# Patient Record
Sex: Male | Born: 1935
Health system: Southern US, Community
[De-identification: ages and names within clinical notes are randomized; demographics above are authoritative.]

## PROBLEM LIST (undated history)

## (undated) DIAGNOSIS — E860 Dehydration: Secondary | ICD-10-CM

## (undated) DIAGNOSIS — D591 Autoimmune hemolytic anemia, unspecified: Secondary | ICD-10-CM

## (undated) DIAGNOSIS — E7800 Pure hypercholesterolemia, unspecified: Secondary | ICD-10-CM

## (undated) DIAGNOSIS — D72829 Elevated white blood cell count, unspecified: Secondary | ICD-10-CM

## (undated) DIAGNOSIS — B029 Zoster without complications: Secondary | ICD-10-CM

## (undated) DIAGNOSIS — H3320 Serous retinal detachment, unspecified eye: Secondary | ICD-10-CM

## (undated) DIAGNOSIS — G4700 Insomnia, unspecified: Secondary | ICD-10-CM

## (undated) DIAGNOSIS — I10 Essential (primary) hypertension: Secondary | ICD-10-CM

## (undated) DIAGNOSIS — C61 Malignant neoplasm of prostate: Secondary | ICD-10-CM

## (undated) DIAGNOSIS — D693 Immune thrombocytopenic purpura: Secondary | ICD-10-CM

## (undated) HISTORY — DX: Elevated white blood cell count, unspecified: D72.829

## (undated) HISTORY — DX: Autoimmune hemolytic anemia, unspecified: D59.10

## (undated) HISTORY — DX: Serous retinal detachment, unspecified eye: H33.20

## (undated) HISTORY — PX: OTHER SURGICAL HISTORY: SHX169

## (undated) HISTORY — PX: PROSTATE SURGERY: SHX751

## (undated) HISTORY — DX: Insomnia, unspecified: G47.00

## (undated) HISTORY — PX: EYE SURGERY: SHX253

## (undated) HISTORY — PX: JOINT REPLACEMENT: SHX530

## (undated) HISTORY — PX: APPENDECTOMY: SHX54

## (undated) HISTORY — PX: HERNIA REPAIR: SHX51

## (undated) HISTORY — DX: Other autoimmune hemolytic anemias: D59.1

---

## 2005-05-18 ENCOUNTER — Ambulatory Visit: Payer: Self-pay | Admitting: Unknown Physician Specialty

## 2005-09-06 ENCOUNTER — Ambulatory Visit: Payer: Self-pay | Admitting: Unknown Physician Specialty

## 2005-09-18 ENCOUNTER — Ambulatory Visit: Payer: Self-pay | Admitting: Unknown Physician Specialty

## 2005-10-03 ENCOUNTER — Emergency Department: Payer: Self-pay | Admitting: Emergency Medicine

## 2005-10-28 ENCOUNTER — Emergency Department: Payer: Self-pay | Admitting: Otolaryngology

## 2005-11-24 ENCOUNTER — Ambulatory Visit: Payer: Self-pay | Admitting: Unknown Physician Specialty

## 2007-02-25 ENCOUNTER — Ambulatory Visit: Payer: Self-pay | Admitting: Urology

## 2007-02-28 ENCOUNTER — Ambulatory Visit: Payer: Self-pay | Admitting: Urology

## 2008-01-06 ENCOUNTER — Inpatient Hospital Stay: Payer: Self-pay | Admitting: Internal Medicine

## 2008-03-26 ENCOUNTER — Emergency Department: Payer: Self-pay | Admitting: Emergency Medicine

## 2009-10-06 ENCOUNTER — Inpatient Hospital Stay: Payer: Self-pay | Admitting: Unknown Physician Specialty

## 2009-12-13 ENCOUNTER — Ambulatory Visit: Payer: Self-pay | Admitting: Unknown Physician Specialty

## 2012-06-11 DIAGNOSIS — H3321 Serous retinal detachment, right eye: Secondary | ICD-10-CM | POA: Insufficient documentation

## 2012-06-11 DIAGNOSIS — Z970 Presence of artificial eye: Secondary | ICD-10-CM | POA: Insufficient documentation

## 2012-06-18 ENCOUNTER — Ambulatory Visit: Payer: Self-pay | Admitting: Internal Medicine

## 2012-07-30 DIAGNOSIS — C61 Malignant neoplasm of prostate: Secondary | ICD-10-CM | POA: Insufficient documentation

## 2014-04-09 DIAGNOSIS — S46219A Strain of muscle, fascia and tendon of other parts of biceps, unspecified arm, initial encounter: Secondary | ICD-10-CM

## 2014-04-09 HISTORY — DX: Strain of muscle, fascia and tendon of other parts of biceps, unspecified arm, initial encounter: S46.219A

## 2014-08-27 DIAGNOSIS — E7800 Pure hypercholesterolemia, unspecified: Secondary | ICD-10-CM | POA: Insufficient documentation

## 2014-12-08 ENCOUNTER — Observation Stay: Payer: Self-pay | Admitting: Internal Medicine

## 2014-12-08 LAB — COMPREHENSIVE METABOLIC PANEL
ALK PHOS: 74 U/L
ALT: 23 U/L
Albumin: 3.3 g/dL — ABNORMAL LOW (ref 3.4–5.0)
Anion Gap: 7 (ref 7–16)
BILIRUBIN TOTAL: 0.9 mg/dL (ref 0.2–1.0)
BUN: 17 mg/dL (ref 7–18)
CREATININE: 1.08 mg/dL (ref 0.60–1.30)
Calcium, Total: 8.6 mg/dL (ref 8.5–10.1)
Chloride: 102 mmol/L (ref 98–107)
Co2: 27 mmol/L (ref 21–32)
EGFR (African American): >60
EGFR (Non-African Amer.): >60
Glucose: 122 mg/dL — ABNORMAL HIGH (ref 65–99)
OSMOLALITY: 275 (ref 275–301)
Potassium: 3.2 mmol/L — ABNORMAL LOW (ref 3.5–5.1)
SGOT(AST): 23 U/L (ref 15–37)
SODIUM: 136 mmol/L (ref 136–145)
Total Protein: 6.8 g/dL (ref 6.4–8.2)

## 2014-12-08 LAB — URINALYSIS, COMPLETE
BLOOD: NEGATIVE
Bacteria: NONE SEEN
Bilirubin,UR: NEGATIVE
Glucose,UR: NEGATIVE mg/dL (ref 0–75)
KETONE: NEGATIVE
Leukocyte Esterase: NEGATIVE
NITRITE: NEGATIVE
PH: 7 (ref 4.5–8.0)
PROTEIN: NEGATIVE
RBC,UR: NONE SEEN /HPF (ref 0–5)
SPECIFIC GRAVITY: 1.043 (ref 1.003–1.030)
Squamous Epithelial: NONE SEEN
WBC UR: NONE SEEN /HPF (ref 0–5)

## 2014-12-08 LAB — TROPONIN I
Troponin-I: <0.02 ng/mL
Troponin-I: <0.02 ng/mL

## 2014-12-08 LAB — LIPID PANEL
CHOLESTEROL: 131 mg/dL (ref 0–200)
HDL: 47 mg/dL (ref 40–60)
LDL CHOLESTEROL, CALC: 65 mg/dL (ref 0–100)
Triglycerides: 93 mg/dL (ref 0–200)
VLDL CHOLESTEROL, CALC: 19 mg/dL (ref 5–40)

## 2014-12-08 LAB — CBC
HCT: 35.4 % — ABNORMAL LOW (ref 40.0–52.0)
HGB: 12.2 g/dL — ABNORMAL LOW (ref 13.0–18.0)
MCH: 35.8 pg — AB (ref 26.0–34.0)
MCHC: 34.5 g/dL (ref 32.0–36.0)
MCV: 104 fL — ABNORMAL HIGH (ref 80–100)
Platelet: 61 10*3/uL — ABNORMAL LOW (ref 150–440)
RBC: 3.42 10*6/uL — ABNORMAL LOW (ref 4.40–5.90)
RDW: 12.3 % (ref 11.5–14.5)
WBC: 9.9 10*3/uL (ref 3.8–10.6)

## 2014-12-08 LAB — MAGNESIUM: MAGNESIUM: 2.1 mg/dL

## 2014-12-08 LAB — TSH: Thyroid Stimulating Horm: 0.35 u[IU]/mL — ABNORMAL LOW

## 2014-12-09 LAB — CBC WITH DIFFERENTIAL/PLATELET
Basophil #: 0 10*3/uL (ref 0.0–0.1)
Basophil %: 0.5 %
EOS ABS: 0.1 10*3/uL (ref 0.0–0.7)
Eosinophil %: 1.9 %
HCT: 32.8 % — ABNORMAL LOW (ref 40.0–52.0)
HGB: 11.3 g/dL — AB (ref 13.0–18.0)
Lymphocyte #: 0.9 10*3/uL — ABNORMAL LOW (ref 1.0–3.6)
Lymphocyte %: 12.5 %
MCH: 36.5 pg — AB (ref 26.0–34.0)
MCHC: 34.5 g/dL (ref 32.0–36.0)
MCV: 106 fL — AB (ref 80–100)
MONOS PCT: 8 %
Monocyte #: 0.6 x10 3/mm (ref 0.2–1.0)
NEUTROS PCT: 77.1 %
Neutrophil #: 5.8 10*3/uL (ref 1.4–6.5)
PLATELETS: 55 10*3/uL — AB (ref 150–440)
RBC: 3.09 10*6/uL — ABNORMAL LOW (ref 4.40–5.90)
RDW: 12.4 % (ref 11.5–14.5)
WBC: 7.5 10*3/uL (ref 3.8–10.6)

## 2014-12-09 LAB — BASIC METABOLIC PANEL
Anion Gap: 5 — ABNORMAL LOW (ref 7–16)
BUN: 15 mg/dL (ref 7–18)
CO2: 27 mmol/L (ref 21–32)
CREATININE: 0.88 mg/dL (ref 0.60–1.30)
Calcium, Total: 8.4 mg/dL — ABNORMAL LOW (ref 8.5–10.1)
Chloride: 105 mmol/L (ref 98–107)
GLUCOSE: 95 mg/dL (ref 65–99)
Osmolality: 274 (ref 275–301)
Potassium: 3.5 mmol/L (ref 3.5–5.1)
SODIUM: 137 mmol/L (ref 136–145)

## 2014-12-09 LAB — MAGNESIUM: Magnesium: 1.9 mg/dL

## 2014-12-09 LAB — T4, FREE: FREE THYROXINE: 1.09 ng/dL (ref 0.76–1.46)

## 2014-12-21 ENCOUNTER — Ambulatory Visit: Payer: Self-pay | Admitting: Internal Medicine

## 2014-12-21 DIAGNOSIS — R55 Syncope and collapse: Secondary | ICD-10-CM

## 2014-12-21 HISTORY — DX: Syncope and collapse: R55

## 2015-01-10 ENCOUNTER — Inpatient Hospital Stay: Payer: Self-pay | Admitting: Internal Medicine

## 2015-01-18 ENCOUNTER — Ambulatory Visit: Payer: Self-pay | Admitting: Internal Medicine

## 2015-01-19 ENCOUNTER — Ambulatory Visit
Admit: 2015-01-19 | Disposition: A | Discharge status: Home / Self Care | Payer: Self-pay | Attending: Internal Medicine | Admitting: Internal Medicine

## 2015-02-12 LAB — HEPATIC FUNCTION PANEL A (ARMC)
ALT: 29 U/L
Albumin: 3.3 g/dL — ABNORMAL LOW
Alkaline Phosphatase: 48 U/L
BILIRUBIN TOTAL: 0.5 mg/dL
Bilirubin, Direct: <0.1 mg/dL
SGOT(AST): 24 U/L
Total Protein: 6.3 g/dL — ABNORMAL LOW

## 2015-02-12 LAB — CBC CANCER CENTER
BASOS ABS: 0 x10 3/mm (ref 0.0–0.1)
Basophil %: 0.1 %
Eosinophil #: 0 x10 3/mm (ref 0.0–0.7)
Eosinophil %: 0 %
HCT: 35.6 % — ABNORMAL LOW (ref 40.0–52.0)
HGB: 12.1 g/dL — ABNORMAL LOW (ref 13.0–18.0)
Lymphocyte #: 0.4 x10 3/mm — ABNORMAL LOW (ref 1.0–3.6)
Lymphocyte %: 4.2 %
MCH: 35.1 pg — AB (ref 26.0–34.0)
MCHC: 34.1 g/dL (ref 32.0–36.0)
MCV: 103 fL — ABNORMAL HIGH (ref 80–100)
MONO ABS: 0.1 x10 3/mm — AB (ref 0.2–1.0)
Monocyte %: 0.8 %
NEUTROS PCT: 94.9 %
Neutrophil #: 8.1 x10 3/mm — ABNORMAL HIGH (ref 1.4–6.5)
PLATELETS: 234 x10 3/mm (ref 150–440)
RBC: 3.45 10*6/uL — ABNORMAL LOW (ref 4.40–5.90)
RDW: 13.3 % (ref 11.5–14.5)
WBC: 8.5 x10 3/mm (ref 3.8–10.6)

## 2015-02-12 LAB — BASIC METABOLIC PANEL
Anion Gap: 6 — ABNORMAL LOW (ref 7–16)
BUN: 17 mg/dL
CALCIUM: 8.2 mg/dL — AB
CHLORIDE: 97 mmol/L — AB
CO2: 26 mmol/L
CREATININE: 0.88 mg/dL
EGFR (African American): >60
EGFR (Non-African Amer.): >60
Glucose: 168 mg/dL — ABNORMAL HIGH
POTASSIUM: 4.3 mmol/L
SODIUM: 129 mmol/L — AB

## 2015-02-17 LAB — BASIC METABOLIC PANEL
Anion Gap: 4 — ABNORMAL LOW (ref 7–16)
BUN: 17 mg/dL
CREATININE: 0.8 mg/dL
Calcium, Total: 8.6 mg/dL — ABNORMAL LOW
Chloride: 98 mmol/L — ABNORMAL LOW
Co2: 28 mmol/L
EGFR (African American): >60
EGFR (Non-African Amer.): >60
Glucose: 92 mg/dL
POTASSIUM: 4.2 mmol/L
SODIUM: 130 mmol/L — AB

## 2015-02-17 LAB — CBC CANCER CENTER
BASOS ABS: 0 x10 3/mm (ref 0.0–0.1)
Basophil %: 0.3 %
EOS ABS: 0.1 x10 3/mm (ref 0.0–0.7)
Eosinophil %: 0.8 %
HCT: 35.9 % — ABNORMAL LOW (ref 40.0–52.0)
HGB: 12.6 g/dL — ABNORMAL LOW (ref 13.0–18.0)
Lymphocyte #: 1.9 x10 3/mm (ref 1.0–3.6)
Lymphocyte %: 21.2 %
MCH: 35.6 pg — AB (ref 26.0–34.0)
MCHC: 35.1 g/dL (ref 32.0–36.0)
MCV: 101 fL — AB (ref 80–100)
MONO ABS: 0.7 x10 3/mm (ref 0.2–1.0)
Monocyte %: 8.2 %
NEUTROS ABS: 6.2 x10 3/mm (ref 1.4–6.5)
Neutrophil %: 69.5 %
Platelet: 201 x10 3/mm (ref 150–440)
RBC: 3.54 10*6/uL — ABNORMAL LOW (ref 4.40–5.90)
RDW: 13.1 % (ref 11.5–14.5)
WBC: 9 x10 3/mm (ref 3.8–10.6)

## 2015-02-19 ENCOUNTER — Ambulatory Visit
Admit: 2015-02-19 | Disposition: A | Discharge status: Home / Self Care | Payer: Self-pay | Attending: Internal Medicine | Admitting: Internal Medicine

## 2015-02-24 LAB — BASIC METABOLIC PANEL
Anion Gap: 4 — ABNORMAL LOW (ref 7–16)
BUN: 15 mg/dL
CHLORIDE: 99 mmol/L — AB
CO2: 25 mmol/L
Calcium, Total: 8.3 mg/dL — ABNORMAL LOW
Creatinine: 0.85 mg/dL
EGFR (African American): >60
EGFR (Non-African Amer.): >60
Glucose: 130 mg/dL — ABNORMAL HIGH
POTASSIUM: 3.8 mmol/L
Sodium: 128 mmol/L — ABNORMAL LOW

## 2015-02-24 LAB — CBC CANCER CENTER
BASOS PCT: 0.3 %
Basophil #: 0 x10 3/mm (ref 0.0–0.1)
EOS PCT: 0.6 %
Eosinophil #: 0 x10 3/mm (ref 0.0–0.7)
HCT: 37.2 % — AB (ref 40.0–52.0)
HGB: 12.8 g/dL — AB (ref 13.0–18.0)
Lymphocyte #: 1.9 x10 3/mm (ref 1.0–3.6)
Lymphocyte %: 25.6 %
MCH: 34.9 pg — ABNORMAL HIGH (ref 26.0–34.0)
MCHC: 34.4 g/dL (ref 32.0–36.0)
MCV: 102 fL — AB (ref 80–100)
MONO ABS: 0.6 x10 3/mm (ref 0.2–1.0)
Monocyte %: 8.6 %
NEUTROS PCT: 64.9 %
Neutrophil #: 4.9 x10 3/mm (ref 1.4–6.5)
Platelet: 199 x10 3/mm (ref 150–440)
RBC: 3.67 10*6/uL — AB (ref 4.40–5.90)
RDW: 12.9 % (ref 11.5–14.5)
WBC: 7.5 x10 3/mm (ref 3.8–10.6)

## 2015-03-03 LAB — BASIC METABOLIC PANEL
Anion Gap: 5 — ABNORMAL LOW (ref 7–16)
BUN: 17 mg/dL
CHLORIDE: 100 mmol/L — AB
CREATININE: 0.83 mg/dL
Calcium, Total: 8.5 mg/dL — ABNORMAL LOW
Co2: 27 mmol/L
GLUCOSE: 98 mg/dL
POTASSIUM: 3.8 mmol/L
SODIUM: 132 mmol/L — AB

## 2015-03-03 LAB — CBC CANCER CENTER
BASOS PCT: 0.3 %
Basophil #: 0 x10 3/mm (ref 0.0–0.1)
Eosinophil #: 0.1 x10 3/mm (ref 0.0–0.7)
Eosinophil %: 0.8 %
HCT: 37.8 % — ABNORMAL LOW (ref 40.0–52.0)
HGB: 13 g/dL (ref 13.0–18.0)
LYMPHS ABS: 1.7 x10 3/mm (ref 1.0–3.6)
LYMPHS PCT: 19.5 %
MCH: 34.5 pg — ABNORMAL HIGH (ref 26.0–34.0)
MCHC: 34.4 g/dL (ref 32.0–36.0)
MCV: 100 fL (ref 80–100)
MONO ABS: 0.6 x10 3/mm (ref 0.2–1.0)
MONOS PCT: 7.1 %
Neutrophil #: 6.1 x10 3/mm (ref 1.4–6.5)
Neutrophil %: 72.3 %
PLATELETS: 221 x10 3/mm (ref 150–440)
RBC: 3.77 10*6/uL — ABNORMAL LOW (ref 4.40–5.90)
RDW: 12.9 % (ref 11.5–14.5)
WBC: 8.5 x10 3/mm (ref 3.8–10.6)

## 2015-03-10 LAB — COMPREHENSIVE METABOLIC PANEL
AST: 24 U/L
Albumin: 3.6 g/dL
Alkaline Phosphatase: 43 U/L
Anion Gap: 5 — ABNORMAL LOW (ref 7–16)
BILIRUBIN TOTAL: 0.4 mg/dL
BUN: 16 mg/dL
CREATININE: 0.85 mg/dL
Calcium, Total: 9.1 mg/dL
Chloride: 102 mmol/L
Co2: 30 mmol/L
Glucose: 96 mg/dL
POTASSIUM: 4.8 mmol/L
SGPT (ALT): 20 U/L
Sodium: 137 mmol/L
TOTAL PROTEIN: 6.3 g/dL — AB

## 2015-03-10 LAB — CBC CANCER CENTER
BASOS PCT: 0.2 %
Basophil #: 0 x10 3/mm (ref 0.0–0.1)
Eosinophil #: 0.1 x10 3/mm (ref 0.0–0.7)
Eosinophil %: 0.6 %
HCT: 37.4 % — AB (ref 40.0–52.0)
HGB: 12.8 g/dL — ABNORMAL LOW (ref 13.0–18.0)
LYMPHS PCT: 11.7 %
Lymphocyte #: 1.2 x10 3/mm (ref 1.0–3.6)
MCH: 34.3 pg — ABNORMAL HIGH (ref 26.0–34.0)
MCHC: 34.3 g/dL (ref 32.0–36.0)
MCV: 100 fL (ref 80–100)
MONOS PCT: 4.1 %
Monocyte #: 0.4 x10 3/mm (ref 0.2–1.0)
Neutrophil #: 8.8 x10 3/mm — ABNORMAL HIGH (ref 1.4–6.5)
Neutrophil %: 83.4 %
Platelet: 202 x10 3/mm (ref 150–440)
RBC: 3.75 10*6/uL — AB (ref 4.40–5.90)
RDW: 12.9 % (ref 11.5–14.5)
WBC: 10.6 x10 3/mm (ref 3.8–10.6)

## 2015-03-17 LAB — HEPATIC FUNCTION PANEL A (ARMC)
ALBUMIN: 3.7 g/dL
ALT: 23 U/L
AST: 25 U/L
Alkaline Phosphatase: 44 U/L
Bilirubin, Direct: <0.1 mg/dL
Bilirubin,Total: 0.6 mg/dL
Total Protein: 6.7 g/dL

## 2015-03-17 LAB — BASIC METABOLIC PANEL
ANION GAP: 4 — AB (ref 7–16)
BUN: 17 mg/dL
CO2: 26 mmol/L
Calcium, Total: 8.5 mg/dL — ABNORMAL LOW
Chloride: 101 mmol/L
Creatinine: 0.9 mg/dL
EGFR (African American): >60
EGFR (Non-African Amer.): >60
Glucose: 161 mg/dL — ABNORMAL HIGH
POTASSIUM: 3.9 mmol/L
Sodium: 131 mmol/L — ABNORMAL LOW

## 2015-03-17 LAB — CBC CANCER CENTER
BASOS ABS: 0 x10 3/mm (ref 0.0–0.1)
Basophil %: 0.3 %
EOS ABS: 0 x10 3/mm (ref 0.0–0.7)
Eosinophil %: 0 %
HCT: 38.8 % — ABNORMAL LOW (ref 40.0–52.0)
HGB: 13.4 g/dL (ref 13.0–18.0)
LYMPHS ABS: 0.6 x10 3/mm — AB (ref 1.0–3.6)
Lymphocyte %: 7.3 %
MCH: 34 pg (ref 26.0–34.0)
MCHC: 34.5 g/dL (ref 32.0–36.0)
MCV: 99 fL (ref 80–100)
MONO ABS: 0.1 x10 3/mm — AB (ref 0.2–1.0)
Monocyte %: 1.5 %
Neutrophil #: 7.8 x10 3/mm — ABNORMAL HIGH (ref 1.4–6.5)
Neutrophil %: 90.9 %
PLATELETS: 208 x10 3/mm (ref 150–440)
RBC: 3.94 10*6/uL — ABNORMAL LOW (ref 4.40–5.90)
RDW: 13 % (ref 11.5–14.5)
WBC: 8.6 x10 3/mm (ref 3.8–10.6)

## 2015-03-21 NOTE — Consult Note (Signed)
PATIENT NAME:  [name], [name] MR#:  [id number] DATE OF BIRTH:  [birth year]  DATE OF CONSULTATION:  01/10/2015  REFERRING PHYSICIAN:     Leonie Douglas. Doy Hutching, MD  CONSULTING PHYSICIAN:  Camari Quintanilla R. Ma Hillock, MD  REASON FOR CONSULTATION: Thrombocytopenia.   HISTORY OF PRESENT ILLNESS: The patient is an 81 year old gentleman with a past medical history significant for hypertension, prostate cancer, BPH, hyperlipidemia, who presented to Emergency Room earlier today with complaints of persistent epistaxis. He was found to have severe thrombocytopenia with platelet count down to 2000. Repeat was about 5000. The patient also states that he had noticed some dark stools lately, but no bright red blood in stools. No blood in urine, hemoptysis, hematemesis, hematuria. He has had easy skin bruising, especially on pressure or trauma, also has been taking aspirin. The patient also does take at least 1 drink of vodka every night. He denies any known history of chronic liver disease or splenomegaly in the past. He did have a CT scan of the chest last month for a different reason,  which did not report any obvious abnormality in the liver or spleen. At that time on January 26 platelet count around 55,000 to 65,000, no prior workup has been done before. He denies any new bone pains. No fevers or night sweats. Appetite is good, no unintentional weight loss.   PAST MEDICAL HISTORY AND PAST SURGICAL HISTORY: As in HPI above.   FAMILY HISTORY: Remarkable for hypertension, heart disease. Denies hematological disorders.   SOCIAL HISTORY: Denies smoking, alcohol, or recreational drug usage. Physically active.   HOME MEDICATIONS: Lisinopril 20 mg daily, aspirin 81 mg daily, Flomax 0.4 mg daily, Avodart 0.5 mg daily, pravastatin 20 mg at bedtime.   ALLERGIES: No known drug allergies.   REVIEW OF SYSTEMS: CONSTITUTIONAL: Has some chronic fatigue on exertion. No fevers or chills. No night sweats.  HEENT: No headaches or  dizziness. No ear or jaw pain. No sinus symptoms.  CARDIAC: No angina, palpitations, orthopnea, or PND.  LUNGS: No dyspnea, cough, chest pain, or hemoptysis.  GASTROINTESTINAL: No nausea, vomiting, or diarrhea. Otherwise, as in HPI.  GENITOURINARY: No dysuria or hematuria.  MUSCULOSKELETAL: No new bone pains.   SKIN: As in HPI.  HEMATOLOGIC: As in HPI.  NEUROLOGIC: No new focal weakness, seizures, or loss of consciousness.  ENDOCRINE: No polyuria or polydipsia.   PHYSICAL EXAMINATION:  GENERAL: The patient is a moderately built and nourished individual, resting in bed, has a clamp over the nose for epistaxis. Otherwise, no acute distress and converses appropriately. No icterus. No pallor.  VITAL SIGNS: Temperature 97.9, heart rate 90, respiratory rate 20, blood pressure 154/75, O2 saturation 93% on room air.  HEENT: Normocephalic, atraumatic. Extraocular movements intact. Sclerae anicteric. Having active epistaxis. No oral thrush.  NECK: Negative for lymphadenopathy.  CARDIOVASCULAR: S1 and S2, regular. LUNGS: Show bilateral good breath sounds. No crepitations or rhonchi.  ABDOMEN: Soft. No hepatosplenomegaly clinically.  EXTREMITIES: Minor bruising. No major edema.  NEUROLOGIC: Limited exam. Cranial nerves seem intact. Moves all extremities spontaneously.  MUSCULOSKELETAL: No obvious joint redness or swelling.   LABORATORY DATA: INR 1.1, PTT 32.8. Creatinine 0.7, calcium 8.9. WBC 6200, hemoglobin 11.0, platelets 2000 and repeat was about 5000, MCV elevated at 108. WBC, differential mostly unremarkable except for mild lymphopenia.   IMPRESSION AND RECOMMENDATIONS: An 81 year old gentleman with past medical history as described above, had mild thrombocytopenia in January 2016 with platelet count in the range of 55,000 to 65,000, but no obvious bleeding symptoms.  Now admitted with severe epistaxis. Blood counts today show mild anemia, macrocytosis, severe thrombocytopenia with platelet count  down to 2000. Differential diagnosis is broad, does not have any abnormal PT, PTT to suggest coagulopathy. Possible etiologies include immune thrombocytopenia purpura (ITP) versus occult chronic liver disease versus other etiology like bone marrow disorder, or other etiology.   PLAN: To get further workup, including repeat CBC and differential, reticulocyte count, peripheral smear, B12, folate, iron study, LDH, haptoglobin, serum protein electrophoresis, HBsAg, HCV antibody, HIV antibody, direct Coombs test to look for hemolytic anemia. We will also send Direct platelet antibody test tomorrow since it cannot be sent on a weekend. We will get ultrasound of the abdomen to look for hepatosplenomegaly or cirrhosis of the liver. Given ongoing bleeding issues, we agree with platelet transfusion with steroid cover today and repeat platelet count later today after transfusion is done to assess response to it. The patient may need bone marrow biopsy evaluation later in the week. The patient has been advised to completely avoid taking alcohol, and also to hold aspirin until platelet count consistently improves to greater than 75,000. We will continue to follow.   Thank you for the referral. Please feel free to contact me if any additional questions.    ____________________________ Rhett Bannister Ma Hillock, MD srp:ts D: 01/10/2015 22:12:43 ET T: 01/10/2015 23:07:53 ET JOB#: [id number]  cc: Trevor Iha R. Ma Hillock, MD, <Dictator> Alveta Heimlich MD ELECTRONICALLY SIGNED 01/11/2015 14:53

## 2015-03-21 NOTE — Consult Note (Signed)
PATIENT NAME:  [name], [name] MR#:  [id number] DATE OF BIRTH:  1932-01-03  DATE OF CONSULTATION:  01/10/2015  REQUESTING PHYSICIAN:  Leonie Douglas. Doy Hutching, MD  CONSULTING PHYSICIAN:  Jerene Bears, MD  REASON FOR CONSULTATION: Epistaxis.   HISTORY OF PRESENT ILLNESS: The patient is an 81 year old gentleman who was admitted earlier this morning for thrombocytopenia. He had developed epistaxis last night and this morning and was seen in the Emergency Room and noted to have a platelet count of 2000. He does have a history of chronic anemia and prostate cancer and recently was hospitalized for syncope. He was cauterized twice in the Emergency Room, but now has had a recurrence of his bleeding here on the floor. He is currently being transfused with platelets.   PAST MEDICAL HISTORY: Significant for prostate cancer, hypertension, hyperlipidemia, prostatic hypertrophy and anemia.   ALLERGIES: No known drug allergies.   CURRENT MEDICATIONS:  Tylenol, bacitracin, Avodart, Zestril, Zofran, Protonix, pravastatin, and Flomax.   SOCIAL HISTORY:  Negative for any tobacco or alcohol use.   FAMILY HISTORY: Hypertension, coronary artery disease.  REVIEW OF SYSTEMS:  Positive for nosebleeds, pain in the neck, shoulders, and knees.   PHYSICAL EXAMINATION:  VITAL SIGNS:  Temperature 98.2, pulse 76, respirations 18, blood pressure 154/86, pulse oximetry is 89% on room air.  GENERAL: He is a well-nourished male in no acute distress.  EARS: EACs are clear.  NOSE: There is an epistaxis clamp in place. There is some dried blood from his right nasal cavity.  OROPHARYNX: Reveals some bleeding down the posterior oropharyngeal wall.   NECK: Supple. No lymphadenopathy or thyromegaly.   PROCEDURE:  Epistaxis control.   PREPROCEDURE DIAGNOSIS:  Epistaxis.   POSTPROCEDURE DIAGNOSIS:  Epistaxis.   DESCRIPTION OF PROCEDURE: Verbal consent was obtained. Afrin was sprayed in the patient's nasal cavities  bilaterally, and a 10 cm Merocel sponge was placed in the patient's right nasal cavity and inflated with oxymetazoline. The patient tolerated the procedure well and bleeding ceased and the patient did not have any further bleeding down the posterior oropharynx.   IMPRESSION: Epistaxis, status post packing with a Merocel sponge.   PLAN: The patient will need antibiotics for gram-positive positive coverage while the packing is in place.  I will remove the packing on Thursday of this next week. If the patient is still hospitalized at that time , if you could please re-page me.  If not, please make a followup appointment for Thursday morning for packing removal at [facility]. The patient will require nasal saline sprays 3 times a day as long as the packing is in place as well.   ____________________________ Jerene Bears, MD ccv:LT D: 01/10/2015 16:48:30 ET T: 01/10/2015 17:33:38 ET JOB#: [id number]  cc: Jerene Bears, MD, <Dictator> Jerene Bears MD ELECTRONICALLY SIGNED 02/10/2015 9:54

## 2015-03-21 NOTE — Consult Note (Signed)
PATIENT NAME:  [name], QUAINTANCE MR#:  [id number] DATE OF BIRTH:  Nov 09, 1932  DATE OF CONSULTATION:  12/08/2014  REFERRING PHYSICIAN:   CONSULTING PHYSICIAN:  Leotis Pain, MD  REASON FOR CONSULTATION: Syncope.  HISTORY OF PRESENT ILLNESS: An 81 year old gentleman with past medical history of prostate cancer, hypertension, hyperlipidemia, being admitted status post syncope/presyncopal episode. The patient states he was sitting down, he stood up and felt as he was losing vision, started seeing flashing lights and then near loss of vision and questionable fall. Questionable loss of consciousness possibly for a few seconds and then right away back to his baseline. No seizure-like activity. No tongue biting. No urinary incontinence. The patient has history of previous symptoms in the past described as possibility of vasovagal.  PAST MEDICAL HISTORY: Hypertension, history of prostate cancer, hyperlipidemia, history of recurrent syncope.   ALLERGIES: No known drug allergies.   PAST SURGICAL HISTORY: Right knee replacement.   FAMILY HISTORY: Father died of colorectal cancer at the age of 42.   MEDICATIONS: Include pravastatin, lisinopril, hydrochlorothiazide, Flomax, Avodart.   REVIEW OF SYSTEMS: All negative except history of syncopal episodes and past medical history of hypertension.   LABORATORY DATA: Workup reviewed. Imaging reviewed.   VITAL SIGNS: Reviewed.   NEUROLOGIC EVALUATION: The patient is awake, alert, oriented x 3, able to tell me name, date and time. Speech appears to be fluent. Extraocular movements are intact. Facial sensation intact. Facial motor is intact. Tongue is midline. Uvula elevates symmetrically. Shoulder shrug intact. Coordination: Finger-to-nose intact.   IMPRESSION: An 81 year old gentleman presenting with syncopal-like events. I do not suspect this is seizure activity. The patient has no history of seizures in the past. The patient has history of vasovagal  symptoms.   PLAN: Would obtain blood pressure monitoring lying, standing and sitting. Hydration, adjusting his antihypertensive medication, questionable additional management. No further imaging from a neurological standpoint. This case was discussed with the patient's primary team.    ____________________________ Leotis Pain, MD yz:TT D: 12/08/2014 20:43:08 ET T: 12/08/2014 21:13:42 ET JOB#: [id number]  cc: Leotis Pain, MD, <Dictator> Leotis Pain MD ELECTRONICALLY SIGNED 12/29/2014 12:03

## 2015-03-21 NOTE — Discharge Summary (Signed)
PATIENT NAME:  [name], [name] MR#:  [id number] DATE OF BIRTH:  08/15/1932  DATE OF ADMISSION:  12/08/2014 DATE OF DISCHARGE:  12/10/2014  ADMITTING DIAGNOSES:  1.  Syncope.  2.  Hypertension.  3.  Hyperlipidemia.  4.  Hypokalemia.  5.  History of prostate cancer.   DISCHARGE DIAGNOSES:  1.  Syncope secondary to orthostatic hypotension.  2.  Hypertension.  3.  Hyperlipidemia.  4.  Hypokalemia.  5.  History of prostate cancer.   CONSULTATIONS: Neurology, Leotis Pain, MD.   PROCEDURES: None.  BRIEF HISTORY AND PHYSICAL AND HOSPITAL COURSE: The patient is an 81 year old pleasant male who came into the ED for recurrent syncopal episodes. The patient was feeling dizzy and having blackout spells about 1 or 2 times in a month for several years. He was admitted back in February 2009 and at that time the syncope was thought to be from vasovagal attacks. Please review history and physical for details. He denies any headache or blurry vision at the time of admission. CT scan of the chest in the ED did not reveal any acute pulmonary embolism. The patient was evaluated by neurology, Dr. Irish Elders, regarding recurrent syncopal episodes. He has recommended IV hydration and adjusting his antihypertensive medications and no further workup. Did not recommend any further imaging from neurological standpoint. His EKG did not reveal any acute ST-T wave changes. The patient's orthostatic blood pressures were quite significant. On January 20, his lying blood pressure was 114/73. On standing it was at 121/69. At the time of admission on January 19, lying down 154/68 and which has dropped down to 128/73 on standing posture. With IV fluid, his orthostatic hypotension is resolved. Dizziness is resolved. While holding antihypertensive medications, his blood pressure was stable but elevated on January 21. The patient's hydrochlorothiazide seemed to be causing his orthostatic hypotension and it was discontinued.  Otherwise, hospital course was uneventful.   PHYSICAL EXAMINATION:  VITAL SIGNS: On January 21, temperature 97.5, pulse 78, respirations 18, blood pressure 138/78. Orthostatic blood pressures lying 134/74 with pulse 81; when standing, 145/89 with pulse 94. Pulse oximetry 95% at rest and 98% with exertion.  GENERAL APPEARANCE: Not in acute distress. Moderately built and nourished.  HEENT: Normocephalic, atraumatic. Pupils are equally reacting to light and accommodation. No scleral icterus. No conjunctival injection. No sinus tenderness. No postnasal drip. Moist mucous membranes.  NECK: Supple. No JVD. No thyromegaly. Range of motion is intact.  LUNGS: Clear to auscultation bilaterally. No accessory muscle use and no anterior chest wall tenderness on palpation.  CARDIAC: S1, S2 normal. Regular rate and rhythm. No murmurs.  GASTROINTESTINAL: Soft. Bowel sounds are positive in all 4 quadrants. Nontender, nondistended. No masses. NEUROLOGIC: Awake, alert, oriented x 3. Cranial nerves II-XII are grossly intact. Motor and sensory are intact. Reflexes are 2+.  EXTREMITIES: No edema, cyanosis, no clubbing.  SKIN: Warm to touch. Normal turgor. No rashes. No lesions.  MUSCULOSKELETAL: No joint effusion, tenderness, erythema.  PSYCHIATRIC: Normal mood and affect.  LABORATORY AND IMAGING STUDIES: January 20: BMP is normal except anion gap at 5, magnesium 1.9, total cholesterol 131, triglycerides 93, HDL is 47. LFTs are normal except albumin at 3.3. Troponin at less than 0.02 x 3. TSH 0.350. Free T4 is normal, 1.09. CBC: WBC 7.5, hemoglobin 11.3, hematocrit 32.8, platelet count is 61,000. Urinalysis: Negative nitrites and leukocyte esterase. Glucose negative. CT angiography of the chest: No evidence of acute pulmonary embolism. Opacities at the lung bases likely reflect pulmonary edema. An inflammatory process  is not excluded. Possible right thyroid nodule. Ultrasound is recommended. Carotid Doppler ultrasound:  Calcified plaque at the level of both carotid bulbs and proximal internal carotid arteries, right greater than left. No significant carotid stenosis is identified with estimated bilateral ICA stenosis of less than 50%. Ultrasound of the soft tissues of the head and neck: Normal evaluation by ultrasound of the inferior right thyroid nodule seen by CT due to substernal location of the inferior right thyroid. The visualized part of the thyroid gland is unremarkable by ultrasound.  MEDICATIONS AT THE TIME OF DISCHARGE: Avodart 0.5 mg 1 capsule p.o. once daily, Flomax 0.4 mg 1 capsule p.o. once daily in a.m., lisinopril 20 mg p.o. once daily, pravastatin 20 mg p.o. at bedtime. Hydrochlorothiazide is discontinued.   DIET: Low sodium, regular consistency.   ACTIVITY: As tolerated.  FOLLOWUP: With primary care physician in 1 to 2 weeks and neurology in 1 to 2 weeks.  Plan of care discussed in detail with the patient. He verbalized understanding of the plan.  TOTAL TIME SPENT ON THE DISCHARGE: 45 minutes.   ____________________________ Nicholes Mango, MD ag:ST D: 12/11/2014 19:43:45 ET T: 12/12/2014 02:04:22 ET JOB#: [id number]  cc: Nicholes Mango, MD, <Dictator> Leotis Pain, MD Primary care provider Nicholes Mango MD ELECTRONICALLY SIGNED 12/12/2014 16:54

## 2015-03-21 NOTE — H&P (Signed)
PATIENT NAME:  [name], [name] MR#:  [id number] DATE OF BIRTH:  May 30, 1932  DATE OF ADMISSION:  01/10/2015  REFERRING PHYSICIANS: Dr. Ok Edwards and Dr. Baldemar Lenis.   REASON FOR ADMISSION: Nosebleeds with thrombocytopenia.   HISTORY OF PRESENT ILLNESS: The patient is an 81 year old male with a history of prostate cancer and hypertension who presents to the Emergency Room with nosebleeds. While in the Emergency Room, the patient was noted at the platelet count of 2000. No history of thrombocytopenia. He has a history of chronic anemia and prostate cancer. The bleeding was stopped in the Emergency Room and he is now admitted for further evaluation.   PAST MEDICAL HISTORY: 1.  History of prostate cancer.  2.  Benign hypertension.  3.  Hyperlipidemia.  4.  Benign prostatic hypertrophy.   MEDICATIONS:  1.  Pravastatin 20 mg p.o. at bedtime.  2.  Lisinopril 20 mg p.o. daily.  3.  Aspirin 81 mg p.o. daily.  4.  Flomax 0.4 mg p.o. daily. 5.  Avodart 0.5 mg p.o. daily.   ALLERGIES: No known drug allergies.   SOCIAL HISTORY: Negative for alcohol or tobacco abuse.   FAMILY HISTORY: Positive for hypertension, coronary artery disease, but otherwise unremarkable.   REVIEW OF SYSTEMS: CONSTITUTIONAL: No fever or change in weight.  EYES: No blurred or double vision. No glaucoma.  EARS, NOSE AND THROAT: No tinnitus or hearing loss. Nosebleeds as per history of present illness. No difficulty swallowing.  RESPIRATORY: No cough or wheezing. Denies hemoptysis. No painful respiration.  CARDIOVASCULAR: No chest pain or orthopnea. No palpitations or syncope.  GASTROINTESTINAL: No nausea, vomiting, or diarrhea. No abdominal pain. No change in bowel habits.  GENITOURINARY: No dysuria or hematuria. No incontinence.  ENDOCRINE: No polyuria or polydipsia. No heat or cold intolerance.  HEMATOLOGIC: The patient denies anemia, easy bruising although he has had bleeding as per history of present illness.  LYMPHATIC: No  swollen glands.  MUSCULOSKELETAL: The patient has pain in his neck, back, shoulders, knees, and hips. No gout.  NEUROLOGIC: No numbness or migraines. Denies stroke or seizures.  PSYCHOLOGICAL: The patient denies anxiety, insomnia, or depression.   PHYSICAL EXAMINATION:  GENERAL: The patient is in no acute distress.  VITAL SIGNS: Currently remarkable for a blood pressure of 144/82, heart rate 74, respiratory rate of 18, temperature of 98.1, saturating 96% on room air.  HEENT: Normocephalic, atraumatic. Pupils equally round and reactive to light and accommodation. Extraocular movements are intact. Sclerae are anicteric. Conjunctivae are clear.  OROPHARYNX: Clear.  NECK: Supple without JVD. No adenopathy or thyromegaly is noted.  LUNGS: Clear to auscultation and percussion without wheezes, rales, or rhonchi. No dullness. Respiratory effort is normal.  CARDIAC: Regular rate and rhythm. Normal S1, S2. No significant rubs, murmurs, or gallops. PMI is nondisplaced. Chest wall is nontender.  ABDOMEN: Soft, nontender, with normoactive bowel sounds. No organomegaly or masses were appreciated. No hernias or bruits were noted.  EXTREMITIES: Without clubbing, cyanosis, or edema. Pulses were 2+ bilaterally.  SKIN: Warm and dry without rash or lesions.  NEUROLOGIC: Cranial nerves II-XII grossly intact. Deep tendon reflexes were symmetric. Motor and sensory exam is nonfocal.  PSYCHIATRIC: Revealed a patient who is alert and oriented to person, place, and time. He was cooperative and used good judgment.   LABORATORY DATA: Glucose was 112 with a BUN of 14, creatinine 0.97 with a sodium of 136, and potassium 3.9 with a GFR of greater than 60. His white count was 6.2 with a hemoglobin of 11.1  and a platelet count of 2.   ASSESSMENT:  1.  Thrombocytopenia of unclear etiology.  2.  Nosebleeds. 3.  Prostate cancer.  4.  Benign hypertension.  5.  Hyperlipidemia.   PLAN: The patient will be admitted the floor as  a full code. He will be given IV Solu-Medrol once followed by transfusion of 2 units of platelets as soon as possible. We will consult hematology urgently. He will be kept on a clear liquid diet for now. We will follow closely for signs of other bleeding as his nosebleed has currently stopped. We will continue his outpatient regimen for now. We will obtain an abdominal ultrasound to look at his spleen. Further treatment and evaluation will depend upon the patient's progress.   TOTAL TIME SPENT ON THIS PATIENT: 50 minutes.   ____________________________ Leonie Douglas Doy Hutching, MD jds:mc D: 01/10/2015 12:10:38 ET T: 01/10/2015 12:17:12 ET JOB#: [id number]  cc: Leonie Douglas. Doy Hutching, MD, <Dictator> Derinda Late, MD  Cordie Beazley Lennice Sites MD ELECTRONICALLY SIGNED 01/10/2015 17:41

## 2015-03-21 NOTE — Consult Note (Signed)
HEMATOLOGY followup - no epistaxis today. Denies other new bleeding issues.no fevers. Eating well.sitting in bed, alert and oriented, no acute distress. Pallor +.           vitals - 97.5, 62, 20, 166/83, 92% on room air          HEENT - no oral thrush or petechiae          lungs - bilateral good breath sounds, no creps          abd - soft, NT Hb 8.5, WBC 10000, ANC 8000, platelets 91K, Cr 0.84. Recent Haptoglobin <10, serum LDH elevated at 397, retic 4.6%, ab-retic 0.1331, direct Coombs test positive Serum ferritin 441, serum iron low at 57 but TIBC is low at 246 and iron saturation normal at 23%. Otherwise serum B12, folate, SIEP, HBsAg, HCV Ab, HIV Ab all unremarkable.  Radiology: 01/11/15 - US abdomen. IMPRESSION:  No morphologic findings of cirrhosis. No evidence of hepatosplenomegaly. Trace right pleural effusion. Marrow Biopsy report is pending.  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 Bone marrow biopsy report is pending but preliminarily no acute leukemia, will await final report. Platelet count today is significantly improved at 91K, anemia is steady at 8.5 g hemoglobin. The patient has received IVIG (intravenous immune globulin 400 mg/kg/day x 2 doses on 2/23 and 2/24), also is on IV Solu-Medrol. Given excellent improvement in platelet count and clinically doing study, agree with discharging him home today on prednisone 80 mg by mouth daily. Will follow-up at [facility] on Monday, February 29 with repeat CBC and make further plan of management. In between visits, patient has been advised to call or come to the ER in case of fevers, chills, bleeding, acute sickness or new symptoms. Patient and wife present were explained above details, they are agreeable to this plan.   Electronic Signatures: Jonn Shingles (MD)  (Signed on 25-Feb-16  16:53)  Authored  Last Updated: 25-Feb-16 16:53 by Jonn Shingles (MD)

## 2015-03-21 NOTE — H&P (Signed)
PATIENT NAME:  [name], EDMONSTON MR#:  [id number] DATE OF BIRTH:  22-Sep-1932  DATE OF ADMISSION:  12/08/2014  PRIMARY CARE PHYSICIAN: Derinda Late, MD  EMERGENCY ROOM PHYSICIAN: Algis Liming. Jimmye Norman, MD   CHIEF COMPLAINT: Passing out.   HISTORY OF PRESENT ILLNESS: The patient is an 81 year old male with a known history of prostate cancer, hypertension, and hyperlipidemia, who is being admitted for recurrent syncope/presyncope. The patient reports feeling dizzy and having blackout spell about once or twice a month for several years. He had been admitted back in February 2009 for the same reason, it was thought to be more vasovagal syncope. The patient has been able to function normally, but lately, he has been feeling more dizzy. This morning, he was putting out the garbage can, felt dizzy, went back to bring his recycling garbage to the street, when he almost presyncopized. He lay down on the floor, and the next thing he remembers is his wife standing right next to him. He did hit the garbage can on his shin on his left leg, and he woke up after a few seconds. He denies hitting his head, as he was able to hold on and sit down; although, he does feel that he might have lost a couple of seconds, but he does not recall exactly when. He denies any headache, blurry vision, or any other symptoms associated with this.   PAST MEDICAL HISTORY: 1. Hypertension.  2. History of prostate cancer.  3. Hyperlipidemia.  4. History of recurrent syncope.   ALLERGIES: No known drug allergies.   PAST SURGICAL HISTORY: Right knee replacement.   FAMILY HISTORY: Father died of colorectal cancer at the age of 56. Father had multiple sclerosis also.   SOCIAL HISTORY: No smoking. No alcohol. He lives with his wife. He is not working.   MEDICATIONS AT HOME: 1. Pravastatin 20 mg p.o. daily.  2. Lisinopril 20 mg p.o. daily.  3. Hydrochlorothiazide 25 mg p.o. daily.  4. Flomax 0.4 mg p.o. daily.  5. Avodart 0.5 mg  p.o. daily.   REVIEW OF SYSTEMS:  CONSTITUTIONAL: No fever, fatigue, weakness.  EYES: No blurred or double vision.  EARS, NOSE, AND THROAT: No tinnitus or ear pain.  RESPIRATORY: No cough, wheezing, hemoptysis.  CARDIOVASCULAR: No chest pain, orthopnea, edema.  GASTROINTESTINAL: No nausea, vomiting, diarrhea.  GENITOURINARY: No dysuria or hematuria.  ENDOCRINE: No polyuria or nocturia.  HEMATOLOGY: No anemia or easy bruising.  SKIN: He has minimal laceration on his left shin.  MUSCULOSKELETAL: No joint effusion or tenderness.  GENITOURINARY: History of prostate cancer.  NEUROLOGIC: No tingling, numbness, weakness.  PSYCHIATRIC: No history of anxiety or depression.   PHYSICAL EXAMINATION: VITAL SIGNS: Temperature 97.4, heart rate 57 per minute, respirations 18 per minute, blood pressure 131/72, saturating 95% on room air.  GENERAL: The patient is an 81 year old male lying in the bed comfortably, without any acute distress.  EYES: Pupils equal, round, reactive to light and accommodation. No scleral icterus. Extraocular muscles intact.  HEENT: Head atraumatic, normocephalic. Oropharynx and nasopharynx clear.  NECK: Supple. No jugular venous distention. No thyroid enlargement, no tenderness.  LUNGS: Clear to auscultation bilaterally. No wheezing, rales, rhonchi, crepitation.  CARDIOVASCULAR: S1, S2 normal. No murmurs, rubs, or gallops.  ABDOMEN: Soft, nontender, nondistended. Bowel sounds present, no organomegaly or mass.  EXTREMITIES: No pedal edema, cyanosis, or clubbing.  NEUROLOGIC: Cranial nerves II through XII Intact. Muscle strength 5/5 in all extremities. Sensation intact.  PSYCHIATRIC: The patient is alert and oriented x 3.  SKIN: He has a minimal laceration on his left shin. No bleeding: No signs of infection around MUSCULOSKELETAL: No joint effusion or tenderness.   DIAGNOSTIC STUDIES: EKG showed sinus bradycardia, no acute ST or T changes, heart rate of 57 per minute. Chest  x-ray showed trace bilateral pleural effusion, possible bibasilar atelectasis versus multilobar pneumonia. CT scan chest in the ED showed no acute pulmonary embolism. Possible pulmonary edema, inflammatory process not excluded. Possible right thyroid nodule.   IMPRESSION AND PLAN: 1. Syncope/presyncope, recurrent in nature. We will get orthostatic vitals, obtain TSH, obtain 2D echocardiogram, carotid Dopplers. Consult neurology. Monitor him on telemetry. Check TSH. This is likely vasovagal in nature.  2. Hypertension. Considering his advanced age, we will probably go ahead and hold his blood pressure medication, including lisinopril and hydrochlorothiazide, as this certainly could be contributing to him to feeling dizzy.  3. Hyperlipidemia. We will continue statin. Check fasting lipid profile.  4. Hypokalemia. We will replete and recheck. Check magnesium. Is likely due to him being on diuretic.  5. History of prostate cancer. We will hold avodart and Flomax at this time, as those can be contributing to orthostatic hypotension and him feeling dizzy also.   CODE STATUS: Full code.   TOTAL TIME TAKING CARE OF THIS PATIENT: 45 minutes.    ____________________________ Lucina Mellow. Manuella Ghazi, MD vss:mw D: 12/08/2014 14:49:55 ET T: 12/08/2014 15:25:31 ET JOB#: [id number]  cc: Sivan Quast S. Manuella Ghazi, MD, <Dictator> Derinda Late, MD   Lucina Mellow [facility] MD ELECTRONICALLY SIGNED 12/10/2014 10:14

## 2015-03-21 NOTE — Consult Note (Signed)
HEMATOLOGY followup note - had small epistaxis today. Denies other new bleeding issues.no fevers. Denies new pain.sitting in bed, alert and oriented, no acute distress. Mild pallor.           vitals - 98.2, 64, 20, 122/62, 92% on room air          HEENT - no oral thrush or petechiae          lungs - bilateral good breath sounds, no rhonchi          abd - soft, NT Hb 8.2, WBC 6900, ANC 5600, platelets 2K, Cr 0.88, calcium 8.1. 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 Otherwise serum B12, folate, SIEP, HBsAg, HCV Ab, HIV Ab all unremarkable.  Radiology: 01/11/15 - US abdomen. IMPRESSION:  No morphologic findings of cirrhosis. No evidence of hepatosplenomegaly. Trace right pleural effusion. 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 Bone marrow biopsy done yesterday, report is pending but preliminarily no acute leukemia, we will await final report. Platelet count today has again dropped to 2K and he still has minor epistaxis. Will increase dose of IV Solu-Medrol, start on IVIG (intravenous immune globulin 400 mg/kg dose today, premedicated with Tylenol and Benadryl). Agree with platelet transfusion following IVIG infusion and monitor platelet count. May need to continue on IVIG for few days. Patient otherwise is clinically doing fairly steady. Patient and son present were explained above details, they are agreeable to this plan. Will continue to follow.   Electronic Signatures: Jonn Shingles (MD)  (Signed on 23-Feb-16 21:47)  Authored  Last Updated: 23-Feb-16 21:47 by Jonn Shingles (MD)

## 2015-03-21 NOTE — Discharge Summary (Signed)
PATIENT NAME:  [name], [name] MR#:  [id number] DATE OF BIRTH:  [birth year]  DATE OF ADMISSION:  01/10/2015 DATE OF DISCHARGE:  01/14/2015  DISCHARGE DIAGNOSES: 1. 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 Nasal packing done. 2. Hypertension.  3. Hyperlipidemia.  4. History of prostate cancer.  5. Black stool; need to follow stool guaiac in office in the next 3 to 4 days. If positive, then needs gastroenterology followup in the office.  DISCHARGE MEDICATIONS: 1.  Avodart capsule 0.5 mg oral once a day.  2.  Flomax 0.4 mg once a day.  3.  Lisinopril 20 mg once a day.  4.  Pravastatin 20 mg once a day.  5.  Acetaminophen 325 mg oral 2 tablets every 4 hours as needed for mild pain.  6.  Sodium chloride 2 sprays 4 times a day.  7.  Amoxicillin 500 mg oral tablet for 2 days.  8.  Prednisone 20 mg oral tablet once a day for 14 days.  9.  Zolpidem 5 mg oral tablet once a day as needed for insomnia.   DIET ON DISCHARGE: Low-sodium, low-fat, low-cholesterol, regular consistency diet.   DISCHARGE INSTRUCTIONS: Advised to follow in 1 to 2 days in [place] with Dr. Ma Hillock, and his primary care physician, Dr. Baldemar Lenis.  HISTORY OF PRESENTING ILLNESS: An 81 year old male with history of prostate cancer and hypertension, presented to the Emergency Room with nose bleed. While in the ER, he was noted to have platelet count of 2000. He had nasal packing done in the ER and bleeding stopped by that and was given for admission for further management.     HOSPITAL COURSE AND STAY:  1. Thrombocytopenia - that was guided by Dr. Ma Hillock. He was given 2 transfusions on admission. The next day his platelet count went up to 14,000 and he did not have any active bleeding. We continued monitoring and platelet count went down to 2000 again the next day and so he was started IV IG by hematologist and bone marrow biopsy was done. He was given one more unit of platelets and  his platelet count the next day was 48,000. He was given a second dose of IV IG and then on the fifth day of admission his platelet count was 91,000.  With that platelet count level he was discharged home and advised to follow with the [facility] regularly. 2. Nosebleed. ENT did packing in the nose and after a few days on discharge the packing was removed.  3. Benign hypertension. It was stable in the hospital. 4. Hyperlipidemia. Continued his medications.  5. Black stool. He had gastric ulcers in the past, 5 years ago.    DIAGNOSTIC DATA: On admission platelet count was 2000, WBC 6.2, hemoglobin 11.1. Glucose 112, BUN 14, creatinine 0.97, sodium 136 and potassium 3.9.   Platelet count, as mentioned above, was stable and then gradually started going up . On the 24th of February it was 54 and on the 25th of February it was 48.  TIME SPENT:  Total time spent on discharge was 40 min.  ____________________________ Ceasar Lund. Anselm Jungling, MD vgv:[id number] D: 01/17/2015 21:10:00 ET T: 01/18/2015 07:15:08 ET JOB#: [id number]  cc: Sandeep R. Ma Hillock, MD Derinda Late, MD Vaughan Basta MD ELECTRONICALLY SIGNED 02/01/2015 13:00

## 2015-03-22 DIAGNOSIS — Z862 Personal history of diseases of the blood and blood-forming organs and certain disorders involving the immune mechanism: Secondary | ICD-10-CM

## 2015-03-22 HISTORY — DX: Personal history of diseases of the blood and blood-forming organs and certain disorders involving the immune mechanism: Z86.2

## 2015-03-23 ENCOUNTER — Other Ambulatory Visit: Payer: Self-pay

## 2015-03-23 ENCOUNTER — Encounter: Payer: Self-pay | Admitting: *Deleted

## 2015-03-23 ENCOUNTER — Emergency Department
Admission: EM | Admit: 2015-03-23 | Discharge: 2015-03-24 | Disposition: A | Discharge status: Home / Self Care | Payer: PPO | Attending: Emergency Medicine | Admitting: Emergency Medicine

## 2015-03-23 DIAGNOSIS — R112 Nausea with vomiting, unspecified: Secondary | ICD-10-CM | POA: Insufficient documentation

## 2015-03-23 DIAGNOSIS — R1110 Vomiting, unspecified: Secondary | ICD-10-CM | POA: Diagnosis present

## 2015-03-23 DIAGNOSIS — F419 Anxiety disorder, unspecified: Secondary | ICD-10-CM | POA: Diagnosis not present

## 2015-03-23 DIAGNOSIS — R21 Rash and other nonspecific skin eruption: Secondary | ICD-10-CM | POA: Insufficient documentation

## 2015-03-23 DIAGNOSIS — I10 Essential (primary) hypertension: Secondary | ICD-10-CM | POA: Insufficient documentation

## 2015-03-23 DIAGNOSIS — M79605 Pain in left leg: Secondary | ICD-10-CM | POA: Insufficient documentation

## 2015-03-23 HISTORY — DX: Pure hypercholesterolemia, unspecified: E78.00

## 2015-03-23 HISTORY — DX: Dehydration: E86.0

## 2015-03-23 HISTORY — DX: Zoster without complications: B02.9

## 2015-03-23 HISTORY — DX: Immune thrombocytopenic purpura: D69.3

## 2015-03-23 HISTORY — DX: Essential (primary) hypertension: I10

## 2015-03-23 HISTORY — DX: Malignant neoplasm of prostate: C61

## 2015-03-23 LAB — CBC WITH DIFFERENTIAL/PLATELET
BASOS ABS: 0 10*3/uL (ref 0–0.1)
Basophils Relative: 0 %
EOS ABS: 0 10*3/uL (ref 0–0.7)
Eosinophils Relative: 0 %
HCT: 41.7 % (ref 40.0–52.0)
Hemoglobin: 14.2 g/dL (ref 13.0–18.0)
Lymphocytes Relative: 8 %
Lymphs Abs: 0.8 10*3/uL — ABNORMAL LOW (ref 1.0–3.6)
MCH: 33.8 pg (ref 26.0–34.0)
MCHC: 34.1 g/dL (ref 32.0–36.0)
MCV: 99 fL (ref 80.0–100.0)
Monocytes Absolute: 0.6 10*3/uL (ref 0.2–1.0)
Monocytes Relative: 6 %
Neutro Abs: 8.4 10*3/uL — ABNORMAL HIGH (ref 1.4–6.5)
PLATELETS: 161 10*3/uL (ref 150–440)
RBC: 4.21 MIL/uL — AB (ref 4.40–5.90)
RDW: 12.7 % (ref 11.5–14.5)
WBC: 9.7 10*3/uL (ref 3.8–10.6)

## 2015-03-23 MED ORDER — SODIUM CHLORIDE 0.9 % IV SOLN
1000.0000 mL | Freq: Once | INTRAVENOUS | Status: AC
  Administered 2015-03-23: 1000 mL via INTRAVENOUS

## 2015-03-23 NOTE — ED Notes (Signed)
Pt developed shingles on Sunday night saw pcp on Monday and started meds on Monday. Pt then developed nausea and vomiting today pt talked with md and told to come to the ed for eval. Pt does have history of ITP. Vomit this afternoon was dark and there was concern for blood being present

## 2015-03-23 NOTE — ED Provider Notes (Signed)
[facility] Emergency Department Provider Note    ____________________________________________  Time seen: 2310  I have reviewed the triage vital signs and the nursing notes.   HISTORY  Chief Complaint Nausea; Emesis; and Herpes Zoster      HPI [name]. is a 81 y.o. male who comes in today with dark vomiting. The patient reports that months ago he was diagnosed with ITP after having some low platelet counts. The patient reports that he developed shingles and saw his primary care physician and was given medication for the shingles yesterday. The patient reports that today he has been feeling very bad. He has had multiple episodes of vomiting. He denies any episodes of diarrhea or abdominal pain. The patient's wife reports that he has been dizzy. He was given some Zofran but at approximately 1815 the patient had a large amount of emesis that was dark brown in color. Given the patient's history of ITP the patient's wife called Dr. Ma Hillock and was told to come into the emergency department for further evaluation. The patient reports that he has not drank or eaten much today. For his shingles he was given valacyclovir and he has been on prednisone since February. The patient reports he took a pain medication yesterday and is unsure if that had anything to do with the vomiting. The patient denies any other sick contacts. The patient reports that nothing has made his nausea and vomiting better or worse. The patient reports he is in a 0 out of 10 pain. The patient reports that this morning he ate cereal and vomited all back up. He has not had any fever or chest pain. He denies any weakness or body aches. The patient is here for further evaluation.     Past Medical History  Diagnosis Date  . ITP (idiopathic thrombocytopenic purpura)   . Shingles   . Dehydration   . Hypertension   . Prostate cancer   . Hypercholesteremia     There are no active problems to  display for this patient.   Past Surgical History  Procedure Laterality Date  . Hernia repair    . Eye surgery    . Artificial left eye    . Joint replacement      No current outpatient prescriptions on file.  Allergies Review of patient's allergies indicates no known allergies.  History reviewed. No pertinent family history.  Social History History  Substance Use Topics  . Smoking status: Never Smoker   . Smokeless tobacco: Not on file  . Alcohol Use: No    Review of Systems  Constitutional: Negative for fever. Eyes: Negative for visual changes. ENT: Negative for sore throat. Cardiovascular: Negative for chest pain. Respiratory: Negative for shortness of breath. Gastrointestinal: Positive for vomiting but negative for abdominal pain and diarrhea. Genitourinary: Negative for dysuria. Musculoskeletal: Negative for back pain but positive for left leg pain yesterday. Skin: Positive for rash in dermatomal distribution on the left chest consistent with shingles. Neurological: Positive for dizziness but negative for headaches, focal weakness or numbness. Psychiatric: Positive for anxiety   10-point ROS otherwise negative.  ____________________________________________   PHYSICAL EXAM:  VITAL SIGNS: ED Triage Vitals  Enc Vitals Group     BP 03/23/15 2043 131/81 mmHg     Pulse Rate 03/23/15 2043 80     Resp 03/23/15 2257 18     Temp 03/23/15 2043 97.9 F (36.6 C)     Temp Source 03/23/15 2043 Oral  SpO2 03/23/15 2043 97 %     Weight 03/23/15 2043 165 lb (74.844 kg)     Height 03/23/15 2043 5\' 9"  (1.753 m)     Head Cir --      Peak Flow --      Pain Score --      Pain Loc --      Pain Edu? --      Excl. in [place]? --      Constitutional: Alert and oriented. Well appearing and in no distress. Eyes: Conjunctivae are normal. PERRL. Normal extraocular movements. ENT   Head: Normocephalic and atraumatic.   Nose: No congestion/rhinnorhea.    Mouth/Throat: Mucous membranes are moist.   Neck: No stridor. Hematological/Lymphatic/Immunilogical: No cervical lymphadenopathy. Cardiovascular: Normal rate, regular rhythm. Normal and symmetric distal pulses are present in all extremities. No murmurs, rubs, or gallops. Respiratory: Normal respiratory effort without tachypnea nor retractions. Breath sounds are clear and equal bilaterally. No wheezes/rales/rhonchi. Gastrointestinal: Soft and nontender. No distention. No abdominal bruits. There is no CVA tenderness. Genitourinary: Deferred Rectal: Nontender light brown stool no gross blood Hemoccult negative control negative Musculoskeletal: Nontender with normal range of motion in all extremities. No joint effusions.  No lower extremity tenderness nor edema. Neurologic:  Normal speech and language. No gross focal neurologic deficits are appreciated. Speech is normal. No gait instability. Skin:  Skin is warm, dry and intact. No rash noted. Psychiatric: Mood and affect are normal. Speech and behavior are normal. Patient exhibits appropriate insight and judgment.  ____________________________________________    LABS (pertinent positives/negatives)  Urine with 2+ hemoglobin and trace ketones CBC with platelets of 161 white blood cell count 9.7 hemoglobin 14.2 and hematocrit 41.7 CMP sodium 1:30 chloride 95 glucose 107  ____________________________________________   EKG  ED ECG REPORT   Date: 03/24/2015  EKG Time: 2311  Rate: Normal sinus rhythm  Rhythm: normal EKG, normal sinus rhythm, unchanged from previous tracings  Axis: Normal  Intervals:none  ST&T Change: No ST segment elevation or flipped T waves  ____________________________________________    RADIOLOGY  None  ____________________________________________   PROCEDURES  Procedure(s) performed: None  Critical Care performed: No  ____________________________________________   INITIAL IMPRESSION / ASSESSMENT  AND PLAN / ED COURSE  Pertinent labs & imaging results that were available during my care of the patient were reviewed by me and considered in my medical decision making (see chart for details).  The patient is an 81 year old male who comes in today with 3 episodes of vomiting with no abdominal pain or diarrhea. The patient's physician is concerned as the patient does have a diagnosis of ITP and did have some brown appearing emesis. We will check the patient's blood work and determine if the patient's platelets are low. We will then give the patient some fluids as well as have a by mouth trial and then determine the patient's disposition at this time. The patient is comfortable and in no acute distress.  ----------------------------------------- 1:45 AM on 03/24/2015 -----------------------------------------  The patient received 1 L of normal saline and has blood work that is unremarkable. At this time the patient feels well. His previous blood work shows sodium levels that are similar to the current level. The patient was able to drink some ginger ale and eat crackers without any emesis. I will discharge the patient home to follow up with Dr. Ma Hillock or his primary care physician. The patient and his wife agree with the plan as reviewed for the stated and will continue his treatment for  shingles. ____________________________________________   FINAL CLINICAL IMPRESSION(S) / ED DIAGNOSES  Final diagnoses:  Non-intractable vomiting with nausea, vomiting of unspecified type     Loney Hering, MD 03/24/15 0151

## 2015-03-24 LAB — URINALYSIS COMPLETE WITH MICROSCOPIC (ARMC ONLY)
Bacteria, UA: NONE SEEN
Bilirubin Urine: NEGATIVE
Glucose, UA: NEGATIVE mg/dL
LEUKOCYTES UA: NEGATIVE
Nitrite: NEGATIVE
PH: 7 (ref 5.0–8.0)
Protein, ur: NEGATIVE mg/dL
Specific Gravity, Urine: 1.01 (ref 1.005–1.030)
Squamous Epithelial / LPF: NONE SEEN

## 2015-03-24 LAB — COMPREHENSIVE METABOLIC PANEL
ALBUMIN: 3.7 g/dL (ref 3.5–5.0)
ALK PHOS: 55 U/L (ref 38–126)
ALT: 19 U/L (ref 17–63)
ANION GAP: 9 (ref 5–15)
AST: 22 U/L (ref 15–41)
BILIRUBIN TOTAL: 0.8 mg/dL (ref 0.3–1.2)
BUN: 15 mg/dL (ref 6–20)
CHLORIDE: 95 mmol/L — AB (ref 101–111)
CO2: 26 mmol/L (ref 22–32)
Calcium: 8.8 mg/dL — ABNORMAL LOW (ref 8.9–10.3)
Creatinine, Ser: 0.84 mg/dL (ref 0.61–1.24)
GFR calc Af Amer: >60 mL/min (ref >60)
GFR calc non Af Amer: >60 mL/min (ref >60)
Glucose, Bld: 107 mg/dL — ABNORMAL HIGH (ref 65–99)
POTASSIUM: 3.9 mmol/L (ref 3.5–5.1)
SODIUM: 130 mmol/L — AB (ref 135–145)
Total Protein: 6.8 g/dL (ref 6.5–8.1)

## 2015-03-24 LAB — APTT: aPTT: 32 seconds (ref 24–36)

## 2015-03-24 LAB — PROTIME-INR
INR: 0.95
Prothrombin Time: 12.9 seconds (ref 11.4–15.0)

## 2015-03-24 NOTE — Discharge Instructions (Signed)

## 2015-03-24 NOTE — ED Notes (Signed)
Pt alert and in NAD at time of d/c to wife.

## 2015-03-24 NOTE — ED Notes (Signed)
Pt given crackers and water. 

## 2015-04-02 ENCOUNTER — Emergency Department: Payer: PPO

## 2015-04-02 ENCOUNTER — Emergency Department
Admission: EM | Admit: 2015-04-02 | Discharge: 2015-04-02 | Disposition: A | Discharge status: Home / Self Care | Payer: PPO | Attending: Emergency Medicine | Admitting: Emergency Medicine

## 2015-04-02 ENCOUNTER — Encounter: Payer: Self-pay | Admitting: Emergency Medicine

## 2015-04-02 DIAGNOSIS — I10 Essential (primary) hypertension: Secondary | ICD-10-CM | POA: Diagnosis not present

## 2015-04-02 DIAGNOSIS — Z79899 Other long term (current) drug therapy: Secondary | ICD-10-CM | POA: Insufficient documentation

## 2015-04-02 DIAGNOSIS — Z7952 Long term (current) use of systemic steroids: Secondary | ICD-10-CM | POA: Diagnosis not present

## 2015-04-02 DIAGNOSIS — B029 Zoster without complications: Secondary | ICD-10-CM | POA: Insufficient documentation

## 2015-04-02 DIAGNOSIS — K5900 Constipation, unspecified: Secondary | ICD-10-CM | POA: Diagnosis present

## 2015-04-02 DIAGNOSIS — R55 Syncope and collapse: Secondary | ICD-10-CM | POA: Insufficient documentation

## 2015-04-02 DIAGNOSIS — K567 Ileus, unspecified: Secondary | ICD-10-CM | POA: Insufficient documentation

## 2015-04-02 LAB — URINALYSIS COMPLETE WITH MICROSCOPIC (ARMC ONLY)
BILIRUBIN URINE: NEGATIVE
Bacteria, UA: NONE SEEN
GLUCOSE, UA: NEGATIVE mg/dL
HGB URINE DIPSTICK: NEGATIVE
Ketones, ur: NEGATIVE mg/dL
Leukocytes, UA: NEGATIVE
Nitrite: NEGATIVE
Protein, ur: NEGATIVE mg/dL
RBC / HPF: NONE SEEN RBC/hpf (ref 0–5)
Specific Gravity, Urine: 1.009 (ref 1.005–1.030)
pH: 9 — ABNORMAL HIGH (ref 5.0–8.0)

## 2015-04-02 LAB — CBC
HCT: 40.4 % (ref 40.0–52.0)
HEMOGLOBIN: 14 g/dL (ref 13.0–18.0)
MCH: 33.7 pg (ref 26.0–34.0)
MCHC: 34.5 g/dL (ref 32.0–36.0)
MCV: 97.5 fL (ref 80.0–100.0)
Platelets: 271 10*3/uL (ref 150–440)
RBC: 4.15 MIL/uL — ABNORMAL LOW (ref 4.40–5.90)
RDW: 13.3 % (ref 11.5–14.5)
WBC: 11.7 10*3/uL — AB (ref 3.8–10.6)

## 2015-04-02 LAB — LIPASE, BLOOD: Lipase: 39 U/L (ref 22–51)

## 2015-04-02 LAB — HEPATIC FUNCTION PANEL
ALT: 28 U/L (ref 17–63)
AST: 28 U/L (ref 15–41)
Albumin: 4 g/dL (ref 3.5–5.0)
Alkaline Phosphatase: 59 U/L (ref 38–126)
BILIRUBIN DIRECT: 0.1 mg/dL (ref 0.1–0.5)
BILIRUBIN INDIRECT: 0.6 mg/dL (ref 0.3–0.9)
BILIRUBIN TOTAL: 0.7 mg/dL (ref 0.3–1.2)
TOTAL PROTEIN: 7 g/dL (ref 6.5–8.1)

## 2015-04-02 LAB — BASIC METABOLIC PANEL
ANION GAP: 9 (ref 5–15)
BUN: 15 mg/dL (ref 6–20)
CHLORIDE: 96 mmol/L — AB (ref 101–111)
CO2: 25 mmol/L (ref 22–32)
Calcium: 8.6 mg/dL — ABNORMAL LOW (ref 8.9–10.3)
Creatinine, Ser: 0.89 mg/dL (ref 0.61–1.24)
GFR calc Af Amer: >60 mL/min (ref >60)
GFR calc non Af Amer: >60 mL/min (ref >60)
Glucose, Bld: 105 mg/dL — ABNORMAL HIGH (ref 65–99)
POTASSIUM: 3.3 mmol/L — AB (ref 3.5–5.1)
Sodium: 130 mmol/L — ABNORMAL LOW (ref 135–145)

## 2015-04-02 LAB — TROPONIN I: Troponin I: <0.03 ng/mL (ref <0.031)

## 2015-04-02 MED ORDER — MORPHINE SULFATE 4 MG/ML IJ SOLN
INTRAMUSCULAR | Status: AC
  Filled 2015-04-02: qty 1

## 2015-04-02 MED ORDER — SODIUM CHLORIDE 0.9 % IV BOLUS (SEPSIS)
1000.0000 mL | Freq: Once | INTRAVENOUS | Status: AC
  Administered 2015-04-02: 1000 mL via INTRAVENOUS

## 2015-04-02 MED ORDER — ONDANSETRON HCL 4 MG/2ML IJ SOLN
INTRAMUSCULAR | Status: AC
  Filled 2015-04-02: qty 2

## 2015-04-02 MED ORDER — MORPHINE SULFATE 4 MG/ML IJ SOLN
4.0000 mg | Freq: Once | INTRAMUSCULAR | Status: AC
  Administered 2015-04-02: 4 mg via INTRAVENOUS

## 2015-04-02 MED ORDER — MINERAL OIL RE ENEM: 1.0000 | ENEMA | Freq: Once | RECTAL | Status: DC

## 2015-04-02 MED ORDER — METOCLOPRAMIDE HCL 10 MG PO TABS: 10.0000 mg | ORAL_TABLET | Freq: Three times a day (TID) | ORAL | Status: DC

## 2015-04-02 MED ORDER — IOHEXOL 300 MG/ML  SOLN
100.0000 mL | Freq: Once | INTRAMUSCULAR | Status: AC | PRN
  Administered 2015-04-02: 100 mL via INTRAVENOUS

## 2015-04-02 MED ORDER — ONDANSETRON HCL 4 MG/2ML IJ SOLN
4.0000 mg | Freq: Once | INTRAMUSCULAR | Status: AC
  Administered 2015-04-02: 4 mg via INTRAVENOUS

## 2015-04-02 MED ORDER — DOCUSATE SODIUM 100 MG PO CAPS: 100.0000 mg | ORAL_CAPSULE | Freq: Two times a day (BID) | ORAL | Status: AC

## 2015-04-02 MED ORDER — IOHEXOL 240 MG/ML SOLN: 25.0000 mL | Freq: Once | INTRAMUSCULAR | Status: DC | PRN

## 2015-04-02 NOTE — ED Notes (Signed)
Patient to ED via EMS from home with c/o near syncopal episode while walking back to bed from bathroom. Patient caught himself on wall and has skin tear to right outer elbow. Patient reports history of shingles x2 weeks and 6 month history of low hemoglobin and platelet count. Patient does report dizziness with sitting up or standing.

## 2015-04-02 NOTE — ED Notes (Signed)
Pt transported to CT ?

## 2015-04-02 NOTE — Discharge Instructions (Signed)
Constipation °Constipation is when a person has fewer than three bowel movements a week, has difficulty having a bowel movement, or has stools that are dry, hard, or larger than normal. As people grow older, constipation is more common. If you try to fix constipation with medicines that make you have a bowel movement (laxatives), the problem may get worse. Long-term laxative use may cause the muscles of the colon to become weak. A low-fiber diet, not taking in enough fluids, and taking certain medicines may make constipation worse.  °CAUSES  °· Certain medicines, such as antidepressants, pain medicine, iron supplements, antacids, and water pills.   °· Certain diseases, such as diabetes, irritable bowel syndrome (IBS), thyroid disease, or depression.   °· Not drinking enough water.   °· Not eating enough fiber-rich foods.   °· Stress or travel.   °· Lack of physical activity or exercise.   °· Ignoring the urge to have a bowel movement.   °· Using laxatives too much.   °SIGNS AND SYMPTOMS  °· Having fewer than three bowel movements a week.   °· Straining to have a bowel movement.   °· Having stools that are hard, dry, or larger than normal.   °· Feeling full or bloated.   °· Pain in the lower abdomen.   °· Not feeling relief after having a bowel movement.   °DIAGNOSIS  °Your health care provider will take a medical history and perform a physical exam. Further testing may be done for severe constipation. Some tests may include: °· A barium enema X-ray to examine your rectum, colon, and, sometimes, your small intestine.   °· A sigmoidoscopy to examine your lower colon.   °· A colonoscopy to examine your entire colon. °TREATMENT  °Treatment will depend on the severity of your constipation and what is causing it. Some dietary treatments include drinking more fluids and eating more fiber-rich foods. Lifestyle treatments may include regular exercise. If these diet and lifestyle recommendations do not help, your health care  provider may recommend taking over-the-counter laxative medicines to help you have bowel movements. Prescription medicines may be prescribed if over-the-counter medicines do not work.  °HOME CARE INSTRUCTIONS  °· Eat foods that have a lot of fiber, such as fruits, vegetables, whole grains, and beans. °· Limit foods high in fat and processed sugars, such as french fries, hamburgers, cookies, candies, and soda.   °· A fiber supplement may be added to your diet if you cannot get enough fiber from foods.   °· Drink enough fluids to keep your urine clear or pale yellow.   °· Exercise regularly or as directed by your health care provider.   °· Go to the restroom when you have the urge to go. Do not hold it.   °· Only take over-the-counter or prescription medicines as directed by your health care provider. Do not take other medicines for constipation without talking to your health care provider first.   °SEEK IMMEDIATE MEDICAL CARE IF:  °· You have bright red blood in your stool.   °· Your constipation lasts for more than 4 days or gets worse.   °· You have abdominal or rectal pain.   °· You have thin, pencil-like stools.   °· You have unexplained weight loss. °MAKE SURE YOU:  °· Understand these instructions. °· Will watch your condition. °· Will get help right away if you are not doing well or get worse. °Document Released: 08/04/2004 Document Revised: 11/11/2013 Document Reviewed: 08/18/2013 °[employer]® Patient Information ©2015 [employer]. This information is not intended to replace advice given to you by your health care provider. Make sure you discuss any questions   you have with your health care provider.  Ileus The intestine (bowel, or gut) is a long, muscular tube connecting your stomach to your rectum. If the intestine stops working, food cannot pass through. This is called an ileus. This can happen for a variety of reasons. Ileus is a major medical problem that usually requires hospitalization. If your  intestine stops working because of a blockage, this is called a bowel obstruction and is a different condition. CAUSES   Surgery in your abdomen. This can last from a few hours to a few days.  An infection or inflammation in the belly (abdomen). This includes inflammation of the lining of the abdomen (peritonitis).  Infection or inflammation in other parts of the body, such as pneumonia or pancreatitis.  Passage of gallstones or kidney stones.  Damage to the nerves or blood vessels which go to the bowel.  Imbalance in the salts in the blood (electrolytes).  Injury to the brain and/or spinal cord.  Medications. Many medications can cause ileus or make it worse. The most common of these are strong pain medications. SYMPTOMS  Symptoms of bowel obstruction come from the bowel inactivity. They may include:  Bloating. Your belly gets bigger (distension).  Pain or discomfort in the abdomen.  Poor appetite, feeling sick to your stomach (nausea), and vomiting.  You may also not be able to hear your normal bowel sounds, such as "growling" in your stomach. DIAGNOSIS   Your history and a physical exam will usually suggest to your caregiver that you have an ileus.  X-rays or a CT scan of your abdomen will confirm the diagnosis. X-rays, CT scans, and lab tests may also suggest the cause. TREATMENT   Rest the intestine until it starts working again. This is most often accomplished by:  Stopping intake of oral food and drink. Dehydration is prevented by using IV (intravenous) fluids.  Sometimes, a nasogastric tube (NG tube) is needed. This is a narrow plastic tube inserted through your nose and into your stomach. It is connected to suction to keep the stomach emptied out. This also helps treat the nausea and vomiting.  If there is an imbalance in the electrolytes, they are corrected with supplements in your intravenous fluids.  Medications that might make an ileus worse might be  stopped.  There are no medications that reliably treat ileus, though your caregiver may suggest a trial of certain medications.  If your condition is slow to resolve, you will be reevaluated to be sure another condition, such as a blockage, is not present. Ileus is common and usually has a good outcome. Depending on the cause of your ileus, it usually can be treated by your caregivers with good results. Sometimes, specialists (surgeons or gastroenterologists) are asked to assist in your care.  HOME CARE INSTRUCTIONS   Follow your caregiver's instructions regarding diet and fluid intake. This will usually include drinking plenty of clear fluids, avoiding alcohol and caffeine, and eating a gentle diet.  Follow your caregiver's instructions regarding activity. A period of rest is sometimes advised before returning to work or school.  Take only medications prescribed by your caregiver. Be especially careful with narcotic pain medication, which can slow your bowel activity and contribute to ileus.  Keep any follow-up appointments with your caregiver or specialists. SEEK MEDICAL CARE IF:   You have a recurrence of nausea, vomiting, or abdominal discomfort.  You develop fever of more than 102 F (38.9 C). SEEK IMMEDIATE MEDICAL CARE IF:   You have  severe abdominal pain.  You are unable to keep fluids down. Document Released: 11/09/2003 Document Revised: 03/23/2014 Document Reviewed: 03/11/2009 [facility] Patient Information 2015 [place], [place]. This information is not intended to replace advice given to you by your health care provider. Make sure you discuss any questions you have with your health care provider.  Near-Syncope Near-syncope (commonly known as near fainting) is sudden weakness, dizziness, or feeling like you might pass out. During an episode of near-syncope, you may also develop pale skin, have tunnel vision, or feel sick to your stomach (nauseous). Near-syncope may occur when  getting up after sitting or while standing for a long time. It is caused by a sudden decrease in blood flow to the brain. This decrease can result from various causes or triggers, most of which are not serious. However, because near-syncope can sometimes be a sign of something serious, a medical evaluation is required. The specific cause is often not determined. HOME CARE INSTRUCTIONS  Monitor your condition for any changes. The following actions may help to alleviate any discomfort you are experiencing:  Have someone stay with you until you feel stable.  Lie down right away and prop your feet up if you start feeling like you might faint. Breathe deeply and steadily. Wait until all the symptoms have passed. Most of these episodes last only a few minutes. You may feel tired for several hours.   Drink enough fluids to keep your urine clear or pale yellow.   If you are taking blood pressure or heart medicine, get up slowly when seated or lying down. Take several minutes to sit and then stand. This can reduce dizziness.  Follow up with your health care provider as directed. SEEK IMMEDIATE MEDICAL CARE IF:   You have a severe headache.   You have unusual pain in the chest, abdomen, or back.   You are bleeding from the mouth or rectum, or you have black or tarry stool.   You have an irregular or very fast heartbeat.   You have repeated fainting or have seizure-like jerking during an episode.   You faint when sitting or lying down.   You have confusion.   You have difficulty walking.   You have severe weakness.   You have vision problems.  MAKE SURE YOU:   Understand these instructions.  Will watch your condition.  Will get help right away if you are not doing well or get worse. Document Released: 11/06/2005 Document Revised: 11/11/2013 Document Reviewed: 04/11/2013 [facility] Patient Information 2015 [place], [place]. This information is not intended to replace advice  given to you by your health care provider. Make sure you discuss any questions you have with your health care provider.

## 2015-04-02 NOTE — ED Provider Notes (Signed)
[facility] Emergency Department Provider Note  Time seen: 10:39 AM  I have reviewed the triage vital signs and the nursing notes.   HISTORY  Chief Complaint Near Syncope; Dizziness; and Constipation    HPI Keith Balz. is a 81 y.o. male with a past medical history of ITP, active shingles, hypertension, prostate cancer, hyperlipidemia who presents to the emergency department with abdominal pain and a near syncopal episode. According to the patient for the past 3 days he has had worsening abdominal pain mostly left-sided, and has been unable to have a bowel movement. Patient has tried laxatives without relief. Patient tried to have a bowel movement today, upon standing had a near syncopal episode and fell into the wall but was able to catch himself. He suffered a small skin tear to his right elbow. Denies hitting his head or loss of consciousness. Patient's main concern is his constipation and abdominal pain. Denies nausea or vomiting. Denies fever or dysuria.Describes abdominal pain as dull, constant mostly in the left lower quadrant. No radiation.    Past Medical History  Diagnosis Date  . ITP (idiopathic thrombocytopenic purpura)   . Shingles   . Dehydration   . Hypertension   . Prostate cancer   . Hypercholesteremia     There are no active problems to display for this patient.   Past Surgical History  Procedure Laterality Date  . Hernia repair    . Eye surgery    . Artificial left eye    . Joint replacement      Current Outpatient Rx  Name  Route  Sig  Dispense  Refill  . dutasteride (AVODART) 0.5 MG capsule   Oral   Take 0.5 mg by mouth daily.         Marland Kitchen gabapentin (NEURONTIN) 100 MG capsule   Oral   Take 100-300 mg by mouth See admin instructions. Take 1 tablet by mouth every morning, take 1 tablet at lunch, take 3 tablets at bedtime.         Marland Kitchen lisinopril (PRINIVIL,ZESTRIL) 20 MG tablet   Oral   Take 20 mg by mouth daily.          . magnesium citrate SOLN   Oral   Take 1 Bottle by mouth once.         Marland Kitchen omeprazole (PRILOSEC) 20 MG capsule   Oral   Take 20 mg by mouth daily.         . pravastatin (PRAVACHOL) 20 MG tablet   Oral   Take 20 mg by mouth daily.         . predniSONE (DELTASONE) 20 MG tablet   Oral   Take 10 mg by mouth daily with breakfast.         . tamsulosin (FLOMAX) 0.4 MG CAPS capsule   Oral   Take 0.4 mg by mouth daily.         . valACYclovir (VALTREX) 500 MG tablet   Oral   Take 500 mg by mouth 2 (two) times daily.         Marland Kitchen zolpidem (AMBIEN) 5 MG tablet   Oral   Take 5 mg by mouth at bedtime as needed for sleep.           Allergies Review of patient's allergies indicates no known allergies.  History reviewed. No pertinent family history.  Social History History  Substance Use Topics  . Smoking status: Never Smoker   . Smokeless tobacco: Not  on file  . Alcohol Use: No    Review of Systems Constitutional: Negative for fever. Cardiovascular: Negative for chest pain. Respiratory: Negative for shortness of breath. Gastrointestinal: Positive for abdominal pain and distention. Negative for nausea/vomiting/diarrhea. Positive for constipation. Genitourinary: Negative for dysuria. Musculoskeletal: Negative for back pain. Skin: Positive for left flank rash consistent with shingles. Neurological: Negative for headaches, or focal weakness 10-point ROS otherwise negative.  ____________________________________________   PHYSICAL EXAM:  VITAL SIGNS: ED Triage Vitals  Enc Vitals Group     BP 04/02/15 0953 145/85 mmHg     Pulse Rate 04/02/15 0953 84     Resp 04/02/15 0953 21     Temp 04/02/15 0953 97.5 F (36.4 C)     Temp Source 04/02/15 0953 Oral     SpO2 04/02/15 0953 99 %     Weight 04/02/15 0953 162 lb (73.483 kg)     Height 04/02/15 0953 5\' 8"  (1.727 m)     Head Cir --      Peak Flow --      Pain Score --      Pain Loc --      Pain Edu? --       Excl. in [place]? --     Constitutional: Alert and oriented. Well appearing and in no distress. Eyes: Normal exam ENT   Mouth/Throat: Mucous membranes are moist. Cardiovascular: Normal rate, regular rhythm. No murmurs, rubs, or gallops. Respiratory: Normal respiratory effort without tachypnea nor retractions. Breath sounds are clear and equal bilaterally. No wheezes/rales/rhonchi. Gastrointestinal: Soft, moderate diffuse tenderness worse in the left lower quadrant. No rebound or guarding. Mild distention with tympanic percussion. Musculoskeletal: Nontender with normal range of motion  Neurologic:  Normal speech and language. No gross focal neurologic deficits  Skin:  Skin is warm, dry and intact.  Psychiatric: Mood and affect are normal.   ____________________________________________    EKG  EKG shows normal sinus rhythm at 80 bpm. Narrow QRS, normal intervals, normal axis. No concerning ST changes noted.  ____________________________________________    [id number]  CT most consistent with ileus with stool in the colon.   INITIAL IMPRESSION / ASSESSMENT AND PLAN / ED COURSE  Pertinent labs & imaging results that were available during my care of the patient were reviewed by me and considered in my medical decision making (see chart for details).  81 year old male with a past medical history of ITP who presents for a near syncopal episode and 3 days of abdominal pain/constipation. We'll check labs, CT abdomen and pelvis to rule out small bowel obstruction. EKG appears well. Denies any chest pain.  ----------------------------------------- 2:19 PM on 04/02/2015 -----------------------------------------  CT most consistent with ileus with stool in the colon. Rectal exam shows moderate amount of hard stool in the colon which has been disimpacted manually. I will order an enema, and monitor patient in the ED. States his abdominal pain is somewhat improved since arrival. Labs are  largely within normal limits besides a mild white blood cell count elevation.  ----------------------------------------- 3:44 PM on 04/02/2015 -----------------------------------------  Patient has had 3 large bowel movements. Patient states he feels much better. We'll discharge home on Colace and Reglan. Discussed this with his family was agreeable to this plan. ____________________________________________   FINAL CLINICAL IMPRESSION(S) / ED DIAGNOSES  Near syncope Abdominal pain Constipation Ileus   Harvest Dark, MD 04/02/15 1544

## 2015-04-04 ENCOUNTER — Other Ambulatory Visit: Payer: Self-pay | Admitting: *Deleted

## 2015-04-04 DIAGNOSIS — D693 Immune thrombocytopenic purpura: Secondary | ICD-10-CM

## 2015-04-05 ENCOUNTER — Encounter: Payer: Self-pay | Admitting: Emergency Medicine

## 2015-04-05 ENCOUNTER — Emergency Department
Admission: EM | Admit: 2015-04-05 | Discharge: 2015-04-05 | Disposition: A | Discharge status: Home / Self Care | Payer: PPO | Source: Home / Self Care | Attending: Emergency Medicine | Admitting: Emergency Medicine

## 2015-04-05 DIAGNOSIS — Z792 Long term (current) use of antibiotics: Secondary | ICD-10-CM

## 2015-04-05 DIAGNOSIS — N138 Other obstructive and reflux uropathy: Secondary | ICD-10-CM | POA: Diagnosis present

## 2015-04-05 DIAGNOSIS — B0229 Other postherpetic nervous system involvement: Secondary | ICD-10-CM | POA: Diagnosis present

## 2015-04-05 DIAGNOSIS — D693 Immune thrombocytopenic purpura: Secondary | ICD-10-CM | POA: Diagnosis present

## 2015-04-05 DIAGNOSIS — Z79899 Other long term (current) drug therapy: Secondary | ICD-10-CM

## 2015-04-05 DIAGNOSIS — Z7952 Long term (current) use of systemic steroids: Secondary | ICD-10-CM

## 2015-04-05 DIAGNOSIS — K219 Gastro-esophageal reflux disease without esophagitis: Secondary | ICD-10-CM | POA: Diagnosis present

## 2015-04-05 DIAGNOSIS — N390 Urinary tract infection, site not specified: Secondary | ICD-10-CM | POA: Diagnosis present

## 2015-04-05 DIAGNOSIS — I10 Essential (primary) hypertension: Secondary | ICD-10-CM | POA: Insufficient documentation

## 2015-04-05 DIAGNOSIS — Z96649 Presence of unspecified artificial hip joint: Secondary | ICD-10-CM | POA: Diagnosis present

## 2015-04-05 DIAGNOSIS — E860 Dehydration: Secondary | ICD-10-CM | POA: Diagnosis present

## 2015-04-05 DIAGNOSIS — E785 Hyperlipidemia, unspecified: Secondary | ICD-10-CM | POA: Diagnosis present

## 2015-04-05 DIAGNOSIS — R339 Retention of urine, unspecified: Secondary | ICD-10-CM | POA: Diagnosis not present

## 2015-04-05 DIAGNOSIS — B029 Zoster without complications: Secondary | ICD-10-CM | POA: Diagnosis present

## 2015-04-05 DIAGNOSIS — N401 Enlarged prostate with lower urinary tract symptoms: Secondary | ICD-10-CM | POA: Diagnosis present

## 2015-04-05 DIAGNOSIS — N179 Acute kidney failure, unspecified: Secondary | ICD-10-CM | POA: Diagnosis not present

## 2015-04-05 DIAGNOSIS — Z8546 Personal history of malignant neoplasm of prostate: Secondary | ICD-10-CM

## 2015-04-05 DIAGNOSIS — N400 Enlarged prostate without lower urinary tract symptoms: Secondary | ICD-10-CM | POA: Diagnosis present

## 2015-04-05 DIAGNOSIS — E780 Pure hypercholesterolemia: Secondary | ICD-10-CM | POA: Diagnosis present

## 2015-04-05 DIAGNOSIS — R319 Hematuria, unspecified: Secondary | ICD-10-CM | POA: Diagnosis present

## 2015-04-05 LAB — URINALYSIS COMPLETE WITH MICROSCOPIC (ARMC ONLY)
BACTERIA UA: NONE SEEN
Bilirubin Urine: NEGATIVE
GLUCOSE, UA: NEGATIVE mg/dL
Ketones, ur: NEGATIVE mg/dL
NITRITE: NEGATIVE
Protein, ur: NEGATIVE mg/dL
Specific Gravity, Urine: 1.008 (ref 1.005–1.030)
pH: 6 (ref 5.0–8.0)

## 2015-04-05 MED ORDER — PHENAZOPYRIDINE HCL 200 MG PO TABS
200.0000 mg | ORAL_TABLET | Freq: Three times a day (TID) | ORAL | Status: DC | PRN
Start: 2015-04-05 — End: 2015-04-08

## 2015-04-05 MED ORDER — SULFAMETHOXAZOLE-TRIMETHOPRIM 800-160 MG PO TABS: 1.0000 | ORAL_TABLET | Freq: Two times a day (BID) | ORAL | Status: DC

## 2015-04-05 NOTE — ED Provider Notes (Signed)
[facility] Emergency Department Provider Note  ____________________________________________  Time seen: Approximately 2:10 PM  I have reviewed the triage vital signs and the nursing notes.   HISTORY  Chief Complaint Urinary Retention    HPI [name]. is a 81 y.o. male who presents to the emergency room for evaluation of urinary frequency, hesitancy, dysuria and pain associated with urination. Seen here 4 days ago for abdominal pain and ileus. Diagnosis constipation. Much improved from that visit. Chief complaint today is all urinary related.  Past Medical History  Diagnosis Date  . ITP (idiopathic thrombocytopenic purpura)   . Shingles   . Dehydration   . Hypertension   . Prostate cancer   . Hypercholesteremia     There are no active problems to display for this patient.   Past Surgical History  Procedure Laterality Date  . Hernia repair    . Eye surgery    . Artificial left eye    . Joint replacement      Current Outpatient Rx  Name  Route  Sig  Dispense  Refill  . docusate sodium (COLACE) 100 MG capsule   Oral   Take 1 capsule (100 mg total) by mouth 2 (two) times daily.   60 capsule   2   . dutasteride (AVODART) 0.5 MG capsule   Oral   Take 0.5 mg by mouth daily.         Marland Kitchen gabapentin (NEURONTIN) 100 MG capsule   Oral   Take 100-300 mg by mouth See admin instructions. Take 1 tablet by mouth every morning, take 1 tablet at lunch, take 3 tablets at bedtime.         Marland Kitchen lisinopril (PRINIVIL,ZESTRIL) 20 MG tablet   Oral   Take 20 mg by mouth daily.         . magnesium citrate SOLN   Oral   Take 1 Bottle by mouth once.         . metoCLOPramide (REGLAN) 10 MG tablet   Oral   Take 1 tablet (10 mg total) by mouth 3 (three) times daily with meals.   30 tablet   1   . omeprazole (PRILOSEC) 20 MG capsule   Oral   Take 20 mg by mouth daily.         . phenazopyridine (PYRIDIUM) 200 MG tablet   Oral   Take 1  tablet (200 mg total) by mouth 3 (three) times daily as needed for pain.   6 tablet   0   . pravastatin (PRAVACHOL) 20 MG tablet   Oral   Take 20 mg by mouth daily.         . predniSONE (DELTASONE) 20 MG tablet   Oral   Take 10 mg by mouth daily with breakfast.         . sulfamethoxazole-trimethoprim (BACTRIM DS,SEPTRA DS) 800-160 MG per tablet   Oral   Take 1 tablet by mouth 2 (two) times daily.   20 tablet   0   . tamsulosin (FLOMAX) 0.4 MG CAPS capsule   Oral   Take 0.4 mg by mouth daily.         . valACYclovir (VALTREX) 500 MG tablet   Oral   Take 500 mg by mouth 2 (two) times daily.         Marland Kitchen zolpidem (AMBIEN) 5 MG tablet   Oral   Take 5 mg by mouth at bedtime as needed for sleep.  Allergies Review of patient's allergies indicates no known allergies.  No family history on file.  Social History History  Substance Use Topics  . Smoking status: Never Smoker   . Smokeless tobacco: Not on file  . Alcohol Use: No    Review of Systems Constitutional: No fever/chills Cardiovascular: Denies chest pain. Respiratory: Denies shortness of breath. Gastrointestinal: Minimal suprapubic abdominal pain.  No nausea, no vomiting.  No diarrhea.  No constipation. Genitourinary: Negative for dysuria. Musculoskeletal: Negative for back pain.   10-point ROS otherwise negative.  ____________________________________________   PHYSICAL EXAM:  VITAL SIGNS: ED Triage Vitals  Enc Vitals Group     BP 04/05/15 1322 119/75 mmHg     Pulse Rate 04/05/15 1322 92     Resp 04/05/15 1322 18     Temp 04/05/15 1322 97.6 F (36.4 C)     Temp Source 04/05/15 1322 Oral     SpO2 04/05/15 1322 99 %     Weight 04/05/15 1322 162 lb (73.483 kg)     Height 04/05/15 1322 5\' 8"  (1.727 m)     Head Cir --      Peak Flow --      Pain Score 04/05/15 1323 0     Pain Loc --      Pain Edu? --      Excl. in [place]? --     Constitutional: Alert and oriented. Well appearing and  in no acute distress. Gastrointestinal: Soft and nontender. No distention. No abdominal bruits. No CVA tenderness. Genitourinary: Positive suprapubic tenderness. Musculoskeletal: No lower extremity tenderness nor edema.  No joint effusions. Neurologic:  Normal speech and language. No gross focal neurologic deficits are appreciated. Speech is normal. No gait instability. Skin:  Skin is warm, dry and intact. Positive left sided rash noted. Psychiatric: Mood and affect are normal. Speech and behavior are normal.  ____________________________________________   LABS (all labs ordered are listed, but only abnormal results are displayed)  Labs Reviewed  URINALYSIS COMPLETEWITH MICROSCOPIC ([place])  - Abnormal; Notable for the following:    Color, Urine YELLOW (*)    APPearance CLEAR (*)    Hgb urine dipstick 2+ (*)    Leukocytes, UA 2+ (*)    Squamous Epithelial / LPF 0-5 (*)    All other components within normal limits   ____________________________________________  EKG  Not applicable ____________________________________________  RADIOLOGY  None ____________________________________________   PROCEDURES  Procedure(s) performed: None  Critical Care performed: No  ____________________________________________   INITIAL IMPRESSION / ASSESSMENT AND PLAN / ED COURSE  Pertinent labs & imaging results that were available during my care of the patient were reviewed by me and considered in my medical decision making (see chart for details).  Reviewed all labs and discussed findings with patient. We'll treat with antibiotics as directed.  Patient and wife both understand to return to the ER if symptoms worsen or follow up with PCP. ____________________________________________   FINAL CLINICAL IMPRESSION(S) / ED DIAGNOSES  Final diagnoses:  UTI (lower urinary tract infection)      Arlyss Repress, PA-C 04/05/15 [place], MD 04/06/15 [phone]

## 2015-04-05 NOTE — ED Notes (Signed)
Pt to ed with c/o urinary retention.  Pt reports he was seen here for intestinal blockage on Friday here in ER.  States when he went home has had very little urine output.  Reports he can't void and when he does only a small flow of urine is produced.

## 2015-04-05 NOTE — Discharge Instructions (Signed)
Urinary Tract Infection A urinary tract infection (UTI) can occur any place along the urinary tract. The tract includes the kidneys, ureters, bladder, and urethra. A type of germ called bacteria often causes a UTI. UTIs are often helped with antibiotic medicine.  HOME CARE   If given, take antibiotics as told by your doctor. Finish them even if you start to feel better.  Drink enough fluids to keep your pee (urine) clear or pale yellow.  Avoid tea, drinks with caffeine, and bubbly (carbonated) drinks.  Pee often. Avoid holding your pee in for a long time.  Pee before and after having sex (intercourse).  Wipe from front to back after you poop (bowel movement) if you are a woman. Use each tissue only once. GET HELP RIGHT AWAY IF:   You have back pain.  You have lower belly (abdominal) pain.  You have chills.  You feel sick to your stomach (nauseous).  You throw up (vomit).  Your burning or discomfort with peeing does not go away.  You have a fever.  Your symptoms are not better in 3 days. MAKE SURE YOU:   Understand these instructions.  Will watch your condition.  Will get help right away if you are not doing well or get worse. Document Released: 04/24/2008 Document Revised: 07/31/2012 Document Reviewed: 06/06/2012 [facility] Patient Information 2015 [place], [place]. This information is not intended to replace advice given to you by your health care provider. Make sure you discuss any questions you have with your health care provider.   Return if needed, otherwise follow-up with your PCP.

## 2015-04-06 ENCOUNTER — Encounter: Payer: Self-pay | Admitting: Emergency Medicine

## 2015-04-06 ENCOUNTER — Inpatient Hospital Stay
Admission: EM | Admit: 2015-04-06 | Discharge: 2015-04-08 | DRG: 683 | Disposition: A | Discharge status: Home / Self Care | Payer: PPO | Attending: Internal Medicine | Admitting: Internal Medicine

## 2015-04-06 DIAGNOSIS — E785 Hyperlipidemia, unspecified: Secondary | ICD-10-CM | POA: Diagnosis present

## 2015-04-06 DIAGNOSIS — N390 Urinary tract infection, site not specified: Secondary | ICD-10-CM | POA: Diagnosis present

## 2015-04-06 DIAGNOSIS — N3001 Acute cystitis with hematuria: Secondary | ICD-10-CM

## 2015-04-06 DIAGNOSIS — E860 Dehydration: Secondary | ICD-10-CM | POA: Diagnosis present

## 2015-04-06 DIAGNOSIS — B029 Zoster without complications: Secondary | ICD-10-CM | POA: Diagnosis present

## 2015-04-06 DIAGNOSIS — N289 Disorder of kidney and ureter, unspecified: Secondary | ICD-10-CM

## 2015-04-06 DIAGNOSIS — N138 Other obstructive and reflux uropathy: Secondary | ICD-10-CM | POA: Diagnosis present

## 2015-04-06 DIAGNOSIS — K219 Gastro-esophageal reflux disease without esophagitis: Secondary | ICD-10-CM | POA: Diagnosis present

## 2015-04-06 DIAGNOSIS — Z8546 Personal history of malignant neoplasm of prostate: Secondary | ICD-10-CM | POA: Diagnosis not present

## 2015-04-06 DIAGNOSIS — D693 Immune thrombocytopenic purpura: Secondary | ICD-10-CM | POA: Diagnosis present

## 2015-04-06 DIAGNOSIS — R339 Retention of urine, unspecified: Secondary | ICD-10-CM

## 2015-04-06 DIAGNOSIS — E780 Pure hypercholesterolemia: Secondary | ICD-10-CM | POA: Diagnosis present

## 2015-04-06 DIAGNOSIS — Z96649 Presence of unspecified artificial hip joint: Secondary | ICD-10-CM | POA: Diagnosis present

## 2015-04-06 DIAGNOSIS — Z79899 Other long term (current) drug therapy: Secondary | ICD-10-CM | POA: Diagnosis not present

## 2015-04-06 DIAGNOSIS — N401 Enlarged prostate with lower urinary tract symptoms: Secondary | ICD-10-CM | POA: Diagnosis present

## 2015-04-06 DIAGNOSIS — I10 Essential (primary) hypertension: Secondary | ICD-10-CM | POA: Diagnosis present

## 2015-04-06 DIAGNOSIS — N179 Acute kidney failure, unspecified: Secondary | ICD-10-CM | POA: Diagnosis present

## 2015-04-06 DIAGNOSIS — B0229 Other postherpetic nervous system involvement: Secondary | ICD-10-CM | POA: Diagnosis present

## 2015-04-06 DIAGNOSIS — N400 Enlarged prostate without lower urinary tract symptoms: Secondary | ICD-10-CM | POA: Diagnosis present

## 2015-04-06 DIAGNOSIS — R319 Hematuria, unspecified: Secondary | ICD-10-CM | POA: Diagnosis present

## 2015-04-06 DIAGNOSIS — Z7952 Long term (current) use of systemic steroids: Secondary | ICD-10-CM | POA: Diagnosis not present

## 2015-04-06 LAB — CBC
HEMATOCRIT: 38.2 % — AB (ref 40.0–52.0)
HEMOGLOBIN: 13 g/dL (ref 13.0–18.0)
MCH: 33.6 pg (ref 26.0–34.0)
MCHC: 34 g/dL (ref 32.0–36.0)
MCV: 98.7 fL (ref 80.0–100.0)
Platelets: 256 10*3/uL (ref 150–440)
RBC: 3.87 MIL/uL — AB (ref 4.40–5.90)
RDW: 13.7 % (ref 11.5–14.5)
WBC: 14.4 10*3/uL — ABNORMAL HIGH (ref 3.8–10.6)

## 2015-04-06 LAB — BASIC METABOLIC PANEL
ANION GAP: 9 (ref 5–15)
BUN: 32 mg/dL — ABNORMAL HIGH (ref 6–20)
CHLORIDE: 94 mmol/L — AB (ref 101–111)
CO2: 26 mmol/L (ref 22–32)
Calcium: 8.8 mg/dL — ABNORMAL LOW (ref 8.9–10.3)
Creatinine, Ser: 2.25 mg/dL — ABNORMAL HIGH (ref 0.61–1.24)
GFR calc Af Amer: 30 mL/min — ABNORMAL LOW (ref >60)
GFR, EST NON AFRICAN AMERICAN: 26 mL/min — AB (ref >60)
Glucose, Bld: 105 mg/dL — ABNORMAL HIGH (ref 65–99)
POTASSIUM: 3.6 mmol/L (ref 3.5–5.1)
Sodium: 129 mmol/L — ABNORMAL LOW (ref 135–145)

## 2015-04-06 LAB — URINALYSIS COMPLETE WITH MICROSCOPIC (ARMC ONLY)
BILIRUBIN URINE: NEGATIVE
Glucose, UA: NEGATIVE mg/dL
Ketones, ur: NEGATIVE mg/dL
LEUKOCYTES UA: NEGATIVE
Nitrite: POSITIVE — AB
PH: 5 (ref 5.0–8.0)
Protein, ur: NEGATIVE mg/dL
Specific Gravity, Urine: 1.006 (ref 1.005–1.030)
Squamous Epithelial / LPF: NONE SEEN

## 2015-04-06 MED ORDER — CIPROFLOXACIN IN D5W 400 MG/200ML IV SOLN: 400.0000 mg | Freq: Once | INTRAVENOUS | Status: AC

## 2015-04-06 MED ORDER — ZOLPIDEM TARTRATE 5 MG PO TABS: 5.0000 mg | ORAL_TABLET | Freq: Every evening | ORAL | Status: DC | PRN

## 2015-04-06 MED ORDER — DOCUSATE SODIUM 100 MG PO CAPS
100.0000 mg | ORAL_CAPSULE | Freq: Two times a day (BID) | ORAL | Status: DC
  Administered 2015-04-06 – 2015-04-08 (×5): 100 mg via ORAL
  Filled 2015-04-06 (×5): qty 1

## 2015-04-06 MED ORDER — PANTOPRAZOLE SODIUM 40 MG PO TBEC
40.0000 mg | DELAYED_RELEASE_TABLET | Freq: Every day | ORAL | Status: DC
  Administered 2015-04-06 – 2015-04-08 (×3): 40 mg via ORAL
  Filled 2015-04-06 (×3): qty 1

## 2015-04-06 MED ORDER — PRAVASTATIN SODIUM 20 MG PO TABS
20.0000 mg | ORAL_TABLET | Freq: Every day | ORAL | Status: DC
  Administered 2015-04-06 – 2015-04-08 (×3): 20 mg via ORAL
  Filled 2015-04-06 (×4): qty 1

## 2015-04-06 MED ORDER — GABAPENTIN 100 MG PO CAPS
100.0000 mg | ORAL_CAPSULE | Freq: Every day | ORAL | Status: DC
  Filled 2015-04-06: qty 1

## 2015-04-06 MED ORDER — GABAPENTIN 100 MG PO CAPS: 100.0000 mg | ORAL_CAPSULE | ORAL | Status: DC

## 2015-04-06 MED ORDER — CIPROFLOXACIN HCL 250 MG PO TABS
250.0000 mg | ORAL_TABLET | Freq: Two times a day (BID) | ORAL | Status: DC
  Administered 2015-04-07 – 2015-04-08 (×3): 250 mg via ORAL
  Filled 2015-04-06 (×3): qty 1

## 2015-04-06 MED ORDER — ACETAMINOPHEN 650 MG RE SUPP: 650.0000 mg | Freq: Four times a day (QID) | RECTAL | Status: DC | PRN

## 2015-04-06 MED ORDER — TAMSULOSIN HCL 0.4 MG PO CAPS
0.4000 mg | ORAL_CAPSULE | Freq: Every day | ORAL | Status: DC
  Administered 2015-04-06 – 2015-04-07 (×2): 0.4 mg via ORAL
  Filled 2015-04-06 (×2): qty 1

## 2015-04-06 MED ORDER — DUTASTERIDE 0.5 MG PO CAPS
0.5000 mg | ORAL_CAPSULE | Freq: Every day | ORAL | Status: DC
  Administered 2015-04-06 – 2015-04-08 (×3): 0.5 mg via ORAL
  Filled 2015-04-06 (×3): qty 1

## 2015-04-06 MED ORDER — GABAPENTIN 100 MG PO CAPS
100.0000 mg | ORAL_CAPSULE | Freq: Every day | ORAL | Status: DC
  Administered 2015-04-08: 08:00:00 100 mg via ORAL
  Filled 2015-04-06: qty 1

## 2015-04-06 MED ORDER — CIPROFLOXACIN IN D5W 400 MG/200ML IV SOLN
INTRAVENOUS | Status: AC
  Administered 2015-04-06: 400 mg
  Filled 2015-04-06: qty 200

## 2015-04-06 MED ORDER — GABAPENTIN 300 MG PO CAPS
300.0000 mg | ORAL_CAPSULE | Freq: Every day | ORAL | Status: DC
  Administered 2015-04-06 – 2015-04-07 (×2): 300 mg via ORAL
  Filled 2015-04-06 (×2): qty 1

## 2015-04-06 MED ORDER — ACETAMINOPHEN 325 MG PO TABS: 650.0000 mg | ORAL_TABLET | Freq: Four times a day (QID) | ORAL | Status: DC | PRN

## 2015-04-06 MED ORDER — VALACYCLOVIR HCL 500 MG PO TABS
500.0000 mg | ORAL_TABLET | Freq: Two times a day (BID) | ORAL | Status: DC
  Administered 2015-04-06 – 2015-04-08 (×5): 500 mg via ORAL
  Filled 2015-04-06 (×5): qty 1

## 2015-04-06 MED ORDER — POLYETHYLENE GLYCOL 3350 17 G PO PACK
17.0000 g | PACK | Freq: Every day | ORAL | Status: DC | PRN
  Filled 2015-04-06: qty 1

## 2015-04-06 MED ORDER — SODIUM CHLORIDE 0.9 % IV SOLN
INTRAVENOUS | Status: DC
  Administered 2015-04-06 – 2015-04-08 (×5): via INTRAVENOUS

## 2015-04-06 MED ORDER — PREDNISONE 10 MG PO TABS
10.0000 mg | ORAL_TABLET | Freq: Every day | ORAL | Status: DC
  Administered 2015-04-07 – 2015-04-08 (×2): 10 mg via ORAL
  Filled 2015-04-06 (×2): qty 1

## 2015-04-06 NOTE — Plan of Care (Signed)
Problem: Discharge Progression Outcomes Goal: Discharge plan in place and appropriate Individualization of Care Pt prefers to be called [name] of ITP sees Dr. Berton Lan, Prostate Cancer sees Dr. Ma Hillock, shingles--dry at this time, hypertension on medications at home, high cholesterol on home medications High fall precautions. Goal: Other Discharge Outcomes/Goals Plan of Care Progress to Goal:  Pt admitted with acute renal failure, foley inserted in ED for inability to void and urinary retention.  Noted orange in color from pyridium use prior to admission and a few small clots, pt creat. 2.25 on admission,  Pt instructed to limit po water intake, IVF per orders.

## 2015-04-06 NOTE — Evaluation (Signed)
Physical Therapy Evaluation Patient Details Name: [name]. MRN: [id number] DOB: [birth year] Today's Date: 04/06/2015   History of Present Illness  Pt is an 81 y.o. male admitted with ARF from urinary retention and foley catheter placed.  Of note, pt went to ED 03/23/15 with shingles and vomiting; ED 04/02/15 with near syncopal episode standing after attempting BM, abdominal pain, and constipation; ED 04/05/15 with urinary retention; and back to ED early AM 04/06/15 and admitted.  Clinical Impression  Currently pt demonstrates impairments with general weakness, balance, and limitations with functional mobility.  Prior to admission, pt was independent ambulating without AD.  Pt lives with his wife in 1 level home with 2-3 steps to enter with B railing.  Currently pt is CGA with ambulation (pt needing to hold onto IV pole for balance).  Pt would benefit from skilled PT to address above noted impairments and functional limitations but anticipate pt will progress well with continued mobility during hospital stay.  Recommend pt discharge to home with support of his wife/family when medically appropriate.     Follow Up Recommendations No PT follow up (assist/support from family as needed)    Equipment Recommendations       Recommendations for Other Services       Precautions / Restrictions Precautions Precautions: Fall Restrictions Weight Bearing Restrictions: No      Mobility  Bed Mobility Overal bed mobility: Modified Independent             General bed mobility comments: increased time supine to sit with use of bed rail  Transfers Overall transfer level: Needs assistance Equipment used: None Transfers: Sit to/from Stand Sit to Stand: Min guard            Ambulation/Gait Ambulation/Gait assistance: Min guard Ambulation Distance (Feet):  (200 feet x2 (pt requesting 2nd lap for exercise)) Assistive device:  (IV pole (attempted without AD but pt preferring to hold  onto IV pole))       General Gait Details: steady with UE support on IV pole; mild decreased cadence  Stairs            Wheelchair Mobility    Modified Rankin (Stroke Patients Only)       Balance Overall balance assessment:  (Mild unsteadiness initially but balance improved with mobility.)                                           Pertinent Vitals/Pain Pain Assessment: No/denies pain  HR 83-89 bpm during session. O2 on room air >93% during session.    Home Living Family/patient expects to be discharged to:: Private residence Living Arrangements: Spouse/significant other Available Help at Discharge: Family Type of Home: House Home Access: Stairs to enter Entrance Stairs-Rails: Right Entrance Stairs-Number of Steps: 2-3 Home Layout: One level (office on 2nd level) Home Equipment: None      Prior Function Level of Independence: Independent               Hand Dominance        Extremity/Trunk Assessment   Upper Extremity Assessment: Overall WFL for tasks assessed           Lower Extremity Assessment: Overall WFL for tasks assessed         Communication   Communication: No difficulties  Cognition Arousal/Alertness: Awake/alert Behavior During Therapy: WFL for tasks assessed/performed Overall Cognitive Status: Within  Functional Limits for tasks assessed                      General Comments  Nursing cleared pt for participation in physical therapy.  Pt reports that he had been active and golfing 2 weeks ago prior to multiple recent medical issues.  Nursing reports pt's shingles are scabbed over and pt not on precautions.  Pt with L glass eye.  Activity orders: up as tolerated.    Exercises  Gait (see above).      Assessment/Plan    PT Assessment Patient needs continued PT services  PT Diagnosis Generalized weakness;Difficulty walking   PT Problem List Decreased strength;Decreased balance;Decreased mobility   PT Treatment Interventions Gait training;Stair training;Functional mobility training;Therapeutic activities;Therapeutic exercise;Balance training;Patient/family education   PT Goals (Current goals can be found in the Care Plan section) Acute Rehab PT Goals Patient Stated Goal: To get better PT Goal Formulation: With patient Time For Goal Achievement: 04/20/15 Potential to Achieve Goals: Good    Frequency Min 2X/week   Barriers to discharge        Co-evaluation               End of Session Equipment Utilized During Treatment: Gait belt Activity Tolerance: Patient tolerated treatment well Patient left: in chair;with call bell/phone within reach;with chair alarm set;with family/visitor present Nurse Communication: Mobility status (Pt sitting up in recliner chair)         Time: 1450-1530 PT Time Calculation (min) (ACUTE ONLY): 40 min   Charges:   PT Evaluation $Initial PT Evaluation Tier I: 1 Procedure PT Treatments $Therapeutic Exercise: 8-22 mins   PT G CodesLeitha Bleak 04-25-2015, 4:13 PM Leitha Bleak, [place]

## 2015-04-06 NOTE — ED Provider Notes (Signed)
[facility] Emergency Department Provider Note  ____________________________________________  Time seen: Approximately 616 AM  I have reviewed the triage vital signs and the nursing notes.   HISTORY  Chief Complaint Urinary Retention    HPI [name]. is a 81 y.o. male who comes in tonight with urinary retention. The patient was seen twice in the emergency department in the last few days. The first time he was admitted for an ileus due to constipation. The patient's wife reports that he receive some enemas and was able to have good bowel movements. She reports though that he had been having some difficulty with voiding and some abdominal pain. They came in to the emergency department again and was diagnosed with a urinary tract infection and discharged home. The patient's wife reports that he had been having a lot of dribbling but hadn't had a good full 48 in a few days. She reports that tonight he was having some worsened pain, swelling in his abdomen, and swelling in his testicles. She reports that they called his primary care's office and they were told to come into the emergency department for evaluation. The patient's pain was a 10 out of 10 in intensity. He was very swollen and uncomfortable. The patient has a history of ITP and shingles. He was placed on Bactrim and Pyridium for his urinary tract infection.   Past Medical History  Diagnosis Date  . ITP (idiopathic thrombocytopenic purpura)   . Shingles   . Dehydration   . Hypertension   . Prostate cancer   . Hypercholesteremia     There are no active problems to display for this patient.   Past Surgical History  Procedure Laterality Date  . Hernia repair    . Eye surgery    . Artificial left eye    . Joint replacement      Current Outpatient Rx  Name  Route  Sig  Dispense  Refill  . docusate sodium (COLACE) 100 MG capsule   Oral   Take 1 capsule (100 mg total) by mouth 2 (two) times  daily.   60 capsule   2   . dutasteride (AVODART) 0.5 MG capsule   Oral   Take 0.5 mg by mouth daily.         Marland Kitchen gabapentin (NEURONTIN) 100 MG capsule   Oral   Take 100-300 mg by mouth See admin instructions. Take 1 tablet by mouth every morning, take 1 tablet at lunch, take 3 tablets at bedtime.         Marland Kitchen lisinopril (PRINIVIL,ZESTRIL) 20 MG tablet   Oral   Take 20 mg by mouth daily.         . magnesium citrate SOLN   Oral   Take 1 Bottle by mouth once.         . metoCLOPramide (REGLAN) 10 MG tablet   Oral   Take 1 tablet (10 mg total) by mouth 3 (three) times daily with meals.   30 tablet   1   . omeprazole (PRILOSEC) 20 MG capsule   Oral   Take 20 mg by mouth daily.         . phenazopyridine (PYRIDIUM) 200 MG tablet   Oral   Take 1 tablet (200 mg total) by mouth 3 (three) times daily as needed for pain.   6 tablet   0   . pravastatin (PRAVACHOL) 20 MG tablet   Oral   Take 20 mg by mouth daily.         Marland Kitchen  predniSONE (DELTASONE) 20 MG tablet   Oral   Take 10 mg by mouth daily with breakfast.         . sulfamethoxazole-trimethoprim (BACTRIM DS,SEPTRA DS) 800-160 MG per tablet   Oral   Take 1 tablet by mouth 2 (two) times daily.   20 tablet   0   . tamsulosin (FLOMAX) 0.4 MG CAPS capsule   Oral   Take 0.4 mg by mouth daily.         . valACYclovir (VALTREX) 500 MG tablet   Oral   Take 500 mg by mouth 2 (two) times daily.         Marland Kitchen zolpidem (AMBIEN) 5 MG tablet   Oral   Take 5 mg by mouth at bedtime as needed for sleep.           Allergies Review of patient's allergies indicates no known allergies.  History reviewed. No pertinent family history.  Social History History  Substance Use Topics  . Smoking status: Never Smoker   . Smokeless tobacco: Not on file  . Alcohol Use: No    Review of Systems Constitutional: No fever/chills Eyes: No visual changes. ENT: No sore throat. Cardiovascular: Denies chest pain. Respiratory:  Denies shortness of breath. Gastrointestinal: abdominal pain,  constipation. Genitourinary: dysuria, urinary retention, penile and testicular swelling Musculoskeletal: Negative for back pain. Skin: Shingles. Neurological: Negative for headaches, 10-point ROS otherwise negative.  ____________________________________________   PHYSICAL EXAM:  VITAL SIGNS: ED Triage Vitals  Enc Vitals Group     BP 04/06/15 0444 169/91 mmHg     Pulse Rate 04/06/15 0444 109     Resp --      Temp 04/06/15 0444 97.5 F (36.4 C)     Temp Source 04/06/15 0444 Oral     SpO2 04/06/15 0444 96 %     Weight 04/06/15 0444 162 lb (73.483 kg)     Height 04/06/15 0444 5\' 8"  (1.727 m)     Head Cir --      Peak Flow --      Pain Score 04/06/15 0445 6     Pain Loc --      Pain Edu? --      Excl. in [place]? --     Constitutional: Sleeping easily aroused Eyes: Conjunctivae are normal. PERRL. EOMI. Head: Atraumatic. Nose: No congestion/rhinnorhea. Mouth/Throat: Mucous membranes are moist.  Oropharynx non-erythematous. Cardiovascular: Normal rate, regular rhythm. Grossly normal heart sounds.  Good peripheral circulation. Respiratory: Normal respiratory effort.  No retractions. Lungs CTAB. Gastrointestinal: Soft and nontender. No distention. No abdominal bruits. No CVA tenderness. Genitourinary: Swelling of penis and scrotum Foley catheter in place Musculoskeletal: No lower extremity tenderness nor edema.  No joint effusions. Neurologic:  Normal speech and language. No gross focal neurologic deficits are appreciated. Skin:  Skin is warm, dry and intact. No rash noted. Psychiatric: Mood and affect are normal. Behavior normal.  ____________________________________________   LABS (all labs ordered are listed, but only abnormal results are displayed)  Labs Reviewed  URINALYSIS COMPLETEWITH MICROSCOPIC ([place])  - Abnormal; Notable for the following:    Color, Urine AMBER (*)    APPearance CLEAR (*)    Hgb  urine dipstick 3+ (*)    Nitrite POSITIVE (*)    Bacteria, UA FEW (*)    All other components within normal limits  CBC - Abnormal; Notable for the following:    WBC 14.4 (*)    RBC 3.87 (*)    HCT 38.2 (*)  All other components within normal limits  BASIC METABOLIC PANEL - Abnormal; Notable for the following:    Sodium 129 (*)    Chloride 94 (*)    Glucose, Bld 105 (*)    BUN 32 (*)    Creatinine, Ser 2.25 (*)    Calcium 8.8 (*)    GFR calc non Af Amer 26 (*)    GFR calc Af Amer 30 (*)    All other components within normal limits  CULTURE, BLOOD (ROUTINE X 2)  CULTURE, BLOOD (ROUTINE X 2)   ____________________________________________  EKG  none ____________________________________________  RADIOLOGY  none ____________________________________________   PROCEDURES  Procedure(s) performed: None  Critical Care performed: No  ____________________________________________   INITIAL IMPRESSION / ASSESSMENT AND PLAN / ED COURSE  Pertinent labs & imaging results that were available during my care of the patient were reviewed by me and considered in my medical decision making (see chart for details).  The patient comes in with acute urinary retention tonight. The patient had a foley catheter placed on arrival to the ED with large amount of urine emptied from his bladder. The patient did have immediate relief once his urine was emptied from his bladder. Given the fact that the patient has not had a good voided for days I will evaluate a BMP to determine the patient's creatinine. I will also check a CBC. The patient is currently on Bactrim for his UTI.  The patient does have an elevation of his creatinine as well as elevated white blood cell count. I will give the patient dose of ciprofloxacin and admit him to the hospital given the elevation of his creatinine. ____________________________________________   FINAL CLINICAL IMPRESSION(S) / ED DIAGNOSES  Final diagnoses:   Acute renal insufficiency  Urinary Tract infection       Loney Hering, MD 04/06/15 [phone]

## 2015-04-06 NOTE — ED Notes (Signed)
Pt unable to void states last time was 4 hrs ago.

## 2015-04-06 NOTE — H&P (Signed)
[facility] [place] NAME: [name]    MR#:  [id number]  DATE OF BIRTH:  [birth year]  DATE OF ADMISSION:  04/06/2015  PRIMARY CARE PHYSICIAN: BABAOFF, Caryl Bis, MD   REQUESTING/REFERRING PHYSICIAN: Dr. Dahlia Client  CHIEF COMPLAINT:   Chief Complaint  Patient presents with  . Urinary Retention    HISTORY OF PRESENT ILLNESS:  [name]  is a 81 y.o. male with a known history of ITP, recent shingles, prostate cancer and hypertension. He presents back to the ER today. Recently came to the ER for abdominal pain and constipation and was discharged home. He also had a syncopal episode while trying to have a bowel movement.  He came into the hospital last night with lower abdominal pain. He was found to have urinary retention and a Foley catheter was placed. His abdominal pain was relieved once the catheter was placed. Pain was 8 out of 10 intensity no relieving factors. The patient is also noticed swelling of his penis and scrotum. He had not voided and a while I prior to having the catheter placed. At home he was also having constipation.  In the ER he was found to have acute renal failure with a creatinine up at 2.25 with a baseline of 0.89 and previously and an elevated white blood cell count also. ER physician ordered Cipro for possibility of urinary tract infection.  PAST MEDICAL HISTORY:   Past Medical History  Diagnosis Date  . ITP (idiopathic thrombocytopenic purpura)   . Shingles   . Dehydration   . Hypertension   . Prostate cancer   . Hypercholesteremia     PAST SURGICAL HISTORY:   Past Surgical History  Procedure Laterality Date  . Hernia repair    . Eye surgery    . Artificial left eye    . Joint replacement      SOCIAL HISTORY:   History  Substance Use Topics  . Smoking status: Never Smoker   . Smokeless tobacco: Not on file  . Alcohol Use: No    FAMILY HISTORY:  Father had multiple sclerosis mother  died of an unknown type of cancer.  DRUG ALLERGIES:  No Known Allergies  REVIEW OF SYSTEMS:  CONSTITUTIONAL: No fever, fatigue or weakness. Positive for weight loss 23 pounds EYES: No blurred or double vision. 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 No sore throat RESPIRATORY: No cough, shortness of breath, wheezing or hemoptysis.  CARDIOVASCULAR: No chest pain, orthopnea, edema.  GASTROINTESTINAL: Positive for nausea, vomiting 1 week positive for abdominal pain and constipation. No blood in bowel movements GENITOURINARY: No dysuria, hematuria. Difficulty getting urine out, scrotal swelling  ENDOCRINE: No polyuria, nocturia,  HEMATOLOGY: No anemia, easy bruising or bleeding SKIN: Recent shingles MUSCULOSKELETAL: No joint pain or arthritis.   NEUROLOGIC: No tingling, numbness, weakness. Syncope last week PSYCHIATRY: No anxiety or depression.   MEDICATIONS AT HOME:   Prior to Admission medications   Medication Sig Start Date End Date Taking? Authorizing Provider  docusate sodium (COLACE) 100 MG capsule Take 1 capsule (100 mg total) by mouth 2 (two) times daily. 04/02/15 04/01/16  Harvest Dark, MD  dutasteride (AVODART) 0.5 MG capsule Take 0.5 mg by mouth daily.    Historical Provider, MD  gabapentin (NEURONTIN) 100 MG capsule Take 100-300 mg by mouth See admin instructions. Take 1 tablet by mouth every morning, take 1 tablet at lunch, take 3 tablets at bedtime.  04/01/15   Historical Provider, MD  lisinopril (PRINIVIL,ZESTRIL) 20 MG tablet Take 20 mg by mouth daily.    Historical Provider, MD  magnesium citrate SOLN Take 1 Bottle by mouth once.    Historical Provider, MD  metoCLOPramide (REGLAN) 10 MG tablet Take 1 tablet (10 mg total) by mouth 3 (three) times daily with meals. 04/02/15 04/01/16  Harvest Dark, MD  omeprazole (PRILOSEC) 20 MG capsule Take 20 mg by mouth daily.    Historical Provider, MD  phenazopyridine (PYRIDIUM) 200 MG tablet  Take 1 tablet (200 mg total) by mouth 3 (three) times daily as needed for pain. 04/05/15   Pierce Crane Beers, PA-C  pravastatin (PRAVACHOL) 20 MG tablet Take 20 mg by mouth daily.    Historical Provider, MD  predniSONE (DELTASONE) 20 MG tablet Take 10 mg by mouth daily with breakfast.    Historical Provider, MD  sulfamethoxazole-trimethoprim (BACTRIM DS,SEPTRA DS) 800-160 MG per tablet Take 1 tablet by mouth 2 (two) times daily. 04/05/15   Arlyss Repress, PA-C  tamsulosin (FLOMAX) 0.4 MG CAPS capsule Take 0.4 mg by mouth daily.    Historical Provider, MD  valACYclovir (VALTREX) 500 MG tablet Take 500 mg by mouth 2 (two) times daily.    Historical Provider, MD  zolpidem (AMBIEN) 5 MG tablet Take 5 mg by mouth at bedtime as needed for sleep.    Historical Provider, MD      VITAL SIGNS:  Blood pressure 129/92, pulse 88, temperature 97.5 F (36.4 C), temperature source Oral, resp. rate 20, height 5\' 8"  (1.727 m), weight 73.483 kg (162 lb), SpO2 99 %.  PHYSICAL EXAMINATION:  GENERAL:  81 y.o.-year-old patient lying in the bed with no acute distress.  EYES: Right pupil reactive to light. 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 No scleral icterus. Extraocular muscles intact on right eye.  HEENT: Head atraumatic, normocephalic. Oropharynx and nasopharynx clear.  NECK:  Supple, no jugular venous distention. No thyroid enlargement, no tenderness.  LUNGS: Normal breath sounds bilaterally, no wheezing, rales,rhonchi or crepitation. No use of accessory muscles of respiration.  CARDIOVASCULAR: S1, S2 normal. No murmurs, rubs, or gallops.  ABDOMEN: Soft, nontender, nondistended. Bowel sounds present. No organomegaly or mass. Penis and scrotal swelling. EXTREMITIES: No pedal edema, cyanosis, or clubbing.  NEUROLOGIC: Cranial nerves II through XII are intact. Muscle strength 5/5 in all extremities. Sensation intact. Gait not checked.  PSYCHIATRIC: The patient is alert and oriented x 3.  SKIN: No rash, lesion, or ulcer.    LABORATORY PANEL:   CBC  Recent Labs Lab 04/06/15 0701  WBC 14.4*  HGB 13.0  HCT 38.2*  PLT 256   ------------------------------------------------------------------------------------------------------------------  Chemistries   Recent Labs Lab 04/02/15 1013 04/06/15 0701  NA  --  129*  K  --  3.6  CL  --  94*  CO2  --  26  GLUCOSE  --  105*  BUN  --  32*  CREATININE  --  2.25*  CALCIUM  --  8.8*  AST 28  --   ALT 28  --   ALKPHOS 59  --   BILITOT 0.7  --     Cardiac Enzymes  Recent Labs Lab 04/02/15 1013  TROPONINI <0.03    IMPRESSION AND PLAN:   1. Acute renal failure from urinary retention. Foley catheter placed. Check creatinine in the a.m. Gentle IV fluid hydration. Likely will have to follow up with urology as outpatient for Foley removal in one week. Patient is already on Flomax and Avodart.  2. Possibility of UTI we'll send off urine culture and monitor white blood cell count. Empiric Cipro. 3. Constipation we'll put on a bowel regimen to have him move his bowels. 4. Shingles rash continue Valtrex and gabapentin 5. Hypertension essential stable old ACE inhibitor at this point. 6. History of ITP on prednisone. Platelet count good right now. 7. DVT prophylaxis with SCD and teds. No heparin products with history of ITP. 8. PT consultation  All the records are reviewed and case discussed with ED provider. Management plans discussed with the patient, family and they are in agreement.  CODE STATUS: Full code  TOTAL TIME TAKING CARE OF THIS PATIENT: 60 minutes.    [name] M.D on 04/06/2015 at 8:39 AM  Between 7am to 6pm - Pager - [phone]  After 6pm call admission pager [facility]  Office  [phone]  CC: Primary care physician; BABAOFF, Caryl Bis, MD

## 2015-04-06 NOTE — ED Notes (Signed)
Admitting md at bedside

## 2015-04-06 NOTE — ED Notes (Signed)
Received pt from night shift. 

## 2015-04-07 ENCOUNTER — Inpatient Hospital Stay: Payer: PPO

## 2015-04-07 LAB — BASIC METABOLIC PANEL
ANION GAP: 6 (ref 5–15)
BUN: 17 mg/dL (ref 6–20)
CO2: 27 mmol/L (ref 22–32)
Calcium: 8.4 mg/dL — ABNORMAL LOW (ref 8.9–10.3)
Chloride: 102 mmol/L (ref 101–111)
Creatinine, Ser: 1.03 mg/dL (ref 0.61–1.24)
GFR calc non Af Amer: >60 mL/min (ref >60)
Glucose, Bld: 90 mg/dL (ref 65–99)
Potassium: 3.2 mmol/L — ABNORMAL LOW (ref 3.5–5.1)
SODIUM: 135 mmol/L (ref 135–145)

## 2015-04-07 LAB — CBC
HEMATOCRIT: 35.7 % — AB (ref 40.0–52.0)
HEMOGLOBIN: 12.3 g/dL — AB (ref 13.0–18.0)
MCH: 33.9 pg (ref 26.0–34.0)
MCHC: 34.4 g/dL (ref 32.0–36.0)
MCV: 98.7 fL (ref 80.0–100.0)
Platelets: 238 10*3/uL (ref 150–440)
RBC: 3.62 MIL/uL — AB (ref 4.40–5.90)
RDW: 14.2 % (ref 11.5–14.5)
WBC: 8.7 10*3/uL (ref 3.8–10.6)

## 2015-04-07 MED ORDER — POTASSIUM CHLORIDE CRYS ER 20 MEQ PO TBCR
40.0000 meq | EXTENDED_RELEASE_TABLET | Freq: Once | ORAL | Status: AC
  Administered 2015-04-07: 40 meq via ORAL
  Filled 2015-04-07: qty 2

## 2015-04-07 MED ORDER — ENSURE ENLIVE PO LIQD
237.0000 mL | Freq: Two times a day (BID) | ORAL | Status: DC
  Administered 2015-04-07 – 2015-04-08 (×2): 237 mL via ORAL

## 2015-04-07 MED ORDER — TAMSULOSIN HCL 0.4 MG PO CAPS
0.8000 mg | ORAL_CAPSULE | Freq: Every day | ORAL | Status: DC
Start: 2015-04-08 — End: 2015-04-08
  Administered 2015-04-08: 08:00:00 0.8 mg via ORAL
  Filled 2015-04-07: qty 2

## 2015-04-07 NOTE — Progress Notes (Signed)
Patient ID: Bryant Saye., male   DOB: 28-May-1932, 81 y.o.   MRN: [id number] [facility] PROGRESS NOTE  PCP: Marcello Fennel, MD  HPI/Subjective: Patient feeling better today. No abdominal pain. He moved his bowels. Wife and patient very concerned about blood in the urine. Strength feels better today.  Objective: Filed Vitals:   04/07/15 0519  BP: 165/74  Pulse: 74  Temp: 97.9 F (36.6 C)  Resp: 22    Intake/Output Summary (Last 24 hours) at 04/07/15 1009 Last data filed at 04/07/15 0800  Gross per 24 hour  Intake   1270 ml  Output   3425 ml  Net  -2155 ml   Filed Weights   04/06/15 0444 04/06/15 0956  Weight: 73.483 kg (162 lb) 73.982 kg (163 lb 1.6 oz)    ROS: Review of Systems  Constitutional: Negative for fever and chills.  Eyes: Negative for blurred vision.  Respiratory: Negative for cough and shortness of breath.   Cardiovascular: Negative for chest pain.  Gastrointestinal: Negative for nausea, vomiting, diarrhea and constipation.  Genitourinary: Positive for hematuria.  Musculoskeletal: Negative for joint pain.  Neurological: Negative for dizziness and headaches.   Exam: Physical Exam  HENT:  Nose: No mucosal edema.  Mouth/Throat: No oropharyngeal exudate or posterior oropharyngeal edema.  Eyes: Conjunctivae, EOM and lids are normal. Pupils are equal, round, and reactive to light.  Neck: No JVD present. Carotid bruit is not present. No edema present. No thyroid mass and no thyromegaly present.  Cardiovascular: S1 normal and S2 normal.  Exam reveals no gallop.   No murmur heard. Pulses:      Dorsalis pedis pulses are 2+ on the right side, and 2+ on the left side.  Respiratory: No respiratory distress. He has no wheezes. He has no rhonchi. He has no rales.  GI: Soft. Bowel sounds are normal. There is no tenderness.  Genitourinary:  Penis and scrotum still swollen, but less than yesterday.  Musculoskeletal:       Right ankle: He  exhibits no swelling.       Left ankle: He exhibits no swelling.  Lymphadenopathy:    He has no cervical adenopathy.  Neurological: He is alert. No cranial nerve deficit.  Skin: Skin is warm. No rash noted. Nails show no clubbing.  Psychiatric: He has a normal mood and affect.    Data Reviewed: Basic Metabolic Panel:  Recent Labs Lab 04/02/15 1002 04/06/15 0701 04/07/15 0452  NA 130* 129* 135  K 3.3* 3.6 3.2*  CL 96* 94* 102  CO2 25 26 27   GLUCOSE 105* 105* 90  BUN 15 32* 17  CREATININE 0.89 2.25* 1.03  CALCIUM 8.6* 8.8* 8.4*   Liver Function Tests:  Recent Labs Lab 04/02/15 1013  AST 28  ALT 28  ALKPHOS 59  BILITOT 0.7  PROT 7.0  ALBUMIN 4.0    Recent Labs Lab 04/02/15 1013  LIPASE 39   CBC:  Recent Labs Lab 04/02/15 1002 04/06/15 0701 04/07/15 0452  WBC 11.7* 14.4* 8.7  HGB 14.0 13.0 12.3*  HCT 40.4 38.2* 35.7*  MCV 97.5 98.7 98.7  PLT 271 256 238   Cardiac Enzymes:  Recent Labs Lab 04/02/15 1013  TROPONINI <0.03     Recent Results (from the past 240 hour(s))  Blood culture (routine x 2)     Status: None (Preliminary result)   Collection Time: 04/06/15  8:50 AM  Result Value Ref Range Status   Specimen Description BLOOD  Final  Special Requests NONE  Final   Culture NO GROWTH 1 DAY  Final   Report Status PENDING  Incomplete  Blood culture (routine x 2)     Status: None (Preliminary result)   Collection Time: 04/06/15 10:08 AM  Result Value Ref Range Status   Specimen Description BLOOD  Final   Special Requests NONE  Final   Culture NO GROWTH < 24 HOURS  Final   Report Status PENDING  Incomplete     Studies: No results found.  Scheduled Meds: . ciprofloxacin  250 mg Oral BID  . docusate sodium  100 mg Oral BID  . dutasteride  0.5 mg Oral Daily  . gabapentin  100 mg Oral Q breakfast  . gabapentin  100 mg Oral Q lunch  . gabapentin  300 mg Oral QHS  . pantoprazole  40 mg Oral Daily  . pravastatin  20 mg Oral Daily  .  predniSONE  10 mg Oral Q breakfast  . [START ON 04/08/2015] tamsulosin  0.8 mg Oral Daily  . valACYclovir  500 mg Oral BID   Continuous Infusions: . sodium chloride 75 mL/hr at 04/07/15 0957    Assessment/Plan: 1. Acute renal failure secondary to enlarged prostate. Creatinine improved with IV fluid hydration. Need to follow up with urology as outpatient, wife has set up an appointment for around one week. Will increase Flomax to 0.8 mg by mouth daily. Patient already on Avodart. 2. Hematuria- likely secondary to trauma with insertion. I will continue IV fluid hydration today. Potential discharge tomorrow. 3. Possible urinary tract infection- on Cipro. Follow-up urine culture. 4. Constipation-resolved. 5. Shingles drying lesions on valacyclovir and gabapentin. 6. Hyperlipidemia unspecified on pravastatin. 7. Gastroesophageal reflux disease without esophagitis on Protonix.    Code Status:     Code Status Orders        Start     Ordered   04/06/15 0833  Full code   Continuous     04/06/15 0834    Advance Directive Documentation        Most Recent Value   Type of Advance Directive  Healthcare Power of Attorney, Living will   Pre-existing out of facility DNR order (yellow form or pink MOST form)     "MOST" Form in Place?       Family Communication: Wife at bedside Disposition Plan: home with home health, case discussed with care management team  Time spent:25 minutes  [name]  [facility] [place]

## 2015-04-07 NOTE — Plan of Care (Signed)
Problem: Discharge Progression Outcomes Goal: Discharge plan in place and appropriate Individualization of Care Pt prefers to be called [name] of ITP sees Dr. Berton Lan, Prostate Cancer sees Dr. Ma Hillock, shingles--dry at this time, high cholesterol & HTN controlled by home medications   Pt alert & oriented understanding how to utilize the call system for assistance out of bed due to recent falls at home. Goal: Other Discharge Outcomes/Goals Goal: Other Discharge Outcomes/Goals Plan of Care Progress to Goal:  Urine pink tinged with clots this am, urine clearing to amber with some clots, pt ambulating in hall tolerating well, ivf decreased to 50 ml/hour.  Pt for d/c home tomorrow with foley with home health and to see urologist at [facility] on Monday

## 2015-04-07 NOTE — Care Management Note (Signed)
Case Management Note  Patient Details  Name: Antonio Creswell. MRN: [id number] Date of Birth: 1932/04/18  Subjective/Objective:                  Patient sitting in bed eating breakfast without difficulty. Patient plans to return home with his wife at discharge; agrees to home health RN if needed for Foley cath care. He states he is independent with ambulation. PT is not recommending PT at home or any equipment. He has a rolling walker at home if needed. He has never used Home Health before. Follow up anticipated per patient with [facility] for prostate surgery. His PCP is Dr. Loney Hering. He states he can afford his Rx. RN states patient with pink tinged urine coming out of foley.   Action/Plan: RNCM to follow for home health needs.   Expected Discharge Date:                  Expected Discharge Plan:     In-House Referral:     Discharge planning Services  CM Consult  Post Acute Care Choice:    Choice offered to:  Patient, Spouse  DME Arranged:    DME Agency:     HH Arranged:    [place] Agency:     Status of Service:     Medicare Important Message Given:  Yes Date Medicare IM Given:  04/07/15 Medicare IM give by:  Marshell Garfinkel Date Additional Medicare IM Given:    Additional Medicare Important Message give by:     If discussed at [place] of Stay Meetings, dates discussed:    Additional Comments:  Marshell Garfinkel, RN 04/07/2015, 10:35 AM

## 2015-04-07 NOTE — Progress Notes (Signed)
Pt ambulated around nurses station x 2 with standby assist, tolerated well. Donnita Falls, RN 04/07/2015. 1:15 PM

## 2015-04-07 NOTE — Plan of Care (Signed)
Problem: Discharge Progression Outcomes Goal: Discharge plan in place and appropriate Individualization of Care Pt prefers to be called [name] of ITP sees Dr. Berton Lan, Prostate Cancer sees Dr. Ma Hillock, shingles--dry at this time, high cholesterol & HTN controlled by home medications  Pt alert & oriented understanding how to utilize the call system for assistance out of bed due to recent falls at home. Goal: Other Discharge Outcomes/Goals Plan of care progress to goals: Pain: c/o low back pain relieved by repositioning in bed  Hemodynamically: Vital signs stable, NS@75mL /hr for hydration, catheter output has slowed down over night, instructed to limit PO water intake due to Na 129 Activity: pt walked around nurses station once with minimal assistance with RN No fall or injury this shift

## 2015-04-07 NOTE — Progress Notes (Addendum)
Initial Nutrition Assessment  DOCUMENTATION CODES:     INTERVENTION: Meals and Snacks: Cater to patient preferences Medical Food Supplement Therapy: will recommend Ensure Enlive (each supplement provides 350kcal and 20 grams of protein) BID  NUTRITION DIAGNOSIS:  Inadequate oral intake related to  (decreased appetite) as evidenced by per patient/family report  GOAL:  Patient will meet greater than or equal to 90% of their needs; improving today per pt  MONITOR:   (Energy Intake, Electrolyte and Renal Profile, Anthropometrics, UOP)  REASON FOR ASSESSMENT:  Malnutrition Screening Tool    ASSESSMENT:  Pt admitted with acute renal failure secondary to urinary retention, with possible UTI. Pt s/p foley cath placement. PMHx:  Past Medical History  Diagnosis Date  . ITP (idiopathic thrombocytopenic purpura)   . Shingles   . Dehydration   . Hypertension   . Prostate cancer   . Hypercholesteremia    PO intake: pt reports etaing well since admission and 100% of lunch today. Recorded po intake 90-100% of meals yesterday. Pt reports since January of this year less appetite than usual. Pt reports multiple medical issues that have caused flucuation in appetite. Pt reports usually still eating meals but may be less at times.   Height:  Ht Readings from Last 1 Encounters:  04/06/15 5\' 8"  (1.727 m)   Pt reports since January approximately 15lbs weight loss. Pt reports UBW of 176-178lbs. (7% weight loss in 5 months)  Weight:  Wt Readings from Last 1 Encounters:  04/06/15 163 lb 1.6 oz (73.982 kg)    Ideal Body Weight:     Wt Readings from Last 10 Encounters:  04/06/15 163 lb 1.6 oz (73.982 kg)  04/05/15 162 lb (73.483 kg)  04/02/15 162 lb (73.483 kg)  03/23/15 165 lb (74.844 kg)    BMI:  Body mass index is 24.81 kg/(m^2).   Nutrition-Focused physical exam completed. Findings are WDL for fat depletion, muscle depletion, and edema.    Estimated Nutritional  Needs:  Kcal:  1873-2213kcals, BEE: 1418kcals, TEE: (IF 1.1-1.3)(AF 1.2)  Protein:  74-89g protein (1.0-1.2g/kg)  Fluid:  1850-2262mL of fluid (25-62mL/kg)  Skin:  Reviewed, no issues  Diet Order:  Diet regular Room service appropriate?: Yes; Fluid consistency:: Thin  EDUCATION NEEDS:  No education needs identified at this time   Intake/Output Summary (Last 24 hours) at 04/07/15 1408 Last data filed at 04/07/15 1200  Gross per 24 hour  Intake   1270 ml  Output   4125 ml  Net  -2855 ml    Last BM:  5/18  MODERATE Care Level  Dwyane Luo, RD, LDN Pager [phone]

## 2015-04-07 NOTE — Progress Notes (Signed)
Physical Therapy Treatment Patient Details Name: [name]. MRN: [id number] DOB: September 07, 1932 Today's Date: 04/07/2015    History of Present Illness Pt is an 81 y.o. male admitted with ARF from urinary retention and foley catheter placed.  Of note, pt went to ED 03/23/15 with shingles and vomiting; ED 04/02/15 with near syncopal episode standing after attempting BM, abdominal pain, and constipation; ED 04/05/15 with urinary retention; and back to ED early AM 04/06/15 and admitted.    PT Comments    Pt appearing steady with ambulation today (no AD) with SBA for IV pole management (pt managing foley catheter today).  Reviewed foley catheter management (in terms of functional mobility) and pt and pt's wife verbalizing good understanding.  Pt appears safe to discharge home without AD when medically appropriate.  No further PT needs identified after discharge from hospital.  Follow Up Recommendations  No PT follow up     Equipment Recommendations       Recommendations for Other Services       Precautions / Restrictions Precautions Precautions: Fall Restrictions Weight Bearing Restrictions: No    Mobility  Bed Mobility Overal bed mobility: Modified Independent                Transfers Overall transfer level: Modified independent Equipment used: None Transfers: Sit to/from [facility] Sit to Stand: Modified independent (Device/Increase time) Stand pivot transfers: Modified independent (Device/Increase time)       General transfer comment: pt holding foley catheter for transfers  Ambulation/Gait Ambulation/Gait assistance: Supervision (SBA for IV pole; pt holding foley catheter) Ambulation Distance (Feet):  (270 feet; 180 feet) Assistive device: None Gait Pattern/deviations: WFL(Within Functional Limits)         Stairs Stairs: Yes Stairs assistance: Supervision (SBA for IV line/pole; pt holding foley catheter) Stair Management: One rail  Right Number of Stairs: 4 General stair comments: step to; steady  Wheelchair Mobility    Modified Rankin (Stroke Patients Only)       Balance Overall balance assessment: Independent  Tinetti Balance Assessment:  28/28 score indicating pt is at low risk for falls                                  Cognition Arousal/Alertness: Awake/alert Behavior During Therapy: WFL for tasks assessed/performed Overall Cognitive Status: Within Functional Limits for tasks assessed                      Exercises      General Comments  Nursing cleared pt for participation in physical therapy.  Pt requesting to go for a walk.      Pertinent Vitals/Pain Pain Assessment: No/denies pain  Vitals: at rest HR 73 bpm; O2 95% on room air; after activity HR 92 bpm; O2 98% on room air.     Home Living                      Prior Function            PT Goals (current goals can now be found in the care plan section) Acute Rehab PT Goals Patient Stated Goal: To get better PT Goal Formulation: With patient Time For Goal Achievement: 04/20/15 Potential to Achieve Goals: Good Progress towards PT goals: Progressing toward goals    Frequency  Min 2X/week    PT Plan Current plan remains appropriate    Co-evaluation  End of Session Equipment Utilized During Treatment: Gait belt Activity Tolerance: Patient tolerated treatment well Patient left: in chair;with call bell/phone within reach;with chair alarm set;with family/visitor present     Time: 2548-6282 PT Time Calculation (min) (ACUTE ONLY): 25 min  Charges:  $Gait Training: 8-22 mins $Therapeutic Exercise: 8-22 mins                    G CodesLeitha Bleak 05-01-15, 4:57 PM Leitha Bleak, [place]

## 2015-04-08 LAB — URINE CULTURE: Culture: NO GROWTH

## 2015-04-08 LAB — BASIC METABOLIC PANEL
Anion gap: 5 (ref 5–15)
BUN: 13 mg/dL (ref 6–20)
CALCIUM: 8.3 mg/dL — AB (ref 8.9–10.3)
CO2: 26 mmol/L (ref 22–32)
Chloride: 103 mmol/L (ref 101–111)
Creatinine, Ser: 0.88 mg/dL (ref 0.61–1.24)
GFR calc Af Amer: >60 mL/min (ref >60)
GFR calc non Af Amer: >60 mL/min (ref >60)
Glucose, Bld: 89 mg/dL (ref 65–99)
Potassium: 3.3 mmol/L — ABNORMAL LOW (ref 3.5–5.1)
SODIUM: 134 mmol/L — AB (ref 135–145)

## 2015-04-08 LAB — PLATELET COUNT: Platelets: 274 10*3/uL (ref 150–440)

## 2015-04-08 LAB — HEMOGLOBIN: Hemoglobin: 12 g/dL — ABNORMAL LOW (ref 13.0–18.0)

## 2015-04-08 MED ORDER — POTASSIUM CHLORIDE CRYS ER 20 MEQ PO TBCR
40.0000 meq | EXTENDED_RELEASE_TABLET | Freq: Once | ORAL | Status: AC
  Administered 2015-04-08: 08:00:00 40 meq via ORAL
  Filled 2015-04-08: qty 2

## 2015-04-08 MED ORDER — TAMSULOSIN HCL 0.4 MG PO CAPS: 0.8000 mg | ORAL_CAPSULE | Freq: Every day | ORAL | Status: DC

## 2015-04-08 MED ORDER — CIPROFLOXACIN HCL 500 MG PO TABS: 500.0000 mg | ORAL_TABLET | Freq: Two times a day (BID) | ORAL | Status: DC

## 2015-04-08 MED ORDER — ENSURE ENLIVE PO LIQD: 237.0000 mL | Freq: Two times a day (BID) | ORAL | Status: DC

## 2015-04-08 MED ORDER — POLYETHYLENE GLYCOL 3350 17 G PO PACK: 17.0000 g | PACK | Freq: Every day | ORAL | Status: DC | PRN

## 2015-04-08 NOTE — Discharge Summary (Signed)
[name] NAME: [name]    MR#:  [id number]  DATE OF BIRTH:  03-05-1932  DATE OF ADMISSION:  04/06/2015 ADMITTING PHYSICIAN: [name], MD  DATE OF DISCHARGE: 04/08/2015  PRIMARY CARE PHYSICIAN: BABAOFF, MARC E, MD    ADMISSION DIAGNOSIS:  Urinary retention [R33.9] Acute renal insufficiency [N28.9] Acute cystitis with hematuria [N30.01]  DISCHARGE DIAGNOSIS:  Active Problems:   Acute renal failure   SECONDARY DIAGNOSIS:   Past Medical History  Diagnosis Date  . ITP (idiopathic thrombocytopenic purpura)   . Shingles   . Dehydration   . Hypertension   . Prostate cancer   . Hypercholesteremia     HOSPITAL COURSE:   1. Acute renal failure secondary to obstruction with BPH. Improved with Foley placement and IV fluid hydration. 2. Acute urinary retention requiring Foley catheter placement. Bladder drained out 3 L once Foley was placed. IV fluid hydration was given. Patient's Flomax was increased to 0.8 mg daily. Patient will be sent home with home health for Foley care. Patient will follow-up with his urologist in 1 week for further management and possible voiding trial. Patient will be continued on Proscar. Hematuria likely from Foley trauma which cleared upon discharge. Empiric Cipro will be given. Nurse to teach patient and patient's wife about Foley care and changing to leg bag. 3. Constipation-resolved with medications. 4. Shingles with postherpetic neuralgia: On Valtrex and gabapentin. 5. History of ITP: On low-dose prednisone. Platelet count acceptable. 6 essential hypertension can restart lisinopril. 7. Hyperlipidemia on pravastatin. 8. Gastroesophageal reflux disease without esophagitis continue omeprazole.  DISCHARGE CONDITIONS:   Satisfactory  CONSULTS OBTAINED:    none  DRUG ALLERGIES:  No Known Allergies  DISCHARGE MEDICATIONS:   Current Discharge Medication List    START  taking these medications   Details  ciprofloxacin (CIPRO) 500 MG tablet Take 1 tablet (500 mg total) by mouth 2 (two) times daily. Qty: 6 tablet, Refills: 0    feeding supplement, ENSURE ENLIVE, (ENSURE ENLIVE) LIQD Take 237 mLs by mouth 2 (two) times daily between meals. Qty: 237 mL, Refills: 12    polyethylene glycol (MIRALAX / GLYCOLAX) packet Take 17 g by mouth daily as needed for mild constipation. Qty: 14 each, Refills: 0      CONTINUE these medications which have CHANGED   Details  tamsulosin (FLOMAX) 0.4 MG CAPS capsule Take 2 capsules (0.8 mg total) by mouth daily. Qty: 60 capsule, Refills: 0      CONTINUE these medications which have NOT CHANGED   Details  acetaminophen (TYLENOL) 500 MG tablet Take 500 mg by mouth every 6 (six) hours as needed for mild pain.    docusate sodium (COLACE) 100 MG capsule Take 1 capsule (100 mg total) by mouth 2 (two) times daily. Qty: 60 capsule, Refills: 2    dutasteride (AVODART) 0.5 MG capsule Take 0.5 mg by mouth daily.    gabapentin (NEURONTIN) 100 MG capsule Take 100-300 mg by mouth See admin instructions. Take 1 tablet by mouth every morning, take 1 tablet at lunch, take 3 tablets at bedtime.    lisinopril (PRINIVIL,ZESTRIL) 20 MG tablet Take 20 mg by mouth daily.    metoCLOPramide (REGLAN) 10 MG tablet Take 1 tablet (10 mg total) by mouth 3 (three) times daily with meals. Qty: 30 tablet, Refills: 1    omeprazole (PRILOSEC) 20 MG capsule Take 20 mg by mouth daily.    pravastatin (PRAVACHOL) 20 MG tablet Take 20 mg  by mouth daily.    predniSONE (DELTASONE) 20 MG tablet Take 10 mg by mouth daily with breakfast.    valACYclovir (VALTREX) 500 MG tablet Take 500 mg by mouth 2 (two) times daily.    zolpidem (AMBIEN) 5 MG tablet Take 5 mg by mouth at bedtime as needed for sleep.      STOP taking these medications     magnesium citrate SOLN      phenazopyridine (PYRIDIUM) 200 MG tablet      sulfamethoxazole-trimethoprim  (BACTRIM DS,SEPTRA DS) 800-160 MG per tablet      traMADol (ULTRAM) 50 MG tablet          DISCHARGE INSTRUCTIONS:   Follow-up with your urologist in 1 week  follow up with your PMD in 2 weeks  If you experience worsening of your admission symptoms, develop shortness of breath, life threatening emergency, suicidal or homicidal thoughts you must seek medical attention immediately by calling 911 or calling your MD immediately  if symptoms less severe.  You Must read complete instructions/literature along with all the possible adverse reactions/side effects for all the Medicines you take and that have been prescribed to you. Take any new Medicines after you have completely understood and accept all the possible adverse reactions/side effects.   Please note  You were cared for by a hospitalist during your hospital stay. If you have any questions about your discharge medications or the care you received while you were in the hospital after you are discharged, you can call the unit and asked to speak with the hospitalist on call if the hospitalist that took care of you is not available. Once you are discharged, your primary care physician will handle any further medical issues. Please note that NO REFILLS for any discharge medications will be authorized once you are discharged, as it is imperative that you return to your primary care physician (or establish a relationship with a primary care physician if you do not have one) for your aftercare needs so that they can reassess your need for medications and monitor your lab values.    Today   CHIEF COMPLAINT:   Chief Complaint  Patient presents with  . Urinary Retention    HISTORY OF PRESENT ILLNESS:  [name]  is a 81 y.o. male with a known history of BPH presented with acute urinary retention and found to be in acute renal failure.   VITAL SIGNS:  Blood pressure 149/79, pulse 69, temperature 97.7 F (36.5 C), temperature source  Oral, resp. rate 20, height 5\' 8"  (1.727 m), weight 73.982 kg (163 lb 1.6 oz), SpO2 97 %.  I/O:   Intake/Output Summary (Last 24 hours) at 04/08/15 0845 Last data filed at 04/08/15 0500  Gross per 24 hour  Intake    480 ml  Output   3800 ml  Net  -3320 ml    PHYSICAL EXAMINATION:  GENERAL:  81 y.o.-year-old patient lying in the bed with no acute distress.  EYES: Pupils equal, round, reactive to light and accommodation. No scleral icterus. Extraocular muscles intact.  HEENT: Head atraumatic, normocephalic. Oropharynx and nasopharynx clear.  NECK:  Supple, no jugular venous distention. No thyroid enlargement, no tenderness.  LUNGS: Normal breath sounds bilaterally, no wheezing, rales,rhonchi or crepitation. No use of accessory muscles of respiration.  CARDIOVASCULAR: S1, S2 normal. No murmurs, rubs, or gallops.  ABDOMEN: Soft, non-tender, non-distended. Bowel sounds present. No organomegaly or mass.  EXTREMITIES: No pedal edema, cyanosis, or clubbing.  NEUROLOGIC: Cranial nerves II  through XII are intact. Muscle strength 5/5 in all extremities. Sensation intact. Gait not checked.  PSYCHIATRIC: The patient is alert and oriented x 3.  SKIN: No obvious rash, lesion, or ulcer.   DATA REVIEW:   CBC  Recent Labs Lab 04/07/15 0452 04/08/15 0514  WBC 8.7  --   HGB 12.3* 12.0*  HCT 35.7*  --   PLT 238 274    Chemistries   Recent Labs Lab 04/02/15 1013  04/08/15 0514  NA  --   < > 134*  K  --   < > 3.3*  CL  --   < > 103  CO2  --   < > 26  GLUCOSE  --   < > 89  BUN  --   < > 13  CREATININE  --   < > 0.88  CALCIUM  --   < > 8.3*  AST 28  --   --   ALT 28  --   --   ALKPHOS 59  --   --   BILITOT 0.7  --   --   < > = values in this interval not displayed.  Cardiac Enzymes  Recent Labs Lab 04/02/15 1013  TROPONINI <0.03    Microbiology Results  Results for orders placed or performed during the hospital encounter of 04/06/15  Urine culture     Status: None  (Preliminary result)   Collection Time: 04/06/15  5:20 AM  Result Value Ref Range Status   Specimen Description URINE, CLEAN CATCH  Final   Special Requests NONE  Final   Culture NO GROWTH < 24 HOURS  Final   Report Status PENDING  Incomplete  Blood culture (routine x 2)     Status: None (Preliminary result)   Collection Time: 04/06/15  8:50 AM  Result Value Ref Range Status   Specimen Description BLOOD  Final   Special Requests NONE  Final   Culture NO GROWTH 1 DAY  Final   Report Status PENDING  Incomplete  Blood culture (routine x 2)     Status: None (Preliminary result)   Collection Time: 04/06/15 10:08 AM  Result Value Ref Range Status   Specimen Description BLOOD  Final   Special Requests NONE  Final   Culture NO GROWTH < 24 HOURS  Final   Report Status PENDING  Incomplete    Management plans discussed with the patient, family and they are in agreement.  CODE STATUS:     Code Status Orders        Start     Ordered   04/06/15 0833  Full code   Continuous     04/06/15 0834    Advance Directive Documentation        Most Recent Value   Type of Advance Directive  Healthcare Power of Attorney, Living will   Pre-existing out of facility DNR order (yellow form or pink MOST form)     "MOST" Form in Place?        TOTAL TIME TAKING CARE OF THIS PATIENT: 40 minutes.    [name] M.D on 04/08/2015 at 8:45 AM  Between 7am to 6pm - Pager - [phone]  After 6pm go to [email or web address] - password [email or web address] [place] [facility]  Office  [phone]  CC: Primary care physician; BABAOFF, Caryl Bis, MD

## 2015-04-08 NOTE — Care Management (Signed)
Patient discharging home today with urinary Foley Cath and his wife. He has selected [name] for [facility]. I have notified Holley Dexter with [facility] of patient discharge need. No further RNCM needs.

## 2015-04-08 NOTE — Discharge Instructions (Signed)
Acute Urinary Retention °Acute urinary retention is the temporary inability to urinate. °This is a common problem in older men. As men age their prostates become larger and block the flow of urine from the bladder. This is usually a problem that has come on gradually.  °HOME CARE INSTRUCTIONS °If you are sent home with a Foley catheter and a drainage system, you will need to discuss the best course of action with your health care provider. While the catheter is in, maintain a good intake of fluids. Keep the drainage bag emptied and lower than your catheter. This is so that contaminated urine will not flow back into your bladder, which could lead to a urinary tract infection. °There are two main types of drainage bags. One is a large bag that usually is used at night. It has a good capacity that will allow you to sleep through the night without having to empty it. The second type is called a leg bag. It has a smaller capacity, so it needs to be emptied more frequently. However, the main advantage is that it can be attached by a leg strap and can go underneath your clothing, allowing you the freedom to move about or leave your home. °Only take over-the-counter or prescription medicines for pain, discomfort, or fever as directed by your health care provider.  °SEEK MEDICAL CARE IF: °· You develop a low-grade fever. °· You experience spasms or leakage of urine with the spasms. °SEEK IMMEDIATE MEDICAL CARE IF:  °· You develop chills or fever. °· Your catheter stops draining urine. °· Your catheter falls out. °· You start to develop increased bleeding that does not respond to rest and increased fluid intake. °MAKE SURE YOU: °· Understand these instructions. °· Will watch your condition. °· Will get help right away if you are not doing well or get worse. °Document Released: 02/12/2001 Document Revised: 11/11/2013 Document Reviewed: 04/17/2013 °ExitCare® Patient Information ©2015 [employer]. This information is not  intended to replace advice given to you by your health care provider. Make sure you discuss any questions you have with your health care provider. ° °

## 2015-04-08 NOTE — Plan of Care (Signed)
Problem: Discharge Progression Outcomes Goal: Discharge plan in place and appropriate Individualization of Care Individualization of Care Pt prefers to be called [name] of ITP sees Dr. Berton Lan, Prostate Cancer sees Dr. Ma Hillock, shingles--dry at this time, high cholesterol & HTN controlled by home medications    Pt alert & oriented understanding how to utilize the call system for assistance out of bed due to recent falls at home Goal: Other Discharge Outcomes/Goals Plan of care progress to goal for: Acute renal failure - Continues on IV fluids. - Foley in place. - No complaints of pain Will continue to monitor.

## 2015-04-11 LAB — CULTURE, BLOOD (ROUTINE X 2)
Culture: NO GROWTH
Culture: NO GROWTH

## 2015-04-28 ENCOUNTER — Inpatient Hospital Stay: Payer: PPO | Attending: Internal Medicine

## 2015-04-28 DIAGNOSIS — D696 Thrombocytopenia, unspecified: Secondary | ICD-10-CM | POA: Insufficient documentation

## 2015-04-28 DIAGNOSIS — D649 Anemia, unspecified: Secondary | ICD-10-CM | POA: Diagnosis present

## 2015-04-28 DIAGNOSIS — D693 Immune thrombocytopenic purpura: Secondary | ICD-10-CM

## 2015-04-28 LAB — CBC WITH DIFFERENTIAL/PLATELET
BASOS ABS: 0 10*3/uL (ref 0–0.1)
Basophils Relative: 1 %
EOS ABS: 0.1 10*3/uL (ref 0–0.7)
EOS PCT: 2 %
HCT: 37.5 % — ABNORMAL LOW (ref 40.0–52.0)
Hemoglobin: 12.8 g/dL — ABNORMAL LOW (ref 13.0–18.0)
LYMPHS PCT: 30 %
Lymphs Abs: 2 10*3/uL (ref 1.0–3.6)
MCH: 34 pg (ref 26.0–34.0)
MCHC: 34.2 g/dL (ref 32.0–36.0)
MCV: 99.6 fL (ref 80.0–100.0)
MONO ABS: 0.7 10*3/uL (ref 0.2–1.0)
MONOS PCT: 10 %
Neutro Abs: 3.8 10*3/uL (ref 1.4–6.5)
Neutrophils Relative %: 57 %
PLATELETS: 175 10*3/uL (ref 150–440)
RBC: 3.76 MIL/uL — ABNORMAL LOW (ref 4.40–5.90)
RDW: 15.5 % — AB (ref 11.5–14.5)
WBC: 6.6 10*3/uL (ref 3.8–10.6)

## 2015-05-19 ENCOUNTER — Other Ambulatory Visit: Payer: Self-pay | Admitting: Internal Medicine

## 2015-05-19 ENCOUNTER — Inpatient Hospital Stay: Payer: PPO

## 2015-05-19 DIAGNOSIS — D649 Anemia, unspecified: Secondary | ICD-10-CM | POA: Diagnosis not present

## 2015-05-19 DIAGNOSIS — D693 Immune thrombocytopenic purpura: Secondary | ICD-10-CM

## 2015-05-19 LAB — CBC WITH DIFFERENTIAL/PLATELET
BASOS ABS: 0 10*3/uL (ref 0–0.1)
BASOS PCT: 0 %
EOS ABS: 0.1 10*3/uL (ref 0–0.7)
EOS PCT: 2 %
HCT: 38.7 % — ABNORMAL LOW (ref 40.0–52.0)
Hemoglobin: 13.1 g/dL (ref 13.0–18.0)
Lymphocytes Relative: 24 %
Lymphs Abs: 2.2 10*3/uL (ref 1.0–3.6)
MCH: 33.8 pg (ref 26.0–34.0)
MCHC: 33.8 g/dL (ref 32.0–36.0)
MCV: 100 fL (ref 80.0–100.0)
Monocytes Absolute: 0.9 10*3/uL (ref 0.2–1.0)
Monocytes Relative: 10 %
Neutro Abs: 5.6 10*3/uL (ref 1.4–6.5)
Neutrophils Relative %: 64 %
PLATELETS: 239 10*3/uL (ref 150–440)
RBC: 3.87 MIL/uL — ABNORMAL LOW (ref 4.40–5.90)
RDW: 15.5 % — AB (ref 11.5–14.5)
WBC: 8.9 10*3/uL (ref 3.8–10.6)

## 2015-05-25 ENCOUNTER — Other Ambulatory Visit: Payer: Self-pay

## 2015-05-25 ENCOUNTER — Inpatient Hospital Stay: Payer: PPO | Attending: Internal Medicine

## 2015-05-25 ENCOUNTER — Other Ambulatory Visit: Payer: Self-pay | Admitting: Internal Medicine

## 2015-05-25 ENCOUNTER — Telehealth: Payer: Self-pay

## 2015-05-25 DIAGNOSIS — D696 Thrombocytopenia, unspecified: Secondary | ICD-10-CM | POA: Diagnosis not present

## 2015-05-25 DIAGNOSIS — D649 Anemia, unspecified: Secondary | ICD-10-CM | POA: Insufficient documentation

## 2015-05-25 DIAGNOSIS — D693 Immune thrombocytopenic purpura: Secondary | ICD-10-CM

## 2015-05-25 LAB — CBC WITH DIFFERENTIAL/PLATELET
Basophils Absolute: 0 10*3/uL (ref 0–0.1)
Basophils Relative: 0 %
EOS ABS: 0.1 10*3/uL (ref 0–0.7)
EOS PCT: 1 %
HCT: 39.3 % — ABNORMAL LOW (ref 40.0–52.0)
HEMOGLOBIN: 13.1 g/dL (ref 13.0–18.0)
Lymphocytes Relative: 14 %
Lymphs Abs: 1.7 10*3/uL (ref 1.0–3.6)
MCH: 33.4 pg (ref 26.0–34.0)
MCHC: 33.3 g/dL (ref 32.0–36.0)
MCV: 100.3 fL — AB (ref 80.0–100.0)
MONO ABS: 0.7 10*3/uL (ref 0.2–1.0)
MONOS PCT: 6 %
Neutro Abs: 9.6 10*3/uL — ABNORMAL HIGH (ref 1.4–6.5)
Neutrophils Relative %: 79 %
Platelets: 193 10*3/uL (ref 150–440)
RBC: 3.91 MIL/uL — ABNORMAL LOW (ref 4.40–5.90)
RDW: 15.6 % — ABNORMAL HIGH (ref 11.5–14.5)
WBC: 12.1 10*3/uL — ABNORMAL HIGH (ref 3.8–10.6)

## 2015-05-25 NOTE — Telephone Encounter (Signed)
Platelet count remains in normal range today. Decrease Prednisone dose to 40 mg daily. Thanks.

## 2015-05-25 NOTE — Telephone Encounter (Signed)
Called and left message with patient and his wife that I have faxed his lab results from today to Dr. Josetta Huddle. I also advised them that Dr. Ma Hillock  would like to decrease the Prednisone dose from 60mg  to 40mg . Also informed them of plt. count from today.

## 2015-05-26 NOTE — Telephone Encounter (Signed)
Called in Rx of Prednisone 20mg  tabs take as directed to [facility].

## 2015-06-02 ENCOUNTER — Telehealth: Payer: Self-pay | Admitting: *Deleted

## 2015-06-02 ENCOUNTER — Inpatient Hospital Stay: Payer: PPO

## 2015-06-02 ENCOUNTER — Other Ambulatory Visit: Payer: Self-pay | Admitting: *Deleted

## 2015-06-02 DIAGNOSIS — D693 Immune thrombocytopenic purpura: Secondary | ICD-10-CM

## 2015-06-02 DIAGNOSIS — D649 Anemia, unspecified: Secondary | ICD-10-CM | POA: Diagnosis not present

## 2015-06-02 LAB — ABO/RH: ABO/RH(D): A POS

## 2015-06-02 LAB — CBC WITH DIFFERENTIAL/PLATELET
BASOS PCT: 0 %
Basophils Absolute: 0 10*3/uL (ref 0–0.1)
EOS PCT: 2 %
Eosinophils Absolute: 0.2 10*3/uL (ref 0–0.7)
HCT: 25 % — ABNORMAL LOW (ref 40.0–52.0)
HEMOGLOBIN: 8.4 g/dL — AB (ref 13.0–18.0)
LYMPHS PCT: 11 %
Lymphs Abs: 1.3 10*3/uL (ref 1.0–3.6)
MCH: 34.6 pg — ABNORMAL HIGH (ref 26.0–34.0)
MCHC: 33.5 g/dL (ref 32.0–36.0)
MCV: 103.3 fL — ABNORMAL HIGH (ref 80.0–100.0)
Monocytes Absolute: 0.7 10*3/uL (ref 0.2–1.0)
Monocytes Relative: 6 %
NEUTROS PCT: 81 %
Neutro Abs: 10 10*3/uL — ABNORMAL HIGH (ref 1.4–6.5)
Platelets: 272 10*3/uL (ref 150–440)
RBC: 2.42 MIL/uL — ABNORMAL LOW (ref 4.40–5.90)
RDW: 15.2 % — ABNORMAL HIGH (ref 11.5–14.5)
WBC: 12.2 10*3/uL — ABNORMAL HIGH (ref 3.8–10.6)

## 2015-06-02 LAB — SAMPLE TO BLOOD BANK

## 2015-06-02 NOTE — Telephone Encounter (Signed)
Wife called today and asked about having lab done today.  Pt had TURP on Thursday of last week and had to go back in on Friday.  Pt had rough weekend and is very weak and tired.  She called MD at [facility] and they wondered if he cna come over and get anemia and plt check to make sure he is getting better with levels.  Spoke to [place] and he is agreeable to a hold tube and cbc. Explained this to wife and she will have son bring him at 10:30.  Also explained to wife that pt should be on 10 mg prednisone now and she said because he has so much going on she had kept him on 20 mg and will start him on 10 mg tom. Unless something comes on lab work that it needs to change and I will call her later today with results. She is agreeable to plan.

## 2015-06-09 ENCOUNTER — Inpatient Hospital Stay: Payer: PPO

## 2015-06-09 DIAGNOSIS — D649 Anemia, unspecified: Secondary | ICD-10-CM | POA: Diagnosis not present

## 2015-06-09 DIAGNOSIS — D693 Immune thrombocytopenic purpura: Secondary | ICD-10-CM

## 2015-06-09 LAB — CBC WITH DIFFERENTIAL/PLATELET
BASOS ABS: 0.1 10*3/uL (ref 0–0.1)
Basophils Relative: 1 %
EOS ABS: 0.3 10*3/uL (ref 0–0.7)
Eosinophils Relative: 3 %
HEMATOCRIT: 27.3 % — AB (ref 40.0–52.0)
HEMOGLOBIN: 9.1 g/dL — AB (ref 13.0–18.0)
LYMPHS PCT: 17 %
Lymphs Abs: 1.8 10*3/uL (ref 1.0–3.6)
MCH: 33.9 pg (ref 26.0–34.0)
MCHC: 33.4 g/dL (ref 32.0–36.0)
MCV: 101.5 fL — AB (ref 80.0–100.0)
MONO ABS: 0.9 10*3/uL (ref 0.2–1.0)
Monocytes Relative: 9 %
Neutro Abs: 7.6 10*3/uL — ABNORMAL HIGH (ref 1.4–6.5)
Neutrophils Relative %: 70 %
Platelets: 417 10*3/uL (ref 150–440)
RBC: 2.69 MIL/uL — AB (ref 4.40–5.90)
RDW: 14.7 % — ABNORMAL HIGH (ref 11.5–14.5)
WBC: 10.7 10*3/uL — ABNORMAL HIGH (ref 3.8–10.6)

## 2015-06-09 NOTE — Telephone Encounter (Signed)
She called to see what plt count was.  I told her it was 417, hgb was 9.1.  Wife thought the plt was too high. I explained to her that a lot of times it is from having surgery that is rises as a reaction of procedure and hgb is cont. To go up.  Dr. Ma Hillock rec: to cut down prednisone to 5 mg.  She has 20 mg tablets and she will try cutting it into fourths and will let us know if she can't do it.

## 2015-06-25 ENCOUNTER — Telehealth: Payer: Self-pay | Admitting: *Deleted

## 2015-06-25 NOTE — Telephone Encounter (Signed)
Wife called to say that they are leaving to go on vacation and will not be here for 8/10 lab visit and it will need to be r/s to next week and he can come as soon as Monday 8/15. Called wife back and r/s lab to 8/15. Wife said they went to [facility] last week and hgb 9.3 and plt 292 and he is still on 5 mg prednisone.

## 2015-06-30 ENCOUNTER — Inpatient Hospital Stay: Payer: PPO | Attending: Internal Medicine

## 2015-06-30 DIAGNOSIS — D696 Thrombocytopenia, unspecified: Secondary | ICD-10-CM | POA: Insufficient documentation

## 2015-06-30 DIAGNOSIS — D649 Anemia, unspecified: Secondary | ICD-10-CM | POA: Insufficient documentation

## 2015-06-30 NOTE — Patient Outreach (Signed)
[place] [facility]) Care Management  06/30/2015  [name] Oct 11, 1932 [id number]   Referral from HTA tier 4 list, assigned to Sherrin Daisy, [facility] for patient outreach.  [name] L. Hera Celaya, [place] Care Management Assistant

## 2015-07-05 ENCOUNTER — Inpatient Hospital Stay: Payer: PPO

## 2015-07-05 ENCOUNTER — Telehealth: Payer: Self-pay | Admitting: *Deleted

## 2015-07-05 DIAGNOSIS — D696 Thrombocytopenia, unspecified: Secondary | ICD-10-CM | POA: Diagnosis not present

## 2015-07-05 DIAGNOSIS — D649 Anemia, unspecified: Secondary | ICD-10-CM | POA: Diagnosis present

## 2015-07-05 DIAGNOSIS — D693 Immune thrombocytopenic purpura: Secondary | ICD-10-CM

## 2015-07-05 LAB — CBC WITH DIFFERENTIAL/PLATELET
BASOS PCT: 1 %
Basophils Absolute: 0 10*3/uL (ref 0–0.1)
EOS PCT: 1 %
Eosinophils Absolute: 0 10*3/uL (ref 0–0.7)
HCT: 31.6 % — ABNORMAL LOW (ref 40.0–52.0)
Hemoglobin: 10.4 g/dL — ABNORMAL LOW (ref 13.0–18.0)
LYMPHS ABS: 1.1 10*3/uL (ref 1.0–3.6)
Lymphocytes Relative: 13 %
MCH: 31.5 pg (ref 26.0–34.0)
MCHC: 33 g/dL (ref 32.0–36.0)
MCV: 95.2 fL (ref 80.0–100.0)
MONO ABS: 0.6 10*3/uL (ref 0.2–1.0)
MONOS PCT: 7 %
Neutro Abs: 6.4 10*3/uL (ref 1.4–6.5)
Neutrophils Relative %: 78 %
Platelets: 258 10*3/uL (ref 150–440)
RBC: 3.31 MIL/uL — ABNORMAL LOW (ref 4.40–5.90)
RDW: 14.3 % (ref 11.5–14.5)
WBC: 8.1 10*3/uL (ref 3.8–10.6)

## 2015-07-05 NOTE — Telephone Encounter (Signed)
Wife called to get results of hgb and plt. hgb is 10.4, plt 276. Per pandit the pt should cut down to 2.5 mg of prednisone daily.  Spoke to wife and she can't cut the 20mg  down that far. She would like prednisone 5 mg and she will cut in half, called in 5 mg tablets and take 1/2 pill a day and #15.  Wife will go pick it up. rx called [facility].

## 2015-07-13 ENCOUNTER — Other Ambulatory Visit: Payer: Self-pay | Admitting: Internal Medicine

## 2015-07-13 DIAGNOSIS — D693 Immune thrombocytopenic purpura: Secondary | ICD-10-CM

## 2015-07-13 MED ORDER — ZOLPIDEM TARTRATE 5 MG PO TABS: ORAL_TABLET | ORAL | Status: DC

## 2015-07-21 ENCOUNTER — Other Ambulatory Visit: Payer: Self-pay | Admitting: *Deleted

## 2015-07-21 ENCOUNTER — Inpatient Hospital Stay: Payer: PPO

## 2015-07-21 DIAGNOSIS — D693 Immune thrombocytopenic purpura: Secondary | ICD-10-CM

## 2015-07-21 DIAGNOSIS — D649 Anemia, unspecified: Secondary | ICD-10-CM | POA: Diagnosis not present

## 2015-07-21 LAB — CBC WITH DIFFERENTIAL/PLATELET
Basophils Absolute: 0 10*3/uL (ref 0–0.1)
Basophils Relative: 1 %
EOS ABS: 0.1 10*3/uL (ref 0–0.7)
Eosinophils Relative: 1 %
HCT: 32.7 % — ABNORMAL LOW (ref 40.0–52.0)
HEMOGLOBIN: 10.7 g/dL — AB (ref 13.0–18.0)
LYMPHS ABS: 1.2 10*3/uL (ref 1.0–3.6)
Lymphocytes Relative: 20 %
MCH: 30.2 pg (ref 26.0–34.0)
MCHC: 32.8 g/dL (ref 32.0–36.0)
MCV: 91.9 fL (ref 80.0–100.0)
MONO ABS: 0.6 10*3/uL (ref 0.2–1.0)
MONOS PCT: 10 %
Neutro Abs: 4.3 10*3/uL (ref 1.4–6.5)
Neutrophils Relative %: 68 %
Platelets: 177 10*3/uL (ref 150–440)
RBC: 3.56 MIL/uL — ABNORMAL LOW (ref 4.40–5.90)
RDW: 15.4 % — AB (ref 11.5–14.5)
WBC: 6.2 10*3/uL (ref 3.8–10.6)

## 2015-07-21 NOTE — Patient Outreach (Signed)
[name] [facility]) Care Management  07/21/2015  [name]. June 23, 1932 [id number]  High Risk referral:  Telephone call to patient; not at home, left message with spouse to have patient return call.  Plan: will follow up. Sherrin Daisy, RN BSN [place] Management Coordinator [facility] Care Management  [phone]

## 2015-07-27 ENCOUNTER — Encounter: Payer: Self-pay | Admitting: *Deleted

## 2015-07-27 ENCOUNTER — Other Ambulatory Visit: Payer: Self-pay | Admitting: Internal Medicine

## 2015-07-27 ENCOUNTER — Other Ambulatory Visit: Payer: Self-pay | Admitting: *Deleted

## 2015-07-27 NOTE — Patient Outreach (Signed)
[place] [facility]) Care Management  07/27/2015  Caymen Dubray 18-Oct-1932 [id number]   Notification received from Sherrin Daisy, [facility] to close case due to patient refusing services.  Marcea Rojek L. Crosby Oriordan, [place] Care Management Assistant

## 2015-07-27 NOTE — Patient Outreach (Signed)
[place] [facility]) Care Management  07/27/2015  [name] [birth year] [id number]  [employer] tier 4 referral:   Telephone call to patient who was advised of reason for call and of [facility] care management services.  HIPPA verification received.  Patient voices he has no health care concerns that require case management at this time. States he has no difficulty seeing primary care providers or other required specialists for his healthcare. States getting medications from local pharmacy without problems. No transportation issues. Has support from spouse as needed. Telephone call  to patient who gave HIPPA verification. Patient was advised of reason for call and of [facility] care management services.    Patient has declined [facility] care management services. Contact information given to patient if needed.   Plan: Close case and send closure letter to MD.  Sherrin Daisy, [place] Management Coordinator [facility] Care Management  [phone]

## 2015-08-06 ENCOUNTER — Other Ambulatory Visit: Payer: Self-pay | Admitting: *Deleted

## 2015-08-06 MED ORDER — PREDNISONE 5 MG PO TABS: ORAL_TABLET | ORAL | Status: DC

## 2015-08-06 NOTE — Telephone Encounter (Signed)
Dose requested is for 5 mg tab 1/2 tablet (2.5 mg) daily #15. This is different than his med list of 20 mg tab 1/2 (10 mg) daily

## 2015-08-06 NOTE — Telephone Encounter (Signed)
Pt has been on 5 mg  Take 1/2 pill daily and cancelled the prednisone.  Wife had been using pill spliter to break them into to equal 2.5 mg. She ran out of the 20 and 5 mg was called in.

## 2015-08-10 DIAGNOSIS — G453 Amaurosis fugax: Secondary | ICD-10-CM | POA: Insufficient documentation

## 2015-08-10 HISTORY — DX: Amaurosis fugax: G45.3

## 2015-08-11 ENCOUNTER — Other Ambulatory Visit: Payer: Self-pay | Admitting: *Deleted

## 2015-08-11 ENCOUNTER — Inpatient Hospital Stay: Payer: PPO | Attending: Internal Medicine

## 2015-08-11 DIAGNOSIS — D696 Thrombocytopenia, unspecified: Secondary | ICD-10-CM

## 2015-08-11 DIAGNOSIS — D649 Anemia, unspecified: Secondary | ICD-10-CM | POA: Diagnosis present

## 2015-08-11 LAB — CBC WITH DIFFERENTIAL/PLATELET
BASOS ABS: 0 10*3/uL (ref 0–0.1)
BASOS PCT: 1 %
EOS ABS: 0.1 10*3/uL (ref 0–0.7)
Eosinophils Relative: 1 %
HCT: 32 % — ABNORMAL LOW (ref 40.0–52.0)
HEMOGLOBIN: 10.4 g/dL — AB (ref 13.0–18.0)
Lymphocytes Relative: 13 %
Lymphs Abs: 1 10*3/uL (ref 1.0–3.6)
MCH: 28.9 pg (ref 26.0–34.0)
MCHC: 32.5 g/dL (ref 32.0–36.0)
MCV: 89.1 fL (ref 80.0–100.0)
Monocytes Absolute: 0.5 10*3/uL (ref 0.2–1.0)
Monocytes Relative: 8 %
NEUTROS PCT: 77 %
Neutro Abs: 5.7 10*3/uL (ref 1.4–6.5)
Platelets: 198 10*3/uL (ref 150–440)
RBC: 3.59 MIL/uL — AB (ref 4.40–5.90)
RDW: 15.4 % — ABNORMAL HIGH (ref 11.5–14.5)
WBC: 7.3 10*3/uL (ref 3.8–10.6)

## 2015-08-12 ENCOUNTER — Telehealth: Payer: Self-pay | Admitting: *Deleted

## 2015-08-12 NOTE — Telephone Encounter (Signed)
Pt's wife had called to see what labs were today. She states that he went to eye doctor yest. And his b/p was up and they questioned if he was on steroids.  She was concerned if he should take them depending on counts.  i spoke to pandit and his counts are good and he can stop 1/2 5 mg daily and see what labs are like next blood draw.  I called wife and got voicemail and left message with plt 198 better than last time and still in normal limit. hgb 10.4 and last time 10.7 and encourage to cont. Eating iron rich foods.  Dr. Ma Hillock suggested to come off the prednisone and see what labs are like next time. Asked her to call if any questions

## 2015-08-24 ENCOUNTER — Emergency Department
Admission: EM | Admit: 2015-08-24 | Discharge: 2015-08-24 | Disposition: A | Discharge status: Home / Self Care | Payer: PPO | Attending: Emergency Medicine | Admitting: Emergency Medicine

## 2015-08-24 DIAGNOSIS — R42 Dizziness and giddiness: Secondary | ICD-10-CM | POA: Diagnosis not present

## 2015-08-24 DIAGNOSIS — Z79899 Other long term (current) drug therapy: Secondary | ICD-10-CM | POA: Diagnosis not present

## 2015-08-24 DIAGNOSIS — R040 Epistaxis: Secondary | ICD-10-CM | POA: Insufficient documentation

## 2015-08-24 DIAGNOSIS — I10 Essential (primary) hypertension: Secondary | ICD-10-CM | POA: Diagnosis not present

## 2015-08-24 LAB — BASIC METABOLIC PANEL
Anion gap: 6 (ref 5–15)
BUN: 18 mg/dL (ref 6–20)
CHLORIDE: 105 mmol/L (ref 101–111)
CO2: 23 mmol/L (ref 22–32)
Calcium: 9 mg/dL (ref 8.9–10.3)
Creatinine, Ser: 0.83 mg/dL (ref 0.61–1.24)
Glucose, Bld: 108 mg/dL — ABNORMAL HIGH (ref 65–99)
POTASSIUM: 3.6 mmol/L (ref 3.5–5.1)
SODIUM: 134 mmol/L — AB (ref 135–145)

## 2015-08-24 LAB — URINALYSIS COMPLETE WITH MICROSCOPIC (ARMC ONLY)
BACTERIA UA: NONE SEEN
Bilirubin Urine: NEGATIVE
Glucose, UA: NEGATIVE mg/dL
HGB URINE DIPSTICK: NEGATIVE
KETONES UR: NEGATIVE mg/dL
LEUKOCYTES UA: NEGATIVE
NITRITE: NEGATIVE
PH: 8 (ref 5.0–8.0)
PROTEIN: NEGATIVE mg/dL
SQUAMOUS EPITHELIAL / LPF: NONE SEEN
Specific Gravity, Urine: 1.004 — ABNORMAL LOW (ref 1.005–1.030)
WBC, UA: NONE SEEN WBC/hpf (ref 0–5)

## 2015-08-24 LAB — CBC
HEMATOCRIT: 33.6 % — AB (ref 40.0–52.0)
Hemoglobin: 11 g/dL — ABNORMAL LOW (ref 13.0–18.0)
MCH: 27.9 pg (ref 26.0–34.0)
MCHC: 32.7 g/dL (ref 32.0–36.0)
MCV: 85.5 fL (ref 80.0–100.0)
PLATELETS: 207 10*3/uL (ref 150–440)
RBC: 3.93 MIL/uL — AB (ref 4.40–5.90)
RDW: 16 % — ABNORMAL HIGH (ref 11.5–14.5)
WBC: 6.1 10*3/uL (ref 3.8–10.6)

## 2015-08-24 NOTE — ED Provider Notes (Signed)
[facility] Emergency Department Provider Note  ____________________________________________  Time seen: Approximately 10:25 AM  I have reviewed the triage vital signs and the nursing notes.   HISTORY  Chief Complaint Epistaxis and Dizziness    HPI [name]. is a 81 y.o. male with a history of ITP who is presenting today with a nosebleed at the cardiologist's office. The patient had right-sided bleeding after blowing his nose. He says that he has had some clear rhinorrhea over the past several days. He says the last time this happened as well as 42. He also recently stopped a prolonged steroid course about 1 week ago. He has had multiple platelet transfusions in the past. He said that he had coughed up several clots at the cardiologist office but the bleeding has since stopped spontaneously. He also had a brief period of lightheadedness which is now resolved. Denies any chest pain or shortness of breath.   Past Medical History  Diagnosis Date  . ITP (idiopathic thrombocytopenic purpura)   . Shingles   . Dehydration   . Hypertension   . Prostate cancer ([place])   . Hypercholesteremia     Patient Active Problem List   Diagnosis Date Noted  . Acute renal failure ([place]) 04/06/2015    Past Surgical History  Procedure Laterality Date  . Hernia repair    . Eye surgery    . Artificial left eye    . Joint replacement      Current Outpatient Rx  Name  Route  Sig  Dispense  Refill  . acetaminophen (TYLENOL) 500 MG tablet   Oral   Take 500 mg by mouth every 6 (six) hours as needed for mild pain.         . citalopram (CELEXA) 10 MG tablet   Oral   Take 10 mg by mouth daily.         Marland Kitchen dutasteride (AVODART) 0.5 MG capsule   Oral   Take 0.5 mg by mouth daily.         Marland Kitchen gabapentin (NEURONTIN) 100 MG capsule   Oral   Take 300 mg by mouth at bedtime.          Marland Kitchen lisinopril (PRINIVIL,ZESTRIL) 20 MG tablet   Oral   Take 20 mg by mouth  daily.         Marland Kitchen omeprazole (PRILOSEC) 40 MG capsule      TAKE ONE CAPSULE DAILY USUALLY THIRTY MINUTES BEFORE BREAKFAST   30 capsule   2   . polyethylene glycol (MIRALAX / GLYCOLAX) packet   Oral   Take 17 g by mouth daily as needed for mild constipation. Patient taking differently: Take 17 g by mouth daily.    14 each   0   . pravastatin (PRAVACHOL) 20 MG tablet   Oral   Take 20 mg by mouth at bedtime.          Marland Kitchen zolpidem (AMBIEN) 5 MG tablet      TAKE ONE TABLET AT BEDTIME AS NEEDED FOR SLEEPLESSNESS   30 tablet   1     FAXED REQUEST TO FOLLOW CONTROLLED SUBSTANCE   . ciprofloxacin (CIPRO) 500 MG tablet   Oral   Take 1 tablet (500 mg total) by mouth 2 (two) times daily. Patient not taking: Reported on 08/24/2015   6 tablet   0   . docusate sodium (COLACE) 100 MG capsule   Oral   Take 1 capsule (100 mg total) by mouth  2 (two) times daily. Patient not taking: Reported on 08/24/2015   60 capsule   2   . feeding supplement, ENSURE ENLIVE, (ENSURE ENLIVE) LIQD   Oral   Take 237 mLs by mouth 2 (two) times daily between meals. Patient not taking: Reported on 08/24/2015   237 mL   12   . metoCLOPramide (REGLAN) 10 MG tablet   Oral   Take 1 tablet (10 mg total) by mouth 3 (three) times daily with meals. Patient not taking: Reported on 08/24/2015   30 tablet   1   . predniSONE (DELTASONE) 5 MG tablet      Take 1/2 tablet daily Patient not taking: Reported on 08/24/2015   15 tablet   1   . tamsulosin (FLOMAX) 0.4 MG CAPS capsule   Oral   Take 2 capsules (0.8 mg total) by mouth daily. Patient not taking: Reported on 08/24/2015   60 capsule   0     Allergies Review of patient's allergies indicates no known allergies.  Family History  Problem Relation Age of Onset  . Cancer Mother   . Multiple sclerosis Father     Social History Social History  Substance Use Topics  . Smoking status: Never Smoker   . Smokeless tobacco: None  . Alcohol Use: No     Review of Systems Constitutional: No fever/chills Eyes: No visual changes. ENT: No sore throat. Cardiovascular: Denies chest pain. Respiratory: Denies shortness of breath. Gastrointestinal: No abdominal pain.  No nausea, no vomiting.  No diarrhea.  No constipation. Genitourinary: Negative for dysuria. Musculoskeletal: Negative for back pain. Skin: Negative for rash. Neurological: Negative for headaches, focal weakness or numbness.  10-point ROS otherwise negative.  ____________________________________________   PHYSICAL EXAM:  VITAL SIGNS: ED Triage Vitals  Enc Vitals Group     BP 08/24/15 0913 157/83 mmHg     Pulse Rate 08/24/15 0913 81     Resp 08/24/15 0913 18     Temp 08/24/15 0913 97.7 F (36.5 C)     Temp Source 08/24/15 0913 Oral     SpO2 08/24/15 0913 99 %     Weight 08/24/15 0913 164 lb (74.39 kg)     Height 08/24/15 0913 5\' 9"  (1.753 m)     Head Cir --      Peak Flow --      Pain Score --      Pain Loc --      Pain Edu? --      Excl. in [place]? --     Constitutional: Alert and oriented. Well appearing and in no acute distress. Eyes: Conjunctivae are normal. PERRL. EOMI. Head: Atraumatic. Nose: Bleeding site visualized to the right anterior septum. No active bleeding at this time. Small area of scabbed blood. Mouth/Throat: Mucous membranes are moist.  Oropharynx non-erythematous. No blood in the pharynx. Neck: No stridor.   Cardiovascular: Normal rate, regular rhythm. Grossly normal heart sounds.  Good peripheral circulation. Respiratory: Normal respiratory effort.  No retractions. Lungs CTAB. Gastrointestinal: Soft and nontender. No distention. No abdominal bruits. No CVA tenderness. Musculoskeletal: No lower extremity tenderness nor edema.  No joint effusions. Neurologic:  Normal speech and language. No gross focal neurologic deficits are appreciated. No gait instability. Skin:  Skin is warm, dry and intact. No rash noted. Psychiatric: Mood and affect  are normal. Speech and behavior are normal.  ____________________________________________   LABS (all labs ordered are listed, but only abnormal results are displayed)  Labs Reviewed  BASIC METABOLIC PANEL -  Abnormal; Notable for the following:    Sodium 134 (*)    Glucose, Bld 108 (*)    All other components within normal limits  CBC - Abnormal; Notable for the following:    RBC 3.93 (*)    Hemoglobin 11.0 (*)    HCT 33.6 (*)    RDW 16.0 (*)    All other components within normal limits  URINALYSIS COMPLETEWITH MICROSCOPIC ([facility] ONLY) - Abnormal; Notable for the following:    Color, Urine STRAW (*)    APPearance CLEAR (*)    Specific Gravity, Urine 1.004 (*)    All other components within normal limits   ____________________________________________  EKG  ED ECG REPORT I, Doran Stabler, the attending physician, personally viewed and interpreted this ECG.   Date: 08/24/2015  EKG Time: 922  Rate: 75  Rhythm: normal EKG, normal sinus rhythm  Axis: Normal axis  Intervals:none  ST&T Change: No ST segment elevation or depression. No abnormal T-wave inversion.  ____________________________________________  RADIOLOGY   ____________________________________________   PROCEDURES  ____________________________________________   INITIAL IMPRESSION / ASSESSMENT AND PLAN / ED COURSE  Pertinent labs & imaging results that were available during my care of the patient were reviewed by me and considered in my medical decision making (see chart for details).  ----------------------------------------- 11:54 AM on 08/24/2015 -----------------------------------------  Patient continues to rest comfortably without any further bleeding from his naris. I discussed the lab results with the patient as well as his wife. He has normal platelets today. Likely nosebleed from irritation from recent rhinorrhea. Counseled to use Vaseline to the bilateral septum of the nose noted to  ensure that it stays moist and does not rebleed. Unclear cause of the lightheadedness however the cardiac workup is reassuring and this may be related to the nosebleed itself. Not see any need for [place] at this time. We'll discharge to home. Discussed this with the patient as well as his wife who understand the plan and are willing to comply. ____________________________________________   FINAL CLINICAL IMPRESSION(S) / ED DIAGNOSES  Acute anterior epistaxis. Acute lightheadedness. Initial visit.    Orbie Pyo, MD 08/24/15 [phone]

## 2015-08-24 NOTE — ED Notes (Addendum)
Pt states he had bleeding from his right nare this morning that stopped and then while with his wife at her MD apt it began bleeding again and was concerned. No bleeding at present.the patient is not on any blood thinners. Pt states he felt "faint" and is continuing to have lightheadedness at present.Marland Kitchen

## 2015-08-24 NOTE — Discharge Instructions (Signed)
Nosebleed °A nosebleed can be caused by many things, including: °· Getting hit hard in the nose. °· Infections. °· Dry nose. °· Colds. °· Medicines. °Your doctor may do lab testing if you get nosebleeds a lot and the cause is not known. °HOME CARE  °· If your nose was packed with material, keep it there until your doctor takes it out. Put the pack back in your nose if the pack falls out. °· Do not blow your nose for 12 hours after the nosebleed. °· Sit up and bend forward if your nose starts bleeding again. Pinch the front half of your nose nonstop for 20 minutes. °· Put petroleum jelly inside your nose every morning if you have a dry nose. °· Use a humidifier to make the air less dry. °· Do not take aspirin. °· Try not to strain, lift, or bend at the waist for many days after the nosebleed. °GET HELP RIGHT AWAY IF:  °· Nosebleeds keep happening and are hard to stop or control. °· You have bleeding or bruises that are not normal on other parts of the body. °· You have a fever. °· The nosebleeds get worse. °· You get lightheaded, feel faint, sweaty, or throw up (vomit) blood. °MAKE SURE YOU:  °· Understand these instructions. °· Will watch your condition. °· Will get help right away if you are not doing well or get worse. °Document Released: 08/15/2008 Document Revised: 01/29/2012 Document Reviewed: 08/15/2008 °ExitCare® Patient Information ©2015 [employer]. This information is not intended to replace advice given to you by your health care provider. Make sure you discuss any questions you have with your health care provider. ° °

## 2015-09-01 ENCOUNTER — Inpatient Hospital Stay (HOSPITAL_BASED_OUTPATIENT_CLINIC_OR_DEPARTMENT_OTHER): Payer: PPO | Admitting: Family Medicine

## 2015-09-01 ENCOUNTER — Inpatient Hospital Stay: Payer: PPO | Attending: Family Medicine

## 2015-09-01 ENCOUNTER — Other Ambulatory Visit: Payer: Self-pay | Admitting: *Deleted

## 2015-09-01 ENCOUNTER — Ambulatory Visit: Payer: Self-pay | Admitting: Internal Medicine

## 2015-09-01 ENCOUNTER — Encounter: Payer: Self-pay | Admitting: Family Medicine

## 2015-09-01 VITALS — BP 127/69 | HR 70 | Temp 97.9°F | Wt 164.0 lb

## 2015-09-01 DIAGNOSIS — C801 Malignant (primary) neoplasm, unspecified: Secondary | ICD-10-CM

## 2015-09-01 DIAGNOSIS — Z79899 Other long term (current) drug therapy: Secondary | ICD-10-CM | POA: Insufficient documentation

## 2015-09-01 DIAGNOSIS — D72829 Elevated white blood cell count, unspecified: Secondary | ICD-10-CM | POA: Insufficient documentation

## 2015-09-01 DIAGNOSIS — D591 Other autoimmune hemolytic anemias: Secondary | ICD-10-CM | POA: Insufficient documentation

## 2015-09-01 DIAGNOSIS — Z23 Encounter for immunization: Secondary | ICD-10-CM | POA: Diagnosis not present

## 2015-09-01 DIAGNOSIS — D693 Immune thrombocytopenic purpura: Secondary | ICD-10-CM | POA: Diagnosis present

## 2015-09-01 DIAGNOSIS — G4700 Insomnia, unspecified: Secondary | ICD-10-CM

## 2015-09-01 DIAGNOSIS — I10 Essential (primary) hypertension: Secondary | ICD-10-CM | POA: Insufficient documentation

## 2015-09-01 DIAGNOSIS — E7800 Pure hypercholesterolemia, unspecified: Secondary | ICD-10-CM | POA: Diagnosis not present

## 2015-09-01 DIAGNOSIS — Z8546 Personal history of malignant neoplasm of prostate: Secondary | ICD-10-CM | POA: Diagnosis not present

## 2015-09-01 HISTORY — DX: Immune thrombocytopenic purpura: D69.3

## 2015-09-01 LAB — COMPREHENSIVE METABOLIC PANEL
ALK PHOS: 65 U/L (ref 38–126)
ALT: 21 U/L (ref 17–63)
ANION GAP: 4 — AB (ref 5–15)
AST: 28 U/L (ref 15–41)
Albumin: 3.9 g/dL (ref 3.5–5.0)
BILIRUBIN TOTAL: 0.5 mg/dL (ref 0.3–1.2)
BUN: 18 mg/dL (ref 6–20)
CO2: 25 mmol/L (ref 22–32)
Calcium: 8.2 mg/dL — ABNORMAL LOW (ref 8.9–10.3)
Chloride: 102 mmol/L (ref 101–111)
Creatinine, Ser: 0.88 mg/dL (ref 0.61–1.24)
Glucose, Bld: 114 mg/dL — ABNORMAL HIGH (ref 65–99)
Potassium: 4.1 mmol/L (ref 3.5–5.1)
SODIUM: 131 mmol/L — AB (ref 135–145)
TOTAL PROTEIN: 6.9 g/dL (ref 6.5–8.1)

## 2015-09-01 LAB — CBC WITH DIFFERENTIAL/PLATELET
Basophils Absolute: 0 10*3/uL (ref 0–0.1)
Basophils Relative: 0 %
EOS ABS: 0.1 10*3/uL (ref 0–0.7)
Eosinophils Relative: 1 %
HEMATOCRIT: 33.7 % — AB (ref 40.0–52.0)
HEMOGLOBIN: 11.1 g/dL — AB (ref 13.0–18.0)
LYMPHS ABS: 1.3 10*3/uL (ref 1.0–3.6)
Lymphocytes Relative: 20 %
MCH: 28.1 pg (ref 26.0–34.0)
MCHC: 33 g/dL (ref 32.0–36.0)
MCV: 85.3 fL (ref 80.0–100.0)
MONOS PCT: 9 %
Monocytes Absolute: 0.6 10*3/uL (ref 0.2–1.0)
NEUTROS PCT: 70 %
Neutro Abs: 4.5 10*3/uL (ref 1.4–6.5)
Platelets: 243 10*3/uL (ref 150–440)
RBC: 3.95 MIL/uL — ABNORMAL LOW (ref 4.40–5.90)
RDW: 16.8 % — ABNORMAL HIGH (ref 11.5–14.5)
WBC: 6.4 10*3/uL (ref 3.8–10.6)

## 2015-09-01 MED ORDER — INFLUENZA VAC SPLIT QUAD 0.5 ML IM SUSY
0.5000 mL | PREFILLED_SYRINGE | Freq: Once | INTRAMUSCULAR | Status: AC
  Administered 2015-09-01: 0.5 mL via INTRAMUSCULAR
  Filled 2015-09-01: qty 0.5

## 2015-09-01 NOTE — Progress Notes (Signed)
Patient states troubel sleeping x 2 weeks since stopped taking prednisone. Takes ambien 5 mg q night but states it is not helping

## 2015-09-06 NOTE — Progress Notes (Signed)
[name]  [phone]  Fax:([phone]) [place] DOB: [birth year]  MR#: [id number]  VZD#:[id number]  Patient Care Team: Derinda Late, MD as PCP - General (Family Medicine)  CHIEF COMPLAINT:  Chief Complaint  Patient presents with  . Follow-up  ITP (immune thrombocytopenic purpura) along with [place] direct positive hemolytic anemia in February 2016 - Labs on 01/10/15 showed platelet count of 2000, also hemoglobin had dropped to about 8.5. Direct Coombs test positive, LDH elevated, reticulocyte count high, direct platelet antibody test positive.  CT scan of the chest negative for lymphadenopathy or lung nodules, showed thyroid nodule but ultrasound of the thyroid did not visualize this nodule. Ultrasound of the abdomen revealed no hepatosplenomegaly or did not report any obvious lymphadenopathy.  Patient received IVIG, platelet transfusion, and steroid therapy during hospitalization February 2016 with good initial response.  INTERVAL HISTORY:  Patient is here for further follow-up regarding ITP. He is currently completely tapered off of prednisone. Her most recent notes by Lady Saucier, RN patient completely finished taper around into September. He denies any acute complaints today other than some intermittent insomnia. He has Ambien 5 mg to take but reports he wakes in the middle of the night and is unable to go back to sleep. He denies any pain, easy bruising, or any other active bleeding.  REVIEW OF SYSTEMS:   Review of Systems  Constitutional: Negative for fever, chills, weight loss, malaise/fatigue and diaphoresis.  HENT: Negative for congestion, ear discharge, ear pain, hearing loss, nosebleeds, sore throat and tinnitus.   Eyes: Negative for blurred vision, double vision, photophobia, pain, discharge and redness.  Respiratory: Negative for cough, hemoptysis, sputum production, shortness of breath, wheezing and stridor.   Cardiovascular:  Negative for chest pain, palpitations, orthopnea, claudication, leg swelling and PND.  Gastrointestinal: Negative for heartburn, nausea, vomiting, abdominal pain, diarrhea, constipation, blood in stool and melena.  Genitourinary: Negative.   Musculoskeletal: Negative.   Skin: Negative.   Neurological: Negative for dizziness, tingling, focal weakness, seizures, weakness and headaches.  Endo/Heme/Allergies: Does not bruise/bleed easily.  Psychiatric/Behavioral: Negative for depression. The patient has insomnia. The patient is not nervous/anxious.     As per HPI. Otherwise, a complete review of systems is negatve.  PAST MEDICAL HISTORY: Past Medical History  Diagnosis Date  . ITP (idiopathic thrombocytopenic purpura)   . Shingles   . Dehydration   . Hypertension   . Prostate cancer ([place])   . Hypercholesteremia   . ITP (idiopathic thrombocytopenic purpura) 09/01/2015    PAST SURGICAL HISTORY: Past Surgical History  Procedure Laterality Date  . Hernia repair    . Eye surgery    . Artificial left eye    . Joint replacement      FAMILY HISTORY Family History  Problem Relation Age of Onset  . Cancer Mother   . Multiple sclerosis Father     GYNECOLOGIC HISTORY:  No LMP for male patient.     ADVANCED DIRECTIVES:    HEALTH MAINTENANCE: Social History  Substance Use Topics  . Smoking status: Never Smoker   . Smokeless tobacco: Not on file  . Alcohol Use: No     Colonoscopy:  PAP:  Bone density:  Lipid panel:  No Known Allergies  Current Outpatient Prescriptions  Medication Sig Dispense Refill  . acetaminophen (TYLENOL) 500 MG tablet Take 500 mg by mouth every 6 (six) hours as needed for mild pain.    . citalopram (CELEXA) 10 MG tablet Take  10 mg by mouth daily.    Marland Kitchen docusate sodium (COLACE) 100 MG capsule Take 1 capsule (100 mg total) by mouth 2 (two) times daily. 60 capsule 2  . feeding supplement, ENSURE ENLIVE, (ENSURE ENLIVE) LIQD Take 237 mLs by mouth 2  (two) times daily between meals. 237 mL 12  . gabapentin (NEURONTIN) 100 MG capsule Take 300 mg by mouth at bedtime.     Marland Kitchen latanoprost (XALATAN) 0.005 % ophthalmic solution Apply 1 drop to eye. At night    . lisinopril (PRINIVIL,ZESTRIL) 20 MG tablet Take 20 mg by mouth daily.    Marland Kitchen omeprazole (PRILOSEC) 40 MG capsule TAKE ONE CAPSULE DAILY USUALLY THIRTY MINUTES BEFORE BREAKFAST 30 capsule 2  . polyethylene glycol (MIRALAX / GLYCOLAX) packet Take 17 g by mouth daily as needed for mild constipation. 14 each 0  . pravastatin (PRAVACHOL) 20 MG tablet Take 20 mg by mouth at bedtime.     Marland Kitchen zolpidem (AMBIEN) 5 MG tablet TAKE ONE TABLET AT BEDTIME AS NEEDED FOR SLEEPLESSNESS 30 tablet 1  . ciprofloxacin (CIPRO) 500 MG tablet Take 1 tablet (500 mg total) by mouth 2 (two) times daily. (Patient not taking: Reported on 08/24/2015) 6 tablet 0  . Cyanocobalamin (RA VITAMIN B-12 TR) 1000 MCG TBCR Take 1,000 mcg by mouth daily.    Marland Kitchen dutasteride (AVODART) 0.5 MG capsule Take 0.5 mg by mouth daily.    . metoCLOPramide (REGLAN) 10 MG tablet Take 1 tablet (10 mg total) by mouth 3 (three) times daily with meals. (Patient not taking: Reported on 08/24/2015) 30 tablet 1  . predniSONE (DELTASONE) 5 MG tablet Take 1/2 tablet daily (Patient not taking: Reported on 08/24/2015) 15 tablet 1  . tamsulosin (FLOMAX) 0.4 MG CAPS capsule Take 2 capsules (0.8 mg total) by mouth daily. (Patient not taking: Reported on 08/24/2015) 60 capsule 0   No current facility-administered medications for this visit.    OBJECTIVE: BP 127/69 mmHg  Pulse 70  Temp(Src) 97.9 F (36.6 C) (Oral)  Wt 164 lb 0.4 oz (74.4 kg)   Body mass index is 24.21 kg/(m^2).    ECOG FS:0 - Asymptomatic  General: Well-developed, well-nourished, no acute distress. Eyes: Pink conjunctiva, anicteric sclera. HEENT: Normocephalic, moist mucous membranes, clear oropharnyx. Lungs: Clear to auscultation bilaterally. Heart: Regular rate and rhythm. No rubs, murmurs, or  gallops. Abdomen: Soft, nontender, nondistended. No organomegaly noted, normoactive bowel sounds. Musculoskeletal: No edema, cyanosis, or clubbing. Neuro: Alert, answering all questions appropriately. Cranial nerves grossly intact. Skin: No rashes or petechiae noted. Psych: Normal affect.    LAB RESULTS:  Appointment on 09/01/2015  Component Date Value Ref Range Status  . WBC 09/01/2015 6.4  3.8 - 10.6 K/uL Final  . RBC 09/01/2015 3.95* 4.40 - 5.90 MIL/uL Final  . Hemoglobin 09/01/2015 11.1* 13.0 - 18.0 g/dL Final  . HCT 09/01/2015 33.7* 40.0 - 52.0 % Final  . MCV 09/01/2015 85.3  80.0 - 100.0 fL Final  . MCH 09/01/2015 28.1  26.0 - 34.0 pg Final  . MCHC 09/01/2015 33.0  32.0 - 36.0 g/dL Final  . RDW 09/01/2015 16.8* 11.5 - 14.5 % Final  . Platelets 09/01/2015 243  150 - 440 K/uL Final  . Neutrophils Relative % 09/01/2015 70   Final  . Neutro Abs 09/01/2015 4.5  1.4 - 6.5 K/uL Final  . Lymphocytes Relative 09/01/2015 20   Final  . Lymphs Abs 09/01/2015 1.3  1.0 - 3.6 K/uL Final  . Monocytes Relative 09/01/2015 9   Final  .  Monocytes Absolute 09/01/2015 0.6  0.2 - 1.0 K/uL Final  . Eosinophils Relative 09/01/2015 1   Final  . Eosinophils Absolute 09/01/2015 0.1  0 - 0.7 K/uL Final  . Basophils Relative 09/01/2015 0   Final  . Basophils Absolute 09/01/2015 0.0  0 - 0.1 K/uL Final  . Sodium 09/01/2015 131* 135 - 145 mmol/L Final  . Potassium 09/01/2015 4.1  3.5 - 5.1 mmol/L Final  . Chloride 09/01/2015 102  101 - 111 mmol/L Final  . CO2 09/01/2015 25  22 - 32 mmol/L Final  . Glucose, Bld 09/01/2015 114* 65 - 99 mg/dL Final  . BUN 09/01/2015 18  6 - 20 mg/dL Final  . Creatinine, Ser 09/01/2015 0.88  0.61 - 1.24 mg/dL Final  . Calcium 09/01/2015 8.2* 8.9 - 10.3 mg/dL Final  . Total Protein 09/01/2015 6.9  6.5 - 8.1 g/dL Final  . Albumin 09/01/2015 3.9  3.5 - 5.0 g/dL Final  . AST 09/01/2015 28  15 - 41 U/L Final  . ALT 09/01/2015 21  17 - 63 U/L Final  . Alkaline Phosphatase  09/01/2015 65  38 - 126 U/L Final  . Total Bilirubin 09/01/2015 0.5  0.3 - 1.2 mg/dL Final  . GFR calc non Af Amer 09/01/2015 >60  >60 mL/min Final  . GFR calc Af Amer 09/01/2015 >60  >60 mL/min Final   Comment: (NOTE) The eGFR has been calculated using the CKD EPI equation. This calculation has not been validated in all clinical situations. eGFR's persistently <60 mL/min signify possible Chronic Kidney Disease.   . Anion gap 09/01/2015 4* 5 - 15 Final    STUDIES: No results found.  ASSESSMENT:  ITP. Autoimmune hemolytic anemia. Leukocytosis. Insomnia.  PLAN:  1. ITP (immune thrombocytopenic purpura), direct platelet antibody test February 2016 is positive. Bone marrow biopsy was mostly unremarkable for any obvious myelodysplasia or other marrow disorder, there was minimal dyserythropoiesis noted with unremarkable flow cytometry and cytogenetics. Patient was last seen by Dr. Ma Hillock in April 2016. Patient has since tapered off of prednisone, last dose is approximately the end of September. His labs remained stable off of prednisone with a platelet count of 243. 2. Autoimmune hemolytic anemia. Recent bone marrow biopsy is negative for any lymphoma or other obvious marrow disorder. Hemoglobin today is 11.1 (was down to 8.2 on February 23).  3. Leukocytosis. WBC count 6.4 today. Likely secondary to prednisone therapy. Continue to monitor. 4. Insomnia. Advised patient to discuss with primary care provider his current dose of Ambien as well as possible use of extended release Ambien. He is agreeable to this.  As ITP appears stable as well as hemolytic anemia, we'll recheck labs every 4 weeks and we'll schedule next follow-up appointment with Dr. Rogue Bussing in 3 months.  Patient expressed understanding and was in agreement with this plan. He also understands that He can call clinic at any time with any questions, concerns, or complaints.   Dr. Oliva Bustard was available for consultation and review  of plan of care for this patient.  Evlyn Kanner, NP   09/01/2015 11:26 AM

## 2015-09-29 ENCOUNTER — Inpatient Hospital Stay: Payer: PPO | Attending: Internal Medicine

## 2015-09-29 ENCOUNTER — Telehealth: Payer: Self-pay | Admitting: *Deleted

## 2015-09-29 DIAGNOSIS — D693 Immune thrombocytopenic purpura: Secondary | ICD-10-CM | POA: Diagnosis present

## 2015-09-29 DIAGNOSIS — D72829 Elevated white blood cell count, unspecified: Secondary | ICD-10-CM | POA: Insufficient documentation

## 2015-09-29 DIAGNOSIS — D591 Other autoimmune hemolytic anemias: Secondary | ICD-10-CM | POA: Insufficient documentation

## 2015-09-29 LAB — CBC WITH DIFFERENTIAL/PLATELET
BASOS ABS: 0 10*3/uL (ref 0–0.1)
BASOS PCT: 1 %
Eosinophils Absolute: 0.1 10*3/uL (ref 0–0.7)
Eosinophils Relative: 2 %
HEMATOCRIT: 33 % — AB (ref 40.0–52.0)
HEMOGLOBIN: 11 g/dL — AB (ref 13.0–18.0)
LYMPHS PCT: 20 %
Lymphs Abs: 1.4 10*3/uL (ref 1.0–3.6)
MCH: 28.3 pg (ref 26.0–34.0)
MCHC: 33.4 g/dL (ref 32.0–36.0)
MCV: 84.8 fL (ref 80.0–100.0)
MONO ABS: 0.6 10*3/uL (ref 0.2–1.0)
MONOS PCT: 9 %
NEUTROS ABS: 4.9 10*3/uL (ref 1.4–6.5)
NEUTROS PCT: 68 %
Platelets: 44 10*3/uL — ABNORMAL LOW (ref 150–440)
RBC: 3.89 MIL/uL — ABNORMAL LOW (ref 4.40–5.90)
RDW: 18.6 % — AB (ref 11.5–14.5)
WBC: 7.1 10*3/uL (ref 3.8–10.6)

## 2015-09-29 MED ORDER — PREDNISONE 20 MG PO TABS: 20.0000 mg | ORAL_TABLET | Freq: Every day | ORAL | Status: DC

## 2015-09-29 NOTE — Telephone Encounter (Signed)
Pt given results of plt.  He has not changed any meds. He has nose bleed x 2 and it did not last long while his wife was in [place].. Last sat it lasted a hour. Informed pt to start prednisone and come back mon 10 am to check level.  Call if pt has nose bleeds.or any blood in urine or stool

## 2015-10-03 ENCOUNTER — Other Ambulatory Visit: Payer: Self-pay | Admitting: *Deleted

## 2015-10-03 DIAGNOSIS — D693 Immune thrombocytopenic purpura: Secondary | ICD-10-CM

## 2015-10-04 ENCOUNTER — Inpatient Hospital Stay: Payer: PPO

## 2015-10-04 ENCOUNTER — Telehealth: Payer: Self-pay | Admitting: *Deleted

## 2015-10-04 DIAGNOSIS — D693 Immune thrombocytopenic purpura: Secondary | ICD-10-CM

## 2015-10-04 LAB — CBC WITH DIFFERENTIAL/PLATELET
Basophils Absolute: 0 10*3/uL (ref 0–0.1)
Basophils Relative: 1 %
Eosinophils Absolute: 0.1 10*3/uL (ref 0–0.7)
Eosinophils Relative: 1 %
HEMATOCRIT: 33.3 % — AB (ref 40.0–52.0)
Hemoglobin: 11.2 g/dL — ABNORMAL LOW (ref 13.0–18.0)
LYMPHS ABS: 1.3 10*3/uL (ref 1.0–3.6)
LYMPHS PCT: 16 %
MCH: 28.6 pg (ref 26.0–34.0)
MCHC: 33.5 g/dL (ref 32.0–36.0)
MCV: 85.2 fL (ref 80.0–100.0)
MONO ABS: 0.6 10*3/uL (ref 0.2–1.0)
MONOS PCT: 8 %
NEUTROS ABS: 6.3 10*3/uL (ref 1.4–6.5)
Neutrophils Relative %: 74 %
Platelets: 103 10*3/uL — ABNORMAL LOW (ref 150–440)
RBC: 3.91 MIL/uL — ABNORMAL LOW (ref 4.40–5.90)
RDW: 18.8 % — AB (ref 11.5–14.5)
WBC: 8.4 10*3/uL (ref 3.8–10.6)

## 2015-10-04 NOTE — Telephone Encounter (Signed)
Called to patients house and wife answered and told her the counts 103. He can decrease to 10 mg of prednisone starting tom.  Make lab appt for 11/28 at 10 am.  Then lab and md on 12/9 2:00 and see md 2:30. Pt already knows.  I have sent in basket to sch, for the appt.

## 2015-10-16 ENCOUNTER — Encounter: Payer: Self-pay | Admitting: Emergency Medicine

## 2015-10-16 DIAGNOSIS — Z79899 Other long term (current) drug therapy: Secondary | ICD-10-CM | POA: Diagnosis not present

## 2015-10-16 DIAGNOSIS — I10 Essential (primary) hypertension: Secondary | ICD-10-CM | POA: Diagnosis not present

## 2015-10-16 DIAGNOSIS — D696 Thrombocytopenia, unspecified: Secondary | ICD-10-CM | POA: Diagnosis not present

## 2015-10-16 DIAGNOSIS — Z7952 Long term (current) use of systemic steroids: Secondary | ICD-10-CM | POA: Diagnosis not present

## 2015-10-16 DIAGNOSIS — R040 Epistaxis: Secondary | ICD-10-CM | POA: Diagnosis not present

## 2015-10-16 NOTE — ED Notes (Signed)
Pt presents today with epistaxis, pts wife details hx of low platelet counts last year.  They were told by MD to come to ER if nose bleed lasts longer than 15 minutes.  Pt denies dizziness or decreased LOC.

## 2015-10-17 ENCOUNTER — Emergency Department
Admission: EM | Admit: 2015-10-17 | Discharge: 2015-10-17 | Disposition: A | Discharge status: Home / Self Care | Payer: PPO | Attending: Emergency Medicine | Admitting: Emergency Medicine

## 2015-10-17 DIAGNOSIS — D696 Thrombocytopenia, unspecified: Secondary | ICD-10-CM

## 2015-10-17 DIAGNOSIS — R040 Epistaxis: Secondary | ICD-10-CM

## 2015-10-17 LAB — CBC
HCT: 32.3 % — ABNORMAL LOW (ref 40.0–52.0)
Hemoglobin: 10.9 g/dL — ABNORMAL LOW (ref 13.0–18.0)
MCH: 29.2 pg (ref 26.0–34.0)
MCHC: 33.7 g/dL (ref 32.0–36.0)
MCV: 86.7 fL (ref 80.0–100.0)
PLATELETS: 94 10*3/uL — AB (ref 150–440)
RBC: 3.72 MIL/uL — ABNORMAL LOW (ref 4.40–5.90)
RDW: 18.3 % — AB (ref 11.5–14.5)
WBC: 8.5 10*3/uL (ref 3.8–10.6)

## 2015-10-17 LAB — PROTIME-INR
INR: 1.02
Prothrombin Time: 13.6 seconds (ref 11.4–15.0)

## 2015-10-17 MED ORDER — OXYMETAZOLINE HCL 0.05 % NA SOLN
1.0000 | Freq: Once | NASAL | Status: AC
  Administered 2015-10-17: 1 via NASAL
  Filled 2015-10-17: qty 15

## 2015-10-17 NOTE — Discharge Instructions (Signed)
1. If your nose bleeds again, apply 1 spray of Afrin to affected nares, insert cotton ball and apply nasal clamp. Lower your head and apply ice to the bridge of your nose. If you are still bleeding after 20 minutes, please come to the emergency department for evaluation. 2. Return to the ER for worsening symptoms, persistent vomiting, difficulty breathing or other concerns.  Nosebleed Nosebleeds are common. They are due to a crack in the inside lining of your nose (mucous membrane) or from a small blood vessel that starts to bleed. Nosebleeds can be caused by many conditions, such as injury, infections, dry mucous membranes or dry climate, medicines, nose picking, and home heating and cooling systems. Most nosebleeds come from blood vessels in the front of your nose. HOME CARE INSTRUCTIONS   Try controlling your nosebleed by pinching your nostrils gently and continuously for at least 10 minutes.  Avoid blowing or sniffing your nose for a number of hours after having a nosebleed.  Do not put gauze inside your nose yourself. If your nose was packed by your health care provider, try to maintain the pack inside of your nose until your health care provider removes it.  If a gauze pack was used and it starts to fall out, gently replace it or cut off the end of it.  If a balloon catheter was used to pack your nose, do not cut or remove it unless your health care provider has instructed you to do that.  Avoid lying down while you are having a nosebleed. Sit up and lean forward.  Use a nasal spray decongestant to help with a nosebleed as directed by your health care provider.  Do not use petroleum jelly or mineral oil in your nose. These can drip into your lungs.  Maintain humidity in your home by using less air conditioning or by using a humidifier.  Aspirinand blood thinners make bleeding more likely. If you are prescribed these medicines and you suffer from nosebleeds, ask your health care  provider if you should stop taking the medicines or adjust the dose. Do not stop medicines unless directed by your health care provider  Resume your normal activities as you are able, but avoid straining, lifting, or bending at the waist for several days.  If your nosebleed was caused by dry mucous membranes, use over-the-counter saline nasal spray or gel. This will keep the mucous membranes moist and allow them to heal. If you must use a lubricant, choose the water-soluble variety. Use it only sparingly, and do not use it within several hours of lying down.  Keep all follow-up visits as directed by your health care provider. This is important. SEEK MEDICAL CARE IF:  You have a fever.  You get frequent nosebleeds.  You are getting nosebleeds more often. SEEK IMMEDIATE MEDICAL CARE IF:  Your nosebleed lasts longer than 20 minutes.  Your nosebleed occurs after an injury to your face, and your nose looks crooked or broken.  You have unusual bleeding from other parts of your body.  You have unusual bruising on other parts of your body.  You feel light-headed or you faint.  You become sweaty.  You vomit blood.  Your nosebleed occurs after a head injury.   This information is not intended to replace advice given to you by your health care provider. Make sure you discuss any questions you have with your health care provider.   Document Released: 08/16/2005 Document Revised: 11/27/2014 Document Reviewed: 06/22/2014 Elsevier Interactive Patient  Education 2016 [employer].  Thrombocytopenia Thrombocytopenia is a condition in which there is an abnormally small number of platelets in your blood. Platelets are also called thrombocytes. Platelets are needed for blood clotting. CAUSES Thrombocytopenia is caused by:   Decreased production of platelets. This can be caused by:  Aplastic anemia in which your bone marrow quits making blood cells.  Cancer in the bone marrow.  Use of  certain medicines, including chemotherapy.  Infection in the bone marrow.  Heavy alcohol consumption.  Increased destruction of platelets. This can be caused by:  Certain immune diseases.  Use of certain drugs.  Certain blood clotting disorders.  Certain inherited disorders.  Certain bleeding disorders.  Pregnancy.  Having an enlarged spleen (hypersplenism). In hypersplenism, the spleen gathers up platelets from circulation. This means the platelets are not available to help with blood clotting. The spleen can enlarge due to cirrhosis or other conditions. SYMPTOMS  The symptoms of thrombocytopenia are side effects of poor blood clotting. Some of these are:  Abnormal bleeding.  Nosebleeds.  Heavy menstrual periods.  Blood in the urine or stools.  Purpura. This is a purplish discoloration in the skin produced by small bleeding vessels near the surface of the skin.  Bruising.  A rash that may be petechial. This looks like pinpoint, purplish-red spots on the skin and mucous membranes. It is caused by bleeding from small blood vessels (capillaries). DIAGNOSIS  Your caregiver will make this diagnosis based on your exam and blood tests. Sometimes, a bone marrow study is done to look for the original cells (megakaryocytes) that make platelets. TREATMENT  Treatment depends on the cause of the condition.  Medicines may be given to help protect your platelets from being destroyed.  In some cases, a replacement (transfusion) of platelets may be required to stop or prevent bleeding.  Sometimes, the spleen must be surgically removed. HOME CARE INSTRUCTIONS   Check the skin and linings inside your mouth for bruising or bleeding as directed by your caregiver.  Check your sputum, urine, and stool for blood as directed by your caregiver.  Do not return to any activities that could cause bumps or bruises until your caregiver says it is okay.  Take extra care not to cut yourself  when shaving or when using scissors, needles, knives, and other tools.  Take extra care not to burn yourself when ironing or cooking.  Ask your caregiver if it is okay for you to drink alcohol.  Only take over-the-counter or prescription medicines as directed by your caregiver.  Notify all your caregivers, including dentists and eye doctors, about your condition. SEEK IMMEDIATE MEDICAL CARE IF:   You develop active bleeding from anywhere in your body.  You develop unexplained bruising or bleeding.  You have blood in your sputum, urine, or stool. MAKE SURE YOU:  Understand these instructions.  Will watch your condition.  Will get help right away if you are not doing well or get worse.   This information is not intended to replace advice given to you by your health care provider. Make sure you discuss any questions you have with your health care provider.   Document Released: 11/06/2005 Document Revised: 01/29/2012 Document Reviewed: 05/10/2015 Elsevier Interactive Patient Education [employer].

## 2015-10-17 NOTE — ED Notes (Signed)
Patient with no complaints at this time. Respirations even and unlabored. Skin warm/dry. Discharge instructions reviewed with patient at this time. Patient given opportunity to voice concerns/ask questions. Patient discharged at this time and left Emergency Department with steady gait.   

## 2015-10-17 NOTE — ED Provider Notes (Signed)
[facility] Emergency Department Provider Note  ____________________________________________  Time seen: Approximately 1:35 AM  I have reviewed the triage vital signs and the nursing notes.   HISTORY  Chief Complaint Epistaxis    HPI [name]. is a 81 y.o. male who presents to the ED from home with the chief complaint of epistaxis. 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 States bleeding from right nares greater than 20 minutes at approximately 8 PM. Wife wanted patient to be evaluated in the ED. They state the bleeding stopped upon arrival to the ED and has not re-bled since. Patient attributes bleeding to the dry weather. Denies associated symptoms of lightheadedness, dizziness, nausea, chest pain, shortness of breath, weakness.Nothing made his bleeding worse. Applying a nasal clamp made his bleeding better. They could not find the bottle of Afrin that they keep at home which usually controls his nosebleeds. Patient has had to have his nose packed previously several years ago.   Past Medical History  Diagnosis Date  . ITP (idiopathic thrombocytopenic purpura)   . Shingles   . Dehydration   . Hypertension   . Prostate cancer ([place])   . Hypercholesteremia   . ITP (idiopathic thrombocytopenic purpura) 09/01/2015    Patient Active Problem List   Diagnosis Date Noted  . 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 Acute renal failure ([place]) 04/06/2015    Past Surgical History  Procedure Laterality Date  . Hernia repair    . Eye surgery    . Artificial left eye    . Joint replacement      Current Outpatient Rx  Name  Route  Sig  Dispense  Refill  . acetaminophen (TYLENOL) 500 MG tablet   Oral   Take 500 mg by mouth every 6 (six) hours as needed for mild pain.         . ciprofloxacin (CIPRO) 500 MG tablet   Oral   Take 1 tablet (500 mg total) by mouth 2 (two) times daily. Patient  not taking: Reported on 08/24/2015   6 tablet   0   . citalopram (CELEXA) 10 MG tablet   Oral   Take 10 mg by mouth daily.         . Cyanocobalamin (RA VITAMIN B-12 TR) 1000 MCG TBCR   Oral   Take 1,000 mcg by mouth daily.         Marland Kitchen docusate sodium (COLACE) 100 MG capsule   Oral   Take 1 capsule (100 mg total) by mouth 2 (two) times daily.   60 capsule   2   . dutasteride (AVODART) 0.5 MG capsule   Oral   Take 0.5 mg by mouth daily.         . feeding supplement, ENSURE ENLIVE, (ENSURE ENLIVE) LIQD   Oral   Take 237 mLs by mouth 2 (two) times daily between meals.   237 mL   12   . gabapentin (NEURONTIN) 100 MG capsule   Oral   Take 300 mg by mouth at bedtime.          Marland Kitchen latanoprost (XALATAN) 0.005 % ophthalmic solution   Ophthalmic   Apply 1 drop to eye. At night         . lisinopril (PRINIVIL,ZESTRIL) 20 MG tablet   Oral   Take 20 mg by mouth daily.         . metoCLOPramide (REGLAN) 10 MG tablet  Oral   Take 1 tablet (10 mg total) by mouth 3 (three) times daily with meals. Patient not taking: Reported on 08/24/2015   30 tablet   1   . omeprazole (PRILOSEC) 40 MG capsule      TAKE ONE CAPSULE DAILY USUALLY THIRTY MINUTES BEFORE BREAKFAST   30 capsule   2   . polyethylene glycol (MIRALAX / GLYCOLAX) packet   Oral   Take 17 g by mouth daily as needed for mild constipation.   14 each   0   . pravastatin (PRAVACHOL) 20 MG tablet   Oral   Take 20 mg by mouth at bedtime.          . predniSONE (DELTASONE) 20 MG tablet   Oral   Take 1 tablet (20 mg total) by mouth daily with breakfast.   30 tablet   0   . tamsulosin (FLOMAX) 0.4 MG CAPS capsule   Oral   Take 2 capsules (0.8 mg total) by mouth daily. Patient not taking: Reported on 08/24/2015   60 capsule   0   . zolpidem (AMBIEN) 5 MG tablet      TAKE ONE TABLET AT BEDTIME AS NEEDED FOR SLEEPLESSNESS   30 tablet   1     FAXED REQUEST TO FOLLOW CONTROLLED SUBSTANCE      Allergies Review of patient's allergies indicates no known allergies.  Family History  Problem Relation Age of Onset  . Cancer Mother   . Multiple sclerosis Father     Social History Social History  Substance Use Topics  . Smoking status: Never Smoker   . Smokeless tobacco: None  . Alcohol Use: No    Review of Systems Constitutional: No fever/chills Eyes: No visual changes. ENT: Positive for right nosebleed. No sore throat. Cardiovascular: Denies chest pain. Respiratory: Denies shortness of breath. Gastrointestinal: No abdominal pain.  No nausea, no vomiting.  No diarrhea.  No constipation. Genitourinary: Negative for dysuria. Musculoskeletal: Negative for back pain. Skin: Negative for rash. Neurological: Negative for headaches, focal weakness or numbness.  10-point ROS otherwise negative.  ____________________________________________   PHYSICAL EXAM:  VITAL SIGNS: ED Triage Vitals  Enc Vitals Group     BP 10/16/15 2157 141/97 mmHg     Pulse Rate 10/16/15 2157 61     Resp 10/16/15 2157 18     Temp 10/16/15 2157 97.8 F (36.6 C)     Temp Source 10/16/15 2157 Oral     SpO2 10/16/15 2157 98 %     Weight --      Height --      Head Cir --      Peak Flow --      Pain Score --      Pain Loc --      Pain Edu? --      Excl. in [place]? --     Constitutional: Alert and oriented. Well appearing and in no acute distress. Eyes: Conjunctivae are normal. PERRL. EOMI. Head: Atraumatic. Nose: No congestion/rhinnorhea. No bleeding from either nares. Right septum with scab which is likely the source of patient's bleeding.. Mouth/Throat: Mucous membranes are moist.  Oropharynx non-erythematous. Neck: No stridor.   Cardiovascular: Normal rate, regular rhythm. Grossly normal heart sounds.  Good peripheral circulation. Respiratory: Normal respiratory effort.  No retractions. Lungs CTAB. Gastrointestinal: Soft and nontender. No distention. No abdominal bruits. No CVA  tenderness. Musculoskeletal: No lower extremity tenderness nor edema.  No joint effusions. Neurologic:  Normal speech and language. No gross  focal neurologic deficits are appreciated. No gait instability. Skin:  Skin is warm, dry and intact. No rash noted. Psychiatric: Mood and affect are normal. Speech and behavior are normal.  ____________________________________________   LABS (all labs ordered are listed, but only abnormal results are displayed)  Labs Reviewed  CBC - Abnormal; Notable for the following:    RBC 3.72 (*)    Hemoglobin 10.9 (*)    HCT 32.3 (*)    RDW 18.3 (*)    Platelets 94 (*)    All other components within normal limits  PROTIME-INR   ____________________________________________  EKG  None ____________________________________________  RADIOLOGY  None ____________________________________________   PROCEDURES  Procedure(s) performed: None  Critical Care performed: No  ____________________________________________   INITIAL IMPRESSION / ASSESSMENT AND PLAN / ED COURSE  Pertinent labs & imaging results that were available during my care of the patient were reviewed by me and considered in my medical decision making (see chart for details).  81 year old male with a history of frequent nosebleeds secondary to ITP. Platelets tonight 94. There is currently no active bleeding. Will prepare nosebleed kit (Afrin, nasal clamp, cotton balls) for patient to use at home in case of recurrent bleeding. Strict return precautions given. Patient and spouse verbalized understanding and agree with plan of care. ____________________________________________   FINAL CLINICAL IMPRESSION(S) / ED DIAGNOSES  Final diagnoses:  Epistaxis  Thrombocytopenia ([place])      Paulette Blanch, MD 10/17/15 [id number]

## 2015-10-18 ENCOUNTER — Telehealth: Payer: Self-pay | Admitting: *Deleted

## 2015-10-18 NOTE — Telephone Encounter (Signed)
Pt had nose bleed over the weekend and went to ER.  They stayed there several hours and he finally stopped bleeding and ER md saw the scabbed area that it started from.  His plt was higher in the 90's and she also was told by ER md that pt should get appt with ENT so they can cauterize some areas in nose.  Pt has had it done before several years ago.  Wife wanted to know if pt still needs to come in today and after checking with Dr. B he said no.  I called pt and spoke to wife to let her know that.  Also he should stay on prednisone 10 mg daily.  She read off the next appt and it was not in system correctly and then md was booked for 12/9 so appt changed and the only day that worked with wife and other appts was 12/12. tia made appt and mailed to pt.

## 2015-10-27 ENCOUNTER — Other Ambulatory Visit: Payer: PPO

## 2015-10-27 ENCOUNTER — Telehealth: Payer: Self-pay | Admitting: *Deleted

## 2015-10-27 MED ORDER — OMEPRAZOLE 40 MG PO CPDR: DELAYED_RELEASE_CAPSULE | ORAL | Status: DC

## 2015-10-27 NOTE — Telephone Encounter (Signed)
Escribed

## 2015-10-28 ENCOUNTER — Other Ambulatory Visit: Payer: PPO

## 2015-10-28 ENCOUNTER — Ambulatory Visit: Payer: PPO | Admitting: Internal Medicine

## 2015-10-29 ENCOUNTER — Encounter: Payer: Self-pay | Admitting: *Deleted

## 2015-11-01 ENCOUNTER — Other Ambulatory Visit: Payer: PPO

## 2015-11-01 ENCOUNTER — Ambulatory Visit: Payer: PPO | Admitting: Internal Medicine

## 2015-11-01 ENCOUNTER — Inpatient Hospital Stay: Payer: PPO | Attending: Internal Medicine | Admitting: Internal Medicine

## 2015-11-01 ENCOUNTER — Inpatient Hospital Stay: Payer: PPO

## 2015-11-01 VITALS — BP 118/71 | HR 60 | Temp 97.8°F | Ht 69.0 in | Wt 168.4 lb

## 2015-11-01 DIAGNOSIS — D591 Other autoimmune hemolytic anemias: Secondary | ICD-10-CM | POA: Insufficient documentation

## 2015-11-01 DIAGNOSIS — D693 Immune thrombocytopenic purpura: Secondary | ICD-10-CM | POA: Diagnosis not present

## 2015-11-01 DIAGNOSIS — E7800 Pure hypercholesterolemia, unspecified: Secondary | ICD-10-CM | POA: Insufficient documentation

## 2015-11-01 DIAGNOSIS — Z79899 Other long term (current) drug therapy: Secondary | ICD-10-CM | POA: Insufficient documentation

## 2015-11-01 DIAGNOSIS — Z8546 Personal history of malignant neoplasm of prostate: Secondary | ICD-10-CM | POA: Diagnosis not present

## 2015-11-01 DIAGNOSIS — I10 Essential (primary) hypertension: Secondary | ICD-10-CM | POA: Diagnosis not present

## 2015-11-01 LAB — CBC WITH DIFFERENTIAL/PLATELET
BASOS ABS: 0 10*3/uL (ref 0–0.1)
BASOS PCT: 0 %
EOS ABS: 0 10*3/uL (ref 0–0.7)
EOS PCT: 0 %
HEMATOCRIT: 32.8 % — AB (ref 40.0–52.0)
Hemoglobin: 11 g/dL — ABNORMAL LOW (ref 13.0–18.0)
Lymphocytes Relative: 13 %
Lymphs Abs: 0.9 10*3/uL — ABNORMAL LOW (ref 1.0–3.6)
MCH: 29.7 pg (ref 26.0–34.0)
MCHC: 33.5 g/dL (ref 32.0–36.0)
MCV: 88.8 fL (ref 80.0–100.0)
MONO ABS: 0.4 10*3/uL (ref 0.2–1.0)
MONOS PCT: 5 %
NEUTROS ABS: 6.2 10*3/uL (ref 1.4–6.5)
Neutrophils Relative %: 82 %
PLATELETS: 119 10*3/uL — AB (ref 150–440)
RBC: 3.69 MIL/uL — ABNORMAL LOW (ref 4.40–5.90)
RDW: 17.2 % — AB (ref 11.5–14.5)
WBC: 7.5 10*3/uL (ref 3.8–10.6)

## 2015-11-01 NOTE — Progress Notes (Signed)
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 I reviewed the patient's prior charts/pertinent labs/imaging in detail; findings are summarized above.   He was in the emergency room approximate 4 weeks ago with nosebleeds; his platelets were around 90,000 at the time. He has also seen ENT physician since then.  I have reviewed the emergency note.   A very pleasant 81 year old male patient with above history of autoimmune hemolytic anemia along with ITP- currently relapsed in October 2016 on prednisone 10 mg is here for follow-up.  Patient denies any difficulty swallowing or pain with swallowing. Denies any swelling in the legs or shortness of breath or chest pain.   REVIEW OF SYSTEMS:  A complete 10 point review of system is done which is negative except mentioned above/history of present illness.   PAST MEDICAL HISTORY :  Past Medical History  Diagnosis Date  . Shingles   . Dehydration   . Hypertension   . Prostate cancer ([place])   . Hypercholesteremia   . 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 Insomnia   . Leukocytosis   . 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 Hernia repair    . Eye surgery    . Artificial left eye    . Joint replacement      FAMILY HISTORY :   Family History  Problem Relation Age of Onset  . Cancer Mother   . Multiple sclerosis Father     SOCIAL HISTORY:   Social History  Substance Use Topics  . Smoking status: Never Smoker   . Smokeless tobacco: Not on file  . Alcohol Use: No    ALLERGIES:  is allergic to cephalexin and  hydrocodone-acetaminophen.  MEDICATIONS:  Current Outpatient Prescriptions  Medication Sig Dispense Refill  . acetaminophen (TYLENOL) 500 MG tablet Take 500 mg by mouth every 6 (six) hours as needed for mild pain.    . citalopram (CELEXA) 10 MG tablet Take 10 mg by mouth daily.    . Cyanocobalamin (RA VITAMIN B-12 TR) 1000 MCG TBCR Take 1,000 mcg by mouth daily.    Marland Kitchen docusate sodium (COLACE) 100 MG capsule Take 1 capsule (100 mg total) by mouth 2 (two) times daily. 60 capsule 2  . feeding supplement, ENSURE ENLIVE, (ENSURE ENLIVE) LIQD Take 237 mLs by mouth 2 (two) times daily between meals. 237 mL 12  . gabapentin (NEURONTIN) 100 MG capsule Take 300 mg by mouth at bedtime.     Marland Kitchen latanoprost (XALATAN) 0.005 % ophthalmic solution Apply 1 drop to eye. At night    . lisinopril (PRINIVIL,ZESTRIL) 20 MG tablet Take 20 mg by mouth daily.    Marland Kitchen omeprazole (PRILOSEC) 40 MG capsule TAKE ONE CAPSULE DAILY USUALLY THIRTY MINUTES BEFORE BREAKFAST 30 capsule 2  . polyethylene glycol (MIRALAX / GLYCOLAX) packet Take 17 g by mouth daily as needed for mild constipation. 14 each 0  . pravastatin (PRAVACHOL) 20 MG tablet Take 20 mg by mouth at bedtime.     . predniSONE (DELTASONE) 20 MG tablet Take 1 tablet (20 mg total) by mouth daily with breakfast. (Patient  taking differently: Take 10 mg by mouth daily with breakfast. ) 30 tablet 0  . zolpidem (AMBIEN) 5 MG tablet TAKE ONE TABLET AT BEDTIME AS NEEDED FOR SLEEPLESSNESS 30 tablet 1   No current facility-administered medications for this visit.    PHYSICAL EXAMINATION:   BP 118/71 mmHg  Pulse 60  Temp(Src) 97.8 F (36.6 C) (Oral)  Ht 5\' 9"  (1.753 m)  Wt 168 lb 6.9 oz (76.4 kg)  BMI 24.86 kg/m2  Filed Weights   11/01/15 1507  Weight: 168 lb 6.9 oz (76.4 kg)    GENERAL: Well-nourished well-developed; Alert, no distress and comfortable.  Accompanied by his wife. EYES: no pallor or icterus OROPHARYNX: no thrush or ulceration; good dentition no  bleeding noted. NECK: supple, no masses felt LYMPH:  no palpable lymphadenopathy in the cervical, axillary or inguinal regions LUNGS: clear to auscultation and  No wheeze or crackles HEART/CVS: regular rate & rhythm and no murmurs; No lower extremity edema ABDOMEN:abdomen soft, non-tender and normal bowel sounds Musculoskeletal:no cyanosis of digits and no clubbing  PSYCH: alert & oriented x 3 with fluent speech NEURO: no focal motor/sensory deficits SKIN:  no rashes or significant lesions  LABORATORY DATA:  I have reviewed the data as listed    Component Value Date/Time   NA 131* 09/01/2015 0957   NA 131* 03/17/2015 1425   K 4.1 09/01/2015 0957   K 3.9 03/17/2015 1425   CL 102 09/01/2015 0957   CL 101 03/17/2015 1425   CO2 25 09/01/2015 0957   CO2 26 03/17/2015 1425   GLUCOSE 114* 09/01/2015 0957   GLUCOSE 161* 03/17/2015 1425   BUN 18 09/01/2015 0957   BUN 17 03/17/2015 1425   CREATININE 0.88 09/01/2015 0957   CREATININE 0.90 03/17/2015 1425   CALCIUM 8.2* 09/01/2015 0957   CALCIUM 8.5* 03/17/2015 1425   PROT 6.9 09/01/2015 0957   PROT 6.7 03/17/2015 1425   ALBUMIN 3.9 09/01/2015 0957   ALBUMIN 3.7 03/17/2015 1425   AST 28 09/01/2015 0957   AST 25 03/17/2015 1425   ALT 21 09/01/2015 0957   ALT 23 03/17/2015 1425   ALKPHOS 65 09/01/2015 0957   ALKPHOS 44 03/17/2015 1425   BILITOT 0.5 09/01/2015 0957   BILITOT 0.6 03/17/2015 1425   GFRNONAA >60 09/01/2015 0957   GFRNONAA >60 03/17/2015 1425   GFRNONAA >60 12/09/2014 0431   GFRAA >60 09/01/2015 0957   GFRAA >60 03/17/2015 1425   GFRAA >60 12/09/2014 0431    No results found for: SPEP, UPEP  Lab Results  Component Value Date   WBC 7.5 11/01/2015   NEUTROABS 6.2 11/01/2015   HGB 11.0* 11/01/2015   HCT 32.8* 11/01/2015   MCV 88.8 11/01/2015   PLT 119* 11/01/2015      Chemistry      Component Value Date/Time   NA 131* 09/01/2015 0957   NA 131* 03/17/2015 1425   K 4.1 09/01/2015 0957   K 3.9 03/17/2015  1425   CL 102 09/01/2015 0957   CL 101 03/17/2015 1425   CO2 25 09/01/2015 0957   CO2 26 03/17/2015 1425   BUN 18 09/01/2015 0957   BUN 17 03/17/2015 1425   CREATININE 0.88 09/01/2015 0957   CREATININE 0.90 03/17/2015 1425      Component Value Date/Time   CALCIUM 8.2* 09/01/2015 0957   CALCIUM 8.5* 03/17/2015 1425   ALKPHOS 65 09/01/2015 0957   ALKPHOS 44 03/17/2015 1425   AST 28 09/01/2015 0957   AST 25 03/17/2015 1425  ALT 21 09/01/2015 0957   ALT 23 03/17/2015 1425   BILITOT 0.5 09/01/2015 0957   BILITOT 0.6 03/17/2015 1425       RADIOGRAPHIC STUDIES: I have personally reviewed the radiological images as listed and agreed with the findings in the report. No results found.   ASSESSMENT & PLAN:   # ITP- steroid responsive; relapsed currently on low-dose of prednisone 10 mg a day. He has been on steroids since October 2016. His platelet count today is 119. I recommend further tapering of the steroids to 10 mg every other day. Recommend checking CBC every 2 weeks.  # Autoimmune hemolytic anemia- hemoglobin stable at 11. Monitor for now.  # I had a long discussion the patient is likely regarding the other treatment options for his ITP/autoimmune hemolytic anemia including # rituximab# IVIG # Promacta #continued prednisone. However my preference is to take him off prednisone fairly quickly.     All questions were answered. The patient knows to call the clinic with any problems, questions or concerns. No barriers to learning was detected.  # Plan follow-up with me in 6 weeks.  # 25 minutes face-to-face with the patient discussing the above plan of care; more than 50% of time spent on prognosis/ natural history; counseling and coordination.     [name], MD 11/01/2015 3:20 PM

## 2015-11-16 ENCOUNTER — Encounter: Payer: Self-pay | Admitting: *Deleted

## 2015-11-16 ENCOUNTER — Telehealth: Payer: Self-pay | Admitting: *Deleted

## 2015-11-16 ENCOUNTER — Inpatient Hospital Stay: Payer: PPO

## 2015-11-16 ENCOUNTER — Other Ambulatory Visit: Payer: Self-pay | Admitting: Oncology

## 2015-11-16 DIAGNOSIS — D693 Immune thrombocytopenic purpura: Secondary | ICD-10-CM

## 2015-11-16 LAB — CBC WITH DIFFERENTIAL/PLATELET
Basophils Absolute: 0 10*3/uL (ref 0–0.1)
EOS ABS: 0.2 10*3/uL (ref 0–0.7)
HCT: 34.4 % — ABNORMAL LOW (ref 40.0–52.0)
Hemoglobin: 11.4 g/dL — ABNORMAL LOW (ref 13.0–18.0)
LYMPHS ABS: 1.7 10*3/uL (ref 1.0–3.6)
Lymphocytes Relative: 22 %
MCH: 29.6 pg (ref 26.0–34.0)
MCHC: 33 g/dL (ref 32.0–36.0)
MCV: 89.8 fL (ref 80.0–100.0)
Monocytes Absolute: 0.7 10*3/uL (ref 0.2–1.0)
Neutro Abs: 5 10*3/uL (ref 1.4–6.5)
PLATELETS: 13 10*3/uL — AB (ref 150–440)
RBC: 3.83 MIL/uL — ABNORMAL LOW (ref 4.40–5.90)
RDW: 16.1 % — ABNORMAL HIGH (ref 11.5–14.5)
WBC: 7.6 10*3/uL (ref 3.8–10.6)

## 2015-11-16 NOTE — Progress Notes (Signed)
Patient came in today for lab only visit, labs revealed platelet count of 13. Per Dr. Grayland Ormond patient to increase Prednisone to 20 mg per day until Friday, patient set up for lab only appointment on Friday to recheck platelet count. Patient repeated plan back to me and verbalized agreement.

## 2015-11-16 NOTE — Telephone Encounter (Signed)
plt count critical called to [facility] by [facility], RN.  Pt in lobby waiting on lab results. plt count is 13. This has significantly dropped from 2 weeks ago, which was 119.  Rn called Argentina Donovan, RN for Dr. Grayland Ormond (on call md) for advice. Patient is currently taking prednisone 5 mg tablet every other day. Waiting on md response.

## 2015-11-16 NOTE — Telephone Encounter (Signed)
Per Tillie Rung, Dr. Grayland Ormond recommended to increase patient's prednisone to 20 mg every day and recheck labs again on Friday.

## 2015-11-19 ENCOUNTER — Other Ambulatory Visit: Payer: PPO

## 2015-11-19 ENCOUNTER — Telehealth: Payer: Self-pay | Admitting: *Deleted

## 2015-11-19 ENCOUNTER — Inpatient Hospital Stay: Payer: PPO

## 2015-11-19 DIAGNOSIS — D693 Immune thrombocytopenic purpura: Secondary | ICD-10-CM

## 2015-11-19 LAB — CBC WITH DIFFERENTIAL/PLATELET
Basophils Absolute: 0 10*3/uL (ref 0–0.1)
EOS ABS: 0.1 10*3/uL (ref 0–0.7)
HCT: 34 % — ABNORMAL LOW (ref 40.0–52.0)
Hemoglobin: 11.5 g/dL — ABNORMAL LOW (ref 13.0–18.0)
LYMPHS ABS: 2.3 10*3/uL (ref 1.0–3.6)
Lymphocytes Relative: 21 %
MCH: 30 pg (ref 26.0–34.0)
MCHC: 33.7 g/dL (ref 32.0–36.0)
MCV: 88.9 fL (ref 80.0–100.0)
Monocytes Absolute: 0.8 10*3/uL (ref 0.2–1.0)
Neutro Abs: 7.9 10*3/uL — ABNORMAL HIGH (ref 1.4–6.5)
Neutrophils Relative %: 71 %
PLATELETS: 21 10*3/uL — AB (ref 150–400)
RBC: 3.82 MIL/uL — ABNORMAL LOW (ref 4.40–5.90)
RDW: 16 % — ABNORMAL HIGH (ref 11.5–14.5)
WBC: 11.2 10*3/uL — AB (ref 3.8–10.6)

## 2015-11-19 NOTE — Telephone Encounter (Signed)
946am-RN spoke with Lanelle Bal in lab. plt count today is 21. Read back process performed. Rn called and spoke to [place], [place] Dr. Gary Fleet nurse. Pending MD advice on plt counts and when to recheck labs.

## 2015-11-23 ENCOUNTER — Inpatient Hospital Stay: Payer: PPO | Attending: Internal Medicine

## 2015-11-23 ENCOUNTER — Inpatient Hospital Stay (HOSPITAL_BASED_OUTPATIENT_CLINIC_OR_DEPARTMENT_OTHER): Payer: PPO | Admitting: Oncology

## 2015-11-23 VITALS — BP 146/74 | HR 58 | Temp 96.9°F | Resp 18 | Wt 168.2 lb

## 2015-11-23 DIAGNOSIS — E7800 Pure hypercholesterolemia, unspecified: Secondary | ICD-10-CM | POA: Insufficient documentation

## 2015-11-23 DIAGNOSIS — D693 Immune thrombocytopenic purpura: Secondary | ICD-10-CM | POA: Insufficient documentation

## 2015-11-23 DIAGNOSIS — Z8546 Personal history of malignant neoplasm of prostate: Secondary | ICD-10-CM | POA: Insufficient documentation

## 2015-11-23 DIAGNOSIS — D591 Other autoimmune hemolytic anemias: Secondary | ICD-10-CM | POA: Diagnosis not present

## 2015-11-23 DIAGNOSIS — Z79899 Other long term (current) drug therapy: Secondary | ICD-10-CM | POA: Insufficient documentation

## 2015-11-23 DIAGNOSIS — I10 Essential (primary) hypertension: Secondary | ICD-10-CM | POA: Insufficient documentation

## 2015-11-23 LAB — CBC WITH DIFFERENTIAL/PLATELET
BASOS PCT: 0 %
Basophils Absolute: 0 10*3/uL (ref 0–0.1)
EOS ABS: 0 10*3/uL (ref 0–0.7)
EOS PCT: 0 %
HCT: 33.8 % — ABNORMAL LOW (ref 40.0–52.0)
Hemoglobin: 11.3 g/dL — ABNORMAL LOW (ref 13.0–18.0)
LYMPHS ABS: 0.9 10*3/uL — AB (ref 1.0–3.6)
Lymphocytes Relative: 12 %
MCH: 30.1 pg (ref 26.0–34.0)
MCHC: 33.3 g/dL (ref 32.0–36.0)
MCV: 90.2 fL (ref 80.0–100.0)
MONOS PCT: 4 %
Monocytes Absolute: 0.3 10*3/uL (ref 0.2–1.0)
NEUTROS PCT: 84 %
Neutro Abs: 6.4 10*3/uL (ref 1.4–6.5)
PLATELETS: 56 10*3/uL — AB (ref 150–440)
RBC: 3.75 MIL/uL — ABNORMAL LOW (ref 4.40–5.90)
RDW: 15.6 % — ABNORMAL HIGH (ref 11.5–14.5)
WBC: 7.6 10*3/uL (ref 3.8–10.6)

## 2015-11-23 NOTE — Telephone Encounter (Signed)
Spoke with patient on 12/30. He was given an apt to come on 11/22/15 to see Dr. Grayland Ormond and for lab recheck. Per wife, the patient and his wife would like to discuss rituxan with md.

## 2015-11-23 NOTE — Progress Notes (Signed)
Patient has symptoms of fatigue.

## 2015-11-23 NOTE — Progress Notes (Signed)
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 [name] was noted to have a declining platelet count last week and [name] prednisone dose was increased to 20 mg daily. Currently, he has fatigue but otherwise feels well. He has no neurologic complaints. He denies any easy bleeding or bruising. He denies any recent fevers. He has no new medications. He denies any chest pain or shortness of breath. He has a good appetite and denies weight loss. He denies any nausea, vomiting, constipation, or diarrhea. He has no urinary complaints [name] otherwise feels well and offers no further specific complaints.   REVIEW OF SYSTEMS:  A complete 10 point review of system is done which is negative except mentioned above/history of present illness.   PAST MEDICAL HISTORY :  Past Medical History  Diagnosis Date  . Shingles   . Dehydration   . Hypertension   . Prostate cancer ([place])   . Hypercholesteremia   . 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 Insomnia   . Leukocytosis   . 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 Hernia repair    . Eye surgery    . Artificial left eye    . Joint replacement      FAMILY HISTORY :   Family History  Problem Relation Age of Onset  . Cancer Mother   . Multiple sclerosis Father     SOCIAL HISTORY:   Social History  Substance Use Topics  . Smoking status: Never Smoker   . Smokeless tobacco: Not on file  . Alcohol Use: No    ALLERGIES:  is allergic to cephalexin and hydrocodone-acetaminophen.  MEDICATIONS:  Current Outpatient Prescriptions  Medication Sig Dispense  Refill  . acetaminophen (TYLENOL) 500 MG tablet Take 500 mg by mouth every 6 (six) hours as needed for mild pain.    . citalopram (CELEXA) 10 MG tablet Take 10 mg by mouth daily.    . Cyanocobalamin (RA VITAMIN B-12 TR) 1000 MCG TBCR Take 1,000 mcg by mouth daily.    Marland Kitchen docusate sodium (COLACE) 100 MG capsule Take 1 capsule (100 mg total) by mouth 2 (two) times daily. 60 capsule 2  . feeding supplement, ENSURE ENLIVE, (ENSURE ENLIVE) LIQD Take 237 mLs by mouth 2 (two) times daily between meals. 237 mL 12  . gabapentin (NEURONTIN) 100 MG capsule Take 300 mg by mouth at bedtime.     Marland Kitchen latanoprost (XALATAN) 0.005 % ophthalmic solution Apply 1 drop to eye. At night    . lisinopril (PRINIVIL,ZESTRIL) 20 MG tablet Take 20 mg by mouth daily.    Marland Kitchen omeprazole (PRILOSEC) 40 MG capsule TAKE ONE CAPSULE DAILY USUALLY THIRTY MINUTES BEFORE BREAKFAST 30 capsule 2  . polyethylene glycol (MIRALAX / GLYCOLAX) packet Take 17 g by mouth daily as needed for mild constipation. 14 each 0  . pravastatin (PRAVACHOL) 20 MG tablet Take 20 mg by mouth at bedtime.     . predniSONE (DELTASONE) 20 MG tablet Take 1 tablet (20 mg total) by mouth daily with breakfast. ([name] taking differently: Take 10 mg by mouth daily with breakfast. ) 30 tablet 0  .  zolpidem (AMBIEN) 5 MG tablet TAKE ONE TABLET AT BEDTIME AS NEEDED FOR SLEEPLESSNESS 30 tablet 1   No current facility-administered medications for this visit.    PHYSICAL EXAMINATION:   BP 146/74 mmHg  Pulse 58  Temp(Src) 96.9 F (36.1 C) (Tympanic)  Resp 18  Wt 168 lb 3.4 oz (76.3 kg)  Filed Weights   11/23/15 1349  Weight: 168 lb 3.4 oz (76.3 kg)    GENERAL: Well-nourished well-developed; Alert, no distress and comfortable.  Accompanied by [name] wife. EYES: no pallor or icterus OROPHARYNX: no thrush or ulceration; good dentition no bleeding noted. NECK: supple, no masses felt LYMPH:  no palpable lymphadenopathy in the cervical, axillary or inguinal  regions LUNGS: clear to auscultation and  No wheeze or crackles HEART/CVS: regular rate & rhythm and no murmurs; No lower extremity edema ABDOMEN:abdomen soft, non-tender and normal bowel sounds Musculoskeletal:no cyanosis of digits and no clubbing  PSYCH: alert & oriented x 3 with fluent speech NEURO: no focal motor/sensory deficits SKIN:  no rashes or significant lesions  LABORATORY DATA:  I have reviewed the data as listed    Component Value Date/Time   NA 131* 09/01/2015 0957   NA 131* 03/17/2015 1425   K 4.1 09/01/2015 0957   K 3.9 03/17/2015 1425   CL 102 09/01/2015 0957   CL 101 03/17/2015 1425   CO2 25 09/01/2015 0957   CO2 26 03/17/2015 1425   GLUCOSE 114* 09/01/2015 0957   GLUCOSE 161* 03/17/2015 1425   BUN 18 09/01/2015 0957   BUN 17 03/17/2015 1425   CREATININE 0.88 09/01/2015 0957   CREATININE 0.90 03/17/2015 1425   CALCIUM 8.2* 09/01/2015 0957   CALCIUM 8.5* 03/17/2015 1425   PROT 6.9 09/01/2015 0957   PROT 6.7 03/17/2015 1425   ALBUMIN 3.9 09/01/2015 0957   ALBUMIN 3.7 03/17/2015 1425   AST 28 09/01/2015 0957   AST 25 03/17/2015 1425   ALT 21 09/01/2015 0957   ALT 23 03/17/2015 1425   ALKPHOS 65 09/01/2015 0957   ALKPHOS 44 03/17/2015 1425   BILITOT 0.5 09/01/2015 0957   BILITOT 0.6 03/17/2015 1425   GFRNONAA >60 09/01/2015 0957   GFRNONAA >60 03/17/2015 1425   GFRNONAA >60 12/09/2014 0431   GFRAA >60 09/01/2015 0957   GFRAA >60 03/17/2015 1425   GFRAA >60 12/09/2014 0431    No results found for: SPEP, UPEP  Lab Results  Component Value Date   WBC 7.6 11/23/2015   NEUTROABS 6.4 11/23/2015   HGB 11.3* 11/23/2015   HCT 33.8* 11/23/2015   MCV 90.2 11/23/2015   PLT 56* 11/23/2015      Chemistry      Component Value Date/Time   NA 131* 09/01/2015 0957   NA 131* 03/17/2015 1425   K 4.1 09/01/2015 0957   K 3.9 03/17/2015 1425   CL 102 09/01/2015 0957   CL 101 03/17/2015 1425   CO2 25 09/01/2015 0957   CO2 26 03/17/2015 1425   BUN 18  09/01/2015 0957   BUN 17 03/17/2015 1425   CREATININE 0.88 09/01/2015 0957   CREATININE 0.90 03/17/2015 1425      Component Value Date/Time   CALCIUM 8.2* 09/01/2015 0957   CALCIUM 8.5* 03/17/2015 1425   ALKPHOS 65 09/01/2015 0957   ALKPHOS 44 03/17/2015 1425   AST 28 09/01/2015 0957   AST 25 03/17/2015 1425   ALT 21 09/01/2015 0957   ALT 23 03/17/2015 1425   BILITOT 0.5 09/01/2015 0957   BILITOT 0.6 03/17/2015  1425       RADIOGRAPHIC STUDIES: I have personally reviewed the radiological images as listed and agreed with the findings in the report. No results found.   ASSESSMENT & PLAN:   # ITP- steroid responsive; [name]'s prednisone dose was increased to 20 mg daily with improvement of [name] platelet count to 56. After lengthy discussion with the [name], he would prefer to discontinue prednisone and try treatment that would be more durable. He has agreed to proceeding with IV Rituxan weekly 4. Return to clinic in approximately one week to see Dr. Rogue Bussing and consideration of Rituxan. [name] was instructed to maintain [name] 20 mg prednisone daily and will have [name] dose tapered at [name] next clinic visit.   # Autoimmune hemolytic anemia- hemoglobin stable at 11.3 Monitor for now.  Approximately 30 minutes was spent in discussion of which greater than 50% was consultation.    Lloyd Huger, MD 11/23/2015 4:32 PM

## 2015-11-30 ENCOUNTER — Inpatient Hospital Stay: Payer: PPO

## 2015-12-01 ENCOUNTER — Other Ambulatory Visit: Payer: Self-pay | Admitting: Internal Medicine

## 2015-12-01 ENCOUNTER — Telehealth: Payer: Self-pay | Admitting: *Deleted

## 2015-12-01 ENCOUNTER — Inpatient Hospital Stay: Payer: PPO | Admitting: *Deleted

## 2015-12-01 DIAGNOSIS — D693 Immune thrombocytopenic purpura: Secondary | ICD-10-CM | POA: Diagnosis not present

## 2015-12-01 LAB — CBC WITH DIFFERENTIAL/PLATELET
BASOS ABS: 0 10*3/uL (ref 0–0.1)
EOS ABS: 0 10*3/uL (ref 0–0.7)
Eosinophils Relative: 0 %
HCT: 34.6 % — ABNORMAL LOW (ref 40.0–52.0)
HEMOGLOBIN: 11.5 g/dL — AB (ref 13.0–18.0)
Lymphocytes Relative: 11 %
Lymphs Abs: 0.8 10*3/uL — ABNORMAL LOW (ref 1.0–3.6)
MCH: 29.9 pg (ref 26.0–34.0)
MCHC: 33.3 g/dL (ref 32.0–36.0)
MCV: 89.6 fL (ref 80.0–100.0)
Monocytes Absolute: 0.3 10*3/uL (ref 0.2–1.0)
Neutro Abs: 5.8 10*3/uL (ref 1.4–6.5)
Platelets: 27 10*3/uL — CL (ref 150–440)
RBC: 3.86 MIL/uL — ABNORMAL LOW (ref 4.40–5.90)
RDW: 15.1 % — AB (ref 11.5–14.5)
WBC: 6.9 10*3/uL (ref 3.8–10.6)

## 2015-12-01 NOTE — Telephone Encounter (Signed)
Got a call from wife states that pt at dentist and had nose bleed . Thinks it stopped now and going to call [facility] to see if he can cauterize area.  Called back to home and no answer.  10 min. Later got another call on voicemail that pt still has nose bleed and mcqueen could not see him today.  They are heading to ER. Please call. Called wife on her cell phone she left and spoke to her. The bleeding has stopped but wants to know if he can come over and have cbc checked.  Per Dr. B it is ok.  Entered cbc and pt checked.  plt count  Preliminary from machine was 24 and 27. Pt was told that he can go home but if nose bleed starts again with plt that low he will have to go to ER and he and his wife agree to do that.  I came back up and spoke to Dr. B and he states that tell him to take 20mg  extra of prednisone today and then tom 40 mg and Friday 40 mg.  He is due to see Dr. Jacinto Reap on Friday.  If they have any other problems they can contact our office but if nose bleed starts back he needs to go to ER.  They both are in agreement.

## 2015-12-02 ENCOUNTER — Ambulatory Visit: Payer: PPO | Admitting: Internal Medicine

## 2015-12-02 ENCOUNTER — Other Ambulatory Visit: Payer: PPO

## 2015-12-03 ENCOUNTER — Encounter: Payer: Self-pay | Admitting: *Deleted

## 2015-12-03 ENCOUNTER — Inpatient Hospital Stay: Payer: PPO

## 2015-12-03 ENCOUNTER — Encounter: Payer: Self-pay | Admitting: Internal Medicine

## 2015-12-03 ENCOUNTER — Inpatient Hospital Stay (HOSPITAL_BASED_OUTPATIENT_CLINIC_OR_DEPARTMENT_OTHER): Payer: PPO | Admitting: Internal Medicine

## 2015-12-03 VITALS — BP 135/80 | HR 61 | Temp 97.6°F | Resp 18 | Wt 170.6 lb

## 2015-12-03 DIAGNOSIS — D693 Immune thrombocytopenic purpura: Secondary | ICD-10-CM

## 2015-12-03 DIAGNOSIS — Z79899 Other long term (current) drug therapy: Secondary | ICD-10-CM | POA: Diagnosis not present

## 2015-12-03 DIAGNOSIS — D591 Other autoimmune hemolytic anemias: Secondary | ICD-10-CM

## 2015-12-03 LAB — BASIC METABOLIC PANEL
Anion gap: 3 — ABNORMAL LOW (ref 5–15)
BUN: 20 mg/dL (ref 6–20)
CALCIUM: 8.6 mg/dL — AB (ref 8.9–10.3)
CHLORIDE: 105 mmol/L (ref 101–111)
CO2: 26 mmol/L (ref 22–32)
CREATININE: 0.84 mg/dL (ref 0.61–1.24)
GFR calc non Af Amer: >60 mL/min (ref >60)
Glucose, Bld: 126 mg/dL — ABNORMAL HIGH (ref 65–99)
Potassium: 3.4 mmol/L — ABNORMAL LOW (ref 3.5–5.1)
SODIUM: 134 mmol/L — AB (ref 135–145)

## 2015-12-03 LAB — CBC WITH DIFFERENTIAL/PLATELET
BASOS ABS: 0 10*3/uL (ref 0–0.1)
Basophils Relative: 0 %
Eosinophils Absolute: 0.1 10*3/uL (ref 0–0.7)
Eosinophils Relative: 1 %
HCT: 30.8 % — ABNORMAL LOW (ref 40.0–52.0)
Hemoglobin: 10.4 g/dL — ABNORMAL LOW (ref 13.0–18.0)
Lymphs Abs: 1.7 10*3/uL (ref 1.0–3.6)
MCH: 30.3 pg (ref 26.0–34.0)
MCHC: 33.7 g/dL (ref 32.0–36.0)
MCV: 89.8 fL (ref 80.0–100.0)
MONO ABS: 0.6 10*3/uL (ref 0.2–1.0)
Monocytes Relative: 6 %
NEUTROS ABS: 6.8 10*3/uL — AB (ref 1.4–6.5)
PLATELETS: 8 10*3/uL — AB (ref 150–440)
RBC: 3.44 MIL/uL — ABNORMAL LOW (ref 4.40–5.90)
RDW: 14.8 % — AB (ref 11.5–14.5)
WBC: 9.2 10*3/uL (ref 3.8–10.6)

## 2015-12-03 MED ORDER — SODIUM CHLORIDE 0.9 % IV SOLN
Freq: Once | INTRAVENOUS | Status: AC
  Administered 2015-12-03: 09:00:00 via INTRAVENOUS
  Filled 2015-12-03: qty 1000

## 2015-12-03 MED ORDER — DIPHENHYDRAMINE HCL 25 MG PO CAPS
50.0000 mg | ORAL_CAPSULE | Freq: Once | ORAL | Status: AC
  Administered 2015-12-03: 50 mg via ORAL
  Filled 2015-12-03: qty 2

## 2015-12-03 MED ORDER — RITUXIMAB CHEMO INJECTION 500 MG/50ML
375.0000 mg/m2 | Freq: Once | INTRAVENOUS | Status: AC
  Administered 2015-12-03: 700 mg via INTRAVENOUS
  Filled 2015-12-03: qty 60

## 2015-12-03 MED ORDER — ACETAMINOPHEN 325 MG PO TABS
650.0000 mg | ORAL_TABLET | Freq: Once | ORAL | Status: AC
  Administered 2015-12-03: 650 mg via ORAL
  Filled 2015-12-03: qty 2

## 2015-12-03 NOTE — Progress Notes (Signed)
Pt in the clinic today and after Dr. B got the plt count was 8. He told me to let pt know to increase prednisone to 80 mg a day.  When I went to infusion to speak to pt he had taken 40 mg today and I told him to take additional 40 mg this evening and and to take 40 twice a day Sat, Sun, Mon and tues.  After he comes for his lab only on tues he will need phone call with further directions on what to do with prednisone.  Patient agreeable to  Plan and before pt left after treatment wife [name] wanted to tell her also and I did inform her of same info above.

## 2015-12-03 NOTE — Progress Notes (Signed)
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  More recently his platelet count had dropped to 13 ; and had experience nosebleeds. He was restarted back on prednisone 20 mg earlier in the month- however the response has been suboptimal with platelets around 27 most recently. Patient again had episode of nosebleed 2 days ago.    Patient denies any difficulty swallowing or pain with swallowing. Denies any swelling in the legs or shortness of breath or chest pain.  REVIEW OF SYSTEMS:  A complete 10 point review of system is done which is negative except mentioned above/history of present illness.   PAST MEDICAL HISTORY :  Past Medical History  Diagnosis Date  . Shingles   . Dehydration   . Hypertension   . Prostate cancer ([place])   . Hypercholesteremia   . 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 Insomnia   . Leukocytosis   . 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 Hernia repair    . Eye surgery    . Artificial left eye    . Joint replacement    . Prostate surgery      FAMILY HISTORY :   Family History  Problem Relation Age of Onset  . Cancer Mother   . Multiple sclerosis Father     SOCIAL HISTORY:   Social History  Substance Use Topics  . Smoking status: Never Smoker   . Smokeless tobacco: None  . Alcohol Use: No    ALLERGIES:  is allergic to cephalexin and hydrocodone-acetaminophen.  MEDICATIONS:  Current Outpatient  Prescriptions  Medication Sig Dispense Refill  . acetaminophen (TYLENOL) 500 MG tablet Take 500 mg by mouth every 6 (six) hours as needed for mild pain.    . citalopram (CELEXA) 10 MG tablet Take 10 mg by mouth daily.    . Cyanocobalamin (RA VITAMIN B-12 TR) 1000 MCG TBCR Take 1,000 mcg by mouth daily.    Marland Kitchen docusate sodium (COLACE) 100 MG capsule Take 1 capsule (100 mg total) by mouth 2 (two) times daily. 60 capsule 2  . feeding supplement, ENSURE ENLIVE, (ENSURE ENLIVE) LIQD Take 237 mLs by mouth 2 (two) times daily between meals. 237 mL 12  . latanoprost (XALATAN) 0.005 % ophthalmic solution Apply 1 drop to eye. At night    . lisinopril (PRINIVIL,ZESTRIL) 20 MG tablet Take 20 mg by mouth daily.    Marland Kitchen omeprazole (PRILOSEC) 40 MG capsule TAKE ONE CAPSULE DAILY USUALLY THIRTY MINUTES BEFORE BREAKFAST 30 capsule 2  . polyethylene glycol (MIRALAX / GLYCOLAX) packet Take 17 g by mouth daily as needed for mild constipation. 14 each 0  . pravastatin (PRAVACHOL) 20 MG tablet Take 40 mg by mouth at bedtime.     . predniSONE (DELTASONE) 20 MG tablet Take 1 tablet (20 mg total) by mouth daily with breakfast. (Patient taking differently: Take 10 mg by mouth daily with breakfast. ) 30 tablet 0  . zolpidem (AMBIEN) 5 MG tablet TAKE  ONE TABLET AT BEDTIME AS NEEDED FOR SLEEPLESSNESS 30 tablet 1  . gabapentin (NEURONTIN) 100 MG capsule Take 300 mg by mouth at bedtime. Reported on 12/03/2015     No current facility-administered medications for this visit.    PHYSICAL EXAMINATION:   BP 135/80 mmHg  Pulse 61  Temp(Src) 97.6 F (36.4 C) (Oral)  Resp 18  Wt 170 lb 10.2 oz (77.4 kg)  SpO2 96%  Filed Weights   12/03/15 0846  Weight: 170 lb 10.2 oz (77.4 kg)    GENERAL: Well-nourished well-developed; Alert, no distress and comfortable.  Accompanied by his wife. EYES: no pallor or icterus OROPHARYNX: no thrush or ulceration; good dentition no bleeding noted. NECK: supple, no masses felt LYMPH:  no  palpable lymphadenopathy in the cervical, axillary or inguinal regions LUNGS: clear to auscultation and  No wheeze or crackles HEART/CVS: regular rate & rhythm and no murmurs; No lower extremity edema ABDOMEN:abdomen soft, non-tender and normal bowel sounds Musculoskeletal:no cyanosis of digits and no clubbing  PSYCH: alert & oriented x 3 with fluent speech NEURO: no focal motor/sensory deficits SKIN:  no rashes or significant lesions  LABORATORY DATA:  I have reviewed the data as listed    Component Value Date/Time   NA 134* 12/03/2015 0820   NA 131* 03/17/2015 1425   K 3.4* 12/03/2015 0820   K 3.9 03/17/2015 1425   CL 105 12/03/2015 0820   CL 101 03/17/2015 1425   CO2 26 12/03/2015 0820   CO2 26 03/17/2015 1425   GLUCOSE 126* 12/03/2015 0820   GLUCOSE 161* 03/17/2015 1425   BUN 20 12/03/2015 0820   BUN 17 03/17/2015 1425   CREATININE 0.84 12/03/2015 0820   CREATININE 0.90 03/17/2015 1425   CALCIUM 8.6* 12/03/2015 0820   CALCIUM 8.5* 03/17/2015 1425   PROT 6.9 09/01/2015 0957   PROT 6.7 03/17/2015 1425   ALBUMIN 3.9 09/01/2015 0957   ALBUMIN 3.7 03/17/2015 1425   AST 28 09/01/2015 0957   AST 25 03/17/2015 1425   ALT 21 09/01/2015 0957   ALT 23 03/17/2015 1425   ALKPHOS 65 09/01/2015 0957   ALKPHOS 44 03/17/2015 1425   BILITOT 0.5 09/01/2015 0957   BILITOT 0.6 03/17/2015 1425   GFRNONAA >60 12/03/2015 0820   GFRNONAA >60 03/17/2015 1425   GFRNONAA >60 12/09/2014 0431   GFRAA >60 12/03/2015 0820   GFRAA >60 03/17/2015 1425   GFRAA >60 12/09/2014 0431    No results found for: SPEP, UPEP  Lab Results  Component Value Date   WBC 9.2 12/03/2015   NEUTROABS 6.8* 12/03/2015   HGB 10.4* 12/03/2015   HCT 30.8* 12/03/2015   MCV 89.8 12/03/2015   PLT PENDING 12/03/2015      Chemistry      Component Value Date/Time   NA 134* 12/03/2015 0820   NA 131* 03/17/2015 1425   K 3.4* 12/03/2015 0820   K 3.9 03/17/2015 1425   CL 105 12/03/2015 0820   CL 101 03/17/2015  1425   CO2 26 12/03/2015 0820   CO2 26 03/17/2015 1425   BUN 20 12/03/2015 0820   BUN 17 03/17/2015 1425   CREATININE 0.84 12/03/2015 0820   CREATININE 0.90 03/17/2015 1425      Component Value Date/Time   CALCIUM 8.6* 12/03/2015 0820   CALCIUM 8.5* 03/17/2015 1425   ALKPHOS 65 09/01/2015 0957   ALKPHOS 44 03/17/2015 1425   AST 28 09/01/2015 0957   AST 25 03/17/2015 1425   ALT 21 09/01/2015 0957  ALT 23 03/17/2015 1425   BILITOT 0.5 09/01/2015 0957   BILITOT 0.6 03/17/2015 1425       RADIOGRAPHIC STUDIES: I have personally reviewed the radiological images as listed and agreed with the findings in the report. No results found.   ASSESSMENT & PLAN:   # ITP- steroid responsive; relapsed currently on  Prednisone 40 mg for the last 3 days.  Platelets today-7;  Most recent platelets approximately 2 days ago was 27.  Recommend going up on the prednisone to 80 mg once a day  Until next blood check on  January 17th.   #  After lengthy discussion patient agreed to proceed with Rituxan infusion. He'll have the first infusion today. Discussed the potential side effects including but not limited to infusion reaction  Reactivation of shingles;  Infection etc.  # Autoimmune hemolytic anemia- hemoglobin stable at 11. Monitor for now.  #  Nosebleeds a combination of ITP/ question   Patient will follow-up with me  With his third infusion of Rituxan on Tuesday the 31st.  For now we'll monitor his  CBC on a Tuesdays and Fridays.   # 25 minutes face-to-face with the patient discussing the above plan of care; more than 50% of time spent on prognosis/ natural history; counseling and coordination.     [name], MD 12/03/2015 9:01 AM

## 2015-12-07 ENCOUNTER — Telehealth: Payer: Self-pay | Admitting: *Deleted

## 2015-12-07 ENCOUNTER — Inpatient Hospital Stay: Payer: PPO

## 2015-12-07 DIAGNOSIS — D693 Immune thrombocytopenic purpura: Secondary | ICD-10-CM

## 2015-12-07 LAB — CBC WITH DIFFERENTIAL/PLATELET
BASOS ABS: 0 10*3/uL (ref 0–0.1)
Basophils Relative: 0 %
Eosinophils Absolute: 0 10*3/uL (ref 0–0.7)
HCT: 32.9 % — ABNORMAL LOW (ref 40.0–52.0)
Hemoglobin: 11 g/dL — ABNORMAL LOW (ref 13.0–18.0)
Lymphs Abs: 1 10*3/uL (ref 1.0–3.6)
MCH: 30.1 pg (ref 26.0–34.0)
MCHC: 33.4 g/dL (ref 32.0–36.0)
MCV: 90.1 fL (ref 80.0–100.0)
Monocytes Absolute: 0.7 10*3/uL (ref 0.2–1.0)
Monocytes Relative: 7 %
Neutro Abs: 8.3 10*3/uL — ABNORMAL HIGH (ref 1.4–6.5)
Neutrophils Relative %: 83 %
PLATELETS: 16 10*3/uL — AB (ref 150–400)
RBC: 3.66 MIL/uL — AB (ref 4.40–5.90)
RDW: 15.3 % — ABNORMAL HIGH (ref 11.5–14.5)
WBC: 10.1 10*3/uL (ref 3.8–10.6)

## 2015-12-07 MED ORDER — PREDNISONE 20 MG PO TABS: 20.0000 mg | ORAL_TABLET | Freq: Four times a day (QID) | ORAL | Status: DC

## 2015-12-07 NOTE — Telephone Encounter (Signed)
1105 am--Critical value called to [facility] by [facility] in lab. PLT count is 16.  Read back process performed with [facility]. Dr. Rogue Bussing informed at 1110. MD recommended that patient continue on prednisone 80 mg daily. Labs will be checked again on Friday 12/10/15.  The patient was given these instructions via Doni, RN in [place]. Teach back process performed with patient. Per Doni, pt needs a RF on prednisone. Verbal order obtained to refill prednisone.

## 2015-12-10 ENCOUNTER — Telehealth: Payer: Self-pay | Admitting: *Deleted

## 2015-12-10 ENCOUNTER — Telehealth: Payer: Self-pay | Admitting: Internal Medicine

## 2015-12-10 ENCOUNTER — Inpatient Hospital Stay: Payer: PPO

## 2015-12-10 DIAGNOSIS — D693 Immune thrombocytopenic purpura: Secondary | ICD-10-CM

## 2015-12-10 LAB — CBC WITH DIFFERENTIAL/PLATELET
Basophils Absolute: 0 10*3/uL (ref 0–0.1)
Basophils Relative: 0 %
Eosinophils Absolute: 0 10*3/uL (ref 0–0.7)
Eosinophils Relative: 0 %
HCT: 34.4 % — ABNORMAL LOW (ref 40.0–52.0)
Hemoglobin: 11.5 g/dL — ABNORMAL LOW (ref 13.0–18.0)
Lymphocytes Relative: 13 %
Lymphs Abs: 1.2 10*3/uL (ref 1.0–3.6)
MCH: 30.2 pg (ref 26.0–34.0)
MCHC: 33.4 g/dL (ref 32.0–36.0)
MCV: 90.2 fL (ref 80.0–100.0)
Monocytes Absolute: 0.7 10*3/uL (ref 0.2–1.0)
Monocytes Relative: 8 %
Neutro Abs: 7.4 10*3/uL — ABNORMAL HIGH (ref 1.4–6.5)
Neutrophils Relative %: 79 %
Platelets: 59 10*3/uL — ABNORMAL LOW (ref 150–440)
RBC: 3.82 MIL/uL — ABNORMAL LOW (ref 4.40–5.90)
RDW: 15 % — ABNORMAL HIGH (ref 11.5–14.5)
WBC: 9.4 10*3/uL (ref 3.8–10.6)

## 2015-12-10 NOTE — Telephone Encounter (Signed)
Called patient and spoke with spouse to let her know patients platelets are 57.  MD recommending patient decrease his prednisone to 40 mg once a day for the next 3 days and then decrease to 20 mg once a day.  Spouse stated that patient has appointment on Tuesday 12-14-15 for lab and second Rituxan infusion. She also read instructions back to be verbalizing understanding.

## 2015-12-10 NOTE — Telephone Encounter (Signed)
Called Mrs. Barcomb back to inform her that MD wants patient to stay on 40 mg prednisone until his appointment on Tuesday, at which time he will reassess. Mrs. Wibbenmeyer verbalized understanding.

## 2015-12-10 NOTE — Telephone Encounter (Signed)
Please inform patient- that his platelets are 57; recommend cutting down the prednisone to 40 mg once a day for the next 3 days; and then cutting down to 20 mg once a day- until next Friday; also order CBC for next Friday/ patient i think it is also due for Rituxan on the same day.

## 2015-12-11 LAB — HEPATITIS B CORE ANTIBODY, IGM: HEP B C IGM: NEGATIVE

## 2015-12-13 ENCOUNTER — Ambulatory Visit: Payer: PPO | Admitting: Internal Medicine

## 2015-12-13 ENCOUNTER — Other Ambulatory Visit: Payer: PPO

## 2015-12-14 ENCOUNTER — Inpatient Hospital Stay: Payer: PPO

## 2015-12-14 ENCOUNTER — Other Ambulatory Visit: Payer: Self-pay | Admitting: Internal Medicine

## 2015-12-14 VITALS — BP 150/75 | HR 64 | Temp 96.9°F | Resp 18

## 2015-12-14 DIAGNOSIS — D693 Immune thrombocytopenic purpura: Secondary | ICD-10-CM

## 2015-12-14 LAB — CBC WITH DIFFERENTIAL/PLATELET
BASOS PCT: 0 %
Basophils Absolute: 0 10*3/uL (ref 0–0.1)
EOS ABS: 0 10*3/uL (ref 0–0.7)
Eosinophils Relative: 0 %
HCT: 33.9 % — ABNORMAL LOW (ref 40.0–52.0)
HEMOGLOBIN: 11.2 g/dL — AB (ref 13.0–18.0)
Lymphocytes Relative: 10 %
Lymphs Abs: 1.2 10*3/uL (ref 1.0–3.6)
MCH: 29.9 pg (ref 26.0–34.0)
MCHC: 33 g/dL (ref 32.0–36.0)
MCV: 90.5 fL (ref 80.0–100.0)
Monocytes Absolute: 1 10*3/uL (ref 0.2–1.0)
Monocytes Relative: 8 %
NEUTROS PCT: 82 %
Neutro Abs: 9.9 10*3/uL — ABNORMAL HIGH (ref 1.4–6.5)
Platelets: 54 10*3/uL — ABNORMAL LOW (ref 150–440)
RBC: 3.75 MIL/uL — AB (ref 4.40–5.90)
RDW: 15 % — ABNORMAL HIGH (ref 11.5–14.5)
WBC: 12.2 10*3/uL — AB (ref 3.8–10.6)

## 2015-12-14 MED ORDER — DIPHENHYDRAMINE HCL 25 MG PO CAPS
50.0000 mg | ORAL_CAPSULE | Freq: Once | ORAL | Status: AC
  Administered 2015-12-14: 50 mg via ORAL
  Filled 2015-12-14: qty 2

## 2015-12-14 MED ORDER — SODIUM CHLORIDE 0.9 % IV SOLN: 375.0000 mg/m2 | Freq: Once | INTRAVENOUS | Status: DC

## 2015-12-14 MED ORDER — SODIUM CHLORIDE 0.9 % IV SOLN
375.0000 mg/m2 | Freq: Once | INTRAVENOUS | Status: AC
  Administered 2015-12-14: 700 mg via INTRAVENOUS
  Filled 2015-12-14: qty 60

## 2015-12-14 MED ORDER — SODIUM CHLORIDE 0.9 % IV SOLN
Freq: Once | INTRAVENOUS | Status: AC
  Administered 2015-12-14: 10:00:00 via INTRAVENOUS
  Filled 2015-12-14: qty 1000

## 2015-12-14 MED ORDER — ACETAMINOPHEN 325 MG PO TABS
650.0000 mg | ORAL_TABLET | Freq: Once | ORAL | Status: AC
  Administered 2015-12-14: 650 mg via ORAL
  Filled 2015-12-14: qty 2

## 2015-12-17 ENCOUNTER — Telehealth: Payer: Self-pay | Admitting: *Deleted

## 2015-12-17 ENCOUNTER — Other Ambulatory Visit: Payer: Self-pay | Admitting: Internal Medicine

## 2015-12-17 ENCOUNTER — Inpatient Hospital Stay: Payer: PPO

## 2015-12-17 DIAGNOSIS — D693 Immune thrombocytopenic purpura: Secondary | ICD-10-CM

## 2015-12-17 LAB — CBC WITH DIFFERENTIAL/PLATELET
Basophils Absolute: 0 10*3/uL (ref 0–0.1)
Basophils Relative: 0 %
Eosinophils Absolute: 0.1 10*3/uL (ref 0–0.7)
Eosinophils Relative: 1 %
HEMATOCRIT: 34.1 % — AB (ref 40.0–52.0)
Hemoglobin: 11.5 g/dL — ABNORMAL LOW (ref 13.0–18.0)
LYMPHS ABS: 1.6 10*3/uL (ref 1.0–3.6)
MCH: 30.8 pg (ref 26.0–34.0)
MCHC: 33.6 g/dL (ref 32.0–36.0)
MCV: 91.7 fL (ref 80.0–100.0)
MONO ABS: 0.7 10*3/uL (ref 0.2–1.0)
NEUTROS ABS: 6.8 10*3/uL — AB (ref 1.4–6.5)
Neutrophils Relative %: 74 %
PLATELETS: 17 10*3/uL — AB (ref 150–440)
RBC: 3.72 MIL/uL — ABNORMAL LOW (ref 4.40–5.90)
RDW: 14.6 % — AB (ref 11.5–14.5)
WBC: 9.2 10*3/uL (ref 3.8–10.6)

## 2015-12-17 NOTE — Telephone Encounter (Signed)
Wife called back to clarify prednisone dose. Teach back process performed. Prednisone 60 mg daily.

## 2015-12-17 NOTE — Telephone Encounter (Signed)
10 am- [facility] called spoke with Angie Fava, RN. Critical value of plt count called to RN. plt count  17. Read back process performed with tech. Dr. Rogue Bussing notified at 1005 am of critical value. Read back process performed with md.  Per Dr. Rogue Bussing, md recommended that patient start taking 60 mg of prednisone daily. We will recheck his labs at the next visit on Tuesday 12/21/15.  Informed md that patient is scheduled for lab/md/Rituxan treatment on Tuesday 12/21/15. MD will not be in [place] office on this day. Per MD, ok to change the schedule for lab/rituxan only. MD would like to change the date of the next lab/md/treatment for 12/28/15 to 12/29/15.  An additional apt would be needed for lab only on 12/24/15. This order was sent to scheduling to arrange as well as an order for. Pt's wife is aware of the new appointments. She gave verbal understanding.  Teach back process performed with patient and wife with new apts and new prednisone dosing.  Approximately 15 minutes spent in discussion with the patient and his wife.  The patient also desires a 2nd opinion for his ITP at [facility]. I spoke with Dr. Tish Men, who is aware of this request. I will coordinate this 2nd opinion appointment for the patient.

## 2015-12-21 ENCOUNTER — Other Ambulatory Visit: Payer: Self-pay | Admitting: Internal Medicine

## 2015-12-21 ENCOUNTER — Telehealth: Payer: Self-pay | Admitting: *Deleted

## 2015-12-21 ENCOUNTER — Inpatient Hospital Stay: Payer: PPO | Admitting: Internal Medicine

## 2015-12-21 ENCOUNTER — Inpatient Hospital Stay: Payer: PPO

## 2015-12-21 VITALS — BP 132/67 | HR 61 | Temp 98.2°F | Resp 18

## 2015-12-21 DIAGNOSIS — D693 Immune thrombocytopenic purpura: Secondary | ICD-10-CM | POA: Diagnosis not present

## 2015-12-21 LAB — CBC WITH DIFFERENTIAL/PLATELET
Basophils Absolute: 0 10*3/uL (ref 0–0.1)
Basophils Relative: 0 %
Eosinophils Absolute: 0 10*3/uL (ref 0–0.7)
HCT: 34.6 % — ABNORMAL LOW (ref 40.0–52.0)
HEMOGLOBIN: 11.4 g/dL — AB (ref 13.0–18.0)
LYMPHS ABS: 0.6 10*3/uL — AB (ref 1.0–3.6)
MCH: 30.3 pg (ref 26.0–34.0)
MCHC: 33 g/dL (ref 32.0–36.0)
MCV: 91.7 fL (ref 80.0–100.0)
Monocytes Absolute: 0.7 10*3/uL (ref 0.2–1.0)
Neutro Abs: 13.7 10*3/uL — ABNORMAL HIGH (ref 1.4–6.5)
Neutrophils Relative %: 91 %
PLATELETS: 13 10*3/uL — AB (ref 150–440)
RBC: 3.77 MIL/uL — AB (ref 4.40–5.90)
RDW: 15.2 % — ABNORMAL HIGH (ref 11.5–14.5)
WBC: 14.9 10*3/uL — AB (ref 3.8–10.6)

## 2015-12-21 MED ORDER — ACETAMINOPHEN 325 MG PO TABS
650.0000 mg | ORAL_TABLET | Freq: Once | ORAL | Status: AC
  Administered 2015-12-21: 650 mg via ORAL
  Filled 2015-12-21: qty 2

## 2015-12-21 MED ORDER — SODIUM CHLORIDE 0.9 % IV SOLN
700.0000 mg | Freq: Once | INTRAVENOUS | Status: AC
  Administered 2015-12-21: 700 mg via INTRAVENOUS
  Filled 2015-12-21: qty 60

## 2015-12-21 MED ORDER — SODIUM CHLORIDE 0.9 % IV SOLN
Freq: Once | INTRAVENOUS | Status: AC
  Administered 2015-12-21: 10:00:00 via INTRAVENOUS
  Filled 2015-12-21: qty 1000

## 2015-12-21 MED ORDER — DIPHENHYDRAMINE HCL 25 MG PO CAPS
50.0000 mg | ORAL_CAPSULE | Freq: Once | ORAL | Status: AC
  Administered 2015-12-21: 50 mg via ORAL
  Filled 2015-12-21: qty 2

## 2015-12-21 MED ORDER — SODIUM CHLORIDE 0.9 % IV SOLN: 375.0000 mg/m2 | Freq: Once | INTRAVENOUS | Status: DC

## 2015-12-21 NOTE — Telephone Encounter (Signed)
Collene Mares, lab tech contacted [facility] and spoke with Hoy Register, RN at 941. Critical value. Plt count 13. Read back process performed by RN with lab tech. Dr. Rogue Bussing made aware at 945. Read back process performed with md.  Nira Conn RN contacted Haywood Pao, RN charge nurse in the infusion suite of cancer center. Kearney Hard, RN to provide pt instructions for new dosing of prednisone. Per Dr. Rogue Bussing, pt needs to take 80 mg of Prednisone per day. MD would like to see patient on Friday 12/24/15. Maudie Mercury will let the patient know.

## 2015-12-23 ENCOUNTER — Encounter: Payer: Self-pay | Admitting: *Deleted

## 2015-12-23 ENCOUNTER — Other Ambulatory Visit: Payer: Self-pay | Admitting: *Deleted

## 2015-12-23 DIAGNOSIS — D693 Immune thrombocytopenic purpura: Secondary | ICD-10-CM

## 2015-12-23 NOTE — Progress Notes (Signed)
[name] is scheduled to see Dr Nelia Shi on 01/04/2016 @ 12pm.

## 2015-12-23 NOTE — Progress Notes (Signed)
Order entered and msg sent to cancer center scheduling to iniate a referral to [facility] hematology for 2nd opinion for ITP. Asked Schedulers to contact [facility] at  [phone] to arrange appointments. [facility] records will send records.

## 2015-12-24 ENCOUNTER — Inpatient Hospital Stay: Payer: PPO | Admitting: Internal Medicine

## 2015-12-24 ENCOUNTER — Inpatient Hospital Stay: Payer: PPO

## 2015-12-24 ENCOUNTER — Inpatient Hospital Stay (HOSPITAL_BASED_OUTPATIENT_CLINIC_OR_DEPARTMENT_OTHER): Payer: PPO | Admitting: Internal Medicine

## 2015-12-24 ENCOUNTER — Encounter: Payer: Self-pay | Admitting: Internal Medicine

## 2015-12-24 ENCOUNTER — Inpatient Hospital Stay: Payer: PPO | Attending: Internal Medicine

## 2015-12-24 VITALS — BP 105/60 | HR 68 | Temp 98.1°F | Resp 18 | Ht 69.0 in | Wt 167.1 lb

## 2015-12-24 DIAGNOSIS — Z5111 Encounter for antineoplastic chemotherapy: Secondary | ICD-10-CM | POA: Insufficient documentation

## 2015-12-24 DIAGNOSIS — D693 Immune thrombocytopenic purpura: Secondary | ICD-10-CM | POA: Diagnosis not present

## 2015-12-24 DIAGNOSIS — E7800 Pure hypercholesterolemia, unspecified: Secondary | ICD-10-CM | POA: Diagnosis not present

## 2015-12-24 DIAGNOSIS — Z79899 Other long term (current) drug therapy: Secondary | ICD-10-CM | POA: Diagnosis not present

## 2015-12-24 DIAGNOSIS — I10 Essential (primary) hypertension: Secondary | ICD-10-CM | POA: Diagnosis not present

## 2015-12-24 DIAGNOSIS — D591 Other autoimmune hemolytic anemias: Secondary | ICD-10-CM | POA: Insufficient documentation

## 2015-12-24 DIAGNOSIS — Z8546 Personal history of malignant neoplasm of prostate: Secondary | ICD-10-CM | POA: Diagnosis not present

## 2015-12-24 LAB — CBC WITH DIFFERENTIAL/PLATELET
BASOS ABS: 0 10*3/uL (ref 0–0.1)
Basophils Relative: 0 %
EOS ABS: 0 10*3/uL (ref 0–0.7)
Eosinophils Relative: 0 %
HCT: 35.1 % — ABNORMAL LOW (ref 40.0–52.0)
HEMOGLOBIN: 11.7 g/dL — AB (ref 13.0–18.0)
LYMPHS ABS: 1.4 10*3/uL (ref 1.0–3.6)
Lymphocytes Relative: 12 %
MCH: 30.2 pg (ref 26.0–34.0)
MCHC: 33.2 g/dL (ref 32.0–36.0)
MCV: 91 fL (ref 80.0–100.0)
Monocytes Absolute: 0.8 10*3/uL (ref 0.2–1.0)
Monocytes Relative: 7 %
NEUTROS PCT: 81 %
Neutro Abs: 9.6 10*3/uL — ABNORMAL HIGH (ref 1.4–6.5)
Platelets: 34 10*3/uL — ABNORMAL LOW (ref 150–440)
RBC: 3.85 MIL/uL — AB (ref 4.40–5.90)
RDW: 15 % — ABNORMAL HIGH (ref 11.5–14.5)
WBC: 11.9 10*3/uL — AB (ref 3.8–10.6)

## 2015-12-24 NOTE — Progress Notes (Signed)
Pt here for check up with plt dropping and on rituxan.  They are getting sec opinion at [facility]. No bleeding noted

## 2015-12-24 NOTE — Progress Notes (Signed)
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 Patient is also on rituximab IV times weekly 3.   The patient is currently on prednisone80 mg for the last 3 days; when his platelets dropped to 17 when he was tapered to 40 mg a day.  He denies any weakness in his muscles; denies any swelling in the legs. Denies any difficulty swallowing.   He has not had nosebleeds in the interim. Patient is active and continues to exercise on his bike.   REVIEW OF SYSTEMS:  A complete 10 point review of system is done which is negative except mentioned above/history of present illness.   PAST MEDICAL HISTORY :  Past Medical History  Diagnosis Date  . Shingles   . Dehydration   . Hypertension   . Prostate cancer ([place])   . Hypercholesteremia   . 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 Insomnia   . Leukocytosis   . Autoimmune hemolytic anemia (HCC)   . Detached retina     PAST SURGICAL HISTORY :   Past Surgical History  Procedure Laterality Date  . Hernia repair    . Eye surgery    . Artificial left eye    . Joint replacement    . Prostate surgery      FAMILY HISTORY :   Family History  Problem Relation Age of Onset  . Cancer Mother   . Multiple sclerosis Father     SOCIAL HISTORY:   Social History  Substance Use Topics  . Smoking status: Never Smoker   . Smokeless tobacco: Not on file  . Alcohol Use: No    ALLERGIES:  is allergic to cephalexin and hydrocodone-acetaminophen.  MEDICATIONS:  Current  Outpatient Prescriptions  Medication Sig Dispense Refill  . acetaminophen (TYLENOL) 500 MG tablet Take 500 mg by mouth every 6 (six) hours as needed for mild pain.    . citalopram (CELEXA) 10 MG tablet Take 10 mg by mouth daily.    . Cyanocobalamin (RA VITAMIN B-12 TR) 1000 MCG TBCR Take 1,000 mcg by mouth daily.    Marland Kitchen docusate sodium (COLACE) 100 MG capsule Take 1 capsule (100 mg total) by mouth 2 (two) times daily. 60 capsule 2  . feeding supplement, ENSURE ENLIVE, (ENSURE ENLIVE) LIQD Take 237 mLs by mouth 2 (two) times daily between meals. 237 mL 12  . gabapentin (NEURONTIN) 100 MG capsule Take 300 mg by mouth at bedtime. Reported on 12/03/2015    . latanoprost (XALATAN) 0.005 % ophthalmic solution Apply 1 drop to eye. At night    . lisinopril (PRINIVIL,ZESTRIL) 20 MG tablet Take 20 mg by mouth daily.    Marland Kitchen omeprazole (PRILOSEC) 40 MG capsule TAKE ONE CAPSULE DAILY USUALLY THIRTY MINUTES BEFORE BREAKFAST 30 capsule 2  . polyethylene glycol (MIRALAX / GLYCOLAX) packet Take 17 g by mouth daily as needed for mild constipation. 14 each 0  . pravastatin (PRAVACHOL) 20 MG tablet Take 40 mg by mouth at bedtime.     . predniSONE (DELTASONE) 20 MG tablet Take 1 tablet (20  mg total) by mouth 4 (four) times daily. (Take 4 tablets (20 mg each) by mouth daily-total of 80 mg) 120 tablet 6  . zolpidem (AMBIEN) 5 MG tablet TAKE ONE TABLET AT BEDTIME AS NEEDED FOR SLEEPLESSNESS 30 tablet 1   No current facility-administered medications for this visit.    PHYSICAL EXAMINATION:   BP 105/60 mmHg  Pulse 68  Temp(Src) 98.1 F (36.7 C) (Tympanic)  Resp 18  Ht 5\' 9"  (1.753 m)  Wt 167 lb 1.7 oz (75.8 kg)  BMI 24.67 kg/m2  Filed Weights   12/24/15 1119  Weight: 167 lb 1.7 oz (75.8 kg)    GENERAL: Well-nourished well-developed; Alert, no distress and comfortable.  Accompanied by his wife. EYES: no pallor or icterus OROPHARYNX: no thrush or ulceration; good dentition no bleeding noted. NECK: supple, no  masses felt LYMPH:  no palpable lymphadenopathy in the cervical, axillary or inguinal regions LUNGS: clear to auscultation and  No wheeze or crackles HEART/CVS: regular rate & rhythm and no murmurs; No lower extremity edema ABDOMEN:abdomen soft, non-tender and normal bowel sounds Musculoskeletal:no cyanosis of digits and no clubbing  PSYCH: alert & oriented x 3 with fluent speech NEURO: no focal motor/sensory deficits SKIN:  no rashes or significant lesions  LABORATORY DATA:  I have reviewed the data as listed    Component Value Date/Time   NA 134* 12/03/2015 0820   NA 131* 03/17/2015 1425   K 3.4* 12/03/2015 0820   K 3.9 03/17/2015 1425   CL 105 12/03/2015 0820   CL 101 03/17/2015 1425   CO2 26 12/03/2015 0820   CO2 26 03/17/2015 1425   GLUCOSE 126* 12/03/2015 0820   GLUCOSE 161* 03/17/2015 1425   BUN 20 12/03/2015 0820   BUN 17 03/17/2015 1425   CREATININE 0.84 12/03/2015 0820   CREATININE 0.90 03/17/2015 1425   CALCIUM 8.6* 12/03/2015 0820   CALCIUM 8.5* 03/17/2015 1425   PROT 6.9 09/01/2015 0957   PROT 6.7 03/17/2015 1425   ALBUMIN 3.9 09/01/2015 0957   ALBUMIN 3.7 03/17/2015 1425   AST 28 09/01/2015 0957   AST 25 03/17/2015 1425   ALT 21 09/01/2015 0957   ALT 23 03/17/2015 1425   ALKPHOS 65 09/01/2015 0957   ALKPHOS 44 03/17/2015 1425   BILITOT 0.5 09/01/2015 0957   BILITOT 0.6 03/17/2015 1425   GFRNONAA >60 12/03/2015 0820   GFRNONAA >60 03/17/2015 1425   GFRNONAA >60 12/09/2014 0431   GFRAA >60 12/03/2015 0820   GFRAA >60 03/17/2015 1425   GFRAA >60 12/09/2014 0431    No results found for: SPEP, UPEP  Lab Results  Component Value Date   WBC 11.9* 12/24/2015   NEUTROABS 9.6* 12/24/2015   HGB 11.7* 12/24/2015   HCT 35.1* 12/24/2015   MCV 91.0 12/24/2015   PLT 34* 12/24/2015      Chemistry      Component Value Date/Time   NA 134* 12/03/2015 0820   NA 131* 03/17/2015 1425   K 3.4* 12/03/2015 0820   K 3.9 03/17/2015 1425   CL 105 12/03/2015 0820    CL 101 03/17/2015 1425   CO2 26 12/03/2015 0820   CO2 26 03/17/2015 1425   BUN 20 12/03/2015 0820   BUN 17 03/17/2015 1425   CREATININE 0.84 12/03/2015 0820   CREATININE 0.90 03/17/2015 1425      Component Value Date/Time   CALCIUM 8.6* 12/03/2015 0820   CALCIUM 8.5* 03/17/2015 1425   ALKPHOS 65 09/01/2015 0957   ALKPHOS 44 03/17/2015 1425  AST 28 09/01/2015 0957   AST 25 03/17/2015 1425   ALT 21 09/01/2015 0957   ALT 23 03/17/2015 1425   BILITOT 0.5 09/01/2015 0957   BILITOT 0.6 03/17/2015 1425       RADIOGRAPHIC STUDIES: I have personally reviewed the radiological images as listed and agreed with the findings in the report. No results found.   ASSESSMENT & PLAN:   # ITP- steroid responsive; relapsed currently on  Prednisone 80 mg for the last 3 days.The platelets are up from 17 to currently 34 Patient is also status post Rituxan infusion weekly 3. I recommend continuing the current dose of prednisone- until he sees me February 8. He will also be due for his cycle #4 of Rituxan on the same day. I discussed with the patient that Rituxan might take few Weeks to many weeks to kick in.   # I again reviewed the treatment options- including immunoglobulins; Promacta/N-plate; and also splenectomy [least preferred option]  # Autoimmune hemolytic anemia- hemoglobin stable at 11. Monitor for now.  # patient has an appointment in [place] for second opinion on the February 14th; I recommend that he keeps it appointment. I have given my card; and asked them to have the physician and to call me if they recommend any changes to his treatment.   # I will see him back on February 8/CBC/last infusional rituxan.   # 15 minutes face-to-face with the patient discussing the above plan of care; more than 50% of time spent on prognosis/ natural history; counseling and coordination.     [name], MD 12/24/2015 11:47 AM

## 2015-12-28 ENCOUNTER — Ambulatory Visit: Payer: PPO

## 2015-12-28 ENCOUNTER — Inpatient Hospital Stay: Payer: PPO

## 2015-12-29 ENCOUNTER — Inpatient Hospital Stay (HOSPITAL_BASED_OUTPATIENT_CLINIC_OR_DEPARTMENT_OTHER): Payer: PPO | Admitting: Internal Medicine

## 2015-12-29 ENCOUNTER — Other Ambulatory Visit: Payer: Self-pay | Admitting: *Deleted

## 2015-12-29 ENCOUNTER — Inpatient Hospital Stay: Payer: PPO

## 2015-12-29 VITALS — BP 126/63 | HR 56 | Resp 18

## 2015-12-29 VITALS — BP 140/72 | HR 64 | Temp 97.5°F | Resp 18 | Ht 69.0 in | Wt 169.5 lb

## 2015-12-29 DIAGNOSIS — Z8546 Personal history of malignant neoplasm of prostate: Secondary | ICD-10-CM

## 2015-12-29 DIAGNOSIS — Z79899 Other long term (current) drug therapy: Secondary | ICD-10-CM

## 2015-12-29 DIAGNOSIS — D693 Immune thrombocytopenic purpura: Secondary | ICD-10-CM

## 2015-12-29 DIAGNOSIS — D591 Other autoimmune hemolytic anemias: Secondary | ICD-10-CM

## 2015-12-29 DIAGNOSIS — Z5111 Encounter for antineoplastic chemotherapy: Secondary | ICD-10-CM | POA: Diagnosis not present

## 2015-12-29 LAB — CBC WITH DIFFERENTIAL/PLATELET
BASOS PCT: 0 %
Basophils Absolute: 0 10*3/uL (ref 0–0.1)
Eosinophils Absolute: 0.1 10*3/uL (ref 0–0.7)
Eosinophils Relative: 1 %
HEMATOCRIT: 33.5 % — AB (ref 40.0–52.0)
Hemoglobin: 11.4 g/dL — ABNORMAL LOW (ref 13.0–18.0)
LYMPHS ABS: 1.4 10*3/uL (ref 1.0–3.6)
Lymphocytes Relative: 18 %
MCH: 30.9 pg (ref 26.0–34.0)
MCHC: 33.9 g/dL (ref 32.0–36.0)
MCV: 91 fL (ref 80.0–100.0)
MONO ABS: 0.6 10*3/uL (ref 0.2–1.0)
MONOS PCT: 7 %
NEUTROS ABS: 5.5 10*3/uL (ref 1.4–6.5)
Neutrophils Relative %: 74 %
Platelets: 80 10*3/uL — ABNORMAL LOW (ref 150–440)
RBC: 3.68 MIL/uL — ABNORMAL LOW (ref 4.40–5.90)
RDW: 15.5 % — AB (ref 11.5–14.5)
WBC: 7.5 10*3/uL (ref 3.8–10.6)

## 2015-12-29 MED ORDER — ELTROMBOPAG OLAMINE 75 MG PO TABS: 75.0000 mg | ORAL_TABLET | Freq: Every day | ORAL | Status: DC

## 2015-12-29 MED ORDER — DIPHENHYDRAMINE HCL 25 MG PO CAPS
50.0000 mg | ORAL_CAPSULE | Freq: Once | ORAL | Status: AC
  Administered 2015-12-29: 50 mg via ORAL
  Filled 2015-12-29: qty 2

## 2015-12-29 MED ORDER — POLYETHYLENE GLYCOL 3350 17 G PO PACK: 17.0000 g | PACK | Freq: Every day | ORAL | Status: DC | PRN

## 2015-12-29 MED ORDER — ACETAMINOPHEN 325 MG PO TABS
650.0000 mg | ORAL_TABLET | Freq: Once | ORAL | Status: AC
  Administered 2015-12-29: 650 mg via ORAL
  Filled 2015-12-29: qty 2

## 2015-12-29 MED ORDER — SODIUM CHLORIDE 0.9 % IV SOLN
375.0000 mg/m2 | Freq: Once | INTRAVENOUS | Status: AC
  Administered 2015-12-29: 700 mg via INTRAVENOUS
  Filled 2015-12-29: qty 60

## 2015-12-29 MED ORDER — SODIUM CHLORIDE 0.9 % IV SOLN
Freq: Once | INTRAVENOUS | Status: AC
Start: 2015-12-29 — End: 2015-12-29
  Administered 2015-12-29: 10:00:00 via INTRAVENOUS
  Filled 2015-12-29: qty 1000

## 2015-12-29 MED ORDER — RITUXIMAB CHEMO INJECTION 500 MG/50ML
375.0000 mg/m2 | Freq: Once | INTRAVENOUS | Status: DC
Start: 2015-12-29 — End: 2015-12-29

## 2015-12-29 NOTE — Progress Notes (Signed)
[place] OFFICE PROGRESS NOTE  Patient Care Team: Derinda Late, MD as PCP - General (Family Medicine)   SUMMARY OF ONCOLOGIC HISTORY:  #FEB 2016-  ITP & AUTOIMMUNE HEMOLYTIC ANEMIA s/p Prednisone; OCT 2016- Relapse of ITP- Prednisone; JAN 13th- START RITUXAN q W x4  # Hx of nose bleeds  INTERVAL HISTORY:  [name] with above history of autoimmune hemolytic anemia along with ITP- currently relapsed in October 2016 on prednisone is here for follow-up. Patient is also on rituximab IV times weekly 3.   The patient is currently on prednisone 80 mg for the last 7 days; when his platelets dropped to 13 when he was tapered to 40 mg a day.  He denies any nosebleeds. Physically active; he was [place] the golf [place] yesterday. No muscle weakness.  He denies any weakness in his muscles; denies any swelling in the legs. Denies any difficulty swallowing. No pain with swallowing.   REVIEW OF SYSTEMS:  A complete 10 point review of system is done which is negative except mentioned above/history of present illness.   PAST MEDICAL HISTORY :  Past Medical History  Diagnosis Date  . Shingles   . Dehydration   . Hypertension   . Prostate cancer ([place])   . Hypercholesteremia   . 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 Insomnia   . Leukocytosis   . Autoimmune hemolytic anemia (HCC)   . Detached retina     PAST SURGICAL HISTORY :   Past Surgical History  Procedure Laterality Date  . Hernia repair    . Eye surgery    . Artificial left eye    . Joint replacement    . Prostate surgery      FAMILY HISTORY :   Family History  Problem Relation Age of Onset  . Cancer Mother   . Multiple sclerosis Father     SOCIAL HISTORY:   Social History  Substance Use Topics  . Smoking status: Never Smoker   . Smokeless tobacco: Not on file  . Alcohol Use: No    ALLERGIES:  is allergic to cephalexin and  hydrocodone-acetaminophen.  MEDICATIONS:  Current Outpatient Prescriptions  Medication Sig Dispense Refill  . acetaminophen (TYLENOL) 500 MG tablet Take 500 mg by mouth every 6 (six) hours as needed for mild pain.    . citalopram (CELEXA) 10 MG tablet Take 10 mg by mouth daily.    . Cyanocobalamin (RA VITAMIN B-12 TR) 1000 MCG TBCR Take 1,000 mcg by mouth daily.    Marland Kitchen docusate sodium (COLACE) 100 MG capsule Take 1 capsule (100 mg total) by mouth 2 (two) times daily. 60 capsule 2  . feeding supplement, ENSURE ENLIVE, (ENSURE ENLIVE) LIQD Take 237 mLs by mouth 2 (two) times daily between meals. 237 mL 12  . gabapentin (NEURONTIN) 100 MG capsule Take 300 mg by mouth at bedtime. Reported on 12/03/2015    . latanoprost (XALATAN) 0.005 % ophthalmic solution Apply 1 drop to eye. At night    . lisinopril (PRINIVIL,ZESTRIL) 20 MG tablet Take 20 mg by mouth daily.    Marland Kitchen omeprazole (PRILOSEC) 40 MG capsule TAKE ONE CAPSULE DAILY USUALLY THIRTY MINUTES BEFORE BREAKFAST 30 capsule 2  . polyethylene glycol (MIRALAX / GLYCOLAX) packet Take 17 g by mouth daily as needed for mild constipation. 14 each 0  . pravastatin (PRAVACHOL) 20 MG tablet Take 40 mg by mouth at bedtime.     . predniSONE (DELTASONE) 20 MG tablet Take 1  tablet (20 mg total) by mouth 4 (four) times daily. (Take 4 tablets (20 mg each) by mouth daily-total of 80 mg) 120 tablet 6  . zolpidem (AMBIEN) 5 MG tablet TAKE ONE TABLET AT BEDTIME AS NEEDED FOR SLEEPLESSNESS 30 tablet 1   No current facility-administered medications for this visit.    PHYSICAL EXAMINATION:   BP 140/72 mmHg  Pulse 64  Temp(Src) 97.5 F (36.4 C) (Tympanic)  Resp 18  Ht 5\' 9"  (1.753 m)  Wt 169 lb 8.5 oz (76.9 kg)  BMI 25.02 kg/m2  Filed Weights   12/29/15 0843  Weight: 169 lb 8.5 oz (76.9 kg)    GENERAL: Well-nourished well-developed; Alert, no distress and comfortable.  Accompanied by his wife. EYES: no pallor or icterus OROPHARYNX: no thrush or ulceration;  good dentition no bleeding noted. NECK: supple, no masses felt LYMPH:  no palpable lymphadenopathy in the cervical, axillary or inguinal regions LUNGS: clear to auscultation and  No wheeze or crackles HEART/CVS: regular rate & rhythm and no murmurs; No lower extremity edema ABDOMEN:abdomen soft, non-tender and normal bowel sounds Musculoskeletal:no cyanosis of digits and no clubbing  PSYCH: alert & oriented x 3 with fluent speech NEURO: no focal motor/sensory deficits SKIN:  no rashes or significant lesions  LABORATORY DATA:  I have reviewed the data as listed    Component Value Date/Time   NA 134* 12/03/2015 0820   NA 131* 03/17/2015 1425   K 3.4* 12/03/2015 0820   K 3.9 03/17/2015 1425   CL 105 12/03/2015 0820   CL 101 03/17/2015 1425   CO2 26 12/03/2015 0820   CO2 26 03/17/2015 1425   GLUCOSE 126* 12/03/2015 0820   GLUCOSE 161* 03/17/2015 1425   BUN 20 12/03/2015 0820   BUN 17 03/17/2015 1425   CREATININE 0.84 12/03/2015 0820   CREATININE 0.90 03/17/2015 1425   CALCIUM 8.6* 12/03/2015 0820   CALCIUM 8.5* 03/17/2015 1425   PROT 6.9 09/01/2015 0957   PROT 6.7 03/17/2015 1425   ALBUMIN 3.9 09/01/2015 0957   ALBUMIN 3.7 03/17/2015 1425   AST 28 09/01/2015 0957   AST 25 03/17/2015 1425   ALT 21 09/01/2015 0957   ALT 23 03/17/2015 1425   ALKPHOS 65 09/01/2015 0957   ALKPHOS 44 03/17/2015 1425   BILITOT 0.5 09/01/2015 0957   BILITOT 0.6 03/17/2015 1425   GFRNONAA >60 12/03/2015 0820   GFRNONAA >60 03/17/2015 1425   GFRNONAA >60 12/09/2014 0431   GFRAA >60 12/03/2015 0820   GFRAA >60 03/17/2015 1425   GFRAA >60 12/09/2014 0431    No results found for: SPEP, UPEP  Lab Results  Component Value Date   WBC 7.5 12/29/2015   NEUTROABS 5.5 12/29/2015   HGB 11.4* 12/29/2015   HCT 33.5* 12/29/2015   MCV 91.0 12/29/2015   PLT 80* 12/29/2015      Chemistry      Component Value Date/Time   NA 134* 12/03/2015 0820   NA 131* 03/17/2015 1425   K 3.4* 12/03/2015 0820    K 3.9 03/17/2015 1425   CL 105 12/03/2015 0820   CL 101 03/17/2015 1425   CO2 26 12/03/2015 0820   CO2 26 03/17/2015 1425   BUN 20 12/03/2015 0820   BUN 17 03/17/2015 1425   CREATININE 0.84 12/03/2015 0820   CREATININE 0.90 03/17/2015 1425      Component Value Date/Time   CALCIUM 8.6* 12/03/2015 0820   CALCIUM 8.5* 03/17/2015 1425   ALKPHOS 65 09/01/2015 0957   ALKPHOS 44  03/17/2015 1425   AST 28 09/01/2015 0957   AST 25 03/17/2015 1425   ALT 21 09/01/2015 0957   ALT 23 03/17/2015 1425   BILITOT 0.5 09/01/2015 0957   BILITOT 0.6 03/17/2015 1425       RADIOGRAPHIC STUDIES: I have personally reviewed the radiological images as listed and agreed with the findings in the report. No results found.   ASSESSMENT & PLAN:   # ITP- steroid responsive; relapsed currently on  Prednisone 80 mg for the last 7 days. Proceed with cycle #4 of Rituxan today.  # Today the platelets are 80. The Prednisone to 40 Mg Starting Today. Further Taper the Steroids Would Be Based upon his platelet counts.  # he understands that prednisone is a short bridge until Rituxan kicks in. I also discussed the role of Promacta as a bridge If the platelets do not improve in the next few weeks/or if they drop On tapering the steroids.  #I discussed the potential problems with LFTs/bone marrow fibrosis with Promacta.   # Autoimmune hemolytic anemia- hemoglobin stable at 11. Monitor for now.  # patient has an appointment in [place] for second opinion on the February 14th.  CBC- Monday Friday for the next 4 weeks. I'll see the patient back in 4 weeks or sooner as needed.  # 15 minutes face-to-face with the patient discussing the above plan of care; more than 50% of time spent on prognosis/ natural history; counseling and coordination.     [name], MD 12/29/2015 9:04 AM

## 2015-12-30 ENCOUNTER — Telehealth: Payer: Self-pay | Admitting: *Deleted

## 2015-12-30 NOTE — Telephone Encounter (Signed)
Prescription forwarded from [facility] to [employer]. Pak[place]ae from [employer] contacted cancer to review coverage determination for the promacta. This written request form was sent to the cancer center approximately 10 minutes ago but [employer] that the wrong form was faxed. Therefore, she desired perform the coverage determination via telephone. Hippa verification performed to verify pt information and dob.  Information provided: dx code and previous therapies tried and liver enzymes. This information will go to the review process to determine patient's eligibility for the drug. Fax will be sent to the cancer center for confirmation approval of authorization.

## 2015-12-31 ENCOUNTER — Telehealth: Payer: Self-pay | Admitting: *Deleted

## 2015-12-31 ENCOUNTER — Inpatient Hospital Stay: Payer: PPO

## 2015-12-31 DIAGNOSIS — Z5111 Encounter for antineoplastic chemotherapy: Secondary | ICD-10-CM | POA: Diagnosis not present

## 2015-12-31 DIAGNOSIS — D693 Immune thrombocytopenic purpura: Secondary | ICD-10-CM

## 2015-12-31 LAB — CBC WITH DIFFERENTIAL/PLATELET
BASOS ABS: 0 10*3/uL (ref 0–0.1)
BASOS PCT: 0 %
Eosinophils Absolute: 0.1 10*3/uL (ref 0–0.7)
Eosinophils Relative: 2 %
HEMATOCRIT: 35.1 % — AB (ref 40.0–52.0)
HEMOGLOBIN: 12 g/dL — AB (ref 13.0–18.0)
Lymphocytes Relative: 18 %
Lymphs Abs: 1.4 10*3/uL (ref 1.0–3.6)
MCH: 30.8 pg (ref 26.0–34.0)
MCHC: 34.1 g/dL (ref 32.0–36.0)
MCV: 90.3 fL (ref 80.0–100.0)
Monocytes Absolute: 0.6 10*3/uL (ref 0.2–1.0)
Monocytes Relative: 8 %
NEUTROS ABS: 5.6 10*3/uL (ref 1.4–6.5)
NEUTROS PCT: 72 %
PLATELETS: 82 10*3/uL — AB (ref 150–440)
RBC: 3.88 MIL/uL — AB (ref 4.40–5.90)
RDW: 15.8 % — AB (ref 11.5–14.5)
WBC: 7.7 10*3/uL (ref 3.8–10.6)

## 2015-12-31 NOTE — Telephone Encounter (Signed)
Results reviewed with patient's wife from today's labs.  plt at 82. I explained to her that Dr. Rogue Bussing was attempting to perform a prior auth on the Promacta. She states that she understands that the Promacta was denied. She was notified by [employer] today via telephone. I told her that Dr. B placed the phone call at 830 am to the insurance to expedite the appeal within 72 hours. As of 1625 pm today, the insurance has not called Dr. Rogue Bussing back. The wife gave verbal understanding. She also stated that the patient will wait on the lab results on Monday 01/03/16 so that she will know the results piror to the [facility] apt on Tuesday 01/04/16

## 2016-01-03 ENCOUNTER — Inpatient Hospital Stay: Payer: PPO

## 2016-01-03 ENCOUNTER — Telehealth: Payer: Self-pay | Admitting: *Deleted

## 2016-01-03 DIAGNOSIS — Z5111 Encounter for antineoplastic chemotherapy: Secondary | ICD-10-CM | POA: Diagnosis not present

## 2016-01-03 DIAGNOSIS — D693 Immune thrombocytopenic purpura: Secondary | ICD-10-CM

## 2016-01-03 LAB — CBC WITH DIFFERENTIAL/PLATELET
Basophils Absolute: 0 10*3/uL (ref 0–0.1)
Basophils Relative: 0 %
EOS ABS: 0.1 10*3/uL (ref 0–0.7)
Eosinophils Relative: 1 %
HEMATOCRIT: 33.8 % — AB (ref 40.0–52.0)
HEMOGLOBIN: 11.4 g/dL — AB (ref 13.0–18.0)
Lymphocytes Relative: 6 %
Lymphs Abs: 0.7 10*3/uL — ABNORMAL LOW (ref 1.0–3.6)
MCH: 30.9 pg (ref 26.0–34.0)
MCHC: 33.8 g/dL (ref 32.0–36.0)
MCV: 91.2 fL (ref 80.0–100.0)
Monocytes Absolute: 0.8 10*3/uL (ref 0.2–1.0)
Monocytes Relative: 7 %
NEUTROS ABS: 10.1 10*3/uL — AB (ref 1.4–6.5)
Neutrophils Relative %: 86 %
Platelets: 45 10*3/uL — ABNORMAL LOW (ref 150–440)
RBC: 3.71 MIL/uL — AB (ref 4.40–5.90)
RDW: 15.8 % — ABNORMAL HIGH (ref 11.5–14.5)
WBC: 11.8 10*3/uL — AB (ref 3.8–10.6)

## 2016-01-03 NOTE — Telephone Encounter (Signed)
Member id Number [id number]  Contacted [facility] to determine when MD will receive a call back from the prior auth team. According to [employer] per Carmell Austria this is still in the "appeal validation department."  She will marked this an a urgent appeal. MD would like to do an expedited peer to peer. I gave the [facility] Dr. Sharmaine Base phone number. She states that she will give the appeal team the phone number.

## 2016-01-03 NOTE — Telephone Encounter (Signed)
-----   Message from [name], MD sent at 01/03/2016 11:27 AM EST ----- Please inform pt- to continue prednisone at 40 mg a day; repeat CBC this Friday.

## 2016-01-03 NOTE — Telephone Encounter (Signed)
RN Spoke with patient's wife and patient in clinic today. Results were provided to patient. At that time, I explained to the patient that he needs to keep the same dose of the prednisone 40 mg every day. He gave verbal understanding of the medication dosing and he understands that his labs will be redrawn again on Friday. He did report a post nasal drip and a clear sputum productive cough that has occurred since Friday. He wanted to make sure that md was ok with the patient starting on over the counter Claritin or Allegra. MD states the patient can use this medication and this would not interfere with the ITP/rituxan. The patient states that he has Allegra at home. He will try this. I encouraged him to increase his po fluid intake. We discussed the benefits of using hot teas and water/fluids with mucus production. He will see is pmd if symptoms persist or worsen. Teach back process performed with patient and his wife.  I also explained that the Promacta was not yet approved. The prior auth team with [employer] has not yet called Dr. Rogue Bussing back. The patient and his wife gave verbal understanding. They have an appointment at [facility] tomorrow for a 2nd opinion for the ITP.

## 2016-01-04 DIAGNOSIS — Z9225 Personal history of immunosupression therapy: Secondary | ICD-10-CM | POA: Diagnosis not present

## 2016-01-04 DIAGNOSIS — D591 Other autoimmune hemolytic anemias: Secondary | ICD-10-CM | POA: Diagnosis not present

## 2016-01-04 DIAGNOSIS — Z862 Personal history of diseases of the blood and blood-forming organs and certain disorders involving the immune mechanism: Secondary | ICD-10-CM | POA: Diagnosis not present

## 2016-01-04 DIAGNOSIS — D693 Immune thrombocytopenic purpura: Secondary | ICD-10-CM | POA: Diagnosis not present

## 2016-01-04 DIAGNOSIS — Z7952 Long term (current) use of systemic steroids: Secondary | ICD-10-CM | POA: Diagnosis not present

## 2016-01-05 ENCOUNTER — Telehealth: Payer: Self-pay | Admitting: *Deleted

## 2016-01-05 NOTE — Telephone Encounter (Signed)
I called the patient to let him know that the promacta was approved. The Pt states that he went to [facility] yesterday for his apt. His plt count was est. 20,000. Patient states that the [facility] provider increased Prednisone 80 mg daily. Patient was told to keep his appointment with our cancer center on Friday for the lab check as scheduled. Patient started Cellcept 500 mg daily (two pill daily). He started this new drug this morning. He is scheduled to take another dose this evening. He is able to get this drug at the [facility] with a reasonable copay Patient will see the [facility] provider on February 01, 2016. He states that his [facility] hematologist will be leaving to go back to [place] on vacation tomorrow should Dr. B want to speak to her via phone. The patient states that at this time he would like to go with the [facility] treatment regimen.

## 2016-01-07 ENCOUNTER — Telehealth: Payer: Self-pay | Admitting: *Deleted

## 2016-01-07 ENCOUNTER — Inpatient Hospital Stay: Payer: PPO

## 2016-01-07 DIAGNOSIS — D693 Immune thrombocytopenic purpura: Secondary | ICD-10-CM

## 2016-01-07 DIAGNOSIS — Z5111 Encounter for antineoplastic chemotherapy: Secondary | ICD-10-CM | POA: Diagnosis not present

## 2016-01-07 LAB — CBC WITH DIFFERENTIAL/PLATELET
BASOS ABS: 0 10*3/uL (ref 0–0.1)
Eosinophils Absolute: 0 10*3/uL (ref 0–0.7)
Eosinophils Relative: 0 %
HEMATOCRIT: 31.1 % — AB (ref 40.0–52.0)
HEMOGLOBIN: 10.7 g/dL — AB (ref 13.0–18.0)
Lymphocytes Relative: 11 %
Lymphs Abs: 1 10*3/uL (ref 1.0–3.6)
MCH: 31.4 pg (ref 26.0–34.0)
MCHC: 34.3 g/dL (ref 32.0–36.0)
MCV: 91.7 fL (ref 80.0–100.0)
Monocytes Absolute: 0.5 10*3/uL (ref 0.2–1.0)
NEUTROS ABS: 7.5 10*3/uL — AB (ref 1.4–6.5)
Platelets: <5 10*3/uL — CL (ref 150–440)
RBC: 3.39 MIL/uL — AB (ref 4.40–5.90)
RDW: 15.9 % — ABNORMAL HIGH (ref 11.5–14.5)
WBC: 9 10*3/uL (ref 3.8–10.6)

## 2016-01-07 NOTE — Telephone Encounter (Signed)
[facility] called to verify to cnl the Promacta. I explained that the patient is currently on Cellcept. At this time, [facility] can cnl the promacta RX and we will evaluate the ITP with the use of cellcept.

## 2016-01-07 NOTE — Telephone Encounter (Signed)
Increase the dose of prednisone to 80 mg once a day; recheck  CBC on 20th. If any bleeding issues need to go to the emergency room.

## 2016-01-07 NOTE — Telephone Encounter (Signed)
Spoke with patient. Pt's plt count is less than 5 today. Per md, pt needs to take Prednisone 80 mg daily. The patient states that he was currently taking 60 mg not 80 mg as previously stated to RN this week. Pt advised that if he falls, or has continuous bleeding, he will go to the ED. Teach back process performed with patient and patient's wife.

## 2016-01-08 ENCOUNTER — Inpatient Hospital Stay: Payer: PPO

## 2016-01-08 ENCOUNTER — Inpatient Hospital Stay
Admission: EM | Admit: 2016-01-08 | Discharge: 2016-01-10 | DRG: 813 | Disposition: A | Discharge status: Home / Self Care | Payer: PPO | Attending: Internal Medicine | Admitting: Internal Medicine

## 2016-01-08 ENCOUNTER — Encounter: Payer: Self-pay | Admitting: Emergency Medicine

## 2016-01-08 DIAGNOSIS — F329 Major depressive disorder, single episode, unspecified: Secondary | ICD-10-CM | POA: Diagnosis present

## 2016-01-08 DIAGNOSIS — D693 Immune thrombocytopenic purpura: Principal | ICD-10-CM | POA: Diagnosis present

## 2016-01-08 DIAGNOSIS — R040 Epistaxis: Secondary | ICD-10-CM

## 2016-01-08 DIAGNOSIS — R233 Spontaneous ecchymoses: Secondary | ICD-10-CM

## 2016-01-08 DIAGNOSIS — D696 Thrombocytopenia, unspecified: Secondary | ICD-10-CM

## 2016-01-08 DIAGNOSIS — I10 Essential (primary) hypertension: Secondary | ICD-10-CM | POA: Diagnosis present

## 2016-01-08 DIAGNOSIS — H40051 Ocular hypertension, right eye: Secondary | ICD-10-CM

## 2016-01-08 DIAGNOSIS — B0229 Other postherpetic nervous system involvement: Secondary | ICD-10-CM | POA: Diagnosis present

## 2016-01-08 DIAGNOSIS — K219 Gastro-esophageal reflux disease without esophagitis: Secondary | ICD-10-CM | POA: Diagnosis present

## 2016-01-08 DIAGNOSIS — D591 Other autoimmune hemolytic anemias: Secondary | ICD-10-CM | POA: Diagnosis not present

## 2016-01-08 DIAGNOSIS — E785 Hyperlipidemia, unspecified: Secondary | ICD-10-CM | POA: Diagnosis not present

## 2016-01-08 DIAGNOSIS — B370 Candidal stomatitis: Secondary | ICD-10-CM | POA: Diagnosis present

## 2016-01-08 DIAGNOSIS — E782 Mixed hyperlipidemia: Secondary | ICD-10-CM | POA: Diagnosis not present

## 2016-01-08 DIAGNOSIS — Z8546 Personal history of malignant neoplasm of prostate: Secondary | ICD-10-CM | POA: Diagnosis not present

## 2016-01-08 DIAGNOSIS — R0981 Nasal congestion: Secondary | ICD-10-CM | POA: Diagnosis present

## 2016-01-08 DIAGNOSIS — H409 Unspecified glaucoma: Secondary | ICD-10-CM | POA: Diagnosis present

## 2016-01-08 HISTORY — DX: Epistaxis: R04.0

## 2016-01-08 LAB — CBC WITH DIFFERENTIAL/PLATELET
BASOS ABS: 0 10*3/uL (ref 0–0.1)
BASOS ABS: 0 10*3/uL (ref 0–0.1)
Eosinophils Absolute: 0 10*3/uL (ref 0–0.7)
Eosinophils Absolute: 0 10*3/uL (ref 0–0.7)
Eosinophils Relative: 0 %
HCT: 28.2 % — ABNORMAL LOW (ref 40.0–52.0)
HEMATOCRIT: 31.6 % — AB (ref 40.0–52.0)
HEMOGLOBIN: 10.7 g/dL — AB (ref 13.0–18.0)
Hemoglobin: 9.5 g/dL — ABNORMAL LOW (ref 13.0–18.0)
Lymphocytes Relative: 10 %
Lymphocytes Relative: 5 %
Lymphs Abs: 0.8 10*3/uL — ABNORMAL LOW (ref 1.0–3.6)
Lymphs Abs: 0.4 10*3/uL — ABNORMAL LOW (ref 1.0–3.6)
MCH: 31 pg (ref 26.0–34.0)
MCH: 30.8 pg (ref 26.0–34.0)
MCHC: 33.8 g/dL (ref 32.0–36.0)
MCHC: 33.8 g/dL (ref 32.0–36.0)
MCV: 91.7 fL (ref 80.0–100.0)
MCV: 91.2 fL (ref 80.0–100.0)
MONO ABS: 0.3 10*3/uL (ref 0.2–1.0)
Monocytes Absolute: 0.7 10*3/uL (ref 0.2–1.0)
Monocytes Relative: 8 %
Monocytes Relative: 3 %
NEUTROS ABS: 7.9 10*3/uL — AB (ref 1.4–6.5)
Neutro Abs: 6.7 10*3/uL — ABNORMAL HIGH (ref 1.4–6.5)
Neutrophils Relative %: 82 %
Neutrophils Relative %: 92 %
PLATELETS: 65 10*3/uL — AB (ref 150–440)
Platelets: <5 10*3/uL — CL (ref 150–440)
RBC: 3.45 MIL/uL — ABNORMAL LOW (ref 4.40–5.90)
RBC: 3.1 MIL/uL — ABNORMAL LOW (ref 4.40–5.90)
RDW: 16.2 % — AB (ref 11.5–14.5)
RDW: 16 % — AB (ref 11.5–14.5)
WBC: 8.1 10*3/uL (ref 3.8–10.6)
WBC: 8.6 10*3/uL (ref 3.8–10.6)

## 2016-01-08 LAB — BASIC METABOLIC PANEL
ANION GAP: 9 (ref 5–15)
BUN: 24 mg/dL — ABNORMAL HIGH (ref 6–20)
CALCIUM: 8.8 mg/dL — AB (ref 8.9–10.3)
CHLORIDE: 99 mmol/L — AB (ref 101–111)
CO2: 26 mmol/L (ref 22–32)
Creatinine, Ser: 0.82 mg/dL (ref 0.61–1.24)
GFR calc non Af Amer: >60 mL/min (ref >60)
Glucose, Bld: 101 mg/dL — ABNORMAL HIGH (ref 65–99)
Potassium: 4.1 mmol/L (ref 3.5–5.1)
SODIUM: 134 mmol/L — AB (ref 135–145)

## 2016-01-08 LAB — PROTIME-INR
INR: 0.95
Prothrombin Time: 12.9 seconds (ref 11.4–15.0)

## 2016-01-08 MED ORDER — PRAVASTATIN SODIUM 40 MG PO TABS
40.0000 mg | ORAL_TABLET | Freq: Every day | ORAL | Status: DC
  Administered 2016-01-08 – 2016-01-09 (×2): 40 mg via ORAL
  Filled 2016-01-08 (×2): qty 1

## 2016-01-08 MED ORDER — ONDANSETRON HCL 4 MG PO TABS: 4.0000 mg | ORAL_TABLET | Freq: Four times a day (QID) | ORAL | Status: DC | PRN

## 2016-01-08 MED ORDER — VITAMIN B-12 1000 MCG PO TABS
1000.0000 ug | ORAL_TABLET | Freq: Every day | ORAL | Status: DC
  Administered 2016-01-08 – 2016-01-10 (×3): 1000 ug via ORAL
  Filled 2016-01-08 (×3): qty 1

## 2016-01-08 MED ORDER — SODIUM CHLORIDE 0.9% FLUSH: 3.0000 mL | INTRAVENOUS | Status: DC | PRN

## 2016-01-08 MED ORDER — KETOTIFEN FUMARATE 0.025 % OP SOLN
1.0000 [drp] | Freq: Two times a day (BID) | OPHTHALMIC | Status: DC
  Filled 2016-01-08: qty 5

## 2016-01-08 MED ORDER — SODIUM CHLORIDE 0.9 % IV SOLN
250.0000 mL | INTRAVENOUS | Status: DC | PRN
  Administered 2016-01-08: 250 mL via INTRAVENOUS

## 2016-01-08 MED ORDER — ZOLPIDEM TARTRATE 5 MG PO TABS
5.0000 mg | ORAL_TABLET | Freq: Every evening | ORAL | Status: DC | PRN
  Administered 2016-01-08 – 2016-01-09 (×2): 5 mg via ORAL
  Filled 2016-01-08 (×2): qty 1

## 2016-01-08 MED ORDER — HYDRALAZINE HCL 20 MG/ML IJ SOLN
10.0000 mg | Freq: Once | INTRAMUSCULAR | Status: AC
  Administered 2016-01-08: 10 mg via INTRAVENOUS
  Filled 2016-01-08: qty 1

## 2016-01-08 MED ORDER — POLYVINYL ALCOHOL 1.4 % OP SOLN
2.0000 [drp] | Freq: Three times a day (TID) | OPHTHALMIC | Status: DC | PRN
  Administered 2016-01-08: 2 [drp] via OPHTHALMIC
  Filled 2016-01-08: qty 15

## 2016-01-08 MED ORDER — CEPHALEXIN 500 MG PO CAPS
500.0000 mg | ORAL_CAPSULE | Freq: Three times a day (TID) | ORAL | Status: DC
  Administered 2016-01-08 – 2016-01-10 (×7): 500 mg via ORAL
  Filled 2016-01-08 (×7): qty 1

## 2016-01-08 MED ORDER — CEPHALEXIN 500 MG PO CAPS
500.0000 mg | ORAL_CAPSULE | Freq: Once | ORAL | Status: AC
  Administered 2016-01-08: 500 mg via ORAL
  Filled 2016-01-08: qty 1

## 2016-01-08 MED ORDER — GABAPENTIN 300 MG PO CAPS
300.0000 mg | ORAL_CAPSULE | Freq: Every day | ORAL | Status: DC
Start: 2016-01-08 — End: 2016-01-10
  Administered 2016-01-08 – 2016-01-09 (×2): 300 mg via ORAL
  Filled 2016-01-08 (×2): qty 1

## 2016-01-08 MED ORDER — IMMUNE GLOBULIN (HUMAN) 5 GM/50ML IV SOLN
1.0000 g/kg | INTRAVENOUS | Status: AC
  Administered 2016-01-08 (×2): 75 g via INTRAVENOUS
  Filled 2016-01-08 (×2): qty 50

## 2016-01-08 MED ORDER — SODIUM CHLORIDE 0.9% FLUSH
3.0000 mL | Freq: Two times a day (BID) | INTRAVENOUS | Status: DC
  Administered 2016-01-08 – 2016-01-09 (×4): 3 mL via INTRAVENOUS

## 2016-01-08 MED ORDER — POLYETHYLENE GLYCOL 3350 17 G PO PACK
17.0000 g | PACK | Freq: Every day | ORAL | Status: DC | PRN
  Administered 2016-01-10: 17 g via ORAL
  Filled 2016-01-08: qty 1

## 2016-01-08 MED ORDER — ENSURE ENLIVE PO LIQD
237.0000 mL | Freq: Two times a day (BID) | ORAL | Status: DC
  Administered 2016-01-08 – 2016-01-10 (×5): 237 mL via ORAL

## 2016-01-08 MED ORDER — OXYMETAZOLINE HCL 0.05 % NA SOLN
NASAL | Status: AC
  Filled 2016-01-08: qty 15

## 2016-01-08 MED ORDER — DOCUSATE SODIUM 100 MG PO CAPS
100.0000 mg | ORAL_CAPSULE | Freq: Two times a day (BID) | ORAL | Status: DC
  Administered 2016-01-08 – 2016-01-10 (×5): 100 mg via ORAL
  Filled 2016-01-08 (×5): qty 1

## 2016-01-08 MED ORDER — ACETAMINOPHEN 500 MG PO TABS
500.0000 mg | ORAL_TABLET | Freq: Four times a day (QID) | ORAL | Status: DC | PRN
  Administered 2016-01-08 – 2016-01-09 (×2): 500 mg via ORAL
  Filled 2016-01-08 (×2): qty 1

## 2016-01-08 MED ORDER — PREDNISONE 20 MG PO TABS: 20.0000 mg | ORAL_TABLET | Freq: Four times a day (QID) | ORAL | Status: DC

## 2016-01-08 MED ORDER — METHYLPREDNISOLONE SODIUM SUCC 125 MG IJ SOLR
80.0000 mg | Freq: Two times a day (BID) | INTRAMUSCULAR | Status: DC
  Administered 2016-01-08 – 2016-01-09 (×3): 80 mg via INTRAVENOUS
  Filled 2016-01-08 (×3): qty 2

## 2016-01-08 MED ORDER — SODIUM CHLORIDE 0.9 % IV SOLN
10.0000 mL/h | Freq: Once | INTRAVENOUS | Status: AC
  Administered 2016-01-08: 10 mL/h via INTRAVENOUS

## 2016-01-08 MED ORDER — HYDRALAZINE HCL 20 MG/ML IJ SOLN
10.0000 mg | INTRAMUSCULAR | Status: DC | PRN
  Administered 2016-01-09: 10 mg via INTRAVENOUS
  Filled 2016-01-08: qty 1

## 2016-01-08 MED ORDER — LATANOPROST 0.005 % OP SOLN
1.0000 [drp] | Freq: Every day | OPHTHALMIC | Status: DC
Start: 2016-01-08 — End: 2016-01-10
  Administered 2016-01-08 – 2016-01-09 (×2): 1 [drp] via OPHTHALMIC
  Filled 2016-01-08: qty 2.5

## 2016-01-08 MED ORDER — LISINOPRIL 20 MG PO TABS
20.0000 mg | ORAL_TABLET | Freq: Every day | ORAL | Status: DC
Start: 2016-01-08 — End: 2016-01-10
  Administered 2016-01-08 – 2016-01-10 (×3): 20 mg via ORAL
  Filled 2016-01-08 (×3): qty 1

## 2016-01-08 MED ORDER — PANTOPRAZOLE SODIUM 40 MG PO TBEC
40.0000 mg | DELAYED_RELEASE_TABLET | Freq: Every day | ORAL | Status: DC
  Administered 2016-01-08 – 2016-01-10 (×3): 40 mg via ORAL
  Filled 2016-01-08 (×3): qty 1

## 2016-01-08 MED ORDER — ONDANSETRON HCL 4 MG/2ML IJ SOLN
4.0000 mg | Freq: Four times a day (QID) | INTRAMUSCULAR | Status: DC | PRN
  Administered 2016-01-09: 4 mg via INTRAVENOUS
  Filled 2016-01-08: qty 2

## 2016-01-08 MED ORDER — ELTROMBOPAG OLAMINE 75 MG PO TABS: 75.0000 mg | ORAL_TABLET | Freq: Every day | ORAL | Status: DC

## 2016-01-08 MED ORDER — TRANEXAMIC ACID 1000 MG/10ML IV SOLN
500.0000 mg | Freq: Once | INTRAVENOUS | Status: AC
  Administered 2016-01-08: 500 mg via TOPICAL
  Filled 2016-01-08: qty 10

## 2016-01-08 MED ORDER — CITALOPRAM HYDROBROMIDE 20 MG PO TABS
10.0000 mg | ORAL_TABLET | Freq: Every day | ORAL | Status: DC
  Administered 2016-01-08 – 2016-01-10 (×3): 10 mg via ORAL
  Filled 2016-01-08: qty 1
  Filled 2016-01-08: qty 2
  Filled 2016-01-08: qty 1

## 2016-01-08 NOTE — H&P (Signed)
[facility] [place] NAME: [name]    MR#:  [id number]  DATE OF BIRTH:  1932-02-03  DATE OF ADMISSION:  01/08/2016  PRIMARY CARE PHYSICIAN: BABAOFF, Caryl Bis, MD   REQUESTING/REFERRING PHYSICIAN:   CHIEF COMPLAINT:   Chief Complaint  Patient presents with  . Epistaxis    Pt. states he got labs back today showing his platelets were below 5000.    HISTORY OF PRESENT ILLNESS: [name]  is a 82 y.o. male with a known history of prostate cancer, hyperlipidemia, idiopathic thrombocytopenic purpura, autoimmune hemolytic anemia presented to the emergency room because he was having some nosebleed from the right nostril since yesterday. Patient got his labs back yesterday and platelet count was low below 5000. Patient at home placed a clamp over the nostrils stop the bleeding. Patient has a slow oozing of blood from the right nostril. And he presented to the emergency room blood pressure was high and he was given IV hydralazine to control it. Case was discussed by ER doctor with the hematologist on call who recommended IV immunoglobulin transfusion and 2 units of platelet transfusion. No history of chest pain. No complaints of shortness of breath. No fever or chills. No headache dizziness or blurry vision.  PAST MEDICAL HISTORY:   Past Medical History  Diagnosis Date  . Shingles   . Dehydration   . Hypertension   . Prostate cancer ([place])   . Hypercholesteremia   . 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 Insomnia   . Leukocytosis   . Autoimmune hemolytic anemia (HCC)   . Detached retina     PAST SURGICAL HISTORY: Past Surgical History  Procedure Laterality Date  . Hernia repair    . Eye surgery    . Artificial left eye    . Joint replacement    . Prostate surgery      SOCIAL HISTORY:  Social History  Substance Use Topics  . Smoking status: Never Smoker   . Smokeless tobacco: Not on file  . Alcohol Use:  No    FAMILY HISTORY:  Family History  Problem Relation Age of Onset  . Cancer Mother   . Multiple sclerosis Father     DRUG ALLERGIES:  Allergies  Allergen Reactions  . Cephalexin Diarrhea    C.diff  . Hydrocodone-Acetaminophen Nausea Only    Vomiting/dizziness    REVIEW OF SYSTEMS:   CONSTITUTIONAL: No fever, fatigue or weakness.  EYES: No blurred or double vision.  EARS, NOSE, AND THROAT: No tinnitus or ear pain. Has nasal bleed from right nostril RESPIRATORY: No cough, shortness of breath, wheezing or hemoptysis.  CARDIOVASCULAR: No chest pain, orthopnea, edema.  GASTROINTESTINAL: No nausea, vomiting, diarrhea or abdominal pain.  GENITOURINARY: No dysuria, hematuria.  ENDOCRINE: No polyuria, nocturia,  HEMATOLOGY: No anemia, has nasal bleeding SKIN: No rash or lesion. MUSCULOSKELETAL: No joint pain or arthritis.   NEUROLOGIC: No tingling, numbness, weakness.  PSYCHIATRY: No anxiety or depression.   MEDICATIONS AT HOME:  Prior to Admission medications   Medication Sig Start Date End Date Taking? Authorizing Provider  acetaminophen (TYLENOL) 500 MG tablet Take 500 mg by mouth every 6 (six) hours as needed for mild pain.    Historical Provider, MD  citalopram (CELEXA) 10 MG tablet Take 10 mg by mouth daily.    Historical Provider, MD  Cyanocobalamin (RA VITAMIN B-12 TR) 1000 MCG TBCR Take 1,000 mcg by mouth daily.    Historical Provider, MD  docusate sodium (COLACE) 100 MG capsule Take 1 capsule (100 mg total) by mouth 2 (two) times daily. 04/02/15 04/01/16  Harvest Dark, MD  eltrombopag (PROMACTA) 75 MG tablet Take 1 tablet (75 mg total) by mouth daily. Take on an empty stomach 1 hour before meals or 2 hours after. 12/29/15   [name], MD  feeding supplement, ENSURE ENLIVE, (ENSURE ENLIVE) LIQD Take 237 mLs by mouth 2 (two) times daily between meals. 04/08/15   [name], MD  gabapentin (NEURONTIN) 100 MG capsule Take 300 mg by mouth at bedtime.  Reported on 12/03/2015    Historical Provider, MD  latanoprost (XALATAN) 0.005 % ophthalmic solution Apply 1 drop to eye. At night 08/31/15 08/30/16  Historical Provider, MD  lisinopril (PRINIVIL,ZESTRIL) 20 MG tablet Take 20 mg by mouth daily.    Historical Provider, MD  omeprazole (PRILOSEC) 40 MG capsule TAKE ONE CAPSULE DAILY USUALLY THIRTY MINUTES BEFORE BREAKFAST 10/27/15   [name], MD  polyethylene glycol (MIRALAX / GLYCOLAX) packet Take 17 g by mouth daily as needed for mild constipation. 12/29/15   [name], MD  pravastatin (PRAVACHOL) 20 MG tablet Take 40 mg by mouth at bedtime.     Historical Provider, MD  predniSONE (DELTASONE) 20 MG tablet Take 1 tablet (20 mg total) by mouth 4 (four) times daily. (Take 4 tablets (20 mg each) by mouth daily-total of 80 mg) 12/07/15   [name], MD  zolpidem (AMBIEN) 5 MG tablet TAKE ONE TABLET AT BEDTIME AS NEEDED FOR SLEEPLESSNESS 07/13/15   Leia Alf, MD      PHYSICAL EXAMINATION:   VITAL SIGNS: Blood pressure 202/82, pulse 64, temperature 97.6 F (36.4 C), temperature source Oral, resp. rate 18, height 5\' 8"  (1.727 m), weight 74.39 kg (164 lb), SpO2 98 %.  GENERAL:  82 y.o.-year-old patient lying in the bed with no acute distress.  EYES: Pupils equal, round, reactive to light and accommodation. No scleral icterus. Extraocular muscles intact.  HEENT: Head atraumatic, normocephalic. Oropharynx and nasopharynx clear.Nasal clamp noted.  NECK:  Supple, no jugular venous distention. No thyroid enlargement, no tenderness.  LUNGS: Normal breath sounds bilaterally, no wheezing, rales,rhonchi or crepitation. No use of accessory muscles of respiration.  CARDIOVASCULAR: S1, S2 normal. No murmurs, rubs, or gallops.  ABDOMEN: Soft, nontender, nondistended. Bowel sounds present. No organomegaly or mass.  EXTREMITIES: No pedal edema, cyanosis, or clubbing.  NEUROLOGIC: Cranial nerves II through XII are intact. Muscle  strength 5/5 in all extremities. Sensation intact. Gait normal.  PSYCHIATRIC: The patient is alert and oriented x 3.  SKIN: No obvious rash, lesion, or ulcer.   LABORATORY PANEL:   CBC  Recent Labs Lab 01/03/16 1009 01/07/16 1015 01/08/16 0443  WBC 11.8* 9.0 8.1  HGB 11.4* 10.7* 10.7*  HCT 33.8* 31.1* 31.6*  PLT 45* <5* <5*  MCV 91.2 91.7 91.7  MCH 30.9 31.4 31.0  MCHC 33.8 34.3 33.8  RDW 15.8* 15.9* 16.2*  LYMPHSABS 0.7* 1.0 0.8*  MONOABS 0.8 0.5 0.7  EOSABS 0.1 0.0 0.0  BASOSABS 0.0 0.0 0.0   ------------------------------------------------------------------------------------------------------------------  Chemistries   Recent Labs Lab 01/08/16 0443  NA 134*  K 4.1  CL 99*  CO2 26  GLUCOSE 101*  BUN 24*  CREATININE 0.82  CALCIUM 8.8*   ------------------------------------------------------------------------------------------------------------------ estimated creatinine clearance is 68.4 mL/min (by C-G formula based on Cr of 0.82). ------------------------------------------------------------------------------------------------------------------ No results for input(s): TSH, T4TOTAL, T3FREE, THYROIDAB in the last 72 hours.  Invalid input(s): FREET3  Coagulation profile  Recent Labs Lab 01/08/16 0443  INR 0.95   ------------------------------------------------------------------------------------------------------------------- No results for input(s): DDIMER in the last 72 hours. -------------------------------------------------------------------------------------------------------------------  Cardiac Enzymes No results for input(s): CKMB, TROPONINI, MYOGLOBIN in the last 168 hours.  Invalid input(s): CK ------------------------------------------------------------------------------------------------------------------ Invalid input(s):  POCBNP  ---------------------------------------------------------------------------------------------------------------  Urinalysis    Component Value Date/Time   COLORURINE STRAW* 08/24/2015 1045   COLORURINE Yellow 12/08/2014 1450   APPEARANCEUR CLEAR* 08/24/2015 1045   APPEARANCEUR Clear 12/08/2014 1450   LABSPEC 1.004* 08/24/2015 1045   LABSPEC 1.043 12/08/2014 1450   PHURINE 8.0 08/24/2015 1045   PHURINE 7.0 12/08/2014 1450   GLUCOSEU NEGATIVE 08/24/2015 1045   GLUCOSEU Negative 12/08/2014 1450   HGBUR NEGATIVE 08/24/2015 1045   HGBUR Negative 12/08/2014 1450   BILIRUBINUR NEGATIVE 08/24/2015 1045   BILIRUBINUR Negative 12/08/2014 1450   KETONESUR NEGATIVE 08/24/2015 1045   KETONESUR Negative 12/08/2014 1450   PROTEINUR NEGATIVE 08/24/2015 1045   PROTEINUR Negative 12/08/2014 1450   NITRITE NEGATIVE 08/24/2015 1045   NITRITE Negative 12/08/2014 1450   LEUKOCYTESUR NEGATIVE 08/24/2015 1045   LEUKOCYTESUR Negative 12/08/2014 1450     RADIOLOGY: No results found.  EKG: Orders placed or performed during the hospital encounter of 08/24/15  . ED EKG  . ED EKG  . EKG 12-Lead  . EKG 12-Lead  . EKG    IMPRESSION AND PLAN: 82 year old male patient with history of prostate cancer, idiopathic thrombocytopenic purpura, hypertension, hyperlipidemia presented to the emergency room with epistaxis from right nose. Patient has elevated blood pressure. Admitting diagnosis 1. Epistaxis 2. Uncontrolled hypertension 3. Idiopathic thrombocytopenic purpura 4. Hyperlipidemia Treatment plan Admit patient to medical floor Transfuse 2 units of platelets intravenously IV immunoglobulin transfusion Follow-up platelet count Hematology consult Control blood pressure with oral antihypertensive medication and when necessary hydralazine.  All the records are reviewed and case discussed with ED provider. Management plans discussed with the patient, family and they are in  agreement.  CODE STATUS: FULL Code Status History    Date Active Date Inactive Code Status Order ID Comments User Context   04/06/2015  8:34 AM 04/08/2015  5:17 PM Full Code [id number]  [name], MD ED    Advance Directive Documentation        Most Recent Value   Type of Advance Directive  Healthcare Power of Attorney   Pre-existing out of facility DNR order (yellow form or pink MOST form)     "MOST" Form in Place?         TOTAL TIME TAKING CARE OF THIS PATIENT: 50 minutes.    Saundra Shelling M.D on 01/08/2016 at 6:39 AM  Between 7am to 6pm - Pager - [phone]  After 6pm go to [email or web address] - password [email or web address]  Office  [phone]  CC: Primary care physician; BABAOFF, Caryl Bis, MD

## 2016-01-08 NOTE — Progress Notes (Signed)
Dr Rogue Bussing made aware that [facility] ENT consult has to be called by Dr Rogue Bussing himself to on call ENT Dr Kathyrn Sheriff, Dr Kathyrn Sheriff paged with no return call, Dr Rogue Bussing aware and wants Dr Kathyrn Sheriff paged to his cell

## 2016-01-08 NOTE — Consult Note (Signed)
[facility] CONSULT NOTE  Patient Care Team: Derinda Late, MD as PCP - General (Family Medicine)  CHIEF COMPLAINTS/PURPOSE OF CONSULTATION: ITP acute  HISTORY OF PRESENTING ILLNESS:  [name]. 82 y.o.  male with a history of chronic ITP with a recent relapse [my patient in clinic] recently finished rituximab 4 approximately 2 weeks ago. Patient's platelets have been around 40-60,000 on 80 mg of prednisone. However, most recently the prednisone dose was decreased to 60 mg [while at [facility] for a second opinion on February 14- when his platelets around 30-40,000]. Patient's- platelet count yesterday was less than 5 when he was recommended to go up on his steroids to 80 mg a day.  However overnight patient noted to have worsening nosebleeds. He denies any headaches. Denies any mental status changes. No nausea no vomiting no blood in stools or black stools.  In the emergency room platelets are again noted less than 5; patient was started on IVIG; and also ordered platelet transfusion. Patient had his nose packed/clamped.  Patient complains of pressure in his right eye; no vision changes. Patient is a prosthetic left eye.   ROS: A complete 10 point review of system is done which is negative except mentioned above in history of present illness  MEDICAL HISTORY:  Past Medical History  Diagnosis Date  . Shingles   . Dehydration   . Hypertension   . Prostate cancer ([place])   . Hypercholesteremia   . 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 Insomnia   . Leukocytosis   . Autoimmune hemolytic anemia (HCC)   . Detached retina     SURGICAL HISTORY: Past Surgical History  Procedure Laterality Date  . Hernia repair    . Eye surgery    . Artificial left eye    . Joint replacement    . Prostate surgery      SOCIAL HISTORY: Social History   Social History  . Marital Status: Married    Spouse Name: N/A  . Number of Children: N/A  . Years of  Education: N/A   Occupational History  . retired    Social History Main Topics  . Smoking status: Never Smoker   . Smokeless tobacco: Not on file  . Alcohol Use: No  . Drug Use: No  . Sexual Activity: Not on file   Other Topics Concern  . Not on file   Social History Narrative    FAMILY HISTORY: Family History  Problem Relation Age of Onset  . Cancer Mother   . Multiple sclerosis Father     ALLERGIES:  is allergic to cephalexin and hydrocodone-acetaminophen.  MEDICATIONS:  Current Facility-Administered Medications  Medication Dose Route Frequency Provider Last Rate Last Dose  . 0.9 %  sodium chloride infusion  250 mL Intravenous PRN Saundra Shelling, MD      . acetaminophen (TYLENOL) tablet 500 mg  500 mg Oral Q6H PRN Pavan Pyreddy, MD      . cephALEXin (KEFLEX) capsule 500 mg  500 mg Oral 3 times per day Paulette Blanch, MD   500 mg at 01/08/16 0825  . citalopram (CELEXA) tablet 10 mg  10 mg Oral Daily Pavan Pyreddy, MD      . docusate sodium (COLACE) capsule 100 mg  100 mg Oral BID Pavan Pyreddy, MD      . eltrombopag (PROMACTA) tablet 75 mg  75 mg Oral Daily Pavan Pyreddy, MD      . feeding supplement (ENSURE ENLIVE) (ENSURE ENLIVE) liquid  237 mL  237 mL Oral BID BM Pavan Pyreddy, MD      . gabapentin (NEURONTIN) capsule 300 mg  300 mg Oral QHS Pavan Pyreddy, MD      . hydrALAZINE (APRESOLINE) injection 10 mg  10 mg Intravenous Q4H PRN Pavan Pyreddy, MD      . Immune Globulin 10% (OCTAGAM) IV infusion 75 g  1 g/kg Intravenous Q24 Hr x 2 Paulette Blanch, MD 180 mL/hr at 01/08/16 0824 75 g at 01/08/16 0824  . latanoprost (XALATAN) 0.005 % ophthalmic solution 1 drop  1 drop Both Eyes QHS Pavan Pyreddy, MD      . lisinopril (PRINIVIL,ZESTRIL) tablet 20 mg  20 mg Oral Daily Pavan Pyreddy, MD      . methylPREDNISolone sodium succinate (SOLU-MEDROL) 125 mg/2 mL injection 80 mg  80 mg Intravenous Q12H [name], MD   80 mg at 01/08/16 0807  . ondansetron (ZOFRAN) tablet 4 mg  4 mg  Oral Q6H PRN Saundra Shelling, MD       Or  . ondansetron (ZOFRAN) injection 4 mg  4 mg Intravenous Q6H PRN Pavan Pyreddy, MD      . oxymetazoline (AFRIN) 0.05 % nasal spray           . pantoprazole (PROTONIX) EC tablet 40 mg  40 mg Oral Daily Pavan Pyreddy, MD      . polyethylene glycol (MIRALAX / GLYCOLAX) packet 17 g  17 g Oral Daily PRN Pavan Pyreddy, MD      . pravastatin (PRAVACHOL) tablet 40 mg  40 mg Oral QHS Pavan Pyreddy, MD      . sodium chloride flush (NS) 0.9 % injection 3 mL  3 mL Intravenous Q12H Pavan Pyreddy, MD      . sodium chloride flush (NS) 0.9 % injection 3 mL  3 mL Intravenous PRN Pavan Pyreddy, MD      . vitamin B-12 (CYANOCOBALAMIN) tablet 1,000 mcg  1,000 mcg Oral Daily Pavan Pyreddy, MD      . zolpidem (AMBIEN) tablet 5 mg  5 mg Oral QHS PRN Saundra Shelling, MD          .  PHYSICAL EXAMINATION:   Filed Vitals:   01/08/16 0831 01/08/16 0851  BP: 149/73 168/76  Pulse: 68 68  Temp: 97.4 F (36.3 C) 97.7 F (36.5 C)  Resp: 16 18   Filed Weights   01/08/16 0257 01/08/16 0851  Weight: 164 lb (74.39 kg) 167 lb 1.6 oz (75.796 kg)    GENERAL: Well-nourished well-developed; Alert, no distress and comfortable. Accompanied by his wife. EYES: no pallor or icterus. Mild swelling /ecchymosis noted on the right eye. No conjunctival hemorrhage noted  OROPHARYNX: no thrush or ulceration; good dentition; petechial bleeding noted in the soft palate.   NECK: supple, no masses felt LYMPH:  no palpable lymphadenopathy in the cervical, axillary or inguinal regions LUNGS: clear to auscultation and  No wheeze or crackles HEART/CVS: regular rate & rhythm and no murmurs; No lower extremity edema ABDOMEN: abdomen soft, non-tender and normal bowel sounds Musculoskeletal:no cyanosis of digits and no clubbing  PSYCH: alert & oriented x 3 with fluent speech NEURO: no focal motor/sensory deficits SKIN:   Few petechiae noted in the lower extremities.   LABORATORY DATA:  I have  reviewed the data as listed Lab Results  Component Value Date   WBC 8.1 01/08/2016   HGB 10.7* 01/08/2016   HCT 31.6* 01/08/2016   MCV 91.7 01/08/2016   PLT <5* 01/08/2016  Recent Labs  02/10/15 1324  03/17/15 1425 03/23/15 2317  04/02/15 1013  09/01/15 0957 12/03/15 0820 01/08/16 0443  NA 129*  < > 131* 130*  < >  --   < > 131* 134* 134*  K 4.3  < > 3.9 3.9  < >  --   < > 4.1 3.4* 4.1  CL 97*  < > 101 95*  < >  --   < > 102 105 99*  CO2 26  < > 26 26  < >  --   < > 25 26 26   GLUCOSE 168*  < > 161* 107*  < >  --   < > 114* 126* 101*  BUN 17  < > 17 15  < >  --   < > 18 20 24*  CREATININE 0.88  < > 0.90 0.84  < >  --   < > 0.88 0.84 0.82  CALCIUM 8.2*  < > 8.5* 8.8*  < >  --   < > 8.2* 8.6* 8.8*  GFRNONAA >60  < > >60 >60  < >  --   < > >60 >60 >60  GFRAA >60  < > >60 >60  < >  --   < > >60 >60 >60  PROT 6.3*  < > 6.7 6.8  --  7.0  --  6.9  --   --   ALBUMIN 3.3*  < > 3.7 3.7  --  4.0  --  3.9  --   --   AST 24  < > 25 22  --  28  --  28  --   --   ALT 29  < > 23 19  --  28  --  21  --   --   ALKPHOS 48  < > 44 55  --  59  --  65  --   --   BILITOT 0.5  < > 0.6 0.8  --  0.7  --  0.5  --   --   BILIDIR  --   --   --   --   --  0.1  --   --   --   --   IBILI SEE COMMENT  --  SEE COMMENT  --   --  0.6  --   --   --   --   < > = values in this interval not displayed.    ASSESSMENT & PLAN:   # ACUTE ITP-  Platelets less than 5; patient's symptomatic with nose bleeding. Patient is receiving 2 units of platelet transfusion. Also started on IVIG 1 g/kg 2 days.   # Patient has been started on CellCept 500 mg twice a day at [facility] on February 14. This is currently on hold. Patient understands that CellCept might take weeks for his ITP to respond. And, most likely patient is failing steroids [as the patient's platelets dropped to less than 5 while he was on  prednisone 60 mg]. Unfortunately patient cannot afford Luiz Blare though approved by insurance co-pay is around $3000]. I  would recommend N-plate as the next option/we'll check into insurance coverage etc.   # Right eye pressure/ecchymosis- recommend evaluation with a CT of the orbits to evaluate for hemorrhage- this patient the context of his low platelets/ and left eye prosthesis.  The above plan of care was discussed with the patient and his wife in detail. They agree. I also spoke to Dr. Bobbye Charleston.  Thank you Dr.Whiteing for allowing me to participate in the care of your pleasant patient. Please do not hesitate to contact me with questions or concerns in the interim.      [name], MD 01/08/2016 9:06 AM

## 2016-01-08 NOTE — ED Provider Notes (Signed)
[facility] Emergency Department Provider Note  ____________________________________________  Time seen: Approximately 5:06 AM  I have reviewed the triage vital signs and the nursing notes.   HISTORY  Chief Complaint Epistaxis    HPI [name]. is a 82 y.o. male who presents to the ED from home with a chief complaint of nosebleed. Patient has a history of ITP currently on prednisone therapy. His hematologist Dr. Rogue Bussing has been trying to decrease his prednisone dosage since he has been on it for over one year. Recently his prednisone was decreased from 80 mg to 40 mg. Patient has been receiving twice weekly blood work with decreasing platelet counts. 3 days ago he was seen at [facility] for second opinion. Hematologist there placed patient on CellCept as well as increasing his prednisone from 40 mg to 60 mg. Patient was called by his hematologist yesterday with platelet count of 5000. He was instructed to come to the emergency department should he experience any bleeding over the weekend.Patient has had nasal congestion recently. Reports slow trickle of bleeding from right naris for the past 12-18 hours. Use nasal clamp and ice for several hours last evening. Ultimately patient packed his right near with gauze prior to arrival which has controlled the bleeding. Patient denies associated symptoms of headache, vision changes, dizziness, lightheadedness, chest pain, shortness of breath, abdominal pain, nausea, vomiting, diarrhea, bloody stools. He does report increased bruising over his extremities   Past Medical History  Diagnosis Date  . Shingles   . Dehydration   . Hypertension   . Prostate cancer ([name])   . Hypercholesteremia   . 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 Insomnia   . Leukocytosis   . Autoimmune hemolytic anemia (HCC)   . Detached retina     Patient Active Problem List   Diagnosis Date Noted  . ITP (idiopathic  thrombocytopenic purpura) 09/01/2015  . Acute renal failure ([place]) 04/06/2015    Past Surgical History  Procedure Laterality Date  . Hernia repair    . Eye surgery    . Artificial left eye    . Joint replacement    . Prostate surgery      Current Outpatient Rx  Name  Route  Sig  Dispense  Refill  . acetaminophen (TYLENOL) 500 MG tablet   Oral   Take 500 mg by mouth every 6 (six) hours as needed for mild pain.         . citalopram (CELEXA) 10 MG tablet   Oral   Take 10 mg by mouth daily.         . Cyanocobalamin (RA VITAMIN B-12 TR) 1000 MCG TBCR   Oral   Take 1,000 mcg by mouth daily.         Marland Kitchen docusate sodium (COLACE) 100 MG capsule   Oral   Take 1 capsule (100 mg total) by mouth 2 (two) times daily.   60 capsule   2   . eltrombopag (PROMACTA) 75 MG tablet   Oral   Take 1 tablet (75 mg total) by mouth daily. Take on an empty stomach 1 hour before meals or 2 hours after.   30 tablet   3   . feeding supplement, ENSURE ENLIVE, (ENSURE ENLIVE) LIQD   Oral   Take 237 mLs by mouth 2 (two) times daily between meals.   237 mL   12   . gabapentin (NEURONTIN) 100 MG capsule   Oral   Take 300 mg by  mouth at bedtime. Reported on 12/03/2015         . latanoprost (XALATAN) 0.005 % ophthalmic solution   Ophthalmic   Apply 1 drop to eye. At night         . lisinopril (PRINIVIL,ZESTRIL) 20 MG tablet   Oral   Take 20 mg by mouth daily.         Marland Kitchen omeprazole (PRILOSEC) 40 MG capsule      TAKE ONE CAPSULE DAILY USUALLY THIRTY MINUTES BEFORE BREAKFAST   30 capsule   2   . polyethylene glycol (MIRALAX / GLYCOLAX) packet   Oral   Take 17 g by mouth daily as needed for mild constipation.   14 each   0   . pravastatin (PRAVACHOL) 20 MG tablet   Oral   Take 40 mg by mouth at bedtime.          . predniSONE (DELTASONE) 20 MG tablet   Oral   Take 1 tablet (20 mg total) by mouth 4 (four) times daily. (Take 4 tablets (20 mg each) by mouth daily-total of 80  mg)   120 tablet   6   . zolpidem (AMBIEN) 5 MG tablet      TAKE ONE TABLET AT BEDTIME AS NEEDED FOR SLEEPLESSNESS   30 tablet   1     FAXED REQUEST TO FOLLOW CONTROLLED SUBSTANCE     Allergies Cephalexin and Hydrocodone-acetaminophen  Family History  Problem Relation Age of Onset  . Cancer Mother   . Multiple sclerosis Father     Social History Social History  Substance Use Topics  . Smoking status: Never Smoker   . Smokeless tobacco: None  . Alcohol Use: No    Review of Systems Constitutional: No fever/chills. Eyes: No visual changes. ENT: Positive for right nosebleed. No sore throat. Cardiovascular: Denies chest pain. Respiratory: Denies shortness of breath. Gastrointestinal: No abdominal pain.  No nausea, no vomiting.  No diarrhea.  No constipation. Genitourinary: Negative for dysuria. Musculoskeletal: Negative for back pain. Skin: Positive for bruising in extremities. Negative for rash. Neurological: Negative for headaches, focal weakness or numbness.  10-point ROS otherwise negative.  ____________________________________________   PHYSICAL EXAM:  VITAL SIGNS: ED Triage Vitals  Enc Vitals Group     BP 01/08/16 0257 148/72 mmHg     Pulse Rate 01/08/16 0257 63     Resp 01/08/16 0257 18     Temp 01/08/16 0257 97.5 F (36.4 C)     Temp Source 01/08/16 0257 Oral     SpO2 01/08/16 0257 97 %     Weight 01/08/16 0257 164 lb (74.39 kg)     Height 01/08/16 0257 5\' 8"  (1.727 m)     Head Cir --      Peak Flow --      Pain Score 01/08/16 0258 0     Pain Loc --      Pain Edu? --      Excl. in [place]? --     Constitutional: Alert and oriented. Well appearing and in no acute distress. Eyes: Conjunctivae are normal. PERRL. EOMI. Head: Atraumatic. Nose: Gauze packing in right nares. No active bleeding. Mouth/Throat: No bleeding in posterior oropharynx. Mucous membranes are moist.  Oropharynx non-erythematous. Neck: No stridor.   Cardiovascular: Normal rate,  regular rhythm. Grossly normal heart sounds.  Good peripheral circulation. Respiratory: Normal respiratory effort.  No retractions. Lungs CTAB. Gastrointestinal: Soft and nontender. No distention. No abdominal bruits. No CVA tenderness. Musculoskeletal: No lower extremity tenderness  nor edema.  No joint effusions. Neurologic:  Normal speech and language. No gross focal neurologic deficits are appreciated. No gait instability. Skin:  Skin is warm, dry and intact. No rash noted. Scattered bruises bilateral forearms. Psychiatric: Mood and affect are normal. Speech and behavior are normal.  ____________________________________________   LABS (all labs ordered are listed, but only abnormal results are displayed)  Labs Reviewed  CBC WITH DIFFERENTIAL/PLATELET - Abnormal; Notable for the following:    RBC 3.45 (*)    Hemoglobin 10.7 (*)    HCT 31.6 (*)    RDW 16.2 (*)    Platelets <5 (*)    Neutro Abs 6.7 (*)    Lymphs Abs 0.8 (*)    All other components within normal limits  BASIC METABOLIC PANEL - Abnormal; Notable for the following:    Sodium 134 (*)    Chloride 99 (*)    Glucose, Bld 101 (*)    BUN 24 (*)    Calcium 8.8 (*)    All other components within normal limits  PROTIME-INR  TYPE AND SCREEN  PREPARE PLATELET PHERESIS   ____________________________________________  EKG  None ____________________________________________  RADIOLOGY  None ____________________________________________   PROCEDURES  Procedure(s) performed: None  Critical Care performed: No  ____________________________________________   INITIAL IMPRESSION / ASSESSMENT AND PLAN / ED COURSE  Pertinent labs & imaging results that were available during my care of the patient were reviewed by me and considered in my medical decision making (see chart for details).  82 year old male with a history of ITP with reported platelets of 5000 yesterday who presents with nosebleed. Patient applied packing  prior to arrival; no active bleeding currently. Will obtain screening lab work including type and screen for possible platelet transfusion. Will discuss with hematology for medical management recommendations.  ----------------------------------------- 5:56 AM on 01/08/2016 -----------------------------------------  Discussed with Dr. Rogue Bussing (patient's hematologist) who recommends IVIG 1g/kg and transfuse 2 units of platelets. Discussed with hospitalist for admission. Discussed with patient and spouse; will keep packing in place for now. Will empirically start antibiotic to prevent infection. I have discussed with Dr. Estanislado Pandy (hospitalist) and recommended removing the packing after patient receives his platelet transfusions.  ----------------------------------------- 6:40 AM on 01/08/2016 -----------------------------------------  Patient hypertensive and bleeding began from right nares through his packing. Cottonball soaked in TXA and placed in right nares. Nasal clamp and ice pack applied to bridge of nose.  ----------------------------------------- 7:31 AM on 01/08/2016 -----------------------------------------  Remove cottonball and reexamined. Patient continues to bleed. Merocel and Afrin applied to right nares. Slight amount of bleeding now from left nares. Clamp reapplied. Care transferred to Dr. Cinda Quest for reassessment. ____________________________________________   FINAL CLINICAL IMPRESSION(S) / ED DIAGNOSES  Final diagnoses:  ITP (idiopathic thrombocytopenic purpura)  Epistaxis  Thrombocytopenia (HCC)      Paulette Blanch, MD 01/08/16 [phone]

## 2016-01-08 NOTE — Progress Notes (Signed)
Patient ID: Keith Hayen., male   DOB: [birth year], 82 y.o.   MRN: [id number] [facility] PROGRESS NOTE  [name]. [id number] DOB: 1932-03-17 DOA: 01/08/2016 PCP: Marcello Fennel, MD  HPI/Subjective: Patient presented with a nosebleed and found to have a platelet count of 5000. He has a history of ITP. Currently no bleeding through the nasal packing. He feels okay physically.  Objective: Filed Vitals:   01/08/16 0831 01/08/16 0851  BP: 149/73 168/76  Pulse: 68 68  Temp: 97.4 F (36.3 C) 97.7 F (36.5 C)  Resp: 16 18    Filed Weights   01/08/16 0257 01/08/16 0851  Weight: 74.39 kg (164 lb) 75.796 kg (167 lb 1.6 oz)    ROS: Review of Systems  Constitutional: Negative for fever and chills.  HENT: Positive for nosebleeds.   Eyes: Negative for blurred vision.  Respiratory: Negative for cough and shortness of breath.   Cardiovascular: Negative for chest pain.  Gastrointestinal: Negative for nausea, vomiting, abdominal pain, diarrhea and constipation.  Genitourinary: Negative for dysuria.  Musculoskeletal: Negative for joint pain.  Neurological: Negative for dizziness and headaches.   Exam: Physical Exam  Constitutional: He is oriented to person, place, and time.  HENT:  Nose: No mucosal edema.  Mouth/Throat: No oropharyngeal exudate or posterior oropharyngeal edema.  Eyes: Conjunctivae, EOM and lids are normal. Pupils are equal, round, and reactive to light.  Neck: No JVD present. Carotid bruit is not present. No edema present. No thyroid mass and no thyromegaly present.  Cardiovascular: S1 normal and S2 normal.  Exam reveals no gallop.   No murmur heard. Pulses:      Dorsalis pedis pulses are 2+ on the right side, and 2+ on the left side.  Respiratory: No respiratory distress. He has no wheezes. He has no rhonchi. He has no rales.  GI: Soft. Bowel sounds are normal. There is no tenderness.  Musculoskeletal:       Right ankle: He exhibits  no swelling.       Left ankle: He exhibits no swelling.  Lymphadenopathy:    He has no cervical adenopathy.  Neurological: He is alert and oriented to person, place, and time. No cranial nerve deficit.  Skin: Skin is warm. Nails show no clubbing.  Some petechiae seen on the arms  Psychiatric: He has a normal mood and affect.      Data Reviewed: Basic Metabolic Panel:  Recent Labs Lab 01/08/16 0443  NA 134*  K 4.1  CL 99*  CO2 26  GLUCOSE 101*  BUN 24*  CREATININE 0.82  CALCIUM 8.8*   CBC:  Recent Labs Lab 01/03/16 1009 01/07/16 1015 01/08/16 0443  WBC 11.8* 9.0 8.1  NEUTROABS 10.1* 7.5* 6.7*  HGB 11.4* 10.7* 10.7*  HCT 33.8* 31.1* 31.6*  MCV 91.2 91.7 91.7  PLT 45* <5* <5*    Scheduled Meds: . cephALEXin  500 mg Oral 3 times per day  . citalopram  10 mg Oral Daily  . docusate sodium  100 mg Oral BID  . feeding supplement (ENSURE ENLIVE)  237 mL Oral BID BM  . gabapentin  300 mg Oral QHS  . latanoprost  1 drop Both Eyes QHS  . lisinopril  20 mg Oral Daily  . methylPREDNISolone (SOLU-MEDROL) injection  80 mg Intravenous Q12H  . oxymetazoline      . pantoprazole  40 mg Oral Daily  . pravastatin  40 mg Oral QHS  . sodium chloride flush  3 mL  Intravenous Q12H  . vitamin B-12  1,000 mcg Oral Daily    Assessment/Plan:  1. Severe thrombocytopenia with epistaxis. The patient has a history of ITP. Intravenous immune globulin was ordered for 2 days. Solu-Medrol 80 mg IV every 12 hours ordered. Case discussed with hematology. Every time his prednisone dose lowered down he gets a relapse. They're looking into other options as outpatient to try to keep his platelet count up. Nosebleed packed in the ER packing will have to stay in for 1 week empiric antibiotics given. 2. Postherpetic neuralgia on gabapentin 3. Hyperlipidemia unspecified on pravastatin 4. Gastroesophageal reflux disease on Protonix 5. Essential hypertension on lisinopril 6. Glaucoma on  latanoprost 7. Depression on Celexa  Code Status:     Code Status Orders        Start     Ordered   01/08/16 0851  Full code   Continuous     01/08/16 0851    Code Status History    Date Active Date Inactive Code Status Order ID Comments User Context   04/06/2015  8:34 AM 04/08/2015  5:17 PM Full Code [id number]  [name], MD ED    Advance Directive Documentation        Most Recent Value   Type of Advance Directive  Healthcare Power of [place], Living will   Pre-existing out of facility DNR order (yellow form or pink MOST form)     "MOST" Form in Place?       Family Communication: Wife at the bedside Disposition Plan: Home once platelet count recovers  Consultants:  Hematology  Antibiotics:  Keflex  Time spent: 35 minutes  [name]  [facility]

## 2016-01-08 NOTE — ED Notes (Signed)
MD Beather Arbour ordered nasal clamp and ice to bridge of nose. Both placed on pt

## 2016-01-08 NOTE — ED Notes (Signed)
Ice removed from bridge of nose

## 2016-01-08 NOTE — Consult Note (Signed)
[name], [name] Aug 12, 1932 [name], MD  Reason for Consult: Uncontrolled epistaxis  HPI: The patient is an 82 year old white male who has had problems with epistaxis on and off over several years because of ITP and low platelet counts. He was admitted to the hospital last night with a nose bleed from his right side required packing. His platelet count was down to 5000. He is received some platelets already. He had stopped bleeding until sometime around noon today when he sneezed and started having some bleeding from the right side again. Consultation was placed to evaluate this. The patient was seen 6 weeks ago by Dr. Tami Ribas for some bleeding issues. He's been using a humidifier at home. He is not having a lot of blood pressure issues but his blood pressure was quite high when he was in the emergency room.  Allergies:  Allergies  Allergen Reactions  . Cephalexin Diarrhea    C.diff  . Hydrocodone-Acetaminophen Nausea Only    Vomiting/dizziness    ROS: Review of systems normal other than 12 systems except per HPI.  PMH:  Past Medical History  Diagnosis Date  . Shingles   . Dehydration   . Hypertension   . Prostate cancer ([place])   . Hypercholesteremia   . 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 Insomnia   . Leukocytosis   . Autoimmune hemolytic anemia (HCC)   . Detached retina     FH:  Family History  Problem Relation Age of Onset  . Cancer Mother   . Multiple sclerosis Father     SH:  Social History   Social History  . Marital Status: Married    Spouse Name: N/A  . Number of Children: N/A  . Years of Education: N/A   Occupational History  . retired    Social History Main Topics  . Smoking status: Never Smoker   . Smokeless tobacco: Not on file  . Alcohol Use: No  . Drug Use: No  . Sexual Activity: Not on file   Other Topics Concern  . Not on file   Social History Narrative    PSH:  Past Surgical History  Procedure  Laterality Date  . Hernia repair    . Eye surgery    . Artificial left eye    . Joint replacement    . Prostate surgery      Physical  Exam: Well-developed and well-nourished white male in no acute distress. CN 2-12 grossly intact and symmetric.  Oral cavity, lips, gums, ororpharynx normal with no masses or lesions. Skin warm and dry. Nasal cavity with sponge packing in his right nostril. It is hanging out some and this was trimmed off. There is no active bleeding currently. External nose and ears without masses or lesions. EOMI, PERRLA. Neck supple with no masses or lesions. No lymphadenopathy palpated.    A/P: He has had epistaxis because of low platelet counts and hypertension. The blood pressure is down to normal now and he's received a couple of units of platelets. His bleeding has currently stopped and its unlikely that it will restart again. Sponge is in a good position and needs to remain for a total of 5 days. Explained this to the patient and his wife and went over different options as well. They understand and are willing to leave the sponge pack in. They will make a follow-up at the [facility] office in 5 days to see either Dr. Tami Ribas or Dr. Pryor Ochoa whom he has seen in the past.  That would be the appropriate time to get the packing removed. There is no reason to remove or change the packing currently, if his bleeding has subsided. He knows the importance of resting as well and using the humidification. He is avoiding aspirin products.   [name] 01/08/2016 7:51 PM

## 2016-01-08 NOTE — ED Notes (Signed)
Pt nose has begun to bleed. Pt coughing up blood that he reports is dripping down his throat

## 2016-01-08 NOTE — ED Notes (Signed)
Patient stable, family at bedside, preparing for transfer to rm 122.

## 2016-01-08 NOTE — ED Notes (Signed)
Admitting MD notified of patient's BP. MD ordered 10 hydralazine

## 2016-01-08 NOTE — Progress Notes (Signed)
Spoke with Dr Rogue Bussing and made him aware that pt sneezed and his nose started to bleed again, needs to be repacked, MD ordered STAT CBC, and CT of head w/o contrast, also wanted me to page on call MD about repacking nose, spoke with Dr Doy Hutching in the ED, new order for STAT ENT to repack nose

## 2016-01-08 NOTE — ED Notes (Signed)
MD Beather Arbour notified of pt's BP

## 2016-01-08 NOTE — ED Notes (Signed)
kristarn in room, debbie nt in room. Patient stable in bedroom 122

## 2016-01-08 NOTE — ED Notes (Signed)
Pt. States he is ITP.  Pt. States PCP called to say his platelets were below 5000.  Pt. States slow nose bleed for the past 24 hours.  Pt. States ongoing issue starting last February.

## 2016-01-08 NOTE — ED Notes (Signed)
Right nare packing in place by MD Beather Arbour.

## 2016-01-08 NOTE — ED Notes (Signed)
Pt reports nose bleed for approx 12 hours. Pt reports being told he has decreased platelet count. Pt reports he used nose pincher and ice for several hours last night with no decrease in blood flow. Pt's nose is not currently bleeding. Pt denies pain.

## 2016-01-08 NOTE — ED Notes (Signed)
MD Sung at bedside. 

## 2016-01-09 ENCOUNTER — Other Ambulatory Visit: Payer: Self-pay | Admitting: Internal Medicine

## 2016-01-09 DIAGNOSIS — D696 Thrombocytopenia, unspecified: Secondary | ICD-10-CM | POA: Diagnosis not present

## 2016-01-09 DIAGNOSIS — R110 Nausea: Secondary | ICD-10-CM | POA: Diagnosis not present

## 2016-01-09 DIAGNOSIS — D693 Immune thrombocytopenic purpura: Secondary | ICD-10-CM | POA: Diagnosis not present

## 2016-01-09 DIAGNOSIS — R040 Epistaxis: Secondary | ICD-10-CM | POA: Diagnosis not present

## 2016-01-09 LAB — CBC
HEMATOCRIT: 30 % — AB (ref 40.0–52.0)
Hemoglobin: 10.1 g/dL — ABNORMAL LOW (ref 13.0–18.0)
MCH: 30.8 pg (ref 26.0–34.0)
MCHC: 33.8 g/dL (ref 32.0–36.0)
MCV: 90.9 fL (ref 80.0–100.0)
Platelets: 48 10*3/uL — ABNORMAL LOW (ref 150–440)
RBC: 3.3 MIL/uL — ABNORMAL LOW (ref 4.40–5.90)
RDW: 16.4 % — ABNORMAL HIGH (ref 11.5–14.5)
WBC: 11.1 10*3/uL — AB (ref 3.8–10.6)

## 2016-01-09 LAB — PREPARE PLATELET PHERESIS
UNIT DIVISION: 0
Unit division: 0

## 2016-01-09 LAB — BASIC METABOLIC PANEL
ANION GAP: 5 (ref 5–15)
BUN: 20 mg/dL (ref 6–20)
CALCIUM: 8.5 mg/dL — AB (ref 8.9–10.3)
CO2: 28 mmol/L (ref 22–32)
Chloride: 103 mmol/L (ref 101–111)
Creatinine, Ser: 0.65 mg/dL (ref 0.61–1.24)
GFR calc Af Amer: >60 mL/min (ref >60)
Glucose, Bld: 128 mg/dL — ABNORMAL HIGH (ref 65–99)
POTASSIUM: 3.7 mmol/L (ref 3.5–5.1)
SODIUM: 136 mmol/L (ref 135–145)

## 2016-01-09 MED ORDER — IMMUNE GLOBULIN (HUMAN) 5 GM/50ML IV SOLN
1.0000 g/kg | Freq: Once | INTRAVENOUS | Status: AC
  Administered 2016-01-09: 75 g via INTRAVENOUS
  Filled 2016-01-09: qty 50

## 2016-01-09 MED ORDER — PROMETHAZINE HCL 25 MG/ML IJ SOLN
12.5000 mg | Freq: Once | INTRAMUSCULAR | Status: AC
  Administered 2016-01-09: 12.5 mg via INTRAVENOUS

## 2016-01-09 MED ORDER — PROMETHAZINE HCL 25 MG/ML IJ SOLN
INTRAMUSCULAR | Status: AC
  Administered 2016-01-09: 12.5 mg via INTRAVENOUS
  Filled 2016-01-09: qty 1

## 2016-01-09 NOTE — Progress Notes (Signed)
[name].   DOB:Oct 31, 1932   [id number]    Subjective: Patient had bleeding yesterday from his right nose. ENT evaluated the patient overnight. Currently bleeding is resolved.  This morning he complains of mild nausea; no vomiting. Denies any chest pain or shortness of breath or cough.  ROS: No diarrhea; mild headache but not very intense. No bleeding gums.  Objective:  Filed Vitals:   01/09/16 0637 01/09/16 0835  BP: 167/65 151/65  Pulse: 72 67  Temp:    Resp:       Intake/Output Summary (Last 24 hours) at 01/09/16 0925 Last data filed at 01/09/16 0537  Gross per 24 hour  Intake 744.72 ml  Output   1950 ml  Net -1205.28 ml   GENERAL: Well-nourished well-developed; Alert, no distress and comfortable. Accompanied by his wife. EYES: no pallor or icterus. Mild swelling /ecchymosis noted on the right eye. Nose- gauze in the right nare/ no active bleeding.  OROPHARYNX: no thrush or ulceration; good dentition; petechial bleeding noted in the soft palate.  NECK: supple, no masses felt LYMPH: no palpable lymphadenopathy in the cervical, axillary or inguinal regions LUNGS: clear to auscultation and No wheeze or crackles HEART/CVS: regular rate & rhythm and no murmurs; No lower extremity edema ABDOMEN: abdomen soft, non-tender and normal bowel sounds Musculoskeletal:no cyanosis of digits and no clubbing  PSYCH: alert & oriented x 3 with fluent speech NEURO: no focal motor/sensory deficits SKIN: Few petechiae noted in the lower extremities.    Labs:  Lab Results  Component Value Date   WBC 11.1* 01/09/2016   HGB 10.1* 01/09/2016   HCT 30.0* 01/09/2016   MCV 90.9 01/09/2016   PLT 48* 01/09/2016   NEUTROABS 7.9* 01/08/2016    Lab Results  Component Value Date   NA 136 01/09/2016   K 3.7 01/09/2016   CL 103 01/09/2016   CO2 28 01/09/2016    Studies:  Ct Head Wo Contrast  01/08/2016  CLINICAL DATA:  Recurrent epistaxis after sneezing, low platelets  EXAM: CT HEAD WITHOUT CONTRAST TECHNIQUE: Contiguous axial images were obtained from the base of the skull through the vertex without intravenous contrast. COMPARISON:  None. FINDINGS: Age-related atrophy with low attenuation in the deep white matter. No evidence of vascular territory infarct or mass. No hydrocephalus. No hemorrhage or extra-axial fluid. Calvarium intact. Visualized portions of paranasal sinuses clear. IMPRESSION: Age-related involutional change with no acute findings Electronically Signed   By: Skipper Cliche M.D.   On: 01/08/2016 18:33    Assessment & Plan:   # ACUTE ITP- Platelets less than 5-at presentation; patient's symptomatic with nose bleeding. Status post IVIG 1 g/kg 1 day/2 units of platelets- platelets today are 65. Recommend completion of day #2 of IVIG today. Continue to hold CellCept for now. We'll discontinue steroids today. Plan N-plate starting this week/on outpatient basis.  # Nosebleed- appreciate ENT evaluation and recommendations.  # Right eye pressure/ecchymosis-CT brain negative.   # Mild nausea -question etiology. Discontinue IV steroids.  If patient clinically stable/platelets continued to improve - then potential discharge tomorrow after IVIG today. The above plan of care was discussed with the patient and his wife in detail. They agree. I also spoke to Dr. Bobbye Charleston.    [name], MD 01/09/2016  9:25 AM

## 2016-01-09 NOTE — Progress Notes (Signed)
Patient ID: Keith Kinman., male   DOB: [birth year], 82 y.o.   MRN: [id number] [facility] PROGRESS NOTE  [name]. [id number] DOB: 1932-11-10 DOA: 01/08/2016 PCP: Marcello Fennel, MD  HPI/Subjective: Patient had a little bleeding from the nose yesterday afternoon but no further bleeding today. Patient had nausea today and is sleeping most of the morning.  Objective: Filed Vitals:   01/09/16 0835 01/09/16 1301  BP: 151/65 131/65  Pulse: 67 72  Temp:  98 F (36.7 C)  Resp:  18    Filed Weights   01/08/16 0257 01/08/16 0851  Weight: 74.39 kg (164 lb) 75.796 kg (167 lb 1.6 oz)    ROS: Review of Systems  Constitutional: Negative for fever and chills.  HENT: Positive for nosebleeds.   Eyes: Negative for blurred vision.  Respiratory: Negative for cough and shortness of breath.   Cardiovascular: Negative for chest pain.  Gastrointestinal: Negative for nausea, vomiting, abdominal pain, diarrhea and constipation.  Genitourinary: Negative for dysuria.  Musculoskeletal: Negative for joint pain.  Neurological: Negative for dizziness and headaches.   Exam: Physical Exam  Constitutional: He is oriented to person, place, and time.  HENT:  Mouth/Throat: No oropharyngeal exudate or posterior oropharyngeal edema.  Packing right nostril  Eyes: Conjunctivae and lids are normal.  Right pupil equal round reactive to light. 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  Neck: No JVD present. Carotid bruit is not present. No edema present. No thyroid mass and no thyromegaly present.  Cardiovascular: S1 normal and S2 normal.  Exam reveals no gallop.   No murmur heard. Pulses:      Dorsalis pedis pulses are 2+ on the right side, and 2+ on the left side.  Respiratory: No respiratory distress. He has no wheezes. He has no rhonchi. He has no rales.  GI: Soft. Bowel sounds are normal. There is no tenderness.  Musculoskeletal:       Right ankle: He exhibits no  swelling.       Left ankle: He exhibits no swelling.  Lymphadenopathy:    He has no cervical adenopathy.  Neurological: He is alert and oriented to person, place, and time. No cranial nerve deficit.  Skin: Skin is warm. Nails show no clubbing.  Some petechiae seen on the arms  Psychiatric: He has a normal mood and affect.      Data Reviewed: Basic Metabolic Panel:  Recent Labs Lab 01/08/16 0443 01/09/16 0534  NA 134* 136  K 4.1 3.7  CL 99* 103  CO2 26 28  GLUCOSE 101* 128*  BUN 24* 20  CREATININE 0.82 0.65  CALCIUM 8.8* 8.5*   CBC:  Recent Labs Lab 01/03/16 1009 01/07/16 1015 01/08/16 0443 01/08/16 1804 01/09/16 0534  WBC 11.8* 9.0 8.1 8.6 11.1*  NEUTROABS 10.1* 7.5* 6.7* 7.9*  --   HGB 11.4* 10.7* 10.7* 9.5* 10.1*  HCT 33.8* 31.1* 31.6* 28.2* 30.0*  MCV 91.2 91.7 91.7 91.2 90.9  PLT 45* <5* <5* 65* 48*    Scheduled Meds: . cephALEXin  500 mg Oral 3 times per day  . citalopram  10 mg Oral Daily  . docusate sodium  100 mg Oral BID  . feeding supplement (ENSURE ENLIVE)  237 mL Oral BID BM  . gabapentin  300 mg Oral QHS  . latanoprost  1 drop Both Eyes QHS  . lisinopril  20 mg Oral Daily  . pantoprazole  40 mg Oral Daily  . pravastatin  40 mg Oral  QHS  . sodium chloride flush  3 mL Intravenous Q12H  . vitamin B-12  1,000 mcg Oral Daily    Assessment/Plan:  1. Severe thrombocytopenia with epistaxis. The patient has a history of ITP. Intravenous immune globulin was ordered again for today. Patient received Solu-Medrol 80 mg IV this a.m. Check CBC tomorrow. Platelet count up to 48,000 today. Likely will need high dose prednisone again upon discharge home. Hematology considering N-plate as outpatient. Patient will need to schedule an outpatient ENT appointment on Thursday to remove packing in the right nostril. 2. Postherpetic neuralgia on gabapentin 3. Hyperlipidemia unspecified on pravastatin 4. Gastroesophageal reflux disease on Protonix 5. Essential  hypertension on lisinopril 6. Glaucoma on latanoprost 7. Depression on Celexa 8. Nausea when necessary Zofran  Code Status:     Code Status Orders        Start     Ordered   01/08/16 0851  Full code   Continuous     01/08/16 0851    Code Status History    Date Active Date Inactive Code Status Order ID Comments User Context   04/06/2015  8:34 AM 04/08/2015  5:17 PM Full Code [id number]  [name], MD ED    Advance Directive Documentation        Most Recent Value   Type of Advance Directive  Healthcare Power of [place], Living will   Pre-existing out of facility DNR order (yellow form or pink MOST form)     "MOST" Form in Place?       Family Communication: Wife at the bedside Disposition Plan: Home potentially tomorrow  Consultants:  Hematology  ENT  Antibiotics:  Keflex  Time spent: 25 minutes  [name]  [facility]

## 2016-01-10 ENCOUNTER — Inpatient Hospital Stay: Payer: PPO

## 2016-01-10 DIAGNOSIS — D696 Thrombocytopenia, unspecified: Secondary | ICD-10-CM | POA: Diagnosis not present

## 2016-01-10 DIAGNOSIS — D693 Immune thrombocytopenic purpura: Secondary | ICD-10-CM | POA: Diagnosis not present

## 2016-01-10 DIAGNOSIS — R040 Epistaxis: Secondary | ICD-10-CM | POA: Diagnosis not present

## 2016-01-10 DIAGNOSIS — R110 Nausea: Secondary | ICD-10-CM | POA: Diagnosis not present

## 2016-01-10 LAB — CBC
HCT: 28.7 % — ABNORMAL LOW (ref 40.0–52.0)
Hemoglobin: 9.8 g/dL — ABNORMAL LOW (ref 13.0–18.0)
MCH: 31.5 pg (ref 26.0–34.0)
MCHC: 34.1 g/dL (ref 32.0–36.0)
MCV: 92.4 fL (ref 80.0–100.0)
PLATELETS: 72 10*3/uL — AB (ref 150–440)
RBC: 3.11 MIL/uL — AB (ref 4.40–5.90)
RDW: 16.6 % — ABNORMAL HIGH (ref 11.5–14.5)
WBC: 9.3 10*3/uL (ref 3.8–10.6)

## 2016-01-10 MED ORDER — PREDNISONE 20 MG PO TABS: ORAL_TABLET | ORAL | Status: DC

## 2016-01-10 MED ORDER — PREDNISONE 20 MG PO TABS
80.0000 mg | ORAL_TABLET | Freq: Every day | ORAL | Status: DC
  Administered 2016-01-10: 80 mg via ORAL
  Filled 2016-01-10: qty 4

## 2016-01-10 MED ORDER — SALINE SPRAY 0.65 % NA SOLN
2.0000 | Freq: Four times a day (QID) | NASAL | Status: DC
  Administered 2016-01-10: 2 via NASAL
  Filled 2016-01-10: qty 44

## 2016-01-10 MED ORDER — POLYVINYL ALCOHOL 1.4 % OP SOLN: 2.0000 [drp] | Freq: Three times a day (TID) | OPHTHALMIC | Status: DC | PRN

## 2016-01-10 MED ORDER — NYSTATIN 100000 UNIT/ML MT SUSP
5.0000 mL | Freq: Four times a day (QID) | OROMUCOSAL | Status: DC
  Administered 2016-01-10: 500000 [IU] via ORAL
  Filled 2016-01-10: qty 5

## 2016-01-10 MED ORDER — NYSTATIN 100000 UNIT/ML MT SUSP: 5.0000 mL | Freq: Four times a day (QID) | OROMUCOSAL | Status: DC

## 2016-01-10 MED ORDER — CEPHALEXIN 500 MG PO CAPS: 500.0000 mg | ORAL_CAPSULE | Freq: Three times a day (TID) | ORAL | Status: DC

## 2016-01-10 NOTE — Progress Notes (Signed)
Discussed discharge instruction with pt and his wife.  IV removed per policy.  No questions at this time.  Pt transported home via car by wife.  Clarise Cruz, RN

## 2016-01-10 NOTE — Discharge Summary (Signed)
[facility] [facility] [place] NAME: [name]    MR#:  [id number]  DATE OF BIRTH:  1932/02/11  DATE OF ADMISSION:  01/08/2016 ADMITTING PHYSICIAN: Saundra Shelling, MD  DATE OF DISCHARGE: 01/10/2016 11:20 AM  PRIMARY CARE PHYSICIAN: BABAOFF, MARC E, MD    ADMISSION DIAGNOSIS:  Epistaxis [R04.0] Thrombocytopenia (HCC) [D69.6] ITP (idiopathic thrombocytopenic purpura) [D69.3]  DISCHARGE DIAGNOSIS:  Principal Problem:   Nasal bleeding Active Problems:   Uncontrolled hypertension   Thrombocytopenia ([place])   SECONDARY DIAGNOSIS:   Past Medical History  Diagnosis Date  . Shingles   . Dehydration   . Hypertension   . Prostate cancer ([place])   . Hypercholesteremia   . 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 Insomnia   . Leukocytosis   . Autoimmune hemolytic anemia (HCC)   . Detached retina     HOSPITAL COURSE:   1.  Severe thrombocytopenia and epistaxis. The patient required packing of his right nostril by the ER physician. The patient was seen in consultation by ENT and recommended packing stay in for 5 days and follow-up as outpatient on Thursday to remove the packing.  Cephalexin given for a few days with the nasal packing.For severe thrombocytopenia had a platelet count on presentation of 5000. He was given 2 platelet transfusions , 2 infusions of IVIG , high-dose Solu-Medrol. He was seen in consultation by oncology. He has a history of chronic ITP going on for the last year. He's had problems coming off his prednisone. They will consider N-plate as outpatient. He will go home on 80 mg of prednisone until seen by oncology. Also consider Fosamax to prevent bone loss with calcium and vitamin D.  2. Postherpetic neuralgia on gabapentin  3. Hyperlipidemia on pravastatin  4. Gastroesophageal reflux disease on Protonix  5. Essential hypertension on lisinopril  6. Glaucoma on latanoprost  7. Depression on Celexa  8. Left  nasal congestion I did give saline nasal spray  9. Oral thrush-  Nystatin swish and swallow  DISCHARGE CONDITIONS:    satisfactory  CONSULTS OBTAINED:  Treatment Team:  [name], MD  DRUG ALLERGIES:   Allergies  Allergen Reactions  . Cephalexin Diarrhea    C.diff  . Hydrocodone-Acetaminophen Nausea Only    Vomiting/dizziness    DISCHARGE MEDICATIONS:   Discharge Medication List as of 01/10/2016  9:21 AM    START taking these medications   Details  cephALEXin (KEFLEX) 500 MG capsule Take 1 capsule (500 mg total) by mouth every 8 (eight) hours., Starting 01/10/2016, Until Discontinued, Print    nystatin (MYCOSTATIN) 100000 UNIT/ML suspension Take 5 mLs (500,000 Units total) by mouth 4 (four) times daily., Starting 01/10/2016, Until Discontinued, Print    polyvinyl alcohol (LIQUIFILM TEARS) 1.4 % ophthalmic solution Place 2 drops into the left eye 3 (three) times daily as needed for dry eyes., Starting 01/10/2016, Until Discontinued, Print      CONTINUE these medications which have CHANGED   Details  predniSONE (DELTASONE) 20 MG tablet 4 tablets orally daily, No Print      CONTINUE these medications which have NOT CHANGED   Details  acetaminophen (TYLENOL) 500 MG tablet Take 500 mg by mouth every 6 (six) hours as needed for mild pain., Until Discontinued, Historical Med    citalopram (CELEXA) 10 MG tablet Take 10 mg by mouth daily., Until Discontinued, Historical Med    Cyanocobalamin (RA VITAMIN B-12 TR) 1000 MCG TBCR Take 1,000 mcg by mouth daily., Until  Discontinued, Historical Med    docusate sodium (COLACE) 100 MG capsule Take 1 capsule (100 mg total) by mouth 2 (two) times daily., Starting 04/02/2015, Until Sat 04/01/16, Print    feeding supplement, ENSURE ENLIVE, (ENSURE ENLIVE) LIQD Take 237 mLs by mouth 2 (two) times daily between meals., Starting 04/08/2015, Until Discontinued, Normal    gabapentin (NEURONTIN) 100 MG capsule Take 300 mg by mouth at  bedtime. Reported on 12/03/2015, Until Discontinued, Historical Med    latanoprost (XALATAN) 0.005 % ophthalmic solution Place 1 drop into the right eye at bedtime. At night, Starting 08/31/2015, Until Wed 08/30/16, Historical Med    lisinopril (PRINIVIL,ZESTRIL) 20 MG tablet Take 20 mg by mouth every morning. , Until Discontinued, Historical Med    omeprazole (PRILOSEC) 40 MG capsule TAKE ONE CAPSULE DAILY USUALLY THIRTY MINUTES BEFORE BREAKFAST, Normal    polyethylene glycol (MIRALAX / GLYCOLAX) packet Take 17 g by mouth daily as needed for mild constipation., Starting 12/29/2015, Until Discontinued, Print    pravastatin (PRAVACHOL) 20 MG tablet Take 20 mg by mouth at bedtime. , Until Discontinued, Historical Med    zolpidem (AMBIEN) 5 MG tablet TAKE ONE TABLET AT BEDTIME AS NEEDED FOR SLEEPLESSNESS, Print      STOP taking these medications     mycophenolate (CELLCEPT) 500 MG tablet          DISCHARGE INSTRUCTIONS:    follow-up on Thursday with ENT for removal of nasal packing  Follow-up on Friday with oncology for follow-up of platelet count.  If you experience worsening of your admission symptoms, develop shortness of breath, life threatening emergency, suicidal or homicidal thoughts you must seek medical attention immediately by calling 911 or calling your MD immediately  if symptoms less severe.  You Must read complete instructions/literature along with all the possible adverse reactions/side effects for all the Medicines you take and that have been prescribed to you. Take any new Medicines after you have completely understood and accept all the possible adverse reactions/side effects.   Please note  You were cared for by a hospitalist during your hospital stay. If you have any questions about your discharge medications or the care you received while you were in the hospital after you are discharged, you can call the unit and asked to speak with the hospitalist on call if the  hospitalist that took care of you is not available. Once you are discharged, your primary care physician will handle any further medical issues. Please note that NO REFILLS for any discharge medications will be authorized once you are discharged, as it is imperative that you return to your primary care physician (or establish a relationship with a primary care physician if you do not have one) for your aftercare needs so that they can reassess your need for medications and monitor your lab values.    Today   CHIEF COMPLAINT:   Chief Complaint  Patient presents with  . Epistaxis    Pt. states he got labs back today showing his platelets were below 5000.    HISTORY OF PRESENT ILLNESS:  Debra Warmoth  is a 82 y.o. male with a known history of  Chronic ITP presented with epistaxis and found to have a low platelet count.   VITAL SIGNS:  Blood pressure 187/82, pulse 61, temperature 98.1 F (36.7 C), temperature source Oral, resp. rate 18, height 5\' 8"  (1.727 m), weight 75.796 kg (167 lb 1.6 oz), SpO2 96 %.    PHYSICAL EXAMINATION:  GENERAL:  82 y.o.-year-old  patient lying in the bed with no acute distress.  EYES: Pupils equal, round, reactive to light and accommodation. No scleral icterus. Extraocular muscles intact.  HEENT: Head atraumatic, normocephalic.  Oral thrush. NECK:  Supple, no jugular venous distention. No thyroid enlargement, no tenderness.  LUNGS: Normal breath sounds bilaterally, no wheezing, rales,rhonchi or crepitation. No use of accessory muscles of respiration.  CARDIOVASCULAR: S1, S2 normal. No murmurs, rubs, or gallops.  ABDOMEN: Soft, non-tender, non-distended. Bowel sounds present. No organomegaly or mass.  EXTREMITIES: No pedal edema, cyanosis, or clubbing.  NEUROLOGIC: Cranial nerves II through XII are intact. Muscle strength 5/5 in all extremities. Sensation intact. Gait not checked.  PSYCHIATRIC: The patient is alert and oriented x 3.  SKIN: No obvious rash,  lesion, or ulcer.   DATA REVIEW:   CBC  Recent Labs Lab 01/10/16 0409  WBC 9.3  HGB 9.8*  HCT 28.7*  PLT 72*    Chemistries   Recent Labs Lab 01/09/16 0534  NA 136  K 3.7  CL 103  CO2 28  GLUCOSE 128*  BUN 20  CREATININE 0.65  CALCIUM 8.5*    Cardiac Enzymes No results for input(s): TROPONINI in the last 168 hours.  Microbiology Results  Results for orders placed or performed during the hospital encounter of 04/06/15  Urine culture     Status: None   Collection Time: 04/06/15  5:20 AM  Result Value Ref Range Status   Specimen Description URINE, CLEAN CATCH  Final   Special Requests NONE  Final   Culture NO GROWTH 2 DAYS  Final   Report Status 04/08/2015 FINAL  Final  Blood culture (routine x 2)     Status: None   Collection Time: 04/06/15  8:50 AM  Result Value Ref Range Status   Specimen Description BLOOD  Final   Special Requests NONE  Final   Culture NO GROWTH 5 DAYS  Final   Report Status 04/11/2015 FINAL  Final  Blood culture (routine x 2)     Status: None   Collection Time: 04/06/15 10:08 AM  Result Value Ref Range Status   Specimen Description BLOOD  Final   Special Requests NONE  Final   Culture NO GROWTH 5 DAYS  Final   Report Status 04/11/2015 FINAL  Final    RADIOLOGY:  Ct Head Wo Contrast  01/08/2016  CLINICAL DATA:  Recurrent epistaxis after sneezing, low platelets EXAM: CT HEAD WITHOUT CONTRAST TECHNIQUE: Contiguous axial images were obtained from the base of the skull through the vertex without intravenous contrast. COMPARISON:  None. FINDINGS: Age-related atrophy with low attenuation in the deep white matter. No evidence of vascular territory infarct or mass. No hydrocephalus. No hemorrhage or extra-axial fluid. Calvarium intact. Visualized portions of paranasal sinuses clear. IMPRESSION: Age-related involutional change with no acute findings Electronically Signed   By: Skipper Cliche M.D.   On: 01/08/2016 18:33    Management plans  discussed with the patient, family and they are in agreement.  CODE STATUS:  Code Status History    Date Active Date Inactive Code Status Order ID Comments User Context   01/08/2016  8:51 AM 01/10/2016  2:21 PM Full Code [id number]  Saundra Shelling, MD Inpatient   04/06/2015  8:34 AM 04/08/2015  5:17 PM Full Code [id number]  [name], MD ED    Advance Directive Documentation        Most Recent Value   Type of Advance Directive  Healthcare Power of Attorney, Living will   Pre-existing  out of facility DNR order (yellow form or pink MOST form)     "MOST" Form in Place?        TOTAL TIME TAKING CARE OF THIS PATIENT:  35 minutes.    [name] M.D on 01/10/2016 at 3:28 PM  Between 7am to 6pm - Pager - [phone]  After 6pm go to [email or web address] - password [email or web address] [place] [facility]  Office  [phone]  CC: Primary care physician; BABAOFF, Caryl Bis, MD

## 2016-01-11 ENCOUNTER — Telehealth: Payer: Self-pay | Admitting: *Deleted

## 2016-01-11 NOTE — Telephone Encounter (Signed)
Per v/o Dr. Rogue Bussing. I contacted the patient and asked him to come tomorrow for a lab check (cbc). The patient states that he can come at 1030 am; however, he can not wait on his labs tomorrow. I told him that this would be ok. I can call him with the results.  I am working with the cancer center prior auth team to schedule NPLATE injections if his plt count would fall below 50. I left a msg for LuAnn in cancer center to see if his insurance has approved this drug. Per Dr. Jacinto Reap, the patient's Cellcept was discontinued this weekend.  I will update the patient tomorrow on the status of the NPlate as soon as I know more from prior auth team.

## 2016-01-12 ENCOUNTER — Inpatient Hospital Stay: Payer: PPO

## 2016-01-12 ENCOUNTER — Telehealth: Payer: Self-pay | Admitting: *Deleted

## 2016-01-12 DIAGNOSIS — Z79899 Other long term (current) drug therapy: Secondary | ICD-10-CM | POA: Diagnosis not present

## 2016-01-12 DIAGNOSIS — D591 Other autoimmune hemolytic anemias: Secondary | ICD-10-CM | POA: Diagnosis not present

## 2016-01-12 DIAGNOSIS — D693 Immune thrombocytopenic purpura: Secondary | ICD-10-CM

## 2016-01-12 DIAGNOSIS — Z8546 Personal history of malignant neoplasm of prostate: Secondary | ICD-10-CM | POA: Diagnosis not present

## 2016-01-12 DIAGNOSIS — Z5111 Encounter for antineoplastic chemotherapy: Secondary | ICD-10-CM | POA: Diagnosis not present

## 2016-01-12 DIAGNOSIS — E7800 Pure hypercholesterolemia, unspecified: Secondary | ICD-10-CM | POA: Diagnosis not present

## 2016-01-12 DIAGNOSIS — I10 Essential (primary) hypertension: Secondary | ICD-10-CM | POA: Diagnosis not present

## 2016-01-12 LAB — CBC WITH DIFFERENTIAL/PLATELET
BASOS ABS: 0 10*3/uL (ref 0–0.1)
BASOS PCT: 1 %
EOS ABS: 0 10*3/uL (ref 0–0.7)
Eosinophils Relative: 0 %
HCT: 31.8 % — ABNORMAL LOW (ref 40.0–52.0)
HEMOGLOBIN: 11.1 g/dL — AB (ref 13.0–18.0)
Lymphocytes Relative: 12 %
Lymphs Abs: 1.3 10*3/uL (ref 1.0–3.6)
MCH: 31.2 pg (ref 26.0–34.0)
MCHC: 34.8 g/dL (ref 32.0–36.0)
MCV: 89.8 fL (ref 80.0–100.0)
Monocytes Absolute: 0.5 10*3/uL (ref 0.2–1.0)
Monocytes Relative: 5 %
NEUTROS PCT: 82 %
Neutro Abs: 8.6 10*3/uL — ABNORMAL HIGH (ref 1.4–6.5)
Platelets: 170 10*3/uL (ref 150–440)
RBC: 3.54 MIL/uL — AB (ref 4.40–5.90)
RDW: 16.3 % — ABNORMAL HIGH (ref 11.5–14.5)
WBC: 10.4 10*3/uL (ref 3.8–10.6)

## 2016-01-12 NOTE — Telephone Encounter (Signed)
Called patient back at 1314. Teach back process performed with patient. Instructed pt to take 60 mg of Prednisone daily.

## 2016-01-12 NOTE — Telephone Encounter (Signed)
I contacted the patient regarding his plt count. His plt count is 170 today. I instructed the patient to keep his appointment on Friday as his labs could fluctuate. He expressed gratitude for the call.

## 2016-01-12 NOTE — Telephone Encounter (Signed)
Ask patient to start tapering his prednisone to 60mg /day x 1 week- further recommendations to follow; and continue to check labs this Friday as planned. Thanks

## 2016-01-13 ENCOUNTER — Telehealth: Payer: Self-pay | Admitting: *Deleted

## 2016-01-13 DIAGNOSIS — R040 Epistaxis: Secondary | ICD-10-CM | POA: Diagnosis not present

## 2016-01-13 DIAGNOSIS — D65 Disseminated intravascular coagulation [defibrination syndrome]: Secondary | ICD-10-CM | POA: Diagnosis not present

## 2016-01-13 NOTE — Telephone Encounter (Signed)
Received notification from cancer center prior auth team that [facility] has been approved.

## 2016-01-14 ENCOUNTER — Other Ambulatory Visit: Payer: Self-pay | Admitting: *Deleted

## 2016-01-14 ENCOUNTER — Encounter: Payer: Self-pay | Admitting: *Deleted

## 2016-01-14 ENCOUNTER — Inpatient Hospital Stay: Payer: PPO

## 2016-01-14 ENCOUNTER — Telehealth: Payer: Self-pay | Admitting: *Deleted

## 2016-01-14 ENCOUNTER — Other Ambulatory Visit: Payer: Self-pay | Admitting: Family Medicine

## 2016-01-14 DIAGNOSIS — Z5111 Encounter for antineoplastic chemotherapy: Secondary | ICD-10-CM | POA: Diagnosis not present

## 2016-01-14 DIAGNOSIS — D693 Immune thrombocytopenic purpura: Secondary | ICD-10-CM

## 2016-01-14 LAB — CBC WITH DIFFERENTIAL/PLATELET
BASOS ABS: 0 10*3/uL (ref 0–0.1)
BASOS PCT: 0 %
EOS ABS: 0 10*3/uL (ref 0–0.7)
Eosinophils Relative: 0 %
HCT: 30.3 % — ABNORMAL LOW (ref 40.0–52.0)
HEMOGLOBIN: 10.4 g/dL — AB (ref 13.0–18.0)
Lymphocytes Relative: 11 %
Lymphs Abs: 1.5 10*3/uL (ref 1.0–3.6)
MCH: 31.3 pg (ref 26.0–34.0)
MCHC: 34.3 g/dL (ref 32.0–36.0)
MCV: 91.2 fL (ref 80.0–100.0)
MONO ABS: 0.8 10*3/uL (ref 0.2–1.0)
MONOS PCT: 5 %
NEUTROS ABS: 11.9 10*3/uL — AB (ref 1.4–6.5)
NEUTROS PCT: 84 %
Platelets: 241 10*3/uL (ref 150–440)
RBC: 3.32 MIL/uL — ABNORMAL LOW (ref 4.40–5.90)
RDW: 16.7 % — AB (ref 11.5–14.5)
WBC: 14.2 10*3/uL — ABNORMAL HIGH (ref 3.8–10.6)

## 2016-01-14 NOTE — Patient Outreach (Signed)
Phone call - f/u on referral, recent discharge 2/20.  Spoke with pt, HIPPA verified, discussed [facility] services (transition of care program) to which pt gave verbal consent.   Pt reports been doing good since hospital discharge (nose bleed) f/u with ENT MD yesterday, has nose packing removed and f/u at lab today.   Pt gave phone to spouse to discuss his discharge medications to which spouse reports received a call from MD office to decrease his Prednisone to 40 mg.  BP good.   RN CM discussed with spouse pt f/u with Primary Care MD post discharge to which she said was not told to but will call.  RN CM discussed with pt as part of transition of care program to provide weekly phone calls 31 days post discharge) to which pt agreed.  As discussed with pt, coworker RN CM covering for this RN CM will be f/u again  telephonically next week.   Plan to inform Dr. Baldemar Lenis of [facility] involvement - fax letter in [place].   Zara Chess.   [place] [facility]  [phone]

## 2016-01-14 NOTE — Telephone Encounter (Signed)
-----   Message from [name], MD sent at 01/14/2016 12:37 PM EST ----- Ask pt to start tapering prednisone to 40 mg/once a day x1 week; keep checking cbc twice a week.

## 2016-01-17 ENCOUNTER — Inpatient Hospital Stay: Payer: PPO

## 2016-01-17 ENCOUNTER — Telehealth: Payer: Self-pay | Admitting: *Deleted

## 2016-01-17 DIAGNOSIS — Z5111 Encounter for antineoplastic chemotherapy: Secondary | ICD-10-CM | POA: Diagnosis not present

## 2016-01-17 DIAGNOSIS — D693 Immune thrombocytopenic purpura: Secondary | ICD-10-CM

## 2016-01-17 LAB — CBC WITH DIFFERENTIAL/PLATELET
BASOS ABS: 0.1 10*3/uL (ref 0–0.1)
BASOS PCT: 1 %
EOS ABS: 0.1 10*3/uL (ref 0–0.7)
EOS PCT: 1 %
HCT: 32.8 % — ABNORMAL LOW (ref 40.0–52.0)
Hemoglobin: 11.2 g/dL — ABNORMAL LOW (ref 13.0–18.0)
LYMPHS PCT: 12 %
Lymphs Abs: 1.5 10*3/uL (ref 1.0–3.6)
MCH: 31.3 pg (ref 26.0–34.0)
MCHC: 34.1 g/dL (ref 32.0–36.0)
MCV: 91.9 fL (ref 80.0–100.0)
Monocytes Absolute: 0.7 10*3/uL (ref 0.2–1.0)
Monocytes Relative: 6 %
Neutro Abs: 10 10*3/uL — ABNORMAL HIGH (ref 1.4–6.5)
Neutrophils Relative %: 80 %
PLATELETS: 308 10*3/uL (ref 150–440)
RBC: 3.57 MIL/uL — ABNORMAL LOW (ref 4.40–5.90)
RDW: 17.5 % — AB (ref 11.5–14.5)
WBC: 12.3 10*3/uL — AB (ref 3.8–10.6)

## 2016-01-17 NOTE — Telephone Encounter (Signed)
Pt informed that his plt count was 308. Pt has an apt on 01/21/16 which he will keep. Pt instructed to take 40 mg of prednisone daily. On Friday, we will let him know what dose of prednisone to take based on his lab values. Pt appreciative of the call back re: his labs. He states that he get his packing removed from his nose this week at ENT. He is also taking antibiotics per patient.

## 2016-01-17 NOTE — Telephone Encounter (Signed)
-----   Message from [name], MD sent at 01/17/2016  2:36 PM EST ----- Continue prednisone 40 mg/d  Until this Friday- and await cbc on Friday- re: further recommendations. Please inform pt Thx

## 2016-01-20 ENCOUNTER — Other Ambulatory Visit: Payer: Self-pay

## 2016-01-20 NOTE — Patient Outreach (Signed)
RNCM made telephone contact with patient who satisfied HIPPA identifiers. Patient denies any further nose bleed, states he thinks that has passed.  This RNCM and patient reviewed and completed the Depression Screening and Fall Risk. Patient advised that his RNCM, Kalman Shan would be back next week for further case management assessment of needs.  Plan: Update patient's primary care case manager regarding this telephone contact.

## 2016-01-21 ENCOUNTER — Inpatient Hospital Stay: Payer: PPO | Attending: Internal Medicine

## 2016-01-21 ENCOUNTER — Telehealth: Payer: Self-pay | Admitting: *Deleted

## 2016-01-21 DIAGNOSIS — Z8546 Personal history of malignant neoplasm of prostate: Secondary | ICD-10-CM | POA: Diagnosis not present

## 2016-01-21 DIAGNOSIS — D693 Immune thrombocytopenic purpura: Secondary | ICD-10-CM | POA: Diagnosis not present

## 2016-01-21 DIAGNOSIS — Z79899 Other long term (current) drug therapy: Secondary | ICD-10-CM | POA: Insufficient documentation

## 2016-01-21 DIAGNOSIS — I10 Essential (primary) hypertension: Secondary | ICD-10-CM | POA: Diagnosis not present

## 2016-01-21 DIAGNOSIS — E7800 Pure hypercholesterolemia, unspecified: Secondary | ICD-10-CM | POA: Diagnosis not present

## 2016-01-21 LAB — CBC WITH DIFFERENTIAL/PLATELET
BASOS ABS: 0 10*3/uL (ref 0–0.1)
BASOS PCT: 0 %
Eosinophils Absolute: 0.1 10*3/uL (ref 0–0.7)
Eosinophils Relative: 1 %
HEMATOCRIT: 31.8 % — AB (ref 40.0–52.0)
Hemoglobin: 11.1 g/dL — ABNORMAL LOW (ref 13.0–18.0)
LYMPHS PCT: 14 %
Lymphs Abs: 1.1 10*3/uL (ref 1.0–3.6)
MCH: 32.5 pg (ref 26.0–34.0)
MCHC: 35 g/dL (ref 32.0–36.0)
MCV: 93 fL (ref 80.0–100.0)
MONO ABS: 0.6 10*3/uL (ref 0.2–1.0)
Monocytes Relative: 8 %
NEUTROS ABS: 6.4 10*3/uL (ref 1.4–6.5)
NEUTROS PCT: 77 %
Platelets: 181 10*3/uL (ref 150–440)
RBC: 3.42 MIL/uL — AB (ref 4.40–5.90)
RDW: 17.8 % — ABNORMAL HIGH (ref 11.5–14.5)
WBC: 8.2 10*3/uL (ref 3.8–10.6)

## 2016-01-21 NOTE — Telephone Encounter (Signed)
Called patient. Results reviewed with the patient and his wife.  plt count is 181 today.  He was instructed to take prednisone 20 mg once a day x 2 weeks. He will have his labs checked twice a week. I gave him an appointment for lab only on Friday, 3/10, Monday, 3/13 and Friday, 3/17.  He already had an appointment on 01/24/16 for lab/md. The patient requested to keep this apt with Dr. Rogue Bussing.  Teach back process performed with md.

## 2016-01-21 NOTE — Telephone Encounter (Signed)
-----   Message from [name], MD sent at 01/21/2016 12:48 PM EST ----- Please inform patient to take prednisone 20 mg once a day x 2 weeks. Keep checking- CBC twice weekly.

## 2016-01-24 ENCOUNTER — Inpatient Hospital Stay: Payer: PPO

## 2016-01-24 ENCOUNTER — Inpatient Hospital Stay (HOSPITAL_BASED_OUTPATIENT_CLINIC_OR_DEPARTMENT_OTHER): Payer: PPO | Admitting: Internal Medicine

## 2016-01-24 VITALS — BP 92/54 | HR 75 | Temp 97.5°F | Ht 66.3 in | Wt 169.3 lb

## 2016-01-24 DIAGNOSIS — Z8546 Personal history of malignant neoplasm of prostate: Secondary | ICD-10-CM

## 2016-01-24 DIAGNOSIS — Z79899 Other long term (current) drug therapy: Secondary | ICD-10-CM

## 2016-01-24 DIAGNOSIS — D693 Immune thrombocytopenic purpura: Secondary | ICD-10-CM

## 2016-01-24 LAB — CBC WITH DIFFERENTIAL/PLATELET
BASOS ABS: 0 10*3/uL (ref 0–0.1)
Basophils Relative: 1 %
EOS PCT: 1 %
Eosinophils Absolute: 0 10*3/uL (ref 0–0.7)
HCT: 33.8 % — ABNORMAL LOW (ref 40.0–52.0)
HEMOGLOBIN: 11.6 g/dL — AB (ref 13.0–18.0)
LYMPHS PCT: 17 %
Lymphs Abs: 1.4 10*3/uL (ref 1.0–3.6)
MCH: 32.4 pg (ref 26.0–34.0)
MCHC: 34.3 g/dL (ref 32.0–36.0)
MCV: 94.5 fL (ref 80.0–100.0)
Monocytes Absolute: 0.6 10*3/uL (ref 0.2–1.0)
Monocytes Relative: 7 %
NEUTROS ABS: 6.3 10*3/uL (ref 1.4–6.5)
Neutrophils Relative %: 74 %
PLATELETS: 133 10*3/uL — AB (ref 150–440)
RBC: 3.58 MIL/uL — AB (ref 4.40–5.90)
RDW: 18.5 % — ABNORMAL HIGH (ref 11.5–14.5)
WBC: 8.4 10*3/uL (ref 3.8–10.6)

## 2016-01-24 NOTE — Progress Notes (Signed)
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   Patient was recently admitted to the hospital for severe nosebleeds/ platelet count of 5. Patient received IVIG in the hospital. An over the next 2 weeks his platelets have been above 150.  Patient denies any nosebleeds. He has intermittent episodes of fatigue. Patient is currently on prednisone taper/20 mg with the last 4 days.  He denies any weakness in his muscles; denies any swelling in the legs. Denies any difficulty swallowing. No pain with swallowing.   REVIEW OF SYSTEMS:  A complete 10 point review of system is done which is negative except mentioned above/history of present illness.   PAST MEDICAL HISTORY :  Past Medical History  Diagnosis Date  . Shingles   . Dehydration   . Hypertension   . Prostate cancer ([place])   . Hypercholesteremia   . 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 Insomnia   . Leukocytosis   . Autoimmune hemolytic anemia (HCC)   . Detached retina     PAST SURGICAL HISTORY :   Past Surgical History  Procedure Laterality Date  . Hernia repair    . Eye surgery    . Artificial left eye    . Joint replacement    . Prostate surgery      FAMILY HISTORY :   Family History  Problem Relation Age of Onset  . Cancer Mother   . Multiple sclerosis Father     SOCIAL HISTORY:   Social History  Substance Use Topics  . Smoking status: Never Smoker   .  Smokeless tobacco: Not on file  . Alcohol Use: No    ALLERGIES:  is allergic to cephalexin and hydrocodone-acetaminophen.  MEDICATIONS:  Current Outpatient Prescriptions  Medication Sig Dispense Refill  . citalopram (CELEXA) 10 MG tablet Take 10 mg by mouth daily.    . Cyanocobalamin (RA VITAMIN B-12 TR) 1000 MCG TBCR Take 1,000 mcg by mouth daily.    Marland Kitchen docusate sodium (COLACE) 100 MG capsule Take 1 capsule (100 mg total) by mouth 2 (two) times daily. 60 capsule 2  . feeding supplement, ENSURE ENLIVE, (ENSURE ENLIVE) LIQD Take 237 mLs by mouth 2 (two) times daily between meals. 237 mL 12  . gabapentin (NEURONTIN) 100 MG capsule Take 300 mg by mouth at bedtime. Reported on 12/03/2015    . latanoprost (XALATAN) 0.005 % ophthalmic solution Place 1 drop into the right eye at bedtime. At night    . lisinopril (PRINIVIL,ZESTRIL) 20 MG tablet Take 20 mg by mouth every morning.     Marland Kitchen omeprazole (PRILOSEC) 40 MG capsule TAKE ONE CAPSULE DAILY USUALLY THIRTY MINUTES BEFORE BREAKFAST 30 capsule 2  . polyethylene glycol (MIRALAX / GLYCOLAX) packet Take 17 g by mouth daily as needed for mild constipation. 14 each 0  . polyvinyl alcohol (LIQUIFILM TEARS) 1.4 % ophthalmic solution Place 2 drops into the left eye 3 (three) times daily as needed for dry eyes. 15 mL 0  .  pravastatin (PRAVACHOL) 20 MG tablet Take 20 mg by mouth at bedtime.     . predniSONE (DELTASONE) 20 MG tablet 4 tablets orally daily    . zolpidem (AMBIEN) 5 MG tablet TAKE ONE TABLET AT BEDTIME AS NEEDED FOR SLEEPLESSNESS (Patient taking differently: Take 5 mg by mouth at bedtime. ) 30 tablet 1  . acetaminophen (TYLENOL) 500 MG tablet Take 500 mg by mouth every 6 (six) hours as needed for mild pain. Reported on 01/24/2016    . nystatin (MYCOSTATIN) 100000 UNIT/ML suspension Take 5 mLs (500,000 Units total) by mouth 4 (four) times daily. (Patient not taking: Reported on 01/24/2016) 60 mL 0   No current facility-administered medications for this  visit.    PHYSICAL EXAMINATION:   BP 92/54 mmHg  Pulse 75  Temp(Src) 97.5 F (36.4 C) (Oral)  Ht 5' 6.3" (1.684 m)  Wt 169 lb 5 oz (76.8 kg)  BMI 27.08 kg/m2  SpO2 98%  Filed Weights   01/24/16 1009 01/24/16 1011  Weight: 169 lb 5 oz (76.8 kg) 169 lb 5 oz (76.8 kg)    GENERAL: Well-nourished well-developed; Alert, no distress and comfortable.  Accompanied by his wife. EYES: no pallor or icterus OROPHARYNX: no thrush or ulceration; good dentition no bleeding noted. NECK: supple, no masses felt LYMPH:  no palpable lymphadenopathy in the cervical, axillary or inguinal regions LUNGS: clear to auscultation and  No wheeze or crackles HEART/CVS: regular rate & rhythm and no murmurs; No lower extremity edema ABDOMEN:abdomen soft, non-tender and normal bowel sounds Musculoskeletal:no cyanosis of digits and no clubbing  PSYCH: alert & oriented x 3 with fluent speech NEURO: no focal motor/sensory deficits SKIN:  no rashes or significant lesions  LABORATORY DATA:  I have reviewed the data as listed    Component Value Date/Time   NA 136 01/09/2016 0534   NA 131* 03/17/2015 1425   K 3.7 01/09/2016 0534   K 3.9 03/17/2015 1425   CL 103 01/09/2016 0534   CL 101 03/17/2015 1425   CO2 28 01/09/2016 0534   CO2 26 03/17/2015 1425   GLUCOSE 128* 01/09/2016 0534   GLUCOSE 161* 03/17/2015 1425   BUN 20 01/09/2016 0534   BUN 17 03/17/2015 1425   CREATININE 0.65 01/09/2016 0534   CREATININE 0.90 03/17/2015 1425   CALCIUM 8.5* 01/09/2016 0534   CALCIUM 8.5* 03/17/2015 1425   PROT 6.9 09/01/2015 0957   PROT 6.7 03/17/2015 1425   ALBUMIN 3.9 09/01/2015 0957   ALBUMIN 3.7 03/17/2015 1425   AST 28 09/01/2015 0957   AST 25 03/17/2015 1425   ALT 21 09/01/2015 0957   ALT 23 03/17/2015 1425   ALKPHOS 65 09/01/2015 0957   ALKPHOS 44 03/17/2015 1425   BILITOT 0.5 09/01/2015 0957   BILITOT 0.6 03/17/2015 1425   GFRNONAA >60 01/09/2016 0534   GFRNONAA >60 03/17/2015 1425   GFRNONAA >60  12/09/2014 0431   GFRAA >60 01/09/2016 0534   GFRAA >60 03/17/2015 1425   GFRAA >60 12/09/2014 0431    No results found for: SPEP, UPEP  Lab Results  Component Value Date   WBC 8.4 01/24/2016   NEUTROABS 6.3 01/24/2016   HGB 11.6* 01/24/2016   HCT 33.8* 01/24/2016   MCV 94.5 01/24/2016   PLT 133* 01/24/2016      Chemistry      Component Value Date/Time   NA 136 01/09/2016 0534   NA 131* 03/17/2015 1425   K 3.7 01/09/2016 0534   K 3.9 03/17/2015 1425  CL 103 01/09/2016 0534   CL 101 03/17/2015 1425   CO2 28 01/09/2016 0534   CO2 26 03/17/2015 1425   BUN 20 01/09/2016 0534   BUN 17 03/17/2015 1425   CREATININE 0.65 01/09/2016 0534   CREATININE 0.90 03/17/2015 1425      Component Value Date/Time   CALCIUM 8.5* 01/09/2016 0534   CALCIUM 8.5* 03/17/2015 1425   ALKPHOS 65 09/01/2015 0957   ALKPHOS 44 03/17/2015 1425   AST 28 09/01/2015 0957   AST 25 03/17/2015 1425   ALT 21 09/01/2015 0957   ALT 23 03/17/2015 1425   BILITOT 0.5 09/01/2015 0957   BILITOT 0.6 03/17/2015 1425       ASSESSMENT & PLAN:   # ITP- steroid responsive; relapsed currently s/p Ritixan appx 4 week ago [February 8].  Patient needing high-dose of steroids anywhere 60-80 mg of prednisone. Status post recent IVIG in the hospital when the patient was sent down to 3/nosebleeds. Today platelets are 131.  I would recommend starting him on N-plate and the platelet count is less than 50.  # Recommend checking CBC Monday Friday; and when platelet count less than 50- start N plate.  # Prednisone currently 20 mg a day/I do not think patient's platelets will hold up on the current dose. We'll plan to start a taper of the prednisone; starting this Friday.  # 15 minutes face-to-face with the patient discussing the above plan of care; more than 50% of time spent on prognosis/ natural history; counseling and coordination.     [name], MD 01/24/2016 10:37 AM

## 2016-01-24 NOTE — Progress Notes (Signed)
Patient ambulates independently without assistance, brought to exam room 6.  Patient denies pain or discomfort, medication rec updated, information provided by patient and wife.

## 2016-01-28 ENCOUNTER — Inpatient Hospital Stay: Payer: PPO

## 2016-01-28 ENCOUNTER — Other Ambulatory Visit: Payer: Self-pay | Admitting: *Deleted

## 2016-01-28 ENCOUNTER — Other Ambulatory Visit: Payer: Self-pay | Admitting: Internal Medicine

## 2016-01-28 DIAGNOSIS — D693 Immune thrombocytopenic purpura: Secondary | ICD-10-CM

## 2016-01-28 LAB — CBC WITH DIFFERENTIAL/PLATELET
BASOS PCT: 1 %
Basophils Absolute: 0.1 10*3/uL (ref 0–0.1)
EOS ABS: 0.1 10*3/uL (ref 0–0.7)
EOS PCT: 1 %
HCT: 32.2 % — ABNORMAL LOW (ref 40.0–52.0)
HEMOGLOBIN: 11.2 g/dL — AB (ref 13.0–18.0)
Lymphocytes Relative: 14 %
Lymphs Abs: 1.2 10*3/uL (ref 1.0–3.6)
MCH: 33 pg (ref 26.0–34.0)
MCHC: 34.7 g/dL (ref 32.0–36.0)
MCV: 95.2 fL (ref 80.0–100.0)
Monocytes Absolute: 0.5 10*3/uL (ref 0.2–1.0)
Monocytes Relative: 7 %
NEUTROS PCT: 77 %
Neutro Abs: 6.3 10*3/uL (ref 1.4–6.5)
PLATELETS: 56 10*3/uL — AB (ref 150–440)
RBC: 3.39 MIL/uL — AB (ref 4.40–5.90)
RDW: 18.1 % — ABNORMAL HIGH (ref 11.5–14.5)
WBC: 8.1 10*3/uL (ref 3.8–10.6)

## 2016-01-28 MED ORDER — ROMIPLOSTIM 250 MCG ~~LOC~~ SOLR
1.0000 ug/kg | Freq: Once | SUBCUTANEOUS | Status: AC
  Administered 2016-01-28: 75 ug via SUBCUTANEOUS
  Filled 2016-01-28: qty 0.15

## 2016-01-28 NOTE — Patient Outreach (Signed)
Attempt made to contact pt as part of transition of care (week 3, discharged 2/20).  HIPPA compliant voice message left with contact number.  If no response, will try again.     Zara Chess.   [facility]  [phone]

## 2016-01-31 ENCOUNTER — Inpatient Hospital Stay: Payer: PPO

## 2016-01-31 ENCOUNTER — Telehealth: Payer: Self-pay | Admitting: *Deleted

## 2016-01-31 ENCOUNTER — Other Ambulatory Visit: Payer: Self-pay | Admitting: *Deleted

## 2016-01-31 DIAGNOSIS — D693 Immune thrombocytopenic purpura: Secondary | ICD-10-CM | POA: Diagnosis not present

## 2016-01-31 LAB — CBC WITH DIFFERENTIAL/PLATELET
BASOS ABS: 0 10*3/uL (ref 0–0.1)
BASOS PCT: 0 %
EOS ABS: 0 10*3/uL (ref 0–0.7)
Eosinophils Relative: 0 %
HCT: 31.6 % — ABNORMAL LOW (ref 40.0–52.0)
HEMOGLOBIN: 10.9 g/dL — AB (ref 13.0–18.0)
Lymphocytes Relative: 13 %
Lymphs Abs: 1 10*3/uL (ref 1.0–3.6)
MCH: 33 pg (ref 26.0–34.0)
MCHC: 34.5 g/dL (ref 32.0–36.0)
MCV: 95.7 fL (ref 80.0–100.0)
Monocytes Absolute: 0.3 10*3/uL (ref 0.2–1.0)
Monocytes Relative: 3 %
NEUTROS PCT: 84 %
Neutro Abs: 6.4 10*3/uL (ref 1.4–6.5)
PLATELETS: 84 10*3/uL — AB (ref 150–440)
RBC: 3.3 MIL/uL — AB (ref 4.40–5.90)
RDW: 18.5 % — AB (ref 11.5–14.5)
WBC: 7.7 10*3/uL (ref 3.8–10.6)

## 2016-01-31 MED ORDER — OMEPRAZOLE 40 MG PO CPDR: DELAYED_RELEASE_CAPSULE | ORAL | Status: DC

## 2016-01-31 NOTE — Telephone Encounter (Signed)
Spoke with pt's wife. Instructions provided.Teach back process performed with patient and pt's wife. Pt will take prednisone to 10 mg once a day for 7 days; then stop prednisone as directed by md.  Pt already has an apt for lab/NPLATE inj on Friday.

## 2016-01-31 NOTE — Telephone Encounter (Signed)
-----   Message from [name], MD sent at 01/31/2016  4:52 PM EDT ----- Cut down the prednisone to 10 mg once a day for 7 days; then stop prednisone. Continue lab check Friday/possible N plate at that time. Please inform pt. Thx

## 2016-02-04 ENCOUNTER — Other Ambulatory Visit: Payer: PPO

## 2016-02-04 ENCOUNTER — Inpatient Hospital Stay: Payer: PPO

## 2016-02-04 ENCOUNTER — Other Ambulatory Visit: Payer: Self-pay | Admitting: *Deleted

## 2016-02-04 DIAGNOSIS — D693 Immune thrombocytopenic purpura: Secondary | ICD-10-CM

## 2016-02-04 LAB — CBC WITH DIFFERENTIAL/PLATELET
BASOS ABS: 0 10*3/uL (ref 0–0.1)
BASOS PCT: 0 %
EOS ABS: 0 10*3/uL (ref 0–0.7)
EOS PCT: 0 %
HCT: 32.2 % — ABNORMAL LOW (ref 40.0–52.0)
Hemoglobin: 11.2 g/dL — ABNORMAL LOW (ref 13.0–18.0)
Lymphocytes Relative: 15 %
Lymphs Abs: 1.1 10*3/uL (ref 1.0–3.6)
MCH: 33.2 pg (ref 26.0–34.0)
MCHC: 34.8 g/dL (ref 32.0–36.0)
MCV: 95.4 fL (ref 80.0–100.0)
Monocytes Absolute: 0.3 10*3/uL (ref 0.2–1.0)
Monocytes Relative: 4 %
Neutro Abs: 6.2 10*3/uL (ref 1.4–6.5)
Neutrophils Relative %: 81 %
PLATELETS: 237 10*3/uL (ref 150–440)
RBC: 3.37 MIL/uL — AB (ref 4.40–5.90)
RDW: 17.8 % — ABNORMAL HIGH (ref 11.5–14.5)
WBC: 7.7 10*3/uL (ref 3.8–10.6)

## 2016-02-04 MED ORDER — ROMIPLOSTIM 250 MCG ~~LOC~~ SOLR
1.0000 ug/kg | Freq: Once | SUBCUTANEOUS | Status: AC
Start: 2016-02-04 — End: 2016-02-04
  Administered 2016-02-04: 75 ug via SUBCUTANEOUS
  Filled 2016-02-04: qty 0.15

## 2016-02-04 NOTE — Patient Outreach (Signed)
Transition of care call (week 4, discharged 2/20).  Pt reports doing good since discharge, goes back and forth to the hospital twice a week (lab work).   Discussed with pt scheduling a home visit (part of transition of care program) to which pt did not feel necessary- doing  frequent visits to the hospital.   As discussed with pt, plan to f/u again telephonically 3/23- final transition of care call to which pt agreed.   Zara Chess.   [facility] Management  [phone]

## 2016-02-07 ENCOUNTER — Inpatient Hospital Stay: Payer: PPO

## 2016-02-07 DIAGNOSIS — D693 Immune thrombocytopenic purpura: Secondary | ICD-10-CM

## 2016-02-07 LAB — CBC WITH DIFFERENTIAL/PLATELET
BASOS ABS: 0 10*3/uL (ref 0–0.1)
BASOS PCT: 1 %
EOS PCT: 2 %
Eosinophils Absolute: 0.1 10*3/uL (ref 0–0.7)
HCT: 32.1 % — ABNORMAL LOW (ref 40.0–52.0)
Hemoglobin: 11.1 g/dL — ABNORMAL LOW (ref 13.0–18.0)
Lymphocytes Relative: 20 %
Lymphs Abs: 1.5 10*3/uL (ref 1.0–3.6)
MCH: 33.1 pg (ref 26.0–34.0)
MCHC: 34.5 g/dL (ref 32.0–36.0)
MCV: 96 fL (ref 80.0–100.0)
MONO ABS: 0.8 10*3/uL (ref 0.2–1.0)
Monocytes Relative: 10 %
Neutro Abs: 5.3 10*3/uL (ref 1.4–6.5)
Neutrophils Relative %: 67 %
Platelets: 372 10*3/uL (ref 150–440)
RBC: 3.35 MIL/uL — ABNORMAL LOW (ref 4.40–5.90)
RDW: 17.7 % — AB (ref 11.5–14.5)
WBC: 7.8 10*3/uL (ref 3.8–10.6)

## 2016-02-08 DIAGNOSIS — H40051 Ocular hypertension, right eye: Secondary | ICD-10-CM | POA: Diagnosis not present

## 2016-02-08 DIAGNOSIS — H40043 Steroid responder, bilateral: Secondary | ICD-10-CM | POA: Diagnosis not present

## 2016-02-09 ENCOUNTER — Telehealth: Payer: Self-pay | Admitting: *Deleted

## 2016-02-09 NOTE — Telephone Encounter (Signed)
Pt's wife called x 2. Left vm on clinical hotline requesting lab results and new instructions on prednisone.  plt count 372 on Monday.  I spoke with Dr. Rogue Bussing. Verbal order given to RN to have pt stop prednisone. Pt to keep all upcoming lab checks. Teach back process performed with pt's wife. Results provided to pt/pt wife.

## 2016-02-10 ENCOUNTER — Other Ambulatory Visit: Payer: Self-pay | Admitting: *Deleted

## 2016-02-10 LAB — TYPE AND SCREEN
ABO/RH(D): A POS
Antibody Screen: POSITIVE
DAT, IgG: POSITIVE
DAT, complement: POSITIVE

## 2016-02-10 NOTE — Patient Outreach (Signed)
  Attempt for final  transition of care call (pt discharged 2/20).   Spouse answered, reports pt is not available, HIPPA verified.  Provided spouse with RN CM's name, contact number with request to have pt return call.    Wait for pt to return call.    [name].   [facility]  [phone]

## 2016-02-11 ENCOUNTER — Telehealth: Payer: Self-pay | Admitting: *Deleted

## 2016-02-11 ENCOUNTER — Inpatient Hospital Stay: Payer: PPO

## 2016-02-11 ENCOUNTER — Encounter: Payer: Self-pay | Admitting: *Deleted

## 2016-02-11 ENCOUNTER — Other Ambulatory Visit: Payer: Self-pay | Admitting: *Deleted

## 2016-02-11 DIAGNOSIS — D693 Immune thrombocytopenic purpura: Secondary | ICD-10-CM | POA: Diagnosis not present

## 2016-02-11 LAB — CBC WITH DIFFERENTIAL/PLATELET
Basophils Absolute: 0 10*3/uL (ref 0–0.1)
Basophils Relative: 1 %
EOS PCT: 2 %
Eosinophils Absolute: 0.1 10*3/uL (ref 0–0.7)
HEMATOCRIT: 30.8 % — AB (ref 40.0–52.0)
HEMOGLOBIN: 10.5 g/dL — AB (ref 13.0–18.0)
LYMPHS ABS: 1.8 10*3/uL (ref 1.0–3.6)
LYMPHS PCT: 26 %
MCH: 32.5 pg (ref 26.0–34.0)
MCHC: 34.1 g/dL (ref 32.0–36.0)
MCV: 95.4 fL (ref 80.0–100.0)
Monocytes Absolute: 0.7 10*3/uL (ref 0.2–1.0)
Monocytes Relative: 10 %
NEUTROS PCT: 61 %
Neutro Abs: 4.2 10*3/uL (ref 1.4–6.5)
Platelets: 383 10*3/uL (ref 150–440)
RBC: 3.23 MIL/uL — AB (ref 4.40–5.90)
RDW: 17.1 % — ABNORMAL HIGH (ref 11.5–14.5)
WBC: 6.9 10*3/uL (ref 3.8–10.6)

## 2016-02-11 NOTE — Telephone Encounter (Signed)
Pt came in today and did not need n plate and they were wondering if on Monday when they come in and get cbc, could he change his appt to Thursday for the poss. n plate or cancel if you thought that it would be ok because they would like to go to the beach on Thursday because [place].  I told them i would check with you first and then let them know on Monday after lab results come back

## 2016-02-11 NOTE — Patient Outreach (Signed)
Returned phone call to pt to one placed today (no voice message left).  Final transition of care call-  Spoke with pt who  reports doing good.  Pt reports he received a platelet shot 3/17 and it worked- platelets are up now.   Pt reports a good appetite, no falls.   As discussed, pt reports no further community nurse case management needs.  RN CM to close case.   Plan to send fax case closure letter in Epic to  Dr. Baldemar Lenis. Plan to inform [facility] care management assistant of case closure.     Zara Chess.   [facility]  [phone]

## 2016-02-14 ENCOUNTER — Telehealth: Payer: Self-pay | Admitting: *Deleted

## 2016-02-14 ENCOUNTER — Inpatient Hospital Stay: Payer: PPO

## 2016-02-14 DIAGNOSIS — D693 Immune thrombocytopenic purpura: Secondary | ICD-10-CM

## 2016-02-14 LAB — CBC WITH DIFFERENTIAL/PLATELET
BASOS PCT: 1 %
Basophils Absolute: 0.1 10*3/uL (ref 0–0.1)
EOS ABS: 0.1 10*3/uL (ref 0–0.7)
EOS PCT: 2 %
HCT: 31.6 % — ABNORMAL LOW (ref 40.0–52.0)
Hemoglobin: 10.8 g/dL — ABNORMAL LOW (ref 13.0–18.0)
LYMPHS ABS: 1.7 10*3/uL (ref 1.0–3.6)
Lymphocytes Relative: 26 %
MCH: 32.8 pg (ref 26.0–34.0)
MCHC: 34.2 g/dL (ref 32.0–36.0)
MCV: 96 fL (ref 80.0–100.0)
MONO ABS: 0.7 10*3/uL (ref 0.2–1.0)
MONOS PCT: 11 %
Neutro Abs: 4.1 10*3/uL (ref 1.4–6.5)
Neutrophils Relative %: 60 %
PLATELETS: 412 10*3/uL (ref 150–440)
RBC: 3.29 MIL/uL — ABNORMAL LOW (ref 4.40–5.90)
RDW: 17 % — AB (ref 11.5–14.5)
WBC: 6.7 10*3/uL (ref 3.8–10.6)

## 2016-02-14 NOTE — Telephone Encounter (Signed)
Left msg. plt count 412 today. Pt plans on going to beach trip this weekend and wants to leave on Thursday. For this reason, he will be unable to keep his apt for lab/possible NPLATE on Friday.  I spoke with Dr. Rogue Bussing. Ok to cnl the lab/inj apt on Friday. Have pt keep apt with lab/md/possible NPLATE on Monday [id number]. Pt's wife called back at 43. This information was verbally reviewed with patient's wife.

## 2016-02-18 ENCOUNTER — Ambulatory Visit: Payer: PPO

## 2016-02-18 ENCOUNTER — Inpatient Hospital Stay: Payer: PPO

## 2016-02-21 ENCOUNTER — Inpatient Hospital Stay: Payer: PPO | Attending: Internal Medicine

## 2016-02-21 ENCOUNTER — Inpatient Hospital Stay: Payer: PPO

## 2016-02-21 ENCOUNTER — Inpatient Hospital Stay (HOSPITAL_BASED_OUTPATIENT_CLINIC_OR_DEPARTMENT_OTHER): Payer: PPO | Admitting: Internal Medicine

## 2016-02-21 VITALS — BP 114/66 | HR 75 | Temp 98.1°F | Resp 18 | Wt 174.8 lb

## 2016-02-21 DIAGNOSIS — Z79899 Other long term (current) drug therapy: Secondary | ICD-10-CM

## 2016-02-21 DIAGNOSIS — Z7952 Long term (current) use of systemic steroids: Secondary | ICD-10-CM | POA: Diagnosis not present

## 2016-02-21 DIAGNOSIS — Z8546 Personal history of malignant neoplasm of prostate: Secondary | ICD-10-CM

## 2016-02-21 DIAGNOSIS — G4700 Insomnia, unspecified: Secondary | ICD-10-CM

## 2016-02-21 DIAGNOSIS — I10 Essential (primary) hypertension: Secondary | ICD-10-CM

## 2016-02-21 DIAGNOSIS — D591 Other autoimmune hemolytic anemias: Secondary | ICD-10-CM

## 2016-02-21 DIAGNOSIS — R5383 Other fatigue: Secondary | ICD-10-CM

## 2016-02-21 DIAGNOSIS — E7800 Pure hypercholesterolemia, unspecified: Secondary | ICD-10-CM | POA: Insufficient documentation

## 2016-02-21 DIAGNOSIS — D693 Immune thrombocytopenic purpura: Secondary | ICD-10-CM | POA: Insufficient documentation

## 2016-02-21 LAB — CBC WITH DIFFERENTIAL/PLATELET
BASOS ABS: 0 10*3/uL (ref 0–0.1)
BASOS PCT: 1 %
EOS ABS: 0.2 10*3/uL (ref 0–0.7)
Eosinophils Relative: 2 %
HCT: 31 % — ABNORMAL LOW (ref 40.0–52.0)
HEMOGLOBIN: 10.5 g/dL — AB (ref 13.0–18.0)
Lymphocytes Relative: 35 %
Lymphs Abs: 2.2 10*3/uL (ref 1.0–3.6)
MCH: 32.7 pg (ref 26.0–34.0)
MCHC: 34 g/dL (ref 32.0–36.0)
MCV: 96.3 fL (ref 80.0–100.0)
Monocytes Absolute: 0.9 10*3/uL (ref 0.2–1.0)
Monocytes Relative: 14 %
NEUTROS PCT: 48 %
Neutro Abs: 3 10*3/uL (ref 1.4–6.5)
PLATELETS: 205 10*3/uL (ref 150–440)
RBC: 3.22 MIL/uL — AB (ref 4.40–5.90)
RDW: 16.7 % — ABNORMAL HIGH (ref 11.5–14.5)
WBC: 6.3 10*3/uL (ref 3.8–10.6)

## 2016-02-21 NOTE — Progress Notes (Signed)
[place] OFFICE PROGRESS NOTE  Patient Care Team: Derinda Late, MD as PCP - General (Family Medicine)   SUMMARY OF ONCOLOGIC HISTORY:  #FEB 2016-  ITP & AUTOIMMUNE HEMOLYTIC ANEMIA s/p Prednisone; OCT 2016- Relapse of ITP- Prednisone; JAN 13th- START RITUXAN q W x4 [finished Feb 8th]; March 10th 2017- N-plate q W  # Hx of nose bleeds  INTERVAL HISTORY:  [name] with above history of autoimmune hemolytic anemia along with ITP- currently relapsed in October 2016 on prednisone is here for follow-up. Most recently he is on N-plate.  In the interim no admission the hospital no history of any significant nosebleeds. He does know to have spontaneous ecchymosis of his left upper extremity. He does complain of fatigue especially after playing golf. He had recently spent his weekend on the beach with his wife.  Patient stopped his steroids approximately 2 weeks ago. Otherwise no nausea no vomiting. No dizziness. No difficulty swallowing or pain while swallowing.    REVIEW OF SYSTEMS:  A complete 10 point review of system is done which is negative except mentioned above/history of present illness.   PAST MEDICAL HISTORY :  Past Medical History  Diagnosis Date  . Shingles   . Dehydration   . Hypertension   . Prostate cancer ([place])   . Hypercholesteremia   . 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 Insomnia   . Leukocytosis   . Autoimmune hemolytic anemia ([facility])   . Detached retina     PAST SURGICAL HISTORY :   Past Surgical History  Procedure Laterality Date  . Hernia repair    . Eye surgery    . Artificial left eye    . Joint replacement    . Prostate surgery      FAMILY HISTORY :   Family History  Problem Relation Age of Onset  . Cancer Mother   . Multiple sclerosis Father     SOCIAL HISTORY:   Social History  Substance Use Topics  . Smoking status: Never Smoker   . Smokeless tobacco: Not on file  .  Alcohol Use: No    ALLERGIES:  is allergic to cephalexin and hydrocodone-acetaminophen.  MEDICATIONS:  Current Outpatient Prescriptions  Medication Sig Dispense Refill  . acetaminophen (TYLENOL) 500 MG tablet Take 500 mg by mouth every 6 (six) hours as needed for mild pain. Reported on 01/24/2016    . citalopram (CELEXA) 10 MG tablet Take 10 mg by mouth daily.    . Cyanocobalamin (RA VITAMIN B-12 TR) 1000 MCG TBCR Take 1,000 mcg by mouth daily.    Marland Kitchen docusate sodium (COLACE) 100 MG capsule Take 1 capsule (100 mg total) by mouth 2 (two) times daily. 60 capsule 2  . feeding supplement, ENSURE ENLIVE, (ENSURE ENLIVE) LIQD Take 237 mLs by mouth 2 (two) times daily between meals. 237 mL 12  . gabapentin (NEURONTIN) 100 MG capsule Take 300 mg by mouth at bedtime. Reported on 12/03/2015    . latanoprost (XALATAN) 0.005 % ophthalmic solution Place 1 drop into the right eye at bedtime. At night    . lisinopril (PRINIVIL,ZESTRIL) 20 MG tablet Take 20 mg by mouth every morning.     . nystatin (MYCOSTATIN) 100000 UNIT/ML suspension Take 5 mLs (500,000 Units total) by mouth 4 (four) times daily. 60 mL 0  . omeprazole (PRILOSEC) 40 MG capsule TAKE ONE CAPSULE DAILY USUALLY THIRTY MINUTES BEFORE BREAKFAST 30 capsule 2  . polyethylene glycol (MIRALAX / GLYCOLAX) packet Take  17 g by mouth daily as needed for mild constipation. 14 each 0  . polyvinyl alcohol (LIQUIFILM TEARS) 1.4 % ophthalmic solution Place 2 drops into the left eye 3 (three) times daily as needed for dry eyes. 15 mL 0  . pravastatin (PRAVACHOL) 20 MG tablet Take 20 mg by mouth at bedtime.     . predniSONE (DELTASONE) 20 MG tablet 4 tablets orally daily    . zolpidem (AMBIEN) 5 MG tablet TAKE ONE TABLET AT BEDTIME AS NEEDED FOR SLEEPLESSNESS (Patient taking differently: Take 5 mg by mouth at bedtime. ) 30 tablet 1   No current facility-administered medications for this visit.    PHYSICAL EXAMINATION:   There were no vitals taken for this  visit.  There were no vitals filed for this visit.  GENERAL: Well-nourished well-developed; Alert, no distress and comfortable.  Accompanied by his wife. EYES: no pallor or icterus OROPHARYNX: no thrush or ulceration; good dentition no bleeding noted. NECK: supple, no masses felt LYMPH:  no palpable lymphadenopathy in the cervical, axillary or inguinal regions LUNGS: clear to auscultation and  No wheeze or crackles HEART/CVS: regular rate & rhythm and no murmurs; No lower extremity edema ABDOMEN:abdomen soft, non-tender and normal bowel sounds Musculoskeletal:no cyanosis of digits and no clubbing  PSYCH: alert & oriented x 3 with fluent speech NEURO: no focal motor/sensory deficits SKIN:  no rashes or significant lesions  LABORATORY DATA:  I have reviewed the data as listed    Component Value Date/Time   NA 136 01/09/2016 0534   NA 131* 03/17/2015 1425   K 3.7 01/09/2016 0534   K 3.9 03/17/2015 1425   CL 103 01/09/2016 0534   CL 101 03/17/2015 1425   CO2 28 01/09/2016 0534   CO2 26 03/17/2015 1425   GLUCOSE 128* 01/09/2016 0534   GLUCOSE 161* 03/17/2015 1425   BUN 20 01/09/2016 0534   BUN 17 03/17/2015 1425   CREATININE 0.65 01/09/2016 0534   CREATININE 0.90 03/17/2015 1425   CALCIUM 8.5* 01/09/2016 0534   CALCIUM 8.5* 03/17/2015 1425   PROT 6.9 09/01/2015 0957   PROT 6.7 03/17/2015 1425   ALBUMIN 3.9 09/01/2015 0957   ALBUMIN 3.7 03/17/2015 1425   AST 28 09/01/2015 0957   AST 25 03/17/2015 1425   ALT 21 09/01/2015 0957   ALT 23 03/17/2015 1425   ALKPHOS 65 09/01/2015 0957   ALKPHOS 44 03/17/2015 1425   BILITOT 0.5 09/01/2015 0957   BILITOT 0.6 03/17/2015 1425   GFRNONAA >60 01/09/2016 0534   GFRNONAA >60 03/17/2015 1425   GFRNONAA >60 12/09/2014 0431   GFRAA >60 01/09/2016 0534   GFRAA >60 03/17/2015 1425   GFRAA >60 12/09/2014 0431    No results found for: SPEP, UPEP  Lab Results  Component Value Date   WBC 6.3 02/21/2016   NEUTROABS 3.0 02/21/2016    HGB 10.5* 02/21/2016   HCT 31.0* 02/21/2016   MCV 96.3 02/21/2016   PLT 205 02/21/2016      Chemistry      Component Value Date/Time   NA 136 01/09/2016 0534   NA 131* 03/17/2015 1425   K 3.7 01/09/2016 0534   K 3.9 03/17/2015 1425   CL 103 01/09/2016 0534   CL 101 03/17/2015 1425   CO2 28 01/09/2016 0534   CO2 26 03/17/2015 1425   BUN 20 01/09/2016 0534   BUN 17 03/17/2015 1425   CREATININE 0.65 01/09/2016 0534   CREATININE 0.90 03/17/2015 1425      Component  Value Date/Time   CALCIUM 8.5* 01/09/2016 0534   CALCIUM 8.5* 03/17/2015 1425   ALKPHOS 65 09/01/2015 0957   ALKPHOS 44 03/17/2015 1425   AST 28 09/01/2015 0957   AST 25 03/17/2015 1425   ALT 21 09/01/2015 0957   ALT 23 03/17/2015 1425   BILITOT 0.5 09/01/2015 0957   BILITOT 0.6 03/17/2015 1425       ASSESSMENT & PLAN:   # ITP- steroid responsive; relapsed currently s/p Ritixan appx 4 week ago [February 8]. Currently on third line therapy with N-plate; Patient's recent platelets 1 week ago was 400; today they are 205. As per the recommendations hold N-plate today. Reevaluate next week. Discussed with pharmacy.  # Tiredness fatigue- likely from withdrawal of the steroids;  no obvious evidence of any orthostasis suggestive of adrenal insufficiency.  # Anemia hemoglobin 10.5 history of hemolytic anemia. Stable.  # Patient will have weekly labs on Mondays/plan treatment the same day. Follow-up with me in 3 months.  # 15 minutes face-to-face with the patient discussing the above plan of care; more than 50% of time spent on prognosis/ natural history; counseling and coordination.     [name], MD 02/21/2016 2:51 PM

## 2016-02-21 NOTE — Progress Notes (Signed)
Patient states she is having fatigue and weakness.  Also c/o bilateral knee pain.

## 2016-02-25 ENCOUNTER — Telehealth: Payer: Self-pay | Admitting: *Deleted

## 2016-02-25 DIAGNOSIS — R600 Localized edema: Secondary | ICD-10-CM | POA: Diagnosis not present

## 2016-02-25 NOTE — Telephone Encounter (Signed)
Pt's wife contacted cancer center. Pt reports edema in lower extremities.  "Wants to know what to do."  I contacted pt's wife. Left voice mail. Pt needs to see pcp.

## 2016-02-28 ENCOUNTER — Inpatient Hospital Stay: Payer: PPO

## 2016-02-28 DIAGNOSIS — D693 Immune thrombocytopenic purpura: Secondary | ICD-10-CM | POA: Diagnosis not present

## 2016-02-28 LAB — CBC WITH DIFFERENTIAL/PLATELET
BASOS ABS: 0 10*3/uL (ref 0–0.1)
BASOS PCT: 1 %
Eosinophils Absolute: 0.2 10*3/uL (ref 0–0.7)
Eosinophils Relative: 2 %
HEMATOCRIT: 32.3 % — AB (ref 40.0–52.0)
Hemoglobin: 10.9 g/dL — ABNORMAL LOW (ref 13.0–18.0)
LYMPHS PCT: 29 %
Lymphs Abs: 1.9 10*3/uL (ref 1.0–3.6)
MCH: 32.6 pg (ref 26.0–34.0)
MCHC: 33.7 g/dL (ref 32.0–36.0)
MCV: 96.5 fL (ref 80.0–100.0)
Monocytes Absolute: 0.7 10*3/uL (ref 0.2–1.0)
Monocytes Relative: 11 %
NEUTROS ABS: 3.8 10*3/uL (ref 1.4–6.5)
NEUTROS PCT: 57 %
Platelets: 144 10*3/uL — ABNORMAL LOW (ref 150–440)
RBC: 3.35 MIL/uL — AB (ref 4.40–5.90)
RDW: 16.1 % — ABNORMAL HIGH (ref 11.5–14.5)
WBC: 6.6 10*3/uL (ref 3.8–10.6)

## 2016-03-06 ENCOUNTER — Inpatient Hospital Stay: Payer: PPO

## 2016-03-06 DIAGNOSIS — D693 Immune thrombocytopenic purpura: Secondary | ICD-10-CM | POA: Diagnosis not present

## 2016-03-06 LAB — CBC WITH DIFFERENTIAL/PLATELET
Basophils Absolute: 0.1 10*3/uL (ref 0–0.1)
Basophils Relative: 1 %
EOS ABS: 0.2 10*3/uL (ref 0–0.7)
Eosinophils Relative: 3 %
HCT: 31.5 % — ABNORMAL LOW (ref 40.0–52.0)
Hemoglobin: 10.6 g/dL — ABNORMAL LOW (ref 13.0–18.0)
LYMPHS ABS: 1.9 10*3/uL (ref 1.0–3.6)
LYMPHS PCT: 24 %
MCH: 32.9 pg (ref 26.0–34.0)
MCHC: 33.7 g/dL (ref 32.0–36.0)
MCV: 97.7 fL (ref 80.0–100.0)
Monocytes Absolute: 0.8 10*3/uL (ref 0.2–1.0)
Monocytes Relative: 11 %
NEUTROS ABS: 4.7 10*3/uL (ref 1.4–6.5)
NEUTROS PCT: 61 %
PLATELETS: 164 10*3/uL (ref 150–440)
RBC: 3.22 MIL/uL — AB (ref 4.40–5.90)
RDW: 15.9 % — AB (ref 11.5–14.5)
WBC: 7.7 10*3/uL (ref 3.8–10.6)

## 2016-03-13 ENCOUNTER — Inpatient Hospital Stay: Payer: PPO

## 2016-03-13 ENCOUNTER — Other Ambulatory Visit: Payer: Self-pay | Admitting: Internal Medicine

## 2016-03-13 ENCOUNTER — Telehealth: Payer: Self-pay | Admitting: Pharmacist

## 2016-03-13 DIAGNOSIS — D693 Immune thrombocytopenic purpura: Secondary | ICD-10-CM

## 2016-03-13 LAB — CBC WITH DIFFERENTIAL/PLATELET
BASOS PCT: 1 %
Basophils Absolute: 0 10*3/uL (ref 0–0.1)
EOS ABS: 0.3 10*3/uL (ref 0–0.7)
EOS PCT: 5 %
HCT: 31.9 % — ABNORMAL LOW (ref 40.0–52.0)
Hemoglobin: 10.7 g/dL — ABNORMAL LOW (ref 13.0–18.0)
LYMPHS ABS: 2.2 10*3/uL (ref 1.0–3.6)
Lymphocytes Relative: 33 %
MCH: 33.2 pg (ref 26.0–34.0)
MCHC: 33.6 g/dL (ref 32.0–36.0)
MCV: 98.8 fL (ref 80.0–100.0)
Monocytes Absolute: 0.7 10*3/uL (ref 0.2–1.0)
Monocytes Relative: 11 %
Neutro Abs: 3.4 10*3/uL (ref 1.4–6.5)
Neutrophils Relative %: 50 %
Platelets: 146 10*3/uL — ABNORMAL LOW (ref 150–440)
RBC: 3.23 MIL/uL — AB (ref 4.40–5.90)
RDW: 15.3 % — ABNORMAL HIGH (ref 11.5–14.5)
WBC: 6.8 10*3/uL (ref 3.8–10.6)

## 2016-03-13 MED ORDER — ROMIPLOSTIM 250 MCG ~~LOC~~ SOLR
75.0000 ug | Freq: Once | SUBCUTANEOUS | Status: AC
  Administered 2016-03-13: 75 ug via SUBCUTANEOUS
  Filled 2016-03-13: qty 0.15

## 2016-03-13 NOTE — Telephone Encounter (Signed)
Platelets were 146 today. Per [place], continue with 55mcg/kg Nplate today.

## 2016-03-20 ENCOUNTER — Inpatient Hospital Stay: Payer: PPO | Attending: Internal Medicine

## 2016-03-20 ENCOUNTER — Other Ambulatory Visit: Payer: Self-pay | Admitting: Internal Medicine

## 2016-03-20 ENCOUNTER — Inpatient Hospital Stay: Payer: PPO

## 2016-03-20 DIAGNOSIS — D693 Immune thrombocytopenic purpura: Secondary | ICD-10-CM

## 2016-03-20 DIAGNOSIS — Z79899 Other long term (current) drug therapy: Secondary | ICD-10-CM | POA: Diagnosis not present

## 2016-03-20 LAB — CBC WITH DIFFERENTIAL/PLATELET
BASOS ABS: 0 10*3/uL (ref 0–0.1)
BASOS PCT: 1 %
EOS ABS: 0.5 10*3/uL (ref 0–0.7)
EOS PCT: 8 %
HCT: 29.9 % — ABNORMAL LOW (ref 40.0–52.0)
Hemoglobin: 10.3 g/dL — ABNORMAL LOW (ref 13.0–18.0)
LYMPHS PCT: 26 %
Lymphs Abs: 1.7 10*3/uL (ref 1.0–3.6)
MCH: 33.8 pg (ref 26.0–34.0)
MCHC: 34.5 g/dL (ref 32.0–36.0)
MCV: 98.1 fL (ref 80.0–100.0)
Monocytes Absolute: 0.8 10*3/uL (ref 0.2–1.0)
Monocytes Relative: 12 %
Neutro Abs: 3.6 10*3/uL (ref 1.4–6.5)
Neutrophils Relative %: 53 %
PLATELETS: 167 10*3/uL (ref 150–440)
RBC: 3.05 MIL/uL — AB (ref 4.40–5.90)
RDW: 15.5 % — AB (ref 11.5–14.5)
WBC: 6.6 10*3/uL (ref 3.8–10.6)

## 2016-03-20 MED ORDER — ROMIPLOSTIM 250 MCG ~~LOC~~ SOLR
75.0000 ug | Freq: Once | SUBCUTANEOUS | Status: AC
  Administered 2016-03-20: 75 ug via SUBCUTANEOUS
  Filled 2016-03-20: qty 0.15

## 2016-03-27 ENCOUNTER — Inpatient Hospital Stay: Payer: PPO

## 2016-03-27 ENCOUNTER — Other Ambulatory Visit: Payer: Self-pay | Admitting: Internal Medicine

## 2016-03-27 DIAGNOSIS — D693 Immune thrombocytopenic purpura: Secondary | ICD-10-CM

## 2016-03-27 LAB — CBC WITH DIFFERENTIAL/PLATELET
BASOS ABS: 0 10*3/uL (ref 0–0.1)
BASOS PCT: 1 %
EOS ABS: 0.4 10*3/uL (ref 0–0.7)
EOS PCT: 6 %
HEMATOCRIT: 30.4 % — AB (ref 40.0–52.0)
Hemoglobin: 10.5 g/dL — ABNORMAL LOW (ref 13.0–18.0)
Lymphocytes Relative: 21 %
Lymphs Abs: 1.6 10*3/uL (ref 1.0–3.6)
MCH: 34 pg (ref 26.0–34.0)
MCHC: 34.4 g/dL (ref 32.0–36.0)
MCV: 98.6 fL (ref 80.0–100.0)
MONO ABS: 0.7 10*3/uL (ref 0.2–1.0)
MONOS PCT: 9 %
Neutro Abs: 4.8 10*3/uL (ref 1.4–6.5)
Neutrophils Relative %: 63 %
PLATELETS: 274 10*3/uL (ref 150–440)
RBC: 3.08 MIL/uL — ABNORMAL LOW (ref 4.40–5.90)
RDW: 15.1 % — AB (ref 11.5–14.5)
WBC: 7.6 10*3/uL (ref 3.8–10.6)

## 2016-03-27 MED ORDER — ROMIPLOSTIM 250 MCG ~~LOC~~ SOLR
75.0000 ug | Freq: Once | SUBCUTANEOUS | Status: AC
  Administered 2016-03-27: 75 ug via SUBCUTANEOUS
  Filled 2016-03-27: qty 0.15

## 2016-03-30 DIAGNOSIS — E7800 Pure hypercholesterolemia, unspecified: Secondary | ICD-10-CM | POA: Diagnosis not present

## 2016-03-30 DIAGNOSIS — Z79899 Other long term (current) drug therapy: Secondary | ICD-10-CM | POA: Diagnosis not present

## 2016-04-03 ENCOUNTER — Inpatient Hospital Stay: Payer: PPO

## 2016-04-03 VITALS — BP 108/60 | HR 80 | Temp 97.0°F | Resp 18

## 2016-04-03 DIAGNOSIS — D693 Immune thrombocytopenic purpura: Secondary | ICD-10-CM | POA: Diagnosis not present

## 2016-04-03 LAB — CBC WITH DIFFERENTIAL/PLATELET
BASOS ABS: 0 10*3/uL (ref 0–0.1)
BASOS PCT: 1 %
EOS PCT: 7 %
Eosinophils Absolute: 0.4 10*3/uL (ref 0–0.7)
HCT: 29.5 % — ABNORMAL LOW (ref 40.0–52.0)
Hemoglobin: 10.3 g/dL — ABNORMAL LOW (ref 13.0–18.0)
Lymphocytes Relative: 30 %
Lymphs Abs: 2.1 10*3/uL (ref 1.0–3.6)
MCH: 34.5 pg — ABNORMAL HIGH (ref 26.0–34.0)
MCHC: 34.8 g/dL (ref 32.0–36.0)
MCV: 99 fL (ref 80.0–100.0)
MONO ABS: 0.8 10*3/uL (ref 0.2–1.0)
Monocytes Relative: 12 %
Neutro Abs: 3.5 10*3/uL (ref 1.4–6.5)
Neutrophils Relative %: 50 %
PLATELETS: 319 10*3/uL (ref 150–440)
RBC: 2.98 MIL/uL — ABNORMAL LOW (ref 4.40–5.90)
RDW: 15.4 % — AB (ref 11.5–14.5)
WBC: 6.9 10*3/uL (ref 3.8–10.6)

## 2016-04-03 MED ORDER — ROMIPLOSTIM 250 MCG ~~LOC~~ SOLR: 1.0000 ug/kg | Freq: Once | SUBCUTANEOUS | Status: DC

## 2016-04-03 NOTE — Progress Notes (Signed)
Plts were 274, and 319 for the last two lab draws. Per protocol, if plts greater than 200 for two consecutive weeks, decrease dose by 48mck/kg. Current dosing is 6mcg/kg already, therefore will no give nplate today per protocol.

## 2016-04-10 ENCOUNTER — Inpatient Hospital Stay: Payer: PPO

## 2016-04-10 DIAGNOSIS — D693 Immune thrombocytopenic purpura: Secondary | ICD-10-CM

## 2016-04-10 LAB — CBC WITH DIFFERENTIAL/PLATELET
BASOS ABS: 0 10*3/uL (ref 0–0.1)
BASOS PCT: 1 %
EOS ABS: 0.3 10*3/uL (ref 0–0.7)
Eosinophils Relative: 4 %
HEMATOCRIT: 28.9 % — AB (ref 40.0–52.0)
HEMOGLOBIN: 10.1 g/dL — AB (ref 13.0–18.0)
Lymphocytes Relative: 25 %
Lymphs Abs: 1.6 10*3/uL (ref 1.0–3.6)
MCH: 35.5 pg — ABNORMAL HIGH (ref 26.0–34.0)
MCHC: 35 g/dL (ref 32.0–36.0)
MCV: 101.3 fL — ABNORMAL HIGH (ref 80.0–100.0)
MONO ABS: 0.7 10*3/uL (ref 0.2–1.0)
MONOS PCT: 12 %
NEUTROS ABS: 3.9 10*3/uL (ref 1.4–6.5)
NEUTROS PCT: 58 %
Platelets: 240 10*3/uL (ref 150–440)
RBC: 2.85 MIL/uL — ABNORMAL LOW (ref 4.40–5.90)
RDW: 14.6 % — AB (ref 11.5–14.5)
WBC: 6.5 10*3/uL (ref 3.8–10.6)

## 2016-04-10 NOTE — Progress Notes (Signed)
Plts are 240 today. Nplate was held last week due to plts above 200,and will be held again this week as plts are again above 200.

## 2016-04-11 DIAGNOSIS — F5104 Psychophysiologic insomnia: Secondary | ICD-10-CM | POA: Diagnosis not present

## 2016-04-11 DIAGNOSIS — F419 Anxiety disorder, unspecified: Secondary | ICD-10-CM | POA: Diagnosis not present

## 2016-04-11 DIAGNOSIS — D693 Immune thrombocytopenic purpura: Secondary | ICD-10-CM | POA: Diagnosis not present

## 2016-04-11 DIAGNOSIS — E7800 Pure hypercholesterolemia, unspecified: Secondary | ICD-10-CM | POA: Diagnosis not present

## 2016-04-11 DIAGNOSIS — Z125 Encounter for screening for malignant neoplasm of prostate: Secondary | ICD-10-CM | POA: Diagnosis not present

## 2016-04-11 DIAGNOSIS — I10 Essential (primary) hypertension: Secondary | ICD-10-CM | POA: Diagnosis not present

## 2016-04-11 DIAGNOSIS — Z79899 Other long term (current) drug therapy: Secondary | ICD-10-CM | POA: Diagnosis not present

## 2016-04-19 ENCOUNTER — Inpatient Hospital Stay: Payer: PPO

## 2016-04-19 DIAGNOSIS — D693 Immune thrombocytopenic purpura: Secondary | ICD-10-CM | POA: Diagnosis not present

## 2016-04-19 LAB — CBC WITH DIFFERENTIAL/PLATELET
Basophils Absolute: 0 10*3/uL (ref 0–0.1)
Basophils Relative: 1 %
Eosinophils Absolute: 0.2 10*3/uL (ref 0–0.7)
Eosinophils Relative: 4 %
HEMATOCRIT: 29.6 % — AB (ref 40.0–52.0)
HEMOGLOBIN: 10.4 g/dL — AB (ref 13.0–18.0)
LYMPHS ABS: 1.8 10*3/uL (ref 1.0–3.6)
LYMPHS PCT: 29 %
MCH: 35.6 pg — AB (ref 26.0–34.0)
MCHC: 35.1 g/dL (ref 32.0–36.0)
MCV: 101.5 fL — AB (ref 80.0–100.0)
MONOS PCT: 12 %
Monocytes Absolute: 0.7 10*3/uL (ref 0.2–1.0)
NEUTROS ABS: 3.4 10*3/uL (ref 1.4–6.5)
NEUTROS PCT: 54 %
Platelets: 149 10*3/uL — ABNORMAL LOW (ref 150–440)
RBC: 2.92 MIL/uL — ABNORMAL LOW (ref 4.40–5.90)
RDW: 13 % (ref 11.5–14.5)
WBC: 6.2 10*3/uL (ref 3.8–10.6)

## 2016-04-19 MED ORDER — ROMIPLOSTIM 250 MCG ~~LOC~~ SOLR
80.0000 ug | Freq: Once | SUBCUTANEOUS | Status: AC
  Administered 2016-04-19: 80 ug via SUBCUTANEOUS
  Filled 2016-04-19: qty 0.16

## 2016-04-24 ENCOUNTER — Inpatient Hospital Stay: Payer: PPO

## 2016-04-24 ENCOUNTER — Inpatient Hospital Stay: Payer: PPO | Attending: Internal Medicine

## 2016-04-24 VITALS — BP 103/62 | HR 66 | Temp 96.6°F | Resp 18

## 2016-04-24 DIAGNOSIS — E7800 Pure hypercholesterolemia, unspecified: Secondary | ICD-10-CM | POA: Insufficient documentation

## 2016-04-24 DIAGNOSIS — Z8546 Personal history of malignant neoplasm of prostate: Secondary | ICD-10-CM | POA: Insufficient documentation

## 2016-04-24 DIAGNOSIS — E860 Dehydration: Secondary | ICD-10-CM | POA: Diagnosis not present

## 2016-04-24 DIAGNOSIS — D693 Immune thrombocytopenic purpura: Secondary | ICD-10-CM

## 2016-04-24 DIAGNOSIS — Z79899 Other long term (current) drug therapy: Secondary | ICD-10-CM | POA: Diagnosis not present

## 2016-04-24 DIAGNOSIS — I10 Essential (primary) hypertension: Secondary | ICD-10-CM | POA: Insufficient documentation

## 2016-04-24 DIAGNOSIS — D589 Hereditary hemolytic anemia, unspecified: Secondary | ICD-10-CM | POA: Insufficient documentation

## 2016-04-24 LAB — CBC WITH DIFFERENTIAL/PLATELET
BASOS ABS: 0 10*3/uL (ref 0–0.1)
BASOS PCT: 1 %
EOS ABS: 0.4 10*3/uL (ref 0–0.7)
Eosinophils Relative: 6 %
HEMATOCRIT: 29.4 % — AB (ref 40.0–52.0)
HEMOGLOBIN: 10.2 g/dL — AB (ref 13.0–18.0)
Lymphocytes Relative: 32 %
Lymphs Abs: 1.9 10*3/uL (ref 1.0–3.6)
MCH: 35.2 pg — ABNORMAL HIGH (ref 26.0–34.0)
MCHC: 34.6 g/dL (ref 32.0–36.0)
MCV: 101.8 fL — ABNORMAL HIGH (ref 80.0–100.0)
MONOS PCT: 11 %
Monocytes Absolute: 0.7 10*3/uL (ref 0.2–1.0)
NEUTROS ABS: 3.1 10*3/uL (ref 1.4–6.5)
NEUTROS PCT: 50 %
Platelets: 182 10*3/uL (ref 150–440)
RBC: 2.89 MIL/uL — AB (ref 4.40–5.90)
RDW: 12.8 % (ref 11.5–14.5)
WBC: 6.1 10*3/uL (ref 3.8–10.6)

## 2016-04-24 MED ORDER — ROMIPLOSTIM 250 MCG ~~LOC~~ SOLR
80.0000 ug | Freq: Once | SUBCUTANEOUS | Status: AC
Start: 2016-04-24 — End: 2016-04-24
  Administered 2016-04-24: 80 ug via SUBCUTANEOUS
  Filled 2016-04-24: qty 0.16

## 2016-05-01 ENCOUNTER — Inpatient Hospital Stay: Payer: PPO

## 2016-05-03 ENCOUNTER — Inpatient Hospital Stay: Payer: PPO

## 2016-05-03 DIAGNOSIS — D693 Immune thrombocytopenic purpura: Secondary | ICD-10-CM | POA: Diagnosis not present

## 2016-05-03 LAB — CBC WITH DIFFERENTIAL/PLATELET
Basophils Absolute: 0 10*3/uL (ref 0–0.1)
Basophils Relative: 1 %
Eosinophils Absolute: 0.3 10*3/uL (ref 0–0.7)
Eosinophils Relative: 4 %
HEMATOCRIT: 28.2 % — AB (ref 40.0–52.0)
Hemoglobin: 9.9 g/dL — ABNORMAL LOW (ref 13.0–18.0)
LYMPHS ABS: 1.8 10*3/uL (ref 1.0–3.6)
LYMPHS PCT: 25 %
MCH: 35.9 pg — ABNORMAL HIGH (ref 26.0–34.0)
MCHC: 35.2 g/dL (ref 32.0–36.0)
MCV: 102.2 fL — AB (ref 80.0–100.0)
MONO ABS: 0.8 10*3/uL (ref 0.2–1.0)
MONOS PCT: 11 %
NEUTROS ABS: 4.4 10*3/uL (ref 1.4–6.5)
Neutrophils Relative %: 59 %
Platelets: 490 10*3/uL — ABNORMAL HIGH (ref 150–440)
RBC: 2.76 MIL/uL — ABNORMAL LOW (ref 4.40–5.90)
RDW: 12.8 % (ref 11.5–14.5)
WBC: 7.3 10*3/uL (ref 3.8–10.6)

## 2016-05-04 ENCOUNTER — Other Ambulatory Visit: Payer: Self-pay | Admitting: Internal Medicine

## 2016-05-08 ENCOUNTER — Inpatient Hospital Stay: Payer: PPO

## 2016-05-09 ENCOUNTER — Inpatient Hospital Stay: Payer: PPO

## 2016-05-09 DIAGNOSIS — D693 Immune thrombocytopenic purpura: Secondary | ICD-10-CM

## 2016-05-09 LAB — CBC WITH DIFFERENTIAL/PLATELET
Basophils Absolute: 0 10*3/uL (ref 0–0.1)
Basophils Relative: 1 %
Eosinophils Absolute: 0.3 10*3/uL (ref 0–0.7)
Eosinophils Relative: 5 %
HEMATOCRIT: 27.5 % — AB (ref 40.0–52.0)
Hemoglobin: 9.8 g/dL — ABNORMAL LOW (ref 13.0–18.0)
LYMPHS ABS: 1.9 10*3/uL (ref 1.0–3.6)
LYMPHS PCT: 29 %
MCH: 36 pg — ABNORMAL HIGH (ref 26.0–34.0)
MCHC: 35.6 g/dL (ref 32.0–36.0)
MCV: 100.9 fL — AB (ref 80.0–100.0)
MONO ABS: 0.8 10*3/uL (ref 0.2–1.0)
MONOS PCT: 12 %
NEUTROS ABS: 3.7 10*3/uL (ref 1.4–6.5)
Neutrophils Relative %: 55 %
Platelets: 381 10*3/uL (ref 150–440)
RBC: 2.73 MIL/uL — ABNORMAL LOW (ref 4.40–5.90)
RDW: 13.1 % (ref 11.5–14.5)
WBC: 6.7 10*3/uL (ref 3.8–10.6)

## 2016-05-09 NOTE — Progress Notes (Signed)
Patient's platelets are 381k today and confirmed with [facility] that Nplate not needed.  Does have an appointment next week for lab/MD/Nplate next week ([id number]).

## 2016-05-15 ENCOUNTER — Inpatient Hospital Stay (HOSPITAL_BASED_OUTPATIENT_CLINIC_OR_DEPARTMENT_OTHER): Payer: PPO | Admitting: Oncology

## 2016-05-15 ENCOUNTER — Inpatient Hospital Stay: Payer: PPO

## 2016-05-15 DIAGNOSIS — Z8546 Personal history of malignant neoplasm of prostate: Secondary | ICD-10-CM | POA: Diagnosis not present

## 2016-05-15 DIAGNOSIS — D693 Immune thrombocytopenic purpura: Secondary | ICD-10-CM

## 2016-05-15 DIAGNOSIS — Z79899 Other long term (current) drug therapy: Secondary | ICD-10-CM | POA: Diagnosis not present

## 2016-05-15 DIAGNOSIS — D589 Hereditary hemolytic anemia, unspecified: Secondary | ICD-10-CM | POA: Diagnosis not present

## 2016-05-15 LAB — CBC WITH DIFFERENTIAL/PLATELET
BASOS ABS: 0 10*3/uL (ref 0–0.1)
BASOS PCT: 1 %
EOS PCT: 5 %
Eosinophils Absolute: 0.3 10*3/uL (ref 0–0.7)
HEMATOCRIT: 28.6 % — AB (ref 40.0–52.0)
Hemoglobin: 10.2 g/dL — ABNORMAL LOW (ref 13.0–18.0)
Lymphocytes Relative: 31 %
Lymphs Abs: 1.7 10*3/uL (ref 1.0–3.6)
MCH: 35.9 pg — ABNORMAL HIGH (ref 26.0–34.0)
MCHC: 35.6 g/dL (ref 32.0–36.0)
MCV: 100.8 fL — ABNORMAL HIGH (ref 80.0–100.0)
MONO ABS: 0.7 10*3/uL (ref 0.2–1.0)
MONOS PCT: 12 %
Neutro Abs: 2.9 10*3/uL (ref 1.4–6.5)
Neutrophils Relative %: 51 %
PLATELETS: 216 10*3/uL (ref 150–440)
RBC: 2.84 MIL/uL — ABNORMAL LOW (ref 4.40–5.90)
RDW: 13 % (ref 11.5–14.5)
WBC: 5.6 10*3/uL (ref 3.8–10.6)

## 2016-05-15 MED ORDER — ROMIPLOSTIM 250 MCG ~~LOC~~ SOLR
1.0000 ug/kg | Freq: Once | SUBCUTANEOUS | Status: AC
  Administered 2016-05-15: 80 ug via SUBCUTANEOUS
  Filled 2016-05-15: qty 0.16

## 2016-05-15 NOTE — Progress Notes (Signed)
ITP- Currently on third line therapy with N-plate; Patient's platelets 1 week ago was 381, today they are 216. Will give N-plate today. Reevaluate in two weeks. Patient wants to go to the beach next week. Will resume weekly labs after next week and then see MD in 2 months

## 2016-05-15 NOTE — Progress Notes (Signed)
Patient thought they were seeing [facility] today. They had questions for Dr. Rogue Bussing.

## 2016-05-15 NOTE — Progress Notes (Signed)
[facility]  [phone]  Fax:([phone]) [place] DOB: 1931/12/07  MR#: [id number]  [id number]  Patient Care Team: Derinda Late, MD as PCP - General (Family Medicine)  CHIEF COMPLAINT:  Chief Complaint  Patient presents with  . 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 He currently feels well although noticing he is more fatigued and weak than usual. He denies nausea, vomiting. No dizziness. No difficulty swallowing or pain while swallowing.    REVIEW OF SYSTEMS:   Review of Systems  Constitutional: Positive for malaise/fatigue.  HENT: Negative.   Eyes: Negative.   Respiratory: Negative.   Cardiovascular: Negative.   Gastrointestinal: Negative.   Genitourinary: Negative.   Musculoskeletal: Negative.   Skin: Negative.   Neurological: Positive for weakness.  Psychiatric/Behavioral: Negative.     As per HPI. Otherwise, a complete review of systems is negatve.  ONCOLOGY HISTORY:  No history exists.    PAST MEDICAL HISTORY: Past Medical History  Diagnosis Date  . Shingles   . Dehydration   . Hypertension   . Prostate cancer ([place])   . Hypercholesteremia   . 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 Insomnia   . Leukocytosis   . Autoimmune hemolytic anemia (HCC)   . Detached retina     PAST SURGICAL HISTORY: Past Surgical History  Procedure Laterality Date  . Hernia repair    . Eye surgery    . Artificial left eye    . Joint replacement    . Prostate surgery      FAMILY HISTORY Family History  Problem Relation Age of Onset  . Cancer Mother   . Multiple sclerosis Father    ADVANCED DIRECTIVES:    HEALTH MAINTENANCE: Social History  Substance Use Topics  .  Smoking status: Never Smoker   . Smokeless tobacco: Not on file  . Alcohol Use: No    Allergies  Allergen Reactions  . Cephalexin Diarrhea    C.diff  . Hydrocodone-Acetaminophen Nausea Only    Vomiting/dizziness    Current Outpatient Prescriptions  Medication Sig Dispense Refill  . acetaminophen (TYLENOL) 500 MG tablet Take 500 mg by mouth every 6 (six) hours as needed for mild pain. Reported on 01/24/2016    . citalopram (CELEXA) 10 MG tablet Take 10 mg by mouth daily.    . Cyanocobalamin (RA VITAMIN B-12 TR) 1000 MCG TBCR Take 1,000 mcg by mouth daily.    . feeding supplement, ENSURE ENLIVE, (ENSURE ENLIVE) LIQD Take 237 mLs by mouth 2 (two) times daily between meals. 237 mL 12  . furosemide (LASIX) 20 MG tablet Take 20 mg by mouth daily.    Marland Kitchen gabapentin (NEURONTIN) 100 MG capsule Take 300 mg by mouth at bedtime. Reported on 12/03/2015    . latanoprost (XALATAN) 0.005 % ophthalmic solution Place 1 drop into the right eye at bedtime. At night    . lisinopril (PRINIVIL,ZESTRIL) 20 MG tablet Take 20 mg by mouth every morning.     . nystatin (MYCOSTATIN) 100000 UNIT/ML suspension Take 5 mLs (500,000 Units total) by mouth 4 (four) times daily. 60 mL 0  . omeprazole (PRILOSEC) 40 MG capsule TAKE 1 CAPSULE BY MOUTH USUALLY 30 MINS BEFORE BREAKFAST 30 capsule 2  . polyethylene  glycol (MIRALAX / GLYCOLAX) packet Take 17 g by mouth daily as needed for mild constipation. 14 each 0  . polyvinyl alcohol (LIQUIFILM TEARS) 1.4 % ophthalmic solution Place 2 drops into the left eye 3 (three) times daily as needed for dry eyes. 15 mL 0  . potassium chloride SA (K-DUR,KLOR-CON) 20 MEQ tablet Take by mouth.    . pravastatin (PRAVACHOL) 20 MG tablet Take 20 mg by mouth at bedtime.     . predniSONE (DELTASONE) 20 MG tablet 4 tablets orally daily    . zolpidem (AMBIEN) 5 MG tablet TAKE ONE TABLET AT BEDTIME AS NEEDED FOR SLEEPLESSNESS (Patient taking differently: Take 5 mg by mouth at bedtime. ) 30 tablet 1    No current facility-administered medications for this visit.    OBJECTIVE: BP 138/62 mmHg  Pulse 61  Temp(Src) 97.6 F (36.4 C) (Tympanic)  Resp 18  Wt 170 lb 13.7 oz (77.5 kg)   Body mass index is 27.33 kg/(m^2).    ECOG FS:1 - Symptomatic but completely ambulatory  GENERAL: Well-nourished well-developed; Alert, no distress and comfortable. Accompanied by his wife. EYES: no pallor or icterus OROPHARYNX: no thrush or ulceration; good dentition no bleeding noted. LUNGS: clear to auscultation and No wheeze or crackles HEART/CVS: regular rate & rhythm and no murmurs; No lower extremity edema PSYCH: alert & oriented x 3 with fluent speech NEURO: no focal motor/sensory deficits SKIN: no rashes or significant lesions  LAB RESULTS:  Office Visit on 05/15/2016  Component Date Value Ref Range Status  . WBC 05/15/2016 5.6  3.8 - 10.6 K/uL Final  . RBC 05/15/2016 2.84* 4.40 - 5.90 MIL/uL Final  . Hemoglobin 05/15/2016 10.2* 13.0 - 18.0 g/dL Final  . HCT 05/15/2016 28.6* 40.0 - 52.0 % Final  . MCV 05/15/2016 100.8* 80.0 - 100.0 fL Final  . MCH 05/15/2016 35.9* 26.0 - 34.0 pg Final  . MCHC 05/15/2016 35.6  32.0 - 36.0 g/dL Final  . RDW 05/15/2016 13.0  11.5 - 14.5 % Final  . Platelets 05/15/2016 216  150 - 440 K/uL Final  . Neutrophils Relative % 05/15/2016 51   Final  . Neutro Abs 05/15/2016 2.9  1.4 - 6.5 K/uL Final  . Lymphocytes Relative 05/15/2016 31   Final  . Lymphs Abs 05/15/2016 1.7  1.0 - 3.6 K/uL Final  . Monocytes Relative 05/15/2016 12   Final  . Monocytes Absolute 05/15/2016 0.7  0.2 - 1.0 K/uL Final  . Eosinophils Relative 05/15/2016 5   Final  . Eosinophils Absolute 05/15/2016 0.3  0 - 0.7 K/uL Final  . Basophils Relative 05/15/2016 1   Final  . Basophils Absolute 05/15/2016 0.0  0 - 0.1 K/uL Final    STUDIES: No results found.  ASSESSMENT & PLAN:   1.  ITP-  Currently on third line therapy with N-plate; Patient's platelets 1 week ago was 381, today they  are 216. Will give N-plate today. Reevaluate in two weeks. Patient wants to go to the beach next week. Will resume weekly labs after next week and then see MD in 2 months.  2.Tiredness fatigue- will draw iron studies, haptoglobin and ldh at next blood draw  3.  Anemia hemoglobin 10.2 history of hemolytic anemia. Stable.  4.  Patient will have weekly labs on Mondays/plan treatment the same day.   Patient expressed understanding and was in agreement with this plan. He also understands that He can call clinic at any time with any questions, concerns, or complaints.   Dr.  Rogue Bussing was available for consultation and review of plan of care for this patient.  Mayra Reel, NP   05/15/2016 3:22 PM

## 2016-05-22 ENCOUNTER — Ambulatory Visit: Payer: PPO | Admitting: Internal Medicine

## 2016-05-22 ENCOUNTER — Ambulatory Visit: Payer: PPO

## 2016-05-22 ENCOUNTER — Other Ambulatory Visit: Payer: PPO

## 2016-05-29 ENCOUNTER — Inpatient Hospital Stay: Payer: PPO

## 2016-05-29 ENCOUNTER — Inpatient Hospital Stay: Payer: PPO | Attending: Internal Medicine

## 2016-05-29 DIAGNOSIS — D693 Immune thrombocytopenic purpura: Secondary | ICD-10-CM

## 2016-05-29 LAB — COMPREHENSIVE METABOLIC PANEL
ALT: 20 U/L (ref 17–63)
ANION GAP: 4 — AB (ref 5–15)
AST: 26 U/L (ref 15–41)
Albumin: 3.8 g/dL (ref 3.5–5.0)
Alkaline Phosphatase: 86 U/L (ref 38–126)
BILIRUBIN TOTAL: 1.2 mg/dL (ref 0.3–1.2)
BUN: 18 mg/dL (ref 6–20)
CALCIUM: 8.7 mg/dL — AB (ref 8.9–10.3)
CHLORIDE: 103 mmol/L (ref 101–111)
CO2: 26 mmol/L (ref 22–32)
Creatinine, Ser: 0.84 mg/dL (ref 0.61–1.24)
Glucose, Bld: 127 mg/dL — ABNORMAL HIGH (ref 65–99)
POTASSIUM: 3.9 mmol/L (ref 3.5–5.1)
Sodium: 133 mmol/L — ABNORMAL LOW (ref 135–145)
TOTAL PROTEIN: 6.5 g/dL (ref 6.5–8.1)

## 2016-05-29 LAB — CBC WITH DIFFERENTIAL/PLATELET
BASOS PCT: 1 %
Basophils Absolute: 0.1 10*3/uL (ref 0–0.1)
EOS ABS: 0.3 10*3/uL (ref 0–0.7)
Eosinophils Relative: 4 %
HEMATOCRIT: 25.9 % — AB (ref 40.0–52.0)
HEMOGLOBIN: 9.4 g/dL — AB (ref 13.0–18.0)
LYMPHS ABS: 1.8 10*3/uL (ref 1.0–3.6)
Lymphocytes Relative: 23 %
MCH: 37.9 pg — ABNORMAL HIGH (ref 26.0–34.0)
MCHC: 36.2 g/dL — ABNORMAL HIGH (ref 32.0–36.0)
MCV: 104.9 fL — ABNORMAL HIGH (ref 80.0–100.0)
MONOS PCT: 11 %
Monocytes Absolute: 0.8 10*3/uL (ref 0.2–1.0)
NEUTROS ABS: 4.7 10*3/uL (ref 1.4–6.5)
NEUTROS PCT: 61 %
Platelets: 279 10*3/uL (ref 150–440)
RBC: 2.47 MIL/uL — AB (ref 4.40–5.90)
RDW: 15.6 % — ABNORMAL HIGH (ref 11.5–14.5)
WBC: 7.7 10*3/uL (ref 3.8–10.6)

## 2016-05-29 LAB — LACTATE DEHYDROGENASE: LDH: 274 U/L — AB (ref 98–192)

## 2016-05-29 LAB — FERRITIN: FERRITIN: 35 ng/mL (ref 24–336)

## 2016-05-29 LAB — IRON AND TIBC
IRON: 69 ug/dL (ref 45–182)
Saturation Ratios: 21 % (ref 17.9–39.5)
TIBC: 326 ug/dL (ref 250–450)
UIBC: 257 ug/dL

## 2016-05-30 LAB — HAPTOGLOBIN: Haptoglobin: <10 mg/dL — ABNORMAL LOW (ref 34–200)

## 2016-05-31 ENCOUNTER — Other Ambulatory Visit: Payer: Self-pay | Admitting: *Deleted

## 2016-05-31 DIAGNOSIS — D693 Immune thrombocytopenic purpura: Secondary | ICD-10-CM

## 2016-06-05 ENCOUNTER — Inpatient Hospital Stay: Payer: PPO

## 2016-06-05 ENCOUNTER — Telehealth: Payer: Self-pay | Admitting: *Deleted

## 2016-06-05 DIAGNOSIS — D693 Immune thrombocytopenic purpura: Secondary | ICD-10-CM

## 2016-06-05 LAB — CBC WITH DIFFERENTIAL/PLATELET
BASOS ABS: 0.1 10*3/uL (ref 0–0.1)
BASOS PCT: 1 %
Eosinophils Absolute: 0.3 10*3/uL (ref 0–0.7)
Eosinophils Relative: 5 %
HEMATOCRIT: 25.4 % — AB (ref 40.0–52.0)
HEMOGLOBIN: 9.3 g/dL — AB (ref 13.0–18.0)
LYMPHS PCT: 26 %
Lymphs Abs: 2 10*3/uL (ref 1.0–3.6)
MCH: 39.3 pg — AB (ref 26.0–34.0)
MCHC: 36.5 g/dL — AB (ref 32.0–36.0)
MCV: 107.7 fL — AB (ref 80.0–100.0)
MONOS PCT: 11 %
Monocytes Absolute: 0.8 10*3/uL (ref 0.2–1.0)
NEUTROS ABS: 4.5 10*3/uL (ref 1.4–6.5)
NEUTROS PCT: 57 %
PLATELETS: 240 10*3/uL (ref 150–440)
RBC: 2.36 MIL/uL — ABNORMAL LOW (ref 4.40–5.90)
RDW: 15.4 % — AB (ref 11.5–14.5)
WBC: 7.7 10*3/uL (ref 3.8–10.6)

## 2016-06-05 LAB — LACTATE DEHYDROGENASE: LDH: 317 U/L — AB (ref 98–192)

## 2016-06-05 NOTE — Telephone Encounter (Signed)
Left vm msg for patient. Iron stores stable-not the cause of patient's anemia. Per Dr. Estill Cotta blood may be hemolyzing. If so, tx would include possible steroids.

## 2016-06-05 NOTE — Telephone Encounter (Signed)
-----   Message from Vanice Sarah, [place] sent at 06/05/2016  3:15 PM EDT ----- Nira Conn, the last couple of times I have given patient his platelet results him and his wife have been asking about the lab results from the iron studies that were taken a few weeks ago.  Would also like to know if Dr. B plans on doing anything about the anemia.  Thanks

## 2016-06-12 ENCOUNTER — Inpatient Hospital Stay: Payer: PPO

## 2016-06-12 DIAGNOSIS — D693 Immune thrombocytopenic purpura: Secondary | ICD-10-CM | POA: Diagnosis not present

## 2016-06-12 LAB — CBC WITH DIFFERENTIAL/PLATELET
BASOS ABS: 0 10*3/uL (ref 0–0.1)
BASOS PCT: 1 %
Eosinophils Absolute: 0.4 10*3/uL (ref 0–0.7)
Eosinophils Relative: 5 %
HEMATOCRIT: 25.6 % — AB (ref 40.0–52.0)
Hemoglobin: 9.1 g/dL — ABNORMAL LOW (ref 13.0–18.0)
LYMPHS PCT: 26 %
Lymphs Abs: 2 10*3/uL (ref 1.0–3.6)
MCH: 39.1 pg — ABNORMAL HIGH (ref 26.0–34.0)
MCHC: 35.4 g/dL (ref 32.0–36.0)
MCV: 110.3 fL — AB (ref 80.0–100.0)
Monocytes Absolute: 0.9 10*3/uL (ref 0.2–1.0)
Monocytes Relative: 12 %
NEUTROS ABS: 4.5 10*3/uL (ref 1.4–6.5)
NEUTROS PCT: 56 %
Platelets: 105 10*3/uL — ABNORMAL LOW (ref 150–440)
RBC: 2.32 MIL/uL — AB (ref 4.40–5.90)
RDW: 14.9 % — ABNORMAL HIGH (ref 11.5–14.5)
WBC: 7.8 10*3/uL (ref 3.8–10.6)

## 2016-06-12 LAB — LACTATE DEHYDROGENASE: LDH: 377 U/L — ABNORMAL HIGH (ref 98–192)

## 2016-06-12 MED ORDER — ROMIPLOSTIM 250 MCG ~~LOC~~ SOLR
80.0000 ug | Freq: Once | SUBCUTANEOUS | Status: AC
  Administered 2016-06-12: 80 ug via SUBCUTANEOUS
  Filled 2016-06-12: qty 0.16

## 2016-06-12 NOTE — Progress Notes (Signed)
Pt received Nplate today. Asking if okay to skip next Monday's appt, he is going out of town. Spoke with Dr Rogue Bussing who stated it would be fine for patient to not come next Monday.

## 2016-06-15 DIAGNOSIS — D693 Immune thrombocytopenic purpura: Secondary | ICD-10-CM | POA: Diagnosis not present

## 2016-06-15 DIAGNOSIS — I82409 Acute embolism and thrombosis of unspecified deep veins of unspecified lower extremity: Secondary | ICD-10-CM | POA: Diagnosis not present

## 2016-06-15 DIAGNOSIS — R55 Syncope and collapse: Secondary | ICD-10-CM | POA: Diagnosis not present

## 2016-06-15 DIAGNOSIS — E785 Hyperlipidemia, unspecified: Secondary | ICD-10-CM | POA: Diagnosis not present

## 2016-06-15 DIAGNOSIS — K219 Gastro-esophageal reflux disease without esophagitis: Secondary | ICD-10-CM | POA: Diagnosis not present

## 2016-06-15 DIAGNOSIS — D6489 Other specified anemias: Secondary | ICD-10-CM | POA: Diagnosis not present

## 2016-06-15 DIAGNOSIS — R600 Localized edema: Secondary | ICD-10-CM | POA: Diagnosis not present

## 2016-06-15 DIAGNOSIS — I10 Essential (primary) hypertension: Secondary | ICD-10-CM | POA: Diagnosis not present

## 2016-06-15 DIAGNOSIS — G629 Polyneuropathy, unspecified: Secondary | ICD-10-CM | POA: Diagnosis not present

## 2016-06-16 DIAGNOSIS — R55 Syncope and collapse: Secondary | ICD-10-CM | POA: Diagnosis not present

## 2016-06-19 ENCOUNTER — Other Ambulatory Visit: Payer: PPO

## 2016-06-19 ENCOUNTER — Ambulatory Visit: Payer: PPO

## 2016-06-23 DIAGNOSIS — R55 Syncope and collapse: Secondary | ICD-10-CM | POA: Diagnosis not present

## 2016-06-23 DIAGNOSIS — R319 Hematuria, unspecified: Secondary | ICD-10-CM | POA: Diagnosis not present

## 2016-06-23 DIAGNOSIS — E7800 Pure hypercholesterolemia, unspecified: Secondary | ICD-10-CM | POA: Diagnosis not present

## 2016-06-23 DIAGNOSIS — I10 Essential (primary) hypertension: Secondary | ICD-10-CM | POA: Diagnosis not present

## 2016-06-26 ENCOUNTER — Inpatient Hospital Stay: Payer: PPO

## 2016-06-26 ENCOUNTER — Inpatient Hospital Stay: Payer: PPO | Attending: Internal Medicine

## 2016-06-26 DIAGNOSIS — R319 Hematuria, unspecified: Secondary | ICD-10-CM | POA: Diagnosis not present

## 2016-06-26 DIAGNOSIS — D693 Immune thrombocytopenic purpura: Secondary | ICD-10-CM | POA: Insufficient documentation

## 2016-06-26 DIAGNOSIS — D589 Hereditary hemolytic anemia, unspecified: Secondary | ICD-10-CM | POA: Diagnosis not present

## 2016-06-26 DIAGNOSIS — Z79899 Other long term (current) drug therapy: Secondary | ICD-10-CM | POA: Diagnosis not present

## 2016-06-26 DIAGNOSIS — I10 Essential (primary) hypertension: Secondary | ICD-10-CM | POA: Diagnosis not present

## 2016-06-26 DIAGNOSIS — Z8546 Personal history of malignant neoplasm of prostate: Secondary | ICD-10-CM | POA: Diagnosis not present

## 2016-06-26 DIAGNOSIS — E7800 Pure hypercholesterolemia, unspecified: Secondary | ICD-10-CM | POA: Diagnosis not present

## 2016-06-26 LAB — CBC WITH DIFFERENTIAL/PLATELET
BASOS ABS: 0.1 10*3/uL (ref 0–0.1)
BASOS PCT: 2 %
EOS ABS: 0.2 10*3/uL (ref 0–0.7)
EOS PCT: 4 %
HCT: 23.7 % — ABNORMAL LOW (ref 40.0–52.0)
Hemoglobin: 8.5 g/dL — ABNORMAL LOW (ref 13.0–18.0)
LYMPHS PCT: 30 %
Lymphs Abs: 1.8 10*3/uL (ref 1.0–3.6)
MCH: 41.1 pg — ABNORMAL HIGH (ref 26.0–34.0)
MCHC: 35.9 g/dL (ref 32.0–36.0)
MCV: 114.4 fL — ABNORMAL HIGH (ref 80.0–100.0)
Monocytes Absolute: 1 10*3/uL (ref 0.2–1.0)
Monocytes Relative: 17 %
NEUTROS ABS: 2.9 10*3/uL (ref 1.4–6.5)
NEUTROS PCT: 47 %
PLATELETS: 447 10*3/uL — AB (ref 150–440)
RBC: 2.07 MIL/uL — ABNORMAL LOW (ref 4.40–5.90)
RDW: 13.4 % (ref 11.5–14.5)
WBC: 6 10*3/uL (ref 3.8–10.6)

## 2016-06-26 LAB — LACTATE DEHYDROGENASE: LDH: 483 U/L — ABNORMAL HIGH (ref 98–192)

## 2016-06-28 DIAGNOSIS — R300 Dysuria: Secondary | ICD-10-CM | POA: Diagnosis not present

## 2016-06-28 DIAGNOSIS — D693 Immune thrombocytopenic purpura: Secondary | ICD-10-CM | POA: Diagnosis not present

## 2016-06-28 DIAGNOSIS — R55 Syncope and collapse: Secondary | ICD-10-CM | POA: Diagnosis not present

## 2016-06-28 DIAGNOSIS — Z09 Encounter for follow-up examination after completed treatment for conditions other than malignant neoplasm: Secondary | ICD-10-CM | POA: Diagnosis not present

## 2016-06-29 ENCOUNTER — Telehealth: Payer: Self-pay | Admitting: *Deleted

## 2016-06-29 NOTE — Telephone Encounter (Signed)
-----   Message from [name], MD sent at 06/28/2016  7:45 PM EDT ----- Please inform patient that his hemoglobin is dropping to 8.5; recommend prednisone 60 mg once a day with food [give prescription for 4 weeks]- check labs on a weekly basis; further instructions regarding steroids based on labs. Thx

## 2016-06-29 NOTE — Telephone Encounter (Signed)
Contacted patient. RN Spoke with patient's wife with md recommendations.  Explained that per md most likely hgb is dropping due to hemolyzation per md.  She gave verbal understanding.  RN Explained the need for prednisone and the recommended dosing instructions (a total of 60 mg daily with food x 4 weeks). Wife states that her husband has 20 mg prednisone tablets at home. She has a total of 120 in the bottle, which is plenty of tablets for now.  No need for RFs.  Wife also states [place]. Pt was administered IV fluids for dehydration. Wife reports that has also seen PCP and urgent care MD for evaluation of discolored dark urine'.  UTI was r/o. Pt still had trace of blood findings in urine. Wife concerned that pt's findings in urine could be contributory of the low hgb.  Pt had a TURP approx. 1 yr ago at [facility] per wife. Wife does not recall the exact urology. She states that if pt's symptoms do not improve, she feels that a consult with urology may be appropriative. Pt is asymptomatic of UTI-denies any dysuria.  Urine has no odor at this time.  Pt is drinking 3-4 water bottles/day.  I explained that per Dr. Jacinto Reap- does not believe that the 2 conditions-dark color urine and hemolysis are correlated.  Teach back process performed with patient's wife.

## 2016-06-30 DIAGNOSIS — R55 Syncope and collapse: Secondary | ICD-10-CM | POA: Diagnosis not present

## 2016-07-03 ENCOUNTER — Other Ambulatory Visit: Payer: Self-pay | Admitting: Internal Medicine

## 2016-07-03 ENCOUNTER — Inpatient Hospital Stay: Payer: PPO

## 2016-07-03 ENCOUNTER — Telehealth: Payer: Self-pay | Admitting: Internal Medicine

## 2016-07-03 ENCOUNTER — Telehealth: Payer: Self-pay | Admitting: Pharmacist

## 2016-07-03 DIAGNOSIS — D693 Immune thrombocytopenic purpura: Secondary | ICD-10-CM

## 2016-07-03 LAB — CBC WITH DIFFERENTIAL/PLATELET
Basophils Absolute: 0 10*3/uL (ref 0–0.1)
Basophils Relative: 0 %
EOS ABS: 0 10*3/uL (ref 0–0.7)
Eosinophils Relative: 0 %
HEMATOCRIT: 26.5 % — AB (ref 40.0–52.0)
HEMOGLOBIN: 9.5 g/dL — AB (ref 13.0–18.0)
LYMPHS ABS: 0.6 10*3/uL — AB (ref 1.0–3.6)
LYMPHS PCT: 6 %
MCH: 40.4 pg — AB (ref 26.0–34.0)
MCHC: 35.7 g/dL (ref 32.0–36.0)
MCV: 113.3 fL — AB (ref 80.0–100.0)
MONOS PCT: 3 %
Monocytes Absolute: 0.3 10*3/uL (ref 0.2–1.0)
NEUTROS PCT: 91 %
Neutro Abs: 9.2 10*3/uL — ABNORMAL HIGH (ref 1.4–6.5)
Platelets: 364 10*3/uL (ref 150–440)
RBC: 2.34 MIL/uL — AB (ref 4.40–5.90)
RDW: 13.8 % (ref 11.5–14.5)
WBC: 10.1 10*3/uL (ref 3.8–10.6)

## 2016-07-03 LAB — LACTATE DEHYDROGENASE: LDH: 481 U/L — AB (ref 98–192)

## 2016-07-03 MED ORDER — ROMIPLOSTIM 250 MCG ~~LOC~~ SOLR: 1.0000 ug/kg | Freq: Once | SUBCUTANEOUS | Status: DC

## 2016-07-03 NOTE — Telephone Encounter (Signed)
Hb 9.5; continue prednsione 60mg /day; repeat cbc/ldh in 1 week. Further instructions to follow.

## 2016-07-03 NOTE — Telephone Encounter (Signed)
Platelets today = 364. On 06/26/16 platelets were over 400 and Nplate dose was held. Dose to be held until platelets less than 200. Next labs 07/10/16.    Results for ROCKIE, HANSLEY (MRN [id number]) as of 07/03/2016 15:49  Ref. Range 05/29/2016 14:25 06/05/2016 14:55 06/12/2016 15:03 06/26/2016 14:05 07/03/2016 14:14  Platelets Latest Ref Range: 150 - 440 K/uL 279 240 105 (L) 447 (H) 364

## 2016-07-03 NOTE — Telephone Encounter (Signed)
Spoke with patient while at clinic for labs.  Informed patient to continue prednisone 60 mg daily.  Will follow up with labs in one week. Verbalized understanding.

## 2016-07-10 ENCOUNTER — Inpatient Hospital Stay: Payer: PPO

## 2016-07-10 VITALS — BP 128/70 | HR 65 | Temp 98.0°F | Resp 18

## 2016-07-10 DIAGNOSIS — D693 Immune thrombocytopenic purpura: Secondary | ICD-10-CM | POA: Diagnosis not present

## 2016-07-10 LAB — CBC WITH DIFFERENTIAL/PLATELET
Basophils Absolute: 0 10*3/uL (ref 0–0.1)
Basophils Relative: 0 %
EOS ABS: 0 10*3/uL (ref 0–0.7)
EOS PCT: 0 %
HCT: 27.4 % — ABNORMAL LOW (ref 40.0–52.0)
Hemoglobin: 9.7 g/dL — ABNORMAL LOW (ref 13.0–18.0)
LYMPHS ABS: 0.5 10*3/uL — AB (ref 1.0–3.6)
Lymphocytes Relative: 4 %
MCH: 38.8 pg — AB (ref 26.0–34.0)
MCHC: 35.4 g/dL (ref 32.0–36.0)
MCV: 109.6 fL — AB (ref 80.0–100.0)
MONOS PCT: 2 %
Monocytes Absolute: 0.3 10*3/uL (ref 0.2–1.0)
Neutro Abs: 12.6 10*3/uL — ABNORMAL HIGH (ref 1.4–6.5)
Neutrophils Relative %: 94 %
Platelets: 232 10*3/uL (ref 150–440)
RBC: 2.5 MIL/uL — ABNORMAL LOW (ref 4.40–5.90)
RDW: 14.5 % (ref 11.5–14.5)
WBC: 13.4 10*3/uL — ABNORMAL HIGH (ref 3.8–10.6)

## 2016-07-10 LAB — LACTATE DEHYDROGENASE: LDH: 358 U/L — ABNORMAL HIGH (ref 98–192)

## 2016-07-10 MED ORDER — ROMIPLOSTIM 250 MCG ~~LOC~~ SOLR
80.0000 ug | Freq: Once | SUBCUTANEOUS | Status: AC
  Administered 2016-07-10: 80 ug via SUBCUTANEOUS
  Filled 2016-07-10: qty 0.16

## 2016-07-17 ENCOUNTER — Inpatient Hospital Stay: Payer: PPO

## 2016-07-17 ENCOUNTER — Inpatient Hospital Stay (HOSPITAL_BASED_OUTPATIENT_CLINIC_OR_DEPARTMENT_OTHER): Payer: PPO | Admitting: Internal Medicine

## 2016-07-17 DIAGNOSIS — R319 Hematuria, unspecified: Secondary | ICD-10-CM

## 2016-07-17 DIAGNOSIS — D693 Immune thrombocytopenic purpura: Secondary | ICD-10-CM | POA: Diagnosis not present

## 2016-07-17 DIAGNOSIS — D589 Hereditary hemolytic anemia, unspecified: Secondary | ICD-10-CM

## 2016-07-17 DIAGNOSIS — Z8546 Personal history of malignant neoplasm of prostate: Secondary | ICD-10-CM | POA: Diagnosis not present

## 2016-07-17 DIAGNOSIS — Z79899 Other long term (current) drug therapy: Secondary | ICD-10-CM

## 2016-07-17 DIAGNOSIS — D590 Drug-induced autoimmune hemolytic anemia: Secondary | ICD-10-CM

## 2016-07-17 HISTORY — DX: Drug-induced autoimmune hemolytic anemia: D59.0

## 2016-07-17 LAB — CBC WITH DIFFERENTIAL/PLATELET
BASOS PCT: 0 %
Basophils Absolute: 0 10*3/uL (ref 0–0.1)
EOS ABS: 0 10*3/uL (ref 0–0.7)
Eosinophils Relative: 0 %
HCT: 27.5 % — ABNORMAL LOW (ref 40.0–52.0)
Hemoglobin: 9.8 g/dL — ABNORMAL LOW (ref 13.0–18.0)
Lymphocytes Relative: 3 %
Lymphs Abs: 0.3 10*3/uL — ABNORMAL LOW (ref 1.0–3.6)
MCH: 38.5 pg — ABNORMAL HIGH (ref 26.0–34.0)
MCHC: 35.6 g/dL (ref 32.0–36.0)
MCV: 108 fL — ABNORMAL HIGH (ref 80.0–100.0)
MONO ABS: 0.1 10*3/uL — AB (ref 0.2–1.0)
MONOS PCT: 1 %
NEUTROS PCT: 96 %
Neutro Abs: 12.2 10*3/uL — ABNORMAL HIGH (ref 1.4–6.5)
Platelets: 291 10*3/uL (ref 150–440)
RBC: 2.55 MIL/uL — ABNORMAL LOW (ref 4.40–5.90)
RDW: 14.8 % — AB (ref 11.5–14.5)
WBC: 12.6 10*3/uL — ABNORMAL HIGH (ref 3.8–10.6)

## 2016-07-17 LAB — LACTATE DEHYDROGENASE: LDH: 289 U/L — AB (ref 98–192)

## 2016-07-17 MED ORDER — PREDNISONE 20 MG PO TABS
ORAL_TABLET | ORAL | 0 refills | Status: DC
Start: 2016-07-17 — End: 2016-10-16

## 2016-07-17 MED ORDER — ROMIPLOSTIM 250 MCG ~~LOC~~ SOLR
1.0000 ug/kg | Freq: Once | SUBCUTANEOUS | Status: AC
  Administered 2016-07-17: 80 ug via SUBCUTANEOUS
  Filled 2016-07-17: qty 0.16

## 2016-07-17 NOTE — Progress Notes (Signed)
[name] OFFICE PROGRESS NOTE  Patient Care Team: Derinda Late, MD as PCP - General (Family Medicine)   SUMMARY OF ONCOLOGIC HISTORY:  #FEB 2016-  ITP  s/p Prednisone; OCT 2016- Relapse of ITP- Prednisone; JAN 13th- START RITUXAN q W x4 [finished Feb 8th]; March 10th 2017- N-plate q W- good response  # Feb 2016- AUTOIMMUNE HEMOLYTIC ANEMIA; Relapsed AUG 2017- Prednisone  # Hx of nose bleeds  INTERVAL HISTORY:  [name] with above history of autoimmune hemolytic anemia along with ITP- currently relapsed in October 2016 on prednisone is here for follow-up. Most recently he is on N-plate. Since the last visit patient was in the hospital/emergency room in [facility] while on vacation. He was admitted there for dehydration syncopal episode.  In the interim patient also relapsed with autoimmune hemolytic anemia- currently on prednisone 60 mg for the last 3 weeks.  He does complain of fatigue especially after playing golf. He had recently spent his weekend on the beach with his wife. Otherwise no nausea no vomiting. No dizziness. No difficulty swallowing or pain while swallowing.    REVIEW OF SYSTEMS:  A complete 10 point review of system is done which is negative except mentioned above/history of present illness.   PAST MEDICAL HISTORY :  Past Medical History:  Diagnosis Date  . Autoimmune hemolytic anemia (HCC)   . Dehydration   . Detached retina   . Hypercholesteremia   . Hypertension   . Insomnia   . 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 Leukocytosis   . Prostate cancer ([place])   . Shingles     PAST SURGICAL HISTORY :   Past Surgical History:  Procedure Laterality Date  . artificial left eye    . EYE SURGERY    . HERNIA REPAIR    . JOINT REPLACEMENT    . PROSTATE SURGERY      FAMILY HISTORY :   Family History  Problem Relation Age of Onset  . Cancer Mother   . Multiple sclerosis  Father     SOCIAL HISTORY:   Social History  Substance Use Topics  . Smoking status: Never Smoker  . Smokeless tobacco: Not on file  . Alcohol use No    ALLERGIES:  is allergic to cephalexin and hydrocodone-acetaminophen.  MEDICATIONS:  Current Outpatient Prescriptions  Medication Sig Dispense Refill  . acetaminophen (TYLENOL) 500 MG tablet Take 500 mg by mouth every 6 (six) hours as needed for mild pain. Reported on 01/24/2016    . citalopram (CELEXA) 10 MG tablet Take 10 mg by mouth daily.    . Cyanocobalamin (RA VITAMIN B-12 TR) 1000 MCG TBCR Take 1,000 mcg by mouth daily.    . feeding supplement, ENSURE ENLIVE, (ENSURE ENLIVE) LIQD Take 237 mLs by mouth 2 (two) times daily between meals. 237 mL 12  . furosemide (LASIX) 20 MG tablet Take 20 mg by mouth daily.    Marland Kitchen gabapentin (NEURONTIN) 100 MG capsule Take 300 mg by mouth at bedtime. Reported on 12/03/2015    . latanoprost (XALATAN) 0.005 % ophthalmic solution Place 1 drop into the right eye at bedtime. At night    . lisinopril (PRINIVIL,ZESTRIL) 20 MG tablet Take 20 mg by mouth every morning.     . nystatin (MYCOSTATIN) 100000 UNIT/ML suspension Take 5 mLs (500,000 Units total) by mouth 4 (four) times daily. 60 mL 0  . omeprazole (PRILOSEC) 40 MG capsule TAKE 1 CAPSULE BY MOUTH USUALLY 30  MINS BEFORE BREAKFAST 30 capsule 2  . polyethylene glycol (MIRALAX / GLYCOLAX) packet Take 17 g by mouth daily as needed for mild constipation. 14 each 0  . polyvinyl alcohol (LIQUIFILM TEARS) 1.4 % ophthalmic solution Place 2 drops into the left eye 3 (three) times daily as needed for dry eyes. 15 mL 0  . potassium chloride SA (K-DUR,KLOR-CON) 20 MEQ tablet Take by mouth.    . pravastatin (PRAVACHOL) 20 MG tablet Take 20 mg by mouth at bedtime.     . predniSONE (DELTASONE) 20 MG tablet Take 20 mg by mouth 3 (three) times daily.    Marland Kitchen zolpidem (AMBIEN) 5 MG tablet TAKE ONE TABLET AT BEDTIME AS NEEDED FOR SLEEPLESSNESS (Patient taking differently: Take  5 mg by mouth at bedtime. ) 30 tablet 1  . predniSONE (DELTASONE) 20 MG tablet 2 tablets orally daily; with food- do not stop until directed. 80 tablet 0   No current facility-administered medications for this visit.     PHYSICAL EXAMINATION:   BP (!) 106/57 (BP Location: Right Arm, Patient Position: Sitting)   Pulse 67   Temp 98 F (36.7 C) (Tympanic)   Resp 18   Wt 171 lb 5 oz (77.7 kg)   BMI 27.40 kg/m   Filed Weights   07/17/16 1426  Weight: 171 lb 5 oz (77.7 kg)    GENERAL: Well-nourished well-developed; Alert, no distress and comfortable.  Accompanied by his wife. EYES: no pallor or icterus OROPHARYNX: no thrush or ulceration; good dentition no bleeding noted. NECK: supple, no masses felt LYMPH:  no palpable lymphadenopathy in the cervical, axillary or inguinal regions LUNGS: clear to auscultation and  No wheeze or crackles HEART/CVS: regular rate & rhythm and no murmurs; No lower extremity edema ABDOMEN:abdomen soft, non-tender and normal bowel sounds Musculoskeletal:no cyanosis of digits and no clubbing  PSYCH: alert & oriented x 3 with fluent speech NEURO: no focal motor/sensory deficits SKIN:  no rashes or significant lesions  LABORATORY DATA:  I have reviewed the data as listed    Component Value Date/Time   NA 133 (L) 05/29/2016 1425   NA 131 (L) 03/17/2015 1425   K 3.9 05/29/2016 1425   K 3.9 03/17/2015 1425   CL 103 05/29/2016 1425   CL 101 03/17/2015 1425   CO2 26 05/29/2016 1425   CO2 26 03/17/2015 1425   GLUCOSE 127 (H) 05/29/2016 1425   GLUCOSE 161 (H) 03/17/2015 1425   BUN 18 05/29/2016 1425   BUN 17 03/17/2015 1425   CREATININE 0.84 05/29/2016 1425   CREATININE 0.90 03/17/2015 1425   CALCIUM 8.7 (L) 05/29/2016 1425   CALCIUM 8.5 (L) 03/17/2015 1425   PROT 6.5 05/29/2016 1425   PROT 6.7 03/17/2015 1425   ALBUMIN 3.8 05/29/2016 1425   ALBUMIN 3.7 03/17/2015 1425   AST 26 05/29/2016 1425   AST 25 03/17/2015 1425   ALT 20 05/29/2016 1425    ALT 23 03/17/2015 1425   ALKPHOS 86 05/29/2016 1425   ALKPHOS 44 03/17/2015 1425   BILITOT 1.2 05/29/2016 1425   BILITOT 0.6 03/17/2015 1425   GFRNONAA >60 05/29/2016 1425   GFRNONAA >60 03/17/2015 1425   GFRAA >60 05/29/2016 1425   GFRAA >60 03/17/2015 1425    No results found for: SPEP, UPEP  Lab Results  Component Value Date   WBC 12.6 (H) 07/17/2016   NEUTROABS 12.2 (H) 07/17/2016   HGB 9.8 (L) 07/17/2016   HCT 27.5 (L) 07/17/2016   MCV 108.0 (H) 07/17/2016  PLT 291 07/17/2016      Chemistry      Component Value Date/Time   NA 133 (L) 05/29/2016 1425   NA 131 (L) 03/17/2015 1425   K 3.9 05/29/2016 1425   K 3.9 03/17/2015 1425   CL 103 05/29/2016 1425   CL 101 03/17/2015 1425   CO2 26 05/29/2016 1425   CO2 26 03/17/2015 1425   BUN 18 05/29/2016 1425   BUN 17 03/17/2015 1425   CREATININE 0.84 05/29/2016 1425   CREATININE 0.90 03/17/2015 1425      Component Value Date/Time   CALCIUM 8.7 (L) 05/29/2016 1425   CALCIUM 8.5 (L) 03/17/2015 1425   ALKPHOS 86 05/29/2016 1425   ALKPHOS 44 03/17/2015 1425   AST 26 05/29/2016 1425   AST 25 03/17/2015 1425   ALT 20 05/29/2016 1425   ALT 23 03/17/2015 1425   BILITOT 1.2 05/29/2016 1425   BILITOT 0.6 03/17/2015 1425       ASSESSMENT & PLAN:   ITP (idiopathic thrombocytopenic purpura) # ITP- steroid responsive; relapsed currently s/p Ritixan appx 4 week ago [February 8]. Currently on third line therapy with N-plate; today platelets are 290.  # Hemolytic anemia- on prednisone 60mg  for 3 weeks- improving; 9.6. Drop to 40mg /d. Check weekly cbc/ldh. Discussed if he isn't able to be weaned off prednisone- would recommend imuran or cellcept.   # ? Hematuria- awaiting urology eval at [facility]   # Patient will have weekly labs/ n-plate.  Follow-up with me in 3 months.     [name], MD 07/18/2016 8:13 AM

## 2016-07-17 NOTE — Assessment & Plan Note (Addendum)
#   ITP- steroid responsive; relapsed currently s/p Ritixan appx 4 week ago [February 8]. Currently on third line therapy with N-plate; today platelets are 290.  # Hemolytic anemia- on prednisone 60mg  for 3 weeks- improving; 9.6. Drop to 40mg /d. Check weekly cbc/ldh. Discussed if he isn't able to be weaned off prednisone- would recommend imuran or cellcept.   # ? Hematuria- awaiting urology eval at [facility].   # Patient will have weekly labs/ n-plate.  Follow-up with me in 3 months.

## 2016-07-19 ENCOUNTER — Telehealth: Payer: Self-pay | Admitting: *Deleted

## 2016-07-19 NOTE — Telephone Encounter (Signed)
Called to states that he needs to have a tooth extracted Tuesday and is asking if that is alright. He will take prophylactic abx due to knee replacement

## 2016-07-19 NOTE — Telephone Encounter (Signed)
OK per Dr Rogue Bussing to have tooth extracted

## 2016-07-20 DIAGNOSIS — R300 Dysuria: Secondary | ICD-10-CM | POA: Diagnosis not present

## 2016-07-20 DIAGNOSIS — R55 Syncope and collapse: Secondary | ICD-10-CM | POA: Diagnosis not present

## 2016-07-20 DIAGNOSIS — I10 Essential (primary) hypertension: Secondary | ICD-10-CM | POA: Diagnosis not present

## 2016-07-20 DIAGNOSIS — R338 Other retention of urine: Secondary | ICD-10-CM | POA: Diagnosis not present

## 2016-07-20 DIAGNOSIS — Z862 Personal history of diseases of the blood and blood-forming organs and certain disorders involving the immune mechanism: Secondary | ICD-10-CM | POA: Diagnosis not present

## 2016-07-20 DIAGNOSIS — C61 Malignant neoplasm of prostate: Secondary | ICD-10-CM | POA: Diagnosis not present

## 2016-07-20 DIAGNOSIS — E7800 Pure hypercholesterolemia, unspecified: Secondary | ICD-10-CM | POA: Diagnosis not present

## 2016-07-20 DIAGNOSIS — D590 Drug-induced autoimmune hemolytic anemia: Secondary | ICD-10-CM | POA: Diagnosis not present

## 2016-07-25 ENCOUNTER — Inpatient Hospital Stay: Payer: PPO | Attending: Internal Medicine

## 2016-07-25 ENCOUNTER — Telehealth: Payer: Self-pay

## 2016-07-25 ENCOUNTER — Other Ambulatory Visit: Payer: Self-pay

## 2016-07-25 ENCOUNTER — Inpatient Hospital Stay: Payer: PPO

## 2016-07-25 DIAGNOSIS — E7800 Pure hypercholesterolemia, unspecified: Secondary | ICD-10-CM | POA: Diagnosis not present

## 2016-07-25 DIAGNOSIS — R319 Hematuria, unspecified: Secondary | ICD-10-CM | POA: Diagnosis not present

## 2016-07-25 DIAGNOSIS — Z8546 Personal history of malignant neoplasm of prostate: Secondary | ICD-10-CM | POA: Diagnosis not present

## 2016-07-25 DIAGNOSIS — Z79899 Other long term (current) drug therapy: Secondary | ICD-10-CM | POA: Diagnosis not present

## 2016-07-25 DIAGNOSIS — D693 Immune thrombocytopenic purpura: Secondary | ICD-10-CM | POA: Insufficient documentation

## 2016-07-25 DIAGNOSIS — I10 Essential (primary) hypertension: Secondary | ICD-10-CM | POA: Insufficient documentation

## 2016-07-25 DIAGNOSIS — D589 Hereditary hemolytic anemia, unspecified: Secondary | ICD-10-CM | POA: Insufficient documentation

## 2016-07-25 LAB — CBC WITH DIFFERENTIAL/PLATELET
Basophils Absolute: 0 10*3/uL (ref 0–0.1)
Basophils Relative: 0 %
Eosinophils Absolute: 0 10*3/uL (ref 0–0.7)
Eosinophils Relative: 0 %
HEMATOCRIT: 27.8 % — AB (ref 40.0–52.0)
HEMOGLOBIN: 10 g/dL — AB (ref 13.0–18.0)
LYMPHS ABS: 0.3 10*3/uL — AB (ref 1.0–3.6)
LYMPHS PCT: 3 %
MCH: 38.5 pg — AB (ref 26.0–34.0)
MCHC: 36.1 g/dL — ABNORMAL HIGH (ref 32.0–36.0)
MCV: 106.8 fL — AB (ref 80.0–100.0)
Monocytes Absolute: 0.1 10*3/uL — ABNORMAL LOW (ref 0.2–1.0)
Monocytes Relative: 1 %
NEUTROS PCT: 96 %
Neutro Abs: 9.4 10*3/uL — ABNORMAL HIGH (ref 1.4–6.5)
Platelets: 552 10*3/uL — ABNORMAL HIGH (ref 150–440)
RBC: 2.61 MIL/uL — AB (ref 4.40–5.90)
RDW: 13.8 % (ref 11.5–14.5)
WBC: 9.9 10*3/uL (ref 3.8–10.6)

## 2016-07-25 LAB — LACTATE DEHYDROGENASE: LDH: 250 U/L — ABNORMAL HIGH (ref 98–192)

## 2016-07-25 NOTE — Telephone Encounter (Signed)
Called and spoke with pt's wife.  Informed wife perMD patient is to cut down the prednisone to 20 mg once a day/continue checking labs weekly. Pt's wife verbalized an understanding.  No other concerns noted.

## 2016-07-25 NOTE — Progress Notes (Unsigned)
Hold Nplate today per Dr. Rogue Bussing.

## 2016-07-31 ENCOUNTER — Inpatient Hospital Stay: Payer: PPO

## 2016-07-31 DIAGNOSIS — D693 Immune thrombocytopenic purpura: Secondary | ICD-10-CM | POA: Diagnosis not present

## 2016-07-31 LAB — CBC WITH DIFFERENTIAL/PLATELET
Basophils Absolute: 0 10*3/uL (ref 0–0.1)
Basophils Relative: 0 %
EOS ABS: 0 10*3/uL (ref 0–0.7)
EOS PCT: 0 %
HCT: 28.7 % — ABNORMAL LOW (ref 40.0–52.0)
HEMOGLOBIN: 10.3 g/dL — AB (ref 13.0–18.0)
LYMPHS ABS: 0.5 10*3/uL — AB (ref 1.0–3.6)
Lymphocytes Relative: 6 %
MCH: 37.7 pg — AB (ref 26.0–34.0)
MCHC: 36 g/dL (ref 32.0–36.0)
MCV: 104.8 fL — ABNORMAL HIGH (ref 80.0–100.0)
MONOS PCT: 2 %
Monocytes Absolute: 0.2 10*3/uL (ref 0.2–1.0)
Neutro Abs: 8.1 10*3/uL — ABNORMAL HIGH (ref 1.4–6.5)
Neutrophils Relative %: 92 %
Platelets: 525 10*3/uL — ABNORMAL HIGH (ref 150–440)
RBC: 2.74 MIL/uL — ABNORMAL LOW (ref 4.40–5.90)
RDW: 13.7 % (ref 11.5–14.5)
WBC: 8.9 10*3/uL (ref 3.8–10.6)

## 2016-07-31 LAB — LACTATE DEHYDROGENASE: LDH: 240 U/L — AB (ref 98–192)

## 2016-08-01 ENCOUNTER — Telehealth: Payer: Self-pay | Admitting: *Deleted

## 2016-08-01 NOTE — Telephone Encounter (Signed)
Contacted patient. Spoke with patient and his wife [name].  Patient currently taking 20 mg of prednisone since last week.  md informed.  Keep patient on same dose. Recheck labs as scheduled. Pt states that he has an apt next week for lab check.  Teach back process performed with patient and his wife.

## 2016-08-01 NOTE — Telephone Encounter (Signed)
-----   Message from [name], MD sent at 08/01/2016  1:31 PM EDT ----- Please confirm the dose of prednisone with the patient; if he is on 40 mg- cut down to 20 mg; continue weekly labs.Thx

## 2016-08-07 ENCOUNTER — Telehealth: Payer: Self-pay

## 2016-08-07 ENCOUNTER — Inpatient Hospital Stay: Payer: PPO

## 2016-08-07 DIAGNOSIS — D693 Immune thrombocytopenic purpura: Secondary | ICD-10-CM | POA: Diagnosis not present

## 2016-08-07 LAB — CBC WITH DIFFERENTIAL/PLATELET
Basophils Absolute: 0 10*3/uL (ref 0–0.1)
Basophils Relative: 0 %
Eosinophils Absolute: 0 10*3/uL (ref 0–0.7)
Eosinophils Relative: 0 %
HEMATOCRIT: 29.3 % — AB (ref 40.0–52.0)
HEMOGLOBIN: 10.6 g/dL — AB (ref 13.0–18.0)
LYMPHS ABS: 0.5 10*3/uL — AB (ref 1.0–3.6)
Lymphocytes Relative: 6 %
MCH: 37.2 pg — AB (ref 26.0–34.0)
MCHC: 36.1 g/dL — AB (ref 32.0–36.0)
MCV: 103 fL — ABNORMAL HIGH (ref 80.0–100.0)
MONO ABS: 0.2 10*3/uL (ref 0.2–1.0)
MONOS PCT: 3 %
NEUTROS ABS: 7.5 10*3/uL — AB (ref 1.4–6.5)
NEUTROS PCT: 91 %
Platelets: 279 10*3/uL (ref 150–440)
RBC: 2.84 MIL/uL — ABNORMAL LOW (ref 4.40–5.90)
RDW: 13.9 % (ref 11.5–14.5)
WBC: 8.2 10*3/uL (ref 3.8–10.6)

## 2016-08-07 LAB — LACTATE DEHYDROGENASE: LDH: 225 U/L — ABNORMAL HIGH (ref 98–192)

## 2016-08-07 MED ORDER — ROMIPLOSTIM 250 MCG ~~LOC~~ SOLR
80.0000 ug | Freq: Once | SUBCUTANEOUS | Status: AC
  Administered 2016-08-07: 80 ug via SUBCUTANEOUS
  Filled 2016-08-07: qty 0.16

## 2016-08-07 NOTE — Telephone Encounter (Signed)
Spoke with md. Approved both vaccination.  Wife made aware

## 2016-08-07 NOTE — Telephone Encounter (Signed)
Patient's wife is asking if OK for patient to receive the flu and Prevnar 13 immunizations.  If so, she is planning on getting them at their pharmacy ([facility]).

## 2016-08-07 NOTE — Progress Notes (Signed)
Confirmed with Dr. Rogue Bussing that patient is to receive Nplate today with platelet results of 279.  Pharmacy also informed.

## 2016-08-07 NOTE — Progress Notes (Signed)
Plt =279.  MD wants to give NPlate today.

## 2016-08-08 ENCOUNTER — Telehealth: Payer: Self-pay | Admitting: *Deleted

## 2016-08-08 ENCOUNTER — Other Ambulatory Visit: Payer: Self-pay | Admitting: Internal Medicine

## 2016-08-08 NOTE — Telephone Encounter (Signed)
-----   Message from [name], MD sent at 08/07/2016  6:43 PM EDT ----- Informed patient to continue prednisone 20 mg once a day until further instructions. Continue weekly labs. Thx

## 2016-08-08 NOTE — Telephone Encounter (Signed)
Left vm for patient to prednisone dosing per md directions. Asked pt to call our [facility] back to confirm msg was rcvd

## 2016-08-14 ENCOUNTER — Inpatient Hospital Stay: Payer: PPO

## 2016-08-14 DIAGNOSIS — D693 Immune thrombocytopenic purpura: Secondary | ICD-10-CM | POA: Diagnosis not present

## 2016-08-14 LAB — CBC WITH DIFFERENTIAL/PLATELET
Basophils Absolute: 0 10*3/uL (ref 0–0.1)
Basophils Relative: 0 %
Eosinophils Absolute: 0 10*3/uL (ref 0–0.7)
Eosinophils Relative: 0 %
HEMATOCRIT: 30.7 % — AB (ref 40.0–52.0)
HEMOGLOBIN: 11.2 g/dL — AB (ref 13.0–18.0)
LYMPHS ABS: 0.6 10*3/uL — AB (ref 1.0–3.6)
LYMPHS PCT: 8 %
MCH: 37.1 pg — AB (ref 26.0–34.0)
MCHC: 36.4 g/dL — AB (ref 32.0–36.0)
MCV: 101.9 fL — ABNORMAL HIGH (ref 80.0–100.0)
MONOS PCT: 3 %
Monocytes Absolute: 0.2 10*3/uL (ref 0.2–1.0)
NEUTROS ABS: 7.7 10*3/uL — AB (ref 1.4–6.5)
NEUTROS PCT: 89 %
Platelets: 314 10*3/uL (ref 150–440)
RBC: 3.01 MIL/uL — AB (ref 4.40–5.90)
RDW: 14.5 % (ref 11.5–14.5)
WBC: 8.6 10*3/uL (ref 3.8–10.6)

## 2016-08-14 LAB — LACTATE DEHYDROGENASE: LDH: 224 U/L — ABNORMAL HIGH (ref 98–192)

## 2016-08-14 NOTE — Progress Notes (Signed)
Platelets 314 today. Spoke with Dr Rogue Bussing. No Nplate today

## 2016-08-15 DIAGNOSIS — Z961 Presence of intraocular lens: Secondary | ICD-10-CM | POA: Diagnosis not present

## 2016-08-15 DIAGNOSIS — H3321 Serous retinal detachment, right eye: Secondary | ICD-10-CM | POA: Diagnosis not present

## 2016-08-15 DIAGNOSIS — H3323 Serous retinal detachment, bilateral: Secondary | ICD-10-CM | POA: Diagnosis not present

## 2016-08-15 DIAGNOSIS — H40051 Ocular hypertension, right eye: Secondary | ICD-10-CM | POA: Diagnosis not present

## 2016-08-15 DIAGNOSIS — H43811 Vitreous degeneration, right eye: Secondary | ICD-10-CM | POA: Diagnosis not present

## 2016-08-21 ENCOUNTER — Inpatient Hospital Stay: Payer: PPO | Attending: Internal Medicine

## 2016-08-21 ENCOUNTER — Inpatient Hospital Stay: Payer: PPO

## 2016-08-21 DIAGNOSIS — D693 Immune thrombocytopenic purpura: Secondary | ICD-10-CM | POA: Diagnosis not present

## 2016-08-21 LAB — CBC WITH DIFFERENTIAL/PLATELET
Basophils Absolute: 0 10*3/uL (ref 0–0.1)
Basophils Relative: 0 %
EOS ABS: 0 10*3/uL (ref 0–0.7)
EOS PCT: 0 %
HCT: 31.5 % — ABNORMAL LOW (ref 40.0–52.0)
Hemoglobin: 11.4 g/dL — ABNORMAL LOW (ref 13.0–18.0)
LYMPHS ABS: 0.5 10*3/uL — AB (ref 1.0–3.6)
LYMPHS PCT: 6 %
MCH: 36.5 pg — AB (ref 26.0–34.0)
MCHC: 36.1 g/dL — ABNORMAL HIGH (ref 32.0–36.0)
MCV: 101.1 fL — AB (ref 80.0–100.0)
MONOS PCT: 3 %
Monocytes Absolute: 0.2 10*3/uL (ref 0.2–1.0)
Neutro Abs: 7.8 10*3/uL — ABNORMAL HIGH (ref 1.4–6.5)
Neutrophils Relative %: 91 %
PLATELETS: 529 10*3/uL — AB (ref 150–440)
RBC: 3.12 MIL/uL — AB (ref 4.40–5.90)
RDW: 14 % (ref 11.5–14.5)
WBC: 8.6 10*3/uL (ref 3.8–10.6)

## 2016-08-21 LAB — LACTATE DEHYDROGENASE: LDH: 218 U/L — AB (ref 98–192)

## 2016-08-28 ENCOUNTER — Inpatient Hospital Stay: Payer: PPO

## 2016-08-28 ENCOUNTER — Other Ambulatory Visit: Payer: Self-pay

## 2016-08-28 DIAGNOSIS — D693 Immune thrombocytopenic purpura: Secondary | ICD-10-CM | POA: Diagnosis not present

## 2016-08-28 LAB — CBC WITH DIFFERENTIAL/PLATELET
BASOS PCT: 0 %
Basophils Absolute: 0 10*3/uL (ref 0–0.1)
EOS ABS: 0 10*3/uL (ref 0–0.7)
Eosinophils Relative: 0 %
HCT: 31.2 % — ABNORMAL LOW (ref 40.0–52.0)
HEMOGLOBIN: 11.1 g/dL — AB (ref 13.0–18.0)
LYMPHS ABS: 0.7 10*3/uL — AB (ref 1.0–3.6)
Lymphocytes Relative: 7 %
MCH: 35.3 pg — AB (ref 26.0–34.0)
MCHC: 35.7 g/dL (ref 32.0–36.0)
MCV: 98.9 fL (ref 80.0–100.0)
Monocytes Absolute: 0.2 10*3/uL (ref 0.2–1.0)
Monocytes Relative: 2 %
NEUTROS PCT: 91 %
Neutro Abs: 8.1 10*3/uL — ABNORMAL HIGH (ref 1.4–6.5)
Platelets: 419 10*3/uL (ref 150–440)
RBC: 3.15 MIL/uL — AB (ref 4.40–5.90)
RDW: 14 % (ref 11.5–14.5)
WBC: 8.9 10*3/uL (ref 3.8–10.6)

## 2016-08-28 LAB — LACTATE DEHYDROGENASE: LDH: 208 U/L — AB (ref 98–192)

## 2016-08-29 ENCOUNTER — Telehealth: Payer: Self-pay

## 2016-08-29 NOTE — Telephone Encounter (Signed)
Called and spoke with pt and wife regarding prednisone.  Per Dr. B informed pt to cut down the dose of prednisone to 10 mg until further instructions; and continue weekly labs.  Pt and wife verbalized an understanding.  No other concerns noted.

## 2016-09-04 ENCOUNTER — Inpatient Hospital Stay: Payer: PPO

## 2016-09-04 DIAGNOSIS — D693 Immune thrombocytopenic purpura: Secondary | ICD-10-CM

## 2016-09-04 LAB — CBC WITH DIFFERENTIAL/PLATELET
BASOS PCT: 0 %
Basophils Absolute: 0 10*3/uL (ref 0–0.1)
EOS ABS: 0 10*3/uL (ref 0–0.7)
Eosinophils Relative: 0 %
HCT: 33.9 % — ABNORMAL LOW (ref 40.0–52.0)
HEMOGLOBIN: 12.3 g/dL — AB (ref 13.0–18.0)
Lymphocytes Relative: 7 %
Lymphs Abs: 0.7 10*3/uL — ABNORMAL LOW (ref 1.0–3.6)
MCH: 36 pg — ABNORMAL HIGH (ref 26.0–34.0)
MCHC: 36.2 g/dL — ABNORMAL HIGH (ref 32.0–36.0)
MCV: 99.4 fL (ref 80.0–100.0)
Monocytes Absolute: 0.5 10*3/uL (ref 0.2–1.0)
Monocytes Relative: 5 %
NEUTROS PCT: 88 %
Neutro Abs: 8.7 10*3/uL — ABNORMAL HIGH (ref 1.4–6.5)
PLATELETS: 271 10*3/uL (ref 150–440)
RBC: 3.41 MIL/uL — AB (ref 4.40–5.90)
RDW: 14.3 % (ref 11.5–14.5)
WBC: 10 10*3/uL (ref 3.8–10.6)

## 2016-09-04 LAB — LACTATE DEHYDROGENASE: LDH: 237 U/L — AB (ref 98–192)

## 2016-09-05 ENCOUNTER — Telehealth: Payer: Self-pay | Admitting: *Deleted

## 2016-09-05 DIAGNOSIS — D693 Immune thrombocytopenic purpura: Secondary | ICD-10-CM

## 2016-09-05 MED ORDER — PREDNISONE 10 MG PO TABS
10.0000 mg | ORAL_TABLET | Freq: Every day | ORAL | 4 refills | Status: DC
Start: 2016-09-05 — End: 2016-11-27

## 2016-09-05 NOTE — Telephone Encounter (Signed)
-----   Message from [name], MD sent at 09/05/2016  8:10 AM EDT ----- Please inform pt that hb- is improving; Continue prednisone 10 mg until further labs/ instructions. Thx

## 2016-09-05 NOTE — Telephone Encounter (Signed)
Contacted patient's wife [name]. Explained that her husband's hgg is improving. She was instructed to have her husband continue taking prednisone 10 mg until further labs/ instructions. He will continue with weekly labs.  She asked that a RF be sent to pharmacy. V/o Dr. Rogue Bussing to send renewal for prednisone 10 mg daily #30.

## 2016-09-11 ENCOUNTER — Inpatient Hospital Stay: Payer: PPO

## 2016-09-11 DIAGNOSIS — D693 Immune thrombocytopenic purpura: Secondary | ICD-10-CM

## 2016-09-11 LAB — CBC WITH DIFFERENTIAL/PLATELET
BASOS ABS: 0 10*3/uL (ref 0–0.1)
BASOS PCT: 0 %
Eosinophils Absolute: 0 10*3/uL (ref 0–0.7)
Eosinophils Relative: 0 %
HEMATOCRIT: 31 % — AB (ref 40.0–52.0)
HEMOGLOBIN: 11.1 g/dL — AB (ref 13.0–18.0)
Lymphocytes Relative: 9 %
Lymphs Abs: 0.8 10*3/uL — ABNORMAL LOW (ref 1.0–3.6)
MCH: 35.2 pg — ABNORMAL HIGH (ref 26.0–34.0)
MCHC: 35.7 g/dL (ref 32.0–36.0)
MCV: 98.6 fL (ref 80.0–100.0)
Monocytes Absolute: 0.4 10*3/uL (ref 0.2–1.0)
Monocytes Relative: 5 %
NEUTROS ABS: 7.5 10*3/uL — AB (ref 1.4–6.5)
NEUTROS PCT: 86 %
Platelets: 232 10*3/uL (ref 150–440)
RBC: 3.14 MIL/uL — AB (ref 4.40–5.90)
RDW: 14.6 % — ABNORMAL HIGH (ref 11.5–14.5)
WBC: 8.8 10*3/uL (ref 3.8–10.6)

## 2016-09-11 LAB — LACTATE DEHYDROGENASE: LDH: 227 U/L — ABNORMAL HIGH (ref 98–192)

## 2016-09-18 ENCOUNTER — Inpatient Hospital Stay: Payer: PPO

## 2016-09-18 DIAGNOSIS — D693 Immune thrombocytopenic purpura: Secondary | ICD-10-CM

## 2016-09-18 LAB — CBC WITH DIFFERENTIAL/PLATELET
Basophils Absolute: 0 10*3/uL (ref 0–0.1)
Basophils Relative: 1 %
EOS ABS: 0 10*3/uL (ref 0–0.7)
Eosinophils Relative: 0 %
HCT: 32.3 % — ABNORMAL LOW (ref 40.0–52.0)
HEMOGLOBIN: 11.5 g/dL — AB (ref 13.0–18.0)
LYMPHS ABS: 0.8 10*3/uL — AB (ref 1.0–3.6)
LYMPHS PCT: 9 %
MCH: 35.1 pg — AB (ref 26.0–34.0)
MCHC: 35.6 g/dL (ref 32.0–36.0)
MCV: 98.6 fL (ref 80.0–100.0)
Monocytes Absolute: 0.3 10*3/uL (ref 0.2–1.0)
Monocytes Relative: 4 %
NEUTROS PCT: 86 %
Neutro Abs: 7.9 10*3/uL — ABNORMAL HIGH (ref 1.4–6.5)
Platelets: 292 10*3/uL (ref 150–440)
RBC: 3.27 MIL/uL — AB (ref 4.40–5.90)
RDW: 14.7 % — ABNORMAL HIGH (ref 11.5–14.5)
WBC: 9.1 10*3/uL (ref 3.8–10.6)

## 2016-09-18 LAB — LACTATE DEHYDROGENASE: LDH: 241 U/L — AB (ref 98–192)

## 2016-09-21 DIAGNOSIS — E7800 Pure hypercholesterolemia, unspecified: Secondary | ICD-10-CM | POA: Diagnosis not present

## 2016-09-21 DIAGNOSIS — Z125 Encounter for screening for malignant neoplasm of prostate: Secondary | ICD-10-CM | POA: Diagnosis not present

## 2016-09-21 DIAGNOSIS — Z79899 Other long term (current) drug therapy: Secondary | ICD-10-CM | POA: Diagnosis not present

## 2016-09-25 ENCOUNTER — Inpatient Hospital Stay: Payer: PPO

## 2016-09-25 ENCOUNTER — Inpatient Hospital Stay: Payer: PPO | Attending: Internal Medicine

## 2016-09-25 DIAGNOSIS — D591 Other autoimmune hemolytic anemias: Secondary | ICD-10-CM | POA: Diagnosis not present

## 2016-09-25 DIAGNOSIS — D72829 Elevated white blood cell count, unspecified: Secondary | ICD-10-CM | POA: Insufficient documentation

## 2016-09-25 DIAGNOSIS — E7800 Pure hypercholesterolemia, unspecified: Secondary | ICD-10-CM | POA: Diagnosis not present

## 2016-09-25 DIAGNOSIS — I10 Essential (primary) hypertension: Secondary | ICD-10-CM | POA: Insufficient documentation

## 2016-09-25 DIAGNOSIS — Z79899 Other long term (current) drug therapy: Secondary | ICD-10-CM | POA: Insufficient documentation

## 2016-09-25 DIAGNOSIS — D693 Immune thrombocytopenic purpura: Secondary | ICD-10-CM | POA: Diagnosis not present

## 2016-09-25 DIAGNOSIS — R319 Hematuria, unspecified: Secondary | ICD-10-CM | POA: Diagnosis not present

## 2016-09-25 LAB — CBC WITH DIFFERENTIAL/PLATELET
BASOS ABS: 0 10*3/uL (ref 0–0.1)
Basophils Relative: 1 %
EOS ABS: 0.1 10*3/uL (ref 0–0.7)
EOS PCT: 1 %
HCT: 30.8 % — ABNORMAL LOW (ref 40.0–52.0)
HEMOGLOBIN: 11.3 g/dL — AB (ref 13.0–18.0)
LYMPHS PCT: 8 %
Lymphs Abs: 0.8 10*3/uL — ABNORMAL LOW (ref 1.0–3.6)
MCH: 36 pg — ABNORMAL HIGH (ref 26.0–34.0)
MCHC: 36.6 g/dL — ABNORMAL HIGH (ref 32.0–36.0)
MCV: 98.3 fL (ref 80.0–100.0)
Monocytes Absolute: 0.4 10*3/uL (ref 0.2–1.0)
Monocytes Relative: 5 %
NEUTROS PCT: 85 %
Neutro Abs: 8 10*3/uL — ABNORMAL HIGH (ref 1.4–6.5)
PLATELETS: 294 10*3/uL (ref 150–440)
RBC: 3.13 MIL/uL — AB (ref 4.40–5.90)
RDW: 15 % — ABNORMAL HIGH (ref 11.5–14.5)
WBC: 9.3 10*3/uL (ref 3.8–10.6)

## 2016-09-25 LAB — LACTATE DEHYDROGENASE: LDH: 261 U/L — AB (ref 98–192)

## 2016-09-28 DIAGNOSIS — Z0000 Encounter for general adult medical examination without abnormal findings: Secondary | ICD-10-CM | POA: Diagnosis not present

## 2016-10-02 ENCOUNTER — Inpatient Hospital Stay: Payer: PPO

## 2016-10-02 DIAGNOSIS — D693 Immune thrombocytopenic purpura: Secondary | ICD-10-CM | POA: Diagnosis not present

## 2016-10-02 LAB — CBC WITH DIFFERENTIAL/PLATELET
BASOS ABS: 0 10*3/uL (ref 0–0.1)
Basophils Relative: 0 %
EOS ABS: 0.1 10*3/uL (ref 0–0.7)
EOS PCT: 1 %
HCT: 31.7 % — ABNORMAL LOW (ref 40.0–52.0)
Hemoglobin: 11.3 g/dL — ABNORMAL LOW (ref 13.0–18.0)
LYMPHS ABS: 0.7 10*3/uL — AB (ref 1.0–3.6)
LYMPHS PCT: 8 %
MCH: 35.4 pg — AB (ref 26.0–34.0)
MCHC: 35.6 g/dL (ref 32.0–36.0)
MCV: 99.5 fL (ref 80.0–100.0)
MONO ABS: 0.4 10*3/uL (ref 0.2–1.0)
Monocytes Relative: 5 %
Neutro Abs: 8 10*3/uL — ABNORMAL HIGH (ref 1.4–6.5)
Neutrophils Relative %: 86 %
PLATELETS: 239 10*3/uL (ref 150–440)
RBC: 3.19 MIL/uL — ABNORMAL LOW (ref 4.40–5.90)
RDW: 15.4 % — AB (ref 11.5–14.5)
WBC: 9.2 10*3/uL (ref 3.8–10.6)

## 2016-10-02 LAB — LACTATE DEHYDROGENASE: LDH: 285 U/L — ABNORMAL HIGH (ref 98–192)

## 2016-10-09 ENCOUNTER — Inpatient Hospital Stay: Payer: PPO

## 2016-10-09 VITALS — BP 116/62 | HR 73 | Temp 98.6°F | Resp 18

## 2016-10-09 DIAGNOSIS — D693 Immune thrombocytopenic purpura: Secondary | ICD-10-CM

## 2016-10-09 LAB — CBC WITH DIFFERENTIAL/PLATELET
BASOS ABS: 0 10*3/uL (ref 0–0.1)
BASOS PCT: 0 %
EOS PCT: 0 %
Eosinophils Absolute: 0 10*3/uL (ref 0–0.7)
HCT: 29.2 % — ABNORMAL LOW (ref 40.0–52.0)
Hemoglobin: 10.6 g/dL — ABNORMAL LOW (ref 13.0–18.0)
Lymphocytes Relative: 8 %
Lymphs Abs: 0.9 10*3/uL — ABNORMAL LOW (ref 1.0–3.6)
MCH: 38.3 pg — ABNORMAL HIGH (ref 26.0–34.0)
MCHC: 36.2 g/dL — AB (ref 32.0–36.0)
MCV: 105.8 fL — AB (ref 80.0–100.0)
MONO ABS: 0.4 10*3/uL (ref 0.2–1.0)
Monocytes Relative: 4 %
Neutro Abs: 9.5 10*3/uL — ABNORMAL HIGH (ref 1.4–6.5)
Neutrophils Relative %: 88 %
PLATELETS: 186 10*3/uL (ref 150–440)
RBC: 2.75 MIL/uL — ABNORMAL LOW (ref 4.40–5.90)
RDW: 15.6 % — AB (ref 11.5–14.5)
WBC: 10.9 10*3/uL — ABNORMAL HIGH (ref 3.8–10.6)

## 2016-10-09 LAB — LACTATE DEHYDROGENASE: LDH: 314 U/L — ABNORMAL HIGH (ref 98–192)

## 2016-10-09 MED ORDER — ROMIPLOSTIM 250 MCG ~~LOC~~ SOLR
80.0000 ug | Freq: Once | SUBCUTANEOUS | Status: AC
Start: 2016-10-09 — End: 2016-10-09
  Administered 2016-10-09: 80 ug via SUBCUTANEOUS
  Filled 2016-10-09: qty 0.16

## 2016-10-16 ENCOUNTER — Inpatient Hospital Stay: Payer: PPO

## 2016-10-16 ENCOUNTER — Inpatient Hospital Stay (HOSPITAL_BASED_OUTPATIENT_CLINIC_OR_DEPARTMENT_OTHER): Payer: PPO | Admitting: Internal Medicine

## 2016-10-16 VITALS — BP 95/53 | HR 84 | Temp 98.2°F | Wt 171.0 lb

## 2016-10-16 DIAGNOSIS — D693 Immune thrombocytopenic purpura: Secondary | ICD-10-CM

## 2016-10-16 DIAGNOSIS — D591 Other autoimmune hemolytic anemias: Secondary | ICD-10-CM

## 2016-10-16 DIAGNOSIS — R319 Hematuria, unspecified: Secondary | ICD-10-CM | POA: Diagnosis not present

## 2016-10-16 DIAGNOSIS — Z79899 Other long term (current) drug therapy: Secondary | ICD-10-CM

## 2016-10-16 LAB — CBC WITH DIFFERENTIAL/PLATELET
BASOS ABS: 0 10*3/uL (ref 0–0.1)
BASOS PCT: 1 %
EOS PCT: 1 %
Eosinophils Absolute: 0.1 10*3/uL (ref 0–0.7)
HCT: 27.8 % — ABNORMAL LOW (ref 40.0–52.0)
Hemoglobin: 10.3 g/dL — ABNORMAL LOW (ref 13.0–18.0)
Lymphocytes Relative: 8 %
Lymphs Abs: 0.6 10*3/uL — ABNORMAL LOW (ref 1.0–3.6)
MCH: 41.5 pg — ABNORMAL HIGH (ref 26.0–34.0)
MCHC: 37 g/dL — ABNORMAL HIGH (ref 32.0–36.0)
MCV: 112.2 fL — AB (ref 80.0–100.0)
MONO ABS: 0.4 10*3/uL (ref 0.2–1.0)
Monocytes Relative: 4 %
Neutro Abs: 7.3 10*3/uL — ABNORMAL HIGH (ref 1.4–6.5)
Neutrophils Relative %: 86 %
PLATELETS: 271 10*3/uL (ref 150–440)
RBC: 2.47 MIL/uL — ABNORMAL LOW (ref 4.40–5.90)
RDW: 15.7 % — AB (ref 11.5–14.5)
WBC: 8.4 10*3/uL (ref 3.8–10.6)

## 2016-10-16 LAB — LACTATE DEHYDROGENASE: LDH: 348 U/L — ABNORMAL HIGH (ref 98–192)

## 2016-10-16 NOTE — Progress Notes (Signed)
[name] OFFICE PROGRESS NOTE  Patient Care Team: Derinda Late, MD as PCP - General (Family Medicine)   SUMMARY OF ONCOLOGIC HISTORY:  #FEB 2016-  ITP  s/p Prednisone; OCT 2016- Relapse of ITP- Prednisone; JAN 13th- START RITUXAN q W x4 [finished Feb 8th]; March 10th 2017- N-plate q W- good response  # Feb 2016- AUTOIMMUNE HEMOLYTIC ANEMIA; Relapsed AUG 2017- Prednisone; NOV 27th Start cellcept 500 BID.   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   Patient currently dong well. Denies any  Unusual fatigue.  He is being playing golf.  Otherwise no nausea no vomiting. No dizziness. No difficulty swallowing or pain while swallowing.    REVIEW OF SYSTEMS:  A complete 10 point review of system is done which is negative except mentioned above/history of present illness.   PAST MEDICAL HISTORY :  Past Medical History:  Diagnosis Date  . Autoimmune hemolytic anemia (HCC)   . Dehydration   . Detached retina   . Hypercholesteremia   . Hypertension   . Insomnia   . 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 Leukocytosis   . Prostate cancer ([place])   . Shingles     PAST SURGICAL HISTORY :   Past Surgical History:  Procedure Laterality Date  . artificial left eye    . EYE SURGERY    . HERNIA REPAIR    . JOINT REPLACEMENT    . PROSTATE SURGERY      FAMILY HISTORY :   Family History  Problem Relation Age of Onset  . Cancer Mother   . Multiple sclerosis Father     SOCIAL HISTORY:   Social History  Substance Use Topics  . Smoking status: Never Smoker  . Smokeless tobacco: Not on file  . Alcohol use No    ALLERGIES:  is allergic to cephalexin and  hydrocodone-acetaminophen.  MEDICATIONS:  Current Outpatient Prescriptions  Medication Sig Dispense Refill  . acetaminophen (TYLENOL) 500 MG tablet Take 500 mg by mouth every 6 (six) hours as needed for mild pain. Reported on 01/24/2016    . citalopram (CELEXA) 10 MG tablet Take 10 mg by mouth daily.    . Cyanocobalamin (RA VITAMIN B-12 TR) 1000 MCG TBCR Take 1,000 mcg by mouth daily.    . feeding supplement, ENSURE ENLIVE, (ENSURE ENLIVE) LIQD Take 237 mLs by mouth 2 (two) times daily between meals. 237 mL 12  . furosemide (LASIX) 20 MG tablet Take 20 mg by mouth daily.    Marland Kitchen gabapentin (NEURONTIN) 100 MG capsule Take 300 mg by mouth at bedtime. Reported on 12/03/2015    . lisinopril (PRINIVIL,ZESTRIL) 20 MG tablet Take 20 mg by mouth every morning.     Marland Kitchen omeprazole (PRILOSEC) 40 MG capsule TAKE 1 CAPSULE BY MOUTH USUALLY 30 MINUTES BEFORE BREAKFAST 30 capsule 3  . polyethylene glycol (MIRALAX / GLYCOLAX) packet Take 17 g by mouth daily as needed for mild constipation. 14 each 0  . polyvinyl alcohol (LIQUIFILM TEARS) 1.4 % ophthalmic solution Place 2 drops into the left eye 3 (three) times daily as needed for dry eyes. 15 mL 0  . potassium chloride SA (K-DUR,KLOR-CON) 20 MEQ tablet Take by mouth.    Marland Kitchen  pravastatin (PRAVACHOL) 20 MG tablet Take 20 mg by mouth at bedtime.     . predniSONE (DELTASONE) 10 MG tablet Take 1 tablet (10 mg total) by mouth daily with breakfast. 30 tablet 4   No current facility-administered medications for this visit.     PHYSICAL EXAMINATION:   BP (!) 95/53 (BP Location: Left Arm, Patient Position: Sitting)   Pulse 84   Temp 98.2 F (36.8 C) (Tympanic)   Wt 171 lb (77.6 kg)   BMI 27.35 kg/m   Filed Weights   10/16/16 1420  Weight: 171 lb (77.6 kg)    GENERAL: Well-nourished well-developed; Alert, no distress and comfortable.  Accompanied by his wife. EYES: no pallor or icterus OROPHARYNX: no thrush or ulceration; good dentition no bleeding noted. NECK:  supple, no masses felt LYMPH:  no palpable lymphadenopathy in the cervical, axillary or inguinal regions LUNGS: clear to auscultation and  No wheeze or crackles HEART/CVS: regular rate & rhythm and no murmurs; No lower extremity edema ABDOMEN:abdomen soft, non-tender and normal bowel sounds Musculoskeletal:no cyanosis of digits and no clubbing  PSYCH: alert & oriented x 3 with fluent speech NEURO: no focal motor/sensory deficits SKIN:  no rashes or significant lesions  LABORATORY DATA:  I have reviewed the data as listed    Component Value Date/Time   NA 133 (L) 05/29/2016 1425   NA 131 (L) 03/17/2015 1425   K 3.9 05/29/2016 1425   K 3.9 03/17/2015 1425   CL 103 05/29/2016 1425   CL 101 03/17/2015 1425   CO2 26 05/29/2016 1425   CO2 26 03/17/2015 1425   GLUCOSE 127 (H) 05/29/2016 1425   GLUCOSE 161 (H) 03/17/2015 1425   BUN 18 05/29/2016 1425   BUN 17 03/17/2015 1425   CREATININE 0.84 05/29/2016 1425   CREATININE 0.90 03/17/2015 1425   CALCIUM 8.7 (L) 05/29/2016 1425   CALCIUM 8.5 (L) 03/17/2015 1425   PROT 6.5 05/29/2016 1425   PROT 6.7 03/17/2015 1425   ALBUMIN 3.8 05/29/2016 1425   ALBUMIN 3.7 03/17/2015 1425   AST 26 05/29/2016 1425   AST 25 03/17/2015 1425   ALT 20 05/29/2016 1425   ALT 23 03/17/2015 1425   ALKPHOS 86 05/29/2016 1425   ALKPHOS 44 03/17/2015 1425   BILITOT 1.2 05/29/2016 1425   BILITOT 0.6 03/17/2015 1425   GFRNONAA >60 05/29/2016 1425   GFRNONAA >60 03/17/2015 1425   GFRAA >60 05/29/2016 1425   GFRAA >60 03/17/2015 1425    No results found for: SPEP, UPEP  Lab Results  Component Value Date   WBC 8.4 10/16/2016   NEUTROABS 7.3 (H) 10/16/2016   HGB 10.3 (L) 10/16/2016   HCT 27.8 (L) 10/16/2016   MCV 112.2 (H) 10/16/2016   PLT 271 10/16/2016      Chemistry      Component Value Date/Time   NA 133 (L) 05/29/2016 1425   NA 131 (L) 03/17/2015 1425   K 3.9 05/29/2016 1425   K 3.9 03/17/2015 1425   CL 103 05/29/2016 1425   CL 101  03/17/2015 1425   CO2 26 05/29/2016 1425   CO2 26 03/17/2015 1425   BUN 18 05/29/2016 1425   BUN 17 03/17/2015 1425   CREATININE 0.84 05/29/2016 1425   CREATININE 0.90 03/17/2015 1425      Component Value Date/Time   CALCIUM 8.7 (L) 05/29/2016 1425   CALCIUM 8.5 (L) 03/17/2015 1425   ALKPHOS 86 05/29/2016 1425   ALKPHOS 44 03/17/2015 1425   AST 26  05/29/2016 1425   AST 25 03/17/2015 1425   ALT 20 05/29/2016 1425   ALT 23 03/17/2015 1425   BILITOT 1.2 05/29/2016 1425   BILITOT 0.6 03/17/2015 1425       ASSESSMENT & PLAN:   Idiopathic thrombocytopenic purpura (HCC) # ITP- steroid responsive; relapsed Currently on third line therapy with N-plate; today platelets are 271.  Continue injectins as per potocol.  # Hemolytic anemia-  Improved on Prednisone; currently on 10mg /day; but LDH rising. Discussed long-term side fects of steroids.  Recommend slowly taper off sterids over the next 4 6weeks.  Add cellcept 500 mg BID; will ramp up slowly. Pt has prior . Continue prednisone at 10 mg a day at this time. Pt has prior script from [facility]; confirmed with pharmacy.    # ? Hematuria-  improved. awaiting Eval at [facility].  # Patient will have weekly labs/ n-plate.  Follow-up with me in 6 weeks/      [name], MD 10/17/2016 10:35 AM

## 2016-10-16 NOTE — Assessment & Plan Note (Addendum)
#   ITP- steroid responsive; relapsed Currently on third line therapy with N-plate; today platelets are 271.  Continue injectins as per potocol.  # Hemolytic anemia-  Improved on Prednisone; currently on 10mg /day; but LDH rising. Discussed long-term side fects of steroids.  Recommend slowly taper off sterids over the next 4 6weeks.  Add cellcept 500 mg BID; will ramp up slowly. Pt has prior . Continue prednisone at 10 mg a day at this time. Pt has prior script from [facility]; confirmed with pharmacy.    # ? Hematuria-  improved. awaiting Eval at [facility].  # Patient will have weekly labs/ n-plate.  Follow-up with me in 6 weeks/

## 2016-10-16 NOTE — Progress Notes (Signed)
Patient here today for follow up on Drug-induced autoimmune hemolytic anemia.  Patient has no new concerns today.

## 2016-10-18 ENCOUNTER — Ambulatory Visit: Payer: PPO | Admitting: Internal Medicine

## 2016-10-18 ENCOUNTER — Ambulatory Visit: Payer: PPO

## 2016-10-18 ENCOUNTER — Other Ambulatory Visit: Payer: PPO

## 2016-10-18 DIAGNOSIS — R399 Unspecified symptoms and signs involving the genitourinary system: Secondary | ICD-10-CM | POA: Insufficient documentation

## 2016-10-18 HISTORY — DX: Unspecified symptoms and signs involving the genitourinary system: R39.9

## 2016-10-19 DIAGNOSIS — I10 Essential (primary) hypertension: Secondary | ICD-10-CM | POA: Diagnosis not present

## 2016-10-19 DIAGNOSIS — Z862 Personal history of diseases of the blood and blood-forming organs and certain disorders involving the immune mechanism: Secondary | ICD-10-CM | POA: Diagnosis not present

## 2016-10-19 DIAGNOSIS — R399 Unspecified symptoms and signs involving the genitourinary system: Secondary | ICD-10-CM | POA: Diagnosis not present

## 2016-10-19 DIAGNOSIS — C61 Malignant neoplasm of prostate: Secondary | ICD-10-CM | POA: Diagnosis not present

## 2016-10-23 ENCOUNTER — Inpatient Hospital Stay: Payer: PPO | Attending: Internal Medicine

## 2016-10-23 ENCOUNTER — Inpatient Hospital Stay: Payer: PPO

## 2016-10-23 DIAGNOSIS — R319 Hematuria, unspecified: Secondary | ICD-10-CM | POA: Insufficient documentation

## 2016-10-23 DIAGNOSIS — D693 Immune thrombocytopenic purpura: Secondary | ICD-10-CM | POA: Diagnosis not present

## 2016-10-23 DIAGNOSIS — D72829 Elevated white blood cell count, unspecified: Secondary | ICD-10-CM | POA: Diagnosis not present

## 2016-10-23 DIAGNOSIS — Z79899 Other long term (current) drug therapy: Secondary | ICD-10-CM | POA: Insufficient documentation

## 2016-10-23 DIAGNOSIS — D591 Other autoimmune hemolytic anemias: Secondary | ICD-10-CM | POA: Insufficient documentation

## 2016-10-23 DIAGNOSIS — E7800 Pure hypercholesterolemia, unspecified: Secondary | ICD-10-CM | POA: Diagnosis not present

## 2016-10-23 DIAGNOSIS — I10 Essential (primary) hypertension: Secondary | ICD-10-CM | POA: Diagnosis not present

## 2016-10-23 LAB — CBC WITH DIFFERENTIAL/PLATELET
Basophils Absolute: 0 10*3/uL (ref 0–0.1)
Basophils Relative: 1 %
Eosinophils Absolute: 0 10*3/uL (ref 0–0.7)
Eosinophils Relative: 0 %
HEMATOCRIT: 30.8 % — AB (ref 40.0–52.0)
HEMOGLOBIN: 10.9 g/dL — AB (ref 13.0–18.0)
LYMPHS ABS: 0.9 10*3/uL — AB (ref 1.0–3.6)
LYMPHS PCT: 10 %
MCH: 36.5 pg — AB (ref 26.0–34.0)
MCHC: 35.5 g/dL (ref 32.0–36.0)
MCV: 102.7 fL — AB (ref 80.0–100.0)
MONOS PCT: 6 %
Monocytes Absolute: 0.5 10*3/uL (ref 0.2–1.0)
NEUTROS PCT: 83 %
Neutro Abs: 7.3 10*3/uL — ABNORMAL HIGH (ref 1.4–6.5)
Platelets: 386 10*3/uL (ref 150–440)
RBC: 2.99 MIL/uL — AB (ref 4.40–5.90)
RDW: 15.4 % — ABNORMAL HIGH (ref 11.5–14.5)
WBC: 8.7 10*3/uL (ref 3.8–10.6)

## 2016-10-23 LAB — LACTATE DEHYDROGENASE: LDH: 378 U/L — AB (ref 98–192)

## 2016-10-23 MED ORDER — ROMIPLOSTIM 250 MCG ~~LOC~~ SOLR: 1.0000 ug/kg | Freq: Once | SUBCUTANEOUS | Status: DC

## 2016-10-24 DIAGNOSIS — R55 Syncope and collapse: Secondary | ICD-10-CM | POA: Diagnosis not present

## 2016-10-30 ENCOUNTER — Inpatient Hospital Stay: Payer: PPO | Admitting: *Deleted

## 2016-10-30 ENCOUNTER — Telehealth: Payer: Self-pay

## 2016-10-30 ENCOUNTER — Inpatient Hospital Stay: Payer: PPO

## 2016-10-30 DIAGNOSIS — X32XXXA Exposure to sunlight, initial encounter: Secondary | ICD-10-CM | POA: Diagnosis not present

## 2016-10-30 DIAGNOSIS — D693 Immune thrombocytopenic purpura: Secondary | ICD-10-CM | POA: Diagnosis not present

## 2016-10-30 DIAGNOSIS — L821 Other seborrheic keratosis: Secondary | ICD-10-CM | POA: Diagnosis not present

## 2016-10-30 DIAGNOSIS — L570 Actinic keratosis: Secondary | ICD-10-CM | POA: Diagnosis not present

## 2016-10-30 DIAGNOSIS — D2261 Melanocytic nevi of right upper limb, including shoulder: Secondary | ICD-10-CM | POA: Diagnosis not present

## 2016-10-30 DIAGNOSIS — D692 Other nonthrombocytopenic purpura: Secondary | ICD-10-CM | POA: Diagnosis not present

## 2016-10-30 DIAGNOSIS — D590 Drug-induced autoimmune hemolytic anemia: Secondary | ICD-10-CM

## 2016-10-30 DIAGNOSIS — D225 Melanocytic nevi of trunk: Secondary | ICD-10-CM | POA: Diagnosis not present

## 2016-10-30 LAB — CBC WITH DIFFERENTIAL/PLATELET
BASOS ABS: 0 10*3/uL (ref 0–0.1)
BASOS PCT: 0 %
EOS ABS: 0 10*3/uL (ref 0–0.7)
EOS PCT: 0 %
HEMATOCRIT: 28.1 % — AB (ref 40.0–52.0)
Hemoglobin: 10.1 g/dL — ABNORMAL LOW (ref 13.0–18.0)
Lymphocytes Relative: 8 %
Lymphs Abs: 0.7 10*3/uL — ABNORMAL LOW (ref 1.0–3.6)
MCH: 42.3 pg — ABNORMAL HIGH (ref 26.0–34.0)
MCHC: 36 g/dL (ref 32.0–36.0)
MCV: 117.3 fL — ABNORMAL HIGH (ref 80.0–100.0)
MONO ABS: 0.4 10*3/uL (ref 0.2–1.0)
Monocytes Relative: 5 %
NEUTROS ABS: 7.7 10*3/uL — AB (ref 1.4–6.5)
Neutrophils Relative %: 87 %
PLATELETS: 285 10*3/uL (ref 150–440)
RBC: 2.4 MIL/uL — ABNORMAL LOW (ref 4.40–5.90)
RDW: 15.7 % — AB (ref 11.5–14.5)
WBC: 8.9 10*3/uL (ref 3.8–10.6)

## 2016-10-30 LAB — LACTATE DEHYDROGENASE: LDH: 408 U/L — AB (ref 98–192)

## 2016-10-30 NOTE — Telephone Encounter (Signed)
Patient's wife wanted to inform Dr. Rogue Bussing that Mr. [name] had a syncopal episode last Tuesday (not the first time this has happened).  He did not go to ER but was evaluated by PCP later that afternoon and was diagnosed with vasovagal syncope with normal EKG and blood pressure.

## 2016-11-06 ENCOUNTER — Inpatient Hospital Stay: Payer: PPO

## 2016-11-06 DIAGNOSIS — D693 Immune thrombocytopenic purpura: Secondary | ICD-10-CM

## 2016-11-06 DIAGNOSIS — D590 Drug-induced autoimmune hemolytic anemia: Secondary | ICD-10-CM

## 2016-11-06 LAB — CBC WITH DIFFERENTIAL/PLATELET
Basophils Absolute: 0 10*3/uL (ref 0–0.1)
Basophils Relative: 0 %
EOS ABS: 0 10*3/uL (ref 0–0.7)
Eosinophils Relative: 0 %
HEMATOCRIT: 28.8 % — AB (ref 40.0–52.0)
HEMOGLOBIN: 10.4 g/dL — AB (ref 13.0–18.0)
LYMPHS ABS: 0.6 10*3/uL — AB (ref 1.0–3.6)
Lymphocytes Relative: 6 %
MCH: 41.8 pg — AB (ref 26.0–34.0)
MCHC: 36 g/dL (ref 32.0–36.0)
MCV: 115.9 fL — ABNORMAL HIGH (ref 80.0–100.0)
MONO ABS: 0.3 10*3/uL (ref 0.2–1.0)
MONOS PCT: 3 %
NEUTROS ABS: 9.2 10*3/uL — AB (ref 1.4–6.5)
NEUTROS PCT: 91 %
Platelets: 127 10*3/uL — ABNORMAL LOW (ref 150–440)
RBC: 2.48 MIL/uL — ABNORMAL LOW (ref 4.40–5.90)
RDW: 15.9 % — AB (ref 11.5–14.5)
WBC: 10.1 10*3/uL (ref 3.8–10.6)

## 2016-11-06 LAB — LACTATE DEHYDROGENASE: LDH: 380 U/L — ABNORMAL HIGH (ref 98–192)

## 2016-11-06 MED ORDER — ROMIPLOSTIM 250 MCG ~~LOC~~ SOLR
80.0000 ug | Freq: Once | SUBCUTANEOUS | Status: AC
  Administered 2016-11-06: 80 ug via SUBCUTANEOUS
  Filled 2016-11-06: qty 0.16

## 2016-11-14 ENCOUNTER — Inpatient Hospital Stay: Payer: PPO

## 2016-11-14 DIAGNOSIS — D590 Drug-induced autoimmune hemolytic anemia: Secondary | ICD-10-CM

## 2016-11-14 DIAGNOSIS — D693 Immune thrombocytopenic purpura: Secondary | ICD-10-CM

## 2016-11-14 LAB — CBC WITH DIFFERENTIAL/PLATELET
BASOS ABS: 0 10*3/uL (ref 0–0.1)
BASOS PCT: 0 %
EOS ABS: 0 10*3/uL (ref 0–0.7)
EOS PCT: 0 %
HCT: 29.4 % — ABNORMAL LOW (ref 40.0–52.0)
Hemoglobin: 10.4 g/dL — ABNORMAL LOW (ref 13.0–18.0)
Lymphocytes Relative: 5 %
Lymphs Abs: 0.5 10*3/uL — ABNORMAL LOW (ref 1.0–3.6)
MCH: 39.9 pg — ABNORMAL HIGH (ref 26.0–34.0)
MCHC: 35.5 g/dL (ref 32.0–36.0)
MCV: 112.6 fL — ABNORMAL HIGH (ref 80.0–100.0)
Monocytes Absolute: 0.2 10*3/uL (ref 0.2–1.0)
Monocytes Relative: 2 %
Neutro Abs: 9.6 10*3/uL — ABNORMAL HIGH (ref 1.4–6.5)
Neutrophils Relative %: 93 %
PLATELETS: 306 10*3/uL (ref 150–440)
RBC: 2.61 MIL/uL — AB (ref 4.40–5.90)
RDW: 15.6 % — ABNORMAL HIGH (ref 11.5–14.5)
WBC: 10.4 10*3/uL (ref 3.8–10.6)

## 2016-11-14 LAB — LACTATE DEHYDROGENASE: LDH: 366 U/L — AB (ref 98–192)

## 2016-11-14 MED ORDER — ROMIPLOSTIM 250 MCG ~~LOC~~ SOLR
80.0000 ug | Freq: Once | SUBCUTANEOUS | Status: AC
  Administered 2016-11-14: 80 ug via SUBCUTANEOUS
  Filled 2016-11-14: qty 0.16

## 2016-11-21 ENCOUNTER — Inpatient Hospital Stay: Payer: PPO | Attending: Internal Medicine

## 2016-11-21 ENCOUNTER — Inpatient Hospital Stay: Payer: PPO

## 2016-11-21 ENCOUNTER — Telehealth: Payer: Self-pay

## 2016-11-21 DIAGNOSIS — Z8546 Personal history of malignant neoplasm of prostate: Secondary | ICD-10-CM | POA: Insufficient documentation

## 2016-11-21 DIAGNOSIS — E7800 Pure hypercholesterolemia, unspecified: Secondary | ICD-10-CM | POA: Insufficient documentation

## 2016-11-21 DIAGNOSIS — D589 Hereditary hemolytic anemia, unspecified: Secondary | ICD-10-CM | POA: Diagnosis not present

## 2016-11-21 DIAGNOSIS — G4700 Insomnia, unspecified: Secondary | ICD-10-CM | POA: Diagnosis not present

## 2016-11-21 DIAGNOSIS — D693 Immune thrombocytopenic purpura: Secondary | ICD-10-CM | POA: Diagnosis not present

## 2016-11-21 DIAGNOSIS — D590 Drug-induced autoimmune hemolytic anemia: Secondary | ICD-10-CM

## 2016-11-21 DIAGNOSIS — Z79899 Other long term (current) drug therapy: Secondary | ICD-10-CM | POA: Insufficient documentation

## 2016-11-21 DIAGNOSIS — I10 Essential (primary) hypertension: Secondary | ICD-10-CM | POA: Insufficient documentation

## 2016-11-21 LAB — CBC WITH DIFFERENTIAL/PLATELET
Basophils Absolute: 0 10*3/uL (ref 0–0.1)
Basophils Relative: 0 %
Eosinophils Absolute: 0 10*3/uL (ref 0–0.7)
Eosinophils Relative: 0 %
HEMATOCRIT: 30.8 % — AB (ref 40.0–52.0)
Hemoglobin: 10.8 g/dL — ABNORMAL LOW (ref 13.0–18.0)
LYMPHS ABS: 0.6 10*3/uL — AB (ref 1.0–3.6)
LYMPHS PCT: 6 %
MCH: 38.3 pg — AB (ref 26.0–34.0)
MCHC: 34.9 g/dL (ref 32.0–36.0)
MCV: 109.8 fL — AB (ref 80.0–100.0)
MONO ABS: 0.2 10*3/uL (ref 0.2–1.0)
MONOS PCT: 2 %
NEUTROS ABS: 8.8 10*3/uL — AB (ref 1.4–6.5)
Neutrophils Relative %: 92 %
Platelets: 612 10*3/uL — ABNORMAL HIGH (ref 150–440)
RBC: 2.81 MIL/uL — ABNORMAL LOW (ref 4.40–5.90)
RDW: 14.9 % — ABNORMAL HIGH (ref 11.5–14.5)
WBC: 9.7 10*3/uL (ref 3.8–10.6)

## 2016-11-21 LAB — LACTATE DEHYDROGENASE: LDH: 326 U/L — ABNORMAL HIGH (ref 98–192)

## 2016-11-21 NOTE — Telephone Encounter (Signed)
Patient here for lab/Nplate today.  His platelet result is 612 today and did not receive the Nplate inj but his wife is questioning if he needs to make any changes in Prednisone dosing at this time.

## 2016-11-21 NOTE — Telephone Encounter (Signed)
Notified pt. 

## 2016-11-21 NOTE — Telephone Encounter (Signed)
Per Dr B. Continue current dose of prednisone, will re-evaluate if any changes need to be made at next office visit.

## 2016-11-27 ENCOUNTER — Inpatient Hospital Stay: Payer: PPO

## 2016-11-27 ENCOUNTER — Inpatient Hospital Stay (HOSPITAL_BASED_OUTPATIENT_CLINIC_OR_DEPARTMENT_OTHER): Payer: PPO | Admitting: Internal Medicine

## 2016-11-27 VITALS — BP 125/65 | HR 72 | Temp 98.3°F | Wt 171.5 lb

## 2016-11-27 DIAGNOSIS — D693 Immune thrombocytopenic purpura: Secondary | ICD-10-CM

## 2016-11-27 DIAGNOSIS — Z79899 Other long term (current) drug therapy: Secondary | ICD-10-CM | POA: Diagnosis not present

## 2016-11-27 DIAGNOSIS — Z8546 Personal history of malignant neoplasm of prostate: Secondary | ICD-10-CM

## 2016-11-27 DIAGNOSIS — D589 Hereditary hemolytic anemia, unspecified: Secondary | ICD-10-CM

## 2016-11-27 DIAGNOSIS — D590 Drug-induced autoimmune hemolytic anemia: Secondary | ICD-10-CM

## 2016-11-27 LAB — CBC WITH DIFFERENTIAL/PLATELET
Basophils Absolute: 0 10*3/uL (ref 0–0.1)
Basophils Relative: 0 %
Eosinophils Absolute: 0 10*3/uL (ref 0–0.7)
Eosinophils Relative: 0 %
HEMATOCRIT: 30.2 % — AB (ref 40.0–52.0)
Hemoglobin: 10.6 g/dL — ABNORMAL LOW (ref 13.0–18.0)
LYMPHS PCT: 6 %
Lymphs Abs: 0.5 10*3/uL — ABNORMAL LOW (ref 1.0–3.6)
MCH: 37.7 pg — AB (ref 26.0–34.0)
MCHC: 35.1 g/dL (ref 32.0–36.0)
MCV: 107.5 fL — AB (ref 80.0–100.0)
MONO ABS: 0.2 10*3/uL (ref 0.2–1.0)
MONOS PCT: 2 %
NEUTROS ABS: 8.2 10*3/uL — AB (ref 1.4–6.5)
Neutrophils Relative %: 92 %
Platelets: 610 10*3/uL — ABNORMAL HIGH (ref 150–440)
RBC: 2.81 MIL/uL — ABNORMAL LOW (ref 4.40–5.90)
RDW: 15.3 % — AB (ref 11.5–14.5)
WBC: 9 10*3/uL (ref 3.8–10.6)

## 2016-11-27 LAB — LACTATE DEHYDROGENASE: LDH: 303 U/L — ABNORMAL HIGH (ref 98–192)

## 2016-11-27 NOTE — Assessment & Plan Note (Addendum)
#   ITP- steroid responsive; relapsed Currently on third line therapy with N-plate; today platelets are 612; currently HOLD N-plate.   # Hemolytic anemia-LDH improving/  Improved on Prednisone; currently on 20 mg/day; Hb 10.3/ DH improving. Taper prednisone to 10mg /day x 2 weeks; and then stop.  # Increase Cellcept 1000 mg in AM; and continue 500 mg qhs.   # Patient will have a very 2 weeks labs- CBC-LDH/ n-plate.  Follow-up with me in 6 weeks.

## 2016-11-27 NOTE — Progress Notes (Signed)
Patient here today for follow up.   

## 2016-11-27 NOTE — Progress Notes (Signed)
[facility] OFFICE PROGRESS NOTE  Patient Care Team: Derinda Late, MD as PCP - General (Family Medicine)   SUMMARY OF ONCOLOGIC HISTORY:  #FEB 2016-  ITP  s/p Prednisone; OCT 2016- Relapse of ITP- Prednisone; JAN 13th- START RITUXAN q W x4 [finished Feb 8th]; March 10th 2017- N-plate q W- good response  # Feb 2016- AUTOIMMUNE HEMOLYTIC ANEMIA; Relapsed AUG 2017- Prednisone; NOV 27th Start cellcept 500 BID.   # Hx of nose bleeds  INTERVAL HISTORY:  A very pleasant 83 year-old male patient with above history of autoimmune hemolytic anemia along with ITP-  Currently on N-plate  With good response;  And also on prednisone currently at 20 mg a day for the last 2 weeks for his hemolytic anemiais here for follow-up.  Patient had episode of syncope approximately month ago. Thought to be vasovagal.  Patient complains of intermittent dizziness especially in the morning when he wakes up. Positional. Patient currently dong well. Denies any  Unusual fatigue.  Otherwise no nausea no vomiting. No dizziness. No difficulty swallowing or pain while swallowing.    REVIEW OF SYSTEMS:  A complete 10 point review of system is done which is negative except mentioned above/history of present illness.   PAST MEDICAL HISTORY :  Past Medical History:  Diagnosis Date  . Autoimmune hemolytic anemia (HCC)   . Dehydration   . Detached retina   . Hypercholesteremia   . Hypertension   . Insomnia   . 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 Leukocytosis   . Prostate cancer ([place])   . Shingles     PAST SURGICAL HISTORY :   Past Surgical History:  Procedure Laterality Date  . artificial left eye    . EYE SURGERY    . HERNIA REPAIR    . JOINT REPLACEMENT    . PROSTATE SURGERY      FAMILY HISTORY :   Family History  Problem Relation Age of Onset  . Cancer Mother   . Multiple sclerosis Father     SOCIAL HISTORY:   Social History  Substance Use Topics  . Smoking  status: Never Smoker  . Smokeless tobacco: Not on file  . Alcohol use No    ALLERGIES:  is allergic to cephalexin and hydrocodone-acetaminophen.  MEDICATIONS:  Current Outpatient Prescriptions  Medication Sig Dispense Refill  . citalopram (CELEXA) 10 MG tablet Take 10 mg by mouth daily.    . Cyanocobalamin (RA VITAMIN B-12 TR) 1000 MCG TBCR Take 1,000 mcg by mouth daily.    . feeding supplement, ENSURE ENLIVE, (ENSURE ENLIVE) LIQD Take 237 mLs by mouth 2 (two) times daily between meals. 237 mL 12  . gabapentin (NEURONTIN) 100 MG capsule Take 100 mg by mouth at bedtime. Reported on 12/03/2015    . lisinopril (PRINIVIL,ZESTRIL) 20 MG tablet Take 20 mg by mouth every morning.     Marland Kitchen omeprazole (PRILOSEC) 40 MG capsule TAKE 1 CAPSULE BY MOUTH USUALLY 30 MINUTES BEFORE BREAKFAST 30 capsule 3  . polyethylene glycol (MIRALAX / GLYCOLAX) packet Take 17 g by mouth daily as needed for mild constipation. 14 each 0  . polyvinyl alcohol (LIQUIFILM TEARS) 1.4 % ophthalmic solution Place 2 drops into the left eye 3 (three) times daily as needed for dry eyes. 15 mL 0  . pravastatin (PRAVACHOL) 20 MG tablet Take 20 mg by mouth at bedtime.     . predniSONE (DELTASONE) 20 MG tablet     . acetaminophen (TYLENOL) 500 MG tablet Take 500  mg by mouth every 6 (six) hours as needed for mild pain. Reported on 01/24/2016    . furosemide (LASIX) 20 MG tablet Take 20 mg by mouth daily.    . mycophenolate (CELLCEPT) 500 MG tablet Take by mouth.    . potassium chloride SA (K-DUR,KLOR-CON) 20 MEQ tablet Take by mouth.     No current facility-administered medications for this visit.     PHYSICAL EXAMINATION:   BP 125/65 (BP Location: Right Arm, Patient Position: Sitting)   Pulse 72   Temp 98.3 F (36.8 C) (Tympanic)   Wt 171 lb 8 oz (77.8 kg)   BMI 27.43 kg/m   Filed Weights   11/27/16 1429  Weight: 171 lb 8 oz (77.8 kg)    GENERAL: Well-nourished well-developed; Alert, no distress and comfortable.   Accompanied by his wife. EYES: no pallor or icterus OROPHARYNX: no thrush or ulceration; good dentition no bleeding noted. NECK: supple, no masses felt LYMPH:  no palpable lymphadenopathy in the cervical, axillary or inguinal regions LUNGS: clear to auscultation and  No wheeze or crackles HEART/CVS: regular rate & rhythm and no murmurs; No lower extremity edema ABDOMEN:abdomen soft, non-tender and normal bowel sounds Musculoskeletal:no cyanosis of digits and no clubbing  PSYCH: alert & oriented x 3 with fluent speech NEURO: no focal motor/sensory deficits SKIN:  no rashes or significant lesions  LABORATORY DATA:  I have reviewed the data as listed    Component Value Date/Time   NA 133 (L) 05/29/2016 1425   NA 131 (L) 03/17/2015 1425   K 3.9 05/29/2016 1425   K 3.9 03/17/2015 1425   CL 103 05/29/2016 1425   CL 101 03/17/2015 1425   CO2 26 05/29/2016 1425   CO2 26 03/17/2015 1425   GLUCOSE 127 (H) 05/29/2016 1425   GLUCOSE 161 (H) 03/17/2015 1425   BUN 18 05/29/2016 1425   BUN 17 03/17/2015 1425   CREATININE 0.84 05/29/2016 1425   CREATININE 0.90 03/17/2015 1425   CALCIUM 8.7 (L) 05/29/2016 1425   CALCIUM 8.5 (L) 03/17/2015 1425   PROT 6.5 05/29/2016 1425   PROT 6.7 03/17/2015 1425   ALBUMIN 3.8 05/29/2016 1425   ALBUMIN 3.7 03/17/2015 1425   AST 26 05/29/2016 1425   AST 25 03/17/2015 1425   ALT 20 05/29/2016 1425   ALT 23 03/17/2015 1425   ALKPHOS 86 05/29/2016 1425   ALKPHOS 44 03/17/2015 1425   BILITOT 1.2 05/29/2016 1425   BILITOT 0.6 03/17/2015 1425   GFRNONAA >60 05/29/2016 1425   GFRNONAA >60 03/17/2015 1425   GFRAA >60 05/29/2016 1425   GFRAA >60 03/17/2015 1425    No results found for: SPEP, UPEP  Lab Results  Component Value Date   WBC 9.0 11/27/2016   NEUTROABS 8.2 (H) 11/27/2016   HGB 10.6 (L) 11/27/2016   HCT 30.2 (L) 11/27/2016   MCV 107.5 (H) 11/27/2016   PLT 610 (H) 11/27/2016      Chemistry      Component Value Date/Time   NA 133 (L)  05/29/2016 1425   NA 131 (L) 03/17/2015 1425   K 3.9 05/29/2016 1425   K 3.9 03/17/2015 1425   CL 103 05/29/2016 1425   CL 101 03/17/2015 1425   CO2 26 05/29/2016 1425   CO2 26 03/17/2015 1425   BUN 18 05/29/2016 1425   BUN 17 03/17/2015 1425   CREATININE 0.84 05/29/2016 1425   CREATININE 0.90 03/17/2015 1425      Component Value Date/Time   CALCIUM 8.7 (  L) 05/29/2016 1425   CALCIUM 8.5 (L) 03/17/2015 1425   ALKPHOS 86 05/29/2016 1425   ALKPHOS 44 03/17/2015 1425   AST 26 05/29/2016 1425   AST 25 03/17/2015 1425   ALT 20 05/29/2016 1425   ALT 23 03/17/2015 1425   BILITOT 1.2 05/29/2016 1425   BILITOT 0.6 03/17/2015 1425       ASSESSMENT & PLAN:   Idiopathic thrombocytopenic purpura (HCC) # ITP- steroid responsive; relapsed Currently on third line therapy with N-plate; today platelets are 612; currently HOLD N-plate.   # Hemolytic anemia-LDH improving/  Improved on Prednisone; currently on 20 mg/day; Hb 10.3/ DH improving. Taper prednisone to 10mg /day x 2 weeks; and then stop.  # Increase Cellcept 1000 mg in AM; and continue 500 mg qhs.   # Patient will have a very 2 weeks labs- CBC-LDH/ n-plate.  Follow-up with me in 6 weeks.      [name], MD 11/27/2016 4:49 PM

## 2016-12-01 DIAGNOSIS — R509 Fever, unspecified: Secondary | ICD-10-CM | POA: Diagnosis not present

## 2016-12-01 DIAGNOSIS — J101 Influenza due to other identified influenza virus with other respiratory manifestations: Secondary | ICD-10-CM | POA: Diagnosis not present

## 2016-12-11 ENCOUNTER — Inpatient Hospital Stay: Payer: PPO

## 2016-12-11 DIAGNOSIS — D693 Immune thrombocytopenic purpura: Secondary | ICD-10-CM | POA: Diagnosis not present

## 2016-12-11 LAB — CBC WITH DIFFERENTIAL/PLATELET
Basophils Absolute: 0 10*3/uL (ref 0–0.1)
Basophils Relative: 0 %
EOS PCT: 0 %
Eosinophils Absolute: 0 10*3/uL (ref 0–0.7)
HCT: 31.6 % — ABNORMAL LOW (ref 40.0–52.0)
Hemoglobin: 11.1 g/dL — ABNORMAL LOW (ref 13.0–18.0)
LYMPHS ABS: 0.8 10*3/uL — AB (ref 1.0–3.6)
LYMPHS PCT: 10 %
MCH: 36.3 pg — AB (ref 26.0–34.0)
MCHC: 35.1 g/dL (ref 32.0–36.0)
MCV: 103.6 fL — AB (ref 80.0–100.0)
MONO ABS: 0.5 10*3/uL (ref 0.2–1.0)
Monocytes Relative: 6 %
Neutro Abs: 6.8 10*3/uL — ABNORMAL HIGH (ref 1.4–6.5)
Neutrophils Relative %: 84 %
PLATELETS: 257 10*3/uL (ref 150–440)
RBC: 3.05 MIL/uL — ABNORMAL LOW (ref 4.40–5.90)
RDW: 16 % — AB (ref 11.5–14.5)
WBC: 8.2 10*3/uL (ref 3.8–10.6)

## 2016-12-11 LAB — LACTATE DEHYDROGENASE: LDH: 268 U/L — ABNORMAL HIGH (ref 98–192)

## 2016-12-11 NOTE — Progress Notes (Signed)
Platelets =257.  Notified MD, hold nplate today and recheck labs in 2 weeks.

## 2016-12-18 ENCOUNTER — Inpatient Hospital Stay: Payer: PPO

## 2016-12-18 ENCOUNTER — Other Ambulatory Visit: Payer: Self-pay | Admitting: *Deleted

## 2016-12-18 DIAGNOSIS — D696 Thrombocytopenia, unspecified: Secondary | ICD-10-CM

## 2016-12-18 DIAGNOSIS — D693 Immune thrombocytopenic purpura: Secondary | ICD-10-CM | POA: Diagnosis not present

## 2016-12-18 DIAGNOSIS — D590 Drug-induced autoimmune hemolytic anemia: Secondary | ICD-10-CM

## 2016-12-18 LAB — CBC WITH DIFFERENTIAL/PLATELET
BASOS ABS: 0 10*3/uL (ref 0–0.1)
Basophils Relative: 0 %
Eosinophils Absolute: 0.1 10*3/uL (ref 0–0.7)
Eosinophils Relative: 1 %
HCT: 30.7 % — ABNORMAL LOW (ref 40.0–52.0)
Hemoglobin: 10.7 g/dL — ABNORMAL LOW (ref 13.0–18.0)
LYMPHS PCT: 8 %
Lymphs Abs: 0.9 10*3/uL — ABNORMAL LOW (ref 1.0–3.6)
MCH: 36.2 pg — ABNORMAL HIGH (ref 26.0–34.0)
MCHC: 35 g/dL (ref 32.0–36.0)
MCV: 103.4 fL — AB (ref 80.0–100.0)
MONO ABS: 0.6 10*3/uL (ref 0.2–1.0)
Monocytes Relative: 5 %
Neutro Abs: 9.1 10*3/uL — ABNORMAL HIGH (ref 1.4–6.5)
Neutrophils Relative %: 86 %
PLATELETS: 318 10*3/uL (ref 150–440)
RBC: 2.97 MIL/uL — ABNORMAL LOW (ref 4.40–5.90)
RDW: 15.6 % — AB (ref 11.5–14.5)
WBC: 10.6 10*3/uL (ref 3.8–10.6)

## 2016-12-19 ENCOUNTER — Other Ambulatory Visit: Payer: Self-pay | Admitting: Internal Medicine

## 2016-12-19 DIAGNOSIS — H40041 Steroid responder, right eye: Secondary | ICD-10-CM | POA: Diagnosis not present

## 2016-12-19 DIAGNOSIS — Z961 Presence of intraocular lens: Secondary | ICD-10-CM | POA: Diagnosis not present

## 2016-12-19 DIAGNOSIS — H40051 Ocular hypertension, right eye: Secondary | ICD-10-CM | POA: Diagnosis not present

## 2016-12-25 ENCOUNTER — Inpatient Hospital Stay: Payer: PPO | Attending: Internal Medicine

## 2016-12-25 ENCOUNTER — Inpatient Hospital Stay: Payer: PPO

## 2016-12-25 DIAGNOSIS — I10 Essential (primary) hypertension: Secondary | ICD-10-CM | POA: Diagnosis not present

## 2016-12-25 DIAGNOSIS — D589 Hereditary hemolytic anemia, unspecified: Secondary | ICD-10-CM | POA: Insufficient documentation

## 2016-12-25 DIAGNOSIS — D693 Immune thrombocytopenic purpura: Secondary | ICD-10-CM | POA: Insufficient documentation

## 2016-12-25 DIAGNOSIS — Z79899 Other long term (current) drug therapy: Secondary | ICD-10-CM | POA: Insufficient documentation

## 2016-12-25 DIAGNOSIS — E7800 Pure hypercholesterolemia, unspecified: Secondary | ICD-10-CM | POA: Insufficient documentation

## 2016-12-25 DIAGNOSIS — G4700 Insomnia, unspecified: Secondary | ICD-10-CM | POA: Diagnosis not present

## 2016-12-25 DIAGNOSIS — Z8546 Personal history of malignant neoplasm of prostate: Secondary | ICD-10-CM | POA: Diagnosis not present

## 2016-12-25 LAB — CBC WITH DIFFERENTIAL/PLATELET
BASOS ABS: 0 10*3/uL (ref 0–0.1)
BASOS PCT: 0 %
EOS ABS: 0 10*3/uL (ref 0–0.7)
Eosinophils Relative: 0 %
HCT: 30.9 % — ABNORMAL LOW (ref 40.0–52.0)
HEMOGLOBIN: 11.1 g/dL — AB (ref 13.0–18.0)
Lymphocytes Relative: 11 %
Lymphs Abs: 0.9 10*3/uL — ABNORMAL LOW (ref 1.0–3.6)
MCH: 36.7 pg — ABNORMAL HIGH (ref 26.0–34.0)
MCHC: 35.8 g/dL (ref 32.0–36.0)
MCV: 102.6 fL — ABNORMAL HIGH (ref 80.0–100.0)
MONOS PCT: 5 %
Monocytes Absolute: 0.4 10*3/uL (ref 0.2–1.0)
NEUTROS PCT: 84 %
Neutro Abs: 6.4 10*3/uL (ref 1.4–6.5)
Platelets: 272 10*3/uL (ref 150–440)
RBC: 3.01 MIL/uL — ABNORMAL LOW (ref 4.40–5.90)
RDW: 15.6 % — AB (ref 11.5–14.5)
WBC: 7.7 10*3/uL (ref 3.8–10.6)

## 2016-12-25 LAB — LACTATE DEHYDROGENASE: LDH: 242 U/L — AB (ref 98–192)

## 2016-12-25 NOTE — Progress Notes (Signed)
Plt =272. Verified with MD, patient will not get nplate today.

## 2017-01-08 ENCOUNTER — Inpatient Hospital Stay: Payer: PPO

## 2017-01-08 ENCOUNTER — Inpatient Hospital Stay (HOSPITAL_BASED_OUTPATIENT_CLINIC_OR_DEPARTMENT_OTHER): Payer: PPO | Admitting: Internal Medicine

## 2017-01-08 VITALS — BP 102/58 | HR 67 | Temp 97.5°F | Resp 18 | Wt 169.2 lb

## 2017-01-08 DIAGNOSIS — D693 Immune thrombocytopenic purpura: Secondary | ICD-10-CM

## 2017-01-08 DIAGNOSIS — Z79899 Other long term (current) drug therapy: Secondary | ICD-10-CM | POA: Diagnosis not present

## 2017-01-08 DIAGNOSIS — Z8546 Personal history of malignant neoplasm of prostate: Secondary | ICD-10-CM

## 2017-01-08 DIAGNOSIS — D589 Hereditary hemolytic anemia, unspecified: Secondary | ICD-10-CM

## 2017-01-08 LAB — CBC WITH DIFFERENTIAL/PLATELET
BASOS ABS: 0 10*3/uL (ref 0–0.1)
BASOS PCT: 1 %
EOS ABS: 0 10*3/uL (ref 0–0.7)
EOS PCT: 0 %
HCT: 31.5 % — ABNORMAL LOW (ref 40.0–52.0)
Hemoglobin: 11.2 g/dL — ABNORMAL LOW (ref 13.0–18.0)
Lymphocytes Relative: 8 %
Lymphs Abs: 0.7 10*3/uL — ABNORMAL LOW (ref 1.0–3.6)
MCH: 36.6 pg — ABNORMAL HIGH (ref 26.0–34.0)
MCHC: 35.6 g/dL (ref 32.0–36.0)
MCV: 102.9 fL — ABNORMAL HIGH (ref 80.0–100.0)
MONO ABS: 0.3 10*3/uL (ref 0.2–1.0)
MONOS PCT: 4 %
NEUTROS ABS: 7.5 10*3/uL — AB (ref 1.4–6.5)
Neutrophils Relative %: 87 %
PLATELETS: 253 10*3/uL (ref 150–440)
RBC: 3.06 MIL/uL — ABNORMAL LOW (ref 4.40–5.90)
RDW: 14.9 % — AB (ref 11.5–14.5)
WBC: 8.6 10*3/uL (ref 3.8–10.6)

## 2017-01-08 LAB — LACTATE DEHYDROGENASE: LDH: 266 U/L — AB (ref 98–192)

## 2017-01-08 NOTE — Assessment & Plan Note (Signed)
#   ITP- steroid responsive; relapsed Currently on third line therapy with N-plate; today platelets are 253 currently HOLD N-plate.   # Hemolytic anemia-LDH improving/  Improved on Prednisone; currently on 10 mg/day; Hb 11.33/LDH- 266-STABLE.  Taper prednisone to 10mg Willey Blade other day x 2 weeks; and then stop.  # Continue Cellcept  At 1000 mg in AM; and continue 500 mg qhs.   # Patient will have a very 2 weeks labs- CBC-LDH/ n-plate.  Follow-up with me in 6 weeks.

## 2017-01-08 NOTE — Progress Notes (Signed)
[facility] OFFICE PROGRESS NOTE  Patient Care Team: Derinda Late, MD as PCP - General (Family Medicine)   SUMMARY OF ONCOLOGIC HISTORY:  #FEB 2016-  ITP  s/p Prednisone; OCT 2016- Relapse of ITP- Prednisone; JAN 13th- START RITUXAN q W x4 [finished Feb 8th]; March 10th 2017- N-plate q W- good response  # Feb 2016- AUTOIMMUNE HEMOLYTIC ANEMIA; Relapsed AUG 2017- Prednisone; NOV 27th Start cellcept 500 BID.   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 Patient has not had N- plate for 4 weeks now.He is also on prednisone currently at 10 mg a day; and cellcept  his hemolytic anemiai here for follow-up.  Patient currently dong well. Denies any  Unusual fatigue.  Otherwise no nausea no vomiting. No dizziness. No difficulty swallowing or pain while swallowing.    REVIEW OF SYSTEMS:  A complete 10 point review of system is done which is negative except mentioned above/history of present illness.   PAST MEDICAL HISTORY :  Past Medical History:  Diagnosis Date  . Autoimmune hemolytic anemia (HCC)   . Dehydration   . Detached retina   . Hypercholesteremia   . Hypertension   . Insomnia   . 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 Leukocytosis   . Prostate cancer ([place])   . Shingles     PAST SURGICAL HISTORY :   Past Surgical History:  Procedure Laterality Date  . artificial left eye    . EYE SURGERY    . HERNIA REPAIR    . JOINT REPLACEMENT    . PROSTATE SURGERY      FAMILY HISTORY :   Family History  Problem Relation Age of Onset  . Cancer Mother   . Multiple sclerosis Father     SOCIAL HISTORY:   Social History  Substance Use Topics  . Smoking status: Never Smoker  . Smokeless tobacco: Not on file  . Alcohol use No    ALLERGIES:  is allergic to cephalexin and  hydrocodone-acetaminophen.  MEDICATIONS:  Current Outpatient Prescriptions  Medication Sig Dispense Refill  . acetaminophen (TYLENOL) 500 MG tablet Take 500 mg by mouth every 6 (six) hours as needed for mild pain. Reported on 01/24/2016    . citalopram (CELEXA) 10 MG tablet Take 10 mg by mouth daily.    . Cyanocobalamin (RA VITAMIN B-12 TR) 1000 MCG TBCR Take 1,000 mcg by mouth daily.    . feeding supplement, ENSURE ENLIVE, (ENSURE ENLIVE) LIQD Take 237 mLs by mouth 2 (two) times daily between meals. 237 mL 12  . gabapentin (NEURONTIN) 100 MG capsule Take 100 mg by mouth at bedtime. Reported on 12/03/2015    . lisinopril (PRINIVIL,ZESTRIL) 20 MG tablet Take 20 mg by mouth every morning.     . mycophenolate (CELLCEPT) 500 MG tablet Take 500 mg by mouth 3 (three) times daily.     Marland Kitchen omeprazole (PRILOSEC) 40 MG capsule TAKE 1 CAPSULE BY MOUTH USUALLY 30 MINUTES BEFORE BREAKFAST 90 capsule 2  . polyethylene glycol (MIRALAX / GLYCOLAX) packet Take 17 g by mouth daily as needed for mild constipation. 14 each 0  . polyvinyl alcohol (LIQUIFILM TEARS) 1.4 % ophthalmic solution Place 2 drops into the left eye 3 (three) times daily as needed for dry eyes. 15 mL 0  . pravastatin (PRAVACHOL) 20 MG tablet Take 20 mg by mouth  at bedtime.     . predniSONE (DELTASONE) 20 MG tablet Take 10 mg by mouth daily.     . furosemide (LASIX) 20 MG tablet Take 20 mg by mouth daily.    . potassium chloride SA (K-DUR,KLOR-CON) 20 MEQ tablet Take by mouth.     No current facility-administered medications for this visit.     PHYSICAL EXAMINATION:   BP (!) 102/58 (BP Location: Right Arm, Patient Position: Sitting)   Pulse 67   Temp 97.5 F (36.4 C) (Tympanic)   Resp 18   Wt 169 lb 4 oz (76.8 kg)   BMI 27.07 kg/m   Filed Weights   01/08/17 1437  Weight: 169 lb 4 oz (76.8 kg)    GENERAL: Well-nourished well-developed; Alert, no distress and comfortable.  Accompanied by his wife. EYES: no pallor or  icterus OROPHARYNX: no thrush or ulceration; good dentition no bleeding noted. NECK: supple, no masses felt LYMPH:  no palpable lymphadenopathy in the cervical, axillary or inguinal regions LUNGS: clear to auscultation and  No wheeze or crackles HEART/CVS: regular rate & rhythm and no murmurs; No lower extremity edema ABDOMEN:abdomen soft, non-tender and normal bowel sounds Musculoskeletal:no cyanosis of digits and no clubbing  PSYCH: alert & oriented x 3 with fluent speech NEURO: no focal motor/sensory deficits SKIN:  no rashes or significant lesions  LABORATORY DATA:  I have reviewed the data as listed    Component Value Date/Time   NA 133 (L) 05/29/2016 1425   NA 131 (L) 03/17/2015 1425   K 3.9 05/29/2016 1425   K 3.9 03/17/2015 1425   CL 103 05/29/2016 1425   CL 101 03/17/2015 1425   CO2 26 05/29/2016 1425   CO2 26 03/17/2015 1425   GLUCOSE 127 (H) 05/29/2016 1425   GLUCOSE 161 (H) 03/17/2015 1425   BUN 18 05/29/2016 1425   BUN 17 03/17/2015 1425   CREATININE 0.84 05/29/2016 1425   CREATININE 0.90 03/17/2015 1425   CALCIUM 8.7 (L) 05/29/2016 1425   CALCIUM 8.5 (L) 03/17/2015 1425   PROT 6.5 05/29/2016 1425   PROT 6.7 03/17/2015 1425   ALBUMIN 3.8 05/29/2016 1425   ALBUMIN 3.7 03/17/2015 1425   AST 26 05/29/2016 1425   AST 25 03/17/2015 1425   ALT 20 05/29/2016 1425   ALT 23 03/17/2015 1425   ALKPHOS 86 05/29/2016 1425   ALKPHOS 44 03/17/2015 1425   BILITOT 1.2 05/29/2016 1425   BILITOT 0.6 03/17/2015 1425   GFRNONAA >60 05/29/2016 1425   GFRNONAA >60 03/17/2015 1425   GFRAA >60 05/29/2016 1425   GFRAA >60 03/17/2015 1425    No results found for: SPEP, UPEP  Lab Results  Component Value Date   WBC 8.6 01/08/2017   NEUTROABS 7.5 (H) 01/08/2017   HGB 11.2 (L) 01/08/2017   HCT 31.5 (L) 01/08/2017   MCV 102.9 (H) 01/08/2017   PLT 253 01/08/2017      Chemistry      Component Value Date/Time   NA 133 (L) 05/29/2016 1425   NA 131 (L) 03/17/2015 1425   K  3.9 05/29/2016 1425   K 3.9 03/17/2015 1425   CL 103 05/29/2016 1425   CL 101 03/17/2015 1425   CO2 26 05/29/2016 1425   CO2 26 03/17/2015 1425   BUN 18 05/29/2016 1425   BUN 17 03/17/2015 1425   CREATININE 0.84 05/29/2016 1425   CREATININE 0.90 03/17/2015 1425      Component Value Date/Time   CALCIUM 8.7 (L) 05/29/2016 1425  CALCIUM 8.5 (L) 03/17/2015 1425   ALKPHOS 86 05/29/2016 1425   ALKPHOS 44 03/17/2015 1425   AST 26 05/29/2016 1425   AST 25 03/17/2015 1425   ALT 20 05/29/2016 1425   ALT 23 03/17/2015 1425   BILITOT 1.2 05/29/2016 1425   BILITOT 0.6 03/17/2015 1425       ASSESSMENT & PLAN:   Idiopathic thrombocytopenic purpura (HCC) # ITP- steroid responsive; relapsed Currently on third line therapy with N-plate; today platelets are 253 currently HOLD N-plate.   # Hemolytic anemia-LDH improving/  Improved on Prednisone; currently on 10 mg/day; Hb 11.33/LDH- 266-STABLE.  Taper prednisone to 10mg Willey Blade other day x 2 weeks; and then stop.  # Continue Cellcept  At 1000 mg in AM; and continue 500 mg qhs.   # Patient will have a very 2 weeks labs- CBC-LDH/ n-plate.  Follow-up with me in 6 weeks.      [name], MD 01/08/2017 4:00 PM

## 2017-01-08 NOTE — Progress Notes (Signed)
Patient here today for follow up.  Patient states no new concerns today  

## 2017-01-12 DIAGNOSIS — E7800 Pure hypercholesterolemia, unspecified: Secondary | ICD-10-CM | POA: Diagnosis not present

## 2017-01-12 DIAGNOSIS — R55 Syncope and collapse: Secondary | ICD-10-CM | POA: Diagnosis not present

## 2017-01-12 DIAGNOSIS — I10 Essential (primary) hypertension: Secondary | ICD-10-CM | POA: Diagnosis not present

## 2017-01-22 ENCOUNTER — Inpatient Hospital Stay: Payer: PPO

## 2017-01-22 ENCOUNTER — Inpatient Hospital Stay: Payer: PPO | Attending: Internal Medicine

## 2017-01-22 DIAGNOSIS — D589 Hereditary hemolytic anemia, unspecified: Secondary | ICD-10-CM | POA: Insufficient documentation

## 2017-01-22 DIAGNOSIS — G4700 Insomnia, unspecified: Secondary | ICD-10-CM | POA: Diagnosis not present

## 2017-01-22 DIAGNOSIS — D693 Immune thrombocytopenic purpura: Secondary | ICD-10-CM

## 2017-01-22 DIAGNOSIS — I10 Essential (primary) hypertension: Secondary | ICD-10-CM | POA: Insufficient documentation

## 2017-01-22 DIAGNOSIS — E7800 Pure hypercholesterolemia, unspecified: Secondary | ICD-10-CM | POA: Diagnosis not present

## 2017-01-22 DIAGNOSIS — Z79899 Other long term (current) drug therapy: Secondary | ICD-10-CM | POA: Insufficient documentation

## 2017-01-22 LAB — CBC WITH DIFFERENTIAL/PLATELET
Basophils Absolute: 0 10*3/uL (ref 0–0.1)
Basophils Relative: 1 %
EOS ABS: 0 10*3/uL (ref 0–0.7)
EOS PCT: 0 %
HCT: 30.5 % — ABNORMAL LOW (ref 40.0–52.0)
Hemoglobin: 11 g/dL — ABNORMAL LOW (ref 13.0–18.0)
LYMPHS ABS: 0.6 10*3/uL — AB (ref 1.0–3.6)
LYMPHS PCT: 7 %
MCH: 36.6 pg — AB (ref 26.0–34.0)
MCHC: 36.1 g/dL — AB (ref 32.0–36.0)
MCV: 101.5 fL — AB (ref 80.0–100.0)
MONO ABS: 0.3 10*3/uL (ref 0.2–1.0)
MONOS PCT: 3 %
Neutro Abs: 7.9 10*3/uL — ABNORMAL HIGH (ref 1.4–6.5)
Neutrophils Relative %: 89 %
PLATELETS: 256 10*3/uL (ref 150–440)
RBC: 3 MIL/uL — AB (ref 4.40–5.90)
RDW: 14.9 % — ABNORMAL HIGH (ref 11.5–14.5)
WBC: 8.9 10*3/uL (ref 3.8–10.6)

## 2017-01-22 LAB — LACTATE DEHYDROGENASE: LDH: 286 U/L — ABNORMAL HIGH (ref 98–192)

## 2017-02-05 ENCOUNTER — Inpatient Hospital Stay: Payer: PPO

## 2017-02-05 DIAGNOSIS — D693 Immune thrombocytopenic purpura: Secondary | ICD-10-CM | POA: Diagnosis not present

## 2017-02-05 LAB — CBC WITH DIFFERENTIAL/PLATELET
Basophils Absolute: 0 10*3/uL (ref 0–0.1)
Basophils Relative: 0 %
Eosinophils Absolute: 0 10*3/uL (ref 0–0.7)
Eosinophils Relative: 0 %
HCT: 31.9 % — ABNORMAL LOW (ref 40.0–52.0)
Hemoglobin: 11.4 g/dL — ABNORMAL LOW (ref 13.0–18.0)
Lymphocytes Relative: 6 %
Lymphs Abs: 0.5 10*3/uL — ABNORMAL LOW (ref 1.0–3.6)
MCH: 36 pg — ABNORMAL HIGH (ref 26.0–34.0)
MCHC: 35.7 g/dL (ref 32.0–36.0)
MCV: 100.9 fL — ABNORMAL HIGH (ref 80.0–100.0)
Monocytes Absolute: 0.2 10*3/uL (ref 0.2–1.0)
Monocytes Relative: 2 %
Neutro Abs: 7.9 10*3/uL — ABNORMAL HIGH (ref 1.4–6.5)
Neutrophils Relative %: 92 %
Platelets: 328 10*3/uL (ref 150–440)
RBC: 3.16 MIL/uL — ABNORMAL LOW (ref 4.40–5.90)
RDW: 14 % (ref 11.5–14.5)
WBC: 8.6 10*3/uL (ref 3.8–10.6)

## 2017-02-05 LAB — LACTATE DEHYDROGENASE: LDH: 283 U/L — ABNORMAL HIGH (ref 98–192)

## 2017-02-09 DIAGNOSIS — R040 Epistaxis: Secondary | ICD-10-CM | POA: Diagnosis not present

## 2017-02-09 DIAGNOSIS — J340 Abscess, furuncle and carbuncle of nose: Secondary | ICD-10-CM | POA: Diagnosis not present

## 2017-02-19 ENCOUNTER — Inpatient Hospital Stay: Payer: PPO | Attending: Internal Medicine | Admitting: Internal Medicine

## 2017-02-19 ENCOUNTER — Inpatient Hospital Stay: Payer: PPO

## 2017-02-19 VITALS — BP 121/73 | HR 74 | Temp 97.6°F | Wt 167.6 lb

## 2017-02-19 DIAGNOSIS — D591 Other autoimmune hemolytic anemias: Secondary | ICD-10-CM | POA: Diagnosis not present

## 2017-02-19 DIAGNOSIS — I10 Essential (primary) hypertension: Secondary | ICD-10-CM | POA: Insufficient documentation

## 2017-02-19 DIAGNOSIS — E7800 Pure hypercholesterolemia, unspecified: Secondary | ICD-10-CM | POA: Diagnosis not present

## 2017-02-19 DIAGNOSIS — D693 Immune thrombocytopenic purpura: Secondary | ICD-10-CM

## 2017-02-19 DIAGNOSIS — Z79899 Other long term (current) drug therapy: Secondary | ICD-10-CM | POA: Diagnosis not present

## 2017-02-19 DIAGNOSIS — Z8546 Personal history of malignant neoplasm of prostate: Secondary | ICD-10-CM | POA: Diagnosis not present

## 2017-02-19 LAB — COMPREHENSIVE METABOLIC PANEL
ALBUMIN: 3.8 g/dL (ref 3.5–5.0)
ALT: 18 U/L (ref 17–63)
ANION GAP: 7 (ref 5–15)
AST: 26 U/L (ref 15–41)
Alkaline Phosphatase: 66 U/L (ref 38–126)
BILIRUBIN TOTAL: 1.1 mg/dL (ref 0.3–1.2)
BUN: 14 mg/dL (ref 6–20)
CO2: 24 mmol/L (ref 22–32)
Calcium: 8.8 mg/dL — ABNORMAL LOW (ref 8.9–10.3)
Chloride: 103 mmol/L (ref 101–111)
Creatinine, Ser: 0.78 mg/dL (ref 0.61–1.24)
GFR calc Af Amer: >60 mL/min (ref >60)
GLUCOSE: 114 mg/dL — AB (ref 65–99)
POTASSIUM: 4.3 mmol/L (ref 3.5–5.1)
Sodium: 134 mmol/L — ABNORMAL LOW (ref 135–145)
TOTAL PROTEIN: 7.1 g/dL (ref 6.5–8.1)

## 2017-02-19 LAB — CBC WITH DIFFERENTIAL/PLATELET
BASOS ABS: 0 10*3/uL (ref 0–0.1)
Basophils Relative: 0 %
EOS PCT: 0 %
Eosinophils Absolute: 0 10*3/uL (ref 0–0.7)
HCT: 32.2 % — ABNORMAL LOW (ref 40.0–52.0)
Hemoglobin: 11.5 g/dL — ABNORMAL LOW (ref 13.0–18.0)
LYMPHS PCT: 9 %
Lymphs Abs: 0.8 10*3/uL — ABNORMAL LOW (ref 1.0–3.6)
MCH: 36.3 pg — ABNORMAL HIGH (ref 26.0–34.0)
MCHC: 35.8 g/dL (ref 32.0–36.0)
MCV: 101.4 fL — AB (ref 80.0–100.0)
MONO ABS: 0.2 10*3/uL (ref 0.2–1.0)
Monocytes Relative: 3 %
Neutro Abs: 7.5 10*3/uL — ABNORMAL HIGH (ref 1.4–6.5)
Neutrophils Relative %: 88 %
PLATELETS: 284 10*3/uL (ref 150–440)
RBC: 3.18 MIL/uL — ABNORMAL LOW (ref 4.40–5.90)
RDW: 14.4 % (ref 11.5–14.5)
WBC: 8.6 10*3/uL (ref 3.8–10.6)

## 2017-02-19 LAB — LACTATE DEHYDROGENASE: LDH: 276 U/L — ABNORMAL HIGH (ref 98–192)

## 2017-02-19 NOTE — Assessment & Plan Note (Addendum)
#   ITP- steroid responsive; relapsed Currently on third line therapy with N-plate; today platelets are 253 currently HOLD N-plate [last dec 26th 2017].   # Hemolytic anemia-LDH improving/  Improved on Prednisone; currently on 10 mg/ever other day; Hb 11.5/LDH- 270s-STABLE/improving. Continue Cellcept  At 1000 mg in AM; and continue 500 mg qhs.   # Patient will have a every 2 weeks labs- CBC-LDH/ n-plate.  Follow-up with me in 8 weeks.

## 2017-02-19 NOTE — Progress Notes (Signed)
Patient here for follow up. Patient has a nose bleed approximately one week ago. He required cauterization.

## 2017-02-19 NOTE — Progress Notes (Signed)
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 Patient has not had N- plate for since dec 26th 2017.   He is also on prednisone currently at 10 mg a day; and cellcept  his hemolytic anemiai here for follow-up. Patient currently dong well. Denies any  Unusual fatigue.  Otherwise no nausea no vomiting. No dizziness. No difficulty swallowing or pain while swallowing.    REVIEW OF SYSTEMS:  A complete 10 point review of system is done which is negative except mentioned above/history of present illness.   PAST MEDICAL HISTORY :  Past Medical History:  Diagnosis Date  . Autoimmune hemolytic anemia (HCC)   . Dehydration   . Detached retina   . Hypercholesteremia   . Hypertension   . Insomnia   . 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 Leukocytosis   . Prostate cancer ([place])   . Shingles     PAST SURGICAL HISTORY :   Past Surgical History:  Procedure Laterality Date  . artificial left eye    . EYE SURGERY    . HERNIA REPAIR    . JOINT REPLACEMENT    . PROSTATE SURGERY      FAMILY HISTORY :   Family History  Problem Relation Age of Onset  . Cancer Mother   . Multiple sclerosis Father     SOCIAL HISTORY:   Social History  Substance Use Topics  . Smoking status: Never Smoker  . Smokeless tobacco: Not on file  . Alcohol use No    ALLERGIES:  is allergic to cephalexin and  hydrocodone-acetaminophen.  MEDICATIONS:  Current Outpatient Prescriptions  Medication Sig Dispense Refill  . acetaminophen (TYLENOL) 500 MG tablet Take 500 mg by mouth every 6 (six) hours as needed for mild pain. Reported on 01/24/2016    . citalopram (CELEXA) 10 MG tablet Take 10 mg by mouth daily.    . Cyanocobalamin (RA VITAMIN B-12 TR) 1000 MCG TBCR Take 1,000 mcg by mouth daily.    . feeding supplement, ENSURE ENLIVE, (ENSURE ENLIVE) LIQD Take 237 mLs by mouth 2 (two) times daily between meals. 237 mL 12  . gabapentin (NEURONTIN) 100 MG capsule Take 100 mg by mouth at bedtime. Reported on 12/03/2015    . lisinopril (PRINIVIL,ZESTRIL) 10 MG tablet Take by mouth.    . mycophenolate (CELLCEPT) 500 MG tablet Take 500 mg by mouth 3 (three) times daily.     Marland Kitchen omeprazole (PRILOSEC) 40 MG capsule TAKE 1 CAPSULE BY MOUTH USUALLY 30 MINUTES BEFORE BREAKFAST 90 capsule 2  . polyethylene glycol (MIRALAX / GLYCOLAX) packet Take 17 g by mouth daily as needed for mild constipation. 14 each 0  . polyvinyl alcohol (LIQUIFILM TEARS) 1.4 % ophthalmic solution Place 2 drops into the left eye 3 (three) times daily as needed for dry eyes. 15 mL 0  . pravastatin (PRAVACHOL) 20 MG tablet Take 20 mg by mouth at bedtime.     Marland Kitchen  predniSONE (DELTASONE) 20 MG tablet Take 10 mg by mouth every other day.     . furosemide (LASIX) 20 MG tablet Take 20 mg by mouth daily.    . potassium chloride SA (K-DUR,KLOR-CON) 20 MEQ tablet Take by mouth.     No current facility-administered medications for this visit.     PHYSICAL EXAMINATION:   BP 121/73 (BP Location: Left Arm, Patient Position: Sitting)   Pulse 74   Temp 97.6 F (36.4 C) (Tympanic)   Wt 167 lb 9.6 oz (76 kg)   BMI 26.81 kg/m   Filed Weights   02/19/17 1431  Weight: 167 lb 9.6 oz (76 kg)    GENERAL: Well-nourished well-developed; Alert, no distress and comfortable.  Accompanied by his wife. EYES: no pallor or icterus OROPHARYNX: no thrush or  ulceration; good dentition no bleeding noted. NECK: supple, no masses felt LYMPH:  no palpable lymphadenopathy in the cervical, axillary or inguinal regions LUNGS: clear to auscultation and  No wheeze or crackles HEART/CVS: regular rate & rhythm and no murmurs; No lower extremity edema ABDOMEN:abdomen soft, non-tender and normal bowel sounds Musculoskeletal:no cyanosis of digits and no clubbing  PSYCH: alert & oriented x 3 with fluent speech NEURO: no focal motor/sensory deficits SKIN:  no rashes or significant lesions  LABORATORY DATA:  I have reviewed the data as listed    Component Value Date/Time   NA 134 (L) 02/19/2017 1354   NA 131 (L) 03/17/2015 1425   K 4.3 02/19/2017 1354   K 3.9 03/17/2015 1425   CL 103 02/19/2017 1354   CL 101 03/17/2015 1425   CO2 24 02/19/2017 1354   CO2 26 03/17/2015 1425   GLUCOSE 114 (H) 02/19/2017 1354   GLUCOSE 161 (H) 03/17/2015 1425   BUN 14 02/19/2017 1354   BUN 17 03/17/2015 1425   CREATININE 0.78 02/19/2017 1354   CREATININE 0.90 03/17/2015 1425   CALCIUM 8.8 (L) 02/19/2017 1354   CALCIUM 8.5 (L) 03/17/2015 1425   PROT 7.1 02/19/2017 1354   PROT 6.7 03/17/2015 1425   ALBUMIN 3.8 02/19/2017 1354   ALBUMIN 3.7 03/17/2015 1425   AST 26 02/19/2017 1354   AST 25 03/17/2015 1425   ALT 18 02/19/2017 1354   ALT 23 03/17/2015 1425   ALKPHOS 66 02/19/2017 1354   ALKPHOS 44 03/17/2015 1425   BILITOT 1.1 02/19/2017 1354   BILITOT 0.6 03/17/2015 1425   GFRNONAA >60 02/19/2017 1354   GFRNONAA >60 03/17/2015 1425   GFRAA >60 02/19/2017 1354   GFRAA >60 03/17/2015 1425    No results found for: SPEP, UPEP  Lab Results  Component Value Date   WBC 8.6 02/19/2017   NEUTROABS 7.5 (H) 02/19/2017   HGB 11.5 (L) 02/19/2017   HCT 32.2 (L) 02/19/2017   MCV 101.4 (H) 02/19/2017   PLT 284 02/19/2017      Chemistry      Component Value Date/Time   NA 134 (L) 02/19/2017 1354   NA 131 (L) 03/17/2015 1425   K 4.3 02/19/2017 1354   K 3.9  03/17/2015 1425   CL 103 02/19/2017 1354   CL 101 03/17/2015 1425   CO2 24 02/19/2017 1354   CO2 26 03/17/2015 1425   BUN 14 02/19/2017 1354   BUN 17 03/17/2015 1425   CREATININE 0.78 02/19/2017 1354   CREATININE 0.90 03/17/2015 1425      Component Value Date/Time   CALCIUM 8.8 (L) 02/19/2017 1354   CALCIUM 8.5 (L) 03/17/2015 1425   ALKPHOS 66  02/19/2017 1354   ALKPHOS 44 03/17/2015 1425   AST 26 02/19/2017 1354   AST 25 03/17/2015 1425   ALT 18 02/19/2017 1354   ALT 23 03/17/2015 1425   BILITOT 1.1 02/19/2017 1354   BILITOT 0.6 03/17/2015 1425       ASSESSMENT & PLAN:   Idiopathic thrombocytopenic purpura ([place]) # ITP- steroid responsive; relapsed Currently on third line therapy with N-plate; today platelets are 253 currently HOLD N-plate [last dec 26th 2017].   # Hemolytic anemia-LDH improving/  Improved on Prednisone; currently on 10 mg/ever other day; Hb 11.5/LDH- 270s-STABLE/improving. Continue Cellcept  At 1000 mg in AM; and continue 500 mg qhs.   # Patient will have a every 2 weeks labs- CBC-LDH/ n-plate.  Follow-up with me in 8 weeks.      [name], MD 02/20/2017 1:05 PM

## 2017-03-05 ENCOUNTER — Inpatient Hospital Stay: Payer: PPO

## 2017-03-05 DIAGNOSIS — D693 Immune thrombocytopenic purpura: Secondary | ICD-10-CM

## 2017-03-05 LAB — CBC WITH DIFFERENTIAL/PLATELET
Basophils Absolute: 0 10*3/uL (ref 0–0.1)
Basophils Relative: 1 %
EOS PCT: 3 %
Eosinophils Absolute: 0.2 10*3/uL (ref 0–0.7)
HEMATOCRIT: 30 % — AB (ref 40.0–52.0)
HEMOGLOBIN: 10.8 g/dL — AB (ref 13.0–18.0)
Lymphocytes Relative: 20 %
Lymphs Abs: 1.5 10*3/uL (ref 1.0–3.6)
MCH: 36.4 pg — AB (ref 26.0–34.0)
MCHC: 35.9 g/dL (ref 32.0–36.0)
MCV: 101.2 fL — AB (ref 80.0–100.0)
Monocytes Absolute: 0.8 10*3/uL (ref 0.2–1.0)
Monocytes Relative: 10 %
NEUTROS PCT: 66 %
Neutro Abs: 5.1 10*3/uL (ref 1.4–6.5)
Platelets: 239 10*3/uL (ref 150–440)
RBC: 2.97 MIL/uL — AB (ref 4.40–5.90)
RDW: 14.1 % (ref 11.5–14.5)
WBC: 7.7 10*3/uL (ref 3.8–10.6)

## 2017-03-05 LAB — LACTATE DEHYDROGENASE: LDH: 294 U/L — ABNORMAL HIGH (ref 98–192)

## 2017-03-06 ENCOUNTER — Other Ambulatory Visit: Payer: Self-pay | Admitting: *Deleted

## 2017-03-06 DIAGNOSIS — D693 Immune thrombocytopenic purpura: Secondary | ICD-10-CM

## 2017-03-06 MED ORDER — MYCOPHENOLATE MOFETIL 500 MG PO TABS: ORAL_TABLET | ORAL | 1 refills | Status: DC

## 2017-03-19 ENCOUNTER — Inpatient Hospital Stay: Payer: PPO

## 2017-03-19 DIAGNOSIS — D693 Immune thrombocytopenic purpura: Secondary | ICD-10-CM | POA: Diagnosis not present

## 2017-03-19 LAB — CBC WITH DIFFERENTIAL/PLATELET
Basophils Absolute: 0.1 10*3/uL (ref 0–0.1)
Basophils Relative: 1 %
EOS ABS: 0.2 10*3/uL (ref 0–0.7)
EOS PCT: 3 %
HCT: 30.9 % — ABNORMAL LOW (ref 40.0–52.0)
Hemoglobin: 11.2 g/dL — ABNORMAL LOW (ref 13.0–18.0)
LYMPHS ABS: 1.2 10*3/uL (ref 1.0–3.6)
Lymphocytes Relative: 16 %
MCH: 36.4 pg — AB (ref 26.0–34.0)
MCHC: 36.2 g/dL — AB (ref 32.0–36.0)
MCV: 100.6 fL — ABNORMAL HIGH (ref 80.0–100.0)
MONO ABS: 0.8 10*3/uL (ref 0.2–1.0)
MONOS PCT: 10 %
Neutro Abs: 5.7 10*3/uL (ref 1.4–6.5)
Neutrophils Relative %: 72 %
PLATELETS: 228 10*3/uL (ref 150–440)
RBC: 3.07 MIL/uL — ABNORMAL LOW (ref 4.40–5.90)
RDW: 13.9 % (ref 11.5–14.5)
WBC: 7.9 10*3/uL (ref 3.8–10.6)

## 2017-03-19 LAB — LACTATE DEHYDROGENASE: LDH: 293 U/L — ABNORMAL HIGH (ref 98–192)

## 2017-03-22 DIAGNOSIS — Z125 Encounter for screening for malignant neoplasm of prostate: Secondary | ICD-10-CM | POA: Diagnosis not present

## 2017-03-22 DIAGNOSIS — Z79899 Other long term (current) drug therapy: Secondary | ICD-10-CM | POA: Diagnosis not present

## 2017-03-22 DIAGNOSIS — E7800 Pure hypercholesterolemia, unspecified: Secondary | ICD-10-CM | POA: Diagnosis not present

## 2017-03-29 DIAGNOSIS — D693 Immune thrombocytopenic purpura: Secondary | ICD-10-CM | POA: Diagnosis not present

## 2017-03-29 DIAGNOSIS — K219 Gastro-esophageal reflux disease without esophagitis: Secondary | ICD-10-CM | POA: Diagnosis not present

## 2017-03-29 DIAGNOSIS — Z1231 Encounter for screening mammogram for malignant neoplasm of breast: Secondary | ICD-10-CM | POA: Diagnosis not present

## 2017-03-29 DIAGNOSIS — I10 Essential (primary) hypertension: Secondary | ICD-10-CM | POA: Diagnosis not present

## 2017-03-29 DIAGNOSIS — E7800 Pure hypercholesterolemia, unspecified: Secondary | ICD-10-CM | POA: Diagnosis not present

## 2017-04-02 ENCOUNTER — Ambulatory Visit: Payer: PPO

## 2017-04-02 ENCOUNTER — Other Ambulatory Visit: Payer: PPO

## 2017-04-04 ENCOUNTER — Inpatient Hospital Stay: Payer: PPO

## 2017-04-04 ENCOUNTER — Inpatient Hospital Stay: Payer: PPO | Attending: Internal Medicine

## 2017-04-04 DIAGNOSIS — I10 Essential (primary) hypertension: Secondary | ICD-10-CM | POA: Diagnosis not present

## 2017-04-04 DIAGNOSIS — D589 Hereditary hemolytic anemia, unspecified: Secondary | ICD-10-CM | POA: Insufficient documentation

## 2017-04-04 DIAGNOSIS — E7800 Pure hypercholesterolemia, unspecified: Secondary | ICD-10-CM | POA: Diagnosis not present

## 2017-04-04 DIAGNOSIS — Z8546 Personal history of malignant neoplasm of prostate: Secondary | ICD-10-CM | POA: Diagnosis not present

## 2017-04-04 DIAGNOSIS — D693 Immune thrombocytopenic purpura: Secondary | ICD-10-CM | POA: Diagnosis not present

## 2017-04-04 DIAGNOSIS — G4700 Insomnia, unspecified: Secondary | ICD-10-CM | POA: Insufficient documentation

## 2017-04-04 DIAGNOSIS — Z79899 Other long term (current) drug therapy: Secondary | ICD-10-CM | POA: Insufficient documentation

## 2017-04-04 LAB — CBC WITH DIFFERENTIAL/PLATELET
BASOS ABS: 0 10*3/uL (ref 0–0.1)
Basophils Relative: 1 %
Eosinophils Absolute: 0.2 10*3/uL (ref 0–0.7)
Eosinophils Relative: 2 %
HEMATOCRIT: 30.9 % — AB (ref 40.0–52.0)
Hemoglobin: 11 g/dL — ABNORMAL LOW (ref 13.0–18.0)
LYMPHS PCT: 15 %
Lymphs Abs: 1.2 10*3/uL (ref 1.0–3.6)
MCH: 36.2 pg — ABNORMAL HIGH (ref 26.0–34.0)
MCHC: 35.6 g/dL (ref 32.0–36.0)
MCV: 101.6 fL — AB (ref 80.0–100.0)
MONOS PCT: 9 %
Monocytes Absolute: 0.8 10*3/uL (ref 0.2–1.0)
NEUTROS ABS: 5.9 10*3/uL (ref 1.4–6.5)
Neutrophils Relative %: 73 %
Platelets: 148 10*3/uL — ABNORMAL LOW (ref 150–440)
RBC: 3.04 MIL/uL — ABNORMAL LOW (ref 4.40–5.90)
RDW: 14.1 % (ref 11.5–14.5)
WBC: 8.1 10*3/uL (ref 3.8–10.6)

## 2017-04-04 LAB — LACTATE DEHYDROGENASE: LDH: 297 U/L — ABNORMAL HIGH (ref 98–192)

## 2017-04-04 MED ORDER — ROMIPLOSTIM 250 MCG ~~LOC~~ SOLR
75.0000 ug | Freq: Once | SUBCUTANEOUS | Status: AC
  Administered 2017-04-04: 75 ug via SUBCUTANEOUS
  Filled 2017-04-04: qty 0.15

## 2017-04-10 DIAGNOSIS — C61 Malignant neoplasm of prostate: Secondary | ICD-10-CM | POA: Diagnosis not present

## 2017-04-10 DIAGNOSIS — Z08 Encounter for follow-up examination after completed treatment for malignant neoplasm: Secondary | ICD-10-CM | POA: Diagnosis not present

## 2017-04-10 DIAGNOSIS — Z8546 Personal history of malignant neoplasm of prostate: Secondary | ICD-10-CM | POA: Diagnosis not present

## 2017-04-19 ENCOUNTER — Inpatient Hospital Stay: Payer: PPO

## 2017-04-19 ENCOUNTER — Inpatient Hospital Stay (HOSPITAL_BASED_OUTPATIENT_CLINIC_OR_DEPARTMENT_OTHER): Payer: PPO | Admitting: Internal Medicine

## 2017-04-19 VITALS — BP 131/71 | HR 69 | Temp 97.6°F | Resp 20 | Wt 164.0 lb

## 2017-04-19 DIAGNOSIS — Z8546 Personal history of malignant neoplasm of prostate: Secondary | ICD-10-CM | POA: Diagnosis not present

## 2017-04-19 DIAGNOSIS — D693 Immune thrombocytopenic purpura: Secondary | ICD-10-CM

## 2017-04-19 DIAGNOSIS — D589 Hereditary hemolytic anemia, unspecified: Secondary | ICD-10-CM

## 2017-04-19 DIAGNOSIS — Z79899 Other long term (current) drug therapy: Secondary | ICD-10-CM

## 2017-04-19 LAB — CBC WITH DIFFERENTIAL/PLATELET
BASOS ABS: 0 10*3/uL (ref 0–0.1)
BASOS PCT: 1 %
EOS ABS: 0.2 10*3/uL (ref 0–0.7)
EOS PCT: 2 %
HCT: 30.6 % — ABNORMAL LOW (ref 40.0–52.0)
Hemoglobin: 11.1 g/dL — ABNORMAL LOW (ref 13.0–18.0)
Lymphocytes Relative: 13 %
Lymphs Abs: 1 10*3/uL (ref 1.0–3.6)
MCH: 36.7 pg — ABNORMAL HIGH (ref 26.0–34.0)
MCHC: 36.3 g/dL — ABNORMAL HIGH (ref 32.0–36.0)
MCV: 101.3 fL — AB (ref 80.0–100.0)
Monocytes Absolute: 0.7 10*3/uL (ref 0.2–1.0)
Monocytes Relative: 8 %
Neutro Abs: 6.3 10*3/uL (ref 1.4–6.5)
Neutrophils Relative %: 76 %
PLATELETS: 214 10*3/uL (ref 150–440)
RBC: 3.02 MIL/uL — ABNORMAL LOW (ref 4.40–5.90)
RDW: 14.1 % (ref 11.5–14.5)
WBC: 8.2 10*3/uL (ref 3.8–10.6)

## 2017-04-19 LAB — LACTATE DEHYDROGENASE: LDH: 306 U/L — AB (ref 98–192)

## 2017-04-19 MED ORDER — ROMIPLOSTIM 250 MCG ~~LOC~~ SOLR
1.0000 ug/kg | Freq: Once | SUBCUTANEOUS | Status: AC
  Administered 2017-04-19: 75 ug via SUBCUTANEOUS
  Filled 2017-04-19: qty 0.15

## 2017-04-19 NOTE — Progress Notes (Signed)
[facility] OFFICE PROGRESS NOTE  Patient Care Team: Derinda Late, MD as PCP - General (Family Medicine)   SUMMARY OF ONCOLOGIC HISTORY:  #FEB 2016-  ITP  s/p Prednisone; OCT 2016- Relapse of ITP- Prednisone; JAN 13th- START RITUXAN q W x4 [finished Feb 8th]; March 10th 2017- N-plate q W- good response  # Feb 2016- AUTOIMMUNE HEMOLYTIC ANEMIA; Relapsed AUG 2017- Prednisone; NOV 27th Start cellcept 500 BID.   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 Patient needing endplate every few weeks.  Patient is currently off prednisone; is currently on CellCept 1500 mg a day. Patient currently dong well. Denies any  Unusual fatigue.  Otherwise no nausea no vomiting. No dizziness. No difficulty swallowing or pain while swallowing.    REVIEW OF SYSTEMS:  A complete 10 point review of system is done which is negative except mentioned above/history of present illness.   PAST MEDICAL HISTORY :  Past Medical History:  Diagnosis Date  . Autoimmune hemolytic anemia (HCC)   . Dehydration   . Detached retina   . Hypercholesteremia   . Hypertension   . Insomnia   . 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 Leukocytosis   . Prostate cancer ([place])   . Shingles     PAST SURGICAL HISTORY :   Past Surgical History:  Procedure Laterality Date  . artificial left eye    . EYE SURGERY    . HERNIA REPAIR    . JOINT REPLACEMENT    . PROSTATE SURGERY      FAMILY HISTORY :   Family History  Problem Relation Age of Onset  . Cancer Mother   . Multiple sclerosis Father     SOCIAL HISTORY:   Social History  Substance Use Topics  . Smoking status: Never Smoker  . Smokeless tobacco: Not on file  . Alcohol use No    ALLERGIES:  is allergic to cephalexin and hydrocodone-acetaminophen.  MEDICATIONS:  Current  Outpatient Prescriptions  Medication Sig Dispense Refill  . acetaminophen (TYLENOL) 500 MG tablet Take 500 mg by mouth every 6 (six) hours as needed for mild pain. Reported on 01/24/2016    . citalopram (CELEXA) 10 MG tablet Take 10 mg by mouth daily.    . Cyanocobalamin (RA VITAMIN B-12 TR) 1000 MCG TBCR Take 1,000 mcg by mouth daily.    . feeding supplement, ENSURE ENLIVE, (ENSURE ENLIVE) LIQD Take 237 mLs by mouth 2 (two) times daily between meals. 237 mL 12  . gabapentin (NEURONTIN) 100 MG capsule Take 100 mg by mouth at bedtime. Reported on 12/03/2015    . lisinopril (PRINIVIL,ZESTRIL) 10 MG tablet Take by mouth.    . mycophenolate (CELLCEPT) 500 MG tablet Take 2 tablets in the morning and take 1 tablet in the afternoon (Patient taking differently: Take 1 tablets in the morning and take 2 tablet in the afternoon) 90 tablet 1  . omeprazole (PRILOSEC) 40 MG capsule TAKE 1 CAPSULE BY MOUTH USUALLY 30 MINUTES BEFORE BREAKFAST 90 capsule 2  . polyethylene glycol (MIRALAX / GLYCOLAX) packet Take 17 g by mouth daily as needed for mild constipation. 14 each 0  . pravastatin (PRAVACHOL) 20 MG tablet Take 20 mg by mouth at bedtime.     . furosemide (LASIX) 20 MG tablet Take 20 mg by mouth daily.    . polyvinyl alcohol (  LIQUIFILM TEARS) 1.4 % ophthalmic solution Place 2 drops into the left eye 3 (three) times daily as needed for dry eyes. 15 mL 0   No current facility-administered medications for this visit.     PHYSICAL EXAMINATION:   BP 131/71 (BP Location: Right Arm, Patient Position: Sitting)   Pulse 69   Temp 97.6 F (36.4 C) (Tympanic)   Resp 20   Wt 164 lb (74.4 kg)   BMI 26.23 kg/m   Filed Weights   04/19/17 1117  Weight: 164 lb (74.4 kg)    GENERAL: Well-nourished well-developed; Alert, no distress and comfortable.  Accompanied by his wife. EYES: no pallor or icterus OROPHARYNX: no thrush or ulceration; good dentition no bleeding noted. NECK: supple, no masses felt LYMPH:  no  palpable lymphadenopathy in the cervical, axillary or inguinal regions LUNGS: clear to auscultation and  No wheeze or crackles HEART/CVS: regular rate & rhythm and no murmurs; No lower extremity edema ABDOMEN:abdomen soft, non-tender and normal bowel sounds Musculoskeletal:no cyanosis of digits and no clubbing  PSYCH: alert & oriented x 3 with fluent speech NEURO: no focal motor/sensory deficits SKIN:  no rashes or significant lesions  LABORATORY DATA:  I have reviewed the data as listed    Component Value Date/Time   NA 134 (L) 02/19/2017 1354   NA 131 (L) 03/17/2015 1425   K 4.3 02/19/2017 1354   K 3.9 03/17/2015 1425   CL 103 02/19/2017 1354   CL 101 03/17/2015 1425   CO2 24 02/19/2017 1354   CO2 26 03/17/2015 1425   GLUCOSE 114 (H) 02/19/2017 1354   GLUCOSE 161 (H) 03/17/2015 1425   BUN 14 02/19/2017 1354   BUN 17 03/17/2015 1425   CREATININE 0.78 02/19/2017 1354   CREATININE 0.90 03/17/2015 1425   CALCIUM 8.8 (L) 02/19/2017 1354   CALCIUM 8.5 (L) 03/17/2015 1425   PROT 7.1 02/19/2017 1354   PROT 6.7 03/17/2015 1425   ALBUMIN 3.8 02/19/2017 1354   ALBUMIN 3.7 03/17/2015 1425   AST 26 02/19/2017 1354   AST 25 03/17/2015 1425   ALT 18 02/19/2017 1354   ALT 23 03/17/2015 1425   ALKPHOS 66 02/19/2017 1354   ALKPHOS 44 03/17/2015 1425   BILITOT 1.1 02/19/2017 1354   BILITOT 0.6 03/17/2015 1425   GFRNONAA >60 02/19/2017 1354   GFRNONAA >60 03/17/2015 1425   GFRAA >60 02/19/2017 1354   GFRAA >60 03/17/2015 1425    No results found for: SPEP, UPEP  Lab Results  Component Value Date   WBC 8.2 04/19/2017   NEUTROABS 6.3 04/19/2017   HGB 11.1 (L) 04/19/2017   HCT 30.6 (L) 04/19/2017   MCV 101.3 (H) 04/19/2017   PLT 214 04/19/2017      Chemistry      Component Value Date/Time   NA 134 (L) 02/19/2017 1354   NA 131 (L) 03/17/2015 1425   K 4.3 02/19/2017 1354   K 3.9 03/17/2015 1425   CL 103 02/19/2017 1354   CL 101 03/17/2015 1425   CO2 24 02/19/2017 1354    CO2 26 03/17/2015 1425   BUN 14 02/19/2017 1354   BUN 17 03/17/2015 1425   CREATININE 0.78 02/19/2017 1354   CREATININE 0.90 03/17/2015 1425      Component Value Date/Time   CALCIUM 8.8 (L) 02/19/2017 1354   CALCIUM 8.5 (L) 03/17/2015 1425   ALKPHOS 66 02/19/2017 1354   ALKPHOS 44 03/17/2015 1425   AST 26 02/19/2017 1354   AST 25 03/17/2015 1425  ALT 18 02/19/2017 1354   ALT 23 03/17/2015 1425   BILITOT 1.1 02/19/2017 1354   BILITOT 0.6 03/17/2015 1425       ASSESSMENT & PLAN:   Idiopathic thrombocytopenic purpura (HCC) # ITP- steroid responsive; relapsed Currently on third line therapy with N-plate; today platelets are 214 okay with N-plate today.   # Hemolytic anemia-LDH improved status post prednisone.  Hb 11.5/LDH- 270s-STABLE/improving. Continue Cellcept  At 1000 mg in AM; and continue 500 mg qhs.   # Patient will have a every 2 weeks labs [starting 11th ]- CBC-LDH/ n-plate.  Follow-up with me in 3 months.[name], MD 04/19/2017 7:59 PM

## 2017-04-19 NOTE — Assessment & Plan Note (Addendum)
#   ITP- steroid responsive; relapsed Currently on third line therapy with N-plate; today platelets are 214 okay with N-plate today.   # Hemolytic anemia-LDH improved status post prednisone.  Hb 11.5/LDH- 270s-STABLE/improving. Continue Cellcept  At 1000 mg in AM; and continue 500 mg qhs.   # Patient will have a every 2 weeks labs [starting 11th ]- CBC-LDH/ n-plate.  Follow-up with me in 3 months.Marland Kitchen

## 2017-04-30 ENCOUNTER — Inpatient Hospital Stay: Payer: PPO

## 2017-04-30 ENCOUNTER — Inpatient Hospital Stay: Payer: PPO | Attending: Internal Medicine

## 2017-04-30 DIAGNOSIS — E7800 Pure hypercholesterolemia, unspecified: Secondary | ICD-10-CM | POA: Insufficient documentation

## 2017-04-30 DIAGNOSIS — I10 Essential (primary) hypertension: Secondary | ICD-10-CM | POA: Insufficient documentation

## 2017-04-30 DIAGNOSIS — D693 Immune thrombocytopenic purpura: Secondary | ICD-10-CM

## 2017-04-30 DIAGNOSIS — Z8546 Personal history of malignant neoplasm of prostate: Secondary | ICD-10-CM | POA: Insufficient documentation

## 2017-04-30 DIAGNOSIS — Z79899 Other long term (current) drug therapy: Secondary | ICD-10-CM | POA: Insufficient documentation

## 2017-04-30 DIAGNOSIS — G4700 Insomnia, unspecified: Secondary | ICD-10-CM | POA: Insufficient documentation

## 2017-04-30 DIAGNOSIS — D589 Hereditary hemolytic anemia, unspecified: Secondary | ICD-10-CM | POA: Diagnosis not present

## 2017-04-30 LAB — CBC WITH DIFFERENTIAL/PLATELET
BASOS ABS: 0 10*3/uL (ref 0–0.1)
BASOS PCT: 1 %
Eosinophils Absolute: 0.2 10*3/uL (ref 0–0.7)
Eosinophils Relative: 2 %
HEMATOCRIT: 29.7 % — AB (ref 40.0–52.0)
HEMOGLOBIN: 10.7 g/dL — AB (ref 13.0–18.0)
LYMPHS PCT: 15 %
Lymphs Abs: 1.2 10*3/uL (ref 1.0–3.6)
MCH: 36.4 pg — ABNORMAL HIGH (ref 26.0–34.0)
MCHC: 36.1 g/dL — ABNORMAL HIGH (ref 32.0–36.0)
MCV: 100.7 fL — AB (ref 80.0–100.0)
Monocytes Absolute: 0.7 10*3/uL (ref 0.2–1.0)
Monocytes Relative: 9 %
NEUTROS ABS: 5.9 10*3/uL (ref 1.4–6.5)
NEUTROS PCT: 73 %
Platelets: 148 10*3/uL — ABNORMAL LOW (ref 150–440)
RBC: 2.95 MIL/uL — ABNORMAL LOW (ref 4.40–5.90)
RDW: 14.3 % (ref 11.5–14.5)
WBC: 8 10*3/uL (ref 3.8–10.6)

## 2017-04-30 LAB — LACTATE DEHYDROGENASE: LDH: 295 U/L — ABNORMAL HIGH (ref 98–192)

## 2017-04-30 MED ORDER — ROMIPLOSTIM 250 MCG ~~LOC~~ SOLR
1.0000 ug/kg | Freq: Once | SUBCUTANEOUS | Status: AC
  Administered 2017-04-30: 75 ug via SUBCUTANEOUS
  Filled 2017-04-30: qty 0.15

## 2017-05-04 ENCOUNTER — Other Ambulatory Visit: Payer: Self-pay | Admitting: Internal Medicine

## 2017-05-04 DIAGNOSIS — D693 Immune thrombocytopenic purpura: Secondary | ICD-10-CM

## 2017-05-04 NOTE — Telephone Encounter (Signed)
ASSESSMENT & PLAN:   Idiopathic thrombocytopenic purpura ([place]) # ITP- steroid responsive; relapsed Currently on third line therapy with N-plate; today platelets are 214 okay with N-plate today.   # Hemolytic anemia-LDH improved status post prednisone.  Hb 11.5/LDH- 270s-STABLE/improving. Continue Cellcept  At 1000 mg in AM; and continue 500 mg qhs.   # Patient will have a every 2 weeks labs [starting 11th ]- CBC-LDH/ n-plate.  Follow-up with me in 3 months.[name], MD 04/19/2017 7:59 PM

## 2017-05-14 ENCOUNTER — Inpatient Hospital Stay: Payer: PPO

## 2017-05-14 DIAGNOSIS — D693 Immune thrombocytopenic purpura: Secondary | ICD-10-CM

## 2017-05-14 LAB — CBC WITH DIFFERENTIAL/PLATELET
BASOS ABS: 0 10*3/uL (ref 0–0.1)
BASOS PCT: 1 %
Eosinophils Absolute: 0.3 10*3/uL (ref 0–0.7)
Eosinophils Relative: 4 %
HEMATOCRIT: 29.9 % — AB (ref 40.0–52.0)
Hemoglobin: 10.9 g/dL — ABNORMAL LOW (ref 13.0–18.0)
Lymphocytes Relative: 17 %
Lymphs Abs: 1.3 10*3/uL (ref 1.0–3.6)
MCH: 36.4 pg — ABNORMAL HIGH (ref 26.0–34.0)
MCHC: 36.4 g/dL — ABNORMAL HIGH (ref 32.0–36.0)
MCV: 99.8 fL (ref 80.0–100.0)
MONO ABS: 0.6 10*3/uL (ref 0.2–1.0)
Monocytes Relative: 8 %
NEUTROS ABS: 5.4 10*3/uL (ref 1.4–6.5)
Neutrophils Relative %: 70 %
PLATELETS: 143 10*3/uL — AB (ref 150–440)
RBC: 3 MIL/uL — AB (ref 4.40–5.90)
RDW: 13.8 % (ref 11.5–14.5)
WBC: 7.7 10*3/uL (ref 3.8–10.6)

## 2017-05-14 LAB — LACTATE DEHYDROGENASE: LDH: 277 U/L — ABNORMAL HIGH (ref 98–192)

## 2017-05-14 MED ORDER — ROMIPLOSTIM 250 MCG ~~LOC~~ SOLR
75.0000 ug | Freq: Once | SUBCUTANEOUS | Status: AC
  Administered 2017-05-14: 75 ug via SUBCUTANEOUS
  Filled 2017-05-14: qty 0.15

## 2017-05-28 ENCOUNTER — Inpatient Hospital Stay: Payer: PPO

## 2017-05-28 ENCOUNTER — Inpatient Hospital Stay: Payer: PPO | Attending: Internal Medicine

## 2017-05-28 DIAGNOSIS — I10 Essential (primary) hypertension: Secondary | ICD-10-CM | POA: Diagnosis not present

## 2017-05-28 DIAGNOSIS — G4700 Insomnia, unspecified: Secondary | ICD-10-CM | POA: Insufficient documentation

## 2017-05-28 DIAGNOSIS — D591 Other autoimmune hemolytic anemias: Secondary | ICD-10-CM | POA: Diagnosis not present

## 2017-05-28 DIAGNOSIS — Z8546 Personal history of malignant neoplasm of prostate: Secondary | ICD-10-CM | POA: Diagnosis not present

## 2017-05-28 DIAGNOSIS — D693 Immune thrombocytopenic purpura: Secondary | ICD-10-CM

## 2017-05-28 DIAGNOSIS — E860 Dehydration: Secondary | ICD-10-CM | POA: Diagnosis not present

## 2017-05-28 DIAGNOSIS — Z79899 Other long term (current) drug therapy: Secondary | ICD-10-CM | POA: Diagnosis not present

## 2017-05-28 DIAGNOSIS — E7800 Pure hypercholesterolemia, unspecified: Secondary | ICD-10-CM | POA: Diagnosis not present

## 2017-05-28 LAB — CBC WITH DIFFERENTIAL/PLATELET
BASOS ABS: 0 10*3/uL (ref 0–0.1)
BASOS PCT: 1 %
Eosinophils Absolute: 0.3 10*3/uL (ref 0–0.7)
Eosinophils Relative: 4 %
HEMATOCRIT: 31.7 % — AB (ref 40.0–52.0)
HEMOGLOBIN: 11.5 g/dL — AB (ref 13.0–18.0)
Lymphocytes Relative: 18 %
Lymphs Abs: 1.4 10*3/uL (ref 1.0–3.6)
MCH: 36.2 pg — ABNORMAL HIGH (ref 26.0–34.0)
MCHC: 36.4 g/dL — AB (ref 32.0–36.0)
MCV: 99.6 fL (ref 80.0–100.0)
MONOS PCT: 9 %
Monocytes Absolute: 0.7 10*3/uL (ref 0.2–1.0)
NEUTROS ABS: 5.1 10*3/uL (ref 1.4–6.5)
NEUTROS PCT: 68 %
Platelets: 186 10*3/uL (ref 150–440)
RBC: 3.18 MIL/uL — ABNORMAL LOW (ref 4.40–5.90)
RDW: 13.5 % (ref 11.5–14.5)
WBC: 7.5 10*3/uL (ref 3.8–10.6)

## 2017-05-28 LAB — LACTATE DEHYDROGENASE: LDH: 277 U/L — ABNORMAL HIGH (ref 98–192)

## 2017-05-28 MED ORDER — ROMIPLOSTIM 250 MCG ~~LOC~~ SOLR
75.0000 ug | Freq: Once | SUBCUTANEOUS | Status: AC
  Administered 2017-05-28: 75 ug via SUBCUTANEOUS
  Filled 2017-05-28: qty 0.15

## 2017-06-11 ENCOUNTER — Inpatient Hospital Stay: Payer: PPO

## 2017-06-11 ENCOUNTER — Inpatient Hospital Stay (HOSPITAL_BASED_OUTPATIENT_CLINIC_OR_DEPARTMENT_OTHER): Payer: PPO | Admitting: Internal Medicine

## 2017-06-11 ENCOUNTER — Other Ambulatory Visit: Payer: Self-pay | Admitting: Oncology

## 2017-06-11 VITALS — BP 118/61 | HR 63 | Temp 97.6°F | Resp 18 | Ht 66.3 in | Wt 164.4 lb

## 2017-06-11 DIAGNOSIS — D591 Other autoimmune hemolytic anemias: Secondary | ICD-10-CM | POA: Diagnosis not present

## 2017-06-11 DIAGNOSIS — Z8546 Personal history of malignant neoplasm of prostate: Secondary | ICD-10-CM

## 2017-06-11 DIAGNOSIS — D590 Drug-induced autoimmune hemolytic anemia: Secondary | ICD-10-CM

## 2017-06-11 DIAGNOSIS — D693 Immune thrombocytopenic purpura: Secondary | ICD-10-CM | POA: Diagnosis not present

## 2017-06-11 DIAGNOSIS — Z79899 Other long term (current) drug therapy: Secondary | ICD-10-CM

## 2017-06-11 LAB — CBC WITH DIFFERENTIAL/PLATELET
BASOS ABS: 0 10*3/uL (ref 0–0.1)
Basophils Relative: 0 %
Eosinophils Absolute: 0.3 10*3/uL (ref 0–0.7)
Eosinophils Relative: 3 %
HEMATOCRIT: 31.9 % — AB (ref 40.0–52.0)
Hemoglobin: 11.5 g/dL — ABNORMAL LOW (ref 13.0–18.0)
LYMPHS ABS: 1.3 10*3/uL (ref 1.0–3.6)
LYMPHS PCT: 16 %
MCH: 35.7 pg — ABNORMAL HIGH (ref 26.0–34.0)
MCHC: 35.9 g/dL (ref 32.0–36.0)
MCV: 99.5 fL (ref 80.0–100.0)
MONO ABS: 0.6 10*3/uL (ref 0.2–1.0)
Monocytes Relative: 8 %
NEUTROS ABS: 5.7 10*3/uL (ref 1.4–6.5)
Neutrophils Relative %: 73 %
Platelets: 162 10*3/uL (ref 150–440)
RBC: 3.21 MIL/uL — AB (ref 4.40–5.90)
RDW: 13.9 % (ref 11.5–14.5)
WBC: 7.9 10*3/uL (ref 3.8–10.6)

## 2017-06-11 LAB — LACTATE DEHYDROGENASE: LDH: 255 U/L — ABNORMAL HIGH (ref 98–192)

## 2017-06-11 NOTE — Assessment & Plan Note (Addendum)
#   ITP- steroid responsive; relapsed Currently on third line therapy with N-plate; today platelets 160. Hold N-plate today. He is interested to space out labs /injections to every 3 weeks; we will proceed with N-plate if less than 340.   # Hemolytic anemia-LDH improved status post prednisone.  Hb 11.5/LDH- 220s-STABLE/improving. Continue Cellcept  At 1000 mg in AM; and continue 500 mg qhs. Prescription for CellCept given.   # Patient will have a every 3 weeks labs / CBC-LDH/ n-plate.  Follow-up with me in 3 months.

## 2017-06-11 NOTE — Progress Notes (Signed)
[facility] OFFICE PROGRESS NOTE  Patient Care Team: Derinda Late, MD as PCP - General (Family Medicine)   SUMMARY OF ONCOLOGIC HISTORY:  #FEB 2016-  ITP  s/p Prednisone; OCT 2016- Relapse of ITP- Prednisone; JAN 13th- START RITUXAN q W x4 [finished Feb 8th]; March 10th 2017- N-plate q W- good response  # Feb 2016- AUTOIMMUNE HEMOLYTIC ANEMIA; Relapsed AUG 2017- Prednisone; NOV 27th Start cellcept 500 BID.   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 Patient needing endplate every few weeks.  Patient continues to be on CellCept 1500 mg a day. Patient currently dong well. Patient has mild-to-moderate fatigue needing a nap in the afternoon. Otherwise no dizzy spells. No falls.  No syncopal episodes. He continues to enjoy visits to the beach/playing golf. Denies any epistaxis or gum bleeding.  REVIEW OF SYSTEMS:  A complete 10 point review of system is done which is negative except mentioned above/history of present illness.   PAST MEDICAL HISTORY :  Past Medical History:  Diagnosis Date  . Autoimmune hemolytic anemia (HCC)   . Dehydration   . Detached retina   . Hypercholesteremia   . Hypertension   . Insomnia   . 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 Leukocytosis   . Prostate cancer ([place])   . Shingles     PAST SURGICAL HISTORY :   Past Surgical History:  Procedure Laterality Date  . artificial left eye    . EYE SURGERY    . HERNIA REPAIR    . JOINT REPLACEMENT    . PROSTATE SURGERY      FAMILY HISTORY :   Family History  Problem Relation Age of Onset  . Cancer Mother   . Multiple sclerosis Father     SOCIAL HISTORY:   Social History  Substance Use Topics  . Smoking status: Never Smoker  . Smokeless tobacco: Not on file  . Alcohol use No    ALLERGIES:  is allergic to cephalexin  and hydrocodone-acetaminophen.  MEDICATIONS:  Current Outpatient Prescriptions  Medication Sig Dispense Refill  . acetaminophen (TYLENOL) 500 MG tablet Take 500 mg by mouth every 6 (six) hours as needed for mild pain. Reported on 01/24/2016    . citalopram (CELEXA) 10 MG tablet Take 10 mg by mouth daily.    . Cyanocobalamin (RA VITAMIN B-12 TR) 1000 MCG TBCR Take 1,000 mcg by mouth daily.    . feeding supplement, ENSURE ENLIVE, (ENSURE ENLIVE) LIQD Take 237 mLs by mouth 2 (two) times daily between meals. 237 mL 12  . gabapentin (NEURONTIN) 100 MG capsule Take 100 mg by mouth at bedtime. Reported on 12/03/2015    . lisinopril (PRINIVIL,ZESTRIL) 10 MG tablet Take 10 mg by mouth daily.     . mycophenolate (CELLCEPT) 500 MG tablet TAKE 2 TABLETS BY MOUTH EVERY MORNING AND 1 TABLET IN THE AFTERNOON 90 tablet 3  . omeprazole (PRILOSEC) 40 MG capsule TAKE 1 CAPSULE BY MOUTH USUALLY 30 MINUTES BEFORE BREAKFAST 90 capsule 2  . polyethylene glycol (MIRALAX / GLYCOLAX) packet Take 17 g by mouth daily as needed for mild constipation. 14 each 0  . polyvinyl alcohol (LIQUIFILM TEARS) 1.4 % ophthalmic solution Place 2 drops into the left eye 3 (three) times daily as needed for dry eyes. 15 mL 0  . pravastatin (PRAVACHOL) 20 MG tablet Take 20  mg by mouth at bedtime.     . furosemide (LASIX) 20 MG tablet Take 20 mg by mouth daily.     No current facility-administered medications for this visit.     PHYSICAL EXAMINATION:   BP 118/61 (BP Location: Right Arm, Patient Position: Sitting)   Pulse 63   Temp 97.6 F (36.4 C)   Resp 18   Ht 5' 6.3" (1.684 m)   Wt 164 lb 6.4 oz (74.6 kg)   BMI 26.30 kg/m   Filed Weights   06/11/17 1433  Weight: 164 lb 6.4 oz (74.6 kg)    GENERAL: Well-nourished well-developed; Alert, no distress and comfortable.  Accompanied by his wife. EYES: no pallor or icterus OROPHARYNX: no thrush or ulceration; good dentition no bleeding noted. NECK: supple, no masses felt LYMPH:   no palpable lymphadenopathy in the cervical, axillary or inguinal regions LUNGS: clear to auscultation and  No wheeze or crackles HEART/CVS: regular rate & rhythm and no murmurs; No lower extremity edema ABDOMEN:abdomen soft, non-tender and normal bowel sounds Musculoskeletal:no cyanosis of digits and no clubbing  PSYCH: alert & oriented x 3 with fluent speech NEURO: no focal motor/sensory deficits SKIN:  no rashes or significant lesions  LABORATORY DATA:  I have reviewed the data as listed    Component Value Date/Time   NA 134 (L) 02/19/2017 1354   NA 131 (L) 03/17/2015 1425   K 4.3 02/19/2017 1354   K 3.9 03/17/2015 1425   CL 103 02/19/2017 1354   CL 101 03/17/2015 1425   CO2 24 02/19/2017 1354   CO2 26 03/17/2015 1425   GLUCOSE 114 (H) 02/19/2017 1354   GLUCOSE 161 (H) 03/17/2015 1425   BUN 14 02/19/2017 1354   BUN 17 03/17/2015 1425   CREATININE 0.78 02/19/2017 1354   CREATININE 0.90 03/17/2015 1425   CALCIUM 8.8 (L) 02/19/2017 1354   CALCIUM 8.5 (L) 03/17/2015 1425   PROT 7.1 02/19/2017 1354   PROT 6.7 03/17/2015 1425   ALBUMIN 3.8 02/19/2017 1354   ALBUMIN 3.7 03/17/2015 1425   AST 26 02/19/2017 1354   AST 25 03/17/2015 1425   ALT 18 02/19/2017 1354   ALT 23 03/17/2015 1425   ALKPHOS 66 02/19/2017 1354   ALKPHOS 44 03/17/2015 1425   BILITOT 1.1 02/19/2017 1354   BILITOT 0.6 03/17/2015 1425   GFRNONAA >60 02/19/2017 1354   GFRNONAA >60 03/17/2015 1425   GFRAA >60 02/19/2017 1354   GFRAA >60 03/17/2015 1425    No results found for: SPEP, UPEP  Lab Results  Component Value Date   WBC 7.9 06/11/2017   NEUTROABS 5.7 06/11/2017   HGB 11.5 (L) 06/11/2017   HCT 31.9 (L) 06/11/2017   MCV 99.5 06/11/2017   PLT 162 06/11/2017      Chemistry      Component Value Date/Time   NA 134 (L) 02/19/2017 1354   NA 131 (L) 03/17/2015 1425   K 4.3 02/19/2017 1354   K 3.9 03/17/2015 1425   CL 103 02/19/2017 1354   CL 101 03/17/2015 1425   CO2 24 02/19/2017 1354    CO2 26 03/17/2015 1425   BUN 14 02/19/2017 1354   BUN 17 03/17/2015 1425   CREATININE 0.78 02/19/2017 1354   CREATININE 0.90 03/17/2015 1425      Component Value Date/Time   CALCIUM 8.8 (L) 02/19/2017 1354   CALCIUM 8.5 (L) 03/17/2015 1425   ALKPHOS 66 02/19/2017 1354   ALKPHOS 44 03/17/2015 1425   AST 26 02/19/2017 1354  AST 25 03/17/2015 1425   ALT 18 02/19/2017 1354   ALT 23 03/17/2015 1425   BILITOT 1.1 02/19/2017 1354   BILITOT 0.6 03/17/2015 1425       ASSESSMENT & PLAN:   Idiopathic thrombocytopenic purpura (HCC) # ITP- steroid responsive; relapsed Currently on third line therapy with N-plate; today platelets 160. Hold N-plate today. He is interested to space out labs /injections to every 3 weeks; we will proceed with N-plate if less than 374.   # Hemolytic anemia-LDH improved status post prednisone.  Hb 11.5/LDH- 220s-STABLE/improving. Continue Cellcept  At 1000 mg in AM; and continue 500 mg qhs. Prescription for CellCept given.   # Patient will have a every 3 weeks labs / CBC-LDH/ n-plate.  Follow-up with me in 3 months.     [name], MD 06/11/2017 4:27 PM

## 2017-06-11 NOTE — Progress Notes (Signed)
Patient here for followup for ITP.

## 2017-06-12 DIAGNOSIS — H40041 Steroid responder, right eye: Secondary | ICD-10-CM | POA: Diagnosis not present

## 2017-06-12 DIAGNOSIS — Z961 Presence of intraocular lens: Secondary | ICD-10-CM | POA: Diagnosis not present

## 2017-06-12 DIAGNOSIS — H40051 Ocular hypertension, right eye: Secondary | ICD-10-CM | POA: Diagnosis not present

## 2017-06-12 NOTE — Telephone Encounter (Signed)
This is Dr. B's patient

## 2017-07-02 ENCOUNTER — Inpatient Hospital Stay: Payer: PPO

## 2017-07-02 ENCOUNTER — Inpatient Hospital Stay: Payer: PPO | Attending: Internal Medicine

## 2017-07-02 DIAGNOSIS — D693 Immune thrombocytopenic purpura: Secondary | ICD-10-CM | POA: Diagnosis not present

## 2017-07-02 DIAGNOSIS — D591 Other autoimmune hemolytic anemias: Secondary | ICD-10-CM | POA: Diagnosis not present

## 2017-07-02 DIAGNOSIS — G4700 Insomnia, unspecified: Secondary | ICD-10-CM | POA: Diagnosis not present

## 2017-07-02 DIAGNOSIS — Z79899 Other long term (current) drug therapy: Secondary | ICD-10-CM | POA: Insufficient documentation

## 2017-07-02 DIAGNOSIS — E860 Dehydration: Secondary | ICD-10-CM | POA: Insufficient documentation

## 2017-07-02 DIAGNOSIS — I10 Essential (primary) hypertension: Secondary | ICD-10-CM | POA: Insufficient documentation

## 2017-07-02 DIAGNOSIS — D590 Drug-induced autoimmune hemolytic anemia: Secondary | ICD-10-CM

## 2017-07-02 DIAGNOSIS — Z8546 Personal history of malignant neoplasm of prostate: Secondary | ICD-10-CM | POA: Insufficient documentation

## 2017-07-02 DIAGNOSIS — E7800 Pure hypercholesterolemia, unspecified: Secondary | ICD-10-CM | POA: Insufficient documentation

## 2017-07-02 LAB — CBC WITH DIFFERENTIAL/PLATELET
Basophils Absolute: 0.1 10*3/uL (ref 0–0.1)
Basophils Relative: 1 %
EOS ABS: 0.3 10*3/uL (ref 0–0.7)
Eosinophils Relative: 3 %
HEMATOCRIT: 33.3 % — AB (ref 40.0–52.0)
HEMOGLOBIN: 12 g/dL — AB (ref 13.0–18.0)
LYMPHS ABS: 1.4 10*3/uL (ref 1.0–3.6)
Lymphocytes Relative: 18 %
MCH: 35.8 pg — AB (ref 26.0–34.0)
MCHC: 36.1 g/dL — AB (ref 32.0–36.0)
MCV: 99.1 fL (ref 80.0–100.0)
MONO ABS: 0.6 10*3/uL (ref 0.2–1.0)
MONOS PCT: 8 %
NEUTROS PCT: 70 %
Neutro Abs: 5.5 10*3/uL (ref 1.4–6.5)
Platelets: 154 10*3/uL (ref 150–440)
RBC: 3.35 MIL/uL — ABNORMAL LOW (ref 4.40–5.90)
RDW: 13.8 % (ref 11.5–14.5)
WBC: 7.8 10*3/uL (ref 3.8–10.6)

## 2017-07-02 LAB — LACTATE DEHYDROGENASE: LDH: 275 U/L — ABNORMAL HIGH (ref 98–192)

## 2017-07-17 DIAGNOSIS — E7800 Pure hypercholesterolemia, unspecified: Secondary | ICD-10-CM | POA: Diagnosis not present

## 2017-07-17 DIAGNOSIS — I10 Essential (primary) hypertension: Secondary | ICD-10-CM | POA: Diagnosis not present

## 2017-07-24 ENCOUNTER — Inpatient Hospital Stay: Payer: PPO | Attending: Internal Medicine

## 2017-07-24 ENCOUNTER — Inpatient Hospital Stay: Payer: PPO

## 2017-07-24 DIAGNOSIS — I10 Essential (primary) hypertension: Secondary | ICD-10-CM | POA: Diagnosis not present

## 2017-07-24 DIAGNOSIS — Z79899 Other long term (current) drug therapy: Secondary | ICD-10-CM | POA: Diagnosis not present

## 2017-07-24 DIAGNOSIS — E7800 Pure hypercholesterolemia, unspecified: Secondary | ICD-10-CM | POA: Diagnosis not present

## 2017-07-24 DIAGNOSIS — D693 Immune thrombocytopenic purpura: Secondary | ICD-10-CM | POA: Diagnosis not present

## 2017-07-24 DIAGNOSIS — D591 Other autoimmune hemolytic anemias: Secondary | ICD-10-CM | POA: Insufficient documentation

## 2017-07-24 DIAGNOSIS — G4700 Insomnia, unspecified: Secondary | ICD-10-CM | POA: Diagnosis not present

## 2017-07-24 DIAGNOSIS — E860 Dehydration: Secondary | ICD-10-CM | POA: Insufficient documentation

## 2017-07-24 DIAGNOSIS — Z8546 Personal history of malignant neoplasm of prostate: Secondary | ICD-10-CM | POA: Diagnosis not present

## 2017-07-24 LAB — LACTATE DEHYDROGENASE: LDH: 243 U/L — ABNORMAL HIGH (ref 98–192)

## 2017-07-24 LAB — CBC WITH DIFFERENTIAL/PLATELET
Basophils Absolute: 0.1 10*3/uL (ref 0–0.1)
Basophils Relative: 1 %
EOS PCT: 2 %
Eosinophils Absolute: 0.2 10*3/uL (ref 0–0.7)
HCT: 33.4 % — ABNORMAL LOW (ref 40.0–52.0)
Hemoglobin: 12.1 g/dL — ABNORMAL LOW (ref 13.0–18.0)
LYMPHS ABS: 1.4 10*3/uL (ref 1.0–3.6)
LYMPHS PCT: 16 %
MCH: 35.7 pg — AB (ref 26.0–34.0)
MCHC: 36.2 g/dL — AB (ref 32.0–36.0)
MCV: 98.6 fL (ref 80.0–100.0)
MONO ABS: 0.8 10*3/uL (ref 0.2–1.0)
MONOS PCT: 9 %
Neutro Abs: 5.9 10*3/uL (ref 1.4–6.5)
Neutrophils Relative %: 72 %
PLATELETS: 126 10*3/uL — AB (ref 150–440)
RBC: 3.39 MIL/uL — ABNORMAL LOW (ref 4.40–5.90)
RDW: 13.5 % (ref 11.5–14.5)
WBC: 8.3 10*3/uL (ref 3.8–10.6)

## 2017-08-13 ENCOUNTER — Inpatient Hospital Stay: Payer: PPO

## 2017-08-13 ENCOUNTER — Telehealth: Payer: Self-pay

## 2017-08-13 DIAGNOSIS — D693 Immune thrombocytopenic purpura: Secondary | ICD-10-CM

## 2017-08-13 LAB — CBC WITH DIFFERENTIAL/PLATELET
BASOS ABS: 0 10*3/uL (ref 0–0.1)
BASOS PCT: 1 %
EOS ABS: 0.3 10*3/uL (ref 0–0.7)
Eosinophils Relative: 4 %
HCT: 33.7 % — ABNORMAL LOW (ref 40.0–52.0)
Hemoglobin: 12.1 g/dL — ABNORMAL LOW (ref 13.0–18.0)
Lymphocytes Relative: 18 %
Lymphs Abs: 1.4 10*3/uL (ref 1.0–3.6)
MCH: 35.1 pg — ABNORMAL HIGH (ref 26.0–34.0)
MCHC: 35.8 g/dL (ref 32.0–36.0)
MCV: 98.1 fL (ref 80.0–100.0)
Monocytes Absolute: 0.7 10*3/uL (ref 0.2–1.0)
Monocytes Relative: 9 %
Neutro Abs: 5.3 10*3/uL (ref 1.4–6.5)
Neutrophils Relative %: 68 %
PLATELETS: 99 10*3/uL — AB (ref 150–440)
RBC: 3.43 MIL/uL — AB (ref 4.40–5.90)
RDW: 13.4 % (ref 11.5–14.5)
WBC: 7.8 10*3/uL (ref 3.8–10.6)

## 2017-08-13 LAB — LACTATE DEHYDROGENASE: LDH: 241 U/L — AB (ref 98–192)

## 2017-08-13 MED ORDER — ROMIPLOSTIM 250 MCG ~~LOC~~ SOLR
75.0000 ug | Freq: Once | SUBCUTANEOUS | Status: AC
  Administered 2017-08-13: 75 ug via SUBCUTANEOUS
  Filled 2017-08-13: qty 0.15

## 2017-08-13 NOTE — Telephone Encounter (Signed)
At last visit [name] plan changed to labs/Nplate (if platelets below 120).  Platelets today 99 and is scheduled to return in 3 weeks. Patient and his wife would like to know if he should wait 3 weeks for next appt.

## 2017-08-14 ENCOUNTER — Other Ambulatory Visit: Payer: Self-pay | Admitting: *Deleted

## 2017-08-14 DIAGNOSIS — D693 Immune thrombocytopenic purpura: Secondary | ICD-10-CM

## 2017-08-14 NOTE — Telephone Encounter (Signed)
Labs entered per md order 

## 2017-08-14 NOTE — Telephone Encounter (Signed)
Per md, Repeat lab/ cbc in 1 week/N-plate. [place], Please schedule and inform pt's / wife.

## 2017-08-20 ENCOUNTER — Inpatient Hospital Stay: Payer: PPO

## 2017-08-20 ENCOUNTER — Inpatient Hospital Stay: Payer: PPO | Attending: Internal Medicine

## 2017-08-20 DIAGNOSIS — Z8546 Personal history of malignant neoplasm of prostate: Secondary | ICD-10-CM | POA: Insufficient documentation

## 2017-08-20 DIAGNOSIS — Z79899 Other long term (current) drug therapy: Secondary | ICD-10-CM | POA: Insufficient documentation

## 2017-08-20 DIAGNOSIS — D589 Hereditary hemolytic anemia, unspecified: Secondary | ICD-10-CM | POA: Diagnosis not present

## 2017-08-20 DIAGNOSIS — E7800 Pure hypercholesterolemia, unspecified: Secondary | ICD-10-CM | POA: Diagnosis not present

## 2017-08-20 DIAGNOSIS — E860 Dehydration: Secondary | ICD-10-CM | POA: Diagnosis not present

## 2017-08-20 DIAGNOSIS — M7989 Other specified soft tissue disorders: Secondary | ICD-10-CM | POA: Insufficient documentation

## 2017-08-20 DIAGNOSIS — D693 Immune thrombocytopenic purpura: Secondary | ICD-10-CM | POA: Insufficient documentation

## 2017-08-20 DIAGNOSIS — I10 Essential (primary) hypertension: Secondary | ICD-10-CM | POA: Insufficient documentation

## 2017-08-20 DIAGNOSIS — G4700 Insomnia, unspecified: Secondary | ICD-10-CM | POA: Diagnosis not present

## 2017-08-20 LAB — CBC WITH DIFFERENTIAL/PLATELET
BASOS ABS: 0 10*3/uL (ref 0–0.1)
BASOS PCT: 1 %
EOS ABS: 0.2 10*3/uL (ref 0–0.7)
EOS PCT: 2 %
HEMATOCRIT: 32.9 % — AB (ref 40.0–52.0)
Hemoglobin: 11.9 g/dL — ABNORMAL LOW (ref 13.0–18.0)
Lymphocytes Relative: 16 %
Lymphs Abs: 1.4 10*3/uL (ref 1.0–3.6)
MCH: 35.4 pg — ABNORMAL HIGH (ref 26.0–34.0)
MCHC: 36.2 g/dL — AB (ref 32.0–36.0)
MCV: 97.7 fL (ref 80.0–100.0)
MONO ABS: 0.6 10*3/uL (ref 0.2–1.0)
Monocytes Relative: 8 %
Neutro Abs: 6.2 10*3/uL (ref 1.4–6.5)
Neutrophils Relative %: 73 %
Platelets: 183 10*3/uL (ref 150–440)
RBC: 3.37 MIL/uL — AB (ref 4.40–5.90)
RDW: 13.4 % (ref 11.5–14.5)
WBC: 8.4 10*3/uL (ref 3.8–10.6)

## 2017-08-24 DIAGNOSIS — M7022 Olecranon bursitis, left elbow: Secondary | ICD-10-CM | POA: Diagnosis not present

## 2017-08-28 DIAGNOSIS — H35371 Puckering of macula, right eye: Secondary | ICD-10-CM | POA: Diagnosis not present

## 2017-08-28 DIAGNOSIS — Z961 Presence of intraocular lens: Secondary | ICD-10-CM | POA: Diagnosis not present

## 2017-08-28 DIAGNOSIS — H3323 Serous retinal detachment, bilateral: Secondary | ICD-10-CM | POA: Diagnosis not present

## 2017-08-28 DIAGNOSIS — H40051 Ocular hypertension, right eye: Secondary | ICD-10-CM | POA: Diagnosis not present

## 2017-08-28 DIAGNOSIS — H43811 Vitreous degeneration, right eye: Secondary | ICD-10-CM | POA: Diagnosis not present

## 2017-09-03 ENCOUNTER — Inpatient Hospital Stay: Payer: PPO

## 2017-09-03 ENCOUNTER — Inpatient Hospital Stay (HOSPITAL_BASED_OUTPATIENT_CLINIC_OR_DEPARTMENT_OTHER): Payer: PPO | Admitting: Internal Medicine

## 2017-09-03 VITALS — BP 99/61 | HR 76 | Temp 97.6°F | Resp 20 | Ht 66.3 in | Wt 167.4 lb

## 2017-09-03 DIAGNOSIS — D589 Hereditary hemolytic anemia, unspecified: Secondary | ICD-10-CM | POA: Diagnosis not present

## 2017-09-03 DIAGNOSIS — D693 Immune thrombocytopenic purpura: Secondary | ICD-10-CM

## 2017-09-03 DIAGNOSIS — M7989 Other specified soft tissue disorders: Secondary | ICD-10-CM

## 2017-09-03 DIAGNOSIS — Z79899 Other long term (current) drug therapy: Secondary | ICD-10-CM | POA: Diagnosis not present

## 2017-09-03 LAB — CBC WITH DIFFERENTIAL/PLATELET
BASOS PCT: 1 %
Basophils Absolute: 0 10*3/uL (ref 0–0.1)
EOS ABS: 0.3 10*3/uL (ref 0–0.7)
EOS PCT: 3 %
HCT: 35.2 % — ABNORMAL LOW (ref 40.0–52.0)
HEMOGLOBIN: 12.3 g/dL — AB (ref 13.0–18.0)
Lymphocytes Relative: 19 %
Lymphs Abs: 1.6 10*3/uL (ref 1.0–3.6)
MCH: 34.9 pg — ABNORMAL HIGH (ref 26.0–34.0)
MCHC: 34.9 g/dL (ref 32.0–36.0)
MCV: 99.9 fL (ref 80.0–100.0)
MONOS PCT: 8 %
Monocytes Absolute: 0.7 10*3/uL (ref 0.2–1.0)
NEUTROS PCT: 69 %
Neutro Abs: 5.9 10*3/uL (ref 1.4–6.5)
PLATELETS: 95 10*3/uL — AB (ref 150–440)
RBC: 3.53 MIL/uL — AB (ref 4.40–5.90)
RDW: 13.3 % (ref 11.5–14.5)
WBC: 8.5 10*3/uL (ref 3.8–10.6)

## 2017-09-03 LAB — LACTATE DEHYDROGENASE: LDH: 244 U/L — AB (ref 98–192)

## 2017-09-03 MED ORDER — ROMIPLOSTIM 250 MCG ~~LOC~~ SOLR
1.0000 ug/kg | Freq: Once | SUBCUTANEOUS | Status: AC
  Administered 2017-09-03: 75 ug via SUBCUTANEOUS
  Filled 2017-09-03: qty 0.15

## 2017-09-03 NOTE — Progress Notes (Signed)
[place] OFFICE PROGRESS NOTE  Patient Care Team: Derinda Late, MD as PCP - General (Family Medicine)   SUMMARY OF ONCOLOGIC HISTORY:  #FEB 2016-  ITP  s/p Prednisone; OCT 2016- Relapse of ITP- Prednisone; JAN 13th- START RITUXAN q W x4 [finished Feb 8th]; March 10th 2017- N-plate q W- good response  # Feb 2016- AUTOIMMUNE HEMOLYTIC ANEMIA; Relapsed AUG 2017- Prednisone; NOV 27th Start cellcept 500 BID.   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 Patient needing endplate very infrequently.  Patient noted to have swelling of his left elbow over the last 4-6 weeks. Denies any pain. Denies any swelling that any other sites.  Patient continues to be on CellCept 1500 mg a day. Otherwise no dizzy spells. No falls.  No syncopal episodes. Denies any epistaxis or gum bleeding. No falls/ no syncopal episodes.   REVIEW OF SYSTEMS:  A complete 10 point review of system is done which is negative except mentioned above/history of present illness.   PAST MEDICAL HISTORY :  Past Medical History:  Diagnosis Date  . Autoimmune hemolytic anemia (HCC)   . Dehydration   . Detached retina   . Hypercholesteremia   . Hypertension   . Insomnia   . 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 Leukocytosis   . Prostate cancer ([place])   . Shingles     PAST SURGICAL HISTORY :   Past Surgical History:  Procedure Laterality Date  . artificial left eye    . EYE SURGERY    . HERNIA REPAIR    . JOINT REPLACEMENT    . PROSTATE SURGERY      FAMILY HISTORY :   Family History  Problem Relation Age of Onset  . Cancer Mother   . Multiple sclerosis Father     SOCIAL HISTORY:   Social History  Substance Use Topics  . Smoking status: Never Smoker  . Smokeless tobacco: Not on file  . Alcohol use No    ALLERGIES:  is  allergic to cephalexin and hydrocodone-acetaminophen.  MEDICATIONS:  Current Outpatient Prescriptions  Medication Sig Dispense Refill  . acetaminophen (TYLENOL) 500 MG tablet Take 500 mg by mouth every 6 (six) hours as needed for mild pain. Reported on 01/24/2016    . citalopram (CELEXA) 10 MG tablet Take 10 mg by mouth daily.    . Cyanocobalamin (RA VITAMIN B-12 TR) 1000 MCG TBCR Take 1,000 mcg by mouth daily.    . feeding supplement, ENSURE ENLIVE, (ENSURE ENLIVE) LIQD Take 237 mLs by mouth 2 (two) times daily between meals. 237 mL 12  . gabapentin (NEURONTIN) 100 MG capsule Take 100 mg by mouth at bedtime. Reported on 12/03/2015    . lisinopril (PRINIVIL,ZESTRIL) 10 MG tablet Take 10 mg by mouth daily.     . mycophenolate (CELLCEPT) 500 MG tablet TAKE 2 TABLETS BY MOUTH EVERY MORNING AND 1 TABLET IN THE AFTERNOON 90 tablet 3  . omeprazole (PRILOSEC) 40 MG capsule TAKE 1 CAPSULE BY MOUTH USUALLY 30 MINUTES BEFORE BREAKFAST 90 capsule 2  . polyethylene glycol (MIRALAX / GLYCOLAX) packet Take 17 g by mouth daily as needed for mild constipation. 14 each 0  . polyvinyl alcohol (LIQUIFILM TEARS) 1.4 % ophthalmic solution Place 2 drops into the left eye 3 (three) times daily as needed for dry eyes. 15 mL 0  .  pravastatin (PRAVACHOL) 20 MG tablet Take 20 mg by mouth at bedtime.     . furosemide (LASIX) 20 MG tablet Take 20 mg by mouth daily.     No current facility-administered medications for this visit.     PHYSICAL EXAMINATION:   BP 99/61 (Patient Position: Sitting)   Pulse 76   Temp 97.6 F (36.4 C) (Tympanic)   Resp 20   Ht 5' 6.3" (1.684 m)   Wt 167 lb 6.4 oz (75.9 kg)   BMI 26.78 kg/m   Filed Weights   09/03/17 1404  Weight: 167 lb 6.4 oz (75.9 kg)    GENERAL: Well-nourished well-developed; Alert, no distress and comfortable.  Accompanied by his wife. EYES: no pallor or icterus OROPHARYNX: no thrush or ulceration; good dentition no bleeding noted. NECK: supple, no masses  felt LYMPH:  no palpable lymphadenopathy in the cervical, axillary or inguinal regions LUNGS: clear to auscultation and  No wheeze or crackles HEART/CVS: regular rate & rhythm and no murmurs; No lower extremity edema ABDOMEN:abdomen soft, non-tender and normal bowel sounds Musculoskeletal:no cyanosis of digits and no clubbing; left elbow swelling; no erythema/ tenderness.   PSYCH: alert & oriented x 3 with fluent speech NEURO: no focal motor/sensory deficits SKIN:  no rashes or significant lesions  LABORATORY DATA:  I have reviewed the data as listed    Component Value Date/Time   NA 134 (L) 02/19/2017 1354   NA 131 (L) 03/17/2015 1425   K 4.3 02/19/2017 1354   K 3.9 03/17/2015 1425   CL 103 02/19/2017 1354   CL 101 03/17/2015 1425   CO2 24 02/19/2017 1354   CO2 26 03/17/2015 1425   GLUCOSE 114 (H) 02/19/2017 1354   GLUCOSE 161 (H) 03/17/2015 1425   BUN 14 02/19/2017 1354   BUN 17 03/17/2015 1425   CREATININE 0.78 02/19/2017 1354   CREATININE 0.90 03/17/2015 1425   CALCIUM 8.8 (L) 02/19/2017 1354   CALCIUM 8.5 (L) 03/17/2015 1425   PROT 7.1 02/19/2017 1354   PROT 6.7 03/17/2015 1425   ALBUMIN 3.8 02/19/2017 1354   ALBUMIN 3.7 03/17/2015 1425   AST 26 02/19/2017 1354   AST 25 03/17/2015 1425   ALT 18 02/19/2017 1354   ALT 23 03/17/2015 1425   ALKPHOS 66 02/19/2017 1354   ALKPHOS 44 03/17/2015 1425   BILITOT 1.1 02/19/2017 1354   BILITOT 0.6 03/17/2015 1425   GFRNONAA >60 02/19/2017 1354   GFRNONAA >60 03/17/2015 1425   GFRAA >60 02/19/2017 1354   GFRAA >60 03/17/2015 1425    No results found for: SPEP, UPEP  Lab Results  Component Value Date   WBC 8.5 09/03/2017   NEUTROABS 5.9 09/03/2017   HGB 12.3 (L) 09/03/2017   HCT 35.2 (L) 09/03/2017   MCV 99.9 09/03/2017   PLT 95 (L) 09/03/2017      Chemistry      Component Value Date/Time   NA 134 (L) 02/19/2017 1354   NA 131 (L) 03/17/2015 1425   K 4.3 02/19/2017 1354   K 3.9 03/17/2015 1425   CL 103  02/19/2017 1354   CL 101 03/17/2015 1425   CO2 24 02/19/2017 1354   CO2 26 03/17/2015 1425   BUN 14 02/19/2017 1354   BUN 17 03/17/2015 1425   CREATININE 0.78 02/19/2017 1354   CREATININE 0.90 03/17/2015 1425      Component Value Date/Time   CALCIUM 8.8 (L) 02/19/2017 1354   CALCIUM 8.5 (L) 03/17/2015 1425   ALKPHOS 66 02/19/2017 1354  ALKPHOS 44 03/17/2015 1425   AST 26 02/19/2017 1354   AST 25 03/17/2015 1425   ALT 18 02/19/2017 1354   ALT 23 03/17/2015 1425   BILITOT 1.1 02/19/2017 1354   BILITOT 0.6 03/17/2015 1425       ASSESSMENT & PLAN:   Idiopathic thrombocytopenic purpura (HCC) # ITP- steroid responsive; relapsed Currently on third line therapy with N-plate; today platelets 95. Proceed with N-plate today. Discussed re; promacta- wants to hold off for now. However, interested in knowing the costs involved.   # Hemolytic anemia-LDH improved status post prednisone.  Hb 12.3/LDH- 240s-STABLE/improving. Continue Cellcept  At 1000 mg in AM; and continue 500 mg qhs.continue the same.   # Left elbow swelling- ? Bursitis. Defer to PCP re: draining. Today platelets- 95- getting N-plate today; should be okay to drain.    # Patient will have a every 2 weeks labs / CBC-LDH/ n-plate.  Follow-up with me in 3 months.  Cc; Dr.Baboff.      [name], MD 09/03/2017 2:45 PM

## 2017-09-03 NOTE — Assessment & Plan Note (Addendum)
#   ITP- steroid responsive; relapsed Currently on third line therapy with N-plate; today platelets 95. Proceed with N-plate today. Discussed re; promacta- wants to hold off for now. However, interested in knowing the costs involved.   # Hemolytic anemia-LDH improved status post prednisone.  Hb 12.3/LDH- 240s-STABLE/improving. Continue Cellcept  At 1000 mg in AM; and continue 500 mg qhs.continue the same.   # Left elbow swelling- ? Bursitis. Defer to PCP re: draining. Today platelets- 95- getting N-plate today; should be okay to drain.    # Patient will have a every 2 weeks labs / CBC-LDH/ n-plate.  Follow-up with me in 3 months.  Cc; Dr.Baboff.

## 2017-09-04 ENCOUNTER — Telehealth: Payer: Self-pay | Admitting: Pharmacist

## 2017-09-04 ENCOUNTER — Other Ambulatory Visit: Payer: Self-pay | Admitting: Internal Medicine

## 2017-09-04 DIAGNOSIS — D693 Immune thrombocytopenic purpura: Secondary | ICD-10-CM

## 2017-09-04 MED ORDER — ELTROMBOPAG OLAMINE 50 MG PO TABS: 50.0000 mg | ORAL_TABLET | Freq: Every day | ORAL | 3 refills | Status: DC

## 2017-09-04 NOTE — Telephone Encounter (Signed)
Oral Oncology Patient Advocate Encounter  Received notification from [place] that prior authorization for Promacta is required.  PA submitted on CoverMyMeds Key [id number] Status is pending  Oral Oncology Clinic will continue to follow.  [name]. [name], [place] Patient [place] [phone] 09/04/2017 1:59 PM

## 2017-09-04 NOTE — Telephone Encounter (Signed)
Oral Oncology Pharmacist Encounter  Received new prescription for Promacta (eltrombopag) for the treatment of ITP, planned duration until disease progression or unacceptable drug toxicity.  CMP from 03/22/17 ([place]) and CBC from assessed, 09/03/17. Prescription dose and frequency assessed.   Current medication list in Epic reviewed, one relevant DDIs with Promacta identified: - Mr. Obryant should take PROMACTA at least 2 hours before or 4 hours after his Ensure.   Prescription has been e-scribed to the [facility] for benefits analysis and approval.  Oral Oncology Clinic will continue to follow for insurance authorization, copayment issues, initial counseling and start date.  Darl Pikes, PharmD, BCPS Hematology/Oncology Clinical Pharmacist [facility] [phone]  09/04/2017 10:37 AM

## 2017-09-06 ENCOUNTER — Telehealth: Payer: Self-pay | Admitting: Internal Medicine

## 2017-09-06 ENCOUNTER — Other Ambulatory Visit: Payer: Self-pay | Admitting: *Deleted

## 2017-09-06 DIAGNOSIS — M7022 Olecranon bursitis, left elbow: Secondary | ICD-10-CM | POA: Insufficient documentation

## 2017-09-06 DIAGNOSIS — D693 Immune thrombocytopenic purpura: Secondary | ICD-10-CM

## 2017-09-06 NOTE — Telephone Encounter (Signed)
Oral Chemotherapy Pharmacist Encounter  Received fax stating that Mr. [name] has been approved by his insurance. Effective 09/05/17- 03/04/17.  His copay is $2,528.51 per month. I called [name] to enroll him in [place] assistance [phone]). Patient was approved.  Program ID: [id number] Group #: [id number] PCN: [id number] BIN: [id number] Processing Code: 08 Expiration Date: 11/19/2018  This copat assistance will bring the patients copay down to $0. [name].  Patient and his wife will be out out town and would like the medication delivered on 09/14/17.  Thank you,  Darl Pikes, PharmD, BCPS Hematology/Oncology Clinical Pharmacist [facility] [phone]  09/06/2017 11:34 AM

## 2017-09-06 NOTE — Telephone Encounter (Signed)
The patient able to access- Promacta 50 mg a day. We will plan to start in approximately 10 days from now/ 10/29 when he comes for the labs. Will [plan to HOLD N-plate at that time. Discussed with Aly[place].   [name]/Brook- please add CMP to labs on 1029/ & 11/26. Also add cmp to his labs when he comes to see me in jan 2019. Follow up with me as planned.

## 2017-09-06 NOTE — Telephone Encounter (Signed)
Oral Chemotherapy Pharmacist Encounter  Spoke to Dr. Rogue Bussing about the the plan to transitioning the patient to Promacta from [place]. On 10/29 patient will have his LFTs checked in addition to his normal CBC. If his labs are okay he will be contacted about starting his Promacta.  Called [name] and reviewed the plan with him. He started his understanding. Medication will be delivered to him on 09/14/17.   Thank you,  Darl Pikes, PharmD, BCPS Hematology/Oncology Clinical Pharmacist [facility] [phone]  09/06/2017 1:36 PM

## 2017-09-06 NOTE — Telephone Encounter (Signed)
[name], please cnl Nplate on 35/32/99  Labs added per md order

## 2017-09-06 NOTE — Telephone Encounter (Signed)
Oral Chemotherapy Pharmacist Encounter  I spoke with patient's wife for overview of new oral chemotherapy medication: Promacta (eltrombopag) for the treatment of ITP, planned duration until disease progression or unacceptable drug toxicity.  Counseled [name] on administration, dosing, side effects, monitoring, drug-food interactions, safe handling, storage, and disposal. Patient will take 1 tablet (50 mg total) by mouth daily. Take on an empty stomach 1 hour before a meal or 2 hours after.  Instructed [name] on how to manage DDI with Ensure. [name] should take PROMACTA at least 2 hours before or 4 hours after his Ensure.   Side effects include but not limited to: N/V/D, HA, rash, peripheral edema.    Reviewed with patient importance of keeping a medication schedule and plan for any missed doses.  [name] voiced understanding and appreciation. All questions answered.  Provided patient with [facility] phone number. Patient knows to call the office with questions or concerns. Oral Chemotherapy [facility] Clinic will continue to follow.  Thank you,  Darl Pikes, PharmD, BCPS Hematology/Oncology Clinical Pharmacist [facility] [phone]  09/06/2017 11:48 AM

## 2017-09-12 MED FILL — PROMACTA 50 MG TABLET: 50 | 30 days supply | Qty: 30 | Fill #0

## 2017-09-13 NOTE — Telephone Encounter (Signed)
Oral Oncology Patient Advocate Encounter  Patients Promacta was shipped 09/12/2017 from [facility].  [place] Patient Advocate [phone] 09/13/2017 9:53 AM

## 2017-09-17 ENCOUNTER — Inpatient Hospital Stay: Payer: PPO

## 2017-09-17 DIAGNOSIS — D693 Immune thrombocytopenic purpura: Secondary | ICD-10-CM

## 2017-09-17 LAB — COMPREHENSIVE METABOLIC PANEL
ALK PHOS: 76 U/L (ref 38–126)
ALT: 21 U/L (ref 17–63)
AST: 26 U/L (ref 15–41)
Albumin: 3.8 g/dL (ref 3.5–5.0)
Anion gap: 8 (ref 5–15)
BILIRUBIN TOTAL: 1 mg/dL (ref 0.3–1.2)
BUN: 16 mg/dL (ref 6–20)
CALCIUM: 8.8 mg/dL — AB (ref 8.9–10.3)
CO2: 24 mmol/L (ref 22–32)
CREATININE: 0.83 mg/dL (ref 0.61–1.24)
Chloride: 102 mmol/L (ref 101–111)
GFR calc Af Amer: >60 mL/min (ref >60)
GLUCOSE: 100 mg/dL — AB (ref 65–99)
POTASSIUM: 4 mmol/L (ref 3.5–5.1)
Sodium: 134 mmol/L — ABNORMAL LOW (ref 135–145)
TOTAL PROTEIN: 7 g/dL (ref 6.5–8.1)

## 2017-09-17 LAB — CBC WITH DIFFERENTIAL/PLATELET
Basophils Absolute: 0 10*3/uL (ref 0–0.1)
Basophils Relative: 1 %
EOS ABS: 0.2 10*3/uL (ref 0–0.7)
EOS PCT: 3 %
HCT: 34.9 % — ABNORMAL LOW (ref 40.0–52.0)
HEMOGLOBIN: 12.4 g/dL — AB (ref 13.0–18.0)
Lymphocytes Relative: 18 %
Lymphs Abs: 1.4 10*3/uL (ref 1.0–3.6)
MCH: 34.9 pg — AB (ref 26.0–34.0)
MCHC: 35.5 g/dL (ref 32.0–36.0)
MCV: 98.3 fL (ref 80.0–100.0)
Monocytes Absolute: 0.6 10*3/uL (ref 0.2–1.0)
Monocytes Relative: 8 %
NEUTROS PCT: 70 %
Neutro Abs: 5.8 10*3/uL (ref 1.4–6.5)
PLATELETS: 263 10*3/uL (ref 150–440)
RBC: 3.55 MIL/uL — AB (ref 4.40–5.90)
RDW: 13.1 % (ref 11.5–14.5)
WBC: 8.2 10*3/uL (ref 3.8–10.6)

## 2017-09-17 LAB — LACTATE DEHYDROGENASE: LDH: 229 U/L — ABNORMAL HIGH (ref 98–192)

## 2017-09-18 ENCOUNTER — Telehealth: Payer: Self-pay | Admitting: Internal Medicine

## 2017-09-18 NOTE — Telephone Encounter (Signed)
Please inform patient-wife:  NOT to start Promacta at this time as his platelet counts are normal at 263.   Recommend rechecking CBC in 2 weeks/please order. We'll recommend starting Promacta based on platelet count at that time. [if <100].

## 2017-09-18 NOTE — Telephone Encounter (Signed)
Spoke with patient's wife. Instructions provided to have pt hold the promacta and not to start on the medication until instructed to do so. Reviewed goals of care-Will consider starting promacta if plt count droped below 100. Pt's wife instructed to have pt keep his 2 week lab apt in Nov as scheduled. Teach back process performed with patient's wife.

## 2017-09-27 DIAGNOSIS — D693 Immune thrombocytopenic purpura: Secondary | ICD-10-CM | POA: Diagnosis not present

## 2017-09-27 DIAGNOSIS — E7800 Pure hypercholesterolemia, unspecified: Secondary | ICD-10-CM | POA: Diagnosis not present

## 2017-09-27 DIAGNOSIS — Z1231 Encounter for screening mammogram for malignant neoplasm of breast: Secondary | ICD-10-CM | POA: Diagnosis not present

## 2017-09-28 ENCOUNTER — Other Ambulatory Visit: Payer: Self-pay | Admitting: Internal Medicine

## 2017-10-01 ENCOUNTER — Inpatient Hospital Stay: Payer: PPO | Attending: Internal Medicine

## 2017-10-01 ENCOUNTER — Inpatient Hospital Stay: Payer: PPO

## 2017-10-01 DIAGNOSIS — I10 Essential (primary) hypertension: Secondary | ICD-10-CM | POA: Insufficient documentation

## 2017-10-01 DIAGNOSIS — E7800 Pure hypercholesterolemia, unspecified: Secondary | ICD-10-CM | POA: Insufficient documentation

## 2017-10-01 DIAGNOSIS — Z8546 Personal history of malignant neoplasm of prostate: Secondary | ICD-10-CM | POA: Diagnosis not present

## 2017-10-01 DIAGNOSIS — E860 Dehydration: Secondary | ICD-10-CM | POA: Insufficient documentation

## 2017-10-01 DIAGNOSIS — Z79899 Other long term (current) drug therapy: Secondary | ICD-10-CM | POA: Insufficient documentation

## 2017-10-01 DIAGNOSIS — D589 Hereditary hemolytic anemia, unspecified: Secondary | ICD-10-CM | POA: Insufficient documentation

## 2017-10-01 DIAGNOSIS — M7989 Other specified soft tissue disorders: Secondary | ICD-10-CM | POA: Diagnosis not present

## 2017-10-01 DIAGNOSIS — G4700 Insomnia, unspecified: Secondary | ICD-10-CM | POA: Diagnosis not present

## 2017-10-01 DIAGNOSIS — D693 Immune thrombocytopenic purpura: Secondary | ICD-10-CM | POA: Diagnosis not present

## 2017-10-01 LAB — CBC WITH DIFFERENTIAL/PLATELET
BASOS ABS: 0 10*3/uL (ref 0–0.1)
BASOS PCT: 1 %
Eosinophils Absolute: 0.3 10*3/uL (ref 0–0.7)
Eosinophils Relative: 4 %
HEMATOCRIT: 35.6 % — AB (ref 40.0–52.0)
HEMOGLOBIN: 12.5 g/dL — AB (ref 13.0–18.0)
LYMPHS PCT: 18 %
Lymphs Abs: 1.4 10*3/uL (ref 1.0–3.6)
MCH: 34.7 pg — ABNORMAL HIGH (ref 26.0–34.0)
MCHC: 35 g/dL (ref 32.0–36.0)
MCV: 99.1 fL (ref 80.0–100.0)
MONO ABS: 0.6 10*3/uL (ref 0.2–1.0)
MONOS PCT: 8 %
NEUTROS ABS: 5.2 10*3/uL (ref 1.4–6.5)
NEUTROS PCT: 69 %
Platelets: 71 10*3/uL — ABNORMAL LOW (ref 150–440)
RBC: 3.59 MIL/uL — ABNORMAL LOW (ref 4.40–5.90)
RDW: 13.7 % (ref 11.5–14.5)
WBC: 7.5 10*3/uL (ref 3.8–10.6)

## 2017-10-01 LAB — LACTATE DEHYDROGENASE: LDH: 239 U/L — ABNORMAL HIGH (ref 98–192)

## 2017-10-01 NOTE — Progress Notes (Signed)
Platelets are 71 today.  Patient advised to go ahead and start the [place].  Confirmed with Elesa Massed, oral chemo pharmacist, that it is OK for patient to take the Promacta and Cellcept at the same time.  Advised patient to call the office with any problems or concerns he may have with the Promacta.

## 2017-10-04 DIAGNOSIS — Z0000 Encounter for general adult medical examination without abnormal findings: Secondary | ICD-10-CM | POA: Diagnosis not present

## 2017-10-15 ENCOUNTER — Other Ambulatory Visit: Payer: Self-pay | Admitting: *Deleted

## 2017-10-15 ENCOUNTER — Inpatient Hospital Stay: Payer: PPO

## 2017-10-15 ENCOUNTER — Telehealth: Payer: Self-pay | Admitting: Internal Medicine

## 2017-10-15 DIAGNOSIS — D693 Immune thrombocytopenic purpura: Secondary | ICD-10-CM | POA: Diagnosis not present

## 2017-10-15 LAB — CBC WITH DIFFERENTIAL/PLATELET
BASOS ABS: 0.1 10*3/uL (ref 0–0.1)
BASOS PCT: 1 %
EOS ABS: 0.4 10*3/uL (ref 0–0.7)
EOS PCT: 5 %
HCT: 34.6 % — ABNORMAL LOW (ref 40.0–52.0)
Hemoglobin: 12.1 g/dL — ABNORMAL LOW (ref 13.0–18.0)
LYMPHS PCT: 17 %
Lymphs Abs: 1.4 10*3/uL (ref 1.0–3.6)
MCH: 34.4 pg — ABNORMAL HIGH (ref 26.0–34.0)
MCHC: 34.9 g/dL (ref 32.0–36.0)
MCV: 98.7 fL (ref 80.0–100.0)
Monocytes Absolute: 0.7 10*3/uL (ref 0.2–1.0)
Monocytes Relative: 9 %
Neutro Abs: 5.5 10*3/uL (ref 1.4–6.5)
Neutrophils Relative %: 68 %
PLATELETS: 646 10*3/uL — AB (ref 150–440)
RBC: 3.51 MIL/uL — AB (ref 4.40–5.90)
RDW: 13 % (ref 11.5–14.5)
WBC: 8.1 10*3/uL (ref 3.8–10.6)

## 2017-10-15 LAB — LACTATE DEHYDROGENASE: LDH: 214 U/L — AB (ref 98–192)

## 2017-10-15 NOTE — Telephone Encounter (Signed)
Discussed with wife; Recommend HOLDING promacta for now [platelets- >600]; cbc in 1 week- further instructions to follow; keep appts as planned in 2 weeks with me. Please order. Thx

## 2017-10-15 NOTE — Telephone Encounter (Signed)
[name], patient would like to come on 12/3 at 1:30 pm for his lab only apts. Also, please cnl all "Nplate" encounters as pt will no longer be receiving this medication. Keep all other lab apts as scheduled.

## 2017-10-16 DIAGNOSIS — Z08 Encounter for follow-up examination after completed treatment for malignant neoplasm: Secondary | ICD-10-CM | POA: Diagnosis not present

## 2017-10-16 DIAGNOSIS — Z8546 Personal history of malignant neoplasm of prostate: Secondary | ICD-10-CM | POA: Diagnosis not present

## 2017-10-16 DIAGNOSIS — C61 Malignant neoplasm of prostate: Secondary | ICD-10-CM | POA: Diagnosis not present

## 2017-10-22 ENCOUNTER — Telehealth: Payer: Self-pay | Admitting: Internal Medicine

## 2017-10-22 ENCOUNTER — Inpatient Hospital Stay: Payer: PPO | Attending: Internal Medicine

## 2017-10-22 DIAGNOSIS — D693 Immune thrombocytopenic purpura: Secondary | ICD-10-CM

## 2017-10-22 LAB — CBC WITH DIFFERENTIAL/PLATELET
Basophils Absolute: 0.1 10*3/uL (ref 0–0.1)
Basophils Relative: 1 %
EOS ABS: 0.5 10*3/uL (ref 0–0.7)
Eosinophils Relative: 6 %
HEMATOCRIT: 34.5 % — AB (ref 40.0–52.0)
HEMOGLOBIN: 12 g/dL — AB (ref 13.0–18.0)
LYMPHS ABS: 1.3 10*3/uL (ref 1.0–3.6)
Lymphocytes Relative: 16 %
MCH: 34.2 pg — AB (ref 26.0–34.0)
MCHC: 34.8 g/dL (ref 32.0–36.0)
MCV: 98.3 fL (ref 80.0–100.0)
MONOS PCT: 9 %
Monocytes Absolute: 0.7 10*3/uL (ref 0.2–1.0)
NEUTROS PCT: 68 %
Neutro Abs: 5.4 10*3/uL (ref 1.4–6.5)
Platelets: 395 10*3/uL (ref 150–440)
RBC: 3.51 MIL/uL — ABNORMAL LOW (ref 4.40–5.90)
RDW: 13.4 % (ref 11.5–14.5)
WBC: 7.9 10*3/uL (ref 3.8–10.6)

## 2017-10-22 LAB — LACTATE DEHYDROGENASE: LDH: 195 U/L — ABNORMAL HIGH (ref 98–192)

## 2017-10-22 MED ORDER — ELTROMBOPAG OLAMINE 25 MG PO TABS: 25.0000 mg | ORAL_TABLET | Freq: Every day | ORAL | 4 refills | Status: DC

## 2017-10-22 MED FILL — PROMACTA 25 MG TABLET: 25 | 30 days supply | Qty: 30 | Fill #0

## 2017-10-22 NOTE — Telephone Encounter (Signed)
Oral Chemotherapy Pharmacist Encounter  E-scribed Promacta dose reduced Rx to [place].   Darl Pikes, PharmD, BCPS Hematology/Oncology Clinical Pharmacist [facility] [phone]  10/22/2017 4:13 PM

## 2017-10-22 NOTE — Addendum Note (Signed)
Addended by: Darl Pikes on: 10/22/2017 04:14 PM   Modules accepted: Orders

## 2017-10-22 NOTE — Telephone Encounter (Signed)
H/B- Please inform patient/wife- that platelets are 396; plan reducing the dose of Promacta to 25 mg a day; until this new prescription is available; recommend taking Promacta 50 mg every other day.   Please order cbc in 1 week.

## 2017-10-23 NOTE — Telephone Encounter (Signed)
Oral Chemotherapy Pharmacist Encounter  Spoke with Ms. Ortwein about her husband's Promacta dose reduction. Medication will be delivered to them tomorrow 10/23/17. Per Ms. Truddie Crumble, Mr. Harmes likes to take his Antony Blackbird first thing in the morning so they plan to wait until Thursday 10/25/17 to start the Promacta 25mg .  Darl Pikes, PharmD, BCPS Hematology/Oncology Clinical Pharmacist [facility] [phone]  10/23/2017 12:24 PM

## 2017-10-24 ENCOUNTER — Telehealth: Payer: Self-pay | Admitting: Internal Medicine

## 2017-10-24 NOTE — Telephone Encounter (Signed)
Oral Oncology Patient Advocate Encounter  Patients Promacta was mailed 10/23/2017 from [facility].    [place] Patient Advocate [phone] 10/24/2017 9:00 AM

## 2017-10-29 ENCOUNTER — Inpatient Hospital Stay: Payer: PPO

## 2017-10-31 ENCOUNTER — Inpatient Hospital Stay: Payer: PPO

## 2017-10-31 ENCOUNTER — Other Ambulatory Visit: Payer: Self-pay | Admitting: Internal Medicine

## 2017-10-31 DIAGNOSIS — D693 Immune thrombocytopenic purpura: Secondary | ICD-10-CM

## 2017-10-31 LAB — COMPREHENSIVE METABOLIC PANEL
ALT: 25 U/L (ref 17–63)
ANION GAP: 5 (ref 5–15)
AST: 26 U/L (ref 15–41)
Albumin: 3.8 g/dL (ref 3.5–5.0)
Alkaline Phosphatase: 80 U/L (ref 38–126)
BILIRUBIN TOTAL: 0.7 mg/dL (ref 0.3–1.2)
BUN: 15 mg/dL (ref 6–20)
CHLORIDE: 102 mmol/L (ref 101–111)
CO2: 28 mmol/L (ref 22–32)
Calcium: 8.7 mg/dL — ABNORMAL LOW (ref 8.9–10.3)
Creatinine, Ser: 0.79 mg/dL (ref 0.61–1.24)
GFR calc Af Amer: >60 mL/min (ref >60)
Glucose, Bld: 90 mg/dL (ref 65–99)
POTASSIUM: 4.2 mmol/L (ref 3.5–5.1)
Sodium: 135 mmol/L (ref 135–145)
TOTAL PROTEIN: 7.2 g/dL (ref 6.5–8.1)

## 2017-10-31 LAB — CBC WITH DIFFERENTIAL/PLATELET
BASOS ABS: 0 10*3/uL (ref 0–0.1)
Basophils Relative: 1 %
Eosinophils Absolute: 0.4 10*3/uL (ref 0–0.7)
Eosinophils Relative: 5 %
HEMATOCRIT: 37 % — AB (ref 40.0–52.0)
HEMOGLOBIN: 12.8 g/dL — AB (ref 13.0–18.0)
LYMPHS ABS: 1.6 10*3/uL (ref 1.0–3.6)
LYMPHS PCT: 18 %
MCH: 33.8 pg (ref 26.0–34.0)
MCHC: 34.6 g/dL (ref 32.0–36.0)
MCV: 97.9 fL (ref 80.0–100.0)
Monocytes Absolute: 0.7 10*3/uL (ref 0.2–1.0)
Monocytes Relative: 7 %
NEUTROS ABS: 6.1 10*3/uL (ref 1.4–6.5)
NEUTROS PCT: 69 %
PLATELETS: 179 10*3/uL (ref 150–440)
RBC: 3.78 MIL/uL — AB (ref 4.40–5.90)
RDW: 13.3 % (ref 11.5–14.5)
WBC: 8.8 10*3/uL (ref 3.8–10.6)

## 2017-10-31 LAB — LACTATE DEHYDROGENASE: LDH: 210 U/L — ABNORMAL HIGH (ref 98–192)

## 2017-10-31 NOTE — Progress Notes (Signed)
[name]/Brooke-  Platelets- 179; please confirm with pt/wife that he is taking promacta 25 mg/day; if she is taking he should continue the same. Continue labs as planned. Dr.B

## 2017-11-01 NOTE — Progress Notes (Signed)
Patient is currently on Promacta 25 mg daily. Spoke with patient's wife to confirm dosing. She was instructed to inform pt to continue taking the same dose of the Promacta and keep apts as scheduled. Teach back prcoess performed.

## 2017-11-02 DIAGNOSIS — L853 Xerosis cutis: Secondary | ICD-10-CM | POA: Diagnosis not present

## 2017-11-02 DIAGNOSIS — L570 Actinic keratosis: Secondary | ICD-10-CM | POA: Diagnosis not present

## 2017-11-02 DIAGNOSIS — X32XXXA Exposure to sunlight, initial encounter: Secondary | ICD-10-CM | POA: Diagnosis not present

## 2017-11-02 DIAGNOSIS — L821 Other seborrheic keratosis: Secondary | ICD-10-CM | POA: Diagnosis not present

## 2017-11-12 ENCOUNTER — Inpatient Hospital Stay: Payer: PPO

## 2017-11-14 ENCOUNTER — Inpatient Hospital Stay: Payer: PPO

## 2017-11-14 ENCOUNTER — Ambulatory Visit: Payer: PPO

## 2017-11-14 DIAGNOSIS — D693 Immune thrombocytopenic purpura: Secondary | ICD-10-CM | POA: Diagnosis not present

## 2017-11-14 LAB — CBC WITH DIFFERENTIAL/PLATELET
BASOS PCT: 1 %
Basophils Absolute: 0.1 10*3/uL (ref 0–0.1)
EOS ABS: 0.6 10*3/uL (ref 0–0.7)
Eosinophils Relative: 7 %
HCT: 36.1 % — ABNORMAL LOW (ref 40.0–52.0)
HEMOGLOBIN: 12.5 g/dL — AB (ref 13.0–18.0)
Lymphocytes Relative: 19 %
Lymphs Abs: 1.5 10*3/uL (ref 1.0–3.6)
MCH: 34.1 pg — ABNORMAL HIGH (ref 26.0–34.0)
MCHC: 34.7 g/dL (ref 32.0–36.0)
MCV: 98.3 fL (ref 80.0–100.0)
MONOS PCT: 7 %
Monocytes Absolute: 0.6 10*3/uL (ref 0.2–1.0)
NEUTROS PCT: 66 %
Neutro Abs: 5.1 10*3/uL (ref 1.4–6.5)
Platelets: 347 10*3/uL (ref 150–440)
RBC: 3.67 MIL/uL — AB (ref 4.40–5.90)
RDW: 13.6 % (ref 11.5–14.5)
WBC: 7.8 10*3/uL (ref 3.8–10.6)

## 2017-11-14 LAB — LACTATE DEHYDROGENASE: LDH: 196 U/L — AB (ref 98–192)

## 2017-11-14 MED FILL — PROMACTA 25 MG TABLET: 25 | 30 days supply | Qty: 30 | Fill #1

## 2017-11-26 ENCOUNTER — Inpatient Hospital Stay: Payer: PPO | Attending: Internal Medicine | Admitting: Internal Medicine

## 2017-11-26 ENCOUNTER — Ambulatory Visit: Payer: PPO

## 2017-11-26 ENCOUNTER — Encounter: Payer: Self-pay | Admitting: Internal Medicine

## 2017-11-26 ENCOUNTER — Inpatient Hospital Stay: Payer: PPO

## 2017-11-26 VITALS — BP 95/61 | HR 75 | Temp 97.1°F | Resp 16 | Wt 168.0 lb

## 2017-11-26 DIAGNOSIS — E7800 Pure hypercholesterolemia, unspecified: Secondary | ICD-10-CM | POA: Diagnosis not present

## 2017-11-26 DIAGNOSIS — D693 Immune thrombocytopenic purpura: Secondary | ICD-10-CM | POA: Diagnosis not present

## 2017-11-26 DIAGNOSIS — Z79899 Other long term (current) drug therapy: Secondary | ICD-10-CM | POA: Diagnosis not present

## 2017-11-26 DIAGNOSIS — D589 Hereditary hemolytic anemia, unspecified: Secondary | ICD-10-CM | POA: Insufficient documentation

## 2017-11-26 DIAGNOSIS — M7989 Other specified soft tissue disorders: Secondary | ICD-10-CM | POA: Insufficient documentation

## 2017-11-26 DIAGNOSIS — Z8546 Personal history of malignant neoplasm of prostate: Secondary | ICD-10-CM

## 2017-11-26 DIAGNOSIS — G4700 Insomnia, unspecified: Secondary | ICD-10-CM | POA: Insufficient documentation

## 2017-11-26 DIAGNOSIS — I10 Essential (primary) hypertension: Secondary | ICD-10-CM | POA: Diagnosis not present

## 2017-11-26 LAB — CBC WITH DIFFERENTIAL/PLATELET
BASOS PCT: 1 %
Basophils Absolute: 0.1 10*3/uL (ref 0–0.1)
Eosinophils Absolute: 0.4 10*3/uL (ref 0–0.7)
Eosinophils Relative: 6 %
HEMATOCRIT: 36.7 % — AB (ref 40.0–52.0)
Hemoglobin: 13.1 g/dL (ref 13.0–18.0)
LYMPHS ABS: 1.1 10*3/uL (ref 1.0–3.6)
Lymphocytes Relative: 16 %
MCH: 34.7 pg — ABNORMAL HIGH (ref 26.0–34.0)
MCHC: 35.6 g/dL (ref 32.0–36.0)
MCV: 97.6 fL (ref 80.0–100.0)
MONO ABS: 0.5 10*3/uL (ref 0.2–1.0)
MONOS PCT: 7 %
NEUTROS ABS: 5.1 10*3/uL (ref 1.4–6.5)
Neutrophils Relative %: 70 %
Platelets: 316 10*3/uL (ref 150–440)
RBC: 3.76 MIL/uL — ABNORMAL LOW (ref 4.40–5.90)
RDW: 13.4 % (ref 11.5–14.5)
WBC: 7.2 10*3/uL (ref 3.8–10.6)

## 2017-11-26 LAB — LACTATE DEHYDROGENASE: LDH: 203 U/L — ABNORMAL HIGH (ref 98–192)

## 2017-11-26 NOTE — Assessment & Plan Note (Addendum)
#   ITP- steroid responsive; relapsed Currently on promacta 25mg  /day;  today platelets - 316; continue current dose of Promacta.  # Hemolytic anemia-  Hb 13.5/LDH- 200s-STABLE/improving. Continue Cellcept  At 1000 mg in AM; and continue 500 mg qhs.continue the same.   # Left elbow swelling-s/p bursitis- improved.    # cbc/ldh monthly- follow p in 4 months.   cc; Dr.Baboff.

## 2017-11-26 NOTE — Progress Notes (Signed)
[place] OFFICE PROGRESS NOTE  Patient Care Team: Derinda Late, MD as PCP - General (Family Medicine)   SUMMARY OF ONCOLOGIC HISTORY:  #FEB 2016-  ITP  s/p Prednisone; OCT 2016- Relapse of ITP- Prednisone; JAN 13th- START RITUXAN q W x4 [finished Feb 8th]; March 10th 2017- N-plate q W- good response; Dec 2018- promacta 25 mg/day.   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   # Hx of nose bleeds  INTERVAL HISTORY:  A very pleasant 84 year-old male patient with above history of autoimmune hemolytic anemia along with ITP-  Currently on promacta 25 mg a day.  Patient continues to be on CellCept 1500 mg a day. Otherwise no dizzy spells. No falls.  No syncopal episodes. Denies any epistaxis or gum bleeding. No falls/ no syncopal episodes.   REVIEW OF SYSTEMS:  A complete 10 point review of system is done which is negative except mentioned above/history of present illness.   PAST MEDICAL HISTORY :  Past Medical History:  Diagnosis Date  . Autoimmune hemolytic anemia (HCC)   . Dehydration   . Detached retina   . Hypercholesteremia   . Hypertension   . Insomnia   . 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 Leukocytosis   . Prostate cancer ([place])   . Shingles     PAST SURGICAL HISTORY :   Past Surgical History:  Procedure Laterality Date  . artificial left eye    . EYE SURGERY    . HERNIA REPAIR    . JOINT REPLACEMENT    . PROSTATE SURGERY      FAMILY HISTORY :   Family History  Problem Relation Age of Onset  . Cancer Mother   . Multiple sclerosis Father     SOCIAL HISTORY:   Social History   Tobacco Use  . Smoking status: Never Smoker  Substance Use Topics  . Alcohol use: No  . Drug use: No    ALLERGIES:  is allergic to cephalexin and hydrocodone-acetaminophen.  MEDICATIONS:  Current Outpatient Medications  Medication Sig Dispense Refill  . acetaminophen  (TYLENOL) 500 MG tablet Take 500 mg by mouth every 6 (six) hours as needed for mild pain. Reported on 01/24/2016    . citalopram (CELEXA) 10 MG tablet Take 10 mg by mouth daily.    . Cyanocobalamin (RA VITAMIN B-12 TR) 1000 MCG TBCR Take 1,000 mcg by mouth daily.    [place] eltrombopag (PROMACTA) 25 MG tablet Take 1 tablet (25 mg total) by mouth daily. Take on an empty stomach 1 hour before a meal or 2 hours after 30 tablet 4  . feeding supplement, ENSURE ENLIVE, (ENSURE ENLIVE) LIQD Take 237 mLs by mouth 2 (two) times daily between meals. 237 mL 12  . gabapentin (NEURONTIN) 100 MG capsule Take 100 mg by mouth at bedtime. Reported on 12/03/2015    . lisinopril (PRINIVIL,ZESTRIL) 10 MG tablet Take 10 mg by mouth daily.     . mycophenolate (CELLCEPT) 500 MG tablet TAKE 2 TABLETS BY MOUTH EVERY MORNING AND 1 TABLET IN THE AFTERNOON 90 tablet 3  . omeprazole (PRILOSEC) 40 MG capsule TAKE 1 CAPSULE BY MOUTH DAILY USUALLY 30 MINUTES BEFORE BREAKFAST 90 capsule 2  . polyethylene glycol (MIRALAX / GLYCOLAX) packet Take 17 g by mouth daily as needed for mild constipation. 14 each 0  . polyvinyl alcohol (LIQUIFILM TEARS) 1.4 % ophthalmic solution Place 2 drops into the left eye 3 (three)  times daily as needed for dry eyes. 15 mL 0  . pravastatin (PRAVACHOL) 20 MG tablet Take 20 mg by mouth at bedtime.      No current facility-administered medications for this visit.     PHYSICAL EXAMINATION:   BP 95/61 (BP Location: Left Arm, Patient Position: Sitting)   Pulse 75   Temp (!) 97.1 F (36.2 C) (Tympanic)   Resp 16   Wt 168 lb (76.2 kg)   BMI 26.87 kg/m   Filed Weights   11/26/17 1155  Weight: 168 lb (76.2 kg)    GENERAL: Well-nourished well-developed; Alert, no distress and comfortable.  Accompanied by his wife. EYES: no pallor or icterus OROPHARYNX: no thrush or ulceration; good dentition no bleeding noted. NECK: supple, no masses felt LYMPH:  no palpable lymphadenopathy in the cervical, axillary or  inguinal regions LUNGS: clear to auscultation and  No wheeze or crackles HEART/CVS: regular rate & rhythm and no murmurs; No lower extremity edema ABDOMEN:abdomen soft, non-tender and normal bowel sounds Musculoskeletal:no cyanosis of digits and no clubbing; left elbow swelling; no erythema/ tenderness.   PSYCH: alert & oriented x 3 with fluent speech NEURO: no focal motor/sensory deficits SKIN:  no rashes or significant lesions  LABORATORY DATA:  I have reviewed the data as listed    Component Value Date/Time   NA 135 10/31/2017 1145   NA 131 (L) 03/17/2015 1425   K 4.2 10/31/2017 1145   K 3.9 03/17/2015 1425   CL 102 10/31/2017 1145   CL 101 03/17/2015 1425   CO2 28 10/31/2017 1145   CO2 26 03/17/2015 1425   GLUCOSE 90 10/31/2017 1145   GLUCOSE 161 (H) 03/17/2015 1425   BUN 15 10/31/2017 1145   BUN 17 03/17/2015 1425   CREATININE 0.79 10/31/2017 1145   CREATININE 0.90 03/17/2015 1425   CALCIUM 8.7 (L) 10/31/2017 1145   CALCIUM 8.5 (L) 03/17/2015 1425   PROT 7.2 10/31/2017 1145   PROT 6.7 03/17/2015 1425   ALBUMIN 3.8 10/31/2017 1145   ALBUMIN 3.7 03/17/2015 1425   AST 26 10/31/2017 1145   AST 25 03/17/2015 1425   ALT 25 10/31/2017 1145   ALT 23 03/17/2015 1425   ALKPHOS 80 10/31/2017 1145   ALKPHOS 44 03/17/2015 1425   BILITOT 0.7 10/31/2017 1145   BILITOT 0.6 03/17/2015 1425   GFRNONAA >60 10/31/2017 1145   GFRNONAA >60 03/17/2015 1425   GFRAA >60 10/31/2017 1145   GFRAA >60 03/17/2015 1425    No results found for: SPEP, UPEP  Lab Results  Component Value Date   WBC 7.2 11/26/2017   NEUTROABS 5.1 11/26/2017   HGB 13.1 11/26/2017   HCT 36.7 (L) 11/26/2017   MCV 97.6 11/26/2017   PLT 316 11/26/2017      Chemistry      Component Value Date/Time   NA 135 10/31/2017 1145   NA 131 (L) 03/17/2015 1425   K 4.2 10/31/2017 1145   K 3.9 03/17/2015 1425   CL 102 10/31/2017 1145   CL 101 03/17/2015 1425   CO2 28 10/31/2017 1145   CO2 26 03/17/2015 1425    BUN 15 10/31/2017 1145   BUN 17 03/17/2015 1425   CREATININE 0.79 10/31/2017 1145   CREATININE 0.90 03/17/2015 1425      Component Value Date/Time   CALCIUM 8.7 (L) 10/31/2017 1145   CALCIUM 8.5 (L) 03/17/2015 1425   ALKPHOS 80 10/31/2017 1145   ALKPHOS 44 03/17/2015 1425   AST 26 10/31/2017 1145  AST 25 03/17/2015 1425   ALT 25 10/31/2017 1145   ALT 23 03/17/2015 1425   BILITOT 0.7 10/31/2017 1145   BILITOT 0.6 03/17/2015 1425       ASSESSMENT & PLAN:   Idiopathic thrombocytopenic purpura ([place]) # ITP- steroid responsive; relapsed Currently on promacta 25mg  /day;  today platelets - 316; continue current dose of Promacta.  # Hemolytic anemia-  Hb 13.5/LDH- 200s-STABLE/improving. Continue Cellcept  At 1000 mg in AM; and continue 500 mg qhs.continue the same.   # Left elbow swelling-s/p bursitis- improved.    # cbc/ldh monthly- follow p in 4 months.   cc; Dr.Baboff.      [name], MD 11/26/2017 2:19 PM

## 2017-11-28 ENCOUNTER — Telehealth: Payer: Self-pay | Admitting: Pharmacist

## 2017-11-28 NOTE — Telephone Encounter (Signed)
Oral Chemotherapy Pharmacist Encounter  Follow-Up Form  Called patient today to follow up regarding patient's oral chemotherapy medication: Promacta  Original Start date of oral chemotherapy: 10/2017  Pt reports 0 tablets/doses of Promacta missed in the last month. Reviewed plan for missed doses.   Pt reports the following side effects: occasional diarrhea which he is able to manage.  Recent labs reviewed: CBC from 11/26/17  New medications?: no new medication reported  Other Issues: N/A  Patient knows to call the office with questions or concerns. Oral Oncology Clinic will continue to follow.  Darl Pikes, PharmD, BCPS Hematology/Oncology Clinical Pharmacist [facility] [place] [facility] [phone]  11/28/2017 11:51 AM

## 2017-11-29 ENCOUNTER — Telehealth: Payer: Self-pay | Admitting: Internal Medicine

## 2017-11-29 NOTE — Telephone Encounter (Signed)
Oral Oncology Patient Advocate Encounter  Patients funds are running out on 12/04/2017 with [employer] [place] and wanted [place] to see if we could get funds thru [place]. I applied and was denied due to his income is more than 100,000.00  Mr. Lezotte has some meds on hand and will take them.   He is going to try to change his income to make it $90,000.00 so we can get him help. I told him that was fine but I have to show paper work to support that. He will let me know.    [place] Patient Advocate [phone] 11/29/2017 11:53 AM

## 2017-12-03 MED FILL — PROMACTA 25 MG TABLET: 25 | 30 days supply | Qty: 30 | Fill #2

## 2017-12-13 DIAGNOSIS — H40051 Ocular hypertension, right eye: Secondary | ICD-10-CM | POA: Diagnosis not present

## 2017-12-24 ENCOUNTER — Inpatient Hospital Stay: Payer: PPO | Attending: Internal Medicine

## 2017-12-24 DIAGNOSIS — M7989 Other specified soft tissue disorders: Secondary | ICD-10-CM | POA: Diagnosis not present

## 2017-12-24 DIAGNOSIS — Z8546 Personal history of malignant neoplasm of prostate: Secondary | ICD-10-CM | POA: Diagnosis not present

## 2017-12-24 DIAGNOSIS — D589 Hereditary hemolytic anemia, unspecified: Secondary | ICD-10-CM | POA: Insufficient documentation

## 2017-12-24 DIAGNOSIS — Z79899 Other long term (current) drug therapy: Secondary | ICD-10-CM | POA: Diagnosis not present

## 2017-12-24 DIAGNOSIS — D693 Immune thrombocytopenic purpura: Secondary | ICD-10-CM | POA: Diagnosis not present

## 2017-12-24 DIAGNOSIS — G4700 Insomnia, unspecified: Secondary | ICD-10-CM | POA: Diagnosis not present

## 2017-12-24 DIAGNOSIS — E7800 Pure hypercholesterolemia, unspecified: Secondary | ICD-10-CM | POA: Insufficient documentation

## 2017-12-24 DIAGNOSIS — I10 Essential (primary) hypertension: Secondary | ICD-10-CM | POA: Insufficient documentation

## 2017-12-24 LAB — COMPREHENSIVE METABOLIC PANEL
ALBUMIN: 3.8 g/dL (ref 3.5–5.0)
ALK PHOS: 69 U/L (ref 38–126)
ALT: 23 U/L (ref 17–63)
AST: 28 U/L (ref 15–41)
Anion gap: 5 (ref 5–15)
BILIRUBIN TOTAL: 0.5 mg/dL (ref 0.3–1.2)
BUN: 18 mg/dL (ref 6–20)
CO2: 29 mmol/L (ref 22–32)
Calcium: 8.8 mg/dL — ABNORMAL LOW (ref 8.9–10.3)
Chloride: 102 mmol/L (ref 101–111)
Creatinine, Ser: 0.8 mg/dL (ref 0.61–1.24)
GFR calc Af Amer: >60 mL/min (ref >60)
GFR calc non Af Amer: >60 mL/min (ref >60)
GLUCOSE: 87 mg/dL (ref 65–99)
POTASSIUM: 4.2 mmol/L (ref 3.5–5.1)
Sodium: 136 mmol/L (ref 135–145)
TOTAL PROTEIN: 7.1 g/dL (ref 6.5–8.1)

## 2017-12-24 LAB — CBC WITH DIFFERENTIAL/PLATELET
BASOS ABS: 0 10*3/uL (ref 0–0.1)
Basophils Relative: 1 %
Eosinophils Absolute: 0.3 10*3/uL (ref 0–0.7)
Eosinophils Relative: 4 %
HCT: 37.1 % — ABNORMAL LOW (ref 40.0–52.0)
Hemoglobin: 12.7 g/dL — ABNORMAL LOW (ref 13.0–18.0)
LYMPHS ABS: 1.5 10*3/uL (ref 1.0–3.6)
LYMPHS PCT: 21 %
MCH: 33.5 pg (ref 26.0–34.0)
MCHC: 34.2 g/dL (ref 32.0–36.0)
MCV: 97.9 fL (ref 80.0–100.0)
MONO ABS: 0.7 10*3/uL (ref 0.2–1.0)
MONOS PCT: 9 %
Neutro Abs: 4.8 10*3/uL (ref 1.4–6.5)
Neutrophils Relative %: 65 %
PLATELETS: 310 10*3/uL (ref 150–440)
RBC: 3.79 MIL/uL — ABNORMAL LOW (ref 4.40–5.90)
RDW: 13.3 % (ref 11.5–14.5)
WBC: 7.3 10*3/uL (ref 3.8–10.6)

## 2017-12-24 LAB — LACTATE DEHYDROGENASE: LDH: 190 U/L (ref 98–192)

## 2017-12-25 ENCOUNTER — Other Ambulatory Visit: Payer: Self-pay | Admitting: *Deleted

## 2017-12-25 ENCOUNTER — Telehealth: Payer: Self-pay | Admitting: Internal Medicine

## 2017-12-25 DIAGNOSIS — D693 Immune thrombocytopenic purpura: Secondary | ICD-10-CM

## 2017-12-25 NOTE — Telephone Encounter (Signed)
Oral Oncology Patient Advocate Encounter   Called the assistant fund because we got a denial on the copay for the patients Promacta. I was told the fund was closed on 12/04/2017 due to not enough funding. Tamika said the patient had been notified to go to [place]. I tried [employer] but due to income they would not approve.  Left a message for the patient to call me so I can go over this information with him.    [place] Patient Advocate [phone] 12/25/2017 2:14 PM

## 2017-12-26 MED ORDER — MYCOPHENOLATE MOFETIL 500 MG PO TABS: ORAL_TABLET | ORAL | 3 refills | Status: DC

## 2017-12-26 MED FILL — PROMACTA 25 MG TABLET: 25 | 30 days supply | Qty: 30 | Fill #3

## 2017-12-27 ENCOUNTER — Other Ambulatory Visit: Payer: PPO

## 2018-01-16 DIAGNOSIS — R55 Syncope and collapse: Secondary | ICD-10-CM | POA: Diagnosis not present

## 2018-01-16 DIAGNOSIS — E7800 Pure hypercholesterolemia, unspecified: Secondary | ICD-10-CM | POA: Diagnosis not present

## 2018-01-16 DIAGNOSIS — D693 Immune thrombocytopenic purpura: Secondary | ICD-10-CM | POA: Diagnosis not present

## 2018-01-16 DIAGNOSIS — I10 Essential (primary) hypertension: Secondary | ICD-10-CM | POA: Diagnosis not present

## 2018-01-21 ENCOUNTER — Telehealth: Payer: Self-pay | Admitting: Pharmacist

## 2018-01-21 ENCOUNTER — Inpatient Hospital Stay: Payer: PPO | Attending: Internal Medicine

## 2018-01-21 DIAGNOSIS — D693 Immune thrombocytopenic purpura: Secondary | ICD-10-CM | POA: Diagnosis not present

## 2018-01-21 DIAGNOSIS — D589 Hereditary hemolytic anemia, unspecified: Secondary | ICD-10-CM | POA: Insufficient documentation

## 2018-01-21 DIAGNOSIS — Z8546 Personal history of malignant neoplasm of prostate: Secondary | ICD-10-CM | POA: Insufficient documentation

## 2018-01-21 DIAGNOSIS — Z79899 Other long term (current) drug therapy: Secondary | ICD-10-CM | POA: Insufficient documentation

## 2018-01-21 LAB — COMPREHENSIVE METABOLIC PANEL
ALT: 25 U/L (ref 17–63)
AST: 27 U/L (ref 15–41)
Albumin: 3.7 g/dL (ref 3.5–5.0)
Alkaline Phosphatase: 63 U/L (ref 38–126)
Anion gap: 5 (ref 5–15)
BUN: 18 mg/dL (ref 6–20)
CHLORIDE: 104 mmol/L (ref 101–111)
CO2: 26 mmol/L (ref 22–32)
Calcium: 8.7 mg/dL — ABNORMAL LOW (ref 8.9–10.3)
Creatinine, Ser: 0.83 mg/dL (ref 0.61–1.24)
Glucose, Bld: 94 mg/dL (ref 65–99)
POTASSIUM: 4 mmol/L (ref 3.5–5.1)
SODIUM: 135 mmol/L (ref 135–145)
Total Bilirubin: 0.7 mg/dL (ref 0.3–1.2)
Total Protein: 7 g/dL (ref 6.5–8.1)

## 2018-01-21 LAB — CBC WITH DIFFERENTIAL/PLATELET
Basophils Absolute: 0 10*3/uL (ref 0–0.1)
Basophils Relative: 1 %
Eosinophils Absolute: 0.3 10*3/uL (ref 0–0.7)
Eosinophils Relative: 4 %
HEMATOCRIT: 37.1 % — AB (ref 40.0–52.0)
HEMOGLOBIN: 12.8 g/dL — AB (ref 13.0–18.0)
LYMPHS ABS: 1.4 10*3/uL (ref 1.0–3.6)
LYMPHS PCT: 19 %
MCH: 33.8 pg (ref 26.0–34.0)
MCHC: 34.4 g/dL (ref 32.0–36.0)
MCV: 98.4 fL (ref 80.0–100.0)
Monocytes Absolute: 0.5 10*3/uL (ref 0.2–1.0)
Monocytes Relative: 8 %
NEUTROS PCT: 68 %
Neutro Abs: 4.8 10*3/uL (ref 1.4–6.5)
Platelets: 333 10*3/uL (ref 150–440)
RBC: 3.77 MIL/uL — AB (ref 4.40–5.90)
RDW: 13.4 % (ref 11.5–14.5)
WBC: 7.1 10*3/uL (ref 3.8–10.6)

## 2018-01-21 LAB — LACTATE DEHYDROGENASE: LDH: 180 U/L (ref 98–192)

## 2018-01-21 NOTE — Telephone Encounter (Signed)
Oral Chemotherapy Pharmacist Encounter   Mrs. Cowley requested to speak with me while in clinic today for Mr. Manke lab appt. Due to a high copay for Mr. Anacleto Batterman 25mg  tablets, she wanted to know if it would be possible for them to use some of his old 50mg  tablets and just take them less often.  I spoke with Dr. Rogue Bussing, he is okay with Mr. Bearse using his 50mg  tablets on Monday, Wednesday, and Friday. I relayed this information to Mr. and Mrs. Ginsberg, they stated their understanding and they decided to hold off on using the 50mg  tablets for now. I asked them to let me know if they do start using the 50mg  tablets.   Darl Pikes, PharmD, BCPS Hematology/Oncology Clinical Pharmacist [facility] [phone]  01/21/2018 4:14 PM

## 2018-01-23 ENCOUNTER — Telehealth: Payer: Self-pay | Admitting: Internal Medicine

## 2018-01-23 NOTE — Telephone Encounter (Signed)
Oral Oncology Patient Advocate Encounter  Applied for funds thru [employer] patient was denied due to income to high.    [place] Patient Advocate [phone] 01/23/2018 8:18 AM

## 2018-01-24 ENCOUNTER — Other Ambulatory Visit: Payer: PPO

## 2018-02-18 ENCOUNTER — Telehealth: Payer: Self-pay | Admitting: Internal Medicine

## 2018-02-18 ENCOUNTER — Inpatient Hospital Stay: Payer: PPO | Attending: Internal Medicine

## 2018-02-18 DIAGNOSIS — Z79899 Other long term (current) drug therapy: Secondary | ICD-10-CM | POA: Insufficient documentation

## 2018-02-18 DIAGNOSIS — Z8546 Personal history of malignant neoplasm of prostate: Secondary | ICD-10-CM | POA: Diagnosis not present

## 2018-02-18 DIAGNOSIS — D693 Immune thrombocytopenic purpura: Secondary | ICD-10-CM | POA: Diagnosis not present

## 2018-02-18 DIAGNOSIS — D589 Hereditary hemolytic anemia, unspecified: Secondary | ICD-10-CM | POA: Diagnosis not present

## 2018-02-18 LAB — CBC WITH DIFFERENTIAL/PLATELET
BASOS PCT: 1 %
Basophils Absolute: 0.1 10*3/uL (ref 0–0.1)
EOS ABS: 0.4 10*3/uL (ref 0–0.7)
Eosinophils Relative: 5 %
HCT: 37.3 % — ABNORMAL LOW (ref 40.0–52.0)
HEMOGLOBIN: 13.1 g/dL (ref 13.0–18.0)
LYMPHS ABS: 1.6 10*3/uL (ref 1.0–3.6)
Lymphocytes Relative: 21 %
MCH: 34.2 pg — AB (ref 26.0–34.0)
MCHC: 35.2 g/dL (ref 32.0–36.0)
MCV: 97.2 fL (ref 80.0–100.0)
MONOS PCT: 8 %
Monocytes Absolute: 0.6 10*3/uL (ref 0.2–1.0)
NEUTROS PCT: 65 %
Neutro Abs: 5.1 10*3/uL (ref 1.4–6.5)
PLATELETS: 331 10*3/uL (ref 150–440)
RBC: 3.83 MIL/uL — ABNORMAL LOW (ref 4.40–5.90)
RDW: 13.2 % (ref 11.5–14.5)
WBC: 7.8 10*3/uL (ref 3.8–10.6)

## 2018-02-18 LAB — LACTATE DEHYDROGENASE: LDH: 177 U/L (ref 98–192)

## 2018-02-18 MED FILL — PROMACTA 25 MG TABLET: 25 | 30 days supply | Qty: 30 | Fill #4

## 2018-02-18 NOTE — Telephone Encounter (Signed)
Oral Oncology Patient Advocate Encounter  Patients wife called today to let me know they had not gotten the Promacta yet. I had spoke to [facility] on 02/14/2018 and given them his CC#. All grants are used up.   Called Hilliard Clark today to see why it was no mailed out. He was not sure but he will makes sure it goes out today. Called Mrs. [place] back to let her know they will receive Tuesday.   [place] Patient Advocate [phone] 02/18/2018 2:24 PM

## 2018-02-19 ENCOUNTER — Telehealth: Payer: Self-pay | Admitting: Pharmacist

## 2018-02-19 NOTE — Telephone Encounter (Signed)
Oral Chemotherapy Pharmacist Encounter  Follow-Up Form  Called patient today to follow up regarding patient's oral chemotherapy medication: Promacta  Original Start date of oral chemotherapy: 10/2017  Pt reports 0 tablets/doses of Promacta missed in the last month.   Pt reports the following side effects: none, reported  Recent labs reviewed: CBC from 02/18/18  New medications?: none, reported  Other Issues: none, reported  Patient knows to call the office with questions or concerns. Oral Oncology Clinic will continue to follow.  Darl Pikes, PharmD, BCPS Hematology/Oncology Clinical Pharmacist [facility] [phone]  02/19/2018 2:29 PM

## 2018-02-21 ENCOUNTER — Other Ambulatory Visit: Payer: PPO

## 2018-03-12 ENCOUNTER — Other Ambulatory Visit: Payer: Self-pay | Admitting: Internal Medicine

## 2018-03-12 DIAGNOSIS — D693 Immune thrombocytopenic purpura: Secondary | ICD-10-CM

## 2018-03-19 ENCOUNTER — Telehealth: Payer: Self-pay | Admitting: Pharmacy Technician

## 2018-03-19 ENCOUNTER — Telehealth: Payer: Self-pay | Admitting: Pharmacist

## 2018-03-19 MED FILL — PROMACTA 25 MG TABLET: 25 | 30 days supply | Qty: 30 | Fill #0

## 2018-03-19 NOTE — Telephone Encounter (Signed)
Oral Oncology Patient Advocate Encounter  Received notification from [place] that the existing prior authorization for Promacta is in need of renewal.  An urgent PA renewal request was submitted via phone Reference number [id number] Status is pending  [facility] will continue to follow.  Fabio Asa. [name], [place] Patient [place] [phone] 03/19/2018 9:29 AM

## 2018-03-19 NOTE — Telephone Encounter (Signed)
Oral Chemotherapy Pharmacist Encounter  Successfully enrolled patient for copayment assistance funds from [employer] [place] from the Thrombocytopenia fund.  Award amount: unlimited, patient will pay $10 copay Effective dates: 07/06/2017 - 11/19/18 ID: [id number] BIN: [id number] Group: [id number] PCN: [id number]  Billing information will be shared with [place]. I will place a copy of the award letter to be scanned into patient's chart.  Darl Pikes, PharmD, BCPS Hematology/Oncology Clinical Pharmacist [facility] [phone]  03/19/2018 1:34 PM

## 2018-03-22 NOTE — Telephone Encounter (Signed)
Oral Oncology Pharmacist Encounter  Received notification from [place] that [facility] prescription is now processing. This means that the urgent PA (Reference number [id number]) was approved.  Darl Pikes, PharmD, BCPS Hematology/Oncology Clinical Pharmacist [facility] [phone]  03/22/2018 8:20 AM

## 2018-03-25 ENCOUNTER — Inpatient Hospital Stay: Payer: PPO | Attending: Internal Medicine

## 2018-03-25 ENCOUNTER — Inpatient Hospital Stay (HOSPITAL_BASED_OUTPATIENT_CLINIC_OR_DEPARTMENT_OTHER): Payer: PPO | Admitting: Internal Medicine

## 2018-03-25 VITALS — BP 117/72 | HR 64 | Temp 97.5°F | Resp 16 | Wt 170.6 lb

## 2018-03-25 DIAGNOSIS — D693 Immune thrombocytopenic purpura: Secondary | ICD-10-CM | POA: Insufficient documentation

## 2018-03-25 DIAGNOSIS — D589 Hereditary hemolytic anemia, unspecified: Secondary | ICD-10-CM

## 2018-03-25 DIAGNOSIS — Z8546 Personal history of malignant neoplasm of prostate: Secondary | ICD-10-CM | POA: Diagnosis not present

## 2018-03-25 DIAGNOSIS — Z79899 Other long term (current) drug therapy: Secondary | ICD-10-CM | POA: Diagnosis not present

## 2018-03-25 LAB — COMPREHENSIVE METABOLIC PANEL
ALT: 19 U/L (ref 17–63)
ANION GAP: 7 (ref 5–15)
AST: 24 U/L (ref 15–41)
Albumin: 3.6 g/dL (ref 3.5–5.0)
Alkaline Phosphatase: 58 U/L (ref 38–126)
BILIRUBIN TOTAL: 0.6 mg/dL (ref 0.3–1.2)
BUN: 19 mg/dL (ref 6–20)
CO2: 25 mmol/L (ref 22–32)
Calcium: 8.7 mg/dL — ABNORMAL LOW (ref 8.9–10.3)
Chloride: 103 mmol/L (ref 101–111)
Creatinine, Ser: 0.86 mg/dL (ref 0.61–1.24)
GFR calc Af Amer: >60 mL/min (ref >60)
Glucose, Bld: 96 mg/dL (ref 65–99)
POTASSIUM: 4.2 mmol/L (ref 3.5–5.1)
Sodium: 135 mmol/L (ref 135–145)
TOTAL PROTEIN: 6.6 g/dL (ref 6.5–8.1)

## 2018-03-25 LAB — CBC WITH DIFFERENTIAL/PLATELET
BASOS ABS: 0 10*3/uL (ref 0–0.1)
Basophils Relative: 1 %
EOS PCT: 4 %
Eosinophils Absolute: 0.3 10*3/uL (ref 0–0.7)
HEMATOCRIT: 36.2 % — AB (ref 40.0–52.0)
HEMOGLOBIN: 12.8 g/dL — AB (ref 13.0–18.0)
LYMPHS PCT: 21 %
Lymphs Abs: 1.4 10*3/uL (ref 1.0–3.6)
MCH: 34 pg (ref 26.0–34.0)
MCHC: 35.4 g/dL (ref 32.0–36.0)
MCV: 96.2 fL (ref 80.0–100.0)
Monocytes Absolute: 0.6 10*3/uL (ref 0.2–1.0)
Monocytes Relative: 8 %
NEUTROS ABS: 4.6 10*3/uL (ref 1.4–6.5)
NEUTROS PCT: 66 %
PLATELETS: 310 10*3/uL (ref 150–440)
RBC: 3.77 MIL/uL — AB (ref 4.40–5.90)
RDW: 13.3 % (ref 11.5–14.5)
WBC: 7 10*3/uL (ref 3.8–10.6)

## 2018-03-25 LAB — LACTATE DEHYDROGENASE: LDH: 159 U/L (ref 98–192)

## 2018-03-25 NOTE — Assessment & Plan Note (Signed)
#  ITP relapsed; currently on Promacta 25 mg a day.  Platelets 131.  Continue current dose of Promacta.  #Hemolytic anemia hemoglobin 12.5/LDH normal.  Continue CellCept thousand milligrams in the morning 500 mg at night  #Left elbow bursitis improved.  # labs in 2 months; follow up in 4 months/labs.  Will follow-up sooner if needed.

## 2018-03-25 NOTE — Progress Notes (Signed)
[place] OFFICE PROGRESS NOTE  Patient Care Team: Derinda Late, MD as PCP - General (Family Medicine)   SUMMARY OF ONCOLOGIC HISTORY:  #FEB 2016-  ITP  s/p Prednisone; OCT 2016- Relapse of ITP- Prednisone; JAN 13th- START RITUXAN q W x4 [finished Feb 8th]; March 10th 2017- N-plate q W- good response; Dec 2018- promacta 25 mg/day.   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   # Hx of nose bleeds  INTERVAL HISTORY:  A very pleasant 84 year old male patient with a history of autoimmune hemolytic anemia/ITP is here for follow-up.  Patient denies any blood in stools black-colored stools.  Denies any syncopal episodes.  No gum bleeding.   REVIEW OF SYSTEMS:  A complete 10 point review of system is done which is negative except mentioned above/history of present illness.   PAST MEDICAL HISTORY :  Past Medical History:  Diagnosis Date  . Autoimmune hemolytic anemia (HCC)   . Dehydration   . Detached retina   . Hypercholesteremia   . Hypertension   . Insomnia   . 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 Leukocytosis   . Prostate cancer ([place])   . Shingles     PAST SURGICAL HISTORY :   Past Surgical History:  Procedure Laterality Date  . artificial left eye    . EYE SURGERY    . HERNIA REPAIR    . JOINT REPLACEMENT    . PROSTATE SURGERY      FAMILY HISTORY :   Family History  Problem Relation Age of Onset  . Cancer Mother   . Multiple sclerosis Father     SOCIAL HISTORY:   Social History   Tobacco Use  . Smoking status: Never Smoker  Substance Use Topics  . Alcohol use: No  . Drug use: No    ALLERGIES:  is allergic to cephalexin and hydrocodone-acetaminophen.  MEDICATIONS:  Current Outpatient Medications  Medication Sig Dispense Refill  . acetaminophen (TYLENOL) 500 MG tablet Take 500 mg by mouth every 6 (six) hours as needed for mild pain. Reported on 01/24/2016     . citalopram (CELEXA) 10 MG tablet Take 10 mg by mouth daily.    . Cyanocobalamin (RA VITAMIN B-12 TR) 1000 MCG TBCR Take 1,000 mcg by mouth daily.    . feeding supplement, ENSURE ENLIVE, (ENSURE ENLIVE) LIQD Take 237 mLs by mouth 2 (two) times daily between meals. 237 mL 12  . gabapentin (NEURONTIN) 100 MG capsule Take 100 mg by mouth at bedtime. Reported on 12/03/2015    . lisinopril (PRINIVIL,ZESTRIL) 10 MG tablet Take 10 mg by mouth daily.     . mycophenolate (CELLCEPT) 500 MG tablet TAKE 2 TABLETS BY MOUTH EVERY MORNING AND 1 TABLET IN THE AFTERNOON 90 tablet 3  . omeprazole (PRILOSEC) 40 MG capsule TAKE 1 CAPSULE BY MOUTH DAILY USUALLY 30 MINUTES BEFORE BREAKFAST 90 capsule 2  . polyethylene glycol (MIRALAX / GLYCOLAX) packet Take 17 g by mouth daily as needed for mild constipation. 14 each 0  . polyvinyl alcohol (LIQUIFILM TEARS) 1.4 % ophthalmic solution Place 2 drops into the left eye 3 (three) times daily as needed for dry eyes. 15 mL 0  . pravastatin (PRAVACHOL) 20 MG tablet Take 20 mg by mouth at bedtime.     Marland Kitchen PROMACTA 25 MG tablet TAKE 1 TABLET (25 MG TOTAL) BY MOUTH DAILY. TAKE ON AN EMPTY STOMACH 1 HOUR BEFORE A MEAL OR 2 HOURS  AFTER 30 tablet 4   No current facility-administered medications for this visit.     PHYSICAL EXAMINATION:   BP 117/72 (BP Location: Left Arm, Patient Position: Sitting)   Pulse 64   Temp (!) 97.5 F (36.4 C) (Tympanic)   Resp 16   Wt 170 lb 9.6 oz (77.4 kg)   BMI 27.29 kg/m   Filed Weights   03/25/18 1518  Weight: 170 lb 9.6 oz (77.4 kg)    GENERAL: Well-nourished well-developed; Alert, no distress and comfortable.  Accompanied by family.  EYES: no pallor or icterus OROPHARYNX: no thrush or ulceration; NECK: supple; no lymph nodes felt. LYMPH:  no palpable lymphadenopathy in the axillary or inguinal regions LUNGS: Decreased breath sounds auscultation bilaterally. No wheeze or crackles HEART/CVS: regular rate & rhythm and no murmurs; No  lower extremity edema ABDOMEN:abdomen soft, non-tender and normal bowel sounds. No hepatomegaly or splenomegaly.  Musculoskeletal:no cyanosis of digits and no clubbing  PSYCH: alert & oriented x 3 with fluent speech NEURO: no focal motor/sensory deficits SKIN:  no rashes or significant lesions  LABORATORY DATA:  I have reviewed the data as listed    Component Value Date/Time   NA 135 03/25/2018 1458   NA 131 (L) 03/17/2015 1425   K 4.2 03/25/2018 1458   K 3.9 03/17/2015 1425   CL 103 03/25/2018 1458   CL 101 03/17/2015 1425   CO2 25 03/25/2018 1458   CO2 26 03/17/2015 1425   GLUCOSE 96 03/25/2018 1458   GLUCOSE 161 (H) 03/17/2015 1425   BUN 19 03/25/2018 1458   BUN 17 03/17/2015 1425   CREATININE 0.86 03/25/2018 1458   CREATININE 0.90 03/17/2015 1425   CALCIUM 8.7 (L) 03/25/2018 1458   CALCIUM 8.5 (L) 03/17/2015 1425   PROT 6.6 03/25/2018 1458   PROT 6.7 03/17/2015 1425   ALBUMIN 3.6 03/25/2018 1458   ALBUMIN 3.7 03/17/2015 1425   AST 24 03/25/2018 1458   AST 25 03/17/2015 1425   ALT 19 03/25/2018 1458   ALT 23 03/17/2015 1425   ALKPHOS 58 03/25/2018 1458   ALKPHOS 44 03/17/2015 1425   BILITOT 0.6 03/25/2018 1458   BILITOT 0.6 03/17/2015 1425   GFRNONAA >60 03/25/2018 1458   GFRNONAA >60 03/17/2015 1425   GFRAA >60 03/25/2018 1458   GFRAA >60 03/17/2015 1425    No results found for: SPEP, UPEP  Lab Results  Component Value Date   WBC 7.0 03/25/2018   NEUTROABS 4.6 03/25/2018   HGB 12.8 (L) 03/25/2018   HCT 36.2 (L) 03/25/2018   MCV 96.2 03/25/2018   PLT 310 03/25/2018      Chemistry      Component Value Date/Time   NA 135 03/25/2018 1458   NA 131 (L) 03/17/2015 1425   K 4.2 03/25/2018 1458   K 3.9 03/17/2015 1425   CL 103 03/25/2018 1458   CL 101 03/17/2015 1425   CO2 25 03/25/2018 1458   CO2 26 03/17/2015 1425   BUN 19 03/25/2018 1458   BUN 17 03/17/2015 1425   CREATININE 0.86 03/25/2018 1458   CREATININE 0.90 03/17/2015 1425      Component  Value Date/Time   CALCIUM 8.7 (L) 03/25/2018 1458   CALCIUM 8.5 (L) 03/17/2015 1425   ALKPHOS 58 03/25/2018 1458   ALKPHOS 44 03/17/2015 1425   AST 24 03/25/2018 1458   AST 25 03/17/2015 1425   ALT 19 03/25/2018 1458   ALT 23 03/17/2015 1425   BILITOT 0.6 03/25/2018 1458   BILITOT  0.6 03/17/2015 1425       ASSESSMENT & PLAN:   Idiopathic thrombocytopenic purpura ([place]) #ITP relapsed; currently on Promacta 25 mg a day.  Platelets 131.  Continue current dose of Promacta.  #Hemolytic anemia hemoglobin 12.5/LDH normal.  Continue CellCept thousand milligrams in the morning 500 mg at night  #Left elbow bursitis improved.  # labs in 2 months; follow up in 4 months/labs.  Will follow-up sooner if needed.      [name], MD 03/25/2018 3:55 PM

## 2018-03-25 NOTE — Addendum Note (Signed)
Addended by: Sandria Bales B on: 03/25/2018 04:26 PM   Modules accepted: Orders

## 2018-04-16 MED FILL — PROMACTA 25 MG TABLET: 25 | 30 days supply | Qty: 30 | Fill #1

## 2018-05-02 ENCOUNTER — Other Ambulatory Visit: Payer: Self-pay | Admitting: Internal Medicine

## 2018-05-02 DIAGNOSIS — D693 Immune thrombocytopenic purpura: Secondary | ICD-10-CM

## 2018-05-13 ENCOUNTER — Telehealth: Payer: Self-pay | Admitting: *Deleted

## 2018-05-13 DIAGNOSIS — D693 Immune thrombocytopenic purpura: Secondary | ICD-10-CM

## 2018-05-13 DIAGNOSIS — R531 Weakness: Secondary | ICD-10-CM

## 2018-05-13 NOTE — Telephone Encounter (Signed)
Brenda-please inform patient/wife-order CBC BMP. Dr.B

## 2018-05-13 NOTE — Telephone Encounter (Signed)
Per VO Dr B add iron studies as well. Patient will come tomorrow PM

## 2018-05-13 NOTE — Telephone Encounter (Signed)
Wife called and reports that patient is feeling weak and that he has not had labs checked in 7 weeks. And does not come back for several months. She also reports that he was sick last week with a virus, but would like to see if his HGB has dropped an he needs a transfusion. Please advise

## 2018-05-14 ENCOUNTER — Inpatient Hospital Stay: Payer: PPO | Attending: Internal Medicine

## 2018-05-14 ENCOUNTER — Telehealth: Payer: Self-pay | Admitting: *Deleted

## 2018-05-14 ENCOUNTER — Telehealth: Payer: Self-pay | Admitting: Pharmacist

## 2018-05-14 ENCOUNTER — Telehealth: Payer: Self-pay | Admitting: Internal Medicine

## 2018-05-14 DIAGNOSIS — R531 Weakness: Secondary | ICD-10-CM | POA: Insufficient documentation

## 2018-05-14 DIAGNOSIS — D693 Immune thrombocytopenic purpura: Secondary | ICD-10-CM

## 2018-05-14 LAB — CBC WITH DIFFERENTIAL/PLATELET
BASOS ABS: 0 10*3/uL (ref 0–0.1)
Basophils Relative: 0 %
Eosinophils Absolute: 0.4 10*3/uL (ref 0–0.7)
Eosinophils Relative: 4 %
HEMATOCRIT: 37.9 % — AB (ref 40.0–52.0)
Hemoglobin: 13.2 g/dL (ref 13.0–18.0)
LYMPHS ABS: 1.4 10*3/uL (ref 1.0–3.6)
Lymphocytes Relative: 16 %
MCH: 33.8 pg (ref 26.0–34.0)
MCHC: 34.7 g/dL (ref 32.0–36.0)
MCV: 97.4 fL (ref 80.0–100.0)
MONO ABS: 0.8 10*3/uL (ref 0.2–1.0)
MONOS PCT: 9 %
Neutro Abs: 6.3 10*3/uL (ref 1.4–6.5)
Neutrophils Relative %: 71 %
Platelets: 552 10*3/uL — ABNORMAL HIGH (ref 150–440)
RBC: 3.89 MIL/uL — ABNORMAL LOW (ref 4.40–5.90)
RDW: 13.2 % (ref 11.5–14.5)
WBC: 8.9 10*3/uL (ref 3.8–10.6)

## 2018-05-14 LAB — BASIC METABOLIC PANEL
ANION GAP: 8 (ref 5–15)
BUN: 22 mg/dL (ref 8–23)
CALCIUM: 9 mg/dL (ref 8.9–10.3)
CO2: 24 mmol/L (ref 22–32)
Chloride: 104 mmol/L (ref 98–111)
Creatinine, Ser: 0.99 mg/dL (ref 0.61–1.24)
GFR calc non Af Amer: >60 mL/min (ref >60)
Glucose, Bld: 105 mg/dL — ABNORMAL HIGH (ref 70–99)
Potassium: 4.2 mmol/L (ref 3.5–5.1)
Sodium: 136 mmol/L (ref 135–145)

## 2018-05-14 LAB — IRON AND TIBC
IRON: 76 ug/dL (ref 45–182)
Saturation Ratios: 25 % (ref 17.9–39.5)
TIBC: 308 ug/dL (ref 250–450)
UIBC: 232 ug/dL

## 2018-05-14 LAB — FERRITIN: Ferritin: 35 ng/mL (ref 24–336)

## 2018-05-14 MED FILL — PROMACTA 25 MG TABLET: 25 | 30 days supply | Qty: 30 | Fill #2

## 2018-05-14 NOTE — Telephone Encounter (Signed)
Oral Chemotherapy Pharmacist Encounter  Follow-Up Form  Spoke to patient prior to his lab appt today for follow up regarding patient's oral chemotherapy medication: Promacta  Original Start date of oral chemotherapy: 10/2018  Pt reports 0 tablets/doses of Promacta missed in the last month.   Pt reports the following side effects: patient reported feeling a little tired lately. Patient here for a lab check today.   New medications?: None reported  Other Issues: None reported  Patient knows to call the office with questions or concerns. Oral Oncology Clinic will continue to follow.  Darl Pikes, PharmD, BCPS Hematology/Oncology Clinical Pharmacist [facility] [phone]  05/14/2018 3:54 PM

## 2018-05-14 NOTE — Telephone Encounter (Signed)
Per Josh cma in lab- [name] came today to have his labs checked. His plts have jumped to 552 and [name] is very concerned and would like to talk to you. She said that they are going out of town next week. Her cell number is [phone].  Spoke with Dr. Rogue Bussing - md recommended patient take the promacta dosing Monday - Friday only and hold weekend dose-sat. And Sunday.  I returned the patient's wife phone call. This care plan was discussed. Teach back processed performed with pt's wife.

## 2018-05-14 NOTE — Telephone Encounter (Signed)
Please inform patient's wife/patient-platelets 552; as they are concerned/anxious-recommend Promacta 25 mg Monday through Friday; sat-Sunday-OFF.

## 2018-05-27 DIAGNOSIS — C61 Malignant neoplasm of prostate: Secondary | ICD-10-CM | POA: Diagnosis not present

## 2018-05-28 ENCOUNTER — Inpatient Hospital Stay: Payer: PPO | Attending: Internal Medicine

## 2018-05-28 DIAGNOSIS — D693 Immune thrombocytopenic purpura: Secondary | ICD-10-CM | POA: Diagnosis not present

## 2018-05-28 LAB — CBC WITH DIFFERENTIAL/PLATELET
Basophils Absolute: 0 10*3/uL (ref 0–0.1)
Basophils Relative: 0 %
Eosinophils Absolute: 0.3 10*3/uL (ref 0–0.7)
Eosinophils Relative: 4 %
HEMATOCRIT: 36.5 % — AB (ref 40.0–52.0)
HEMOGLOBIN: 12.7 g/dL — AB (ref 13.0–18.0)
LYMPHS PCT: 22 %
Lymphs Abs: 1.5 10*3/uL (ref 1.0–3.6)
MCH: 33.8 pg (ref 26.0–34.0)
MCHC: 34.8 g/dL (ref 32.0–36.0)
MCV: 97.2 fL (ref 80.0–100.0)
MONO ABS: 0.6 10*3/uL (ref 0.2–1.0)
Monocytes Relative: 9 %
NEUTROS ABS: 4.5 10*3/uL (ref 1.4–6.5)
Neutrophils Relative %: 65 %
Platelets: 256 10*3/uL (ref 150–440)
RBC: 3.76 MIL/uL — ABNORMAL LOW (ref 4.40–5.90)
RDW: 12.9 % (ref 11.5–14.5)
WBC: 6.9 10*3/uL (ref 3.8–10.6)

## 2018-05-28 LAB — COMPREHENSIVE METABOLIC PANEL
ALK PHOS: 76 U/L (ref 38–126)
ALT: 18 U/L (ref 0–44)
AST: 22 U/L (ref 15–41)
Albumin: 3.6 g/dL (ref 3.5–5.0)
Anion gap: 9 (ref 5–15)
BILIRUBIN TOTAL: 0.7 mg/dL (ref 0.3–1.2)
BUN: 19 mg/dL (ref 8–23)
CALCIUM: 8.8 mg/dL — AB (ref 8.9–10.3)
CO2: 24 mmol/L (ref 22–32)
CREATININE: 0.91 mg/dL (ref 0.61–1.24)
Chloride: 104 mmol/L (ref 98–111)
GFR calc Af Amer: >60 mL/min (ref >60)
GFR calc non Af Amer: >60 mL/min (ref >60)
GLUCOSE: 100 mg/dL — AB (ref 70–99)
Potassium: 4.1 mmol/L (ref 3.5–5.1)
Sodium: 137 mmol/L (ref 135–145)
TOTAL PROTEIN: 7 g/dL (ref 6.5–8.1)

## 2018-05-28 LAB — LACTATE DEHYDROGENASE: LDH: 141 U/L (ref 98–192)

## 2018-05-29 ENCOUNTER — Telehealth: Payer: Self-pay | Admitting: *Deleted

## 2018-05-29 NOTE — Telephone Encounter (Signed)
Wife called asking what to do about the fact that [name]'s platelet count has drastically dropped over the past 2 weeks; and if is medicine needs to be adjusted. Also asking that he have labs rechecked again in a week instead of waiting until September.She mentioned numerous times in her message that she would like for [name] to call her back. Please advise

## 2018-05-30 ENCOUNTER — Telehealth: Payer: Self-pay | Admitting: Internal Medicine

## 2018-05-30 DIAGNOSIS — D693 Immune thrombocytopenic purpura: Secondary | ICD-10-CM

## 2018-05-30 DIAGNOSIS — Z5181 Encounter for therapeutic drug level monitoring: Secondary | ICD-10-CM

## 2018-05-30 NOTE — Telephone Encounter (Signed)
[place] to wife re: promacta dosing; continie- week ends off;   Please order cbc every 2 weeks/ please order until next visit.  Thx GB

## 2018-05-30 NOTE — Addendum Note (Signed)
Addended by: Betti Cruz on: 05/30/2018 02:19 PM   Modules accepted: Orders

## 2018-05-30 NOTE — Telephone Encounter (Signed)
See separate note.

## 2018-06-03 ENCOUNTER — Other Ambulatory Visit: Payer: PPO

## 2018-06-03 DIAGNOSIS — R55 Syncope and collapse: Secondary | ICD-10-CM | POA: Diagnosis not present

## 2018-06-03 DIAGNOSIS — I10 Essential (primary) hypertension: Secondary | ICD-10-CM | POA: Diagnosis not present

## 2018-06-03 DIAGNOSIS — Z8546 Personal history of malignant neoplasm of prostate: Secondary | ICD-10-CM | POA: Diagnosis not present

## 2018-06-03 DIAGNOSIS — E785 Hyperlipidemia, unspecified: Secondary | ICD-10-CM | POA: Diagnosis not present

## 2018-06-03 DIAGNOSIS — R42 Dizziness and giddiness: Secondary | ICD-10-CM | POA: Diagnosis not present

## 2018-06-03 DIAGNOSIS — K219 Gastro-esophageal reflux disease without esophagitis: Secondary | ICD-10-CM | POA: Diagnosis not present

## 2018-06-10 ENCOUNTER — Inpatient Hospital Stay: Payer: PPO

## 2018-06-10 DIAGNOSIS — D693 Immune thrombocytopenic purpura: Secondary | ICD-10-CM

## 2018-06-10 LAB — COMPREHENSIVE METABOLIC PANEL
ALK PHOS: 65 U/L (ref 38–126)
ALT: 20 U/L (ref 0–44)
AST: 24 U/L (ref 15–41)
Albumin: 3.6 g/dL (ref 3.5–5.0)
Anion gap: 8 (ref 5–15)
BUN: 18 mg/dL (ref 8–23)
CALCIUM: 8.7 mg/dL — AB (ref 8.9–10.3)
CO2: 23 mmol/L (ref 22–32)
CREATININE: 0.87 mg/dL (ref 0.61–1.24)
Chloride: 102 mmol/L (ref 98–111)
GFR calc non Af Amer: >60 mL/min (ref >60)
GLUCOSE: 107 mg/dL — AB (ref 70–99)
Potassium: 3.9 mmol/L (ref 3.5–5.1)
Sodium: 133 mmol/L — ABNORMAL LOW (ref 135–145)
Total Bilirubin: 0.5 mg/dL (ref 0.3–1.2)
Total Protein: 6.7 g/dL (ref 6.5–8.1)

## 2018-06-10 LAB — CBC WITH DIFFERENTIAL/PLATELET
BASOS PCT: 0 %
Basophils Absolute: 0 10*3/uL (ref 0–0.1)
EOS ABS: 0.3 10*3/uL (ref 0–0.7)
EOS PCT: 4 %
HCT: 36.9 % — ABNORMAL LOW (ref 40.0–52.0)
Hemoglobin: 12.9 g/dL — ABNORMAL LOW (ref 13.0–18.0)
Lymphocytes Relative: 20 %
Lymphs Abs: 1.5 10*3/uL (ref 1.0–3.6)
MCH: 33.9 pg (ref 26.0–34.0)
MCHC: 35 g/dL (ref 32.0–36.0)
MCV: 96.7 fL (ref 80.0–100.0)
MONO ABS: 0.6 10*3/uL (ref 0.2–1.0)
MONOS PCT: 8 %
NEUTROS PCT: 68 %
Neutro Abs: 5.3 10*3/uL (ref 1.4–6.5)
PLATELETS: 314 10*3/uL (ref 150–440)
RBC: 3.82 MIL/uL — ABNORMAL LOW (ref 4.40–5.90)
RDW: 13 % (ref 11.5–14.5)
WBC: 7.8 10*3/uL (ref 3.8–10.6)

## 2018-06-10 LAB — LACTATE DEHYDROGENASE: LDH: 144 U/L (ref 98–192)

## 2018-06-11 ENCOUNTER — Telehealth: Payer: Self-pay | Admitting: Internal Medicine

## 2018-06-11 NOTE — Telephone Encounter (Signed)
Please inform patient/wife-platelets/labs are within normal limits; continue Promacta current dose.  No new recommendations follow-up as planned. Thx

## 2018-06-11 NOTE — Telephone Encounter (Signed)
Left vm for patient's wife

## 2018-06-13 DIAGNOSIS — H40051 Ocular hypertension, right eye: Secondary | ICD-10-CM | POA: Diagnosis not present

## 2018-06-17 ENCOUNTER — Other Ambulatory Visit: Payer: PPO

## 2018-06-24 ENCOUNTER — Inpatient Hospital Stay: Payer: PPO | Attending: Internal Medicine

## 2018-06-24 DIAGNOSIS — D693 Immune thrombocytopenic purpura: Secondary | ICD-10-CM | POA: Diagnosis not present

## 2018-06-24 DIAGNOSIS — Z5181 Encounter for therapeutic drug level monitoring: Secondary | ICD-10-CM

## 2018-06-24 LAB — CBC WITH DIFFERENTIAL/PLATELET
Basophils Absolute: 0 10*3/uL (ref 0–0.1)
Basophils Relative: 0 %
EOS PCT: 4 %
Eosinophils Absolute: 0.3 10*3/uL (ref 0–0.7)
HCT: 38.3 % — ABNORMAL LOW (ref 40.0–52.0)
Hemoglobin: 13 g/dL (ref 13.0–18.0)
LYMPHS PCT: 20 %
Lymphs Abs: 1.5 10*3/uL (ref 1.0–3.6)
MCH: 33.5 pg (ref 26.0–34.0)
MCHC: 34 g/dL (ref 32.0–36.0)
MCV: 98.6 fL (ref 80.0–100.0)
MONOS PCT: 8 %
Monocytes Absolute: 0.6 10*3/uL (ref 0.2–1.0)
NEUTROS PCT: 68 %
Neutro Abs: 5.1 10*3/uL (ref 1.4–6.5)
PLATELETS: 246 10*3/uL (ref 150–440)
RBC: 3.89 MIL/uL — AB (ref 4.40–5.90)
RDW: 13.2 % (ref 11.5–14.5)
WBC: 7.6 10*3/uL (ref 3.8–10.6)

## 2018-06-27 MED FILL — PROMACTA 25 MG TABLET: 25 | 30 days supply | Qty: 30 | Fill #3

## 2018-07-01 ENCOUNTER — Other Ambulatory Visit: Payer: PPO

## 2018-07-08 ENCOUNTER — Inpatient Hospital Stay: Payer: PPO

## 2018-07-08 DIAGNOSIS — D693 Immune thrombocytopenic purpura: Secondary | ICD-10-CM | POA: Diagnosis not present

## 2018-07-08 DIAGNOSIS — Z5181 Encounter for therapeutic drug level monitoring: Secondary | ICD-10-CM

## 2018-07-08 LAB — CBC WITH DIFFERENTIAL/PLATELET
BASOS ABS: 0 10*3/uL (ref 0–0.1)
Basophils Relative: 0 %
Eosinophils Absolute: 0.3 10*3/uL (ref 0–0.7)
Eosinophils Relative: 4 %
HEMATOCRIT: 38.6 % — AB (ref 40.0–52.0)
Hemoglobin: 13.2 g/dL (ref 13.0–18.0)
LYMPHS PCT: 21 %
Lymphs Abs: 1.6 10*3/uL (ref 1.0–3.6)
MCH: 33.6 pg (ref 26.0–34.0)
MCHC: 34.2 g/dL (ref 32.0–36.0)
MCV: 98.2 fL (ref 80.0–100.0)
Monocytes Absolute: 0.7 10*3/uL (ref 0.2–1.0)
Monocytes Relative: 9 %
NEUTROS ABS: 5.1 10*3/uL (ref 1.4–6.5)
Neutrophils Relative %: 66 %
Platelets: 291 10*3/uL (ref 150–440)
RBC: 3.93 MIL/uL — AB (ref 4.40–5.90)
RDW: 13.2 % (ref 11.5–14.5)
WBC: 7.7 10*3/uL (ref 3.8–10.6)

## 2018-07-08 LAB — COMPREHENSIVE METABOLIC PANEL
ALBUMIN: 3.7 g/dL (ref 3.5–5.0)
ALK PHOS: 66 U/L (ref 38–126)
ALT: 22 U/L (ref 0–44)
AST: 25 U/L (ref 15–41)
Anion gap: 8 (ref 5–15)
BILIRUBIN TOTAL: 0.5 mg/dL (ref 0.3–1.2)
BUN: 16 mg/dL (ref 8–23)
CALCIUM: 9 mg/dL (ref 8.9–10.3)
CO2: 25 mmol/L (ref 22–32)
Chloride: 104 mmol/L (ref 98–111)
Creatinine, Ser: 0.93 mg/dL (ref 0.61–1.24)
GFR calc Af Amer: >60 mL/min (ref >60)
GFR calc non Af Amer: >60 mL/min (ref >60)
GLUCOSE: 104 mg/dL — AB (ref 70–99)
POTASSIUM: 4.4 mmol/L (ref 3.5–5.1)
Sodium: 137 mmol/L (ref 135–145)
TOTAL PROTEIN: 7 g/dL (ref 6.5–8.1)

## 2018-07-08 LAB — LACTATE DEHYDROGENASE: LDH: 152 U/L (ref 98–192)

## 2018-07-10 DIAGNOSIS — E7800 Pure hypercholesterolemia, unspecified: Secondary | ICD-10-CM | POA: Diagnosis not present

## 2018-07-10 DIAGNOSIS — I10 Essential (primary) hypertension: Secondary | ICD-10-CM | POA: Diagnosis not present

## 2018-07-10 DIAGNOSIS — Z862 Personal history of diseases of the blood and blood-forming organs and certain disorders involving the immune mechanism: Secondary | ICD-10-CM | POA: Diagnosis not present

## 2018-07-10 DIAGNOSIS — R55 Syncope and collapse: Secondary | ICD-10-CM | POA: Diagnosis not present

## 2018-07-15 ENCOUNTER — Other Ambulatory Visit: Payer: PPO

## 2018-07-23 ENCOUNTER — Inpatient Hospital Stay: Payer: PPO | Attending: Internal Medicine

## 2018-07-23 DIAGNOSIS — D693 Immune thrombocytopenic purpura: Secondary | ICD-10-CM | POA: Insufficient documentation

## 2018-07-23 DIAGNOSIS — D591 Other autoimmune hemolytic anemias: Secondary | ICD-10-CM | POA: Insufficient documentation

## 2018-07-23 DIAGNOSIS — Z5181 Encounter for therapeutic drug level monitoring: Secondary | ICD-10-CM

## 2018-07-23 DIAGNOSIS — Z79899 Other long term (current) drug therapy: Secondary | ICD-10-CM | POA: Diagnosis not present

## 2018-07-23 LAB — CBC WITH DIFFERENTIAL/PLATELET
Basophils Absolute: 0 10*3/uL (ref 0–0.1)
Basophils Relative: 0 %
EOS ABS: 0.3 10*3/uL (ref 0–0.7)
EOS PCT: 4 %
HCT: 38.6 % — ABNORMAL LOW (ref 40.0–52.0)
Hemoglobin: 13.1 g/dL (ref 13.0–18.0)
Lymphocytes Relative: 23 %
Lymphs Abs: 1.6 10*3/uL (ref 1.0–3.6)
MCH: 33.3 pg (ref 26.0–34.0)
MCHC: 34 g/dL (ref 32.0–36.0)
MCV: 97.9 fL (ref 80.0–100.0)
MONOS PCT: 9 %
Monocytes Absolute: 0.7 10*3/uL (ref 0.2–1.0)
Neutro Abs: 4.7 10*3/uL (ref 1.4–6.5)
Neutrophils Relative %: 64 %
PLATELETS: 262 10*3/uL (ref 150–440)
RBC: 3.94 MIL/uL — ABNORMAL LOW (ref 4.40–5.90)
RDW: 13 % (ref 11.5–14.5)
WBC: 7.3 10*3/uL (ref 3.8–10.6)

## 2018-07-29 ENCOUNTER — Other Ambulatory Visit: Payer: PPO

## 2018-07-29 ENCOUNTER — Ambulatory Visit: Payer: PPO | Admitting: Internal Medicine

## 2018-08-05 ENCOUNTER — Inpatient Hospital Stay (HOSPITAL_BASED_OUTPATIENT_CLINIC_OR_DEPARTMENT_OTHER): Payer: PPO | Admitting: Internal Medicine

## 2018-08-05 ENCOUNTER — Inpatient Hospital Stay: Payer: PPO

## 2018-08-05 ENCOUNTER — Encounter: Payer: Self-pay | Admitting: Internal Medicine

## 2018-08-05 DIAGNOSIS — D591 Other autoimmune hemolytic anemias: Secondary | ICD-10-CM | POA: Diagnosis not present

## 2018-08-05 DIAGNOSIS — Z79899 Other long term (current) drug therapy: Secondary | ICD-10-CM

## 2018-08-05 DIAGNOSIS — D693 Immune thrombocytopenic purpura: Secondary | ICD-10-CM

## 2018-08-05 LAB — CBC WITH DIFFERENTIAL/PLATELET
Basophils Absolute: 0 10*3/uL (ref 0–0.1)
Basophils Relative: 1 %
Eosinophils Absolute: 0.3 10*3/uL (ref 0–0.7)
Eosinophils Relative: 5 %
HEMATOCRIT: 38.8 % — AB (ref 40.0–52.0)
HEMOGLOBIN: 13.2 g/dL (ref 13.0–18.0)
LYMPHS ABS: 1.6 10*3/uL (ref 1.0–3.6)
Lymphocytes Relative: 25 %
MCH: 33.6 pg (ref 26.0–34.0)
MCHC: 34 g/dL (ref 32.0–36.0)
MCV: 98.7 fL (ref 80.0–100.0)
MONOS PCT: 10 %
Monocytes Absolute: 0.7 10*3/uL (ref 0.2–1.0)
NEUTROS ABS: 3.9 10*3/uL (ref 1.4–6.5)
NEUTROS PCT: 59 %
Platelets: 319 10*3/uL (ref 150–440)
RBC: 3.93 MIL/uL — AB (ref 4.40–5.90)
RDW: 13.5 % (ref 11.5–14.5)
WBC: 6.5 10*3/uL (ref 3.8–10.6)

## 2018-08-05 LAB — COMPREHENSIVE METABOLIC PANEL
ALT: 17 U/L (ref 0–44)
AST: 23 U/L (ref 15–41)
Albumin: 3.8 g/dL (ref 3.5–5.0)
Alkaline Phosphatase: 62 U/L (ref 38–126)
Anion gap: 8 (ref 5–15)
BUN: 15 mg/dL (ref 8–23)
CO2: 25 mmol/L (ref 22–32)
CREATININE: 0.94 mg/dL (ref 0.61–1.24)
Calcium: 8.6 mg/dL — ABNORMAL LOW (ref 8.9–10.3)
Chloride: 105 mmol/L (ref 98–111)
GFR calc Af Amer: >60 mL/min (ref >60)
GFR calc non Af Amer: >60 mL/min (ref >60)
Glucose, Bld: 91 mg/dL (ref 70–99)
Potassium: 3.8 mmol/L (ref 3.5–5.1)
SODIUM: 138 mmol/L (ref 135–145)
Total Bilirubin: 0.4 mg/dL (ref 0.3–1.2)
Total Protein: 6.7 g/dL (ref 6.5–8.1)

## 2018-08-05 LAB — LACTATE DEHYDROGENASE: LDH: 144 U/L (ref 98–192)

## 2018-08-05 NOTE — Assessment & Plan Note (Signed)
#  ITP relapsed; currently on Promacta 25 mg- M-F.  Platelets 319. Continue Promacta; but space out to 25 mg every other day.   #Hemolytic anemia hemoglobin 12.5/LDH normal.  Continue CellCept 1000 milligrams in the morning 500 mg at night  # labs q 2 weeks; follow up in 4 months/labs-cbc/cmp- Dr.B

## 2018-08-05 NOTE — Progress Notes (Signed)
[place] OFFICE PROGRESS NOTE  Patient Care Team: Derinda Late, MD as PCP - General (Family Medicine)   SUMMARY OF ONCOLOGIC HISTORY:  #FEB 2016-  ITP  s/p Prednisone; OCT 2016- Relapse of ITP- Prednisone; JAN 13th- START RITUXAN q W x4 [finished Feb 8th]; March 10th 2017- N-plate q W- good response; Dec 2018- promacta 25 mg/day.   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   # Hx of nose bleeds  INTERVAL HISTORY:  A very pleasant 84 year old male patient with a history of autoimmune hemolytic anemia/ITP is here for follow-up.  Patient denies any blood in stools black or stools.  Denies any gum bleeding nosebleeding.  Appetite is good.  Review of Systems  Constitutional: Negative for chills, diaphoresis, fever, malaise/fatigue and weight loss.  HENT: Negative for nosebleeds and sore throat.   Eyes: Negative for double vision.  Respiratory: Negative for cough, hemoptysis, sputum production, shortness of breath and wheezing.   Cardiovascular: Negative for chest pain, palpitations, orthopnea and leg swelling.  Gastrointestinal: Negative for abdominal pain, blood in stool, constipation, diarrhea, heartburn, melena, nausea and vomiting.  Genitourinary: Negative for dysuria, frequency and urgency.  Musculoskeletal: Negative for back pain and joint pain.  Skin: Negative.  Negative for itching and rash.  Neurological: Negative for dizziness, tingling, focal weakness, weakness and headaches.  Endo/Heme/Allergies: Does not bruise/bleed easily.  Psychiatric/Behavioral: Negative for depression. The patient is not nervous/anxious and does not have insomnia.     PAST MEDICAL HISTORY :  Past Medical History:  Diagnosis Date  . Autoimmune hemolytic anemia (HCC)   . Dehydration   . Detached retina   . Hypercholesteremia   . Hypertension   . Insomnia   . ITP (idiopathic thrombocytopenic purpura) 09/01/2015   . Leukocytosis   . Prostate cancer ([place])   . Shingles     PAST SURGICAL HISTORY :   Past Surgical History:  Procedure Laterality Date  . artificial left eye    . EYE SURGERY    . HERNIA REPAIR    . JOINT REPLACEMENT    . PROSTATE SURGERY      FAMILY HISTORY :   Family History  Problem Relation Age of Onset  . Cancer Mother   . Multiple sclerosis Father     SOCIAL HISTORY:   Social History   Tobacco Use  . Smoking status: Never Smoker  Substance Use Topics  . Alcohol use: No  . Drug use: No    ALLERGIES:  is allergic to cephalexin and hydrocodone-acetaminophen.  MEDICATIONS:  Current Outpatient Medications  Medication Sig Dispense Refill  . acetaminophen (TYLENOL) 500 MG tablet Take 500 mg by mouth every 6 (six) hours as needed for mild pain. Reported on 01/24/2016    . citalopram (CELEXA) 10 MG tablet Take 10 mg by mouth daily.    . Cyanocobalamin (RA VITAMIN B-12 TR) 1000 MCG TBCR Take 1,000 mcg by mouth daily.    . feeding supplement, ENSURE ENLIVE, (ENSURE ENLIVE) LIQD Take 237 mLs by mouth 2 (two) times daily between meals. 237 mL 12  . gabapentin (NEURONTIN) 100 MG capsule Take 100 mg by mouth at bedtime. Reported on 12/03/2015    . lisinopril (PRINIVIL,ZESTRIL) 10 MG tablet Take 10 mg by mouth daily.     . mycophenolate (CELLCEPT) 500 MG tablet TAKE 2 TABLETS BY MOUTH EVERY MORNING AND 1 TABLET IN THE AFTERNOON 90 tablet 3  . omeprazole (PRILOSEC) 40 MG capsule TAKE  1 CAPSULE BY MOUTH DAILY USUALLY 30 MINUTES BEFORE BREAKFAST 90 capsule 2  . polyethylene glycol (MIRALAX / GLYCOLAX) packet Take 17 g by mouth daily as needed for mild constipation. 14 each 0  . polyvinyl alcohol (LIQUIFILM TEARS) 1.4 % ophthalmic solution Place 2 drops into the left eye 3 (three) times daily as needed for dry eyes. 15 mL 0  . pravastatin (PRAVACHOL) 20 MG tablet Take 20 mg by mouth at bedtime.     Marland Kitchen PROMACTA 25 MG tablet TAKE 1 TABLET (25 MG TOTAL) BY MOUTH DAILY. TAKE ON AN EMPTY  STOMACH 1 HOUR BEFORE A MEAL OR 2 HOURS AFTER 30 tablet 4   No current facility-administered medications for this visit.     PHYSICAL EXAMINATION:   BP 128/74 (BP Location: Left Arm, Patient Position: Sitting)   Pulse 62   Temp 98.1 F (36.7 C) (Tympanic)   Resp 16   There were no vitals filed for this visit.  Physical Exam  Constitutional: He is oriented to person, place, and time and well-developed, well-nourished, and in no distress.  HENT:  Head: Normocephalic and atraumatic.  Mouth/Throat: Oropharynx is clear and moist. No oropharyngeal exudate.  Eyes: Pupils are equal, round, and reactive to light.  Neck: Normal range of motion. Neck supple.  Cardiovascular: Normal rate and regular rhythm.  Pulmonary/Chest: No respiratory distress. He has no wheezes.  Abdominal: Soft. Bowel sounds are normal. He exhibits no distension and no mass. There is no tenderness. There is no rebound and no guarding.  Musculoskeletal: Normal range of motion. He exhibits no edema or tenderness.  Neurological: He is alert and oriented to person, place, and time.  Skin: Skin is warm.  Psychiatric: Affect normal.     LABORATORY DATA:  I have reviewed the data as listed    Component Value Date/Time   NA 138 08/05/2018 1414   NA 131 (L) 03/17/2015 1425   K 3.8 08/05/2018 1414   K 3.9 03/17/2015 1425   CL 105 08/05/2018 1414   CL 101 03/17/2015 1425   CO2 25 08/05/2018 1414   CO2 26 03/17/2015 1425   GLUCOSE 91 08/05/2018 1414   GLUCOSE 161 (H) 03/17/2015 1425   BUN 15 08/05/2018 1414   BUN 17 03/17/2015 1425   CREATININE 0.94 08/05/2018 1414   CREATININE 0.90 03/17/2015 1425   CALCIUM 8.6 (L) 08/05/2018 1414   CALCIUM 8.5 (L) 03/17/2015 1425   PROT 6.7 08/05/2018 1414   PROT 6.7 03/17/2015 1425   ALBUMIN 3.8 08/05/2018 1414   ALBUMIN 3.7 03/17/2015 1425   AST 23 08/05/2018 1414   AST 25 03/17/2015 1425   ALT 17 08/05/2018 1414   ALT 23 03/17/2015 1425   ALKPHOS 62 08/05/2018 1414    ALKPHOS 44 03/17/2015 1425   BILITOT 0.4 08/05/2018 1414   BILITOT 0.6 03/17/2015 1425   GFRNONAA >60 08/05/2018 1414   GFRNONAA >60 03/17/2015 1425   GFRAA >60 08/05/2018 1414   GFRAA >60 03/17/2015 1425    No results found for: SPEP, UPEP  Lab Results  Component Value Date   WBC 6.5 08/05/2018   NEUTROABS 3.9 08/05/2018   HGB 13.2 08/05/2018   HCT 38.8 (L) 08/05/2018   MCV 98.7 08/05/2018   PLT 319 08/05/2018      Chemistry      Component Value Date/Time   NA 138 08/05/2018 1414   NA 131 (L) 03/17/2015 1425   K 3.8 08/05/2018 1414   K 3.9 03/17/2015 1425  CL 105 08/05/2018 1414   CL 101 03/17/2015 1425   CO2 25 08/05/2018 1414   CO2 26 03/17/2015 1425   BUN 15 08/05/2018 1414   BUN 17 03/17/2015 1425   CREATININE 0.94 08/05/2018 1414   CREATININE 0.90 03/17/2015 1425      Component Value Date/Time   CALCIUM 8.6 (L) 08/05/2018 1414   CALCIUM 8.5 (L) 03/17/2015 1425   ALKPHOS 62 08/05/2018 1414   ALKPHOS 44 03/17/2015 1425   AST 23 08/05/2018 1414   AST 25 03/17/2015 1425   ALT 17 08/05/2018 1414   ALT 23 03/17/2015 1425   BILITOT 0.4 08/05/2018 1414   BILITOT 0.6 03/17/2015 1425       ASSESSMENT & PLAN:   Idiopathic thrombocytopenic purpura ([place]) #ITP relapsed; currently on Promacta 25 mg- M-F.  Platelets 319. Continue Promacta; but space out to 25 mg every other day.   #Hemolytic anemia hemoglobin 12.5/LDH normal.  Continue CellCept 1000 milligrams in the morning 500 mg at night  # labs q 2 weeks; follow up in 4 months/labs-cbc/cmp- Dr.B      [name], MD 08/05/2018 3:20 PM

## 2018-08-06 MED FILL — PROMACTA 25 MG TABLET: 25 | 30 days supply | Qty: 30 | Fill #4

## 2018-08-08 DIAGNOSIS — R55 Syncope and collapse: Secondary | ICD-10-CM | POA: Diagnosis not present

## 2018-08-08 DIAGNOSIS — I10 Essential (primary) hypertension: Secondary | ICD-10-CM | POA: Diagnosis not present

## 2018-08-19 ENCOUNTER — Inpatient Hospital Stay: Payer: PPO

## 2018-08-19 DIAGNOSIS — D693 Immune thrombocytopenic purpura: Secondary | ICD-10-CM

## 2018-08-19 LAB — COMPREHENSIVE METABOLIC PANEL
ALT: 24 U/L (ref 0–44)
ANION GAP: 6 (ref 5–15)
AST: 25 U/L (ref 15–41)
Albumin: 3.7 g/dL (ref 3.5–5.0)
Alkaline Phosphatase: 63 U/L (ref 38–126)
BUN: 16 mg/dL (ref 8–23)
CO2: 26 mmol/L (ref 22–32)
Calcium: 8.9 mg/dL (ref 8.9–10.3)
Chloride: 105 mmol/L (ref 98–111)
Creatinine, Ser: 0.79 mg/dL (ref 0.61–1.24)
GFR calc non Af Amer: >60 mL/min (ref >60)
Glucose, Bld: 97 mg/dL (ref 70–99)
POTASSIUM: 4.3 mmol/L (ref 3.5–5.1)
Sodium: 137 mmol/L (ref 135–145)
Total Bilirubin: 0.6 mg/dL (ref 0.3–1.2)
Total Protein: 6.7 g/dL (ref 6.5–8.1)

## 2018-08-19 LAB — CBC WITH DIFFERENTIAL/PLATELET
BASOS ABS: 0 10*3/uL (ref 0–0.1)
Basophils Relative: 1 %
Eosinophils Absolute: 0.4 10*3/uL (ref 0–0.7)
Eosinophils Relative: 5 %
HCT: 39 % — ABNORMAL LOW (ref 40.0–52.0)
HEMOGLOBIN: 13.4 g/dL (ref 13.0–18.0)
LYMPHS PCT: 19 %
Lymphs Abs: 1.6 10*3/uL (ref 1.0–3.6)
MCH: 33.6 pg (ref 26.0–34.0)
MCHC: 34.4 g/dL (ref 32.0–36.0)
MCV: 97.8 fL (ref 80.0–100.0)
Monocytes Absolute: 0.7 10*3/uL (ref 0.2–1.0)
Monocytes Relative: 8 %
NEUTROS ABS: 5.4 10*3/uL (ref 1.4–6.5)
NEUTROS PCT: 67 %
Platelets: 209 10*3/uL (ref 150–440)
RBC: 3.98 MIL/uL — AB (ref 4.40–5.90)
RDW: 13.2 % (ref 11.5–14.5)
WBC: 8.1 10*3/uL (ref 3.8–10.6)

## 2018-08-19 LAB — LACTATE DEHYDROGENASE: LDH: 143 U/L (ref 98–192)

## 2018-09-02 ENCOUNTER — Other Ambulatory Visit: Payer: Self-pay | Admitting: Oncology

## 2018-09-02 ENCOUNTER — Inpatient Hospital Stay: Payer: PPO | Attending: Hematology and Oncology

## 2018-09-02 DIAGNOSIS — D693 Immune thrombocytopenic purpura: Secondary | ICD-10-CM | POA: Diagnosis not present

## 2018-09-02 DIAGNOSIS — Z5181 Encounter for therapeutic drug level monitoring: Secondary | ICD-10-CM

## 2018-09-02 LAB — CBC WITH DIFFERENTIAL/PLATELET
ABS IMMATURE GRANULOCYTES: 0.03 10*3/uL (ref 0.00–0.07)
BASOS ABS: 0 10*3/uL (ref 0.0–0.1)
Basophils Relative: 0 %
Eosinophils Absolute: 0.2 10*3/uL (ref 0.0–0.5)
Eosinophils Relative: 3 %
HEMATOCRIT: 39.1 % (ref 39.0–52.0)
HEMOGLOBIN: 13.1 g/dL (ref 13.0–17.0)
Immature Granulocytes: 0 %
LYMPHS ABS: 1.6 10*3/uL (ref 0.7–4.0)
LYMPHS PCT: 20 %
MCH: 32.3 pg (ref 26.0–34.0)
MCHC: 33.5 g/dL (ref 30.0–36.0)
MCV: 96.5 fL (ref 80.0–100.0)
Monocytes Absolute: 0.6 10*3/uL (ref 0.1–1.0)
Monocytes Relative: 8 %
NEUTROS ABS: 5.5 10*3/uL (ref 1.7–7.7)
NRBC: 0 % (ref 0.0–0.2)
Neutrophils Relative %: 69 %
Platelets: 244 10*3/uL (ref 150–400)
RBC: 4.05 MIL/uL — AB (ref 4.22–5.81)
RDW: 12.8 % (ref 11.5–15.5)
WBC: 8 10*3/uL (ref 4.0–10.5)

## 2018-09-02 LAB — COMPREHENSIVE METABOLIC PANEL
ALBUMIN: 3.7 g/dL (ref 3.5–5.0)
ALK PHOS: 64 U/L (ref 38–126)
ALT: 22 U/L (ref 0–44)
AST: 24 U/L (ref 15–41)
Anion gap: 6 (ref 5–15)
BUN: 18 mg/dL (ref 8–23)
CHLORIDE: 105 mmol/L (ref 98–111)
CO2: 25 mmol/L (ref 22–32)
CREATININE: 0.87 mg/dL (ref 0.61–1.24)
Calcium: 8.9 mg/dL (ref 8.9–10.3)
GFR calc non Af Amer: >60 mL/min (ref >60)
GLUCOSE: 112 mg/dL — AB (ref 70–99)
Potassium: 4.1 mmol/L (ref 3.5–5.1)
Sodium: 136 mmol/L (ref 135–145)
Total Bilirubin: 0.7 mg/dL (ref 0.3–1.2)
Total Protein: 6.7 g/dL (ref 6.5–8.1)

## 2018-09-02 LAB — LACTATE DEHYDROGENASE: LDH: 147 U/L (ref 98–192)

## 2018-09-02 NOTE — Telephone Encounter (Signed)
(...     Ref Range & Units 2wk ago  WBC 3.8 - 10.6 K/uL 8.1   RBC 4.40 - 5.90 MIL/uL 3.98Low    Hemoglobin 13.0 - 18.0 g/dL 13.4   HCT 40.0 - 52.0 % 39.0Low    MCV 80.0 - 100.0 fL 97.8   MCH 26.0 - 34.0 pg 33.6   MCHC 32.0 - 36.0 g/dL 34.4   RDW 11.5 - 14.5 % 13.2   Platelets 150 - 440 K/uL 209   Neutrophils Relative % % 67   Neutro Abs 1.4 - 6.5 K/uL 5.4   Lymphocytes Relative % 19   Lymphs Abs 1.0 - 3.6 K/uL 1.6   Monocytes Relative % 8   Monocytes Absolute 0.2 - 1.0 K/uL 0.7   Eosinophils Relative % 5   Eosinophils Absolute 0 - 0.7 K/uL 0.4   Basophils Relative % 1   Basophils Absolute 0 - 0.1 K/uL 0.0   Comment: Performed at [facility], [address] [place], [place] [address]  Resulting Agency  [facility]      Specimen Collected: 08/19/18 13:54 Last Resulted: 08/19/18 14:03

## 2018-09-09 ENCOUNTER — Other Ambulatory Visit: Payer: Self-pay | Admitting: Internal Medicine

## 2018-09-09 DIAGNOSIS — D693 Immune thrombocytopenic purpura: Secondary | ICD-10-CM

## 2018-09-10 MED FILL — PROMACTA 25 MG TABLET: 25 | 30 days supply | Qty: 30 | Fill #0

## 2018-09-16 ENCOUNTER — Other Ambulatory Visit: Payer: Self-pay

## 2018-09-16 ENCOUNTER — Inpatient Hospital Stay: Payer: PPO

## 2018-09-16 DIAGNOSIS — D693 Immune thrombocytopenic purpura: Secondary | ICD-10-CM

## 2018-09-16 DIAGNOSIS — Z5181 Encounter for therapeutic drug level monitoring: Secondary | ICD-10-CM

## 2018-09-16 LAB — CBC WITH DIFFERENTIAL/PLATELET
Abs Immature Granulocytes: 0.02 10*3/uL (ref 0.00–0.07)
BASOS ABS: 0 10*3/uL (ref 0.0–0.1)
Basophils Relative: 1 %
EOS PCT: 2 %
Eosinophils Absolute: 0.2 10*3/uL (ref 0.0–0.5)
HCT: 39.3 % (ref 39.0–52.0)
HEMOGLOBIN: 12.9 g/dL — AB (ref 13.0–17.0)
Immature Granulocytes: 0 %
LYMPHS PCT: 20 %
Lymphs Abs: 1.6 10*3/uL (ref 0.7–4.0)
MCH: 32.3 pg (ref 26.0–34.0)
MCHC: 32.8 g/dL (ref 30.0–36.0)
MCV: 98.5 fL (ref 80.0–100.0)
Monocytes Absolute: 0.7 10*3/uL (ref 0.1–1.0)
Monocytes Relative: 8 %
NEUTROS ABS: 5.7 10*3/uL (ref 1.7–7.7)
NRBC: 0 % (ref 0.0–0.2)
Neutrophils Relative %: 69 %
PLATELETS: 228 10*3/uL (ref 150–400)
RBC: 3.99 MIL/uL — ABNORMAL LOW (ref 4.22–5.81)
RDW: 12.8 % (ref 11.5–15.5)
WBC: 8.3 10*3/uL (ref 4.0–10.5)

## 2018-09-30 ENCOUNTER — Inpatient Hospital Stay: Payer: PPO | Attending: Internal Medicine

## 2018-09-30 DIAGNOSIS — Z79899 Other long term (current) drug therapy: Secondary | ICD-10-CM | POA: Insufficient documentation

## 2018-09-30 DIAGNOSIS — D693 Immune thrombocytopenic purpura: Secondary | ICD-10-CM | POA: Diagnosis not present

## 2018-09-30 DIAGNOSIS — D591 Other autoimmune hemolytic anemias: Secondary | ICD-10-CM | POA: Insufficient documentation

## 2018-09-30 LAB — COMPREHENSIVE METABOLIC PANEL
ALK PHOS: 62 U/L (ref 38–126)
ALT: 21 U/L (ref 0–44)
AST: 24 U/L (ref 15–41)
Albumin: 3.8 g/dL (ref 3.5–5.0)
Anion gap: 7 (ref 5–15)
BILIRUBIN TOTAL: 0.5 mg/dL (ref 0.3–1.2)
BUN: 16 mg/dL (ref 8–23)
CALCIUM: 8.8 mg/dL — AB (ref 8.9–10.3)
CO2: 26 mmol/L (ref 22–32)
Chloride: 102 mmol/L (ref 98–111)
Creatinine, Ser: 0.97 mg/dL (ref 0.61–1.24)
GFR calc Af Amer: >60 mL/min (ref >60)
Glucose, Bld: 115 mg/dL — ABNORMAL HIGH (ref 70–99)
Potassium: 3.9 mmol/L (ref 3.5–5.1)
Sodium: 135 mmol/L (ref 135–145)
TOTAL PROTEIN: 6.8 g/dL (ref 6.5–8.1)

## 2018-09-30 LAB — CBC WITH DIFFERENTIAL/PLATELET
ABS IMMATURE GRANULOCYTES: 0.02 10*3/uL (ref 0.00–0.07)
BASOS PCT: 0 %
Basophils Absolute: 0 10*3/uL (ref 0.0–0.1)
EOS ABS: 0.2 10*3/uL (ref 0.0–0.5)
EOS PCT: 3 %
HCT: 39.7 % (ref 39.0–52.0)
Hemoglobin: 13.1 g/dL (ref 13.0–17.0)
Immature Granulocytes: 0 %
Lymphocytes Relative: 21 %
Lymphs Abs: 1.5 10*3/uL (ref 0.7–4.0)
MCH: 32.4 pg (ref 26.0–34.0)
MCHC: 33 g/dL (ref 30.0–36.0)
MCV: 98.3 fL (ref 80.0–100.0)
MONO ABS: 0.6 10*3/uL (ref 0.1–1.0)
MONOS PCT: 9 %
NEUTROS ABS: 5 10*3/uL (ref 1.7–7.7)
Neutrophils Relative %: 67 %
PLATELETS: 238 10*3/uL (ref 150–400)
RBC: 4.04 MIL/uL — AB (ref 4.22–5.81)
RDW: 13 % (ref 11.5–15.5)
WBC: 7.3 10*3/uL (ref 4.0–10.5)
nRBC: 0 % (ref 0.0–0.2)

## 2018-09-30 LAB — LACTATE DEHYDROGENASE: LDH: 138 U/L (ref 98–192)

## 2018-10-03 MED FILL — PROMACTA 25 MG TABLET: 25 | 30 days supply | Qty: 30 | Fill #1

## 2018-10-14 ENCOUNTER — Inpatient Hospital Stay: Payer: PPO

## 2018-10-14 DIAGNOSIS — D693 Immune thrombocytopenic purpura: Secondary | ICD-10-CM | POA: Diagnosis not present

## 2018-10-14 LAB — CBC WITH DIFFERENTIAL/PLATELET
Abs Immature Granulocytes: 0.02 10*3/uL (ref 0.00–0.07)
Basophils Absolute: 0 10*3/uL (ref 0.0–0.1)
Basophils Relative: 1 %
EOS ABS: 0.2 10*3/uL (ref 0.0–0.5)
EOS PCT: 2 %
HCT: 38.9 % — ABNORMAL LOW (ref 39.0–52.0)
Hemoglobin: 13 g/dL (ref 13.0–17.0)
Immature Granulocytes: 0 %
Lymphocytes Relative: 17 %
Lymphs Abs: 1.4 10*3/uL (ref 0.7–4.0)
MCH: 33.1 pg (ref 26.0–34.0)
MCHC: 33.4 g/dL (ref 30.0–36.0)
MCV: 99 fL (ref 80.0–100.0)
MONO ABS: 0.6 10*3/uL (ref 0.1–1.0)
MONOS PCT: 7 %
Neutro Abs: 6 10*3/uL (ref 1.7–7.7)
Neutrophils Relative %: 73 %
Platelets: 259 10*3/uL (ref 150–400)
RBC: 3.93 MIL/uL — ABNORMAL LOW (ref 4.22–5.81)
RDW: 12.9 % (ref 11.5–15.5)
WBC: 8.2 10*3/uL (ref 4.0–10.5)
nRBC: 0 % (ref 0.0–0.2)

## 2018-10-14 LAB — COMPREHENSIVE METABOLIC PANEL
ALK PHOS: 62 U/L (ref 38–126)
ALT: 20 U/L (ref 0–44)
AST: 24 U/L (ref 15–41)
Albumin: 3.9 g/dL (ref 3.5–5.0)
Anion gap: 6 (ref 5–15)
BILIRUBIN TOTAL: 0.5 mg/dL (ref 0.3–1.2)
BUN: 16 mg/dL (ref 8–23)
CALCIUM: 8.9 mg/dL (ref 8.9–10.3)
CO2: 27 mmol/L (ref 22–32)
CREATININE: 0.89 mg/dL (ref 0.61–1.24)
Chloride: 105 mmol/L (ref 98–111)
GFR calc Af Amer: >60 mL/min (ref >60)
GLUCOSE: 135 mg/dL — AB (ref 70–99)
POTASSIUM: 4.3 mmol/L (ref 3.5–5.1)
Sodium: 138 mmol/L (ref 135–145)
TOTAL PROTEIN: 6.8 g/dL (ref 6.5–8.1)

## 2018-10-14 LAB — LACTATE DEHYDROGENASE: LDH: 141 U/L (ref 98–192)

## 2018-10-21 DIAGNOSIS — Z79899 Other long term (current) drug therapy: Secondary | ICD-10-CM | POA: Diagnosis not present

## 2018-10-21 DIAGNOSIS — D693 Immune thrombocytopenic purpura: Secondary | ICD-10-CM | POA: Diagnosis not present

## 2018-10-21 DIAGNOSIS — E7800 Pure hypercholesterolemia, unspecified: Secondary | ICD-10-CM | POA: Diagnosis not present

## 2018-10-24 DIAGNOSIS — Z0000 Encounter for general adult medical examination without abnormal findings: Secondary | ICD-10-CM | POA: Diagnosis not present

## 2018-10-25 ENCOUNTER — Telehealth: Payer: Self-pay | Admitting: Pharmacist

## 2018-10-25 NOTE — Telephone Encounter (Signed)
Oral Chemotherapy Pharmacist Encounter  Follow-Up Form  Spoke to patient prior to his lab appt today for follow up regarding patient's oral chemotherapy medication: Promacta  Original Start date of oral chemotherapy: 10/2018  Pt reports 0 tablets/doses of Promacta missed in the last month.   Pt reports the following side effects: None reported  New medications?: None reported  Other Issues: None reported  Patient knows to call the office with questions or concerns. Oral Oncology Clinic will continue to follow.  Darl Pikes, PharmD, BCPS, [facility] Hematology/Oncology Clinical Pharmacist [facility] [place] [facility] [phone]  10/25/2018 11:55 AM

## 2018-10-28 ENCOUNTER — Inpatient Hospital Stay: Payer: PPO | Attending: Internal Medicine

## 2018-10-28 DIAGNOSIS — D591 Other autoimmune hemolytic anemias: Secondary | ICD-10-CM | POA: Insufficient documentation

## 2018-10-28 DIAGNOSIS — Z79899 Other long term (current) drug therapy: Secondary | ICD-10-CM | POA: Diagnosis not present

## 2018-10-28 DIAGNOSIS — D693 Immune thrombocytopenic purpura: Secondary | ICD-10-CM | POA: Insufficient documentation

## 2018-10-28 LAB — COMPREHENSIVE METABOLIC PANEL
ALBUMIN: 3.6 g/dL (ref 3.5–5.0)
ALT: 17 U/L (ref 0–44)
ANION GAP: 5 (ref 5–15)
AST: 21 U/L (ref 15–41)
Alkaline Phosphatase: 60 U/L (ref 38–126)
BUN: 17 mg/dL (ref 8–23)
CALCIUM: 8.5 mg/dL — AB (ref 8.9–10.3)
CO2: 27 mmol/L (ref 22–32)
Chloride: 104 mmol/L (ref 98–111)
Creatinine, Ser: 0.92 mg/dL (ref 0.61–1.24)
GFR calc Af Amer: >60 mL/min (ref >60)
GFR calc non Af Amer: >60 mL/min (ref >60)
GLUCOSE: 143 mg/dL — AB (ref 70–99)
POTASSIUM: 4.1 mmol/L (ref 3.5–5.1)
SODIUM: 136 mmol/L (ref 135–145)
TOTAL PROTEIN: 6.5 g/dL (ref 6.5–8.1)
Total Bilirubin: 0.7 mg/dL (ref 0.3–1.2)

## 2018-10-28 LAB — CBC WITH DIFFERENTIAL/PLATELET
Abs Immature Granulocytes: 0.02 10*3/uL (ref 0.00–0.07)
BASOS PCT: 0 %
Basophils Absolute: 0 10*3/uL (ref 0.0–0.1)
EOS ABS: 0.2 10*3/uL (ref 0.0–0.5)
EOS PCT: 2 %
HCT: 39.2 % (ref 39.0–52.0)
HEMOGLOBIN: 13 g/dL (ref 13.0–17.0)
Immature Granulocytes: 0 %
Lymphocytes Relative: 21 %
Lymphs Abs: 1.6 10*3/uL (ref 0.7–4.0)
MCH: 32.7 pg (ref 26.0–34.0)
MCHC: 33.2 g/dL (ref 30.0–36.0)
MCV: 98.5 fL (ref 80.0–100.0)
MONO ABS: 0.5 10*3/uL (ref 0.1–1.0)
Monocytes Relative: 7 %
Neutro Abs: 5.2 10*3/uL (ref 1.7–7.7)
Neutrophils Relative %: 70 %
Platelets: 191 10*3/uL (ref 150–400)
RBC: 3.98 MIL/uL — AB (ref 4.22–5.81)
RDW: 12.8 % (ref 11.5–15.5)
WBC: 7.5 10*3/uL (ref 4.0–10.5)
nRBC: 0 % (ref 0.0–0.2)

## 2018-10-28 LAB — LACTATE DEHYDROGENASE: LDH: 140 U/L (ref 98–192)

## 2018-10-29 DIAGNOSIS — D225 Melanocytic nevi of trunk: Secondary | ICD-10-CM | POA: Diagnosis not present

## 2018-10-29 DIAGNOSIS — X32XXXA Exposure to sunlight, initial encounter: Secondary | ICD-10-CM | POA: Diagnosis not present

## 2018-10-29 DIAGNOSIS — L570 Actinic keratosis: Secondary | ICD-10-CM | POA: Diagnosis not present

## 2018-10-29 DIAGNOSIS — D2272 Melanocytic nevi of left lower limb, including hip: Secondary | ICD-10-CM | POA: Diagnosis not present

## 2018-10-29 DIAGNOSIS — B353 Tinea pedis: Secondary | ICD-10-CM | POA: Diagnosis not present

## 2018-10-29 DIAGNOSIS — L821 Other seborrheic keratosis: Secondary | ICD-10-CM | POA: Diagnosis not present

## 2018-10-29 DIAGNOSIS — D2271 Melanocytic nevi of right lower limb, including hip: Secondary | ICD-10-CM | POA: Diagnosis not present

## 2018-10-29 DIAGNOSIS — D2261 Melanocytic nevi of right upper limb, including shoulder: Secondary | ICD-10-CM | POA: Diagnosis not present

## 2018-10-29 DIAGNOSIS — D2262 Melanocytic nevi of left upper limb, including shoulder: Secondary | ICD-10-CM | POA: Diagnosis not present

## 2018-10-29 MED FILL — PROMACTA 25 MG TABLET: 25 | 30 days supply | Qty: 30 | Fill #2

## 2018-11-11 ENCOUNTER — Inpatient Hospital Stay: Payer: PPO

## 2018-11-11 DIAGNOSIS — D693 Immune thrombocytopenic purpura: Secondary | ICD-10-CM | POA: Diagnosis not present

## 2018-11-11 LAB — CBC WITH DIFFERENTIAL/PLATELET
Abs Immature Granulocytes: 0.02 10*3/uL (ref 0.00–0.07)
Basophils Absolute: 0 10*3/uL (ref 0.0–0.1)
Basophils Relative: 0 %
Eosinophils Absolute: 0.2 10*3/uL (ref 0.0–0.5)
Eosinophils Relative: 3 %
HCT: 39.8 % (ref 39.0–52.0)
HEMOGLOBIN: 13.4 g/dL (ref 13.0–17.0)
Immature Granulocytes: 0 %
LYMPHS PCT: 19 %
Lymphs Abs: 1.6 10*3/uL (ref 0.7–4.0)
MCH: 32.8 pg (ref 26.0–34.0)
MCHC: 33.7 g/dL (ref 30.0–36.0)
MCV: 97.5 fL (ref 80.0–100.0)
Monocytes Absolute: 0.6 10*3/uL (ref 0.1–1.0)
Monocytes Relative: 8 %
NEUTROS ABS: 5.9 10*3/uL (ref 1.7–7.7)
Neutrophils Relative %: 70 %
Platelets: 214 10*3/uL (ref 150–400)
RBC: 4.08 MIL/uL — ABNORMAL LOW (ref 4.22–5.81)
RDW: 12.7 % (ref 11.5–15.5)
WBC: 8.4 10*3/uL (ref 4.0–10.5)
nRBC: 0 % (ref 0.0–0.2)

## 2018-11-11 LAB — COMPREHENSIVE METABOLIC PANEL
ALT: 19 U/L (ref 0–44)
AST: 21 U/L (ref 15–41)
Albumin: 3.8 g/dL (ref 3.5–5.0)
Alkaline Phosphatase: 63 U/L (ref 38–126)
Anion gap: 9 (ref 5–15)
BUN: 20 mg/dL (ref 8–23)
CO2: 25 mmol/L (ref 22–32)
Calcium: 8.7 mg/dL — ABNORMAL LOW (ref 8.9–10.3)
Chloride: 104 mmol/L (ref 98–111)
Creatinine, Ser: 0.86 mg/dL (ref 0.61–1.24)
GFR calc Af Amer: >60 mL/min (ref >60)
GFR calc non Af Amer: >60 mL/min (ref >60)
Glucose, Bld: 109 mg/dL — ABNORMAL HIGH (ref 70–99)
POTASSIUM: 4.1 mmol/L (ref 3.5–5.1)
Sodium: 138 mmol/L (ref 135–145)
Total Bilirubin: 0.5 mg/dL (ref 0.3–1.2)
Total Protein: 6.9 g/dL (ref 6.5–8.1)

## 2018-11-11 LAB — LACTATE DEHYDROGENASE: LDH: 137 U/L (ref 98–192)

## 2018-11-18 ENCOUNTER — Encounter: Payer: Self-pay | Admitting: Pharmacy Technician

## 2018-11-18 ENCOUNTER — Telehealth: Payer: Self-pay | Admitting: Pharmacy Technician

## 2018-11-18 NOTE — Telephone Encounter (Signed)
Oral Oncology Patient Advocate Encounter  Patient has been approved for assistance through [employer] ([employer]) from 11/20/18-11/20/19.  Patient will have to pay a copay of $10 and is aware.   Member ID: [id number] Group ID: [id number] PCN: [id number] BIN: [place] Patient [place] Phone [phone] Fax [phone] 11/18/2018 9:39 AM

## 2018-11-25 ENCOUNTER — Inpatient Hospital Stay: Payer: PPO | Attending: Internal Medicine

## 2018-11-25 DIAGNOSIS — Z79899 Other long term (current) drug therapy: Secondary | ICD-10-CM | POA: Insufficient documentation

## 2018-11-25 DIAGNOSIS — D693 Immune thrombocytopenic purpura: Secondary | ICD-10-CM | POA: Diagnosis not present

## 2018-11-25 DIAGNOSIS — I10 Essential (primary) hypertension: Secondary | ICD-10-CM | POA: Insufficient documentation

## 2018-11-25 DIAGNOSIS — Z8546 Personal history of malignant neoplasm of prostate: Secondary | ICD-10-CM | POA: Diagnosis not present

## 2018-11-25 DIAGNOSIS — D591 Other autoimmune hemolytic anemias: Secondary | ICD-10-CM | POA: Diagnosis not present

## 2018-11-25 DIAGNOSIS — R55 Syncope and collapse: Secondary | ICD-10-CM | POA: Diagnosis not present

## 2018-11-25 DIAGNOSIS — R42 Dizziness and giddiness: Secondary | ICD-10-CM | POA: Diagnosis not present

## 2018-11-25 LAB — LACTATE DEHYDROGENASE: LDH: 132 U/L (ref 98–192)

## 2018-11-25 LAB — COMPREHENSIVE METABOLIC PANEL
ALK PHOS: 63 U/L (ref 38–126)
ALT: 19 U/L (ref 0–44)
AST: 21 U/L (ref 15–41)
Albumin: 3.7 g/dL (ref 3.5–5.0)
Anion gap: 5 (ref 5–15)
BUN: 20 mg/dL (ref 8–23)
CALCIUM: 8.8 mg/dL — AB (ref 8.9–10.3)
CO2: 29 mmol/L (ref 22–32)
Chloride: 106 mmol/L (ref 98–111)
Creatinine, Ser: 0.95 mg/dL (ref 0.61–1.24)
GFR calc Af Amer: >60 mL/min (ref >60)
GFR calc non Af Amer: >60 mL/min (ref >60)
Glucose, Bld: 93 mg/dL (ref 70–99)
Potassium: 4.2 mmol/L (ref 3.5–5.1)
Sodium: 140 mmol/L (ref 135–145)
Total Bilirubin: 0.5 mg/dL (ref 0.3–1.2)
Total Protein: 6.7 g/dL (ref 6.5–8.1)

## 2018-11-25 LAB — CBC WITH DIFFERENTIAL/PLATELET
Abs Immature Granulocytes: 0.01 10*3/uL (ref 0.00–0.07)
Basophils Absolute: 0 10*3/uL (ref 0.0–0.1)
Basophils Relative: 1 %
Eosinophils Absolute: 0.2 10*3/uL (ref 0.0–0.5)
Eosinophils Relative: 2 %
HCT: 40.5 % (ref 39.0–52.0)
Hemoglobin: 13.2 g/dL (ref 13.0–17.0)
Immature Granulocytes: 0 %
LYMPHS PCT: 21 %
Lymphs Abs: 1.6 10*3/uL (ref 0.7–4.0)
MCH: 32.1 pg (ref 26.0–34.0)
MCHC: 32.6 g/dL (ref 30.0–36.0)
MCV: 98.5 fL (ref 80.0–100.0)
MONO ABS: 0.6 10*3/uL (ref 0.1–1.0)
Monocytes Relative: 8 %
Neutro Abs: 4.9 10*3/uL (ref 1.7–7.7)
Neutrophils Relative %: 68 %
Platelets: 212 10*3/uL (ref 150–400)
RBC: 4.11 MIL/uL — ABNORMAL LOW (ref 4.22–5.81)
RDW: 12.8 % (ref 11.5–15.5)
WBC: 7.3 10*3/uL (ref 4.0–10.5)
nRBC: 0 % (ref 0.0–0.2)

## 2018-12-05 DIAGNOSIS — H8112 Benign paroxysmal vertigo, left ear: Secondary | ICD-10-CM | POA: Diagnosis not present

## 2018-12-05 DIAGNOSIS — R42 Dizziness and giddiness: Secondary | ICD-10-CM | POA: Diagnosis not present

## 2018-12-09 ENCOUNTER — Other Ambulatory Visit: Payer: Self-pay

## 2018-12-09 ENCOUNTER — Inpatient Hospital Stay (HOSPITAL_BASED_OUTPATIENT_CLINIC_OR_DEPARTMENT_OTHER): Payer: PPO | Admitting: Internal Medicine

## 2018-12-09 ENCOUNTER — Inpatient Hospital Stay: Payer: PPO

## 2018-12-09 ENCOUNTER — Encounter: Payer: Self-pay | Admitting: Internal Medicine

## 2018-12-09 VITALS — BP 131/76 | HR 63 | Temp 96.3°F | Resp 18

## 2018-12-09 DIAGNOSIS — Z79899 Other long term (current) drug therapy: Secondary | ICD-10-CM

## 2018-12-09 DIAGNOSIS — D693 Immune thrombocytopenic purpura: Secondary | ICD-10-CM

## 2018-12-09 DIAGNOSIS — D591 Other autoimmune hemolytic anemias: Secondary | ICD-10-CM

## 2018-12-09 DIAGNOSIS — Z8546 Personal history of malignant neoplasm of prostate: Secondary | ICD-10-CM

## 2018-12-09 DIAGNOSIS — I10 Essential (primary) hypertension: Secondary | ICD-10-CM

## 2018-12-09 LAB — CBC WITH DIFFERENTIAL/PLATELET
Abs Immature Granulocytes: 0.01 10*3/uL (ref 0.00–0.07)
Basophils Absolute: 0 10*3/uL (ref 0.0–0.1)
Basophils Relative: 0 %
Eosinophils Absolute: 0.2 10*3/uL (ref 0.0–0.5)
Eosinophils Relative: 3 %
HCT: 42.5 % (ref 39.0–52.0)
Hemoglobin: 13.9 g/dL (ref 13.0–17.0)
IMMATURE GRANULOCYTES: 0 %
Lymphocytes Relative: 23 %
Lymphs Abs: 1.6 10*3/uL (ref 0.7–4.0)
MCH: 32.3 pg (ref 26.0–34.0)
MCHC: 32.7 g/dL (ref 30.0–36.0)
MCV: 98.8 fL (ref 80.0–100.0)
Monocytes Absolute: 0.6 10*3/uL (ref 0.1–1.0)
Monocytes Relative: 9 %
NEUTROS PCT: 65 %
Neutro Abs: 4.4 10*3/uL (ref 1.7–7.7)
Platelets: 246 10*3/uL (ref 150–400)
RBC: 4.3 MIL/uL (ref 4.22–5.81)
RDW: 13 % (ref 11.5–15.5)
WBC: 6.8 10*3/uL (ref 4.0–10.5)
nRBC: 0 % (ref 0.0–0.2)

## 2018-12-09 LAB — BASIC METABOLIC PANEL
ANION GAP: 7 (ref 5–15)
BUN: 18 mg/dL (ref 8–23)
CO2: 29 mmol/L (ref 22–32)
Calcium: 8.9 mg/dL (ref 8.9–10.3)
Chloride: 103 mmol/L (ref 98–111)
Creatinine, Ser: 0.88 mg/dL (ref 0.61–1.24)
GFR calc non Af Amer: >60 mL/min (ref >60)
Glucose, Bld: 93 mg/dL (ref 70–99)
Potassium: 4.2 mmol/L (ref 3.5–5.1)
Sodium: 139 mmol/L (ref 135–145)

## 2018-12-09 NOTE — Progress Notes (Signed)
[place] OFFICE PROGRESS NOTE  Patient Care Team: Keith Late, MD as PCP - General (Family Medicine)   SUMMARY OF ONCOLOGIC HISTORY:  #FEB 2016-  ITP  s/p Prednisone; OCT 2016- Relapse of ITP- Prednisone; JAN 13th- START RITUXAN q W x4 [finished Feb 8th]; March 10th 2017- N-plate q W- good response; Dec 2018- promacta 25 mg qOD.   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   # Hx of nose bleeds  INTERVAL HISTORY:  A very pleasant 85 year old male patient with a history of autoimmune hemolytic anemia/ITP is here for follow-up.  Patient denies any blood in stools or black or stools.  Denies any nosebleeds.  Appetite is good.  No weight loss.  Patient continues to be on CellCept and Promacta.  Review of Systems  Constitutional: Negative for chills, diaphoresis, fever, malaise/fatigue and weight loss.  HENT: Negative for nosebleeds and sore throat.   Eyes: Negative for double vision.  Respiratory: Negative for cough, hemoptysis, sputum production, shortness of breath and wheezing.   Cardiovascular: Negative for chest pain, palpitations, orthopnea and leg swelling.  Gastrointestinal: Negative for abdominal pain, blood in stool, constipation, diarrhea, heartburn, melena, nausea and vomiting.  Genitourinary: Negative for dysuria, frequency and urgency.  Musculoskeletal: Negative for back pain and joint pain.  Skin: Negative.  Negative for itching and rash.  Neurological: Negative for dizziness, tingling, focal weakness, weakness and headaches.  Endo/Heme/Allergies: Does not bruise/bleed easily.  Psychiatric/Behavioral: Negative for depression. The patient is not nervous/anxious and does not have insomnia.     PAST MEDICAL HISTORY :  Past Medical History:  Diagnosis Date  . Autoimmune hemolytic anemia (HCC)   . Dehydration   . Detached retina   . Hypercholesteremia   . Hypertension   . Insomnia   .  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 Leukocytosis   . Prostate cancer ([place])   . Shingles     PAST SURGICAL HISTORY :   Past Surgical History:  Procedure Laterality Date  . artificial left eye    . EYE SURGERY    . HERNIA REPAIR    . JOINT REPLACEMENT    . PROSTATE SURGERY      FAMILY HISTORY :   Family History  Problem Relation Age of Onset  . Cancer Mother   . Multiple sclerosis Father     SOCIAL HISTORY:   Social History   Tobacco Use  . Smoking status: Never Smoker  Substance Use Topics  . Alcohol use: No  . Drug use: No    ALLERGIES:  is allergic to cephalexin and hydrocodone-acetaminophen.  MEDICATIONS:  Current Outpatient Medications  Medication Sig Dispense Refill  . acetaminophen (TYLENOL) 500 MG tablet Take 500 mg by mouth every 6 (six) hours as needed for mild pain. Reported on 01/24/2016    . citalopram (CELEXA) 10 MG tablet Take 10 mg by mouth daily.    . Cyanocobalamin (RA VITAMIN B-12 TR) 1000 MCG TBCR Take 1,000 mcg by mouth daily.    . feeding supplement, ENSURE ENLIVE, (ENSURE ENLIVE) LIQD Take 237 mLs by mouth 2 (two) times daily between meals. 237 mL 12  . gabapentin (NEURONTIN) 100 MG capsule Take 100 mg by mouth at bedtime. Reported on 12/03/2015    . lisinopril (PRINIVIL,ZESTRIL) 5 MG tablet Take 10 mg by mouth daily.     . meclizine (ANTIVERT) 25 MG tablet Take 1 tablet by mouth as needed for dizziness.    Marland Kitchen  mycophenolate (CELLCEPT) 500 MG tablet TAKE TWO TABLETS EVERY MORNING AND TAKE ONE TABLET EVERY EVENING 90 tablet 3  . omeprazole (PRILOSEC) 40 MG capsule TAKE 1 CAPSULE BY MOUTH DAILY USUALLY 30 MINUTES BEFORE BREAKFAST 90 capsule 2  . polyethylene glycol (MIRALAX / GLYCOLAX) packet Take 17 g by mouth daily as needed for mild constipation. 14 each 0  . polyvinyl alcohol (LIQUIFILM TEARS) 1.4 % ophthalmic solution Place 2 drops into the left eye 3 (three) times daily as needed for dry eyes. 15 mL 0  . pravastatin (PRAVACHOL)  20 MG tablet Take 20 mg by mouth at bedtime.     Marland Kitchen PROMACTA 25 MG tablet TAKE 1 TABLET (25 MG TOTAL) BY MOUTH DAILY. TAKE ON AN EMPTY STOMACH 1 HOUR BEFORE A MEAL OR 2 HOURS AFTER (Patient taking differently: Take 25 mg by mouth every other day. ) 30 tablet 4   No current facility-administered medications for this visit.     PHYSICAL EXAMINATION:   BP 131/76   Pulse 63   Temp (!) 96.3 F (35.7 C) (Tympanic)   Resp 18   There were no vitals filed for this visit.  Physical Exam  Constitutional: He is oriented to person, place, and time and well-developed, well-nourished, and in no distress.  HENT:  Head: Normocephalic and atraumatic.  Mouth/Throat: Oropharynx is clear and moist. No oropharyngeal exudate.  Eyes: Pupils are equal, round, and reactive to light.  Neck: Normal range of motion. Neck supple.  Cardiovascular: Normal rate and regular rhythm.  Pulmonary/Chest: No respiratory distress. He has no wheezes.  Abdominal: Soft. Bowel sounds are normal. He exhibits no distension and no mass. There is no abdominal tenderness. There is no rebound and no guarding.  Musculoskeletal: Normal range of motion.        General: No tenderness or edema.  Neurological: He is alert and oriented to person, place, and time.  Skin: Skin is warm.  Psychiatric: Affect normal.     LABORATORY DATA:  I have reviewed the data as listed    Component Value Date/Time   NA 139 12/09/2018 1406   NA 131 (L) 03/17/2015 1425   K 4.2 12/09/2018 1406   K 3.9 03/17/2015 1425   CL 103 12/09/2018 1406   CL 101 03/17/2015 1425   CO2 29 12/09/2018 1406   CO2 26 03/17/2015 1425   GLUCOSE 93 12/09/2018 1406   GLUCOSE 161 (H) 03/17/2015 1425   BUN 18 12/09/2018 1406   BUN 17 03/17/2015 1425   CREATININE 0.88 12/09/2018 1406   CREATININE 0.90 03/17/2015 1425   CALCIUM 8.9 12/09/2018 1406   CALCIUM 8.5 (L) 03/17/2015 1425   PROT 6.7 11/25/2018 1333   PROT 6.7 03/17/2015 1425   ALBUMIN 3.7 11/25/2018 1333    ALBUMIN 3.7 03/17/2015 1425   AST 21 11/25/2018 1333   AST 25 03/17/2015 1425   ALT 19 11/25/2018 1333   ALT 23 03/17/2015 1425   ALKPHOS 63 11/25/2018 1333   ALKPHOS 44 03/17/2015 1425   BILITOT 0.5 11/25/2018 1333   BILITOT 0.6 03/17/2015 1425   GFRNONAA >60 12/09/2018 1406   GFRNONAA >60 03/17/2015 1425   GFRAA >60 12/09/2018 1406   GFRAA >60 03/17/2015 1425    No results found for: SPEP, UPEP  Lab Results  Component Value Date   WBC 6.8 12/09/2018   NEUTROABS 4.4 12/09/2018   HGB 13.9 12/09/2018   HCT 42.5 12/09/2018   MCV 98.8 12/09/2018   PLT 246 12/09/2018  Chemistry      Component Value Date/Time   NA 139 12/09/2018 1406   NA 131 (L) 03/17/2015 1425   K 4.2 12/09/2018 1406   K 3.9 03/17/2015 1425   CL 103 12/09/2018 1406   CL 101 03/17/2015 1425   CO2 29 12/09/2018 1406   CO2 26 03/17/2015 1425   BUN 18 12/09/2018 1406   BUN 17 03/17/2015 1425   CREATININE 0.88 12/09/2018 1406   CREATININE 0.90 03/17/2015 1425      Component Value Date/Time   CALCIUM 8.9 12/09/2018 1406   CALCIUM 8.5 (L) 03/17/2015 1425   ALKPHOS 63 11/25/2018 1333   ALKPHOS 44 03/17/2015 1425   AST 21 11/25/2018 1333   AST 25 03/17/2015 1425   ALT 19 11/25/2018 1333   ALT 23 03/17/2015 1425   BILITOT 0.5 11/25/2018 1333   BILITOT 0.6 03/17/2015 1425       ASSESSMENT & PLAN:   Idiopathic thrombocytopenic purpura ([place]) #ITP relapsed; currently on Promacta 25 mg- M-F.  Platelets 246. Continue Promacta; but space out to 25 mg every other day. STABLE.   #Hemolytic anemia hemoglobin 13/LDH normal.  Continue CellCept 1000 milligrams in the morning 500 mg at night STABLE.   # DISPOSITION: # labs q 4 weeks- cbc;  # follow up in 4 months/labs-cbc/cmp- Dr.B      [name], MD 12/09/2018 2:45 PM

## 2018-12-09 NOTE — Assessment & Plan Note (Addendum)
#  ITP relapsed; currently on Promacta 25 mg- M-F.  Platelets 246. Continue Promacta; but space out to 25 mg every other day. STABLE.   #Hemolytic anemia hemoglobin 13/LDH normal.  Continue CellCept 1000 milligrams in the morning 500 mg at night STABLE.   # DISPOSITION: # labs q 4 weeks- cbc;  # follow up in 4 months/labs-cbc/cmp- Dr.B

## 2018-12-10 DIAGNOSIS — R55 Syncope and collapse: Secondary | ICD-10-CM | POA: Diagnosis not present

## 2018-12-10 DIAGNOSIS — I6523 Occlusion and stenosis of bilateral carotid arteries: Secondary | ICD-10-CM | POA: Diagnosis not present

## 2018-12-10 DIAGNOSIS — R42 Dizziness and giddiness: Secondary | ICD-10-CM | POA: Diagnosis not present

## 2018-12-16 DIAGNOSIS — H8110 Benign paroxysmal vertigo, unspecified ear: Secondary | ICD-10-CM | POA: Diagnosis not present

## 2018-12-17 DIAGNOSIS — H40051 Ocular hypertension, right eye: Secondary | ICD-10-CM | POA: Diagnosis not present

## 2018-12-25 DIAGNOSIS — D693 Immune thrombocytopenic purpura: Secondary | ICD-10-CM | POA: Diagnosis not present

## 2018-12-25 DIAGNOSIS — I10 Essential (primary) hypertension: Secondary | ICD-10-CM | POA: Diagnosis not present

## 2018-12-25 DIAGNOSIS — R55 Syncope and collapse: Secondary | ICD-10-CM | POA: Diagnosis not present

## 2018-12-30 MED FILL — PROMACTA 25 MG TABLET: 25 | 30 days supply | Qty: 30 | Fill #3

## 2019-01-06 ENCOUNTER — Inpatient Hospital Stay: Payer: PPO | Attending: Internal Medicine

## 2019-01-06 ENCOUNTER — Other Ambulatory Visit: Payer: Self-pay | Admitting: Internal Medicine

## 2019-01-06 DIAGNOSIS — D693 Immune thrombocytopenic purpura: Secondary | ICD-10-CM

## 2019-01-06 DIAGNOSIS — D591 Other autoimmune hemolytic anemias: Secondary | ICD-10-CM | POA: Diagnosis not present

## 2019-01-06 LAB — COMPREHENSIVE METABOLIC PANEL
ALK PHOS: 67 U/L (ref 38–126)
ALT: 18 U/L (ref 0–44)
AST: 21 U/L (ref 15–41)
Albumin: 3.7 g/dL (ref 3.5–5.0)
Anion gap: 5 (ref 5–15)
BILIRUBIN TOTAL: 0.5 mg/dL (ref 0.3–1.2)
BUN: 16 mg/dL (ref 8–23)
CALCIUM: 8.7 mg/dL — AB (ref 8.9–10.3)
CO2: 28 mmol/L (ref 22–32)
Chloride: 105 mmol/L (ref 98–111)
Creatinine, Ser: 0.87 mg/dL (ref 0.61–1.24)
GFR calc Af Amer: >60 mL/min (ref >60)
GFR calc non Af Amer: >60 mL/min (ref >60)
Glucose, Bld: 98 mg/dL (ref 70–99)
Potassium: 4.3 mmol/L (ref 3.5–5.1)
Sodium: 138 mmol/L (ref 135–145)
TOTAL PROTEIN: 6.9 g/dL (ref 6.5–8.1)

## 2019-01-06 LAB — CBC WITH DIFFERENTIAL/PLATELET
Abs Immature Granulocytes: 0.02 10*3/uL (ref 0.00–0.07)
Basophils Absolute: 0 10*3/uL (ref 0.0–0.1)
Basophils Relative: 0 %
Eosinophils Absolute: 0.2 10*3/uL (ref 0.0–0.5)
Eosinophils Relative: 2 %
HCT: 40.3 % (ref 39.0–52.0)
Hemoglobin: 13.3 g/dL (ref 13.0–17.0)
Immature Granulocytes: 0 %
Lymphocytes Relative: 22 %
Lymphs Abs: 1.5 10*3/uL (ref 0.7–4.0)
MCH: 32.8 pg (ref 26.0–34.0)
MCHC: 33 g/dL (ref 30.0–36.0)
MCV: 99.3 fL (ref 80.0–100.0)
Monocytes Absolute: 0.6 10*3/uL (ref 0.1–1.0)
Monocytes Relative: 8 %
Neutro Abs: 4.7 10*3/uL (ref 1.7–7.7)
Neutrophils Relative %: 68 %
Platelets: 241 10*3/uL (ref 150–400)
RBC: 4.06 MIL/uL — ABNORMAL LOW (ref 4.22–5.81)
RDW: 12.9 % (ref 11.5–15.5)
WBC: 7 10*3/uL (ref 4.0–10.5)
nRBC: 0 % (ref 0.0–0.2)

## 2019-01-06 NOTE — Telephone Encounter (Signed)
(...   Ref Range & Units 4wk ago (12/09/18) 110mo ago (11/25/18) 65mo ago (11/11/18) 76mo ago (10/28/18) 71mo ago (10/14/18) 102mo ago (09/30/18) 58mo ago (09/02/18)  Sodium 135 - 145 mmol/L 139  140  138  136  138  135  136   Potassium 3.5 - 5.1 mmol/L 4.2  4.2  4.1  4.1  4.3  3.9  4.1   Chloride 98 - 111 mmol/L 103  106  104  104  105  102  105   CO2 22 - 32 mmol/L 29  29  25  27  27  26  25    Glucose, Bld 70 - 99 mg/dL 93  93  109High   143High   135High   115High   112High    BUN 8 - 23 mg/dL 18  20  20  17  16  16  18    Creatinine, Ser 0.61 - 1.24 mg/dL 0.88  0.95  0.86  0.92  0.89  0.97  0.87   Calcium 8.9 - 10.3 mg/dL 8.9  8.8Low   8.7Low   8.5Low   8.9  8.8Low   8.9   GFR calc non Af Amer >60 mL/min >60  >60  >60  >60  >60  >60  >60   GFR calc Af Amer >60 mL/min >60  >60  >60  >60  >60 CM >60 CM >60 CM  Anion gap 5 - 15 7  5  CM 9 CM 5 CM 6 CM 7 CM 6 CM  Comment: Performed at [facility], [address] [place], [place] [address]  Resulting Agency  [facility] CL[facility] LAB [place] CLIN LAB [place] CL[facility] LAB [place] CL[facility] LAB [place] CL[facility] LAB [place] [facility] [place] [facility]      Specimen Collected: 12/09/18 14:06  Last Resulted: 12/09/18 14:23     Lab Flowsheet    Order Details    View Encounter    Lab and Collection Details    Routing    Result History      CM=Additional comments      Other Results from 12/09/2018   CBC with Differential/Platelet  Order: [id number]   Status:  Final result  Visible to patient:  No (Not Released)  Next appt:  Today at 01:45 PM in Oncology ([facility])  Dx:  Idiopathic thrombocytopenic purpura (...   Ref Range & Units 4wk ago (12/09/18) 24mo ago (11/25/18) 36mo ago (11/11/18) 87mo ago (10/28/18) 11mo ago (10/14/18) 10mo ago (09/30/18) 70mo ago (09/16/18)  WBC 4.0 - 10.5 K/uL 6.8  7.3  8.4  7.5  8.2  7.3  8.3   RBC 4.22 - 5.81 MIL/uL 4.30  4.11Low   4.08Low   3.98Low   3.93Low   4.04Low   3.99Low    Hemoglobin 13.0 - 17.0 g/dL 13.9  13.2  13.4  13.0  13.0  13.1  12.9Low     HCT 39.0 - 52.0 % 42.5  40.5  39.8  39.2  38.9Low   39.7  39.3   MCV 80.0 - 100.0 fL 98.8  98.5  97.5  98.5  99.0  98.3  98.5   MCH 26.0 - 34.0 pg 32.3  32.1  32.8  32.7  33.1  32.4  32.3   MCHC 30.0 - 36.0 g/dL 32.7  32.6  33.7  33.2  33.4  33.0  32.8   RDW 11.5 - 15.5 % 13.0  12.8  12.7  12.8  12.9  13.0  12.8  Platelets 150 - 400 K/uL 246  212  214  191  259  238  228   nRBC 0.0 - 0.2 % 0.0  0.0  0.0  0.0  0.0  0.0  0.0   Neutrophils Relative % % 65  68  70  70  73  67  69   Neutro Abs 1.7 - 7.7 K/uL 4.4  4.9  5.9  5.2  6.0  5.0  5.7   Lymphocytes Relative % 23  21  19  21  17  21  20    Lymphs Abs 0.7 - 4.0 K/uL 1.6  1.6  1.6  1.6  1.4  1.5  1.6   Monocytes Relative % 9  8  8  7  7  9  8    Monocytes Absolute 0.1 - 1.0 K/uL 0.6  0.6  0.6  0.5  0.6  0.6  0.7   Eosinophils Relative % 3  2  3  2  2  3  2    Eosinophils Absolute 0.0 - 0.5 K/uL 0.2  0.2  0.2  0.2  0.2  0.2  0.2   Basophils Relative % 0  1  0  0  1  0  1   Basophils Absolute 0.0 - 0.1 K/uL 0.0  0.0  0.0  0.0  0.0  0.0  0.0   Immature Granulocytes % 0  0  0  0  0  0  0   Abs Immature Granulocytes 0.00 - 0.07 K/uL 0.01  0.01 CM 0.02 CM 0.02 CM 0.02 CM 0.02 CM 0.02 CM  Comment: Performed at [facility], [address] [place], [place] [address]  Resulting Agency  [facility] CLIN LAB [place] CLIN LAB [place] CLIN LAB [place] [facility]IN LAB [place] CLIN LAB [place] CLIN LAB [place] [facility] LAB      Specimen Collected: 12/09/18 14:06  Last Resulted: 12/09/18 14:16

## 2019-01-23 ENCOUNTER — Telehealth: Payer: Self-pay | Admitting: Pharmacist

## 2019-01-23 DIAGNOSIS — D693 Immune thrombocytopenic purpura: Secondary | ICD-10-CM

## 2019-01-23 MED ORDER — ELTROMBOPAG OLAMINE 25 MG PO TABS: 25.0000 mg | ORAL_TABLET | ORAL | 4 refills | Status: DC

## 2019-01-23 NOTE — Telephone Encounter (Signed)
Oral Chemotherapy Pharmacist Encounter  Follow-Up Form  Spoke to patient prior to his lab appt today for follow up regarding patient's oral chemotherapy medication:Promacta  Original Start date: 10/2018  Pt reports0tablets/doses ofPromactamissed in the lastmonth.   Pt reports the following side effects:None reported  Recent labs reviewed: CBC from 01/06/2019  New medications?:None reported  Other Issues:None reported  Patient knows to call the office with questions or concerns. Oral Oncology Clinic will continue to follow.  Darl Pikes, PharmD, BCPS, [facility] Hematology/Oncology Clinical Pharmacist [facility] [place] [facility] [phone]  01/23/2019 12:31 PM

## 2019-01-27 DIAGNOSIS — H1033 Unspecified acute conjunctivitis, bilateral: Secondary | ICD-10-CM | POA: Diagnosis not present

## 2019-01-30 DIAGNOSIS — R6889 Other general symptoms and signs: Secondary | ICD-10-CM | POA: Diagnosis not present

## 2019-01-30 DIAGNOSIS — D591 Other autoimmune hemolytic anemias: Secondary | ICD-10-CM | POA: Diagnosis not present

## 2019-01-30 DIAGNOSIS — D693 Immune thrombocytopenic purpura: Secondary | ICD-10-CM | POA: Diagnosis not present

## 2019-01-30 DIAGNOSIS — J069 Acute upper respiratory infection, unspecified: Secondary | ICD-10-CM | POA: Diagnosis not present

## 2019-01-30 DIAGNOSIS — H40051 Ocular hypertension, right eye: Secondary | ICD-10-CM | POA: Diagnosis not present

## 2019-02-01 DIAGNOSIS — J181 Lobar pneumonia, unspecified organism: Secondary | ICD-10-CM | POA: Diagnosis not present

## 2019-02-01 DIAGNOSIS — R6889 Other general symptoms and signs: Secondary | ICD-10-CM | POA: Diagnosis not present

## 2019-02-01 DIAGNOSIS — R05 Cough: Secondary | ICD-10-CM | POA: Diagnosis not present

## 2019-02-01 DIAGNOSIS — R509 Fever, unspecified: Secondary | ICD-10-CM | POA: Diagnosis not present

## 2019-02-01 DIAGNOSIS — J9801 Acute bronchospasm: Secondary | ICD-10-CM | POA: Diagnosis not present

## 2019-02-03 ENCOUNTER — Telehealth: Payer: Self-pay | Admitting: *Deleted

## 2019-02-03 ENCOUNTER — Inpatient Hospital Stay: Payer: PPO

## 2019-02-03 NOTE — Telephone Encounter (Signed)
Doni- please ask pt to re-check labs as planned in April. appt with me as planned.  GB

## 2019-02-03 NOTE — Telephone Encounter (Signed)
Patient's wife [name] called to say that he will not be able to come in today for his lab appointment because he is at home with pneumonia. He was diagnosed over the weekend at urgent care, and he is on a lot of medication, including prednisone. His platelet count was 252 on Saturday at urgent care. When should he come back here to be re-checked? Please let her know.

## 2019-02-03 NOTE — Telephone Encounter (Signed)
Patient's wife notified of Dr. Sharmaine Base recommendations and verbalized understanding.

## 2019-02-20 MED FILL — PROMACTA 25 MG TABLET: 25 | 30 days supply | Qty: 15 | Fill #0

## 2019-03-02 ENCOUNTER — Other Ambulatory Visit: Payer: Self-pay

## 2019-03-03 ENCOUNTER — Other Ambulatory Visit: Payer: Self-pay

## 2019-03-03 ENCOUNTER — Inpatient Hospital Stay: Payer: PPO | Attending: Internal Medicine

## 2019-03-03 DIAGNOSIS — D693 Immune thrombocytopenic purpura: Secondary | ICD-10-CM | POA: Insufficient documentation

## 2019-03-03 DIAGNOSIS — D591 Other autoimmune hemolytic anemias: Secondary | ICD-10-CM | POA: Insufficient documentation

## 2019-03-03 LAB — CBC WITH DIFFERENTIAL/PLATELET
Abs Immature Granulocytes: 0.02 10*3/uL (ref 0.00–0.07)
Basophils Absolute: 0 10*3/uL (ref 0.0–0.1)
Basophils Relative: 0 %
Eosinophils Absolute: 0.3 10*3/uL (ref 0.0–0.5)
Eosinophils Relative: 5 %
HCT: 39.6 % (ref 39.0–52.0)
Hemoglobin: 13.1 g/dL (ref 13.0–17.0)
Immature Granulocytes: 0 %
Lymphocytes Relative: 24 %
Lymphs Abs: 1.7 10*3/uL (ref 0.7–4.0)
MCH: 32.8 pg (ref 26.0–34.0)
MCHC: 33.1 g/dL (ref 30.0–36.0)
MCV: 99.2 fL (ref 80.0–100.0)
Monocytes Absolute: 0.7 10*3/uL (ref 0.1–1.0)
Monocytes Relative: 10 %
Neutro Abs: 4.3 10*3/uL (ref 1.7–7.7)
Neutrophils Relative %: 61 %
Platelets: 248 10*3/uL (ref 150–400)
RBC: 3.99 MIL/uL — ABNORMAL LOW (ref 4.22–5.81)
RDW: 13.5 % (ref 11.5–15.5)
WBC: 7.1 10*3/uL (ref 4.0–10.5)
nRBC: 0 % (ref 0.0–0.2)

## 2019-03-03 LAB — COMPREHENSIVE METABOLIC PANEL
ALT: 24 U/L (ref 0–44)
AST: 24 U/L (ref 15–41)
Albumin: 3.8 g/dL (ref 3.5–5.0)
Alkaline Phosphatase: 69 U/L (ref 38–126)
Anion gap: 8 (ref 5–15)
BUN: 13 mg/dL (ref 8–23)
CO2: 26 mmol/L (ref 22–32)
Calcium: 8.8 mg/dL — ABNORMAL LOW (ref 8.9–10.3)
Chloride: 101 mmol/L (ref 98–111)
Creatinine, Ser: 0.85 mg/dL (ref 0.61–1.24)
GFR calc Af Amer: >60 mL/min (ref >60)
GFR calc non Af Amer: >60 mL/min (ref >60)
Glucose, Bld: 111 mg/dL — ABNORMAL HIGH (ref 70–99)
Potassium: 3.8 mmol/L (ref 3.5–5.1)
Sodium: 135 mmol/L (ref 135–145)
Total Bilirubin: 0.7 mg/dL (ref 0.3–1.2)
Total Protein: 6.8 g/dL (ref 6.5–8.1)

## 2019-03-17 MED FILL — PROMACTA 25 MG TABLET: 25 | 30 days supply | Qty: 15 | Fill #1

## 2019-03-31 ENCOUNTER — Inpatient Hospital Stay: Payer: PPO | Attending: Internal Medicine

## 2019-03-31 ENCOUNTER — Other Ambulatory Visit: Payer: Self-pay

## 2019-03-31 ENCOUNTER — Encounter: Payer: Self-pay | Admitting: Internal Medicine

## 2019-03-31 ENCOUNTER — Inpatient Hospital Stay (HOSPITAL_BASED_OUTPATIENT_CLINIC_OR_DEPARTMENT_OTHER): Payer: PPO | Admitting: Internal Medicine

## 2019-03-31 DIAGNOSIS — D693 Immune thrombocytopenic purpura: Secondary | ICD-10-CM

## 2019-03-31 DIAGNOSIS — D589 Hereditary hemolytic anemia, unspecified: Secondary | ICD-10-CM | POA: Insufficient documentation

## 2019-03-31 LAB — CBC WITH DIFFERENTIAL/PLATELET
Abs Immature Granulocytes: 0.02 10*3/uL (ref 0.00–0.07)
Basophils Absolute: 0.1 10*3/uL (ref 0.0–0.1)
Basophils Relative: 1 %
Eosinophils Absolute: 0.4 10*3/uL (ref 0.0–0.5)
Eosinophils Relative: 5 %
HCT: 39.4 % (ref 39.0–52.0)
Hemoglobin: 13.4 g/dL (ref 13.0–17.0)
Immature Granulocytes: 0 %
Lymphocytes Relative: 21 %
Lymphs Abs: 1.6 10*3/uL (ref 0.7–4.0)
MCH: 33.8 pg (ref 26.0–34.0)
MCHC: 34 g/dL (ref 30.0–36.0)
MCV: 99.5 fL (ref 80.0–100.0)
Monocytes Absolute: 0.7 10*3/uL (ref 0.1–1.0)
Monocytes Relative: 9 %
Neutro Abs: 4.9 10*3/uL (ref 1.7–7.7)
Neutrophils Relative %: 64 %
Platelets: 318 10*3/uL (ref 150–400)
RBC: 3.96 MIL/uL — ABNORMAL LOW (ref 4.22–5.81)
RDW: 13.7 % (ref 11.5–15.5)
WBC: 7.6 10*3/uL (ref 4.0–10.5)
nRBC: 0 % (ref 0.0–0.2)

## 2019-03-31 LAB — COMPREHENSIVE METABOLIC PANEL
ALT: 16 U/L (ref 0–44)
AST: 22 U/L (ref 15–41)
Albumin: 3.8 g/dL (ref 3.5–5.0)
Alkaline Phosphatase: 58 U/L (ref 38–126)
Anion gap: 9 (ref 5–15)
BUN: 14 mg/dL (ref 8–23)
CO2: 25 mmol/L (ref 22–32)
Calcium: 8.7 mg/dL — ABNORMAL LOW (ref 8.9–10.3)
Chloride: 102 mmol/L (ref 98–111)
Creatinine, Ser: 0.94 mg/dL (ref 0.61–1.24)
GFR calc Af Amer: >60 mL/min (ref >60)
GFR calc non Af Amer: >60 mL/min (ref >60)
Glucose, Bld: 83 mg/dL (ref 70–99)
Potassium: 3.6 mmol/L (ref 3.5–5.1)
Sodium: 136 mmol/L (ref 135–145)
Total Bilirubin: 0.5 mg/dL (ref 0.3–1.2)
Total Protein: 6.7 g/dL (ref 6.5–8.1)

## 2019-03-31 NOTE — Progress Notes (Signed)
I connected with [name]. on 03/31/19 at  3:15 PM EDT by video enabled telemedicine visit and verified that I am speaking with the correct person using two identifiers.  I discussed the limitations, risks, security and privacy concerns of performing an evaluation and management service by telemedicine and the availability of in-person appointments. I also discussed with the patient that there may be a patient responsible charge related to this service. The patient expressed understanding and agreed to proceed.    Other persons participating in the visit and their role in the encounter: wife/ coordination of care  Patient's location: home  Provider's location: office    No history exists.     Chief Complaint: ITP/AIHA    History of present illness:[name]. 85 y.o.  male with history of ITP/autoimmune hemolytic anemia currently on Promacta/CellCept.  Denies any easy bruising or denies any unusual fatigue denies any blood in stools or black or stools.  Appetite is good.  No nausea no vomiting.  Observation/objective:  Assessment and plan: Idiopathic thrombocytopenic purpura ([place]) #ITP relapsed; currently on Promacta 25 mg every other day. Platelets 318 Continue Promacta; but space out to  STABLE.   #Hemolytic anemia hemoglobin 13/LDH normal.  Continue CellCept 1000 milligrams in the morning 500 mg at night STABLE.   # DISPOSITION: # labs q 4 weeks- cbc;  # follow up in 4 months/labs-cbc/cmp- Dr.B     Follow-up instructions:  I discussed the assessment and treatment plan with the patient.  The patient was provided an opportunity to ask questions and all were answered.  The patient agreed with the plan and demonstrated understanding of instructions.  The patient was advised to call back or seek an in person evaluation if the symptoms worsen or if the condition fails to improve as anticipated.   Dr. Charlaine Dalton [facility] at [facility] 03/31/2019 4:12 PM

## 2019-03-31 NOTE — Assessment & Plan Note (Signed)
#  ITP relapsed; currently on Promacta 25 mg every other day. Platelets 318 Continue Promacta; but space out to  STABLE.   #Hemolytic anemia hemoglobin 13/LDH normal.  Continue CellCept 1000 milligrams in the morning 500 mg at night STABLE.   # DISPOSITION: # labs q 4 weeks- cbc;  # follow up in 4 months/labs-cbc/cmp- Dr.B

## 2019-04-07 ENCOUNTER — Inpatient Hospital Stay: Payer: PPO

## 2019-04-07 ENCOUNTER — Other Ambulatory Visit: Payer: Self-pay

## 2019-04-08 ENCOUNTER — Other Ambulatory Visit: Payer: Self-pay

## 2019-04-08 ENCOUNTER — Inpatient Hospital Stay: Payer: PPO

## 2019-04-08 DIAGNOSIS — D693 Immune thrombocytopenic purpura: Secondary | ICD-10-CM

## 2019-04-08 LAB — CBC WITH DIFFERENTIAL/PLATELET
Abs Immature Granulocytes: 0.02 10*3/uL (ref 0.00–0.07)
Basophils Absolute: 0 10*3/uL (ref 0.0–0.1)
Basophils Relative: 0 %
Eosinophils Absolute: 0.3 10*3/uL (ref 0.0–0.5)
Eosinophils Relative: 4 %
HCT: 40.8 % (ref 39.0–52.0)
Hemoglobin: 13.6 g/dL (ref 13.0–17.0)
Immature Granulocytes: 0 %
Lymphocytes Relative: 15 %
Lymphs Abs: 1.1 10*3/uL (ref 0.7–4.0)
MCH: 32.9 pg (ref 26.0–34.0)
MCHC: 33.3 g/dL (ref 30.0–36.0)
MCV: 98.6 fL (ref 80.0–100.0)
Monocytes Absolute: 0.6 10*3/uL (ref 0.1–1.0)
Monocytes Relative: 8 %
Neutro Abs: 5.2 10*3/uL (ref 1.7–7.7)
Neutrophils Relative %: 73 %
Platelets: 335 10*3/uL (ref 150–400)
RBC: 4.14 MIL/uL — ABNORMAL LOW (ref 4.22–5.81)
RDW: 13.7 % (ref 11.5–15.5)
WBC: 7.2 10*3/uL (ref 4.0–10.5)
nRBC: 0 % (ref 0.0–0.2)

## 2019-04-08 LAB — COMPREHENSIVE METABOLIC PANEL
ALT: 17 U/L (ref 0–44)
AST: 23 U/L (ref 15–41)
Albumin: 3.8 g/dL (ref 3.5–5.0)
Alkaline Phosphatase: 62 U/L (ref 38–126)
Anion gap: 7 (ref 5–15)
BUN: 14 mg/dL (ref 8–23)
CO2: 25 mmol/L (ref 22–32)
Calcium: 8.7 mg/dL — ABNORMAL LOW (ref 8.9–10.3)
Chloride: 104 mmol/L (ref 98–111)
Creatinine, Ser: 0.79 mg/dL (ref 0.61–1.24)
GFR calc Af Amer: >60 mL/min (ref >60)
GFR calc non Af Amer: >60 mL/min (ref >60)
Glucose, Bld: 106 mg/dL — ABNORMAL HIGH (ref 70–99)
Potassium: 4.2 mmol/L (ref 3.5–5.1)
Sodium: 136 mmol/L (ref 135–145)
Total Bilirubin: 0.6 mg/dL (ref 0.3–1.2)
Total Protein: 6.9 g/dL (ref 6.5–8.1)

## 2019-04-15 ENCOUNTER — Other Ambulatory Visit: Payer: Self-pay

## 2019-04-15 ENCOUNTER — Inpatient Hospital Stay: Payer: PPO

## 2019-04-15 DIAGNOSIS — R918 Other nonspecific abnormal finding of lung field: Secondary | ICD-10-CM | POA: Diagnosis not present

## 2019-04-15 DIAGNOSIS — D693 Immune thrombocytopenic purpura: Secondary | ICD-10-CM

## 2019-04-15 DIAGNOSIS — Z79899 Other long term (current) drug therapy: Secondary | ICD-10-CM | POA: Diagnosis not present

## 2019-04-15 DIAGNOSIS — Z8701 Personal history of pneumonia (recurrent): Secondary | ICD-10-CM | POA: Diagnosis not present

## 2019-04-15 DIAGNOSIS — J181 Lobar pneumonia, unspecified organism: Secondary | ICD-10-CM | POA: Diagnosis not present

## 2019-04-15 DIAGNOSIS — E7800 Pure hypercholesterolemia, unspecified: Secondary | ICD-10-CM | POA: Diagnosis not present

## 2019-04-15 LAB — COMPREHENSIVE METABOLIC PANEL
ALT: 15 U/L (ref 0–44)
AST: 21 U/L (ref 15–41)
Albumin: 3.8 g/dL (ref 3.5–5.0)
Alkaline Phosphatase: 58 U/L (ref 38–126)
Anion gap: 7 (ref 5–15)
BUN: 16 mg/dL (ref 8–23)
CO2: 26 mmol/L (ref 22–32)
Calcium: 8.5 mg/dL — ABNORMAL LOW (ref 8.9–10.3)
Chloride: 101 mmol/L (ref 98–111)
Creatinine, Ser: 0.81 mg/dL (ref 0.61–1.24)
GFR calc Af Amer: >60 mL/min (ref >60)
GFR calc non Af Amer: >60 mL/min (ref >60)
Glucose, Bld: 105 mg/dL — ABNORMAL HIGH (ref 70–99)
Potassium: 3.9 mmol/L (ref 3.5–5.1)
Sodium: 134 mmol/L — ABNORMAL LOW (ref 135–145)
Total Bilirubin: 0.7 mg/dL (ref 0.3–1.2)
Total Protein: 6.7 g/dL (ref 6.5–8.1)

## 2019-04-15 LAB — CBC WITH DIFFERENTIAL/PLATELET
Abs Immature Granulocytes: 0.02 10*3/uL (ref 0.00–0.07)
Basophils Absolute: 0 10*3/uL (ref 0.0–0.1)
Basophils Relative: 1 %
Eosinophils Absolute: 0.2 10*3/uL (ref 0.0–0.5)
Eosinophils Relative: 2 %
HCT: 38.9 % — ABNORMAL LOW (ref 39.0–52.0)
Hemoglobin: 13 g/dL (ref 13.0–17.0)
Immature Granulocytes: 0 %
Lymphocytes Relative: 23 %
Lymphs Abs: 1.7 10*3/uL (ref 0.7–4.0)
MCH: 33 pg (ref 26.0–34.0)
MCHC: 33.4 g/dL (ref 30.0–36.0)
MCV: 98.7 fL (ref 80.0–100.0)
Monocytes Absolute: 0.6 10*3/uL (ref 0.1–1.0)
Monocytes Relative: 8 %
Neutro Abs: 5.1 10*3/uL (ref 1.7–7.7)
Neutrophils Relative %: 66 %
Platelets: 310 10*3/uL (ref 150–400)
RBC: 3.94 MIL/uL — ABNORMAL LOW (ref 4.22–5.81)
RDW: 13.7 % (ref 11.5–15.5)
WBC: 7.6 10*3/uL (ref 4.0–10.5)
nRBC: 0 % (ref 0.0–0.2)

## 2019-04-16 ENCOUNTER — Other Ambulatory Visit: Payer: Self-pay

## 2019-04-17 DIAGNOSIS — K219 Gastro-esophageal reflux disease without esophagitis: Secondary | ICD-10-CM | POA: Diagnosis not present

## 2019-04-17 DIAGNOSIS — D693 Immune thrombocytopenic purpura: Secondary | ICD-10-CM | POA: Diagnosis not present

## 2019-04-17 DIAGNOSIS — F419 Anxiety disorder, unspecified: Secondary | ICD-10-CM | POA: Diagnosis not present

## 2019-04-17 DIAGNOSIS — Z79899 Other long term (current) drug therapy: Secondary | ICD-10-CM | POA: Diagnosis not present

## 2019-04-17 DIAGNOSIS — I10 Essential (primary) hypertension: Secondary | ICD-10-CM | POA: Diagnosis not present

## 2019-04-17 DIAGNOSIS — E7800 Pure hypercholesterolemia, unspecified: Secondary | ICD-10-CM | POA: Diagnosis not present

## 2019-04-21 ENCOUNTER — Other Ambulatory Visit: Payer: Self-pay

## 2019-04-21 ENCOUNTER — Inpatient Hospital Stay: Payer: PPO | Attending: Internal Medicine

## 2019-04-21 DIAGNOSIS — D589 Hereditary hemolytic anemia, unspecified: Secondary | ICD-10-CM | POA: Insufficient documentation

## 2019-04-21 DIAGNOSIS — D693 Immune thrombocytopenic purpura: Secondary | ICD-10-CM | POA: Insufficient documentation

## 2019-04-21 LAB — COMPREHENSIVE METABOLIC PANEL
ALT: 16 U/L (ref 0–44)
AST: 20 U/L (ref 15–41)
Albumin: 3.7 g/dL (ref 3.5–5.0)
Alkaline Phosphatase: 59 U/L (ref 38–126)
Anion gap: 9 (ref 5–15)
BUN: 17 mg/dL (ref 8–23)
CO2: 24 mmol/L (ref 22–32)
Calcium: 8.6 mg/dL — ABNORMAL LOW (ref 8.9–10.3)
Chloride: 101 mmol/L (ref 98–111)
Creatinine, Ser: 0.84 mg/dL (ref 0.61–1.24)
GFR calc Af Amer: >60 mL/min (ref >60)
GFR calc non Af Amer: >60 mL/min (ref >60)
Glucose, Bld: 106 mg/dL — ABNORMAL HIGH (ref 70–99)
Potassium: 4.1 mmol/L (ref 3.5–5.1)
Sodium: 134 mmol/L — ABNORMAL LOW (ref 135–145)
Total Bilirubin: 0.6 mg/dL (ref 0.3–1.2)
Total Protein: 6.7 g/dL (ref 6.5–8.1)

## 2019-04-21 LAB — CBC WITH DIFFERENTIAL/PLATELET
Abs Immature Granulocytes: 0.02 10*3/uL (ref 0.00–0.07)
Basophils Absolute: 0 10*3/uL (ref 0.0–0.1)
Basophils Relative: 0 %
Eosinophils Absolute: 0.3 10*3/uL (ref 0.0–0.5)
Eosinophils Relative: 4 %
HCT: 38.3 % — ABNORMAL LOW (ref 39.0–52.0)
Hemoglobin: 13 g/dL (ref 13.0–17.0)
Immature Granulocytes: 0 %
Lymphocytes Relative: 21 %
Lymphs Abs: 1.7 10*3/uL (ref 0.7–4.0)
MCH: 33.4 pg (ref 26.0–34.0)
MCHC: 33.9 g/dL (ref 30.0–36.0)
MCV: 98.5 fL (ref 80.0–100.0)
Monocytes Absolute: 0.7 10*3/uL (ref 0.1–1.0)
Monocytes Relative: 9 %
Neutro Abs: 5.4 10*3/uL (ref 1.7–7.7)
Neutrophils Relative %: 66 %
Platelets: 322 10*3/uL (ref 150–400)
RBC: 3.89 MIL/uL — ABNORMAL LOW (ref 4.22–5.81)
RDW: 13.8 % (ref 11.5–15.5)
WBC: 8.2 10*3/uL (ref 4.0–10.5)
nRBC: 0 % (ref 0.0–0.2)

## 2019-04-24 MED FILL — PROMACTA 25 MG TABLET: 25 | 30 days supply | Qty: 15 | Fill #2

## 2019-04-28 ENCOUNTER — Inpatient Hospital Stay: Payer: PPO

## 2019-04-28 ENCOUNTER — Other Ambulatory Visit: Payer: Self-pay

## 2019-04-28 ENCOUNTER — Telehealth: Payer: Self-pay | Admitting: *Deleted

## 2019-04-28 DIAGNOSIS — D693 Immune thrombocytopenic purpura: Secondary | ICD-10-CM | POA: Diagnosis not present

## 2019-04-28 LAB — CBC WITH DIFFERENTIAL/PLATELET
Abs Immature Granulocytes: 0.04 10*3/uL (ref 0.00–0.07)
Basophils Absolute: 0 10*3/uL (ref 0.0–0.1)
Basophils Relative: 0 %
Eosinophils Absolute: 0.3 10*3/uL (ref 0.0–0.5)
Eosinophils Relative: 3 %
HCT: 38.8 % — ABNORMAL LOW (ref 39.0–52.0)
Hemoglobin: 13 g/dL (ref 13.0–17.0)
Immature Granulocytes: 1 %
Lymphocytes Relative: 20 %
Lymphs Abs: 1.7 10*3/uL (ref 0.7–4.0)
MCH: 33.1 pg (ref 26.0–34.0)
MCHC: 33.5 g/dL (ref 30.0–36.0)
MCV: 98.7 fL (ref 80.0–100.0)
Monocytes Absolute: 0.7 10*3/uL (ref 0.1–1.0)
Monocytes Relative: 8 %
Neutro Abs: 5.8 10*3/uL (ref 1.7–7.7)
Neutrophils Relative %: 68 %
Platelets: 311 10*3/uL (ref 150–400)
RBC: 3.93 MIL/uL — ABNORMAL LOW (ref 4.22–5.81)
RDW: 13.7 % (ref 11.5–15.5)
WBC: 8.6 10*3/uL (ref 4.0–10.5)
nRBC: 0 % (ref 0.0–0.2)

## 2019-04-28 LAB — COMPREHENSIVE METABOLIC PANEL
ALT: 20 U/L (ref 0–44)
AST: 21 U/L (ref 15–41)
Albumin: 3.8 g/dL (ref 3.5–5.0)
Alkaline Phosphatase: 56 U/L (ref 38–126)
Anion gap: 6 (ref 5–15)
BUN: 15 mg/dL (ref 8–23)
CO2: 27 mmol/L (ref 22–32)
Calcium: 8.7 mg/dL — ABNORMAL LOW (ref 8.9–10.3)
Chloride: 104 mmol/L (ref 98–111)
Creatinine, Ser: 0.96 mg/dL (ref 0.61–1.24)
GFR calc Af Amer: >60 mL/min (ref >60)
GFR calc non Af Amer: >60 mL/min (ref >60)
Glucose, Bld: 107 mg/dL — ABNORMAL HIGH (ref 70–99)
Potassium: 4.1 mmol/L (ref 3.5–5.1)
Sodium: 137 mmol/L (ref 135–145)
Total Bilirubin: 0.5 mg/dL (ref 0.3–1.2)
Total Protein: 6.6 g/dL (ref 6.5–8.1)

## 2019-04-28 NOTE — Telephone Encounter (Signed)
Patient's wife called. Left msg for Nira Conn to return. Call returned. Reviewed plt count results with wife and future follow-up apts. Per Dr. Jacinto Reap- wife instructed to have patient keep the apts as scheduled in September. She thanked me for returning her phone call.

## 2019-05-08 ENCOUNTER — Other Ambulatory Visit: Payer: Self-pay | Admitting: Oncology

## 2019-05-08 DIAGNOSIS — D693 Immune thrombocytopenic purpura: Secondary | ICD-10-CM

## 2019-05-08 NOTE — Telephone Encounter (Signed)
Dr. B patient  

## 2019-05-12 DIAGNOSIS — Z8546 Personal history of malignant neoplasm of prostate: Secondary | ICD-10-CM | POA: Diagnosis not present

## 2019-05-12 DIAGNOSIS — C61 Malignant neoplasm of prostate: Secondary | ICD-10-CM | POA: Diagnosis not present

## 2019-05-12 DIAGNOSIS — Z08 Encounter for follow-up examination after completed treatment for malignant neoplasm: Secondary | ICD-10-CM | POA: Diagnosis not present

## 2019-06-02 MED FILL — PROMACTA 25 MG TABLET: 25 | 30 days supply | Qty: 15 | Fill #3

## 2019-06-24 DIAGNOSIS — H40051 Ocular hypertension, right eye: Secondary | ICD-10-CM | POA: Diagnosis not present

## 2019-06-25 DIAGNOSIS — E7800 Pure hypercholesterolemia, unspecified: Secondary | ICD-10-CM | POA: Diagnosis not present

## 2019-06-25 DIAGNOSIS — D693 Immune thrombocytopenic purpura: Secondary | ICD-10-CM | POA: Diagnosis not present

## 2019-06-25 DIAGNOSIS — R55 Syncope and collapse: Secondary | ICD-10-CM | POA: Diagnosis not present

## 2019-06-25 DIAGNOSIS — I10 Essential (primary) hypertension: Secondary | ICD-10-CM | POA: Diagnosis not present

## 2019-07-07 ENCOUNTER — Telehealth: Payer: Self-pay | Admitting: Pharmacy Technician

## 2019-07-07 MED FILL — PROMACTA 25 MG TABLET: 25 | 30 days supply | Qty: 15 | Fill #4

## 2019-07-07 NOTE — Telephone Encounter (Signed)
Oral Oncology Patient Advocate Encounter  Received notification from [employer] that prior authorization for Promacta is required.  PA submitted on CoverMyMeds Key [id number] Status is pending  Oral Oncology Clinic will continue to follow.   [place] Patient [place] Phone [phone] Fax [phone] 07/07/2019 9:27 AM

## 2019-07-07 NOTE — Telephone Encounter (Signed)
Oral Oncology Patient Advocate Encounter  Prior Authorization for [name] has been approved.    PA# [id number]  Effective dates: 07/07/2019 through 07/06/2020  Oral Oncology Clinic will continue to follow.   [place] Patient [place] Phone [phone] Fax [phone] 07/07/2019 11:12 AM

## 2019-07-29 ENCOUNTER — Other Ambulatory Visit: Payer: Self-pay | Admitting: Internal Medicine

## 2019-07-29 DIAGNOSIS — D693 Immune thrombocytopenic purpura: Secondary | ICD-10-CM

## 2019-08-01 ENCOUNTER — Encounter: Payer: Self-pay | Admitting: Internal Medicine

## 2019-08-01 ENCOUNTER — Other Ambulatory Visit: Payer: Self-pay

## 2019-08-01 NOTE — Progress Notes (Signed)
Patient was unavailable for screening wife was abel to complete screening.

## 2019-08-04 ENCOUNTER — Inpatient Hospital Stay (HOSPITAL_BASED_OUTPATIENT_CLINIC_OR_DEPARTMENT_OTHER): Payer: PPO | Admitting: Internal Medicine

## 2019-08-04 ENCOUNTER — Other Ambulatory Visit: Payer: Self-pay

## 2019-08-04 ENCOUNTER — Inpatient Hospital Stay: Payer: PPO | Attending: Internal Medicine

## 2019-08-04 DIAGNOSIS — Z8546 Personal history of malignant neoplasm of prostate: Secondary | ICD-10-CM | POA: Diagnosis not present

## 2019-08-04 DIAGNOSIS — D693 Immune thrombocytopenic purpura: Secondary | ICD-10-CM | POA: Diagnosis not present

## 2019-08-04 DIAGNOSIS — Z79899 Other long term (current) drug therapy: Secondary | ICD-10-CM | POA: Insufficient documentation

## 2019-08-04 DIAGNOSIS — I10 Essential (primary) hypertension: Secondary | ICD-10-CM | POA: Diagnosis not present

## 2019-08-04 DIAGNOSIS — E7800 Pure hypercholesterolemia, unspecified: Secondary | ICD-10-CM | POA: Diagnosis not present

## 2019-08-04 DIAGNOSIS — D591 Other autoimmune hemolytic anemias: Secondary | ICD-10-CM | POA: Insufficient documentation

## 2019-08-04 LAB — COMPREHENSIVE METABOLIC PANEL
ALT: 16 U/L (ref 0–44)
AST: 20 U/L (ref 15–41)
Albumin: 3.7 g/dL (ref 3.5–5.0)
Alkaline Phosphatase: 54 U/L (ref 38–126)
Anion gap: 7 (ref 5–15)
BUN: 17 mg/dL (ref 8–23)
CO2: 25 mmol/L (ref 22–32)
Calcium: 8.7 mg/dL — ABNORMAL LOW (ref 8.9–10.3)
Chloride: 104 mmol/L (ref 98–111)
Creatinine, Ser: 0.99 mg/dL (ref 0.61–1.24)
GFR calc Af Amer: >60 mL/min (ref >60)
GFR calc non Af Amer: >60 mL/min (ref >60)
Glucose, Bld: 106 mg/dL — ABNORMAL HIGH (ref 70–99)
Potassium: 4 mmol/L (ref 3.5–5.1)
Sodium: 136 mmol/L (ref 135–145)
Total Bilirubin: 0.7 mg/dL (ref 0.3–1.2)
Total Protein: 6.5 g/dL (ref 6.5–8.1)

## 2019-08-04 LAB — CBC WITH DIFFERENTIAL/PLATELET
Abs Immature Granulocytes: 0.02 10*3/uL (ref 0.00–0.07)
Basophils Absolute: 0 10*3/uL (ref 0.0–0.1)
Basophils Relative: 0 %
Eosinophils Absolute: 0.2 10*3/uL (ref 0.0–0.5)
Eosinophils Relative: 2 %
HCT: 39.1 % (ref 39.0–52.0)
Hemoglobin: 13 g/dL (ref 13.0–17.0)
Immature Granulocytes: 0 %
Lymphocytes Relative: 21 %
Lymphs Abs: 1.6 10*3/uL (ref 0.7–4.0)
MCH: 33.1 pg (ref 26.0–34.0)
MCHC: 33.2 g/dL (ref 30.0–36.0)
MCV: 99.5 fL (ref 80.0–100.0)
Monocytes Absolute: 0.7 10*3/uL (ref 0.1–1.0)
Monocytes Relative: 9 %
Neutro Abs: 5.4 10*3/uL (ref 1.7–7.7)
Neutrophils Relative %: 68 %
Platelets: 260 10*3/uL (ref 150–400)
RBC: 3.93 MIL/uL — ABNORMAL LOW (ref 4.22–5.81)
RDW: 13.3 % (ref 11.5–15.5)
WBC: 8 10*3/uL (ref 4.0–10.5)
nRBC: 0 % (ref 0.0–0.2)

## 2019-08-04 NOTE — Assessment & Plan Note (Signed)
#  ITP relapsed; currently on Promacta 25 mg every other day. Platelets 260.   #Hemolytic anemia hemoglobin 13/LDH normal.  Continue CellCept 1000 milligrams in the morning 500 mg at nigh- STABLE.   # DISPOSITION:labs print out # labs q 4 weeks- cbc;  # follow up in 4 months/labs-cbc/cmp- Dr.B

## 2019-08-04 NOTE — Progress Notes (Signed)
[facility] OFFICE PROGRESS NOTE  Patient Care Team: Derinda Late, MD as PCP - General (Family Medicine)   SUMMARY OF ONCOLOGIC HISTORY:  #FEB 2016-  ITP  s/p Prednisone; OCT 2016- Relapse of ITP- Prednisone; JAN 13th- START RITUXAN q W x4 [finished Feb 8th]; March 10th 2017- N-plate q W- good response; Dec 2018- promacta 25 mg qOD.   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   # Hx of nose bleeds  INTERVAL HISTORY:  A very pleasant 85 year old male patient with a history of autoimmune hemolytic anemia/ITP is here for follow-up.  Denies any gum bleeding.  Denies any nosebleeds.  Appetite is good but no weight loss.  Continues to be on CellCept and Promacta.  Review of Systems  Constitutional: Negative for chills, diaphoresis, fever, malaise/fatigue and weight loss.  HENT: Negative for nosebleeds and sore throat.   Eyes: Negative for double vision.  Respiratory: Negative for cough, hemoptysis, sputum production, shortness of breath and wheezing.   Cardiovascular: Negative for chest pain, palpitations, orthopnea and leg swelling.  Gastrointestinal: Negative for abdominal pain, blood in stool, constipation, diarrhea, heartburn, melena, nausea and vomiting.  Genitourinary: Negative for dysuria, frequency and urgency.  Musculoskeletal: Negative for back pain and joint pain.  Skin: Negative.  Negative for itching and rash.  Neurological: Negative for dizziness, tingling, focal weakness, weakness and headaches.  Endo/Heme/Allergies: Does not bruise/bleed easily.  Psychiatric/Behavioral: Negative for depression. The patient is not nervous/anxious and does not have insomnia.     PAST MEDICAL HISTORY :  Past Medical History:  Diagnosis Date  . Autoimmune hemolytic anemia (HCC)   . Dehydration   . Detached retina   . Hypercholesteremia   . Hypertension   . Insomnia   . ITP (idiopathic thrombocytopenic  purpura) 09/01/2015  . Leukocytosis   . Prostate cancer ([place])   . Shingles     PAST SURGICAL HISTORY :   Past Surgical History:  Procedure Laterality Date  . artificial left eye    . EYE SURGERY    . HERNIA REPAIR    . JOINT REPLACEMENT    . PROSTATE SURGERY      FAMILY HISTORY :   Family History  Problem Relation Age of Onset  . Cancer Mother   . Multiple sclerosis Father     SOCIAL HISTORY:   Social History   Tobacco Use  . Smoking status: Never Smoker  Substance Use Topics  . Alcohol use: No  . Drug use: No    ALLERGIES:  is allergic to cephalexin and hydrocodone-acetaminophen.  MEDICATIONS:  Current Outpatient Medications  Medication Sig Dispense Refill  . acetaminophen (TYLENOL) 500 MG tablet Take 500 mg by mouth every 6 (six) hours as needed for mild pain. Reported on 01/24/2016    . citalopram (CELEXA) 10 MG tablet Take 10 mg by mouth daily.    . Cyanocobalamin (RA VITAMIN B-12 TR) 1000 MCG TBCR Take 1,000 mcg by mouth daily.    . feeding supplement, ENSURE ENLIVE, (ENSURE ENLIVE) LIQD Take 237 mLs by mouth 2 (two) times daily between meals. 237 mL 12  . gabapentin (NEURONTIN) 100 MG capsule Take 100 mg by mouth at bedtime. Reported on 12/03/2015    . lisinopril (PRINIVIL,ZESTRIL) 5 MG tablet Take 10 mg by mouth daily.     . meclizine (ANTIVERT) 25 MG tablet Take 1 tablet by mouth as needed for dizziness.    . mycophenolate (CELLCEPT) 500 MG tablet  TAKE TWO TABLETS EVERY MORNING AND TAKE ONE TABLET EVERY EVENING 90 tablet 3  . omeprazole (PRILOSEC) 40 MG capsule TAKE 1 CAPSULE BY MOUTH DAILY USUALLY 30 MINUTES BEFORE BREAKFAST 90 capsule 2  . polyethylene glycol (MIRALAX / GLYCOLAX) packet Take 17 g by mouth daily as needed for mild constipation. 14 each 0  . polyvinyl alcohol (LIQUIFILM TEARS) 1.4 % ophthalmic solution Place 2 drops into the left eye 3 (three) times daily as needed for dry eyes. 15 mL 0  . pravastatin (PRAVACHOL) 20 MG tablet Take 20 mg by mouth  at bedtime.     Marland Kitchen PROMACTA 25 MG tablet TAKE 1 TABLET (25 MG TOTAL) BY MOUTH EVERY OTHER DAY. TAKE ON AN EMPTY STOMACH, 1 HOUR BEFORE A MEAL OR 2 HOURS AFTER. 15 tablet 4   No current facility-administered medications for this visit.     PHYSICAL EXAMINATION:   BP (!) 148/74 (BP Location: Left Arm, Patient Position: Sitting)   Pulse 61   Temp (!) 97.1 F (36.2 C) (Tympanic)   Resp 20   Wt 171 lb 9.6 oz (77.8 kg)   BMI 27.45 kg/m   Filed Weights   08/04/19 1416  Weight: 171 lb 9.6 oz (77.8 kg)    Physical Exam  Constitutional: He is oriented to person, place, and time and well-developed, well-nourished, and in no distress.  HENT:  Head: Normocephalic and atraumatic.  Mouth/Throat: Oropharynx is clear and moist. No oropharyngeal exudate.  Eyes: Pupils are equal, round, and reactive to light.  Neck: Normal range of motion. Neck supple.  Cardiovascular: Normal rate and regular rhythm.  Pulmonary/Chest: No respiratory distress. He has no wheezes.  Abdominal: Soft. Bowel sounds are normal. He exhibits no distension and no mass. There is no abdominal tenderness. There is no rebound and no guarding.  Musculoskeletal: Normal range of motion.        General: No tenderness or edema.  Neurological: He is alert and oriented to person, place, and time.  Skin: Skin is warm.  Psychiatric: Affect normal.     LABORATORY DATA:  I have reviewed the data as listed    Component Value Date/Time   NA 136 08/04/2019 1406   NA 131 (L) 03/17/2015 1425   K 4.0 08/04/2019 1406   K 3.9 03/17/2015 1425   CL 104 08/04/2019 1406   CL 101 03/17/2015 1425   CO2 25 08/04/2019 1406   CO2 26 03/17/2015 1425   GLUCOSE 106 (H) 08/04/2019 1406   GLUCOSE 161 (H) 03/17/2015 1425   BUN 17 08/04/2019 1406   BUN 17 03/17/2015 1425   CREATININE 0.99 08/04/2019 1406   CREATININE 0.90 03/17/2015 1425   CALCIUM 8.7 (L) 08/04/2019 1406   CALCIUM 8.5 (L) 03/17/2015 1425   PROT 6.5 08/04/2019 1406   PROT  6.7 03/17/2015 1425   ALBUMIN 3.7 08/04/2019 1406   ALBUMIN 3.7 03/17/2015 1425   AST 20 08/04/2019 1406   AST 25 03/17/2015 1425   ALT 16 08/04/2019 1406   ALT 23 03/17/2015 1425   ALKPHOS 54 08/04/2019 1406   ALKPHOS 44 03/17/2015 1425   BILITOT 0.7 08/04/2019 1406   BILITOT 0.6 03/17/2015 1425   GFRNONAA >60 08/04/2019 1406   GFRNONAA >60 03/17/2015 1425   GFRAA >60 08/04/2019 1406   GFRAA >60 03/17/2015 1425    No results found for: SPEP, UPEP  Lab Results  Component Value Date   WBC 8.0 08/04/2019   NEUTROABS 5.4 08/04/2019   HGB 13.0 08/04/2019  HCT 39.1 08/04/2019   MCV 99.5 08/04/2019   PLT 260 08/04/2019      Chemistry      Component Value Date/Time   NA 136 08/04/2019 1406   NA 131 (L) 03/17/2015 1425   K 4.0 08/04/2019 1406   K 3.9 03/17/2015 1425   CL 104 08/04/2019 1406   CL 101 03/17/2015 1425   CO2 25 08/04/2019 1406   CO2 26 03/17/2015 1425   BUN 17 08/04/2019 1406   BUN 17 03/17/2015 1425   CREATININE 0.99 08/04/2019 1406   CREATININE 0.90 03/17/2015 1425      Component Value Date/Time   CALCIUM 8.7 (L) 08/04/2019 1406   CALCIUM 8.5 (L) 03/17/2015 1425   ALKPHOS 54 08/04/2019 1406   ALKPHOS 44 03/17/2015 1425   AST 20 08/04/2019 1406   AST 25 03/17/2015 1425   ALT 16 08/04/2019 1406   ALT 23 03/17/2015 1425   BILITOT 0.7 08/04/2019 1406   BILITOT 0.6 03/17/2015 1425       ASSESSMENT & PLAN:   Idiopathic thrombocytopenic purpura ([place]) #ITP relapsed; currently on Promacta 25 mg every other day. Platelets 260.   #Hemolytic anemia hemoglobin 13/LDH normal.  Continue CellCept 1000 milligrams in the morning 500 mg at nigh- STABLE.   # DISPOSITION:labs print out # labs q 4 weeks- cbc;  # follow up in 4 months/labs-cbc/cmp- Dr.B      [name], MD 08/04/2019 4:49 PM

## 2019-08-06 ENCOUNTER — Other Ambulatory Visit: Payer: Self-pay | Admitting: Internal Medicine

## 2019-08-06 DIAGNOSIS — D693 Immune thrombocytopenic purpura: Secondary | ICD-10-CM

## 2019-08-07 MED FILL — PROMACTA 25 MG TABLET: 25 | 30 days supply | Qty: 15 | Fill #0

## 2019-09-01 ENCOUNTER — Inpatient Hospital Stay: Payer: PPO

## 2019-09-08 ENCOUNTER — Inpatient Hospital Stay: Payer: PPO | Attending: Internal Medicine

## 2019-09-08 ENCOUNTER — Other Ambulatory Visit: Payer: Self-pay

## 2019-09-08 DIAGNOSIS — D693 Immune thrombocytopenic purpura: Secondary | ICD-10-CM

## 2019-09-08 DIAGNOSIS — E7800 Pure hypercholesterolemia, unspecified: Secondary | ICD-10-CM | POA: Diagnosis not present

## 2019-09-08 DIAGNOSIS — Z8546 Personal history of malignant neoplasm of prostate: Secondary | ICD-10-CM | POA: Insufficient documentation

## 2019-09-08 DIAGNOSIS — I10 Essential (primary) hypertension: Secondary | ICD-10-CM | POA: Insufficient documentation

## 2019-09-08 DIAGNOSIS — Z79899 Other long term (current) drug therapy: Secondary | ICD-10-CM | POA: Diagnosis not present

## 2019-09-08 DIAGNOSIS — D591 Autoimmune hemolytic anemia, unspecified: Secondary | ICD-10-CM | POA: Insufficient documentation

## 2019-09-08 LAB — CBC WITH DIFFERENTIAL/PLATELET
Abs Immature Granulocytes: 0.02 10*3/uL (ref 0.00–0.07)
Basophils Absolute: 0 10*3/uL (ref 0.0–0.1)
Basophils Relative: 0 %
Eosinophils Absolute: 0.1 10*3/uL (ref 0.0–0.5)
Eosinophils Relative: 2 %
HCT: 39.9 % (ref 39.0–52.0)
Hemoglobin: 13.4 g/dL (ref 13.0–17.0)
Immature Granulocytes: 0 %
Lymphocytes Relative: 18 %
Lymphs Abs: 1.7 10*3/uL (ref 0.7–4.0)
MCH: 33.3 pg (ref 26.0–34.0)
MCHC: 33.6 g/dL (ref 30.0–36.0)
MCV: 99.3 fL (ref 80.0–100.0)
Monocytes Absolute: 0.7 10*3/uL (ref 0.1–1.0)
Monocytes Relative: 8 %
Neutro Abs: 6.7 10*3/uL (ref 1.7–7.7)
Neutrophils Relative %: 72 %
Platelets: 313 10*3/uL (ref 150–400)
RBC: 4.02 MIL/uL — ABNORMAL LOW (ref 4.22–5.81)
RDW: 12.9 % (ref 11.5–15.5)
WBC: 9.3 10*3/uL (ref 4.0–10.5)
nRBC: 0 % (ref 0.0–0.2)

## 2019-09-08 LAB — COMPREHENSIVE METABOLIC PANEL
ALT: 18 U/L (ref 0–44)
AST: 21 U/L (ref 15–41)
Albumin: 3.8 g/dL (ref 3.5–5.0)
Alkaline Phosphatase: 55 U/L (ref 38–126)
Anion gap: 7 (ref 5–15)
BUN: 20 mg/dL (ref 8–23)
CO2: 25 mmol/L (ref 22–32)
Calcium: 8.8 mg/dL — ABNORMAL LOW (ref 8.9–10.3)
Chloride: 102 mmol/L (ref 98–111)
Creatinine, Ser: 0.86 mg/dL (ref 0.61–1.24)
GFR calc Af Amer: >60 mL/min (ref >60)
GFR calc non Af Amer: >60 mL/min (ref >60)
Glucose, Bld: 130 mg/dL — ABNORMAL HIGH (ref 70–99)
Potassium: 4.2 mmol/L (ref 3.5–5.1)
Sodium: 134 mmol/L — ABNORMAL LOW (ref 135–145)
Total Bilirubin: 0.7 mg/dL (ref 0.3–1.2)
Total Protein: 6.9 g/dL (ref 6.5–8.1)

## 2019-09-09 MED FILL — PROMACTA 25 MG TABLET: 25 | 30 days supply | Qty: 15 | Fill #1

## 2019-09-26 ENCOUNTER — Other Ambulatory Visit: Payer: Self-pay

## 2019-09-29 ENCOUNTER — Inpatient Hospital Stay: Payer: PPO | Attending: Internal Medicine

## 2019-09-29 ENCOUNTER — Other Ambulatory Visit: Payer: Self-pay

## 2019-09-29 DIAGNOSIS — Z8546 Personal history of malignant neoplasm of prostate: Secondary | ICD-10-CM | POA: Insufficient documentation

## 2019-09-29 DIAGNOSIS — I10 Essential (primary) hypertension: Secondary | ICD-10-CM | POA: Insufficient documentation

## 2019-09-29 DIAGNOSIS — Z79899 Other long term (current) drug therapy: Secondary | ICD-10-CM | POA: Diagnosis not present

## 2019-09-29 DIAGNOSIS — D591 Autoimmune hemolytic anemia, unspecified: Secondary | ICD-10-CM | POA: Insufficient documentation

## 2019-09-29 DIAGNOSIS — D693 Immune thrombocytopenic purpura: Secondary | ICD-10-CM | POA: Insufficient documentation

## 2019-09-29 DIAGNOSIS — E7800 Pure hypercholesterolemia, unspecified: Secondary | ICD-10-CM | POA: Insufficient documentation

## 2019-09-29 LAB — CBC WITH DIFFERENTIAL/PLATELET
Abs Immature Granulocytes: 0.03 10*3/uL (ref 0.00–0.07)
Basophils Absolute: 0 10*3/uL (ref 0.0–0.1)
Basophils Relative: 0 %
Eosinophils Absolute: 0.1 10*3/uL (ref 0.0–0.5)
Eosinophils Relative: 2 %
HCT: 39.4 % (ref 39.0–52.0)
Hemoglobin: 13.4 g/dL (ref 13.0–17.0)
Immature Granulocytes: 0 %
Lymphocytes Relative: 17 %
Lymphs Abs: 1.4 10*3/uL (ref 0.7–4.0)
MCH: 33.8 pg (ref 26.0–34.0)
MCHC: 34 g/dL (ref 30.0–36.0)
MCV: 99.5 fL (ref 80.0–100.0)
Monocytes Absolute: 0.6 10*3/uL (ref 0.1–1.0)
Monocytes Relative: 8 %
Neutro Abs: 6.2 10*3/uL (ref 1.7–7.7)
Neutrophils Relative %: 73 %
Platelets: 285 10*3/uL (ref 150–400)
RBC: 3.96 MIL/uL — ABNORMAL LOW (ref 4.22–5.81)
RDW: 13 % (ref 11.5–15.5)
WBC: 8.5 10*3/uL (ref 4.0–10.5)
nRBC: 0 % (ref 0.0–0.2)

## 2019-10-01 ENCOUNTER — Telehealth: Payer: Self-pay | Admitting: Pharmacist

## 2019-10-01 NOTE — Telephone Encounter (Signed)
Oral Chemotherapy Pharmacist Encounter   Mrs. Brougham gave me a call yesterday stating that she received a flyer in the mail for re-enrollment in 2021 copay assistance through [employer]. She stopped by with the assistance re-enrollment information today and I helped her complete the application electronically.  Re-enrollment ID: [id number]  Mrs. Feldhaus plans on calling [employer] to following on the receipt of the application later this week. She knows to call me with any issue or when she finds out Mr. Counselman is approved.  Darl Pikes, PharmD, BCPS, [facility] Hematology/Oncology Clinical Pharmacist [facility]/AP [facility] [phone]  10/01/2019 3:28 PM

## 2019-10-03 ENCOUNTER — Telehealth: Payer: Self-pay | Admitting: Internal Medicine

## 2019-10-03 NOTE — Telephone Encounter (Signed)
Late entry-I spoke at length with patient's wife [name] regarding her concerns family gatherings during holidays in the context of Covid infection.  I would recommend giving gatherings [based on the age]/risk of infection in the community; using best judgment.  Also recommend taking to PCP.  Family thankful for the call.

## 2019-10-06 MED FILL — PROMACTA 25 MG TABLET: 25 | 30 days supply | Qty: 15 | Fill #2

## 2019-10-20 DIAGNOSIS — E7800 Pure hypercholesterolemia, unspecified: Secondary | ICD-10-CM | POA: Diagnosis not present

## 2019-10-20 DIAGNOSIS — Z79899 Other long term (current) drug therapy: Secondary | ICD-10-CM | POA: Diagnosis not present

## 2019-10-23 ENCOUNTER — Other Ambulatory Visit: Payer: Self-pay

## 2019-10-23 ENCOUNTER — Encounter: Payer: PPO | Attending: Physician Assistant | Admitting: Physician Assistant

## 2019-10-23 DIAGNOSIS — L98499 Non-pressure chronic ulcer of skin of other sites with unspecified severity: Secondary | ICD-10-CM | POA: Insufficient documentation

## 2019-10-23 DIAGNOSIS — S51811A Laceration without foreign body of right forearm, initial encounter: Secondary | ICD-10-CM | POA: Insufficient documentation

## 2019-10-23 DIAGNOSIS — Z825 Family history of asthma and other chronic lower respiratory diseases: Secondary | ICD-10-CM | POA: Insufficient documentation

## 2019-10-23 DIAGNOSIS — Z841 Family history of disorders of kidney and ureter: Secondary | ICD-10-CM | POA: Insufficient documentation

## 2019-10-23 DIAGNOSIS — I10 Essential (primary) hypertension: Secondary | ICD-10-CM | POA: Diagnosis not present

## 2019-10-23 DIAGNOSIS — Z881 Allergy status to other antibiotic agents status: Secondary | ICD-10-CM | POA: Insufficient documentation

## 2019-10-23 DIAGNOSIS — Z8546 Personal history of malignant neoplasm of prostate: Secondary | ICD-10-CM | POA: Diagnosis not present

## 2019-10-23 DIAGNOSIS — Z885 Allergy status to narcotic agent status: Secondary | ICD-10-CM | POA: Insufficient documentation

## 2019-10-23 DIAGNOSIS — Z9079 Acquired absence of other genital organ(s): Secondary | ICD-10-CM | POA: Insufficient documentation

## 2019-10-23 DIAGNOSIS — Z8349 Family history of other endocrine, nutritional and metabolic diseases: Secondary | ICD-10-CM | POA: Diagnosis not present

## 2019-10-23 DIAGNOSIS — Z809 Family history of malignant neoplasm, unspecified: Secondary | ICD-10-CM | POA: Insufficient documentation

## 2019-10-23 NOTE — Progress Notes (Signed)
JARVIS, YOCHUM ([id number]) Visit Report for 10/23/2019 Abuse/Suicide Risk Screen Details Patient Name: JALANI, CHILLIS. Date of Service: 10/23/2019 3:30 PM Medical Record Number: [id number] Patient Account Number: [id number] Date of Birth/Sex: May 30, 1932 (85 y.o. M) Treating RN: Montey Hora Primary Care Oziah Vitanza: Derinda Late Other Clinician: Referring Darcie Mellone: Referral, Self Treating Cliffard Hair/Extender: STONE III, HOYT Weeks in Treatment: 0 Abuse/Suicide Risk Screen Items Answer ABUSE RISK SCREEN: Has anyone close to you tried to hurt or harm you recentlyo No Do you feel uncomfortable with anyone in your familyo No Has anyone forced you do things that you didnot want to doo No Electronic Signature(s) Signed: 10/23/2019 5:00:08 PM By: Montey Hora Entered By: Montey Hora on 10/23/2019 [place]. ([id number]) -------------------------------------------------------------------------------- Activities of Daily Living Details Patient Name: SELIG, STEUER C. Date of Service: 10/23/2019 3:30 PM Medical Record Number: [id number] Patient Account Number: [id number] Date of Birth/Sex: 06-23-1932 (86 y.o. M) Treating RN: Montey Hora Primary Care Daylen Hack: Derinda Late Other Clinician: Referring Yomira Flitton: Referral, Self Treating Jessen Siegman/Extender: STONE III, HOYT Weeks in Treatment: 0 Activities of Daily Living Items Answer Activities of Daily Living (Please select one for each item) Drive Automobile Completely Able Take Medications Completely Able Use Telephone Completely Able Care for Appearance Completely Able Use Toilet Completely Able Bath / Shower Completely Able Dress Self Completely Able Feed Self Completely Able Walk Completely Able Get In / Out Bed Completely Able Housework Completely Able Prepare Meals Completely [place] Completely Able Shop for Self Completely Able Electronic Signature(s) Signed: 10/23/2019 5:00:08 PM By:  Montey Hora Entered By: Montey Hora on 10/23/2019 16:14:30 [name] ([id number]) -------------------------------------------------------------------------------- Education Screening Details Patient Name: [name]. Date of Service: 10/23/2019 3:30 PM Medical Record Number: [id number] Patient Account Number: [id number] Date of Birth/Sex: [birth year] (85 y.o. M) Treating RN: Montey Hora Primary Care Trinidee Schrag: Derinda Late Other Clinician: Referring Aleksa Catterton: Referral, Self Treating Kathalene Sporer/Extender: Melburn Hake, HOYT Weeks in Treatment: 0 Primary Learner Assessed: Patient Learning Preferences/Education Level/Primary Language Learning Preference: Explanation, Demonstration Highest Education Level: College or Above Preferred Language: English Cognitive Barrier Language Barrier: No Translator Needed: No Memory Deficit: No Emotional Barrier: No Cultural/Religious Beliefs Affecting Medical Care: No Physical Barrier Impaired Vision: No Impaired Hearing: No Decreased Hand dexterity: No Knowledge/Comprehension Knowledge Level: Medium Comprehension Level: Medium Ability to understand written Medium instructions: Ability to understand verbal Medium instructions: Motivation Anxiety Level: Calm Cooperation: Cooperative Education Importance: Acknowledges Need Interest in Health Problems: Asks Questions Perception: Coherent Willingness to Engage in Self- Medium Management Activities: Readiness to Engage in Self- Medium Management Activities: Electronic Signature(s) Signed: 10/23/2019 5:00:08 PM By: Montey Hora Entered By: Montey Hora on 10/23/2019 16:14:54 [name] ([id number]) -------------------------------------------------------------------------------- Fall Risk Assessment Details Patient Name: [name]. Date of Service: 10/23/2019 3:30 PM Medical Record Number: [id number] Patient Account Number: [id number] Date of  Birth/Sex: 26-Aug-1932 (85 y.o. M) Treating RN: Montey Hora Primary Care Kaito Schulenburg: Derinda Late Other Clinician: Referring Cassadie Pankonin: Referral, Self Treating Quill Grinder/Extender: STONE III, HOYT Weeks in Treatment: 0 Fall Risk Assessment Items Have you had 2 or more falls in the last 12 monthso 0 No Have you had any fall that resulted in injury in the last 12 monthso 0 Yes FALLS RISK SCREEN History of falling - immediate or within 3 months 25 Yes Secondary diagnosis (Do you have 2 or more medical diagnoseso) 0 No Ambulatory aid None/bed rest/wheelchair/nurse 0 Yes Crutches/cane/walker 0 No Furniture 0 No Intravenous therapy Access/Saline/Heparin Lock 0 No Gait/Transferring Normal/ bed rest/ wheelchair 0 Yes  Weak (short steps with or without shuffle, stooped but able to lift head while 0 No walking, may seek support from furniture) Impaired (short steps with shuffle, may have difficulty arising from chair, head 0 No down, impaired balance) Mental Status Oriented to own ability 0 Yes Notes skin tear Electronic Signature(s) Signed: 10/23/2019 5:00:08 PM By: Montey Hora Entered By: Montey Hora on 10/23/2019 16:15:11 [name] ([id number]) -------------------------------------------------------------------------------- Foot Assessment Details Patient Name: Rosanne Gutting C. Date of Service: 10/23/2019 3:30 PM Medical Record Number: [id number] Patient Account Number: [id number] Date of Birth/Sex: [birth year] (85 y.o. M) Treating RN: Montey Hora Primary Care Aladdin Kollmann: Derinda Late Other Clinician: Referring Reather Steller: Referral, Self Treating Taliah Porche/Extender: STONE III, HOYT Weeks in Treatment: 0 Foot Assessment Items Site Locations + = Sensation present, - = Sensation absent, C = Callus, U = Ulcer R = Redness, W = Warmth, M = Maceration, PU = Pre-ulcerative lesion F = Fissure, S = Swelling, D = Dryness Assessment Right: Left: Other Deformity: No No Prior  Foot Ulcer: No No Prior Amputation: No No Charcot Joint: No No Ambulatory Status: Ambulatory Without Help Gait: Steady Electronic Signature(s) Signed: 10/23/2019 5:00:08 PM By: Montey Hora Entered By: Montey Hora on 10/23/2019 16:15:56 [name] ([id number]) -------------------------------------------------------------------------------- Nutrition Risk Screening Details Patient Name: Rosanne Gutting C. Date of Service: 10/23/2019 3:30 PM Medical Record Number: [id number] Patient Account Number: [id number] Date of Birth/Sex: 11/20/1932 (85 y.o. M) Treating RN: Montey Hora Primary Care Alvia Jablonski: Derinda Late Other Clinician: Referring Denissa Cozart: Referral, Self Treating Daymein Nunnery/Extender: STONE III, HOYT Weeks in Treatment: 0 Height (in): 68 Weight (lbs): 173 Body Mass Index (BMI): 26.3 Nutrition Risk Screening Items Score Screening NUTRITION RISK SCREEN: I have an illness or condition that made me change the kind and/or amount of 0 No food I eat I eat fewer than two meals per day 0 No I eat few fruits and vegetables, or milk products 0 No I have three or more drinks of beer, liquor or wine almost every day 0 No I have tooth or mouth problems that make it hard for me to eat 0 No I don't always have enough money to buy the food I need 0 No I eat alone most of the time 0 No I take three or more different prescribed or over-the-counter drugs a day 1 Yes Without wanting to, I have lost or gained 10 pounds in the last six months 0 No I am not always physically able to shop, cook and/or feed myself 0 No Nutrition Protocols Good Risk Protocol 0 No interventions needed Moderate Risk Protocol High Risk Proctocol Risk Level: Good Risk Score: 1 Electronic Signature(s) Signed: 10/23/2019 5:00:08 PM By: Montey Hora Entered By: Montey Hora on 10/23/2019 16:15:47

## 2019-10-24 NOTE — Progress Notes (Signed)
LADERIUS, GUIDE ([id number]) Visit Report for 10/23/2019 Chief Complaint Document Details Patient Name: [name], [name]. Date of Service: 10/23/2019 3:30 PM Medical Record Number: [id number] Patient Account Number: [id number] Date of Birth/Sex: Jul 17, 1932 (85 y.o. M) Treating RN: Army Melia Primary Care Provider: Derinda Late Other Clinician: Referring Provider: Referral, Self Treating Provider/Extender: STONE III, HOYT Weeks in Treatment: 0 Information Obtained from: Patient Chief Complaint Right forearm skin tear Electronic Signature(s) Signed: 10/23/2019 4:47:46 PM By: Worthy Keeler PA-C Entered By: Worthy Keeler on 10/23/2019 16:47:46 Lambing, Justin Mend ([id number]) -------------------------------------------------------------------------------- HPI Details Patient Name: [name] Date of Service: 10/23/2019 3:30 PM Medical Record Number: [id number] Patient Account Number: [id number] Date of Birth/Sex: Jan 20, 1932 (85 y.o. M) Treating RN: Army Melia Primary Care Provider: Derinda Late Other Clinician: Referring Provider: Referral, Self Treating Provider/Extender: Melburn Hake, HOYT Weeks in Treatment: 0 History of Present Illness HPI Description: 10/23/2019 on evaluation today patient appears to be doing poorly in regard to the right forearm skin tear which occurred yesterday when he was playing golf. He tells me that he was going down a hill and unfortunately slipped it was an area he was not supposed to be in. He was going to just play one whole after doing some practice shots. Fortunately he did not have any severe injuries but unfortunately he does have a skin tear that obviously is needing to be managed at this point. He does have hypertension which is fairly well controlled. He also has some discomfort but he tells me nothing to tremendous at this point. Fortunately there is no signs of active infection at this time. The skin unfortunately somewhat  peeled back they attempted to pull this back over but have not been able to get it to fully cover the wound. I think that we will be able to once we completely unfold the skin and I explained to him we would have to roll this back and completely release it from the bed of the wound in order to get it to cover the area as a whole. He is okay with that. Electronic Signature(s) Signed: 10/23/2019 5:09:19 PM By: Worthy Keeler PA-C Entered By: Worthy Keeler on 10/23/2019 17:09:18 IBAN, SOBEL ([id number]) -------------------------------------------------------------------------------- Physical Exam Details Patient Name: [name]. Date of Service: 10/23/2019 3:30 PM Medical Record Number: [id number] Patient Account Number: [id number] Date of Birth/Sex: 10-Feb-1932 (85 y.o. M) Treating RN: Army Melia Primary Care Provider: Derinda Late Other Clinician: Referring Provider: Referral, Self Treating Provider/Extender: STONE III, HOYT Weeks in Treatment: 0 Constitutional sitting or standing blood pressure is within target range for patient.. pulse regular and within target range for patient.Marland Kitchen respirations regular, non-labored and within target range for patient.Marland Kitchen temperature within target range for patient.. Well- nourished and well-hydrated in no acute distress. Eyes conjunctiva clear no eyelid edema noted. pupils equal round and reactive to light and accommodation. Ears, Nose, Mouth, and Throat no gross abnormality of ear auricles or external auditory canals. normal hearing noted during conversation. mucus membranes moist. Respiratory normal breathing without difficulty. clear to auscultation bilaterally. Cardiovascular regular rate and rhythm with normal S1, S2. Gastrointestinal (GI) soft, non-tender, non-distended, +BS. no ventral hernia noted. Musculoskeletal normal gait and posture. no significant deformity or arthritic changes, no loss or range of motion, no  clubbing. Psychiatric this patient is able to make decisions and demonstrates good insight into disease process. Alert and Oriented x 3. pleasant and cooperative. Notes Patient's wound today did require some management as far as the  skin tear is concerned I did not have to perform any debridement but I did have to manage the skin flap in order to fill this back over where it pulled away from the dermis underlying. I initially peel this back from where it had reattached in some areas but was somewhat still folded. I then was able to stretch the skin back over the opening including several areas that had counter folded in on itself. I really was able to cover most of the wound area there are a few small spots that it was not covering. My hope is that the skin will reattach and subsequently he will have much less healing to overcome as opposed to if we just trimmed this off as a whole. He tolerated this without any pain or complication which is good news. Overall once we were done I did apply Steri-Strips to hold this in place and I am hopeful that this will continue to heal quite nicely we will see what it looks like next week. Electronic Signature(s) Signed: 10/23/2019 5:11:02 PM By: Worthy Keeler PA-C Entered By: Worthy Keeler on 10/23/2019 17:11:02 [name] ([id number]) -------------------------------------------------------------------------------- Physician Orders Details Patient Name: [name]. Date of Service: 10/23/2019 3:30 PM Medical Record Number: [id number] Patient Account Number: [id number] Date of Birth/Sex: 10-16-1932 (85 y.o. M) Treating RN: Cornell Barman Primary Care Provider: Derinda Late Other Clinician: Referring Provider: Referral, Self Treating Provider/Extender: STONE III, HOYT Weeks in Treatment: 0 Verbal / Phone Orders: No Diagnosis Coding ICD-10 Coding Code Description S51.801A Unspecified open wound of right forearm, initial encounter I10  Essential (primary) hypertension Wound Cleansing Wound #1 Right Forearm o Clean wound with Normal Saline. Anesthetic (add to Medication List) Wound #1 Right Forearm o Topical Lidocaine 4% cream applied to wound bed prior to debridement (In Clinic Only). Skin Barriers/Peri-Wound Care Wound #1 Right Forearm o Skin Prep Primary Wound Dressing Wound #1 Right Forearm o Xeroform Secondary Dressing Wound #1 Right Forearm o Gauze and Kerlix/Conform Dressing Change Frequency Wound #1 Right Forearm o Other: - Monday or if soiled before Follow-up Appointments Wound #1 Right Forearm o Return Appointment in 1 week. Electronic Signature(s) Signed: 10/23/2019 5:16:54 PM By: Worthy Keeler PA-C Signed: 10/23/2019 5:32:08 PM By: Gretta Cool, BSN, RN, CWS, Kim RN, BSN Entered By: Gretta Cool, BSN, RN, CWS, Kim on 10/23/2019 17:07:21 SHAYLEN, KRUGMAN ([id number]) -------------------------------------------------------------------------------- Problem List Details Patient Name: KYALL, WESBY. Date of Service: 10/23/2019 3:30 PM Medical Record Number: [id number] Patient Account Number: [id number] Date of Birth/Sex: 07/27/1932 (85 y.o. M) Treating RN: Army Melia Primary Care Provider: Derinda Late Other Clinician: Referring Provider: Referral, Self Treating Provider/Extender: Melburn Hake, HOYT Weeks in Treatment: 0 Active Problems ICD-10 Evaluated Encounter Code Description Active Date Today Diagnosis S51.801A Unspecified open wound of right forearm, initial encounter 10/23/2019 No Yes I10 Essential (primary) hypertension 10/23/2019 No Yes Inactive Problems Resolved Problems Electronic Signature(s) Signed: 10/23/2019 4:47:35 PM By: Worthy Keeler PA-C Entered By: Worthy Keeler on 10/23/2019 16:47:35 Knope, Justin Mend ([id number]) -------------------------------------------------------------------------------- Progress Note Details Patient Name: [name]. Date of  Service: 10/23/2019 3:30 PM Medical Record Number: [id number] Patient Account Number: [id number] Date of Birth/Sex: [birth year] (85 y.o. M) Treating RN: Army Melia Primary Care Provider: Derinda Late Other Clinician: Referring Provider: Referral, Self Treating Provider/Extender: Melburn Hake, HOYT Weeks in Treatment: 0 Subjective Chief Complaint Information obtained from Patient Right forearm skin tear History of Present Illness (HPI) 10/23/2019 on evaluation today patient appears to be doing poorly in regard  to the right forearm skin tear which occurred yesterday when he was playing golf. He tells me that he was going down a hill and unfortunately slipped it was an area he was not supposed to be in. He was going to just play one whole after doing some practice shots. Fortunately he did not have any severe injuries but unfortunately he does have a skin tear that obviously is needing to be managed at this point. He does have hypertension which is fairly well controlled. He also has some discomfort but he tells me nothing to tremendous at this point. Fortunately there is no signs of active infection at this time. The skin unfortunately somewhat peeled back they attempted to pull this back over but have not been able to get it to fully cover the wound. I think that we will be able to once we completely unfold the skin and I explained to him we would have to roll this back and completely release it from the bed of the wound in order to get it to cover the area as a whole. He is okay with that. Patient History Information obtained from Patient. Allergies cephalexin (Reaction: diarrhea), hydrocodone (Reaction: nausea) Family History Cancer - Father, Kidney Disease - Mother, No family history of Diabetes, Heart Disease, Hereditary Spherocytosis, Hypertension, Lung Disease, Seizures, Stroke, Thyroid Problems, Tuberculosis. Social History Never smoker, Marital Status - Married, Alcohol Use -  Daily, Drug Use - No History, Caffeine Use - Never. Medical History Eyes Denies history of Cataracts, Glaucoma, Optic Neuritis Hematologic/Lymphatic Patient has history of Anemia Denies history of Hemophilia, Human Immunodeficiency Virus, Lymphedema, Sickle Cell Disease Cardiovascular Patient has history of Hypertension Denies history of Angina, Arrhythmia, Congestive Heart Failure, Coronary Artery Disease, Deep Vein Thrombosis, Hypotension, Myocardial Infarction, Peripheral Arterial Disease, Peripheral Venous Disease, Phlebitis, Vasculitis Integumentary (Skin) Denies history of History of Burn, History of pressure wounds Oncologic Denies history of Received Chemotherapy, Received Radiation CHRISTOPHOR, AMADO ([id number]) Medical And Surgical History Notes Eyes retinal detachment - 1994; 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 Eyes Denies complaints or symptoms of Dry Eyes, Vision Changes, Glasses / Contacts. Ear/Nose/Mouth/Throat Denies complaints or symptoms of Difficult clearing ears, Sinusitis. Hematologic/Lymphatic Denies complaints or symptoms of Bleeding / Clotting Disorders, Human Immunodeficiency Virus. Respiratory Denies complaints or symptoms of Chronic or frequent coughs, Shortness of Breath. Cardiovascular Denies complaints or symptoms of Chest pain, LE edema. Gastrointestinal Denies complaints or symptoms of Frequent diarrhea, Nausea, Vomiting. Endocrine Denies complaints or symptoms of Hepatitis, Thyroid disease, Polydypsia (Excessive Thirst). Genitourinary Denies complaints or symptoms of Kidney failure/ Dialysis, Incontinence/dribbling. Immunological Denies complaints or symptoms of Hives, Itching. Integumentary  (Skin) Complains or has symptoms of Wounds. Denies complaints or symptoms of Bleeding or bruising tendency, Breakdown, Swelling. Musculoskeletal Denies complaints or symptoms of Muscle Pain, Muscle Weakness. Neurologic Denies complaints or symptoms of Numbness/parasthesias, Focal/Weakness. Psychiatric Denies complaints or symptoms of Anxiety, Claustrophobia. Objective Constitutional sitting or standing blood pressure is within target range for patient.. pulse regular and within target range for patient.Marland Kitchen respirations regular, non-labored and within target range for patient.Marland Kitchen temperature within target range for patient.. Well- nourished and well-hydrated in no acute distress. Vitals Time Taken: 2:05 PM, Height: 68 in, Source: Stated, Weight: 173 lbs, Source: Measured, BMI: 26.3, Temperature: 97.7 F, Pulse: 59 bpm, Respiratory Rate: 16 breaths/min, Blood Pressure: 149/70 mmHg. Eyes  ESTEBAN, GIFFIN ([id number]) conjunctiva clear no eyelid edema noted. pupils equal round and reactive to light and accommodation. Ears, Nose, Mouth, and Throat no gross abnormality of ear auricles or external auditory canals. normal hearing noted during conversation. mucus membranes moist. Respiratory normal breathing without difficulty. clear to auscultation bilaterally. Cardiovascular regular rate and rhythm with normal S1, S2. Gastrointestinal (GI) soft, non-tender, non-distended, +BS. no ventral hernia noted. Musculoskeletal normal gait and posture. no significant deformity or arthritic changes, no loss or range of motion, no clubbing. Psychiatric this patient is able to make decisions and demonstrates good insight into disease process. Alert and Oriented x 3. pleasant and cooperative. General Notes: Patient's wound today did require some management as far as the skin tear is concerned I did not have to perform any debridement but I did have to manage the skin flap in order to fill this back over  where it pulled away from the dermis underlying. I initially peel this back from where it had reattached in some areas but was somewhat still folded. I then was able to stretch the skin back over the opening including several areas that had counter folded in on itself. I really was able to cover most of the wound area there are a few small spots that it was not covering. My hope is that the skin will reattach and subsequently he will have much less healing to overcome as opposed to if we just trimmed this off as a whole. He tolerated this without any pain or complication which is good news. Overall once we were done I did apply Steri-Strips to hold this in place and I am hopeful that this will continue to heal quite nicely we will see what it looks like next week. Integumentary (Hair, Skin) Wound #1 status is Open. Original cause of wound was Trauma. The wound is located on the Right Forearm. The wound measures 5cm length x 2.6cm width x 0.1cm depth; 10.21cm^2 area and 1.021cm^3 volume. There is Fat Layer (Subcutaneous Tissue) Exposed exposed. There is no tunneling or undermining noted. There is a medium amount of sanguinous drainage noted. The wound margin is flat and intact. There is large (67-100%) red granulation within the wound bed. There is no necrotic tissue within the wound bed. Assessment Active Problems ICD-10 Unspecified open wound of right forearm, initial encounter Essential (primary) hypertension Plan Wound Cleansing: [place], [place] ([id number]) Wound #1 Right Forearm: Clean wound with Normal Saline. Anesthetic (add to Medication List): Wound #1 Right Forearm: Topical Lidocaine 4% cream applied to wound bed prior to debridement (In Clinic Only). Skin Barriers/Peri-Wound Care: Wound #1 Right Forearm: Skin Prep Primary Wound Dressing: Wound #1 Right Forearm: Xeroform Secondary Dressing: Wound #1 Right Forearm: Gauze and Kerlix/Conform Dressing Change  Frequency: Wound #1 Right Forearm: Other: - Monday or if soiled before Follow-up Appointments: Wound #1 Right Forearm: Return Appointment in 1 week. 1. We did apply a piece of Xeroform over top of the Steri-Strips and then subsequently rolled Kerlix and then secured with netting. This will apply some cover as well for protection. 2. With regard to changing the dressing if they need to change this as it is draining through or is having any bleeding they do have some extra supplies with which to do so. Otherwise we can see how things are doing next week and then subsequently will make any adjustments in care as we need to. I may remove the Steri-Strips and reapply a second set her we may just remove this  as a whole and just use the Xeroform. A lot will depend on how things are progressing my hope is we will not need to debride away any of the skin flap. Fortunately this was not too deep of a skin tear that should bode well for his healing. We will see patient back for reevaluation in 1 week here in the clinic. If anything worsens or changes patient will contact our office for additional recommendations. Electronic Signature(s) Signed: 10/23/2019 5:12:05 PM By: Worthy Keeler PA-C Entered By: Worthy Keeler on 10/23/2019 17:12:05 ELCHONON, SEDIVY ([id number]) -------------------------------------------------------------------------------- ROS/PFSH Details Patient Name: [name] Date of Service: 10/23/2019 3:30 PM Medical Record Number: [id number] Patient Account Number: [id number] Date of Birth/Sex: 08/01/1932 (85 y.o. M) Treating RN: Montey Hora Primary Care Provider: Derinda Late Other Clinician: Referring Provider: Referral, Self Treating Provider/Extender: STONE III, HOYT Weeks in Treatment: 0 Information Obtained From Patient Constitutional Symptoms (General Health) Complaints and Symptoms: Negative for: Fatigue; Fever; Chills; Marked Weight  Change Eyes Complaints and Symptoms: Negative for: Dry Eyes; Vision Changes; Glasses / Contacts Medical History: Negative for: Cataracts; Glaucoma; Optic Neuritis Past Medical History Notes: retinal detachment - 1994; prosthetic left eye Ear/Nose/Mouth/Throat Complaints and Symptoms: Negative for: Difficult clearing ears; Sinusitis Hematologic/Lymphatic Complaints and Symptoms: Negative for: Bleeding / Clotting Disorders; Human Immunodeficiency Virus Medical History: Positive for: Anemia Negative for: Hemophilia; Human Immunodeficiency Virus; Lymphedema; Sickle Cell Disease Past Medical History Notes: idiopathic thrombocytopenic purpura; autoimmune hemolytic anemia Respiratory Complaints and Symptoms: Negative for: Chronic or frequent coughs; Shortness of Breath Cardiovascular Complaints and Symptoms: Negative for: Chest pain; LE edema Medical History: Positive for: Hypertension Negative for: Angina; Arrhythmia; Congestive Heart Failure; Coronary Artery Disease; Deep Vein Thrombosis; Hypotension; Myocardial Infarction; Peripheral Arterial Disease; Peripheral Venous Disease; Phlebitis; Vasculitis Gastrointestinal Engen, Yoel C. ([id number]) Complaints and Symptoms: Negative for: Frequent diarrhea; Nausea; Vomiting Endocrine Complaints and Symptoms: Negative for: Hepatitis; Thyroid disease; Polydypsia (Excessive Thirst) Genitourinary Complaints and Symptoms: Negative for: Kidney failure/ Dialysis; Incontinence/dribbling Immunological Complaints and Symptoms: Negative for: Hives; Itching Integumentary (Skin) Complaints and Symptoms: Positive for: Wounds Negative for: Bleeding or bruising tendency; Breakdown; Swelling Medical History: Negative for: History of Burn; History of pressure wounds Musculoskeletal Complaints and Symptoms: Negative for: Muscle Pain; Muscle Weakness Neurologic Complaints and Symptoms: Negative for: Numbness/parasthesias;  Focal/Weakness Psychiatric Complaints and Symptoms: Negative for: Anxiety; Claustrophobia Oncologic Medical History: Negative for: Received Chemotherapy; Received Radiation Past Medical History Notes: prostate cancer - TURP 2016 Immunizations Pneumococcal Vaccine: Received Pneumococcal Vaccination: Yes Implantable Devices None Family and Social History Cancer: Yes - Father; Diabetes: No; Heart Disease: No; Hereditary Spherocytosis: No; Hypertension: No; Kidney Disease: Yes - Mother; Lung Disease: No; Seizures: No; Stroke: No; Thyroid Problems: No; Tuberculosis: No; Never smoker; Marital MALEEK, BOUGHTON. ([id number]) Status - Married; Alcohol Use: Daily; Drug Use: No History; Caffeine Use: Never; Financial Concerns: No; Food, Clothing or Shelter Needs: No; Support System Lacking: No; Transportation Concerns: No Electronic Signature(s) Signed: 10/23/2019 5:00:08 PM By: Montey Hora Signed: 10/23/2019 5:16:54 PM By: Worthy Keeler PA-C Entered By: Montey Hora on 10/23/2019 [place], [place] ([id number]) -------------------------------------------------------------------------------- SuperBill Details Patient Name: [name]. Date of Service: 10/23/2019 Medical Record Number: [id number] Patient Account Number: [id number] Date of Birth/Sex: 06/15/1932 (85 y.o. M) Treating RN: Army Melia Primary Care Provider: Derinda Late Other Clinician: Referring Provider: Referral, Self Treating Provider/Extender: STONE III, HOYT Weeks in Treatment: 0 Diagnosis Coding ICD-10 Codes Code Description S51.801A Unspecified open wound of right forearm, initial encounter I10 Essential (primary) hypertension  Facility Procedures CPT4 Code: [id number] Description: [id number] - WOUND CARE VISIT-LEV 4 EST PT Modifier: Quantity: 1 Physician Procedures CPT4 Code: [id number] Description: [id number] - WC PHYS LEVEL 4 - NEW PT ICD-10 Diagnosis Description [id number] Unspecified open wound of right  forearm, initial encounte I10 Essential (primary) hypertension Modifier: r Quantity: 1 Electronic Signature(s) Signed: 10/23/2019 5:12:20 PM By: Worthy Keeler PA-C Entered By: Worthy Keeler on 10/23/2019 17:12:20

## 2019-10-24 NOTE — Progress Notes (Signed)
CORINNE, BAYLESS ([id number]) Visit Report for 10/23/2019 Allergy List Details Patient Name: [name], [name]. Date of Service: 10/23/2019 3:30 PM Medical Record Number: [id number] Patient Account Number: [id number] Date of Birth/Sex: [birth year] (85 y.o. M) Treating RN: Montey Hora Primary Care Koby Hartfield: Derinda Late Other Clinician: Referring Yvana Samonte: Referral, Self Treating Tiphani Mells/Extender: STONE III, HOYT Weeks in Treatment: 0 Allergies Active Allergies cephalexin Reaction: diarrhea hydrocodone Reaction: nausea Allergy Notes Electronic Signature(s) Signed: 10/23/2019 5:00:08 PM By: Montey Hora Entered By: Montey Hora on 10/23/2019 16:13:57 [name] ([id number]) -------------------------------------------------------------------------------- Arrival Information Details Patient Name: [name]. Date of Service: 10/23/2019 3:30 PM Medical Record Number: [id number] Patient Account Number: [id number] Date of Birth/Sex: 02-16-1932 (85 y.o. M) Treating RN: Army Melia Primary Care Lynford Espinoza: Derinda Late Other Clinician: Referring Cloa Bushong: Referral, Self Treating Tyra Gural/Extender: Melburn Hake, HOYT Weeks in Treatment: 0 Visit Information Patient Arrived: Ambulatory Arrival Time: 16:04 Accompanied By: wife Transfer Assistance: None Patient Identification Verified: Yes Secondary Verification Process Completed: Yes Electronic Signature(s) Signed: 10/23/2019 4:37:51 PM By: Lorine Bears RCP, RRT, CHT Entered By: Lorine Bears on 10/23/2019 16:04:38 [name] ([id number]) -------------------------------------------------------------------------------- Clinic Level of Care Assessment Details Patient Name: [name], [name]. Date of Service: 10/23/2019 3:30 PM Medical Record Number: [id number] Patient Account Number: [id number] Date of Birth/Sex: 08-Apr-1932 (85 y.o. M) Treating RN: Cornell Barman Primary Care  Amina Menchaca: Derinda Late Other Clinician: Referring Tacoya Altizer: Referral, Self Treating Shelle Galdamez/Extender: STONE III, HOYT Weeks in Treatment: 0 Clinic Level of Care Assessment Items TOOL 2 Quantity Score []  - Use when only an EandM is performed on the INITIAL visit 0 ASSESSMENTS - Nursing Assessment / Reassessment X - General Physical Exam (combine w/ comprehensive assessment (listed just below) when 1 20 performed on new pt. evals) X- 1 25 Comprehensive Assessment (HX, ROS, Risk Assessments, Wounds Hx, etc.) ASSESSMENTS - Wound and Skin Assessment / Reassessment X - Simple Wound Assessment / Reassessment - one wound 1 5 []  - 0 Complex Wound Assessment / Reassessment - multiple wounds []  - 0 Dermatologic / Skin Assessment (not related to wound area) ASSESSMENTS - Ostomy and/or Continence Assessment and Care []  - Incontinence Assessment and Management 0 []  - 0 Ostomy Care Assessment and Management (repouching, etc.) PROCESS - Coordination of Care X - Simple Patient / Family Education for ongoing care 1 15 []  - 0 Complex (extensive) Patient / Family Education for ongoing care []  - 0 Staff obtains Programmer, systems, Records, Test Results / Process Orders []  - 0 Staff telephones HHA, Nursing Homes / Clarify orders / etc []  - 0 Routine Transfer to another Facility (non-emergent condition) []  - 0 Routine Hospital Admission (non-emergent condition) X- 1 15 New Admissions / Biomedical engineer / Ordering NPWT, Apligraf, etc. []  - 0 Emergency Hospital Admission (emergent condition) X- 1 10 Simple Discharge Coordination []  - 0 Complex (extensive) Discharge Coordination PROCESS - Special Needs []  - Pediatric / Minor Patient Management 0 []  - 0 Isolation Patient Management [name], [name]. ([id number]) []  - 0 Hearing / Language / Visual special needs []  - 0 Assessment of Community assistance (transportation, D/C planning, etc.) []  - 0 Additional assistance / Altered  mentation []  - 0 Support Surface(s) Assessment (bed, cushion, seat, etc.) INTERVENTIONS - Wound Cleansing / Measurement X - Wound Imaging (photographs - any number of wounds) 1 5 []  - 0 Wound Tracing (instead of photographs) X- 1 5 Simple Wound Measurement - one wound []  - 0 Complex Wound Measurement - multiple wounds X- 1 5 Simple Wound Cleansing -  one wound []  - 0 Complex Wound Cleansing - multiple wounds INTERVENTIONS - Wound Dressings []  - Small Wound Dressing one or multiple wounds 0 []  - 0 Medium Wound Dressing one or multiple wounds X- 1 20 Large Wound Dressing one or multiple wounds []  - 0 Application of Medications - injection INTERVENTIONS - Miscellaneous []  - External ear exam 0 []  - 0 Specimen Collection (cultures, biopsies, blood, body fluids, etc.) []  - 0 Specimen(s) / Culture(s) sent or taken to Lab for analysis []  - 0 Patient Transfer (multiple staff / Civil Service fast streamer / Similar devices) []  - 0 Simple Staple / Suture removal (25 or less) []  - 0 Complex Staple / Suture removal (26 or more) []  - 0 Hypo / Hyperglycemic Management (close monitor of Blood Glucose) []  - 0 Ankle / Brachial Index (ABI) - do not check if billed separately Has the patient been seen at the hospital within the last three years: Yes Total Score: 125 Level Of Care: New/Established - Level 4 Electronic Signature(s) Signed: 10/23/2019 5:32:08 PM By: Gretta Cool, BSN, RN, CWS, Kim RN, BSN Entered By: Gretta Cool, BSN, RN, CWS, Kim on 10/23/2019 17:11:03 [name] ([id number]) -------------------------------------------------------------------------------- Encounter Discharge Information Details Patient Name: Keith Gutting C. Date of Service: 10/23/2019 3:30 PM Medical Record Number: [id number] Patient Account Number: [id number] Date of Birth/Sex: Mar 04, 1932 (85 y.o. M) Treating RN: Cornell Barman Primary Care Parrie Rasco: Derinda Late Other Clinician: Referring Keyion Knack: Referral, Self Treating  Damica Gravlin/Extender: Melburn Hake, HOYT Weeks in Treatment: 0 Encounter Discharge Information Items Discharge Condition: Stable Ambulatory Status: Ambulatory Discharge Destination: Home Transportation: Private Auto Accompanied By: self Schedule Follow-up Appointment: Yes Clinical Summary of Care: Electronic Signature(s) Signed: 10/23/2019 5:32:08 PM By: Gretta Cool, BSN, RN, CWS, Kim RN, BSN Entered By: Gretta Cool, BSN, RN, CWS, Kim on 10/23/2019 17:11:23 [name] ([id number]) -------------------------------------------------------------------------------- Lower Extremity Assessment Details Patient Name: [name], [name]. Date of Service: 10/23/2019 3:30 PM Medical Record Number: [id number] Patient Account Number: [id number] Date of Birth/Sex: 06/12/1932 (85 y.o. M) Treating RN: Montey Hora Primary Care Alvia Tory: Derinda Late Other Clinician: Referring Katurah Karapetian: Referral, Self Treating Aysha Livecchi/Extender: STONE III, HOYT Weeks in Treatment: 0 Electronic Signature(s) Signed: 10/23/2019 5:00:08 PM By: Montey Hora Entered By: Montey Hora on 10/23/2019 16:21:03 [name] ([id number]) -------------------------------------------------------------------------------- Multi Wound Chart Details Patient Name: Keith Gutting C. Date of Service: 10/23/2019 3:30 PM Medical Record Number: [id number] Patient Account Number: [id number] Date of Birth/Sex: 02-Nov-1932 (85 y.o. M) Treating RN: Cornell Barman Primary Care Kasondra Junod: Derinda Late Other Clinician: Referring Sagan Wurzel: Referral, Self Treating Kaitlynn Tramontana/Extender: STONE III, HOYT Weeks in Treatment: 0 Vital Signs Height(in): 68 Pulse(bpm): 48 Weight(lbs): 173 Blood Pressure(mmHg): 149/70 Body Mass Index(BMI): 26 Temperature(F): 97.7 Respiratory Rate 16 (breaths/min): Photos: [N/A:N/A] Wound Location: Right Forearm N/A N/A Wounding Event: Trauma N/A N/A Primary Etiology: Skin Tear N/A N/A Comorbid History: Anemia,  Hypertension N/A N/A Date Acquired: 10/22/2019 N/A N/A Weeks of Treatment: 0 N/A N/A Wound Status: Open N/A N/A Measurements L x W x D 5x2.6x0.1 N/A N/A (cm) Area (cm) : 10.21 N/A N/A Volume (cm) : 1.021 N/A N/A Classification: Full Thickness Without N/A N/A Exposed Support Structures Exudate Amount: Medium N/A N/A Exudate Type: Sanguinous N/A N/A Exudate Color: red N/A N/A Wound Margin: Flat and Intact N/A N/A Granulation Amount: Large (67-100%) N/A N/A Granulation Quality: Red N/A N/A Necrotic Amount: None Present (0%) N/A N/A Exposed Structures: Fat Layer (Subcutaneous N/A N/A Tissue) Exposed: Yes Fascia: No Tendon: No Muscle: No Joint: No Bone: No Epithelialization: None N/A N/A [place],  CLETIS STEVEN ([id number]) Treatment Notes Electronic Signature(s) Signed: 10/23/2019 5:32:08 PM By: Gretta Cool, BSN, RN, CWS, Kim RN, BSN Entered By: Gretta Cool, BSN, RN, CWS, Kim on 10/23/2019 16:53:28 [name] ([id number]) -------------------------------------------------------------------------------- Multi-Disciplinary Care Plan Details Patient Name: [name], [name]. Date of Service: 10/23/2019 3:30 PM Medical Record Number: [id number] Patient Account Number: [id number] Date of Birth/Sex: September 05, 1932 (85 y.o. M) Treating RN: Cornell Barman Primary Care Myracle Febres: Derinda Late Other Clinician: Referring Jerauld Bostwick: Referral, Self Treating Cordel Drewes/Extender: STONE III, HOYT Weeks in Treatment: 0 Active Inactive Abuse / Safety / Falls / Self Care Management Nursing Diagnoses: History of Falls Potential for falls Goals: Patient will not experience any injury related to falls Date Initiated: 10/23/2019 Target Resolution Date: 10/23/2019 Goal Status: Active Interventions: Assess fall risk on admission and as needed Notes: Orientation to the Wound Care Program Nursing Diagnoses: Knowledge deficit related to the wound healing center program Goals: Patient/caregiver will verbalize  understanding of the [facility] Date Initiated: 10/23/2019 Target Resolution Date: 10/23/2019 Goal Status: Active Interventions: Provide education on orientation to the wound center Notes: Wound/Skin Impairment Nursing Diagnoses: Impaired tissue integrity Goals: Patient/caregiver will verbalize understanding of skin care regimen Date Initiated: 10/23/2019 Target Resolution Date: 10/22/2019 Goal Status: Active Ulcer/skin breakdown will have a volume reduction of 30% by week 4 Date Initiated: 10/23/2019 Target Resolution Date: 11/23/2019 [name], [name] ([id number]) Goal Status: Active Interventions: Assess ulceration(s) every visit Treatment Activities: Refer to smoking cessation program : 10/23/2019 Notes: Electronic Signature(s) Signed: 10/23/2019 5:32:08 PM By: Gretta Cool, BSN, RN, CWS, Kim RN, BSN Entered By: Gretta Cool, BSN, RN, CWS, Kim on 10/23/2019 16:52:56 [name], [name] ([id number]) -------------------------------------------------------------------------------- Pain Assessment Details Patient Name: [name]. Date of Service: 10/23/2019 3:30 PM Medical Record Number: [id number] Patient Account Number: [id number] Date of Birth/Sex: February 17, 1932 (85 y.o. M) Treating RN: Army Melia Primary Care Mekia Dipinto: Derinda Late Other Clinician: Referring Andrell Bergeson: Referral, Self Treating Koki Buxton/Extender: STONE III, HOYT Weeks in Treatment: 0 Active Problems Location of Pain Severity and Description of Pain Patient Has Paino No Site Locations Pain Management and Medication Current Pain Management: Electronic Signature(s) Signed: 10/23/2019 4:37:51 PM By: Paulla Fore, RRT, CHT Signed: 10/23/2019 4:43:12 PM By: Army Melia Entered By: Lorine Bears on 10/23/2019 16:06:23 [name] ([id number]) -------------------------------------------------------------------------------- Patient/Caregiver Education  Details Patient Name: [name]. Date of Service: 10/23/2019 3:30 PM Medical Record Number: [id number] Patient Account Number: [id number] Date of Birth/Gender: 05-Feb-1932 (85 y.o. M) Treating RN: Cornell Barman Primary Care Physician: Derinda Late Other Clinician: Referring Physician: Referral, Self Treating Physician/Extender: Melburn Hake, HOYT Weeks in Treatment: 0 Education Assessment Education Provided To: Patient Education Topics Provided Welcome To [facility]: Handouts: Welcome To [facility] Wound/Skin Impairment: Handouts: Caring for Your Ulcer Methods: Demonstration, Explain/Verbal Responses: State content correctly Electronic Signature(s) Signed: 10/23/2019 5:32:08 PM By: Gretta Cool, BSN, RN, CWS, Kim RN, BSN Entered By: Gretta Cool, BSN, RN, CWS, Kim on 10/23/2019 17:08:39 [name], [name] ([id number]) -------------------------------------------------------------------------------- Wound Assessment Details Patient Name: [name], [name]. Date of Service: 10/23/2019 3:30 PM Medical Record Number: [id number] Patient Account Number: [id number] Date of Birth/Sex: [birth year] (85 y.o. M) Treating RN: Montey Hora Primary Care Samael Blades: Derinda Late Other Clinician: Referring Kalijah Zeiss: Referral, Self Treating Thelbert Gartin/Extender: STONE III, HOYT Weeks in Treatment: 0 Wound Status Wound Number: 1 Primary Etiology: Skin Tear Wound Location: Right Forearm Wound Status: Open Wounding Event: Trauma Comorbid History: Anemia, Hypertension Date Acquired: 10/22/2019 Weeks Of Treatment: 0 Clustered Wound: No Photos Wound Measurements Length: (  cm) 5 % Reduct Width: (cm) 2.6 % Reduct Depth: (cm) 0.1 Epitheli Area: (cm) 10.21 Tunneli Volume: (cm) 1.021 Undermi ion in Area: ion in Volume: alization: None ng: No ning: No Wound Description Full Thickness Without Exposed Support Foul Odo Classification: Structures Slough/F Wound Margin: Flat and  Intact Exudate Medium Amount: Exudate Type: Sanguinous Exudate Color: red r After Cleansing: No ibrino No Wound Bed Granulation Amount: Large (67-100%) Exposed Structure Granulation Quality: Red Fascia Exposed: No Necrotic Amount: None Present (0%) Fat Layer (Subcutaneous Tissue) Exposed: Yes Tendon Exposed: No Muscle Exposed: No Joint Exposed: No Bone Exposed: No [name], Keith C. ([id number]) Treatment Notes Wound #1 (Right Forearm) 1. Cleansed with: Clean wound with Normal Saline 2. Anesthetic Topical Lidocaine 4% cream to wound bed prior to debridement 3. Peri-wound Care: Skin Prep 4. Dressing Applied: Xeroform 5. Secondary Dressing Applied Gauze and Kerlix/Conform Notes Secured with steristrips Electronic Signature(s) Signed: 10/23/2019 5:00:08 PM By: Montey Hora Entered By: Montey Hora on 10/23/2019 16:30:13 [name] ([id number]) -------------------------------------------------------------------------------- [place] Details Patient Name: [name]. Date of Service: 10/23/2019 3:30 PM Medical Record Number: [id number] Patient Account Number: [id number] Date of Birth/Sex: 09-Sep-1932 (85 y.o. M) Treating RN: Army Melia Primary Care Gavyn Ybarra: Derinda Late Other Clinician: Referring Demarri Elie: Referral, Self Treating Cherylin Waguespack/Extender: STONE III, HOYT Weeks in Treatment: 0 Vital Signs Time Taken: 14:05 Temperature (F): 97.7 Height (in): 68 Pulse (bpm): 59 Source: Stated Respiratory Rate (breaths/min): 16 Weight (lbs): 173 Blood Pressure (mmHg): 149/70 Source: Measured Reference Range: 80 - 120 mg / dl Body Mass Index (BMI): 26.3 Electronic Signature(s) Signed: 10/23/2019 4:37:51 PM By: Lorine Bears RCP, RRT, CHT Entered By: Lorine Bears on 10/23/2019 16:08:03

## 2019-10-27 ENCOUNTER — Inpatient Hospital Stay: Payer: PPO | Attending: Internal Medicine

## 2019-10-27 ENCOUNTER — Other Ambulatory Visit: Payer: Self-pay

## 2019-10-27 DIAGNOSIS — Z79899 Other long term (current) drug therapy: Secondary | ICD-10-CM | POA: Diagnosis not present

## 2019-10-27 DIAGNOSIS — D693 Immune thrombocytopenic purpura: Secondary | ICD-10-CM | POA: Diagnosis not present

## 2019-10-27 DIAGNOSIS — Z0000 Encounter for general adult medical examination without abnormal findings: Secondary | ICD-10-CM | POA: Diagnosis not present

## 2019-10-27 LAB — CBC WITH DIFFERENTIAL/PLATELET
Abs Immature Granulocytes: 0.02 10*3/uL (ref 0.00–0.07)
Basophils Absolute: 0 10*3/uL (ref 0.0–0.1)
Basophils Relative: 1 %
Eosinophils Absolute: 0.1 10*3/uL (ref 0.0–0.5)
Eosinophils Relative: 1 %
HCT: 40.6 % (ref 39.0–52.0)
Hemoglobin: 13.3 g/dL (ref 13.0–17.0)
Immature Granulocytes: 0 %
Lymphocytes Relative: 20 %
Lymphs Abs: 1.5 10*3/uL (ref 0.7–4.0)
MCH: 33.1 pg (ref 26.0–34.0)
MCHC: 32.8 g/dL (ref 30.0–36.0)
MCV: 101 fL — ABNORMAL HIGH (ref 80.0–100.0)
Monocytes Absolute: 0.6 10*3/uL (ref 0.1–1.0)
Monocytes Relative: 8 %
Neutro Abs: 5.5 10*3/uL (ref 1.7–7.7)
Neutrophils Relative %: 70 %
Platelets: 284 10*3/uL (ref 150–400)
RBC: 4.02 MIL/uL — ABNORMAL LOW (ref 4.22–5.81)
RDW: 13 % (ref 11.5–15.5)
WBC: 7.8 10*3/uL (ref 4.0–10.5)
nRBC: 0 % (ref 0.0–0.2)

## 2019-10-28 DIAGNOSIS — L821 Other seborrheic keratosis: Secondary | ICD-10-CM | POA: Diagnosis not present

## 2019-10-28 DIAGNOSIS — X32XXXA Exposure to sunlight, initial encounter: Secondary | ICD-10-CM | POA: Diagnosis not present

## 2019-10-28 DIAGNOSIS — L820 Inflamed seborrheic keratosis: Secondary | ICD-10-CM | POA: Diagnosis not present

## 2019-10-28 DIAGNOSIS — D2262 Melanocytic nevi of left upper limb, including shoulder: Secondary | ICD-10-CM | POA: Diagnosis not present

## 2019-10-28 DIAGNOSIS — D2261 Melanocytic nevi of right upper limb, including shoulder: Secondary | ICD-10-CM | POA: Diagnosis not present

## 2019-10-28 DIAGNOSIS — D2271 Melanocytic nevi of right lower limb, including hip: Secondary | ICD-10-CM | POA: Diagnosis not present

## 2019-10-28 DIAGNOSIS — L570 Actinic keratosis: Secondary | ICD-10-CM | POA: Diagnosis not present

## 2019-10-28 DIAGNOSIS — D225 Melanocytic nevi of trunk: Secondary | ICD-10-CM | POA: Diagnosis not present

## 2019-10-28 DIAGNOSIS — D2272 Melanocytic nevi of left lower limb, including hip: Secondary | ICD-10-CM | POA: Diagnosis not present

## 2019-10-30 ENCOUNTER — Encounter: Payer: PPO | Admitting: Physician Assistant

## 2019-10-30 ENCOUNTER — Other Ambulatory Visit: Payer: Self-pay

## 2019-10-30 DIAGNOSIS — L98499 Non-pressure chronic ulcer of skin of other sites with unspecified severity: Secondary | ICD-10-CM | POA: Diagnosis not present

## 2019-10-30 DIAGNOSIS — L98492 Non-pressure chronic ulcer of skin of other sites with fat layer exposed: Secondary | ICD-10-CM | POA: Diagnosis not present

## 2019-10-30 DIAGNOSIS — S51801A Unspecified open wound of right forearm, initial encounter: Secondary | ICD-10-CM | POA: Diagnosis not present

## 2019-10-30 NOTE — Progress Notes (Addendum)
LOVELACE, KNUTESON ([id number]) Visit Report for 10/30/2019 Chief Complaint Document Details Patient Name: [name], [name]. Date of Service: 10/30/2019 12:45 PM Medical Record Number: [id number] Patient Account Number: [id number] Date of Birth/Sex: 27-Sep-1932 (85 y.o. M) Treating RN: Army Melia Primary Care Provider: Derinda Late Other Clinician: Referring Provider: BABAOFF, MARCUS Treating Provider/Extender: STONE III, Koden Hunzeker Weeks in Treatment: 1 Information Obtained from: Patient Chief Complaint Right forearm skin tear Electronic Signature(s) Signed: 10/30/2019 1:19:38 PM By: Worthy Keeler PA-C Entered By: Worthy Keeler on 10/30/2019 13:19:38 Olesky, Justin Mend ([id number]) -------------------------------------------------------------------------------- Debridement Details Patient Name: [name]. Date of Service: 10/30/2019 12:45 PM Medical Record Number: [id number] Patient Account Number: [id number] Date of Birth/Sex: 1932/05/28 (85 y.o. M) Treating RN: Army Melia Primary Care Provider: BABAOFF, MARCUS Other Clinician: Referring Provider: BABAOFF, MARCUS Treating Provider/Extender: STONE III, Jaliyah Fotheringham Weeks in Treatment: 1 Debridement Performed for Wound #1 Right Forearm Assessment: Performed By: Physician STONE III, Ikesha Siller E., PA-C Debridement Type: Chemical/Enzymatic/Mechanical Agent Used: saline and gauze Level of Consciousness (Pre- Awake and Alert procedure): Pre-procedure Verification/Time Yes - 13:33 Out Taken: Start Time: 13:33 Pain Control: Lidocaine Instrument: Other : saline and gauze Bleeding: None End Time: 13:34 Response to Treatment: Procedure was tolerated well Level of Consciousness Awake and Alert (Post-procedure): Post Debridement Measurements of Total Wound Length: (cm) 5 Width: (cm) 2.4 Depth: (cm) 0.1 Volume: (cm) 0.942 Character of Wound/Ulcer Post Debridement: Stable Post Procedure Diagnosis Same as  Pre-procedure Electronic Signature(s) Signed: 10/30/2019 4:29:54 PM By: Army Melia Signed: 10/30/2019 6:18:38 PM By: Worthy Keeler PA-C Entered By: Army Melia on 10/30/2019 13:35:27 [name] ([id number]) -------------------------------------------------------------------------------- HPI Details Patient Name: [name]. Date of Service: 10/30/2019 12:45 PM Medical Record Number: [id number] Patient Account Number: [id number] Date of Birth/Sex: 03/09/1932 (85 y.o. M) Treating RN: Army Melia Primary Care Provider: Derinda Late Other Clinician: Referring Provider: BABAOFF, MARCUS Treating Provider/Extender: Melburn Hake, Joury Allcorn Weeks in Treatment: 1 History of Present Illness HPI Description: 10/23/2019 on evaluation today patient appears to be doing poorly in regard to the right forearm skin tear which occurred yesterday when he was playing golf. He tells me that he was going down a hill and unfortunately slipped it was an area he was not supposed to be in. He was going to just play one whole after doing some practice shots. Fortunately he did not have any severe injuries but unfortunately he does have a skin tear that obviously is needing to be managed at this point. He does have hypertension which is fairly well controlled. He also has some discomfort but he tells me nothing to tremendous at this point. Fortunately there is no signs of active infection at this time. The skin unfortunately somewhat peeled back they attempted to pull this back over but have not been able to get it to fully cover the wound. I think that we will be able to once we completely unfold the skin and I explained to him we would have to roll this back and completely release it from the bed of the wound in order to get it to cover the area as a whole. He is okay with that. 10/30/2019 upon evaluation today patient actually appears to be doing quite well with regard to his forearm ulcer. He has been  tolerating the dressing changes without complication. Fortunately there is no signs of active infection at this time. No fevers, chills, nausea, vomiting, or diarrhea. Overall a lot of the tissue did reattach to the base of the wound  although he does have some superficial epidermal tissue that is sloughing off underneath this has reattached quite nicely and seems to have healed nonetheless. There is still some open wound areas. His dressing was quite stuck likely due to bleeding. Electronic Signature(s) Signed: 11/03/2019 10:12:39 AM By: Worthy Keeler PA-C Previous Signature: 10/30/2019 2:00:16 PM Version By: Worthy Keeler PA-C Entered By: Worthy Keeler on 11/03/2019 10:12:38 [name] ([id number]) -------------------------------------------------------------------------------- Physical Exam Details Patient Name: [name]. Date of Service: 10/30/2019 12:45 PM Medical Record Number: [id number] Patient Account Number: [id number] Date of Birth/Sex: [birth year] (85 y.o. M) Treating RN: Army Melia Primary Care Provider: Derinda Late Other Clinician: Referring Provider: BABAOFF, MARCUS Treating Provider/Extender: STONE III, Quentez Lober Weeks in Treatment: 1 Constitutional Well-nourished and well-hydrated in no acute distress. Respiratory normal breathing without difficulty. clear to auscultation bilaterally. Cardiovascular regular rate and rhythm with normal S1, S2. Psychiatric this patient is able to make decisions and demonstrates good insight into disease process. Alert and Oriented x 3. pleasant and cooperative. Notes His wound bed currently showed signs of good granulation at this time and epithelization as well. Improvement with regard. Fortunately there is no evidence of active infection at this time which is good news. No fevers, chills, nausea, vomiting, or diarrhea. Electronic Signature(s) Signed: 10/30/2019 2:00:40 PM By: Worthy Keeler PA-C Entered By:  Worthy Keeler on 10/30/2019 14:00:39 [name] ([id number]) -------------------------------------------------------------------------------- Physician Orders Details Patient Name: [name]. Date of Service: 10/30/2019 12:45 PM Medical Record Number: [id number] Patient Account Number: [id number] Date of Birth/Sex: Jan 04, 1932 (85 y.o. M) Treating RN: Army Melia Primary Care Provider: Derinda Late Other Clinician: Referring Provider: BABAOFF, MARCUS Treating Provider/Extender: STONE III, Dason Mosley Weeks in Treatment: 1 Verbal / Phone Orders: No Diagnosis Coding ICD-10 Coding Code Description S51.801A Unspecified open wound of right forearm, initial encounter I10 Essential (primary) hypertension Wound Cleansing Wound #1 Right Forearm o Clean wound with Normal Saline. Anesthetic (add to Medication List) Wound #1 Right Forearm o Topical Lidocaine 4% cream applied to wound bed prior to debridement (In Clinic Only). Skin Barriers/Peri-Wound Care Wound #1 Right Forearm o Skin Prep Primary Wound Dressing Wound #1 Right Forearm o Xeroform Secondary Dressing Wound #1 Right Forearm o Gauze and Kerlix/Conform Dressing Change Frequency Wound #1 Right Forearm o Change dressing every other day. Follow-up Appointments Wound #1 Right Forearm o Return Appointment in 1 week. Electronic Signature(s) Signed: 10/30/2019 4:29:54 PM By: Army Melia Signed: 10/30/2019 6:18:38 PM By: Worthy Keeler PA-C Entered By: Army Melia on 10/30/2019 13:32:14 KAIAN, GUZMAN ([id number]) -------------------------------------------------------------------------------- Problem List Details Patient Name: SHAMMAH, BORNT. Date of Service: 10/30/2019 12:45 PM Medical Record Number: [id number] Patient Account Number: [id number] Date of Birth/Sex: 09/25/1932 (85 y.o. M) Treating RN: Army Melia Primary Care Provider: Derinda Late Other Clinician: Referring Provider:  BABAOFF, MARCUS Treating Provider/Extender: Melburn Hake, Chaze Hruska Weeks in Treatment: 1 Active Problems ICD-10 Evaluated Encounter Code Description Active Date Today Diagnosis S51.801A Unspecified open wound of right forearm, initial encounter 10/23/2019 No Yes I10 Essential (primary) hypertension 10/23/2019 No Yes Inactive Problems Resolved Problems Electronic Signature(s) Signed: 10/30/2019 1:19:31 PM By: Worthy Keeler PA-C Entered By: Worthy Keeler on 10/30/2019 13:19:30 [name] ([id number]) -------------------------------------------------------------------------------- Progress Note Details Patient Name: [name]. Date of Service: 10/30/2019 12:45 PM Medical Record Number: [id number] Patient Account Number: [id number] Date of Birth/Sex: 02-09-1932 (85 y.o. M) Treating RN: Army Melia Primary Care Provider: Derinda Late Other Clinician: Referring Provider: BABAOFF, MARCUS Treating Provider/Extender: Joaquim Lai  III, Emilea Goga Weeks in Treatment: 1 Subjective Chief Complaint Information obtained from Patient Right forearm skin tear History of Present Illness (HPI) 10/23/2019 on evaluation today patient appears to be doing poorly in regard to the right forearm skin tear which occurred yesterday when he was playing golf. He tells me that he was going down a hill and unfortunately slipped it was an area he was not supposed to be in. He was going to just play one whole after doing some practice shots. Fortunately he did not have any severe injuries but unfortunately he does have a skin tear that obviously is needing to be managed at this point. He does have hypertension which is fairly well controlled. He also has some discomfort but he tells me nothing to tremendous at this point. Fortunately there is no signs of active infection at this time. The skin unfortunately somewhat peeled back they attempted to pull this back over but have not been able to get it to fully cover  the wound. I think that we will be able to once we completely unfold the skin and I explained to him we would have to roll this back and completely release it from the bed of the wound in order to get it to cover the area as a whole. He is okay with that. 10/30/2019 upon evaluation today patient actually appears to be doing quite well with regard to his forearm ulcer. He has been tolerating the dressing changes without complication. Fortunately there is no signs of active infection at this time. No fevers, chills, nausea, vomiting, or diarrhea. Overall a lot of the tissue did reattach to the base of the wound although he does have some superficial epidermal tissue that is sloughing off underneath this has reattached quite nicely and seems to have healed nonetheless. There is still some open wound areas. His dressing was quite stuck likely due to bleeding. Patient History Information obtained from Patient. Family History Cancer - Father, Kidney Disease - Mother, No family history of Diabetes, Heart Disease, Hereditary Spherocytosis, Hypertension, Lung Disease, Seizures, Stroke, Thyroid Problems, Tuberculosis. Social History Never smoker, Marital Status - Married, Alcohol Use - Daily, Drug Use - No History, Caffeine Use - Never. Medical History Eyes Denies history of Cataracts, Glaucoma, Optic Neuritis Hematologic/Lymphatic Patient has history of Anemia Denies history of Hemophilia, Human Immunodeficiency Virus, Lymphedema, Sickle Cell Disease Cardiovascular Patient has history of Hypertension Denies history of Angina, Arrhythmia, Congestive Heart Failure, Coronary Artery Disease, Deep Vein Thrombosis, Hypotension, Myocardial Infarction, Peripheral Arterial Disease, Peripheral Venous Disease, Phlebitis, Vasculitis Integumentary (Skin) Denies history of History of Burn, History of pressure wounds Oncologic AXIEL, [place] ([id number]) Denies history of Received Chemotherapy, Received  Radiation Medical And Surgical History Notes Eyes retinal detachment - 1994; 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 Respiratory Denies complaints or symptoms of Chronic or frequent coughs, Shortness of Breath. Cardiovascular Denies complaints or symptoms of Chest pain, LE edema. Psychiatric Denies complaints or symptoms of Anxiety, Claustrophobia. Objective Constitutional Well-nourished and well-hydrated in no acute distress. Vitals Time Taken: 12:55 PM, Height: 68 in, Weight: 173 lbs, BMI: 26.3, Temperature: 98.8 F, Pulse: 66 bpm, Respiratory Rate: 16 breaths/min, Blood Pressure: 127/69 mmHg. Respiratory normal breathing without difficulty. clear to auscultation bilaterally. Cardiovascular regular rate and rhythm with normal S1, S2. Psychiatric this patient is able to make decisions and  demonstrates good insight into disease process. Alert and Oriented x 3. pleasant and cooperative. General Notes: His wound bed currently showed signs of good granulation at this time and epithelization as well. Improvement with regard. Fortunately there is no evidence of active infection at this time which is good news. No fevers, chills, nausea, vomiting, or diarrhea. Integumentary (Hair, Skin) Wound #1 status is Open. Original cause of wound was Trauma. The wound is located on the Right Forearm. The wound measures 5cm length x 2.4cm width x 0.1cm depth; 9.425cm^2 area and 0.942cm^3 volume. There is Fat Layer (Subcutaneous Tissue) Exposed exposed. There is no tunneling or undermining noted. There is a medium amount of sanguinous drainage noted. The wound margin is flat and intact. There is medium (34-66%) red granulation within the wound bed. There  is a medium (34-66%) amount of necrotic tissue within the wound bed including Adherent Slough. ROGERICK, SCHWENT ([id number]) Assessment Active Problems ICD-10 Unspecified open wound of right forearm, initial encounter Essential (primary) hypertension Procedures Wound #1 Pre-procedure diagnosis of Wound #1 is a Skin Tear located on the Right Forearm . There was a Chemical/Enzymatic/Mechanical debridement performed by STONE III, Donnie Gedeon E., PA-C. With the following instrument(s): saline and gauze after achieving pain control using Lidocaine. Other agent used was saline and gauze. A time out was conducted at 13:33, prior to the start of the procedure. There was no bleeding. The procedure was tolerated well. Post Debridement Measurements: 5cm length x 2.4cm width x 0.1cm depth; 0.942cm^3 volume. Character of Wound/Ulcer Post Debridement is stable. Post procedure Diagnosis Wound #1: Same as Pre-Procedure Plan Wound Cleansing: Wound #1 Right Forearm: Clean wound with Normal Saline. Anesthetic (add to Medication List): Wound #1 Right Forearm: Topical Lidocaine 4% cream applied to wound bed prior to debridement (In Clinic Only). Skin Barriers/Peri-Wound Care: Wound #1 Right Forearm: Skin Prep Primary Wound Dressing: Wound #1 Right Forearm: Xeroform Secondary Dressing: Wound #1 Right Forearm: Gauze and Kerlix/Conform Dressing Change Frequency: Wound #1 Right Forearm: Change dressing every other day. Follow-up Appointments: Wound #1 Right Forearm: Return Appointment in 1 week. NESTA, CUDE ([id number]) I recommend at this point that we go ahead and continue with the Xeroform gauze dressing no additional Steri-Strips were necessary today and no debridement was needed today either. Overall I feel like he is healing quite nicely. 2. I would recommend as well that he continue to cover this area with an ABD pad or gauze and then subsequently Kerlix in order to hold this in place.  Hopefully that we will continue to keep things under good control and allow this to continue to heal I think the Xeroform will keep everything from sticking. We will see patient back for reevaluation in 1 week here in the clinic. If anything worsens or changes patient will contact our office for additional recommendations. Electronic Signature(s) Signed: 11/03/2019 10:12:51 AM By: Worthy Keeler PA-C Previous Signature: 10/30/2019 2:01:20 PM Version By: Worthy Keeler PA-C Entered By: Worthy Keeler on 11/03/2019 10:12:51 DIONISIOS, THIEU ([id number]) -------------------------------------------------------------------------------- ROS/PFSH Details Patient Name: [name] Date of Service: 10/30/2019 12:45 PM Medical Record Number: [id number] Patient Account Number: [id number] Date of Birth/Sex: August 10, 1932 (85 y.o. M) Treating RN: Army Melia Primary Care Provider: Derinda Late Other Clinician: Referring Provider: BABAOFF, MARCUS Treating Provider/Extender: STONE III, Regena Delucchi Weeks in Treatment: 1 Information Obtained From Patient Constitutional Symptoms (General Health) Complaints and Symptoms: Negative for: Fatigue; Fever; Chills; Marked Weight Change Respiratory Complaints and Symptoms: Negative for: Chronic or frequent coughs;  Shortness of Breath Cardiovascular Complaints and Symptoms: Negative for: Chest pain; LE edema Medical History: Positive for: Hypertension Negative for: Angina; Arrhythmia; Congestive Heart Failure; Coronary Artery Disease; Deep Vein Thrombosis; Hypotension; Myocardial Infarction; Peripheral Arterial Disease; Peripheral Venous Disease; Phlebitis; Vasculitis Psychiatric Complaints and Symptoms: Negative for: Anxiety; Claustrophobia Eyes Medical History: Negative for: Cataracts; Glaucoma; Optic Neuritis Past Medical History Notes: retinal detachment - 1994; prosthetic left eye Hematologic/Lymphatic Medical History: Positive for:  Anemia Negative for: Hemophilia; Human Immunodeficiency Virus; Lymphedema; Sickle Cell Disease Past Medical History Notes: idiopathic thrombocytopenic purpura; autoimmune hemolytic anemia Integumentary (Skin) Medical History: Negative for: History of Burn; History of pressure wounds Oncologic SANDY, TORIVIO ([id number]) Medical History: Negative for: Received Chemotherapy; Received Radiation Past Medical History Notes: prostate cancer - TURP 2016 Immunizations Pneumococcal Vaccine: Received Pneumococcal Vaccination: Yes Implantable Devices None Family and Social History Cancer: Yes - Father; Diabetes: No; Heart Disease: No; Hereditary Spherocytosis: No; Hypertension: No; Kidney Disease: Yes - Mother; Lung Disease: No; Seizures: No; Stroke: No; Thyroid Problems: No; Tuberculosis: No; Never smoker; Marital Status - Married; Alcohol Use: Daily; Drug Use: No History; Caffeine Use: Never; Financial Concerns: No; Food, Clothing or Shelter Needs: No; Support System Lacking: No; Transportation Concerns: No Physician Affirmation I have reviewed and agree with the above information. Electronic Signature(s) Signed: 10/30/2019 4:29:54 PM By: Army Melia Signed: 10/30/2019 6:18:38 PM By: Worthy Keeler PA-C Entered By: Worthy Keeler on 10/30/2019 14:00:28 PAITYN, PHIMMASONE ([id number]) -------------------------------------------------------------------------------- SuperBill Details Patient Name: [name] Date of Service: 10/30/2019 Medical Record Number: [id number] Patient Account Number: [id number] Date of Birth/Sex: 06/08/1932 (85 y.o. M) Treating RN: Army Melia Primary Care Provider: Derinda Late Other Clinician: Referring Provider: BABAOFF, MARCUS Treating Provider/Extender: STONE III, Carnita Golob Weeks in Treatment: 1 Diagnosis Coding ICD-10 Codes Code Description [id number] Unspecified open wound of right forearm, initial encounter [place] (primary)  hypertension Facility Procedures CPT4 Code: [id number] Description: [place] VISIT-LEV 3 EST PT Modifier: Quantity: 1 CPT4 Code: [id number] Description: [id number] - [place] W/O ANES NON SELECT Modifier: Quantity: 1 Physician Procedures CPT4 Code: [id number] Description: [id number] - WC PHYS LEVEL 4 - EST PT ICD-10 Diagnosis Description [id number] Unspecified open wound of right forearm, initial encounte I10 Essential (primary) hypertension Modifier: r Quantity: 1 Electronic Signature(s) Signed: 10/30/2019 2:01:39 PM By: Worthy Keeler PA-C Entered By: Worthy Keeler on 10/30/2019 14:01:39

## 2019-10-30 NOTE — Progress Notes (Signed)
RILO, TRAGER ([id number]) Visit Report for 10/30/2019 Arrival Information Details Patient Bennett: [name], [name]. Date of Service: 10/30/2019 12:45 PM Medical Record Number: [id number] Patient Account Number: [id number] Date of Birth/Sex: 1932/09/15 (85 y.o. M) Treating RN: Army Melia Primary Care Lochlann Mastrangelo: BABAOFF, MARCUS Other Clinician: Referring Faigy Stretch: BABAOFF, MARCUS Treating Breelynn Bankert/Extender: STONE III, HOYT Weeks in Treatment: 1 Visit Information History Since Last Visit Added or deleted any medications: No Patient Arrived: Ambulatory Any new allergies or adverse reactions: No Arrival Time: 12:53 Had a fall or experienced change in No Accompanied By: wife activities of daily living that may affect Transfer Assistance: None risk of falls: Patient Identification Verified: Yes Signs or symptoms of abuse/neglect since last visito No Secondary Verification Process Completed: Yes Hospitalized since last visit: No Implantable device outside of the clinic excluding No cellular tissue based products placed in the center since last visit: Has Dressing in Place as Prescribed: Yes Pain Present Now: Yes Electronic Signature(s) Signed: 10/30/2019 3:22:29 PM By: Lorine Bears RCP, RRT, CHT Entered By: Lorine Bears on 10/30/2019 12:54:07 [name] ([id number]) -------------------------------------------------------------------------------- Clinic Level of Care Assessment Details Patient Bennett: [name], Keith C. Date of Service: 10/30/2019 12:45 PM Medical Record Number: [id number] Patient Account Number: [id number] Date of Birth/Sex: [birth year] (85 y.o. M) Treating RN: Army Melia Primary Care Thao Bauza: BABAOFF, MARCUS Other Clinician: Referring Norma Montemurro: BABAOFF, MARCUS Treating Eva Griffo/Extender: STONE III, HOYT Weeks in Treatment: 1 Clinic Level of Care Assessment Items TOOL 4 Quantity Score []  - Use when only an EandM is  performed on FOLLOW-UP visit 0 ASSESSMENTS - Nursing Assessment / Reassessment X - Reassessment of Co-morbidities (includes updates in patient status) 1 10 X- 1 5 Reassessment of Adherence to Treatment Plan ASSESSMENTS - Wound and Skin Assessment / Reassessment X - Simple Wound Assessment / Reassessment - one wound 1 5 []  - 0 Complex Wound Assessment / Reassessment - multiple wounds []  - 0 Dermatologic / Skin Assessment (not related to wound area) ASSESSMENTS - Focused Assessment []  - Circumferential Edema Measurements - multi extremities 0 []  - 0 Nutritional Assessment / Counseling / Intervention []  - 0 Lower Extremity Assessment (monofilament, tuning fork, pulses) []  - 0 Peripheral Arterial Disease Assessment (using hand held doppler) ASSESSMENTS - Ostomy and/or Continence Assessment and Care []  - Incontinence Assessment and Management 0 []  - 0 Ostomy Care Assessment and Management (repouching, etc.) PROCESS - Coordination of Care X - Simple Patient / Family Education for ongoing care 1 15 []  - 0 Complex (extensive) Patient / Family Education for ongoing care X- 1 10 Staff obtains Programmer, systems, Records, Test Results / Process Orders []  - 0 Staff telephones HHA, Nursing Homes / Clarify orders / etc []  - 0 Routine Transfer to another Facility (non-emergent condition) []  - 0 Routine Hospital Admission (non-emergent condition) []  - 0 New Admissions / Biomedical engineer / Ordering NPWT, Apligraf, etc. []  - 0 Emergency Hospital Admission (emergent condition) X- 1 10 Simple Discharge Coordination [name], NORQUIST. ([id number]) []  - 0 Complex (extensive) Discharge Coordination PROCESS - Special Needs []  - Pediatric / Minor Patient Management 0 []  - 0 Isolation Patient Management []  - 0 Hearing / Language / Visual special needs []  - 0 Assessment of Community assistance (transportation, D/C planning, etc.) []  - 0 Additional assistance / Altered mentation []  -  0 Support Surface(s) Assessment (bed, cushion, seat, etc.) INTERVENTIONS - Wound Cleansing / Measurement X - Simple Wound Cleansing - one wound 1 5 []  - 0 Complex Wound Cleansing - multiple wounds []  -  0 Wound Imaging (photographs - any number of wounds) []  - 0 Wound Tracing (instead of photographs) []  - 0 Simple Wound Measurement - one wound []  - 0 Complex Wound Measurement - multiple wounds INTERVENTIONS - Wound Dressings []  - Small Wound Dressing one or multiple wounds 0 X- 1 15 Medium Wound Dressing one or multiple wounds []  - 0 Large Wound Dressing one or multiple wounds []  - 0 Application of Medications - topical []  - 0 Application of Medications - injection INTERVENTIONS - Miscellaneous []  - External ear exam 0 []  - 0 Specimen Collection (cultures, biopsies, blood, body fluids, etc.) []  - 0 Specimen(s) / Culture(s) sent or taken to Lab for analysis []  - 0 Patient Transfer (multiple staff / Civil Service fast streamer / Similar devices) []  - 0 Simple Staple / Suture removal (25 or less) []  - 0 Complex Staple / Suture removal (26 or more) []  - 0 Hypo / Hyperglycemic Management (close monitor of Blood Glucose) []  - 0 Ankle / Brachial Index (ABI) - do not check if billed separately X- 1 5 Vital Signs [name], Keith C. ([id number]) Has the patient been seen at the hospital within the last three years: Yes Total Score: 80 Level Of Care: New/Established - Level 3 Electronic Signature(s) Signed: 10/30/2019 4:29:54 PM By: Army Melia Entered By: Army Melia on 10/30/2019 13:32:33 [name] ([id number]) -------------------------------------------------------------------------------- Encounter Discharge Information Details Patient Bennett: [name]. Date of Service: 10/30/2019 12:45 PM Medical Record Number: [id number] Patient Account Number: [id number] Date of Birth/Sex: 1932/07/30 (85 y.o. M) Treating RN: Army Melia Primary Care Charisse Wendell: Derinda Late Other  Clinician: Referring Bently Morath: BABAOFF, MARCUS Treating Tyrann Donaho/Extender: Melburn Hake, HOYT Weeks in Treatment: 1 Encounter Discharge Information Items Discharge Condition: Stable Ambulatory Status: Ambulatory Discharge Destination: Home Transportation: Private Auto Accompanied By: wife Schedule Follow-up Appointment: Yes Clinical Summary of Care: Electronic Signature(s) Signed: 10/30/2019 4:29:54 PM By: Army Melia Entered By: Army Melia on 10/30/2019 13:34:09 [name] ([id number]) -------------------------------------------------------------------------------- Lower Extremity Assessment Details Patient Bennett: Keith Gutting C. Date of Service: 10/30/2019 12:45 PM Medical Record Number: [id number] Patient Account Number: [id number] Date of Birth/Sex: 11-16-1932 (85 y.o. M) Treating RN: Montey Hora Primary Care Carine Nordgren: Derinda Late Other Clinician: Referring Johana Hopkinson: BABAOFF, MARCUS Treating Vernisha Bacote/Extender: Melburn Hake, HOYT Weeks in Treatment: 1 Electronic Signature(s) Signed: 10/30/2019 3:25:11 PM By: Montey Hora Entered By: Montey Hora on 10/30/2019 12:58:37 [name] ([id number]) -------------------------------------------------------------------------------- Multi Wound Chart Details Patient Bennett: Keith Gutting C. Date of Service: 10/30/2019 12:45 PM Medical Record Number: [id number] Patient Account Number: [id number] Date of Birth/Sex: October 11, 1932 (85 y.o. M) Treating RN: Army Melia Primary Care Dhalia Zingaro: BABAOFF, MARCUS Other Clinician: Referring Grazia Taffe: BABAOFF, MARCUS Treating Priyal Musquiz/Extender: STONE III, HOYT Weeks in Treatment: 1 Vital Signs Height(in): 68 Pulse(bpm): 66 Weight(lbs): 173 Blood Pressure(mmHg): 127/69 Body Mass Index(BMI): 26 Temperature(F): 98.8 Respiratory Rate 16 (breaths/min): Photos: [N/A:N/A] Wound Location: Right Forearm N/A N/A Wounding Event: Trauma N/A N/A Primary Etiology: Skin Tear N/A  N/A Comorbid History: Anemia, Hypertension N/A N/A Date Acquired: 10/22/2019 N/A N/A Weeks of Treatment: 1 N/A N/A Wound Status: Open N/A N/A Measurements L x W x D 5x2.4x0.1 N/A N/A (cm) Area (cm) : 9.425 N/A N/A Volume (cm) : 0.942 N/A N/A % Reduction in Area: 7.70% N/A N/A % Reduction in Volume: 7.70% N/A N/A Classification: Full Thickness Without N/A N/A Exposed Support Structures Exudate Amount: Medium N/A N/A Exudate Type: Sanguinous N/A N/A Exudate Color: red N/A N/A Wound Margin: Flat and Intact N/A N/A Granulation Amount: Medium (34-66%)  N/A N/A Granulation Quality: Red N/A N/A Necrotic Amount: Medium (34-66%) N/A N/A Exposed Structures: Fat Layer (Subcutaneous N/A N/A Tissue) Exposed: Yes Fascia: No Tendon: No Muscle: No Joint: No Bone: No Manganaro, Keith C. ([id number]) Epithelialization: None N/A N/A Treatment Notes Electronic Signature(s) Signed: 10/30/2019 4:29:54 PM By: Army Melia Entered By: Army Melia on 10/30/2019 13:26:03 [name] ([id number]) -------------------------------------------------------------------------------- [place] Details Patient Bennett: [name]. Date of Service: 10/30/2019 12:45 PM Medical Record Number: [id number] Patient Account Number: [id number] Date of Birth/Sex: 1932-01-29 (85 y.o. M) Treating RN: Army Melia Primary Care Macaiah Mangal: Derinda Late Other Clinician: Referring Lilliah Priego: BABAOFF, MARCUS Treating Green Quincy/Extender: STONE III, HOYT Weeks in Treatment: 1 Active Inactive Abuse / Safety / Falls / Self Care Management Nursing Diagnoses: History of Falls Potential for falls Goals: Patient will not experience any injury related to falls Date Initiated: 10/23/2019 Target Resolution Date: 10/23/2019 Goal Status: Active Interventions: Assess fall risk on admission and as needed Notes: Orientation to the Wound Care Program Nursing Diagnoses: Knowledge deficit related to the  wound healing center program Goals: Patient/caregiver will verbalize understanding of the [facility] Program Date Initiated: 10/23/2019 Target Resolution Date: 10/23/2019 Goal Status: Active Interventions: Provide education on orientation to the wound center Notes: Wound/Skin Impairment Nursing Diagnoses: Impaired tissue integrity Goals: Patient/caregiver will verbalize understanding of skin care regimen Date Initiated: 10/23/2019 Target Resolution Date: 10/22/2019 Goal Status: Active Ulcer/skin breakdown will have a volume reduction of 30% by week 4 Date Initiated: 10/23/2019 Target Resolution Date: 11/23/2019 [name], [name] ([id number]) Goal Status: Active Interventions: Assess ulceration(s) every visit Treatment Activities: Refer to smoking cessation program : 10/23/2019 Notes: Electronic Signature(s) Signed: 10/30/2019 4:29:54 PM By: Army Melia Entered By: Army Melia on 10/30/2019 13:25:56 [name] ([id number]) -------------------------------------------------------------------------------- Pain Assessment Details Patient Bennett: [name]. Date of Service: 10/30/2019 12:45 PM Medical Record Number: [id number] Patient Account Number: [id number] Date of Birth/Sex: July 15, 1932 (85 y.o. M) Treating RN: Army Melia Primary Care Jamira Barfuss: Derinda Late Other Clinician: Referring Lorrain Rivers: BABAOFF, MARCUS Treating Keonta Alsip/Extender: STONE III, HOYT Weeks in Treatment: 1 Active Problems Location of Pain Severity and Description of Pain Patient Has Paino Yes Site Locations Rate the pain. Current Pain Level: 7 Pain Management and Medication Current Pain Management: Electronic Signature(s) Signed: 10/30/2019 3:22:29 PM By: Lorine Bears RCP, RRT, CHT Signed: 10/30/2019 4:29:54 PM By: Army Melia Entered By: Lorine Bears on 10/30/2019 12:54:37 [name], [name]  ([id number]) -------------------------------------------------------------------------------- Patient/Caregiver Education Details Patient Bennett: [name] Date of Service: 10/30/2019 12:45 PM Medical Record Number: [id number] Patient Account Number: [id number] Date of Birth/Gender: 1932/09/21 (85 y.o. M) Treating RN: Army Melia Primary Care Physician: Derinda Late Other Clinician: Referring Physician: BABAOFF, MARCUS Treating Physician/Extender: Melburn Hake, HOYT Weeks in Treatment: 1 Education Assessment Education Provided To: Patient Education Topics Provided Wound/Skin Impairment: Handouts: Caring for Your Ulcer Methods: Demonstration, Explain/Verbal Responses: State content correctly Electronic Signature(s) Signed: 10/30/2019 4:29:54 PM By: Army Melia Entered By: Army Melia on 10/30/2019 13:33:06 [name] ([id number]) -------------------------------------------------------------------------------- Wound Assessment Details Patient Bennett: Keith Gutting C. Date of Service: 10/30/2019 12:45 PM Medical Record Number: [id number] Patient Account Number: [id number] Date of Birth/Sex: 1932-07-10 (85 y.o. M) Treating RN: Montey Hora Primary Care Crayton Savarese: Derinda Late Other Clinician: Referring Jaimy Kliethermes: BABAOFF, MARCUS Treating Jasa Dundon/Extender: STONE III, HOYT Weeks in Treatment: 1 Wound Status Wound Number: 1 Primary Etiology: Skin Tear Wound Location: Right Forearm Wound Status: Open Wounding Event: Trauma Comorbid History: Anemia, Hypertension Date Acquired: 10/22/2019 Weeks Of Treatment:  1 Clustered Wound: No Photos Wound Measurements Length: (cm) 5 % Reduct Width: (cm) 2.4 % Reduct Depth: (cm) 0.1 Epitheli Area: (cm) 9.425 Tunneli Volume: (cm) 0.942 Undermi ion in Area: 7.7% ion in Volume: 7.7% alization: None ng: No ning: No Wound Description Full Thickness Without Exposed Support Foul Od Classification: Structures  Slough/ Wound Margin: Flat and Intact Exudate Medium Amount: Exudate Type: Sanguinous Exudate Color: red or After Cleansing: No Fibrino No Wound Bed Granulation Amount: Medium (34-66%) Exposed Structure Granulation Quality: Red Fascia Exposed: No Necrotic Amount: Medium (34-66%) Fat Layer (Subcutaneous Tissue) Exposed: Yes Necrotic Quality: Adherent Slough Tendon Exposed: No Muscle Exposed: No Joint Exposed: No Bone Exposed: No [name], Keith C. ([id number]) Treatment Notes Wound #1 (Right Forearm) Notes xeroform, telfa pad, conform, stetch net Electronic Signature(s) Signed: 10/30/2019 3:25:11 PM By: Montey Hora Entered By: Montey Hora on 10/30/2019 13:17:42 [name], [name] ([id number]) -------------------------------------------------------------------------------- [name]: [name]. Date of Service: 10/30/2019 12:45 PM Medical Record Number: [id number] Patient Account Number: [id number] Date of Birth/Sex: [birth year] (85 y.o. M) Treating RN: Army Melia Primary Care Daviel Allegretto: BABAOFF, MARCUS Other Clinician: Referring Wyonia Fontanella: BABAOFF, MARCUS Treating Jayvian Escoe/Extender: STONE III, HOYT Weeks in Treatment: 1 Vital Signs Time Taken: 12:55 Temperature (F): 98.8 Height (in): 68 Pulse (bpm): 66 Weight (lbs): 173 Respiratory Rate (breaths/min): 16 Body Mass Index (BMI): 26.3 Blood Pressure (mmHg): 127/69 Reference Range: 80 - 120 mg / dl Electronic Signature(s) Signed: 10/30/2019 3:22:29 PM By: Lorine Bears RCP, RRT, CHT Entered By: Lorine Bears on 10/30/2019 12:57:41

## 2019-11-06 ENCOUNTER — Encounter: Payer: PPO | Admitting: Physician Assistant

## 2019-11-06 ENCOUNTER — Other Ambulatory Visit: Payer: Self-pay

## 2019-11-06 DIAGNOSIS — L98492 Non-pressure chronic ulcer of skin of other sites with fat layer exposed: Secondary | ICD-10-CM | POA: Diagnosis not present

## 2019-11-06 DIAGNOSIS — L98499 Non-pressure chronic ulcer of skin of other sites with unspecified severity: Secondary | ICD-10-CM | POA: Diagnosis not present

## 2019-11-06 MED FILL — PROMACTA 25 MG TABLET: 25 | 30 days supply | Qty: 15 | Fill #3

## 2019-11-06 NOTE — Progress Notes (Signed)
JAMESMATTHEW, RAPPOLD ([id number]) Visit Report for 11/06/2019 Arrival Information Details Patient Name: [name], [name]. Date of Service: 11/06/2019 2:30 PM Medical Record Number: [id number] Patient Account Number: [id number] Date of Birth/Sex: [birth year] (85 y.o. M) Treating RN: Harold Barban Primary Care Farhana Fellows: BABAOFF, MARCUS Other Clinician: Referring Saman Umstead: BABAOFF, MARCUS Treating Helyne Genther/Extender: STONE III, HOYT Weeks in Treatment: 2 Visit Information History Since Last Visit Added or deleted any medications: No Patient Arrived: Ambulatory Any new allergies or adverse reactions: No Arrival Time: 14:29 Had a fall or experienced change in No Accompanied By: wife activities of daily living that may affect Transfer Assistance: None risk of falls: Patient Identification Verified: Yes Signs or symptoms of abuse/neglect since last visito No Secondary Verification Process Completed: Yes Hospitalized since last visit: No Has Dressing in Place as Prescribed: Yes Pain Present Now: No Electronic Signature(s) Signed: 11/06/2019 4:33:53 PM By: Harold Barban Entered By: Harold Barban on 11/06/2019 14:29:43 [name] ([id number]) -------------------------------------------------------------------------------- Clinic Level of Care Assessment Details Patient Name: [name]. Date of Service: 11/06/2019 2:30 PM Medical Record Number: [id number] Patient Account Number: [id number] Date of Birth/Sex: 1932/09/13 (85 y.o. M) Treating RN: Army Melia Primary Care Kalyse Meharg: BABAOFF, MARCUS Other Clinician: Referring Ecko Beasley: BABAOFF, MARCUS Treating Makyna Niehoff/Extender: STONE III, HOYT Weeks in Treatment: 2 Clinic Level of Care Assessment Items TOOL 4 Quantity Score []  - Use when only an EandM is performed on FOLLOW-UP visit 0 ASSESSMENTS - Nursing Assessment / Reassessment X - Reassessment of Co-morbidities (includes updates in patient status) 1 10 X- 1  5 Reassessment of Adherence to Treatment Plan ASSESSMENTS - Wound and Skin Assessment / Reassessment X - Simple Wound Assessment / Reassessment - one wound 1 5 []  - 0 Complex Wound Assessment / Reassessment - multiple wounds []  - 0 Dermatologic / Skin Assessment (not related to wound area) ASSESSMENTS - Focused Assessment []  - Circumferential Edema Measurements - multi extremities 0 []  - 0 Nutritional Assessment / Counseling / Intervention []  - 0 Lower Extremity Assessment (monofilament, tuning fork, pulses) []  - 0 Peripheral Arterial Disease Assessment (using hand held doppler) ASSESSMENTS - Ostomy and/or Continence Assessment and Care []  - Incontinence Assessment and Management 0 []  - 0 Ostomy Care Assessment and Management (repouching, etc.) PROCESS - Coordination of Care X - Simple Patient / Family Education for ongoing care 1 15 []  - 0 Complex (extensive) Patient / Family Education for ongoing care []  - 0 Staff obtains Programmer, systems, Records, Test Results / Process Orders []  - 0 Staff telephones HHA, Nursing Homes / Clarify orders / etc []  - 0 Routine Transfer to another Facility (non-emergent condition) []  - 0 Routine Hospital Admission (non-emergent condition) []  - 0 New Admissions / Biomedical engineer / Ordering NPWT, Apligraf, etc. []  - 0 Emergency Hospital Admission (emergent condition) X- 1 10 Simple Discharge Coordination [name], [name] ([id number]) []  - 0 Complex (extensive) Discharge Coordination PROCESS - Special Needs []  - Pediatric / Minor Patient Management 0 []  - 0 Isolation Patient Management []  - 0 Hearing / Language / Visual special needs []  - 0 Assessment of Community assistance (transportation, D/C planning, etc.) []  - 0 Additional assistance / Altered mentation []  - 0 Support Surface(s) Assessment (bed, cushion, seat, etc.) INTERVENTIONS - Wound Cleansing / Measurement X - Simple Wound Cleansing - one wound 1 5 []  - 0 Complex Wound  Cleansing - multiple wounds X- 1 5 Wound Imaging (photographs - any number of wounds) []  - 0 Wound Tracing (instead of photographs) X- 1 5 Simple Wound  Measurement - one wound []  - 0 Complex Wound Measurement - multiple wounds INTERVENTIONS - Wound Dressings []  - Small Wound Dressing one or multiple wounds 0 X- 1 15 Medium Wound Dressing one or multiple wounds []  - 0 Large Wound Dressing one or multiple wounds []  - 0 Application of Medications - topical []  - 0 Application of Medications - injection INTERVENTIONS - Miscellaneous []  - External ear exam 0 []  - 0 Specimen Collection (cultures, biopsies, blood, body fluids, etc.) []  - 0 Specimen(s) / Culture(s) sent or taken to Lab for analysis []  - 0 Patient Transfer (multiple staff / Civil Service fast streamer / Similar devices) []  - 0 Simple Staple / Suture removal (25 or less) []  - 0 Complex Staple / Suture removal (26 or more) []  - 0 Hypo / Hyperglycemic Management (close monitor of Blood Glucose) []  - 0 Ankle / Brachial Index (ABI) - do not check if billed separately X- 1 5 Vital Signs [name], Keith C. ([id number]) Has the patient been seen at the hospital within the last three years: Yes Total Score: 80 Level Of Care: New/Established - Level 3 Electronic Signature(s) Signed: 11/06/2019 4:33:56 PM By: Army Melia Entered By: Army Melia on 11/06/2019 15:26:41 [name] ([id number]) -------------------------------------------------------------------------------- Encounter Discharge Information Details Patient Name: [name]. Date of Service: 11/06/2019 2:30 PM Medical Record Number: [id number] Patient Account Number: [id number] Date of Birth/Sex: Nov 05, 1932 (85 y.o. M) Treating RN: Army Melia Primary Care Aadan Chenier: Derinda Late Other Clinician: Referring Vanity Larsson: BABAOFF, MARCUS Treating Abagayle Klutts/Extender: Melburn Hake, HOYT Weeks in Treatment: 2 Encounter Discharge Information Items Discharge Condition:  Stable Ambulatory Status: Ambulatory Discharge Destination: Home Transportation: Private Auto Accompanied By: spouse Schedule Follow-up Appointment: Yes Clinical Summary of Care: Electronic Signature(s) Signed: 11/06/2019 4:33:56 PM By: Army Melia Entered By: Army Melia on 11/06/2019 15:27:30 [name] ([id number]) -------------------------------------------------------------------------------- Lower Extremity Assessment Details Patient Name: Keith Gutting C. Date of Service: 11/06/2019 2:30 PM Medical Record Number: [id number] Patient Account Number: [id number] Date of Birth/Sex: 01/13/1932 (85 y.o. M) Treating RN: Harold Barban Primary Care Vearl Aitken: Derinda Late Other Clinician: Referring Nilo Fallin: BABAOFF, MARCUS Treating Lailoni Baquera/Extender: Melburn Hake, HOYT Weeks in Treatment: 2 Electronic Signature(s) Signed: 11/06/2019 4:33:53 PM By: Harold Barban Entered By: Harold Barban on 11/06/2019 14:32:53 [name], [name] ([id number]) -------------------------------------------------------------------------------- Multi Wound Chart Details Patient Name: Keith Gutting C. Date of Service: 11/06/2019 2:30 PM Medical Record Number: [id number] Patient Account Number: [id number] Date of Birth/Sex: 1931/11/25 (85 y.o. M) Treating RN: Army Melia Primary Care Raijon Lindfors: BABAOFF, MARCUS Other Clinician: Referring Gerhardt Gleed: BABAOFF, MARCUS Treating Daron Stutz/Extender: STONE III, HOYT Weeks in Treatment: 2 Vital Signs Height(in): 68 Pulse(bpm): 66 Weight(lbs): 173 Blood Pressure(mmHg): 142/70 Body Mass Index(BMI): 26 Temperature(F): 98.0 Respiratory Rate 16 (breaths/min): Photos: [N/A:N/A] Wound Location: Right Forearm N/A N/A Wounding Event: Trauma N/A N/A Primary Etiology: Skin Tear N/A N/A Comorbid History: Anemia, Hypertension N/A N/A Date Acquired: 10/22/2019 N/A N/A Weeks of Treatment: 2 N/A N/A Wound Status: Open N/A N/A Measurements L x W x D  4x1x0.1 N/A N/A (cm) Area (cm) : 3.142 N/A N/A Volume (cm) : 0.314 N/A N/A % Reduction in Area: 69.20% N/A N/A % Reduction in Volume: 69.20% N/A N/A Classification: Full Thickness Without N/A N/A Exposed Support Structures Exudate Amount: Medium N/A N/A Exudate Type: Sanguinous N/A N/A Exudate Color: red N/A N/A Wound Margin: Flat and Intact N/A N/A Granulation Amount: Medium (34-66%) N/A N/A Granulation Quality: Red N/A N/A Necrotic Amount: Medium (34-66%) N/A N/A Exposed Structures: Fat Layer (Subcutaneous N/A N/A Tissue) Exposed:  Yes Fascia: No Tendon: No Muscle: No Joint: No Bone: No [name], Keith C. ([id number]) Epithelialization: None N/A N/A Treatment Notes Electronic Signature(s) Signed: 11/06/2019 4:33:56 PM By: Army Melia Entered By: Army Melia on 11/06/2019 15:24:47 [name] ([id number]) -------------------------------------------------------------------------------- Multi-Disciplinary Care Plan Details Patient Name: [name], RIENTS. Date of Service: 11/06/2019 2:30 PM Medical Record Number: [id number] Patient Account Number: [id number] Date of Birth/Sex: Apr 01, 1932 (85 y.o. M) Treating RN: Army Melia Primary Care Marjan Rosman: Derinda Late Other Clinician: Referring Amrita Radu: BABAOFF, MARCUS Treating Arhaan Chesnut/Extender: STONE III, HOYT Weeks in Treatment: 2 Active Inactive Abuse / Safety / Falls / Self Care Management Nursing Diagnoses: History of Falls Potential for falls Goals: Patient will not experience any injury related to falls Date Initiated: 10/23/2019 Target Resolution Date: 10/23/2019 Goal Status: Active Interventions: Assess fall risk on admission and as needed Notes: Orientation to the Wound Care Program Nursing Diagnoses: Knowledge deficit related to the wound healing center program Goals: Patient/caregiver will verbalize understanding of the [facility] Program Date Initiated: 10/23/2019 Target Resolution  Date: 10/23/2019 Goal Status: Active Interventions: Provide education on orientation to the wound center Notes: Wound/Skin Impairment Nursing Diagnoses: Impaired tissue integrity Goals: Patient/caregiver will verbalize understanding of skin care regimen Date Initiated: 10/23/2019 Target Resolution Date: 10/22/2019 Goal Status: Active Ulcer/skin breakdown will have a volume reduction of 30% by week 4 Date Initiated: 10/23/2019 Target Resolution Date: 11/23/2019 [name], [name] ([id number]) Goal Status: Active Interventions: Assess ulceration(s) every visit Treatment Activities: Refer to smoking cessation program : 10/23/2019 Notes: Electronic Signature(s) Signed: 11/06/2019 4:33:56 PM By: Army Melia Entered By: Army Melia on 11/06/2019 16:05:23 [name] ([id number]) -------------------------------------------------------------------------------- Pain Assessment Details Patient Name: [name]. Date of Service: 11/06/2019 2:30 PM Medical Record Number: [id number] Patient Account Number: [id number] Date of Birth/Sex: 1932/06/18 (85 y.o. M) Treating RN: Harold Barban Primary Care Denean Pavon: Derinda Late Other Clinician: Referring Zoya Sprecher: BABAOFF, MARCUS Treating Raelle Chambers/Extender: STONE III, HOYT Weeks in Treatment: 2 Active Problems Location of Pain Severity and Description of Pain Patient Has Paino No Site Locations Pain Management and Medication Current Pain Management: Electronic Signature(s) Signed: 11/06/2019 4:33:53 PM By: Harold Barban Entered By: Harold Barban on 11/06/2019 14:30:51 [name] ([id number]) -------------------------------------------------------------------------------- Patient/Caregiver Education Details Patient Name: [name]. Date of Service: 11/06/2019 2:30 PM Medical Record Number: [id number] Patient Account Number: [id number] Date of Birth/Gender: 06-25-1932 (85 y.o. M) Treating RN: Army Melia Primary Care Physician: Derinda Late Other Clinician: Referring Physician: BABAOFF, MARCUS Treating Physician/Extender: Melburn Hake, HOYT Weeks in Treatment: 2 Education Assessment Education Provided To: Patient Education Topics Provided Wound/Skin Impairment: Handouts: Caring for Your Ulcer Methods: Demonstration, Explain/Verbal Responses: State content correctly Electronic Signature(s) Signed: 11/06/2019 4:33:56 PM By: Army Melia Entered By: Army Melia on 11/06/2019 15:26:54 [name] ([id number]) -------------------------------------------------------------------------------- Wound Assessment Details Patient Name: Keith Gutting C. Date of Service: 11/06/2019 2:30 PM Medical Record Number: [id number] Patient Account Number: [id number] Date of Birth/Sex: 1932-01-27 (85 y.o. M) Treating RN: Harold Barban Primary Care Shalamar Crays: BABAOFF, MARCUS Other Clinician: Referring Kateland Leisinger: BABAOFF, MARCUS Treating Ramses Klecka/Extender: STONE III, HOYT Weeks in Treatment: 2 Wound Status Wound Number: 1 Primary Etiology: Skin Tear Wound Location: Right Forearm Wound Status: Open Wounding Event: Trauma Comorbid History: Anemia, Hypertension Date Acquired: 10/22/2019 Weeks Of Treatment: 2 Clustered Wound: No Photos Wound Measurements Length: (cm) 4 Width: (cm) 1 Depth: (cm) 0.1 Area: (cm) 3.142 Volume: (cm) 0.314 % Reduction in Area: 69.2% % Reduction in Volume: 69.2% Epithelialization: None Tunneling: No Undermining: No Wound Description Full  Thickness Without Exposed Support Foul Odo Classification: Structures Slough/F Wound Margin: Flat and Intact Exudate Medium Amount: Exudate Type: Sanguinous Exudate Color: red r After Cleansing: No ibrino No Wound Bed Granulation Amount: Medium (34-66%) Exposed Structure Granulation Quality: Red Fascia Exposed: No Necrotic Amount: Medium (34-66%) Fat Layer (Subcutaneous Tissue) Exposed: Yes Necrotic  Quality: Adherent Slough Tendon Exposed: No Muscle Exposed: No Joint Exposed: No Bone Exposed: No [name], Keith C. ([id number]) Treatment Notes Wound #1 (Right Forearm) Notes xeroform, telfa pad, conform, stetch net Electronic Signature(s) Signed: 11/06/2019 4:33:53 PM By: Harold Barban Entered By: Harold Barban on 11/06/2019 14:34:43 [name], [name] ([id number]) -------------------------------------------------------------------------------- Vitals Details Patient Name: [name]. Date of Service: 11/06/2019 2:30 PM Medical Record Number: [id number] Patient Account Number: [id number] Date of Birth/Sex: August 04, 1932 (85 y.o. M) Treating RN: Harold Barban Primary Care Mauricio Dahlen: BABAOFF, MARCUS Other Clinician: Referring Mcclain Shall: BABAOFF, MARCUS Treating Latissa Frick/Extender: STONE III, HOYT Weeks in Treatment: 2 Vital Signs Time Taken: 14:30 Temperature (F): 98.0 Height (in): 68 Pulse (bpm): 66 Weight (lbs): 173 Respiratory Rate (breaths/min): 16 Body Mass Index (BMI): 26.3 Blood Pressure (mmHg): 142/70 Reference Range: 80 - 120 mg / dl Electronic Signature(s) Signed: 11/06/2019 4:33:53 PM By: Harold Barban Entered By: Harold Barban on 11/06/2019 14:32:35

## 2019-11-06 NOTE — Progress Notes (Addendum)
[name], [name] ([id number]) Visit Report for 11/06/2019 Chief Complaint Document Details Patient Name: [name], [name]. Date of Service: 11/06/2019 2:30 PM Medical Record Number: [id number] Patient Account Number: [id number] Date of Birth/Sex: 22-Nov-1931 (85 y.o. M) Treating RN: Army Melia Primary Care Provider: Derinda Late Other Clinician: Referring Provider: BABAOFF, MARCUS Treating Provider/Extender: STONE III, [name] Weeks in Treatment: 2 Information Obtained from: Patient Chief Complaint Right forearm skin tear Electronic Signature(s) Signed: 11/06/2019 2:41:31 PM By: Worthy Keeler PA-C Entered By: Worthy Keeler on 11/06/2019 14:41:30 [name], [name] ([id number]) -------------------------------------------------------------------------------- HPI Details Patient Name: [name] Date of Service: 11/06/2019 2:30 PM Medical Record Number: [id number] Patient Account Number: [id number] Date of Birth/Sex: 1932-07-24 (85 y.o. M) Treating RN: Army Melia Primary Care Provider: Derinda Late Other Clinician: Referring Provider: BABAOFF, MARCUS Treating Provider/Extender: [name], [name] Weeks in Treatment: 2 History of Present Illness HPI Description: 10/23/2019 on evaluation today patient appears to be doing poorly in regard to the right forearm skin tear which occurred yesterday when he was playing golf. He tells me that he was going down a hill and unfortunately slipped it was an area he was not supposed to be in. He was going to just play one whole after doing some practice shots. Fortunately he did not have any severe injuries but unfortunately he does have a skin tear that obviously is needing to be managed at this point. He does have hypertension which is fairly well controlled. He also has some discomfort but he tells me nothing to tremendous at this point. Fortunately there is no signs of active infection at this time. The skin unfortunately somewhat  peeled back they attempted to pull this back over but have not been able to get it to fully cover the wound. I think that we will be able to once we completely unfold the skin and I explained to him we would have to roll this back and completely release it from the bed of the wound in order to get it to cover the area as a whole. He is okay with that. 10/30/2019 upon evaluation today patient actually appears to be doing quite well with regard to his forearm ulcer. He has been tolerating the dressing changes without complication. Fortunately there is no signs of active infection at this time. No fevers, chills, nausea, vomiting, or diarrhea. Overall a lot of the tissue did reattach to the base of the wound although he does have some superficial epidermal tissue that is sloughing off underneath this has reattached quite nicely and seems to have healed nonetheless. There is still some open wound areas. His dressing was quite stuck likely due to bleeding. 11/06/2019 on evaluation today patient appears to be doing excellent in regard to his forearm ulcer. This is showing signs of excellent improvement and overall feel like he is healing quite nicely. Fortunately there is no signs of active infection at this time. No fever chills noted Electronic Signature(s) Signed: 11/07/2019 11:41:09 AM By: Worthy Keeler PA-C Previous Signature: 11/06/2019 3:28:33 PM Version By: Worthy Keeler PA-C Entered By: Worthy Keeler on 11/07/2019 11:41:08 [name] ([id number]) -------------------------------------------------------------------------------- Physical Exam Details Patient Name: [name]. Date of Service: 11/06/2019 2:30 PM Medical Record Number: [id number] Patient Account Number: [id number] Date of Birth/Sex: 1931/12/06 (85 y.o. M) Treating RN: Army Melia Primary Care Provider: Derinda Late Other Clinician: Referring Provider: BABAOFF, MARCUS Treating Provider/Extender: STONE  III, [name] Weeks in Treatment: 2 Constitutional Well-nourished and well-hydrated in no acute  distress. Respiratory normal breathing without difficulty. clear to auscultation bilaterally. Cardiovascular regular rate and rhythm with normal S1, S2. Psychiatric this patient is able to make decisions and demonstrates good insight into disease process. Alert and Oriented x 3. pleasant and cooperative. Notes Patient's wound showed signs of excellent epithelization and seems to be healing quite nicely. I am very pleased with how things are progressing. The patient likewise is having really no significant pain and seems to be doing quite well. Overall I think he is very close to closing and may be in the next week although it still could still take a few extra weeks before this completely seals up. Electronic Signature(s) Signed: 11/06/2019 3:29:06 PM By: Worthy Keeler PA-C Entered By: Worthy Keeler on 11/06/2019 15:29:05 [name] ([id number]) -------------------------------------------------------------------------------- Physician Orders Details Patient Name: [name], [name]. Date of Service: 11/06/2019 2:30 PM Medical Record Number: [id number] Patient Account Number: [id number] Date of Birth/Sex: May 24, 1932 (85 y.o. M) Treating RN: Army Melia Primary Care Provider: Derinda Late Other Clinician: Referring Provider: BABAOFF, MARCUS Treating Provider/Extender: STONE III, [name] Weeks in Treatment: 2 Verbal / Phone Orders: No Diagnosis Coding ICD-10 Coding Code Description S51.801A Unspecified open wound of right forearm, initial encounter I10 Essential (primary) hypertension Wound Cleansing Wound #1 Right Forearm o Clean wound with Normal Saline. Anesthetic (add to Medication List) Wound #1 Right Forearm o Topical Lidocaine 4% cream applied to wound bed prior to debridement (In Clinic Only). Primary Wound Dressing Wound #1 Right Forearm o Xeroform Secondary  Dressing Wound #1 Right Forearm o Gauze and Kerlix/Conform Dressing Change Frequency Wound #1 Right Forearm o Change dressing every other day. Follow-up Appointments Wound #1 Right Forearm o Return Appointment in 1 week. Electronic Signature(s) Signed: 11/06/2019 4:33:56 PM By: Army Melia Signed: 11/07/2019 10:58:36 AM By: Worthy Keeler PA-C Entered By: Army Melia on 11/06/2019 15:26:03 [name] ([id number]) -------------------------------------------------------------------------------- Problem List Details Patient Name: [name], [name]. Date of Service: 11/06/2019 2:30 PM Medical Record Number: [id number] Patient Account Number: [id number] Date of Birth/Sex: 01-12-1932 (85 y.o. M) Treating RN: Army Melia Primary Care Provider: Derinda Late Other Clinician: Referring Provider: BABAOFF, MARCUS Treating Provider/Extender: [name], [name] Weeks in Treatment: 2 Active Problems ICD-10 Evaluated Encounter Code Description Active Date Today Diagnosis S51.801A Unspecified open wound of right forearm, initial encounter 10/23/2019 No Yes I10 Essential (primary) hypertension 10/23/2019 No Yes Inactive Problems Resolved Problems Electronic Signature(s) Signed: 11/06/2019 2:41:26 PM By: Worthy Keeler PA-C Entered By: Worthy Keeler on 11/06/2019 14:41:25 [name], [name] ([id number]) -------------------------------------------------------------------------------- Progress Note Details Patient Name: [name]. Date of Service: 11/06/2019 2:30 PM Medical Record Number: [id number] Patient Account Number: [id number] Date of Birth/Sex: 1932/01/30 (85 y.o. M) Treating RN: Army Melia Primary Care Provider: Derinda Late Other Clinician: Referring Provider: BABAOFF, MARCUS Treating Provider/Extender: [name], Fleet Higham Weeks in Treatment: 2 Subjective Chief Complaint Information obtained from Patient Right forearm skin tear History of Present  Illness (HPI) 10/23/2019 on evaluation today patient appears to be doing poorly in regard to the right forearm skin tear which occurred yesterday when he was playing golf. He tells me that he was going down a hill and unfortunately slipped it was an area he was not supposed to be in. He was going to just play one whole after doing some practice shots. Fortunately he did not have any severe injuries but unfortunately he does have a skin tear that obviously is needing to be managed at this point. He does have hypertension which  is fairly well controlled. He also has some discomfort but he tells me nothing to tremendous at this point. Fortunately there is no signs of active infection at this time. The skin unfortunately somewhat peeled back they attempted to pull this back over but have not been able to get it to fully cover the wound. I think that we will be able to once we completely unfold the skin and I explained to him we would have to roll this back and completely release it from the bed of the wound in order to get it to cover the area as a whole. He is okay with that. 10/30/2019 upon evaluation today patient actually appears to be doing quite well with regard to his forearm ulcer. He has been tolerating the dressing changes without complication. Fortunately there is no signs of active infection at this time. No fevers, chills, nausea, vomiting, or diarrhea. Overall a lot of the tissue did reattach to the base of the wound although he does have some superficial epidermal tissue that is sloughing off underneath this has reattached quite nicely and seems to have healed nonetheless. There is still some open wound areas. His dressing was quite stuck likely due to bleeding. 11/06/2019 on evaluation today patient appears to be doing excellent in regard to his forearm ulcer. This is showing signs of excellent improvement and overall feel like he is healing quite nicely. Fortunately there is no signs of  active infection at this time. No fever chills noted Patient History Information obtained from Patient. Family History Cancer - Father, Kidney Disease - Mother, No family history of Diabetes, Heart Disease, Hereditary Spherocytosis, Hypertension, Lung Disease, Seizures, Stroke, Thyroid Problems, Tuberculosis. Social History Never smoker, Marital Status - Married, Alcohol Use - Daily, Drug Use - No History, Caffeine Use - Never. Medical History Eyes Denies history of Cataracts, Glaucoma, Optic Neuritis Hematologic/Lymphatic Patient has history of Anemia Denies history of Hemophilia, Human Immunodeficiency Virus, Lymphedema, Sickle Cell Disease Cardiovascular Patient has history of Hypertension Denies history of Angina, Arrhythmia, Congestive Heart Failure, Coronary Artery Disease, Deep Vein Thrombosis, Grandpre, Maninder C. 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 Respiratory Denies complaints or symptoms of Chronic or frequent coughs, Shortness of Breath. Cardiovascular Denies complaints or symptoms of Chest pain, LE edema. Psychiatric Denies complaints or symptoms of Anxiety, Claustrophobia. Objective Constitutional Well-nourished and well-hydrated in no acute distress. Vitals Time Taken: 2:30 PM, Height: 68 in, Weight: 173 lbs, BMI: 26.3, Temperature: 98.0 F, Pulse: 66 bpm,  Respiratory Rate: 16 breaths/min, Blood Pressure: 142/70 mmHg. Respiratory normal breathing without difficulty. clear to auscultation bilaterally. Cardiovascular regular rate and rhythm with normal S1, S2. Psychiatric this patient is able to make decisions and demonstrates good insight into disease process. Alert and Oriented x 3. pleasant and cooperative. General Notes: Patient's wound showed signs of excellent epithelization and seems to be healing quite nicely. I am very pleased with how things are progressing. The patient likewise is having really no significant pain and seems to be doing quite well. Overall I think he is very close to closing and may be in the  next week although it still could still take a few extra weeks before this completely seals up. Integumentary (Hair, Skin) Wound #1 status is Open. Original cause of wound was Trauma. The wound is located on the Right Forearm. The wound [name], Keith C. ([id number]) measures 4cm length x 1cm width x 0.1cm depth; 3.142cm^2 area and 0.314cm^3 volume. There is Fat Layer (Subcutaneous Tissue) Exposed exposed. There is no tunneling or undermining noted. There is a medium amount of sanguinous drainage noted. The wound margin is flat and intact. There is medium (34-66%) red granulation within the wound bed. There is a medium (34-66%) amount of necrotic tissue within the wound bed including Adherent Slough. Assessment Active Problems ICD-10 Unspecified open wound of right forearm, initial encounter Essential (primary) hypertension Plan Wound Cleansing: Wound #1 Right Forearm: Clean wound with Normal Saline. Anesthetic (add to Medication List): Wound #1 Right Forearm: Topical Lidocaine 4% cream applied to wound bed prior to debridement (In Clinic Only). Primary Wound Dressing: Wound #1 Right Forearm: Xeroform Secondary Dressing: Wound #1 Right Forearm: Gauze and Kerlix/Conform Dressing Change Frequency: Wound #1 Right  Forearm: Change dressing every other day. Follow-up Appointments: Wound #1 Right Forearm: Return Appointment in 1 week. 1. My suggestion at this time is that we continue with the Xeroform dressing currently. I think the patient seems to be doing well with this and I see no reason to change. 2. I would recommend as well that he continue to be active I do not think there is any problems and he has been being fairly active even at this point with no issues. I think this is very close to complete resolution. We will see patient back for reevaluation in 1 week here in the clinic. If anything worsens or changes patient will contact our office for additional recommendations. Electronic Signature(s) Signed: 11/07/2019 11:41:21 AM By: Worthy Keeler PA-C Previous Signature: 11/06/2019 3:29:58 PM Version By: [facility], [place] ([id number]) Entered By: Worthy Keeler on 11/07/2019 11:41:20 [name], [name] ([id number]) -------------------------------------------------------------------------------- ROS/PFSH Details Patient Name: [name] Date of Service: 11/06/2019 2:30 PM Medical Record Number: [id number] Patient Account Number: [id number] Date of Birth/Sex: July 18, 1932 (85 y.o. M) Treating RN: Army Melia Primary Care Provider: Derinda Late Other Clinician: Referring Provider: BABAOFF, MARCUS Treating Provider/Extender: STONE III, Chapel [place]thorn Weeks in Treatment: 2 Information Obtained From Patient Constitutional Symptoms (General Health) Complaints and Symptoms: Negative for: Fatigue; Fever; Chills; Marked Weight Change Respiratory Complaints and Symptoms: Negative for: Chronic or frequent coughs; Shortness of Breath Cardiovascular Complaints and Symptoms: Negative for: Chest pain; LE edema Medical History: Positive for: Hypertension Negative for: Angina; Arrhythmia; Congestive Heart Failure; Coronary Artery Disease; Deep Vein Thrombosis;  Hypotension; Myocardial Infarction; Peripheral Arterial Disease; Peripheral Venous Disease; Phlebitis; Vasculitis Psychiatric Complaints and Symptoms: Negative for: Anxiety; Claustrophobia Eyes Medical History: Negative for: Cataracts; Glaucoma; Optic Neuritis Past Medical History Notes: retinal detachment - 1994; prosthetic left eye Hematologic/Lymphatic Medical History: Positive for: Anemia Negative for: Hemophilia; Human Immunodeficiency Virus; Lymphedema; Sickle Cell Disease Past Medical History Notes: idiopathic thrombocytopenic purpura; autoimmune hemolytic anemia Integumentary (Skin) Medical History: Negative for: History of Burn; History of pressure wounds Oncologic [name], [name] ([id number]) Medical History: Negative for: Received Chemotherapy; Received Radiation Past Medical History Notes: prostate cancer - TURP 2016 Immunizations Pneumococcal Vaccine: Received Pneumococcal Vaccination: Yes Implantable Devices None Family and Social History Cancer: Yes - Father; Diabetes: No; Heart Disease: No; Hereditary Spherocytosis: No; Hypertension: No; Kidney Disease: Yes - Mother; Lung Disease: No; Seizures: No;  Stroke: No; Thyroid Problems: No; Tuberculosis: No; Never smoker; Marital Status - Married; Alcohol Use: Daily; Drug Use: No History; Caffeine Use: Never; Financial Concerns: No; Food, Clothing or Shelter Needs: No; Support System Lacking: No; Transportation Concerns: No Physician Affirmation I have reviewed and agree with the above information. Electronic Signature(s) Signed: 11/06/2019 4:33:56 PM By: Army Melia Signed: 11/07/2019 10:58:36 AM By: Worthy Keeler PA-C Entered By: Worthy Keeler on 11/06/2019 15:28:49 [name], ESTELLA ([id number]) -------------------------------------------------------------------------------- SuperBill Details Patient Name: [name]. Date of Service: 11/06/2019 Medical Record Number: [id number] Patient Account  Number: [id number] Date of Birth/Sex: 1932/07/31 (85 y.o. M) Treating RN: Army Melia Primary Care Provider: Derinda Late Other Clinician: Referring Provider: BABAOFF, MARCUS Treating Provider/Extender: STONE III, Melana Hingle Weeks in Treatment: 2 Diagnosis Coding ICD-10 Codes Code Description S51.801A Unspecified open wound of right forearm, initial encounter Slater (primary) hypertension Facility Procedures CPT4 Code: [id number] Description: 99213 - WOUND CARE VISIT-LEV 3 [name] PT Modifier: Quantity: 1 Physician Procedures CPT4 Code: [id number] Description: [id number] - WC PHYS LEVEL 4 - [name] PT ICD-10 Diagnosis Description [id number] Unspecified open wound of right forearm, initial encounte I10 Essential (primary) hypertension Modifier: r Quantity: 1 Electronic Signature(s) Signed: 11/06/2019 3:30:34 PM By: Worthy Keeler PA-C Entered By: Worthy Keeler on 11/06/2019 15:30:34

## 2019-11-13 ENCOUNTER — Other Ambulatory Visit: Payer: Self-pay

## 2019-11-13 ENCOUNTER — Encounter: Payer: PPO | Admitting: Physician Assistant

## 2019-11-13 DIAGNOSIS — L98499 Non-pressure chronic ulcer of skin of other sites with unspecified severity: Secondary | ICD-10-CM | POA: Diagnosis not present

## 2019-11-13 DIAGNOSIS — L98492 Non-pressure chronic ulcer of skin of other sites with fat layer exposed: Secondary | ICD-10-CM | POA: Diagnosis not present

## 2019-11-13 NOTE — Progress Notes (Addendum)
[name], [name] ([id number]) Visit Report for 11/13/2019 Chief Complaint Document Details Patient Name: [name], [name]. Date of Service: 11/13/2019 10:00 AM Medical Record Number: [id number] Patient Account Number: [id number] Date of Birth/Sex: 1932-04-12 (85 y.o. M) Treating RN: Army Melia Primary Care Provider: Derinda Late Other Clinician: Referring Provider: BABAOFF, MARCUS Treating Provider/Extender: STONE III, Renezmae Canlas Weeks in Treatment: 3 Information Obtained from: Patient Chief Complaint Right forearm skin tear Electronic Signature(s) Signed: 11/13/2019 10:30:57 AM By: Worthy Keeler PA-C Entered By: Worthy Keeler on 11/13/2019 10:30:57 [name] ([id number]) -------------------------------------------------------------------------------- HPI Details Patient Name: [name] Date of Service: 11/13/2019 10:00 AM Medical Record Number: [id number] Patient Account Number: [id number] Date of Birth/Sex: 07/27/1932 (85 y.o. M) Treating RN: Army Melia Primary Care Provider: Derinda Late Other Clinician: Referring Provider: BABAOFF, MARCUS Treating Provider/Extender: Melburn Hake, Laniece Hornbaker Weeks in Treatment: 3 History of Present Illness HPI Description: 10/23/2019 on evaluation today patient appears to be doing poorly in regard to the right forearm skin tear which occurred yesterday when he was playing golf. He tells me that he was going down a hill and unfortunately slipped it was an area he was not supposed to be in. He was going to just play one whole after doing some practice shots. Fortunately he did not have any severe injuries but unfortunately he does have a skin tear that obviously is needing to be managed at this point. He does have hypertension which is fairly well controlled. He also has some discomfort but he tells me nothing to tremendous at this point. Fortunately there is no signs of active infection at this time. The skin unfortunately somewhat  peeled back they attempted to pull this back over but have not been able to get it to fully cover the wound. I think that we will be able to once we completely unfold the skin and I explained to him we would have to roll this back and completely release it from the bed of the wound in order to get it to cover the area as a whole. He is okay with that. 10/30/2019 upon evaluation today patient actually appears to be doing quite well with regard to his forearm ulcer. He has been tolerating the dressing changes without complication. Fortunately there is no signs of active infection at this time. No fevers, chills, nausea, vomiting, or diarrhea. Overall a lot of the tissue did reattach to the base of the wound although he does have some superficial epidermal tissue that is sloughing off underneath this has reattached quite nicely and seems to have healed nonetheless. There is still some open wound areas. His dressing was quite stuck likely due to bleeding. 11/06/2019 on evaluation today patient appears to be doing excellent in regard to his forearm ulcer. This is showing signs of excellent improvement and overall feel like he is healing quite nicely. Fortunately there is no signs of active infection at this time. No fever chills noted 11/13/2019 on evaluation today patient appears to be doing excellent in regard to his forearm ulcer. This is almost completely healed there is just a very small area remaining at this point. Fortunately there is no signs of active infection at this time. Electronic Signature(s) Signed: 11/13/2019 10:50:41 AM By: Worthy Keeler PA-C Entered By: Worthy Keeler on 11/13/2019 10:50:41 [name] ([id number]) -------------------------------------------------------------------------------- Physical Exam Details Patient Name: [name], [name]. Date of Service: 11/13/2019 10:00 AM Medical Record Number: [id number] Patient Account Number: [id number] Date of Birth/Sex:  [birth year] (85 y.o.  M) Treating RN: Army Melia Primary Care Provider: Derinda Late Other Clinician: Referring Provider: BABAOFF, MARCUS Treating Provider/Extender: STONE III, Deontay Ladnier Weeks in Treatment: 3 Constitutional Well-nourished and well-hydrated in no acute distress. Respiratory normal breathing without difficulty. clear to auscultation bilaterally. Cardiovascular regular rate and rhythm with normal S1, S2. Psychiatric this patient is able to make decisions and demonstrates good insight into disease process. Alert and Oriented x 3. pleasant and cooperative. Notes Patient's wound bed currently showed excellent granulation at this point and epithelization overall I am very pleased and he is almost close. With that being said I think that this will likely take 1 more week and it will be closed. Since we are actually not in the office Thursday and Friday of next week I would recommend a 2-week check for him. However if he feels like the wound is completely closed before that time he can contact the office and let us know we will cancel his follow-up visit and document that he is healed at that point. The patient is in agreement with the plan. Electronic Signature(s) Signed: 11/13/2019 10:51:21 AM By: Worthy Keeler PA-C Entered By: Worthy Keeler on 11/13/2019 10:51:21 [name], [name] ([id number]) -------------------------------------------------------------------------------- Physician Orders Details Patient Name: [name], [name]. Date of Service: 11/13/2019 10:00 AM Medical Record Number: [id number] Patient Account Number: [id number] Date of Birth/Sex: 24-Mar-1932 (85 y.o. M) Treating RN: Army Melia Primary Care Provider: Derinda Late Other Clinician: Referring Provider: BABAOFF, MARCUS Treating Provider/Extender: STONE III, Ladye Macnaughton Weeks in Treatment: 3 Verbal / Phone Orders: No Diagnosis Coding ICD-10 Coding Code Description S51.801A Unspecified open wound of  right forearm, initial encounter I10 Essential (primary) hypertension Wound Cleansing Wound #1 Right Forearm o Clean wound with Normal Saline. Anesthetic (add to Medication List) Wound #1 Right Forearm o Topical Lidocaine 4% cream applied to wound bed prior to debridement (In Clinic Only). Primary Wound Dressing Wound #1 Right Forearm o Xeroform Secondary Dressing Wound #1 Right Forearm o Gauze and Kerlix/Conform Dressing Change Frequency Wound #1 Right Forearm o Change dressing every other day. Follow-up Appointments Wound #1 Right Forearm o Return Appointment in 2 weeks. Electronic Signature(s) Signed: 11/13/2019 12:15:27 PM By: Army Melia Signed: 11/13/2019 12:19:39 PM By: Worthy Keeler PA-C Entered By: Army Melia on 11/13/2019 10:47:11 [name], [name] ([id number]) -------------------------------------------------------------------------------- Problem List Details Patient Name: [name], [name]. Date of Service: 11/13/2019 10:00 AM Medical Record Number: [id number] Patient Account Number: [id number] Date of Birth/Sex: Dec 03, 1931 (85 y.o. M) Treating RN: Army Melia Primary Care Provider: Derinda Late Other Clinician: Referring Provider: BABAOFF, MARCUS Treating Provider/Extender: Melburn Hake, Juliahna Wiswell Weeks in Treatment: 3 Active Problems ICD-10 Evaluated Encounter Code Description Active Date Today Diagnosis S51.801A Unspecified open wound of right forearm, initial encounter 10/23/2019 No Yes I10 Essential (primary) hypertension 10/23/2019 No Yes Inactive Problems Resolved Problems Electronic Signature(s) Signed: 11/13/2019 10:30:49 AM By: Worthy Keeler PA-C Entered By: Worthy Keeler on 11/13/2019 10:30:49 [name] ([id number]) -------------------------------------------------------------------------------- Progress Note Details Patient Name: [name]. Date of Service: 11/13/2019 10:00 AM Medical Record Number:  [id number] Patient Account Number: [id number] Date of Birth/Sex: [birth year] (85 y.o. M) Treating RN: Army Melia Primary Care Provider: Derinda Late Other Clinician: Referring Provider: BABAOFF, MARCUS Treating Provider/Extender: Melburn Hake, Davionne Dowty Weeks in Treatment: 3 Subjective Chief Complaint Information obtained from Patient Right forearm skin tear History of Present Illness (HPI) 10/23/2019 on evaluation today patient appears to be doing poorly in regard to the right forearm skin tear which occurred yesterday when he was  playing golf. He tells me that he was going down a hill and unfortunately slipped it was an area he was not supposed to be in. He was going to just play one whole after doing some practice shots. Fortunately he did not have any severe injuries but unfortunately he does have a skin tear that obviously is needing to be managed at this point. He does have hypertension which is fairly well controlled. He also has some discomfort but he tells me nothing to tremendous at this point. Fortunately there is no signs of active infection at this time. The skin unfortunately somewhat peeled back they attempted to pull this back over but have not been able to get it to fully cover the wound. I think that we will be able to once we completely unfold the skin and I explained to him we would have to roll this back and completely release it from the bed of the wound in order to get it to cover the area as a whole. He is okay with that. 10/30/2019 upon evaluation today patient actually appears to be doing quite well with regard to his forearm ulcer. He has been tolerating the dressing changes without complication. Fortunately there is no signs of active infection at this time. No fevers, chills, nausea, vomiting, or diarrhea. Overall a lot of the tissue did reattach to the base of the wound although he does have some superficial epidermal tissue that is sloughing off underneath this has  reattached quite nicely and seems to have healed nonetheless. There is still some open wound areas. His dressing was quite stuck likely due to bleeding. 11/06/2019 on evaluation today patient appears to be doing excellent in regard to his forearm ulcer. This is showing signs of excellent improvement and overall feel like he is healing quite nicely. Fortunately there is no signs of active infection at this time. No fever chills noted 11/13/2019 on evaluation today patient appears to be doing excellent in regard to his forearm ulcer. This is almost completely healed there is just a very small area remaining at this point. Fortunately there is no signs of active infection at this time. Patient History Information obtained from Patient. Family History Cancer - Father, Kidney Disease - Mother, No family history of Diabetes, Heart Disease, Hereditary Spherocytosis, Hypertension, Lung Disease, Seizures, Stroke, Thyroid Problems, Tuberculosis. Social History Never smoker, Marital Status - Married, Alcohol Use - Daily, Drug Use - No History, Caffeine Use - Never. Medical History Eyes Denies history of Cataracts, Glaucoma, Optic Neuritis Hematologic/Lymphatic Patient has history of Anemia Denies history of Hemophilia, Human Immunodeficiency Virus, Lymphedema, Sickle Cell Disease [name], Keith C. ([id number]) Cardiovascular Patient has history of Hypertension Denies history of Angina, Arrhythmia, Congestive Heart Failure, Coronary Artery Disease, Deep Vein Thrombosis, Hypotension, Myocardial Infarction, Peripheral Arterial Disease, Peripheral Venous Disease, Phlebitis, Vasculitis Integumentary (Skin) Denies history of History of Burn, History of pressure wounds Oncologic Denies history of Received Chemotherapy, Received Radiation Medical And Surgical History Notes Eyes retinal detachment - 1994; prosthetic left eye Hematologic/Lymphatic idiopathic thrombocytopenic purpura; autoimmune  hemolytic anemia Oncologic prostate cancer - TURP 2016 Review of Systems (ROS) Constitutional Symptoms (General Health) Denies complaints or symptoms of Fatigue, Fever, Chills, Marked Weight Change. Respiratory Denies complaints or symptoms of Chronic or frequent coughs, Shortness of Breath. Cardiovascular Denies complaints or symptoms of Chest pain, LE edema. Psychiatric Denies complaints or symptoms of Anxiety, Claustrophobia. Objective Constitutional Well-nourished and well-hydrated in no acute distress. Vitals Time Taken: 10:14 AM, Height: 68 in, Weight: 173  lbs, BMI: 26.3, Temperature: 98.4 F, Pulse: 78 bpm, Respiratory Rate: 16 breaths/min, Blood Pressure: 117/77 mmHg. Respiratory normal breathing without difficulty. clear to auscultation bilaterally. Cardiovascular regular rate and rhythm with normal S1, S2. Psychiatric this patient is able to make decisions and demonstrates good insight into disease process. Alert and Oriented x 3. pleasant and cooperative. General Notes: Patient's wound bed currently showed excellent granulation at this point and epithelization overall I am very pleased and he is almost close. With that being said I think that this will likely take 1 more week and it will be closed. Since we are actually not in the office Thursday and Friday of next week I would recommend a 2-week check for him. However if he feels like the wound is completely closed before that time he can contact the office and let us know we will cancel his followPRESTON, GUIDA. ([id number]) up visit and document that he is healed at that point. The patient is in agreement with the plan. Integumentary (Hair, Skin) Wound #1 status is Open. Original cause of wound was Trauma. The wound is located on the Right Forearm. The wound measures 0.2cm length x 0.2cm width x 0.1cm depth; 0.031cm^2 area and 0.003cm^3 volume. There is Fat Layer (Subcutaneous Tissue) Exposed exposed. There is no  tunneling or undermining noted. There is a medium amount of sanguinous drainage noted. The wound margin is flat and intact. There is large (67-100%) red granulation within the wound bed. There is no necrotic tissue within the wound bed. Assessment Active Problems ICD-10 Unspecified open wound of right forearm, initial encounter Essential (primary) hypertension Plan Wound Cleansing: Wound #1 Right Forearm: Clean wound with Normal Saline. Anesthetic (add to Medication List): Wound #1 Right Forearm: Topical Lidocaine 4% cream applied to wound bed prior to debridement (In Clinic Only). Primary Wound Dressing: Wound #1 Right Forearm: Xeroform Secondary Dressing: Wound #1 Right Forearm: Gauze and Kerlix/Conform Dressing Change Frequency: Wound #1 Right Forearm: Change dressing every other day. Follow-up Appointments: Wound #1 Right Forearm: Return Appointment in 2 weeks. 1 I would recommend that we continue with the Xeroform gauze dressing at this point since that seems to be doing excellent on a recommend we continue as such. 2. I am to recommend as well that we go ahead and continue with the current wound care measures as far as keeping the area wrap that keeps adhesive off the skin and I think that is done well for him. 3. We will get a see him back in 2 weeks though if he heals before that time he will contact the office and let me know and we will close out his wound at that point canceling his follow-up visit. We will see patient back for reevaluation in 2 weeks here in the clinic. If anything worsens or changes patient will contact our office for additional recommendations. [name], [name] ([id number]) Electronic Signature(s) Signed: 11/13/2019 10:51:54 AM By: Worthy Keeler PA-C Entered By: Worthy Keeler on 11/13/2019 10:51:54 [name], [name] ([id number]) -------------------------------------------------------------------------------- ROS/PFSH Details Patient Name:  [name] Date of Service: 11/13/2019 10:00 AM Medical Record Number: [id number] Patient Account Number: [id number] Date of Birth/Sex: December 30, 1931 (85 y.o. M) Treating RN: Army Melia Primary Care Provider: Derinda Late Other Clinician: Referring Provider: BABAOFF, MARCUS Treating Provider/Extender: STONE III, Jolanda Mccann Weeks in Treatment: 3 Information Obtained From Patient Constitutional Symptoms (General Health) Complaints and Symptoms: Negative for: Fatigue; Fever; Chills; Marked Weight Change Respiratory Complaints and Symptoms: Negative for: Chronic or frequent coughs;  Shortness of Breath Cardiovascular Complaints and Symptoms: Negative for: Chest pain; LE edema Medical History: Positive for: Hypertension Negative for: Angina; Arrhythmia; Congestive Heart Failure; Coronary Artery Disease; Deep Vein Thrombosis; Hypotension; Myocardial Infarction; Peripheral Arterial Disease; Peripheral Venous Disease; Phlebitis; Vasculitis Psychiatric Complaints and Symptoms: Negative for: Anxiety; Claustrophobia Eyes Medical History: Negative for: Cataracts; Glaucoma; Optic Neuritis Past Medical History Notes: retinal detachment - 1994; prosthetic left eye Hematologic/Lymphatic Medical History: Positive for: Anemia Negative for: Hemophilia; Human Immunodeficiency Virus; Lymphedema; Sickle Cell Disease Past Medical History Notes: idiopathic thrombocytopenic purpura; autoimmune hemolytic anemia Integumentary (Skin) Medical History: Negative for: History of Burn; History of pressure wounds Oncologic [name], SLIVINSKI ([id number]) Medical History: Negative for: Received Chemotherapy; Received Radiation Past Medical History Notes: prostate cancer - TURP 2016 Immunizations Pneumococcal Vaccine: Received Pneumococcal Vaccination: Yes Implantable Devices None Family and Social History Cancer: Yes - Father; Diabetes: No; Heart Disease: No; Hereditary Spherocytosis: No;  Hypertension: No; Kidney Disease: Yes - Mother; Lung Disease: No; Seizures: No; Stroke: No; Thyroid Problems: No; Tuberculosis: No; Never smoker; Marital Status - Married; Alcohol Use: Daily; Drug Use: No History; Caffeine Use: Never; Financial Concerns: No; Food, Clothing or Shelter Needs: No; Support System Lacking: No; Transportation Concerns: No Physician Affirmation I have reviewed and agree with the above information. Electronic Signature(s) Signed: 11/13/2019 12:15:27 PM By: Army Melia Signed: 11/13/2019 12:19:39 PM By: Worthy Keeler PA-C Entered By: Worthy Keeler on 11/13/2019 10:50:57 [name] ([id number]) -------------------------------------------------------------------------------- SuperBill Details Patient Name: [name] Date of Service: 11/13/2019 Medical Record Number: [id number] Patient Account Number: [id number] Date of Birth/Sex: [birth year] (85 y.o. M) Treating RN: Army Melia Primary Care Provider: Derinda Late Other Clinician: Referring Provider: BABAOFF, MARCUS Treating Provider/Extender: STONE III, Loralai Eisman Weeks in Treatment: 3 Diagnosis Coding ICD-10 Codes Code Description [id number] Unspecified open wound of right forearm, initial encounter [place] (primary) hypertension Facility Procedures CPT4 Code: [id number] Description: 99213 - WOUND CARE VISIT-LEV 3 EST PT Modifier: Quantity: 1 Physician Procedures CPT4 Code: [id number] Description: [id number] - WC PHYS LEVEL 4 - EST PT ICD-10 Diagnosis Description [id number] Unspecified open wound of right forearm, initial encounte I10 Essential (primary) hypertension Modifier: r Quantity: 1 Electronic Signature(s) Signed: 11/13/2019 10:52:08 AM By: Worthy Keeler PA-C Entered By: Worthy Keeler on 11/13/2019 10:52:08

## 2019-11-24 ENCOUNTER — Other Ambulatory Visit: Payer: Self-pay | Admitting: Licensed Clinical Social Worker

## 2019-11-24 ENCOUNTER — Inpatient Hospital Stay: Payer: PPO | Attending: Internal Medicine

## 2019-11-24 ENCOUNTER — Other Ambulatory Visit: Payer: Self-pay

## 2019-11-24 DIAGNOSIS — D693 Immune thrombocytopenic purpura: Secondary | ICD-10-CM | POA: Diagnosis not present

## 2019-11-24 DIAGNOSIS — E7800 Pure hypercholesterolemia, unspecified: Secondary | ICD-10-CM | POA: Diagnosis not present

## 2019-11-24 DIAGNOSIS — Z79899 Other long term (current) drug therapy: Secondary | ICD-10-CM | POA: Diagnosis not present

## 2019-11-24 LAB — CBC WITH DIFFERENTIAL/PLATELET
Abs Immature Granulocytes: 0.01 10*3/uL (ref 0.00–0.07)
Basophils Absolute: 0 10*3/uL (ref 0.0–0.1)
Basophils Relative: 0 %
Eosinophils Absolute: 0.1 10*3/uL (ref 0.0–0.5)
Eosinophils Relative: 2 %
HCT: 42 % (ref 39.0–52.0)
Hemoglobin: 13.6 g/dL (ref 13.0–17.0)
Immature Granulocytes: 0 %
Lymphocytes Relative: 18 %
Lymphs Abs: 1.5 10*3/uL (ref 0.7–4.0)
MCH: 32.9 pg (ref 26.0–34.0)
MCHC: 32.4 g/dL (ref 30.0–36.0)
MCV: 101.4 fL — ABNORMAL HIGH (ref 80.0–100.0)
Monocytes Absolute: 0.8 10*3/uL (ref 0.1–1.0)
Monocytes Relative: 9 %
Neutro Abs: 6 10*3/uL (ref 1.7–7.7)
Neutrophils Relative %: 71 %
Platelets: 269 10*3/uL (ref 150–400)
RBC: 4.14 MIL/uL — ABNORMAL LOW (ref 4.22–5.81)
RDW: 13.2 % (ref 11.5–15.5)
WBC: 8.5 10*3/uL (ref 4.0–10.5)
nRBC: 0 % (ref 0.0–0.2)

## 2019-11-24 LAB — BASIC METABOLIC PANEL
Anion gap: 6 (ref 5–15)
BUN: 15 mg/dL (ref 8–23)
CO2: 28 mmol/L (ref 22–32)
Calcium: 8.8 mg/dL — ABNORMAL LOW (ref 8.9–10.3)
Chloride: 103 mmol/L (ref 98–111)
Creatinine, Ser: 0.85 mg/dL (ref 0.61–1.24)
GFR calc Af Amer: >60 mL/min (ref >60)
GFR calc non Af Amer: >60 mL/min (ref >60)
Glucose, Bld: 96 mg/dL (ref 70–99)
Potassium: 4.2 mmol/L (ref 3.5–5.1)
Sodium: 137 mmol/L (ref 135–145)

## 2019-11-24 LAB — LACTATE DEHYDROGENASE: LDH: 132 U/L (ref 98–192)

## 2019-11-24 NOTE — Telephone Encounter (Signed)
Checked re-enrollment status for TAF.  Decision still pending. Expected decision date is 12/12/2019.

## 2019-11-26 NOTE — Progress Notes (Addendum)
[name] ([id number]) Visit Report for 11/13/2019 Arrival Information Details Patient Name: [name], [name]. Date of Service: 11/13/2019 10:00 AM Medical Record Number: [id number] Patient Account Number: [id number] Date of Birth/Sex: [birth year] (87 y.o. M) Treating RN: Cornell Barman Primary Care Jsoeph Podesta: Derinda Late Other Clinician: Referring Mishel Sans: BABAOFF, MARCUS Treating Nzinga Ferran/Extender: Melburn Hake, HOYT Weeks in Treatment: 3 Visit Information History Since Last Visit Added or deleted any medications: No Patient Arrived: Ambulatory Any new allergies or adverse reactions: No Arrival Time: 10:13 Had a fall or experienced change in No Accompanied By: wife activities of daily living that may affect Transfer Assistance: None risk of falls: Patient Identification Verified: Yes Signs or symptoms of abuse/neglect since last visito No Secondary Verification Process Completed: Yes Hospitalized since last visit: No Implantable device outside of the clinic excluding No cellular tissue based products placed in the center since last visit: Has Dressing in Place as Prescribed: Yes Pain Present Now: No Electronic Signature(s) Signed: 11/26/2019 4:51:46 PM By: Gretta Cool, BSN, RN, CWS, Kim RN, BSN Entered By: Gretta Cool, BSN, RN, CWS, Kim on 11/13/2019 10:14:22 [name] ([id number]) -------------------------------------------------------------------------------- Clinic Level of Care Assessment Details Patient Name: [name], [name] Date of Service: 11/13/2019 10:00 AM Medical Record Number: [id number] Patient Account Number: [id number] Date of Birth/Sex: 05-31-1932 (87 y.o. M) Treating RN: Army Melia Primary Care Ervey Fallin: BABAOFF, MARCUS Other Clinician: Referring Jaeleigh Monaco: BABAOFF, MARCUS Treating Adyn Serna/Extender: STONE III, HOYT Weeks in Treatment: 3 Clinic Level of Care Assessment Items TOOL 4 Quantity Score []  - Use when only an EandM is performed on FOLLOW-UP  visit 0 ASSESSMENTS - Nursing Assessment / Reassessment X - Reassessment of Co-morbidities (includes updates in patient status) 1 10 X- 1 5 Reassessment of Adherence to Treatment Plan ASSESSMENTS - Wound and Skin Assessment / Reassessment X - Simple Wound Assessment / Reassessment - one wound 1 5 []  - 0 Complex Wound Assessment / Reassessment - multiple wounds []  - 0 Dermatologic / Skin Assessment (not related to wound area) ASSESSMENTS - Focused Assessment []  - Circumferential Edema Measurements - multi extremities 0 []  - 0 Nutritional Assessment / Counseling / Intervention []  - 0 Lower Extremity Assessment (monofilament, tuning fork, pulses) []  - 0 Peripheral Arterial Disease Assessment (using hand held doppler) ASSESSMENTS - Ostomy and/or Continence Assessment and Care []  - Incontinence Assessment and Management 0 []  - 0 Ostomy Care Assessment and Management (repouching, etc.) PROCESS - Coordination of Care X - Simple Patient / Family Education for ongoing care 1 15 []  - 0 Complex (extensive) Patient / Family Education for ongoing care []  - 0 Staff obtains Programmer, systems, Records, Test Results / Process Orders []  - 0 Staff telephones HHA, Nursing Homes / Clarify orders / etc []  - 0 Routine Transfer to another Facility (non-emergent condition) []  - 0 Routine Hospital Admission (non-emergent condition) []  - 0 New Admissions / Biomedical engineer / Ordering NPWT, Apligraf, etc. []  - 0 Emergency Hospital Admission (emergent condition) X- 1 10 Simple Discharge Coordination DAILEY, DORER. ([id number]) []  - 0 Complex (extensive) Discharge Coordination PROCESS - Special Needs []  - Pediatric / Minor Patient Management 0 []  - 0 Isolation Patient Management []  - 0 Hearing / Language / Visual special needs []  - 0 Assessment of Community assistance (transportation, D/C planning, etc.) []  - 0 Additional assistance / Altered mentation []  - 0 Support Surface(s) Assessment  (bed, cushion, seat, etc.) INTERVENTIONS - Wound Cleansing / Measurement X - Simple Wound Cleansing - one wound 1 5 []  - 0 Complex Wound Cleansing -  multiple wounds X- 1 5 Wound Imaging (photographs - any number of wounds) []  - 0 Wound Tracing (instead of photographs) X- 1 5 Simple Wound Measurement - one wound []  - 0 Complex Wound Measurement - multiple wounds INTERVENTIONS - Wound Dressings []  - Small Wound Dressing one or multiple wounds 0 X- 1 15 Medium Wound Dressing one or multiple wounds []  - 0 Large Wound Dressing one or multiple wounds []  - 0 Application of Medications - topical []  - 0 Application of Medications - injection INTERVENTIONS - Miscellaneous []  - External ear exam 0 []  - 0 Specimen Collection (cultures, biopsies, blood, body fluids, etc.) []  - 0 Specimen(s) / Culture(s) sent or taken to Lab for analysis []  - 0 Patient Transfer (multiple staff / Civil Service fast streamer / Similar devices) []  - 0 Simple Staple / Suture removal (25 or less) []  - 0 Complex Staple / Suture removal (26 or more) []  - 0 Hypo / Hyperglycemic Management (close monitor of Blood Glucose) []  - 0 Ankle / Brachial Index (ABI) - do not check if billed separately X- 1 5 Vital Signs Leoni, Semaj C. ([id number]) Has the patient been seen at the hospital within the last three years: Yes Total Score: 80 Level Of Care: New/Established - Level 3 Electronic Signature(s) Signed: 11/13/2019 12:15:27 PM By: Army Melia Entered By: Army Melia on 11/13/2019 10:47:55 [name] ([id number]) -------------------------------------------------------------------------------- Encounter Discharge Information Details Patient Name: [name]. Date of Service: 11/13/2019 10:00 AM Medical Record Number: [id number] Patient Account Number: [id number] Date of Birth/Sex: 12-13-1931 (87 y.o. M) Treating RN: Army Melia Primary Care Aaliyah Gavel: Derinda Late Other Clinician: Referring Sylvester Salonga:  BABAOFF, MARCUS Treating Emiliana Blaize/Extender: Melburn Hake, HOYT Weeks in Treatment: 3 Encounter Discharge Information Items Discharge Condition: Stable Ambulatory Status: Ambulatory Discharge Destination: Home Transportation: Private Auto Accompanied By: wife Schedule Follow-up Appointment: Yes Clinical Summary of Care: Electronic Signature(s) Signed: 11/13/2019 12:15:27 PM By: Army Melia Entered By: Army Melia on 11/13/2019 10:48:35 [name] ([id number]) -------------------------------------------------------------------------------- Lower Extremity Assessment Details Patient Name: Rosanne Gutting C. Date of Service: 11/13/2019 10:00 AM Medical Record Number: [id number] Patient Account Number: [id number] Date of Birth/Sex: Nov 15, 1932 (87 y.o. M) Treating RN: Cornell Barman Primary Care Irie Dowson: Derinda Late Other Clinician: Referring Honesty Menta: Derinda Late Treating Adeoluwa Silvers/Extender: Worthy Keeler Weeks in Treatment: 3 Electronic Signature(s) Signed: 11/26/2019 4:51:46 PM By: Gretta Cool, BSN, RN, CWS, Kim RN, BSN Entered By: Gretta Cool, BSN, RN, CWS, Kim on 11/13/2019 10:23:23 [name] ([id number]) -------------------------------------------------------------------------------- Multi Wound Chart Details Patient Name: [name]. Date of Service: 11/13/2019 10:00 AM Medical Record Number: [id number] Patient Account Number: [id number] Date of Birth/Sex: 06/11/1932 (87 y.o. M) Treating RN: Army Melia Primary Care Sruthi Maurer: BABAOFF, MARCUS Other Clinician: Referring Nastassia Bazaldua: BABAOFF, MARCUS Treating Labrittany Wechter/Extender: STONE III, HOYT Weeks in Treatment: 3 Vital Signs Height(in): 68 Pulse(bpm): 78 Weight(lbs): 173 Blood Pressure(mmHg): 117/77 Body Mass Index(BMI): 26 Temperature(F): 98.4 Respiratory Rate 16 (breaths/min): Photos: [N/A:N/A] Wound Location: Right Forearm N/A N/A Wounding Event: Trauma N/A N/A Primary Etiology: Skin Tear N/A  N/A Comorbid History: Anemia, Hypertension N/A N/A Date Acquired: 10/22/2019 N/A N/A Weeks of Treatment: 3 N/A N/A Wound Status: Open N/A N/A Measurements L x W x D 0.2x0.2x0.1 N/A N/A (cm) Area (cm) : 0.031 N/A N/A Volume (cm) : 0.003 N/A N/A % Reduction in Area: 99.70% N/A N/A % Reduction in Volume: 99.70% N/A N/A Classification: Full Thickness Without N/A N/A Exposed Support Structures Exudate Amount: Medium N/A N/A Exudate Type: Sanguinous N/A N/A Exudate Color: red N/A  N/A Wound Margin: Flat and Intact N/A N/A Granulation Amount: Large (67-100%) N/A N/A Granulation Quality: Red N/A N/A Necrotic Amount: None Present (0%) N/A N/A Exposed Structures: Fat Layer (Subcutaneous N/A N/A Tissue) Exposed: Yes Fascia: No Tendon: No Muscle: No Joint: No Bone: No Scorza, Kenton C. ([id number]) Epithelialization: None N/A N/A Treatment Notes Electronic Signature(s) Signed: 11/13/2019 12:15:27 PM By: Army Melia Entered By: Army Melia on 11/13/2019 10:46:25 [name] ([id number]) -------------------------------------------------------------------------------- Multi-Disciplinary Care Plan Details Patient Name: [name]. Date of Service: 11/13/2019 10:00 AM Medical Record Number: [id number] Patient Account Number: [id number] Date of Birth/Sex: 24-Jun-1932 (87 y.o. M) Treating RN: Army Melia Primary Care Sabryna Lahm: Derinda Late Other Clinician: Referring Electa Sterry: Derinda Late Treating Claus Silvestro/Extender: Melburn Hake, HOYT Weeks in Treatment: 3 Active Inactive Electronic Signature(s) Signed: 12/16/2019 6:08:00 PM By: Gretta Cool, BSN, RN, CWS, Kim RN, BSN Signed: 01/09/2020 12:35:35 PM By: Army Melia Previous Signature: 11/13/2019 12:15:27 PM Version By: Army Melia Entered By: Gretta Cool BSN, RN, CWS, Kim on 12/16/2019 18:08:00 MOSA, HILLIER ([id number]) -------------------------------------------------------------------------------- Pain Assessment  Details Patient Name: FYODOR, KRUZAN. Date of Service: 11/13/2019 10:00 AM Medical Record Number: [id number] Patient Account Number: [id number] Date of Birth/Sex: [birth year] (87 y.o. M) Treating RN: Cornell Barman Primary Care Tassie Pollett: Derinda Late Other Clinician: Referring Celeste Tavenner: BABAOFF, MARCUS Treating Loreley Schwall/Extender: STONE III, HOYT Weeks in Treatment: 3 Active Problems Location of Pain Severity and Description of Pain Patient Has Paino No Site Locations Pain Management and Medication Current Pain Management: Notes Patient denies pain at this time. Electronic Signature(s) Signed: 11/26/2019 4:51:46 PM By: Gretta Cool, BSN, RN, CWS, Kim RN, BSN Entered By: Gretta Cool, BSN, RN, CWS, Kim on 11/13/2019 10:14:35 [name] ([id number]) -------------------------------------------------------------------------------- Patient/Caregiver Education Details Patient Name: AYDIEN, SUDBECK. Date of Service: 11/13/2019 10:00 AM Medical Record Number: [id number] Patient Account Number: [id number] Date of Birth/Gender: Apr 03, 1932 (86 y.o. M) Treating RN: Army Melia Primary Care Physician: Derinda Late Other Clinician: Referring Physician: BABAOFF, MARCUS Treating Physician/Extender: Melburn Hake, HOYT Weeks in Treatment: 3 Education Assessment Education Provided To: Patient Education Topics Provided Wound/Skin Impairment: Handouts: Caring for Your Ulcer Methods: Demonstration, Explain/Verbal Responses: State content correctly Electronic Signature(s) Signed: 11/13/2019 12:15:27 PM By: Army Melia Entered By: Army Melia on 11/13/2019 10:48:09 [name] ([id number]) -------------------------------------------------------------------------------- Wound Assessment Details Patient Name: Rosanne Gutting C. Date of Service: 11/13/2019 10:00 AM Medical Record Number: [id number] Patient Account Number: [id number] Date of Birth/Sex: 1932-07-26 (87 y.o. M) Treating RN: Cornell Barman Primary Care Mychelle Kendra: Derinda Late Other Clinician: Referring Adylene Dlugosz: BABAOFF, MARCUS Treating Kedarius Aloisi/Extender: STONE III, HOYT Weeks in Treatment: 3 Wound Status Wound Number: 1 Primary Etiology: Skin Tear Wound Location: Right Forearm Wound Status: Open Wounding Event: Trauma Comorbid History: Anemia, Hypertension Date Acquired: 10/22/2019 Weeks Of Treatment: 3 Clustered Wound: No Photos Wound Measurements Length: (cm) 0.2 Width: (cm) 0.2 Depth: (cm) 0.1 Area: (cm) 0.031 Volume: (cm) 0.003 % Reduction in Area: 99.7% % Reduction in Volume: 99.7% Epithelialization: None Tunneling: No Undermining: No Wound Description Full Thickness Without Exposed Support Foul Od Classification: Structures Slough/ Wound Margin: Flat and Intact Exudate Medium Amount: Exudate Type: Sanguinous Exudate Color: red or After Cleansing: No Fibrino No Wound Bed Granulation Amount: Large (67-100%) Exposed Structure Granulation Quality: Red Fascia Exposed: No Necrotic Amount: None Present (0%) Fat Layer (Subcutaneous Tissue) Exposed: Yes Tendon Exposed: No Muscle Exposed: No Joint Exposed: No Bone Exposed: No TED, BARGERON ([id number]) Electronic Signature(s) Signed: 11/26/2019 4:51:46 PM By: Gretta Cool, BSN, RN, CWS, Kim RN, BSN Entered By: Gretta Cool, BSN,  RN, CWS, Kim on 11/13/2019 10:22:00 JAMAN, ZERBE ([id number]) -------------------------------------------------------------------------------- Vitals Details Patient Name: ALTIN, BRISCO. Date of Service: 11/13/2019 10:00 AM Medical Record Number: [id number] Patient Account Number: [id number] Date of Birth/Sex: 1932/06/16 (87 y.o. M) Treating RN: Cornell Barman Primary Care Delwyn Scoggin: BABAOFF, MARCUS Other Clinician: Referring Reigna Ruperto: BABAOFF, MARCUS Treating Gail Vendetti/Extender: STONE III, HOYT Weeks in Treatment: 3 Vital Signs Time Taken: 10:14 Temperature (F): 98.4 Height (in): 68 Pulse (bpm): 78 Weight (lbs):  173 Respiratory Rate (breaths/min): 16 Body Mass Index (BMI): 26.3 Blood Pressure (mmHg): 117/77 Reference Range: 80 - 120 mg / dl Electronic Signature(s) Signed: 11/26/2019 4:51:46 PM By: Gretta Cool, BSN, RN, CWS, Kim RN, BSN Entered By: Gretta Cool, BSN, RN, CWS, Kim on 11/13/2019 10:21:06

## 2019-11-27 ENCOUNTER — Ambulatory Visit: Payer: PPO | Admitting: Physician Assistant

## 2019-12-05 ENCOUNTER — Other Ambulatory Visit: Payer: Self-pay

## 2019-12-05 ENCOUNTER — Encounter: Payer: Self-pay | Admitting: Internal Medicine

## 2019-12-05 ENCOUNTER — Other Ambulatory Visit: Payer: Self-pay | Admitting: *Deleted

## 2019-12-05 DIAGNOSIS — D693 Immune thrombocytopenic purpura: Secondary | ICD-10-CM

## 2019-12-08 ENCOUNTER — Other Ambulatory Visit: Payer: Self-pay | Admitting: Internal Medicine

## 2019-12-08 ENCOUNTER — Inpatient Hospital Stay: Payer: PPO

## 2019-12-08 ENCOUNTER — Inpatient Hospital Stay (HOSPITAL_BASED_OUTPATIENT_CLINIC_OR_DEPARTMENT_OTHER): Payer: PPO | Admitting: Internal Medicine

## 2019-12-08 ENCOUNTER — Other Ambulatory Visit: Payer: Self-pay

## 2019-12-08 DIAGNOSIS — D693 Immune thrombocytopenic purpura: Secondary | ICD-10-CM | POA: Diagnosis not present

## 2019-12-08 LAB — CBC WITH DIFFERENTIAL/PLATELET
Abs Immature Granulocytes: 0.02 10*3/uL (ref 0.00–0.07)
Basophils Absolute: 0 10*3/uL (ref 0.0–0.1)
Basophils Relative: 1 %
Eosinophils Absolute: 0.2 10*3/uL (ref 0.0–0.5)
Eosinophils Relative: 2 %
HCT: 42.7 % (ref 39.0–52.0)
Hemoglobin: 13.7 g/dL (ref 13.0–17.0)
Immature Granulocytes: 0 %
Lymphocytes Relative: 20 %
Lymphs Abs: 1.5 10*3/uL (ref 0.7–4.0)
MCH: 32.6 pg (ref 26.0–34.0)
MCHC: 32.1 g/dL (ref 30.0–36.0)
MCV: 101.7 fL — ABNORMAL HIGH (ref 80.0–100.0)
Monocytes Absolute: 0.6 10*3/uL (ref 0.1–1.0)
Monocytes Relative: 8 %
Neutro Abs: 5.2 10*3/uL (ref 1.7–7.7)
Neutrophils Relative %: 69 %
Platelets: 353 10*3/uL (ref 150–400)
RBC: 4.2 MIL/uL — ABNORMAL LOW (ref 4.22–5.81)
RDW: 13.2 % (ref 11.5–15.5)
WBC: 7.5 10*3/uL (ref 4.0–10.5)
nRBC: 0 % (ref 0.0–0.2)

## 2019-12-08 LAB — COMPREHENSIVE METABOLIC PANEL
ALT: 16 U/L (ref 0–44)
AST: 21 U/L (ref 15–41)
Albumin: 3.8 g/dL (ref 3.5–5.0)
Alkaline Phosphatase: 57 U/L (ref 38–126)
Anion gap: 9 (ref 5–15)
BUN: 15 mg/dL (ref 8–23)
CO2: 26 mmol/L (ref 22–32)
Calcium: 8.7 mg/dL — ABNORMAL LOW (ref 8.9–10.3)
Chloride: 101 mmol/L (ref 98–111)
Creatinine, Ser: 0.98 mg/dL (ref 0.61–1.24)
GFR calc Af Amer: >60 mL/min (ref >60)
GFR calc non Af Amer: >60 mL/min (ref >60)
Glucose, Bld: 119 mg/dL — ABNORMAL HIGH (ref 70–99)
Potassium: 4 mmol/L (ref 3.5–5.1)
Sodium: 136 mmol/L (ref 135–145)
Total Bilirubin: 0.6 mg/dL (ref 0.3–1.2)
Total Protein: 6.9 g/dL (ref 6.5–8.1)

## 2019-12-08 NOTE — Progress Notes (Signed)
[name] OFFICE PROGRESS NOTE  Patient Care Team: Derinda Late, MD as PCP - General (Family Medicine)   SUMMARY OF ONCOLOGIC HISTORY:  #FEB 2016-  ITP  s/p Prednisone; OCT 2016- Relapse of ITP- Prednisone; JAN 13th- START RITUXAN q W x4 [finished Feb 8th]; March 10th 2017- N-plate q W- good response; Dec 2018- promacta 25 mg qOD.   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   # Hx of nose bleeds  INTERVAL HISTORY:  A very pleasant 86 year old male patient with a history of autoimmune hemolytic anemia/ITP is here for follow-up.   Patient denies any easy bruising.  Denies any nosebleeds.  No weight loss.  Appetite is good.  Continues to be on CellCept and Promacta.  Patient had Covid vaccine approximately week ago.  Is awaiting repeat vaccine next week.  Review of Systems  Constitutional: Negative for chills, diaphoresis, fever, malaise/fatigue and weight loss.  HENT: Negative for nosebleeds and sore throat.   Eyes: Negative for double vision.  Respiratory: Negative for cough, hemoptysis, sputum production, shortness of breath and wheezing.   Cardiovascular: Negative for chest pain, palpitations, orthopnea and leg swelling.  Gastrointestinal: Negative for abdominal pain, blood in stool, constipation, diarrhea, heartburn, melena, nausea and vomiting.  Genitourinary: Negative for dysuria, frequency and urgency.  Musculoskeletal: Negative for back pain and joint pain.  Skin: Negative.  Negative for itching and rash.  Neurological: Negative for dizziness, tingling, focal weakness, weakness and headaches.  Endo/Heme/Allergies: Does not bruise/bleed easily.  Psychiatric/Behavioral: Negative for depression. The patient is not nervous/anxious and does not have insomnia.     PAST MEDICAL HISTORY :  Past Medical History:  Diagnosis Date  . Autoimmune hemolytic anemia   . Dehydration   . Detached retina    . Hypercholesteremia   . Hypertension   . Insomnia   . 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 Leukocytosis   . Prostate cancer ([place])   . Shingles     PAST SURGICAL HISTORY :   Past Surgical History:  Procedure Laterality Date  . artificial left eye    . EYE SURGERY    . HERNIA REPAIR    . JOINT REPLACEMENT    . PROSTATE SURGERY      FAMILY HISTORY :   Family History  Problem Relation Age of Onset  . Cancer Mother   . Multiple sclerosis Father     SOCIAL HISTORY:   Social History   Tobacco Use  . Smoking status: Never Smoker  Substance Use Topics  . Alcohol use: No  . Drug use: No    ALLERGIES:  is allergic to cephalexin and hydrocodone-acetaminophen.  MEDICATIONS:  Current Outpatient Medications  Medication Sig Dispense Refill  . acetaminophen (TYLENOL) 500 MG tablet Take 500 mg by mouth every 6 (six) hours as needed for mild pain. Reported on 01/24/2016    . citalopram (CELEXA) 10 MG tablet Take 10 mg by mouth daily.    . Cyanocobalamin (RA VITAMIN B-12 TR) 1000 MCG TBCR Take 1,000 mcg by mouth daily.    . feeding supplement, ENSURE ENLIVE, (ENSURE ENLIVE) LIQD Take 237 mLs by mouth 2 (two) times daily between meals. 237 mL 12  . gabapentin (NEURONTIN) 100 MG capsule Take 100 mg by mouth at bedtime. Reported on 12/03/2015    . lisinopril (PRINIVIL,ZESTRIL) 5 MG tablet Take 10 mg by mouth daily.     . meclizine (ANTIVERT) 25 MG tablet Take 1  tablet by mouth as needed for dizziness.    . mycophenolate (CELLCEPT) 500 MG tablet TAKE TWO TABLETS EVERY MORNING AND TAKE ONE TABLET EVERY EVENING 90 tablet 3  . omeprazole (PRILOSEC) 40 MG capsule TAKE 1 CAPSULE BY MOUTH DAILY USUALLY 30 MINUTES BEFORE BREAKFAST 90 capsule 2  . polyethylene glycol (MIRALAX / GLYCOLAX) packet Take 17 g by mouth daily as needed for mild constipation. 14 each 0  . polyvinyl alcohol (LIQUIFILM TEARS) 1.4 % ophthalmic solution Place 2 drops into the left eye 3 (three) times daily  as needed for dry eyes. 15 mL 0  . pravastatin (PRAVACHOL) 20 MG tablet Take 20 mg by mouth at bedtime.     Marland Kitchen PROMACTA 25 MG tablet TAKE 1 TABLET (25 MG TOTAL) BY MOUTH EVERY OTHER DAY. TAKE ON AN EMPTY STOMACH, 1 HOUR BEFORE A MEAL OR 2 HOURS AFTER. 15 tablet 4   No current facility-administered medications for this visit.    PHYSICAL EXAMINATION:   BP (!) 146/82 (BP Location: Left Arm, Patient Position: Sitting, Cuff Size: Large)   Pulse 60   Temp 97.7 F (36.5 C) (Tympanic)   Wt 171 lb (77.6 kg)   BMI 27.35 kg/m   Filed Weights   12/05/19 1500  Weight: 171 lb (77.6 kg)    Physical Exam  Constitutional: He is oriented to person, place, and time and well-developed, well-nourished, and in no distress.  HENT:  Head: Normocephalic and atraumatic.  Mouth/Throat: Oropharynx is clear and moist. No oropharyngeal exudate.  Eyes: Pupils are equal, round, and reactive to light.  Cardiovascular: Normal rate and regular rhythm.  Pulmonary/Chest: No respiratory distress. He has no wheezes.  Abdominal: Soft. Bowel sounds are normal. He exhibits no distension and no mass. There is no abdominal tenderness. There is no rebound and no guarding.  Musculoskeletal:        General: No tenderness or edema. Normal range of motion.     Cervical back: Normal range of motion and neck supple.  Neurological: He is alert and oriented to person, place, and time.  Skin: Skin is warm.  Psychiatric: Affect normal.     LABORATORY DATA:  I have reviewed the data as listed    Component Value Date/Time   NA 136 12/08/2019 1409   NA 131 (L) 03/17/2015 1425   K 4.0 12/08/2019 1409   K 3.9 03/17/2015 1425   CL 101 12/08/2019 1409   CL 101 03/17/2015 1425   CO2 26 12/08/2019 1409   CO2 26 03/17/2015 1425   GLUCOSE 119 (H) 12/08/2019 1409   GLUCOSE 161 (H) 03/17/2015 1425   BUN 15 12/08/2019 1409   BUN 17 03/17/2015 1425   CREATININE 0.98 12/08/2019 1409   CREATININE 0.90 03/17/2015 1425   CALCIUM  8.7 (L) 12/08/2019 1409   CALCIUM 8.5 (L) 03/17/2015 1425   PROT 6.9 12/08/2019 1409   PROT 6.7 03/17/2015 1425   ALBUMIN 3.8 12/08/2019 1409   ALBUMIN 3.7 03/17/2015 1425   AST 21 12/08/2019 1409   AST 25 03/17/2015 1425   ALT 16 12/08/2019 1409   ALT 23 03/17/2015 1425   ALKPHOS 57 12/08/2019 1409   ALKPHOS 44 03/17/2015 1425   BILITOT 0.6 12/08/2019 1409   BILITOT 0.6 03/17/2015 1425   GFRNONAA >60 12/08/2019 1409   GFRNONAA >60 03/17/2015 1425   GFRAA >60 12/08/2019 1409   GFRAA >60 03/17/2015 1425    No results found for: SPEP, UPEP  Lab Results  Component Value Date  WBC 7.5 12/08/2019   NEUTROABS 5.2 12/08/2019   HGB 13.7 12/08/2019   HCT 42.7 12/08/2019   MCV 101.7 (H) 12/08/2019   PLT 353 12/08/2019      Chemistry      Component Value Date/Time   NA 136 12/08/2019 1409   NA 131 (L) 03/17/2015 1425   K 4.0 12/08/2019 1409   K 3.9 03/17/2015 1425   CL 101 12/08/2019 1409   CL 101 03/17/2015 1425   CO2 26 12/08/2019 1409   CO2 26 03/17/2015 1425   BUN 15 12/08/2019 1409   BUN 17 03/17/2015 1425   CREATININE 0.98 12/08/2019 1409   CREATININE 0.90 03/17/2015 1425      Component Value Date/Time   CALCIUM 8.7 (L) 12/08/2019 1409   CALCIUM 8.5 (L) 03/17/2015 1425   ALKPHOS 57 12/08/2019 1409   ALKPHOS 44 03/17/2015 1425   AST 21 12/08/2019 1409   AST 25 03/17/2015 1425   ALT 16 12/08/2019 1409   ALT 23 03/17/2015 1425   BILITOT 0.6 12/08/2019 1409   BILITOT 0.6 03/17/2015 1425       ASSESSMENT & PLAN:   Idiopathic thrombocytopenic purpura ([facility]) #Chronic ITP currently on  Promacta 25 mg every other day.  Platelets 353  #Hemolytic anemia hemoglobin 13/LDH normal.  Continue CellCept 1000 milligrams in the morning 500 mg at nigh-stable.  # Hypocalcemia- 8.7; recommend vit D 1000 unit/day.   #Patient s/p Covid vaccine.  # DISPOSITION:labs print out # labs q 4 weeks- cbc;  # follow up in 4 months/labs-cbc/cmp- Dr.B      [name],  MD 12/09/2019 7:51 AM

## 2019-12-08 NOTE — Assessment & Plan Note (Addendum)
#  Chronic ITP currently on  Promacta 25 mg every other day.  Platelets 353  #Hemolytic anemia hemoglobin 13/LDH normal.  Continue CellCept 1000 milligrams in the morning 500 mg at nigh-stable.  # Hypocalcemia- 8.7; recommend vit D 1000 unit/day.   #Patient s/p Covid vaccine.  # DISPOSITION:labs print out # labs q 4 weeks- cbc;  # follow up in 4 months/labs-cbc/cmp- Dr.B

## 2019-12-08 NOTE — Telephone Encounter (Signed)
Contains abnormal data CBC with Differential Order: [id number] Status:  Final result  Visible to patient:  Yes (MyChart)  Next appt:  Today at 02:00 PM in Oncology ([facility])  Dx:  Idiopathic thrombocytopenic purpura (...  Ref Range & Units 2 wk ago  WBC 4.0 - 10.5 K/uL 8.5   RBC 4.22 - 5.81 MIL/uL 4.14Low    Hemoglobin 13.0 - 17.0 g/dL 13.6   HCT 39.0 - 52.0 % 42.0   MCV 80.0 - 100.0 fL 101.4High    MCH 26.0 - 34.0 pg 32.9   MCHC 30.0 - 36.0 g/dL 32.4   RDW 11.5 - 15.5 % 13.2   Platelets 150 - 400 K/uL 269   nRBC 0.0 - 0.2 % 0.0   Neutrophils Relative % % 71   Neutro Abs 1.7 - 7.7 K/uL 6.0   Lymphocytes Relative % 18   Lymphs Abs 0.7 - 4.0 K/uL 1.5   Monocytes Relative % 9   Monocytes Absolute 0.1 - 1.0 K/uL 0.8   Eosinophils Relative % 2   Eosinophils Absolute 0.0 - 0.5 K/uL 0.1   Basophils Relative % 0   Basophils Absolute 0.0 - 0.1 K/uL 0.0   Immature Granulocytes % 0   Abs Immature Granulocytes 0.00 - 0.07 K/uL 0.01   Comment: Performed at [facility], [address], [place], [place] [address]  Resulting Agency  [facility]IN LAB      Specimen Collected: 11/24/19 14:52  Last Resulted: 11/24/19 14:58     Lab Flowsheet   Order Details   View Encounter   Lab and Collection Details   Routing   Result History         Other Results from 11/24/2019  Lactate dehydrogenase  Status:  Final result  Visible to patient:  Yes (MyChart)  Next appt:  Today at 02:00 PM in Oncology ([facility])  Dx:  Idiopathic thrombocytopenic purpura (... Order: [id number]  Ref Range & Units 2 wk ago  LDH 98 - 192 U/L 132   Comment: Performed at [facility], [address] [place], [place] [address]  Resulting Agency  [facility] CLIN LAB      Specimen Collected: 11/24/19 14:52  Last Resulted: 11/24/19 15:07     Lab Flowsheet   Order Details   View Encounter   Lab and Collection Details   Routing   Result History           Contains abnormal data Basic  metabolic panel  Status:  Final result  Visible to patient:  Yes (MyChart)  Next appt:  Today at 02:00 PM in Oncology ([facility])  Dx:  Idiopathic thrombocytopenic purpura (... Order: [id number]  Ref Range & Units 2 wk ago  Sodium 135 - 145 mmol/L 137   Potassium 3.5 - 5.1 mmol/L 4.2   Chloride 98 - 111 mmol/L 103   CO2 22 - 32 mmol/L 28   Glucose, Bld 70 - 99 mg/dL 96   BUN 8 - 23 mg/dL 15   Creatinine, Ser 0.61 - 1.24 mg/dL 0.85   Calcium 8.9 - 10.3 mg/dL 8.8Low    GFR calc non Af Amer >60 mL/min >60   GFR calc Af Amer >60 mL/min >60   Anion gap 5 - 15 6   Comment: Performed at [facility], [address], [place], [place] [address]  Resulting Agency  [facility] CLIN LAB      Specimen Collected: 11/24/19 14:52  Last Resulted: 11/24/19 15:07

## 2019-12-18 NOTE — Telephone Encounter (Signed)
Oral Oncology Patient Advocate Encounter  Checked status of TAF renewal online.  Patient was not re-enrolled in TAF due to grant fund not being open.    I called and spoke to the patient and his wife to explain why he wasn't re-enrolled like years before.  Patient has about 1 month of medication on hand.  We will apply to [employer] Patient [employer] to help patient afford Promacta.    Copay is currently $909.21.  [place] Patient [place] Phone [phone] Fax [phone] 12/18/2019 3:51 PM

## 2019-12-30 DIAGNOSIS — H40051 Ocular hypertension, right eye: Secondary | ICD-10-CM | POA: Diagnosis not present

## 2020-01-05 ENCOUNTER — Inpatient Hospital Stay: Payer: PPO | Attending: Internal Medicine

## 2020-01-05 ENCOUNTER — Other Ambulatory Visit: Payer: Self-pay

## 2020-01-05 DIAGNOSIS — D589 Hereditary hemolytic anemia, unspecified: Secondary | ICD-10-CM | POA: Insufficient documentation

## 2020-01-05 DIAGNOSIS — D693 Immune thrombocytopenic purpura: Secondary | ICD-10-CM | POA: Diagnosis not present

## 2020-01-05 LAB — CBC WITH DIFFERENTIAL/PLATELET
Abs Immature Granulocytes: 0.02 10*3/uL (ref 0.00–0.07)
Basophils Absolute: 0 10*3/uL (ref 0.0–0.1)
Basophils Relative: 0 %
Eosinophils Absolute: 0.2 10*3/uL (ref 0.0–0.5)
Eosinophils Relative: 3 %
HCT: 40.8 % (ref 39.0–52.0)
Hemoglobin: 13.3 g/dL (ref 13.0–17.0)
Immature Granulocytes: 0 %
Lymphocytes Relative: 23 %
Lymphs Abs: 1.7 10*3/uL (ref 0.7–4.0)
MCH: 33.3 pg (ref 26.0–34.0)
MCHC: 32.6 g/dL (ref 30.0–36.0)
MCV: 102 fL — ABNORMAL HIGH (ref 80.0–100.0)
Monocytes Absolute: 0.7 10*3/uL (ref 0.1–1.0)
Monocytes Relative: 9 %
Neutro Abs: 4.7 10*3/uL (ref 1.7–7.7)
Neutrophils Relative %: 65 %
Platelets: 325 10*3/uL (ref 150–400)
RBC: 4 MIL/uL — ABNORMAL LOW (ref 4.22–5.81)
RDW: 13.1 % (ref 11.5–15.5)
WBC: 7.3 10*3/uL (ref 4.0–10.5)
nRBC: 0 % (ref 0.0–0.2)

## 2020-01-06 ENCOUNTER — Telehealth: Payer: Self-pay | Admitting: Pharmacy Technician

## 2020-01-06 DIAGNOSIS — R55 Syncope and collapse: Secondary | ICD-10-CM | POA: Diagnosis not present

## 2020-01-06 DIAGNOSIS — D693 Immune thrombocytopenic purpura: Secondary | ICD-10-CM | POA: Diagnosis not present

## 2020-01-06 DIAGNOSIS — I10 Essential (primary) hypertension: Secondary | ICD-10-CM | POA: Diagnosis not present

## 2020-01-12 NOTE — Telephone Encounter (Signed)
Oral Oncology Patient Advocate Encounter  12/16/19:  Called and spoke with patient and wife, [name], about high copay for Promacta and not being re-enrolled in grant assistance due to lack of funding.  I put the patient on wait list and told them we could apply for manufacturer assistance to help reduce the out of pocket cost.   Patient had a month supply on hand.  Completed patient portion of application online with patients consent for Patient Assistance [facility]) on 01/06/2020.    [place] Provider form on 01/07/2020 to [facility]. Fax number [phone].  [facility] referred patient to [employer] Patient [employer].  Application forwarded to [employer] Patient [place] in an effort to reduce patient's out of pocket expense for Promacta to $0.    This encounter will be updated until final determination.   [place] Patient [name] Phone [phone] Fax [phone] 01/12/2020 9:44 AM

## 2020-01-12 NOTE — Telephone Encounter (Signed)
Oral Oncology Patient Advocate Encounter  Received notification from [facility] that patient has been successfully enrolled into their program to receive Promacta from the manufacturer at $0 out of pocket until 11/19/2020.    I called and spoke with patient.  He knows we will have to re-apply.   Patient knows to call the office with questions or concerns.   Oral Oncology Clinic will continue to follow.  [place] Patient [place] Phone [phone] Fax [phone] 01/12/2020 10:00 AM

## 2020-02-02 ENCOUNTER — Inpatient Hospital Stay: Payer: PPO | Attending: Internal Medicine | Admitting: *Deleted

## 2020-02-02 DIAGNOSIS — D589 Hereditary hemolytic anemia, unspecified: Secondary | ICD-10-CM | POA: Diagnosis not present

## 2020-02-02 DIAGNOSIS — D693 Immune thrombocytopenic purpura: Secondary | ICD-10-CM | POA: Insufficient documentation

## 2020-02-02 DIAGNOSIS — Z95828 Presence of other vascular implants and grafts: Secondary | ICD-10-CM

## 2020-02-02 LAB — CBC WITH DIFFERENTIAL/PLATELET
Abs Immature Granulocytes: 0.05 10*3/uL (ref 0.00–0.07)
Basophils Absolute: 0 10*3/uL (ref 0.0–0.1)
Basophils Relative: 0 %
Eosinophils Absolute: 0.2 10*3/uL (ref 0.0–0.5)
Eosinophils Relative: 2 %
HCT: 42.3 % (ref 39.0–52.0)
Hemoglobin: 14.1 g/dL (ref 13.0–17.0)
Immature Granulocytes: 1 %
Lymphocytes Relative: 20 %
Lymphs Abs: 1.6 10*3/uL (ref 0.7–4.0)
MCH: 33 pg (ref 26.0–34.0)
MCHC: 33.3 g/dL (ref 30.0–36.0)
MCV: 99.1 fL (ref 80.0–100.0)
Monocytes Absolute: 0.6 10*3/uL (ref 0.1–1.0)
Monocytes Relative: 7 %
Neutro Abs: 5.7 10*3/uL (ref 1.7–7.7)
Neutrophils Relative %: 70 %
Platelets: 316 10*3/uL (ref 150–400)
RBC: 4.27 MIL/uL (ref 4.22–5.81)
RDW: 13 % (ref 11.5–15.5)
WBC: 8.1 10*3/uL (ref 4.0–10.5)
nRBC: 0 % (ref 0.0–0.2)

## 2020-03-01 ENCOUNTER — Inpatient Hospital Stay: Payer: PPO | Attending: Internal Medicine

## 2020-03-01 ENCOUNTER — Other Ambulatory Visit: Payer: Self-pay

## 2020-03-01 DIAGNOSIS — D693 Immune thrombocytopenic purpura: Secondary | ICD-10-CM | POA: Insufficient documentation

## 2020-03-01 LAB — COMPREHENSIVE METABOLIC PANEL
ALT: 17 U/L (ref 0–44)
AST: 21 U/L (ref 15–41)
Albumin: 3.9 g/dL (ref 3.5–5.0)
Alkaline Phosphatase: 52 U/L (ref 38–126)
Anion gap: 7 (ref 5–15)
BUN: 17 mg/dL (ref 8–23)
CO2: 25 mmol/L (ref 22–32)
Calcium: 8.8 mg/dL — ABNORMAL LOW (ref 8.9–10.3)
Chloride: 104 mmol/L (ref 98–111)
Creatinine, Ser: 0.98 mg/dL (ref 0.61–1.24)
GFR calc Af Amer: >60 mL/min (ref >60)
GFR calc non Af Amer: >60 mL/min (ref >60)
Glucose, Bld: 124 mg/dL — ABNORMAL HIGH (ref 70–99)
Potassium: 4.2 mmol/L (ref 3.5–5.1)
Sodium: 136 mmol/L (ref 135–145)
Total Bilirubin: 0.7 mg/dL (ref 0.3–1.2)
Total Protein: 6.5 g/dL (ref 6.5–8.1)

## 2020-03-01 LAB — CBC WITH DIFFERENTIAL/PLATELET
Abs Immature Granulocytes: 0.02 10*3/uL (ref 0.00–0.07)
Basophils Absolute: 0 10*3/uL (ref 0.0–0.1)
Basophils Relative: 0 %
Eosinophils Absolute: 0.2 10*3/uL (ref 0.0–0.5)
Eosinophils Relative: 3 %
HCT: 39.8 % (ref 39.0–52.0)
Hemoglobin: 13.6 g/dL (ref 13.0–17.0)
Immature Granulocytes: 0 %
Lymphocytes Relative: 23 %
Lymphs Abs: 1.7 10*3/uL (ref 0.7–4.0)
MCH: 33.3 pg (ref 26.0–34.0)
MCHC: 34.2 g/dL (ref 30.0–36.0)
MCV: 97.3 fL (ref 80.0–100.0)
Monocytes Absolute: 0.7 10*3/uL (ref 0.1–1.0)
Monocytes Relative: 9 %
Neutro Abs: 4.8 10*3/uL (ref 1.7–7.7)
Neutrophils Relative %: 65 %
Platelets: 291 10*3/uL (ref 150–400)
RBC: 4.09 MIL/uL — ABNORMAL LOW (ref 4.22–5.81)
RDW: 13 % (ref 11.5–15.5)
WBC: 7.4 10*3/uL (ref 4.0–10.5)
nRBC: 0 % (ref 0.0–0.2)

## 2020-04-02 ENCOUNTER — Other Ambulatory Visit: Payer: Self-pay | Admitting: *Deleted

## 2020-04-02 DIAGNOSIS — D693 Immune thrombocytopenic purpura: Secondary | ICD-10-CM

## 2020-04-05 ENCOUNTER — Inpatient Hospital Stay (HOSPITAL_BASED_OUTPATIENT_CLINIC_OR_DEPARTMENT_OTHER): Payer: PPO | Admitting: Internal Medicine

## 2020-04-05 ENCOUNTER — Inpatient Hospital Stay: Payer: PPO | Attending: Internal Medicine

## 2020-04-05 ENCOUNTER — Other Ambulatory Visit: Payer: Self-pay

## 2020-04-05 ENCOUNTER — Encounter: Payer: Self-pay | Admitting: Internal Medicine

## 2020-04-05 VITALS — BP 121/73 | HR 61 | Temp 95.0°F | Wt 173.0 lb

## 2020-04-05 DIAGNOSIS — D591 Autoimmune hemolytic anemia, unspecified: Secondary | ICD-10-CM | POA: Diagnosis not present

## 2020-04-05 DIAGNOSIS — Z79899 Other long term (current) drug therapy: Secondary | ICD-10-CM | POA: Diagnosis not present

## 2020-04-05 DIAGNOSIS — E7800 Pure hypercholesterolemia, unspecified: Secondary | ICD-10-CM | POA: Insufficient documentation

## 2020-04-05 DIAGNOSIS — D693 Immune thrombocytopenic purpura: Secondary | ICD-10-CM | POA: Diagnosis not present

## 2020-04-05 DIAGNOSIS — Z809 Family history of malignant neoplasm, unspecified: Secondary | ICD-10-CM | POA: Diagnosis not present

## 2020-04-05 DIAGNOSIS — Z8546 Personal history of malignant neoplasm of prostate: Secondary | ICD-10-CM | POA: Diagnosis not present

## 2020-04-05 DIAGNOSIS — I10 Essential (primary) hypertension: Secondary | ICD-10-CM | POA: Insufficient documentation

## 2020-04-05 LAB — CBC WITH DIFFERENTIAL/PLATELET
Abs Immature Granulocytes: 0.02 10*3/uL (ref 0.00–0.07)
Basophils Absolute: 0 10*3/uL (ref 0.0–0.1)
Basophils Relative: 0 %
Eosinophils Absolute: 0.3 10*3/uL (ref 0.0–0.5)
Eosinophils Relative: 3 %
HCT: 39.2 % (ref 39.0–52.0)
Hemoglobin: 13.3 g/dL (ref 13.0–17.0)
Immature Granulocytes: 0 %
Lymphocytes Relative: 19 %
Lymphs Abs: 1.6 10*3/uL (ref 0.7–4.0)
MCH: 33.8 pg (ref 26.0–34.0)
MCHC: 33.9 g/dL (ref 30.0–36.0)
MCV: 99.5 fL (ref 80.0–100.0)
Monocytes Absolute: 0.7 10*3/uL (ref 0.1–1.0)
Monocytes Relative: 9 %
Neutro Abs: 5.5 10*3/uL (ref 1.7–7.7)
Neutrophils Relative %: 69 %
Platelets: 315 10*3/uL (ref 150–400)
RBC: 3.94 MIL/uL — ABNORMAL LOW (ref 4.22–5.81)
RDW: 13.2 % (ref 11.5–15.5)
WBC: 8 10*3/uL (ref 4.0–10.5)
nRBC: 0 % (ref 0.0–0.2)

## 2020-04-05 LAB — COMPREHENSIVE METABOLIC PANEL
ALT: 18 U/L (ref 0–44)
AST: 20 U/L (ref 15–41)
Albumin: 3.7 g/dL (ref 3.5–5.0)
Alkaline Phosphatase: 55 U/L (ref 38–126)
Anion gap: 7 (ref 5–15)
BUN: 15 mg/dL (ref 8–23)
CO2: 27 mmol/L (ref 22–32)
Calcium: 8.6 mg/dL — ABNORMAL LOW (ref 8.9–10.3)
Chloride: 101 mmol/L (ref 98–111)
Creatinine, Ser: 0.8 mg/dL (ref 0.61–1.24)
GFR calc Af Amer: >60 mL/min (ref >60)
GFR calc non Af Amer: >60 mL/min (ref >60)
Glucose, Bld: 107 mg/dL — ABNORMAL HIGH (ref 70–99)
Potassium: 4.5 mmol/L (ref 3.5–5.1)
Sodium: 135 mmol/L (ref 135–145)
Total Bilirubin: 0.6 mg/dL (ref 0.3–1.2)
Total Protein: 6.6 g/dL (ref 6.5–8.1)

## 2020-04-05 NOTE — Assessment & Plan Note (Signed)
#  Chronic ITP currently on  Promacta 25 mg every other day.  Platelets 315.   #Hemolytic anemia hemoglobin 13/LDH normal.  Continue CellCept 1000 milligrams in the morning 500 mg at night-STABLE  # Hypocalcemia- 8.7; recommend vit D 1000 unit/day.   # DISPOSITION:labs print out # labs q 4 weeks- cbc;  # follow up in 4 months/labs-cbc/cmp- Dr.B

## 2020-04-05 NOTE — Progress Notes (Signed)
[place] OFFICE PROGRESS NOTE  Patient Care Team: Derinda Late, MD as PCP - General (Family Medicine)   SUMMARY OF ONCOLOGIC HISTORY:  #FEB 2016-  ITP  s/p Prednisone; OCT 2016- Relapse of ITP- Prednisone; JAN 13th- START RITUXAN q W x4 [finished Feb 8th]; March 10th 2017- N-plate q W- good response; Dec 2018- promacta 25 mg qOD.   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   # Hx of nose bleeds  INTERVAL HISTORY:  A very pleasant 86 year old male patient with a history of autoimmune hemolytic anemia/ITP is here for follow-up.  Patient denies any nausea vomiting.  Denies any easy bruising or bleeding.  Continues on CellCept/Promacta.  Review of Systems  Constitutional: Negative for chills, diaphoresis, fever, malaise/fatigue and weight loss.  HENT: Negative for nosebleeds and sore throat.   Eyes: Negative for double vision.  Respiratory: Negative for cough, hemoptysis, sputum production, shortness of breath and wheezing.   Cardiovascular: Negative for chest pain, palpitations, orthopnea and leg swelling.  Gastrointestinal: Negative for abdominal pain, blood in stool, constipation, diarrhea, heartburn, melena, nausea and vomiting.  Genitourinary: Negative for dysuria, frequency and urgency.  Musculoskeletal: Negative for back pain and joint pain.  Skin: Negative.  Negative for itching and rash.  Neurological: Negative for dizziness, tingling, focal weakness, weakness and headaches.  Endo/Heme/Allergies: Does not bruise/bleed easily.  Psychiatric/Behavioral: Negative for depression. The patient is not nervous/anxious and does not have insomnia.     PAST MEDICAL HISTORY :  Past Medical History:  Diagnosis Date  . Autoimmune hemolytic anemia   . Dehydration   . Detached retina   . Hypercholesteremia   . Hypertension   . Insomnia   . 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  Leukocytosis   . Prostate cancer ([place])   . Shingles     PAST SURGICAL HISTORY :   Past Surgical History:  Procedure Laterality Date  . artificial left eye    . EYE SURGERY    . HERNIA REPAIR    . JOINT REPLACEMENT    . PROSTATE SURGERY      FAMILY HISTORY :   Family History  Problem Relation Age of Onset  . Cancer Mother   . Multiple sclerosis Father     SOCIAL HISTORY:   Social History   Tobacco Use  . Smoking status: Never Smoker  . Smokeless tobacco: Never Used  Substance Use Topics  . Alcohol use: No  . Drug use: No    ALLERGIES:  is allergic to cephalexin and hydrocodone-acetaminophen.  MEDICATIONS:  Current Outpatient Medications  Medication Sig Dispense Refill  . acetaminophen (TYLENOL) 500 MG tablet Take 500 mg by mouth every 6 (six) hours as needed for mild pain. Reported on 01/24/2016    . citalopram (CELEXA) 10 MG tablet Take 10 mg by mouth daily.    . Cyanocobalamin (RA VITAMIN B-12 TR) 1000 MCG TBCR Take 1,000 mcg by mouth daily.    . feeding supplement, ENSURE ENLIVE, (ENSURE ENLIVE) LIQD Take 237 mLs by mouth 2 (two) times daily between meals. 237 mL 12  . gabapentin (NEURONTIN) 100 MG capsule Take 100 mg by mouth at bedtime. Reported on 12/03/2015    . lisinopril (PRINIVIL,ZESTRIL) 5 MG tablet Take 10 mg by mouth daily.     . meclizine (ANTIVERT) 25 MG tablet Take 1 tablet by mouth as needed for dizziness.    . mycophenolate (CELLCEPT) 500 MG tablet TAKE TWO TABLETS  EVERY MORNING AND TAKE ONE TABLET EVERY EVENING 90 tablet 3  . omeprazole (PRILOSEC) 40 MG capsule TAKE 1 CAPSULE BY MOUTH DAILY USUALLY 30 MINUTES BEFORE BREAKFAST 90 capsule 2  . polyethylene glycol (MIRALAX / GLYCOLAX) packet Take 17 g by mouth daily as needed for mild constipation. 14 each 0  . polyvinyl alcohol (LIQUIFILM TEARS) 1.4 % ophthalmic solution Place 2 drops into the left eye 3 (three) times daily as needed for dry eyes. 15 mL 0  . pravastatin (PRAVACHOL) 20 MG tablet Take 20 mg  by mouth at bedtime.     Marland Kitchen PROMACTA 25 MG tablet TAKE 1 TABLET (25 MG TOTAL) BY MOUTH EVERY OTHER DAY. TAKE ON AN EMPTY STOMACH, 1 HOUR BEFORE A MEAL OR 2 HOURS AFTER. 15 tablet 4   No current facility-administered medications for this visit.    PHYSICAL EXAMINATION:   BP 121/73 (BP Location: Left Arm, Patient Position: Sitting, Cuff Size: Normal)   Pulse 61   Temp (!) 95 F (35 C) (Tympanic)   Wt 173 lb (78.5 kg)   SpO2 99%   BMI 27.67 kg/m   Filed Weights   04/05/20 1408  Weight: 173 lb (78.5 kg)    Physical Exam  Constitutional: He is oriented to person, place, and time and well-developed, well-nourished, and in no distress.  HENT:  Head: Normocephalic and atraumatic.  Mouth/Throat: Oropharynx is clear and moist. No oropharyngeal exudate.  Eyes: Pupils are equal, round, and reactive to light.  Cardiovascular: Normal rate and regular rhythm.  Pulmonary/Chest: No respiratory distress. He has no wheezes.  Abdominal: Soft. Bowel sounds are normal. He exhibits no distension and no mass. There is no abdominal tenderness. There is no rebound and no guarding.  Musculoskeletal:        General: No tenderness or edema. Normal range of motion.     Cervical back: Normal range of motion and neck supple.  Neurological: He is alert and oriented to person, place, and time.  Skin: Skin is warm.  Psychiatric: Affect normal.     LABORATORY DATA:  I have reviewed the data as listed    Component Value Date/Time   NA 135 04/05/2020 1336   NA 131 (L) 03/17/2015 1425   K 4.5 04/05/2020 1336   K 3.9 03/17/2015 1425   CL 101 04/05/2020 1336   CL 101 03/17/2015 1425   CO2 27 04/05/2020 1336   CO2 26 03/17/2015 1425   GLUCOSE 107 (H) 04/05/2020 1336   GLUCOSE 161 (H) 03/17/2015 1425   BUN 15 04/05/2020 1336   BUN 17 03/17/2015 1425   CREATININE 0.80 04/05/2020 1336   CREATININE 0.90 03/17/2015 1425   CALCIUM 8.6 (L) 04/05/2020 1336   CALCIUM 8.5 (L) 03/17/2015 1425   PROT 6.6  04/05/2020 1336   PROT 6.7 03/17/2015 1425   ALBUMIN 3.7 04/05/2020 1336   ALBUMIN 3.7 03/17/2015 1425   AST 20 04/05/2020 1336   AST 25 03/17/2015 1425   ALT 18 04/05/2020 1336   ALT 23 03/17/2015 1425   ALKPHOS 55 04/05/2020 1336   ALKPHOS 44 03/17/2015 1425   BILITOT 0.6 04/05/2020 1336   BILITOT 0.6 03/17/2015 1425   GFRNONAA >60 04/05/2020 1336   GFRNONAA >60 03/17/2015 1425   GFRAA >60 04/05/2020 1336   GFRAA >60 03/17/2015 1425    No results found for: SPEP, UPEP  Lab Results  Component Value Date   WBC 8.0 04/05/2020   NEUTROABS 5.5 04/05/2020   HGB 13.3 04/05/2020  HCT 39.2 04/05/2020   MCV 99.5 04/05/2020   PLT 315 04/05/2020      Chemistry      Component Value Date/Time   NA 135 04/05/2020 1336   NA 131 (L) 03/17/2015 1425   K 4.5 04/05/2020 1336   K 3.9 03/17/2015 1425   CL 101 04/05/2020 1336   CL 101 03/17/2015 1425   CO2 27 04/05/2020 1336   CO2 26 03/17/2015 1425   BUN 15 04/05/2020 1336   BUN 17 03/17/2015 1425   CREATININE 0.80 04/05/2020 1336   CREATININE 0.90 03/17/2015 1425      Component Value Date/Time   CALCIUM 8.6 (L) 04/05/2020 1336   CALCIUM 8.5 (L) 03/17/2015 1425   ALKPHOS 55 04/05/2020 1336   ALKPHOS 44 03/17/2015 1425   AST 20 04/05/2020 1336   AST 25 03/17/2015 1425   ALT 18 04/05/2020 1336   ALT 23 03/17/2015 1425   BILITOT 0.6 04/05/2020 1336   BILITOT 0.6 03/17/2015 1425       ASSESSMENT & PLAN:   Idiopathic thrombocytopenic purpura (HCC) #Chronic ITP currently on  Promacta 25 mg every other day.  Platelets 315.   #Hemolytic anemia hemoglobin 13/LDH normal.  Continue CellCept 1000 milligrams in the morning 500 mg at night-STABLE  # Hypocalcemia- 8.7; recommend vit D 1000 unit/day.   # DISPOSITION:labs print out # labs q 4 weeks- cbc;  # follow up in 4 months/labs-cbc/cmp- Dr.B      [name], MD 04/05/2020 3:09 PM

## 2020-04-06 ENCOUNTER — Other Ambulatory Visit: Payer: Self-pay | Admitting: Internal Medicine

## 2020-04-06 DIAGNOSIS — D693 Immune thrombocytopenic purpura: Secondary | ICD-10-CM

## 2020-04-22 DIAGNOSIS — E7800 Pure hypercholesterolemia, unspecified: Secondary | ICD-10-CM | POA: Diagnosis not present

## 2020-04-22 DIAGNOSIS — Z79899 Other long term (current) drug therapy: Secondary | ICD-10-CM | POA: Diagnosis not present

## 2020-05-03 ENCOUNTER — Other Ambulatory Visit: Payer: Self-pay

## 2020-05-03 ENCOUNTER — Inpatient Hospital Stay: Payer: PPO | Attending: Internal Medicine

## 2020-05-03 DIAGNOSIS — Z8546 Personal history of malignant neoplasm of prostate: Secondary | ICD-10-CM | POA: Insufficient documentation

## 2020-05-03 DIAGNOSIS — E7800 Pure hypercholesterolemia, unspecified: Secondary | ICD-10-CM | POA: Insufficient documentation

## 2020-05-03 DIAGNOSIS — D591 Autoimmune hemolytic anemia, unspecified: Secondary | ICD-10-CM | POA: Insufficient documentation

## 2020-05-03 DIAGNOSIS — I10 Essential (primary) hypertension: Secondary | ICD-10-CM | POA: Diagnosis not present

## 2020-05-03 DIAGNOSIS — Z79899 Other long term (current) drug therapy: Secondary | ICD-10-CM | POA: Insufficient documentation

## 2020-05-03 DIAGNOSIS — D693 Immune thrombocytopenic purpura: Secondary | ICD-10-CM | POA: Diagnosis not present

## 2020-05-03 LAB — CBC WITH DIFFERENTIAL/PLATELET
Abs Immature Granulocytes: 0.03 10*3/uL (ref 0.00–0.07)
Basophils Absolute: 0 10*3/uL (ref 0.0–0.1)
Basophils Relative: 1 %
Eosinophils Absolute: 0.2 10*3/uL (ref 0.0–0.5)
Eosinophils Relative: 2 %
HCT: 39.3 % (ref 39.0–52.0)
Hemoglobin: 13.3 g/dL (ref 13.0–17.0)
Immature Granulocytes: 0 %
Lymphocytes Relative: 18 %
Lymphs Abs: 1.5 10*3/uL (ref 0.7–4.0)
MCH: 33.3 pg (ref 26.0–34.0)
MCHC: 33.8 g/dL (ref 30.0–36.0)
MCV: 98.3 fL (ref 80.0–100.0)
Monocytes Absolute: 0.7 10*3/uL (ref 0.1–1.0)
Monocytes Relative: 9 %
Neutro Abs: 6 10*3/uL (ref 1.7–7.7)
Neutrophils Relative %: 70 %
Platelets: 321 10*3/uL (ref 150–400)
RBC: 4 MIL/uL — ABNORMAL LOW (ref 4.22–5.81)
RDW: 13 % (ref 11.5–15.5)
WBC: 8.4 10*3/uL (ref 4.0–10.5)
nRBC: 0 % (ref 0.0–0.2)

## 2020-05-04 DIAGNOSIS — D590 Drug-induced autoimmune hemolytic anemia: Secondary | ICD-10-CM | POA: Diagnosis not present

## 2020-05-04 DIAGNOSIS — Z79899 Other long term (current) drug therapy: Secondary | ICD-10-CM | POA: Diagnosis not present

## 2020-05-04 DIAGNOSIS — C61 Malignant neoplasm of prostate: Secondary | ICD-10-CM | POA: Diagnosis not present

## 2020-05-04 DIAGNOSIS — D693 Immune thrombocytopenic purpura: Secondary | ICD-10-CM | POA: Diagnosis not present

## 2020-05-04 DIAGNOSIS — E7800 Pure hypercholesterolemia, unspecified: Secondary | ICD-10-CM | POA: Diagnosis not present

## 2020-05-04 DIAGNOSIS — I10 Essential (primary) hypertension: Secondary | ICD-10-CM | POA: Diagnosis not present

## 2020-05-17 DIAGNOSIS — C61 Malignant neoplasm of prostate: Secondary | ICD-10-CM | POA: Diagnosis not present

## 2020-05-17 DIAGNOSIS — Z8546 Personal history of malignant neoplasm of prostate: Secondary | ICD-10-CM | POA: Diagnosis not present

## 2020-05-17 DIAGNOSIS — Z08 Encounter for follow-up examination after completed treatment for malignant neoplasm: Secondary | ICD-10-CM | POA: Diagnosis not present

## 2020-05-28 ENCOUNTER — Other Ambulatory Visit: Payer: Self-pay | Admitting: *Deleted

## 2020-05-28 DIAGNOSIS — D693 Immune thrombocytopenic purpura: Secondary | ICD-10-CM

## 2020-05-31 ENCOUNTER — Inpatient Hospital Stay: Payer: PPO | Attending: Internal Medicine

## 2020-05-31 ENCOUNTER — Other Ambulatory Visit: Payer: Self-pay

## 2020-05-31 DIAGNOSIS — D693 Immune thrombocytopenic purpura: Secondary | ICD-10-CM | POA: Diagnosis not present

## 2020-05-31 LAB — CBC WITH DIFFERENTIAL/PLATELET
Abs Immature Granulocytes: 0.03 10*3/uL (ref 0.00–0.07)
Basophils Absolute: 0 10*3/uL (ref 0.0–0.1)
Basophils Relative: 0 %
Eosinophils Absolute: 0.2 10*3/uL (ref 0.0–0.5)
Eosinophils Relative: 2 %
HCT: 38.5 % — ABNORMAL LOW (ref 39.0–52.0)
Hemoglobin: 13.3 g/dL (ref 13.0–17.0)
Immature Granulocytes: 0 %
Lymphocytes Relative: 17 %
Lymphs Abs: 1.9 10*3/uL (ref 0.7–4.0)
MCH: 33.5 pg (ref 26.0–34.0)
MCHC: 34.5 g/dL (ref 30.0–36.0)
MCV: 97 fL (ref 80.0–100.0)
Monocytes Absolute: 1 10*3/uL (ref 0.1–1.0)
Monocytes Relative: 9 %
Neutro Abs: 7.9 10*3/uL — ABNORMAL HIGH (ref 1.7–7.7)
Neutrophils Relative %: 72 %
Platelets: 318 10*3/uL (ref 150–400)
RBC: 3.97 MIL/uL — ABNORMAL LOW (ref 4.22–5.81)
RDW: 13 % (ref 11.5–15.5)
WBC: 10.9 10*3/uL — ABNORMAL HIGH (ref 4.0–10.5)
nRBC: 0 % (ref 0.0–0.2)

## 2020-06-01 ENCOUNTER — Emergency Department
Admission: EM | Admit: 2020-06-01 | Discharge: 2020-06-02 | Disposition: A | Discharge status: Home / Self Care | Payer: PPO | Attending: Emergency Medicine | Admitting: Emergency Medicine

## 2020-06-01 ENCOUNTER — Encounter: Payer: Self-pay | Admitting: Emergency Medicine

## 2020-06-01 ENCOUNTER — Other Ambulatory Visit: Payer: Self-pay

## 2020-06-01 DIAGNOSIS — W010XXA Fall on same level from slipping, tripping and stumbling without subsequent striking against object, initial encounter: Secondary | ICD-10-CM | POA: Insufficient documentation

## 2020-06-01 DIAGNOSIS — Z79899 Other long term (current) drug therapy: Secondary | ICD-10-CM | POA: Diagnosis not present

## 2020-06-01 DIAGNOSIS — I10 Essential (primary) hypertension: Secondary | ICD-10-CM | POA: Insufficient documentation

## 2020-06-01 DIAGNOSIS — Y93H2 Activity, gardening and landscaping: Secondary | ICD-10-CM | POA: Insufficient documentation

## 2020-06-01 DIAGNOSIS — S01112A Laceration without foreign body of left eyelid and periocular area, initial encounter: Secondary | ICD-10-CM | POA: Diagnosis not present

## 2020-06-01 DIAGNOSIS — Y999 Unspecified external cause status: Secondary | ICD-10-CM | POA: Insufficient documentation

## 2020-06-01 DIAGNOSIS — Y92838 Other recreation area as the place of occurrence of the external cause: Secondary | ICD-10-CM | POA: Insufficient documentation

## 2020-06-01 DIAGNOSIS — S0181XA Laceration without foreign body of other part of head, initial encounter: Secondary | ICD-10-CM | POA: Insufficient documentation

## 2020-06-01 DIAGNOSIS — C61 Malignant neoplasm of prostate: Secondary | ICD-10-CM | POA: Insufficient documentation

## 2020-06-01 NOTE — ED Notes (Signed)
Dr. Beather Arbour at the bedside for pt evaluation and laceration repair.

## 2020-06-01 NOTE — ED Triage Notes (Signed)
Pt presents to ED with c/o laceration just above his left eye after falling while outside watering plants. Pt states he leaned over too far tipping forward and hit his head on the concrete. Denies loc. Pt denies use of blood thinners. Laceration noted with bleeding controlled. Denies any other injury.

## 2020-06-01 NOTE — ED Provider Notes (Signed)
[facility] Emergency Department Provider Note   ____________________________________________   First MD Initiated Contact with Patient 06/01/20 2339     (approximate)  I have reviewed the triage vital signs and the nursing notes.   HISTORY  Chief Complaint Laceration    HPI [name]. is a 86 y.o. male who presents to the ED from home with a chief complaint of facial laceration.  Patient lost his balance and fell outdoors while watering plants.  His glasses cut his face just above his left eye.  Denies LOC.  Denies anticoagulant use.  Tetanus is up-to-date.  Incident occurred approximately 4 hours ago.  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  Voices no other complaints or injuries.       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 Authorizing Provider  acetaminophen (TYLENOL) 500 MG tablet Take 500 mg by mouth every 6 (six) hours as needed for mild pain. Reported on 01/24/2016    [provider]  citalopram (CELEXA) 10 MG tablet Take 10 mg by mouth daily.    [provider]  Cyanocobalamin (RA VITAMIN B-12 TR) 1000 MCG TBCR Take 1,000 mcg by mouth  daily.    [provider]  feeding supplement, ENSURE ENLIVE, (ENSURE ENLIVE) LIQD Take 237 mLs by mouth 2 (two) times daily between meals. 04/08/15   [name], MD  gabapentin (NEURONTIN) 100 MG capsule Take 100 mg by mouth at bedtime. Reported on 12/03/2015    [provider]  lisinopril (PRINIVIL,ZESTRIL) 5 MG tablet Take 10 mg by mouth daily.  01/12/17   [provider]  meclizine (ANTIVERT) 25 MG tablet Take 1 tablet by mouth as needed for dizziness. 12/05/18   [provider]  mycophenolate (CELLCEPT) 500 MG tablet TAKE TWO TABLETS EVERY MORNING AND TAKE ONE TABLET EVERY EVENING 04/07/20   [name], MD  omeprazole (PRILOSEC) 40 MG capsule TAKE 1 CAPSULE BY MOUTH DAILY USUALLY 30 MINUTES BEFORE BREAKFAST 09/28/17   [name], MD  polyethylene glycol (MIRALAX / GLYCOLAX) packet Take 17 g by mouth daily as needed for mild constipation. 12/29/15   [name], MD  polyvinyl alcohol (LIQUIFILM TEARS) 1.4 % ophthalmic solution Place 2 drops into the left eye 3 (three) times daily as needed for dry eyes. 01/10/16   [name], MD  pravastatin (PRAVACHOL) 20 MG tablet Take 20 mg by mouth at bedtime.     [provider]  PROMACTA 25 MG tablet TAKE 1 TABLET (25 MG TOTAL) BY MOUTH EVERY OTHER DAY. TAKE ON AN EMPTY STOMACH, 1 HOUR BEFORE A MEAL  OR 2 HOURS AFTER. 07/29/19   [name], MD    Allergies Cephalexin and Hydrocodone-acetaminophen  Family History  Problem Relation Age of Onset   Cancer Mother    Multiple sclerosis Father     Social History Social History   Tobacco Use   Smoking status: Never Smoker   Smokeless tobacco: Never Used  Scientific laboratory technician Use: Never used  Substance Use Topics   Alcohol use: Yes   Drug use: No    Review of Systems  Constitutional: No fever/chills Eyes: No visual changes. ENT: No sore throat. Cardiovascular: Denies chest pain. Respiratory: Denies  shortness of breath. Gastrointestinal: No abdominal pain.  No nausea, no vomiting.  No diarrhea.  No constipation. Genitourinary: Negative for dysuria. Musculoskeletal: Negative for back pain. Skin: Positive for facial laceration.  Negative for rash. Neurological: Negative for headaches, focal weakness or numbness.   ____________________________________________   PHYSICAL EXAM:  VITAL SIGNS: ED Triage Vitals  Enc Vitals Group     BP 06/01/20 2157 (!) 165/74     Pulse Rate 06/01/20 2157 60     Resp 06/01/20 2157 18     Temp 06/01/20 2157 98.2 F (36.8 C)     Temp Source 06/01/20 2157 Oral     SpO2 06/01/20 2157 100 %     Weight 06/01/20 2159 165 lb (74.8 kg)     Height 06/01/20 2159 5\' 8"  (1.727 m)     Head Circumference --      Peak Flow --      Pain Score 06/01/20 2159 4     Pain Loc --      Pain Edu? --      Excl. in [place]? --     Constitutional: Alert and oriented. Well appearing and in no acute distress. Eyes: Approximately 1 cm curvilinear and superficial laceration above and lateral to left eyebrow without active bleeding or gaping.  Conjunctivae are normal. PERRL. EOMI. Head: Atraumatic. Nose: Atraumatic. Mouth/Throat: Mucous membranes are moist.  No dental malocclusion.  Neck: No stridor.  No cervical spine tenderness to palpation. Cardiovascular: Normal rate, regular rhythm. Grossly normal heart sounds.  Good peripheral circulation. Respiratory: Normal respiratory effort.  No retractions. Lungs CTAB. Gastrointestinal: Soft and nontender. No distention. No abdominal bruits. No CVA tenderness. Musculoskeletal: No lower extremity tenderness nor edema.  No joint effusions. Neurologic: Alert and oriented x3.  CN II-XII grossly intact.  Normal speech and language. No gross focal neurologic deficits are appreciated. No gait instability. Skin:  Skin is warm, dry and intact. No rash noted. Psychiatric: Mood and affect are normal. Speech and behavior are  normal.  ____________________________________________   LABS (all labs ordered are listed, but only abnormal results are displayed)  Labs Reviewed - No data to display ____________________________________________  EKG  None ____________________________________________  RADIOLOGY  ED MD interpretation: None  Official radiology report(s): No results found.  ____________________________________________   PROCEDURES  Procedure(s) performed (including Critical Care):  Marland KitchenMarland KitchenLaceration Repair  Date/Time: 06/01/2020 11:47 PM Performed by: Paulette Blanch, MD Authorized by: Paulette Blanch, MD   Consent:    Consent obtained:  Verbal   Consent given by:  Patient   Risks discussed:  Infection, pain, poor cosmetic result and poor wound healing Anesthesia (see MAR for exact dosages):    Anesthesia method:  None Laceration details:    Location:  Face   Face location:  L eyebrow   Length (cm):  1   Depth (mm):  2 Repair  type:    Repair type:  Simple Exploration:    Hemostasis achieved with:  Direct pressure   Wound exploration: entire depth of wound probed and visualized     Contaminated: no   Treatment:    Area cleansed with:  Saline   Amount of cleaning:  Standard   Irrigation solution:  Sterile saline   Visualized foreign bodies/material removed: no   Skin repair:    Repair method:  Steri-Strips and tissue adhesive Approximation:    Approximation:  Close Post-procedure details:    Patient tolerance of procedure:  Tolerated well, no immediate complications     ____________________________________________   INITIAL IMPRESSION / ASSESSMENT AND PLAN / ED COURSE  As part of my medical decision making, I reviewed the following data within the [place] notes reviewed and incorporated, Old chart reviewed and Notes from prior ED visits     Drystan Reader. was evaluated in Emergency Department on 06/02/2020 for the symptoms described in the  history of present illness. He was evaluated in the context of the global COVID-19 pandemic, which necessitated consideration that the patient might be at risk for infection with the SARS-CoV-2 virus that causes COVID-19. Institutional protocols and algorithms that pertain to the evaluation of patients at risk for COVID-19 are in a state of rapid change based on information released by regulatory bodies including the CDC and federal and state organizations. These policies and algorithms were followed during the patient's care in the ED.    86 year old male presenting with superficial facial laceration.  Tolerated Dermabond and Steri-Strips well.  Strict return precautions given.  Patient verbalizes understanding and agrees with plan of care.      ____________________________________________   FINAL CLINICAL IMPRESSION(S) / ED DIAGNOSES  Final diagnoses:  Facial laceration, initial encounter     ED Discharge Orders    None       Note:  This document was prepared using Dragon voice recognition software and may include unintentional dictation errors.   Paulette Blanch, MD 06/02/20 0230

## 2020-06-01 NOTE — Discharge Instructions (Addendum)
You may remove Steri-Strips in 7 days if they have not already fallen off.  You may wash your face as usual.  Return to the ER for worsening symptoms, increased redness/swelling, purulent discharge or other concerns.

## 2020-06-03 DIAGNOSIS — S0181XD Laceration without foreign body of other part of head, subsequent encounter: Secondary | ICD-10-CM | POA: Diagnosis not present

## 2020-06-03 DIAGNOSIS — Z9181 History of falling: Secondary | ICD-10-CM | POA: Diagnosis not present

## 2020-06-28 ENCOUNTER — Inpatient Hospital Stay: Payer: PPO | Attending: Internal Medicine

## 2020-06-28 ENCOUNTER — Other Ambulatory Visit: Payer: Self-pay

## 2020-06-28 DIAGNOSIS — D696 Thrombocytopenia, unspecified: Secondary | ICD-10-CM | POA: Diagnosis not present

## 2020-06-28 DIAGNOSIS — D693 Immune thrombocytopenic purpura: Secondary | ICD-10-CM

## 2020-06-28 LAB — CBC WITH DIFFERENTIAL/PLATELET
Abs Immature Granulocytes: 0.03 10*3/uL (ref 0.00–0.07)
Basophils Absolute: 0 10*3/uL (ref 0.0–0.1)
Basophils Relative: 0 %
Eosinophils Absolute: 0.2 10*3/uL (ref 0.0–0.5)
Eosinophils Relative: 3 %
HCT: 38.4 % — ABNORMAL LOW (ref 39.0–52.0)
Hemoglobin: 13.1 g/dL (ref 13.0–17.0)
Immature Granulocytes: 0 %
Lymphocytes Relative: 20 %
Lymphs Abs: 1.5 10*3/uL (ref 0.7–4.0)
MCH: 33.5 pg (ref 26.0–34.0)
MCHC: 34.1 g/dL (ref 30.0–36.0)
MCV: 98.2 fL (ref 80.0–100.0)
Monocytes Absolute: 0.7 10*3/uL (ref 0.1–1.0)
Monocytes Relative: 9 %
Neutro Abs: 5.1 10*3/uL (ref 1.7–7.7)
Neutrophils Relative %: 68 %
Platelets: 303 10*3/uL (ref 150–400)
RBC: 3.91 MIL/uL — ABNORMAL LOW (ref 4.22–5.81)
RDW: 13.1 % (ref 11.5–15.5)
WBC: 7.6 10*3/uL (ref 4.0–10.5)
nRBC: 0 % (ref 0.0–0.2)

## 2020-06-30 DIAGNOSIS — G453 Amaurosis fugax: Secondary | ICD-10-CM | POA: Diagnosis not present

## 2020-06-30 DIAGNOSIS — I10 Essential (primary) hypertension: Secondary | ICD-10-CM | POA: Diagnosis not present

## 2020-06-30 DIAGNOSIS — R55 Syncope and collapse: Secondary | ICD-10-CM | POA: Diagnosis not present

## 2020-07-01 DIAGNOSIS — H40051 Ocular hypertension, right eye: Secondary | ICD-10-CM | POA: Diagnosis not present

## 2020-07-13 ENCOUNTER — Other Ambulatory Visit: Payer: Self-pay | Admitting: *Deleted

## 2020-07-13 DIAGNOSIS — D693 Immune thrombocytopenic purpura: Secondary | ICD-10-CM

## 2020-07-13 MED ORDER — ELTROMBOPAG OLAMINE 25 MG PO TABS: ORAL_TABLET | ORAL | 4 refills | Status: DC

## 2020-07-16 ENCOUNTER — Telehealth: Payer: Self-pay | Admitting: *Deleted

## 2020-07-16 DIAGNOSIS — D693 Immune thrombocytopenic purpura: Secondary | ICD-10-CM

## 2020-07-16 MED ORDER — ELTROMBOPAG OLAMINE 25 MG PO TABS: ORAL_TABLET | ORAL | 4 refills | Status: DC

## 2020-07-16 NOTE — Telephone Encounter (Signed)
-----   Message from [place] sent at 07/16/2020  9:40 AM EDT ----- Regarding: [place],   I just uploaded an [place]  into this pts chart in Epic under the media tab that needs to be filled out by Dr. B and faxed back. Thanks!

## 2020-07-16 NOTE — Telephone Encounter (Signed)
New rx resbumitted. Pharmacy prefers #30 instead of #15 for 60 days supply.

## 2020-07-30 ENCOUNTER — Other Ambulatory Visit: Payer: Self-pay

## 2020-08-02 ENCOUNTER — Inpatient Hospital Stay (HOSPITAL_BASED_OUTPATIENT_CLINIC_OR_DEPARTMENT_OTHER): Payer: PPO | Admitting: Internal Medicine

## 2020-08-02 ENCOUNTER — Other Ambulatory Visit: Payer: Self-pay

## 2020-08-02 ENCOUNTER — Encounter: Payer: Self-pay | Admitting: Internal Medicine

## 2020-08-02 ENCOUNTER — Inpatient Hospital Stay: Payer: PPO | Attending: Internal Medicine

## 2020-08-02 DIAGNOSIS — Z79899 Other long term (current) drug therapy: Secondary | ICD-10-CM | POA: Diagnosis not present

## 2020-08-02 DIAGNOSIS — D591 Autoimmune hemolytic anemia, unspecified: Secondary | ICD-10-CM | POA: Diagnosis not present

## 2020-08-02 DIAGNOSIS — D693 Immune thrombocytopenic purpura: Secondary | ICD-10-CM | POA: Insufficient documentation

## 2020-08-02 DIAGNOSIS — Z8546 Personal history of malignant neoplasm of prostate: Secondary | ICD-10-CM | POA: Diagnosis not present

## 2020-08-02 LAB — CBC WITH DIFFERENTIAL/PLATELET
Abs Immature Granulocytes: 0.03 10*3/uL (ref 0.00–0.07)
Basophils Absolute: 0 10*3/uL (ref 0.0–0.1)
Basophils Relative: 1 %
Eosinophils Absolute: 0.2 10*3/uL (ref 0.0–0.5)
Eosinophils Relative: 2 %
HCT: 38 % — ABNORMAL LOW (ref 39.0–52.0)
Hemoglobin: 13.2 g/dL (ref 13.0–17.0)
Immature Granulocytes: 0 %
Lymphocytes Relative: 16 %
Lymphs Abs: 1.2 10*3/uL (ref 0.7–4.0)
MCH: 34.5 pg — ABNORMAL HIGH (ref 26.0–34.0)
MCHC: 34.7 g/dL (ref 30.0–36.0)
MCV: 99.2 fL (ref 80.0–100.0)
Monocytes Absolute: 0.6 10*3/uL (ref 0.1–1.0)
Monocytes Relative: 8 %
Neutro Abs: 5.4 10*3/uL (ref 1.7–7.7)
Neutrophils Relative %: 73 %
Platelets: 293 10*3/uL (ref 150–400)
RBC: 3.83 MIL/uL — ABNORMAL LOW (ref 4.22–5.81)
RDW: 13.2 % (ref 11.5–15.5)
WBC: 7.4 10*3/uL (ref 4.0–10.5)
nRBC: 0 % (ref 0.0–0.2)

## 2020-08-02 LAB — COMPREHENSIVE METABOLIC PANEL
ALT: 17 U/L (ref 0–44)
AST: 20 U/L (ref 15–41)
Albumin: 3.7 g/dL (ref 3.5–5.0)
Alkaline Phosphatase: 48 U/L (ref 38–126)
Anion gap: 7 (ref 5–15)
BUN: 18 mg/dL (ref 8–23)
CO2: 27 mmol/L (ref 22–32)
Calcium: 8.5 mg/dL — ABNORMAL LOW (ref 8.9–10.3)
Chloride: 104 mmol/L (ref 98–111)
Creatinine, Ser: 0.84 mg/dL (ref 0.61–1.24)
GFR calc Af Amer: >60 mL/min (ref >60)
GFR calc non Af Amer: >60 mL/min (ref >60)
Glucose, Bld: 104 mg/dL — ABNORMAL HIGH (ref 70–99)
Potassium: 4.1 mmol/L (ref 3.5–5.1)
Sodium: 138 mmol/L (ref 135–145)
Total Bilirubin: 0.8 mg/dL (ref 0.3–1.2)
Total Protein: 6.5 g/dL (ref 6.5–8.1)

## 2020-08-02 NOTE — Progress Notes (Signed)
[facility] OFFICE PROGRESS NOTE  Patient Care Team: Derinda Late, MD as PCP - General (Family Medicine)   SUMMARY OF ONCOLOGIC HISTORY:  #FEB 2016-  ITP  s/p Prednisone; OCT 2016- Relapse of ITP- Prednisone; JAN 13th- START RITUXAN q W x4 [finished Feb 8th]; March 10th 2017- N-plate q W- good response; Dec 2018- promacta 25 mg qOD.   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   # Hx of nose bleeds  INTERVAL HISTORY:  A very pleasant 86 year old male patient with a history of autoimmune hemolytic anemia/ITP is here for follow-up.  Patient states that he fell leaning over sometime in July 2021.  He has since followed up with his PCP.  Otherwise denies any nausea vomiting headaches.  No easy bruising or bleeding.  Continues to plan CellCept/Promacta.  Continues to have pain/joint pains.  Review of Systems  Constitutional: Negative for chills, diaphoresis, fever, malaise/fatigue and weight loss.  HENT: Negative for nosebleeds and sore throat.   Eyes: Negative for double vision.  Respiratory: Negative for cough, hemoptysis, sputum production, shortness of breath and wheezing.   Cardiovascular: Negative for chest pain, palpitations, orthopnea and leg swelling.  Gastrointestinal: Negative for abdominal pain, blood in stool, constipation, diarrhea, heartburn, melena, nausea and vomiting.  Genitourinary: Negative for dysuria, frequency and urgency.  Musculoskeletal: Positive for back pain and joint pain.  Skin: Negative.  Negative for itching and rash.  Neurological: Negative for dizziness, tingling, focal weakness, weakness and headaches.  Endo/Heme/Allergies: Does not bruise/bleed easily.  Psychiatric/Behavioral: Negative for depression. The patient is not nervous/anxious and does not have insomnia.     PAST MEDICAL HISTORY :  Past Medical History:  Diagnosis Date  . Autoimmune hemolytic anemia   .  Dehydration   . Detached retina   . Hypercholesteremia   . Hypertension   . Insomnia   . 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 Leukocytosis   . Prostate cancer ([place])   . Shingles     PAST SURGICAL HISTORY :   Past Surgical History:  Procedure Laterality Date  . artificial left eye    . EYE SURGERY    . HERNIA REPAIR    . JOINT REPLACEMENT    . PROSTATE SURGERY      FAMILY HISTORY :   Family History  Problem Relation Age of Onset  . Cancer Mother   . Multiple sclerosis Father     SOCIAL HISTORY:   Social History   Tobacco Use  . Smoking status: Never Smoker  . Smokeless tobacco: Never Used  Vaping Use  . Vaping Use: Never used  Substance Use Topics  . Alcohol use: Yes  . Drug use: No    ALLERGIES:  is allergic to cephalexin and hydrocodone-acetaminophen.  MEDICATIONS:  Current Outpatient Medications  Medication Sig Dispense Refill  . acetaminophen (TYLENOL) 500 MG tablet Take 500 mg by mouth every 6 (six) hours as needed for mild pain. Reported on 01/24/2016    . citalopram (CELEXA) 10 MG tablet Take 10 mg by mouth daily.    . Cyanocobalamin (RA VITAMIN B-12 TR) 1000 MCG TBCR Take 1,000 mcg by mouth daily.    Marland Kitchen eltrombopag (PROMACTA) 25 MG tablet TAKE 1 TABLET (25 MG TOTAL) BY MOUTH EVERY OTHER DAY. TAKE ON AN EMPTY STOMACH, 1 HOUR BEFORE A MEAL OR 2 HOURS AFTER. 30 tablet 4  . feeding supplement, ENSURE ENLIVE, (ENSURE ENLIVE) LIQD Take 237 mLs by mouth  2 (two) times daily between meals. 237 mL 12  . gabapentin (NEURONTIN) 100 MG capsule Take 100 mg by mouth at bedtime. Reported on 12/03/2015    . lisinopril (PRINIVIL,ZESTRIL) 5 MG tablet Take 10 mg by mouth daily.     . meclizine (ANTIVERT) 25 MG tablet Take 1 tablet by mouth as needed for dizziness.    . mycophenolate (CELLCEPT) 500 MG tablet TAKE TWO TABLETS EVERY MORNING AND TAKE ONE TABLET EVERY EVENING 90 tablet 3  . omeprazole (PRILOSEC) 40 MG capsule TAKE 1 CAPSULE BY MOUTH DAILY USUALLY  30 MINUTES BEFORE BREAKFAST 90 capsule 2  . polyethylene glycol (MIRALAX / GLYCOLAX) packet Take 17 g by mouth daily as needed for mild constipation. 14 each 0  . polyvinyl alcohol (LIQUIFILM TEARS) 1.4 % ophthalmic solution Place 2 drops into the left eye 3 (three) times daily as needed for dry eyes. 15 mL 0  . pravastatin (PRAVACHOL) 20 MG tablet Take 20 mg by mouth at bedtime.      No current facility-administered medications for this visit.    PHYSICAL EXAMINATION:   BP (!) 144/77   Pulse 61   Temp 98.2 F (36.8 C) (Tympanic)   Resp 18   Ht 5\' 8"  (1.727 m)   Wt 173 lb (78.5 kg)   BMI 26.30 kg/m   Filed Weights   08/02/20 1340  Weight: 173 lb (78.5 kg)    Physical Exam HENT:     Head: Normocephalic and atraumatic.     Mouth/Throat:     Pharynx: No oropharyngeal exudate.  Eyes:     Pupils: Pupils are equal, round, and reactive to light.  Cardiovascular:     Rate and Rhythm: Normal rate and regular rhythm.  Pulmonary:     Effort: No respiratory distress.     Breath sounds: No wheezing.  Abdominal:     General: Bowel sounds are normal. There is no distension.     Palpations: Abdomen is soft. There is no mass.     Tenderness: There is no abdominal tenderness. There is no guarding or rebound.  Musculoskeletal:        General: No tenderness. Normal range of motion.     Cervical back: Normal range of motion and neck supple.  Skin:    General: Skin is warm.  Neurological:     Mental Status: He is alert and oriented to person, place, and time.  Psychiatric:        Mood and Affect: Affect normal.      LABORATORY DATA:  I have reviewed the data as listed    Component Value Date/Time   NA 138 08/02/2020 1322   NA 131 (L) 03/17/2015 1425   K 4.1 08/02/2020 1322   K 3.9 03/17/2015 1425   CL 104 08/02/2020 1322   CL 101 03/17/2015 1425   CO2 27 08/02/2020 1322   CO2 26 03/17/2015 1425   GLUCOSE 104 (H) 08/02/2020 1322   GLUCOSE 161 (H) 03/17/2015 1425   BUN 18  08/02/2020 1322   BUN 17 03/17/2015 1425   CREATININE 0.84 08/02/2020 1322   CREATININE 0.90 03/17/2015 1425   CALCIUM 8.5 (L) 08/02/2020 1322   CALCIUM 8.5 (L) 03/17/2015 1425   PROT 6.5 08/02/2020 1322   PROT 6.7 03/17/2015 1425   ALBUMIN 3.7 08/02/2020 1322   ALBUMIN 3.7 03/17/2015 1425   AST 20 08/02/2020 1322   AST 25 03/17/2015 1425   ALT 17 08/02/2020 1322   ALT 23 03/17/2015 1425  ALKPHOS 48 08/02/2020 1322   ALKPHOS 44 03/17/2015 1425   BILITOT 0.8 08/02/2020 1322   BILITOT 0.6 03/17/2015 1425   GFRNONAA >60 08/02/2020 1322   GFRNONAA >60 03/17/2015 1425   GFRAA >60 08/02/2020 1322   GFRAA >60 03/17/2015 1425    No results found for: SPEP, UPEP  Lab Results  Component Value Date   WBC 7.4 08/02/2020   NEUTROABS 5.4 08/02/2020   HGB 13.2 08/02/2020   HCT 38.0 (L) 08/02/2020   MCV 99.2 08/02/2020   PLT 293 08/02/2020      Chemistry      Component Value Date/Time   NA 138 08/02/2020 1322   NA 131 (L) 03/17/2015 1425   K 4.1 08/02/2020 1322   K 3.9 03/17/2015 1425   CL 104 08/02/2020 1322   CL 101 03/17/2015 1425   CO2 27 08/02/2020 1322   CO2 26 03/17/2015 1425   BUN 18 08/02/2020 1322   BUN 17 03/17/2015 1425   CREATININE 0.84 08/02/2020 1322   CREATININE 0.90 03/17/2015 1425      Component Value Date/Time   CALCIUM 8.5 (L) 08/02/2020 1322   CALCIUM 8.5 (L) 03/17/2015 1425   ALKPHOS 48 08/02/2020 1322   ALKPHOS 44 03/17/2015 1425   AST 20 08/02/2020 1322   AST 25 03/17/2015 1425   ALT 17 08/02/2020 1322   ALT 23 03/17/2015 1425   BILITOT 0.8 08/02/2020 1322   BILITOT 0.6 03/17/2015 1425       ASSESSMENT & PLAN:   Idiopathic thrombocytopenic purpura (HCC) #Chronic ITP currently on  Promacta 25 mg every other day.  Platelets 293- STABLE.   # Hemolytic anemia hemoglobin 13.2/LDH normal.  Continue CellCept 1000 milligrams in the morning 500 mg at night-STABLE  # Hypocalcemia- 8.5; recommend vit D 1000 unit/day.   # Hx of fall/ sec to  leaning- no vertigo [Dr.McQueen]- ? Gait instability-if not improved refer to PCP.  # DISPOSITION:labs print out # labs q 4 weeks- cbc;  # follow up in 4 months/labs-cbc/cmp- Dr.B      [name], MD 08/02/2020 2:49 PM

## 2020-08-02 NOTE — Assessment & Plan Note (Addendum)
#  Chronic ITP currently on  Promacta 25 mg every other day.  Platelets 293- STABLE.   # Hemolytic anemia hemoglobin 13.2/LDH normal.  Continue CellCept 1000 milligrams in the morning 500 mg at night-STABLE  # Hypocalcemia- 8.5; recommend vit D 1000 unit/day.   # Hx of fall/ sec to leaning- no vertigo [Dr.McQueen]- ? Gait instability-if not improved refer to PCP.  # DISPOSITION:labs print out # labs q 4 weeks- cbc;  # follow up in 4 months/labs-cbc/cmp- Dr.B

## 2020-08-16 ENCOUNTER — Other Ambulatory Visit: Payer: Self-pay | Admitting: Internal Medicine

## 2020-08-16 DIAGNOSIS — D693 Immune thrombocytopenic purpura: Secondary | ICD-10-CM

## 2020-08-30 ENCOUNTER — Other Ambulatory Visit: Payer: Self-pay

## 2020-08-30 ENCOUNTER — Inpatient Hospital Stay: Payer: PPO | Attending: Internal Medicine

## 2020-08-30 DIAGNOSIS — D693 Immune thrombocytopenic purpura: Secondary | ICD-10-CM | POA: Diagnosis not present

## 2020-08-30 LAB — CBC WITH DIFFERENTIAL/PLATELET
Abs Immature Granulocytes: 0.03 10*3/uL (ref 0.00–0.07)
Basophils Absolute: 0 10*3/uL (ref 0.0–0.1)
Basophils Relative: 1 %
Eosinophils Absolute: 0.2 10*3/uL (ref 0.0–0.5)
Eosinophils Relative: 2 %
HCT: 40.2 % (ref 39.0–52.0)
Hemoglobin: 13.8 g/dL (ref 13.0–17.0)
Immature Granulocytes: 0 %
Lymphocytes Relative: 20 %
Lymphs Abs: 1.5 10*3/uL (ref 0.7–4.0)
MCH: 33.9 pg (ref 26.0–34.0)
MCHC: 34.3 g/dL (ref 30.0–36.0)
MCV: 98.8 fL (ref 80.0–100.0)
Monocytes Absolute: 0.7 10*3/uL (ref 0.1–1.0)
Monocytes Relative: 9 %
Neutro Abs: 5.4 10*3/uL (ref 1.7–7.7)
Neutrophils Relative %: 68 %
Platelets: 354 10*3/uL (ref 150–400)
RBC: 4.07 MIL/uL — ABNORMAL LOW (ref 4.22–5.81)
RDW: 13.3 % (ref 11.5–15.5)
WBC: 7.9 10*3/uL (ref 4.0–10.5)
nRBC: 0 % (ref 0.0–0.2)

## 2020-10-25 DIAGNOSIS — E7800 Pure hypercholesterolemia, unspecified: Secondary | ICD-10-CM | POA: Diagnosis not present

## 2020-10-25 DIAGNOSIS — Z79899 Other long term (current) drug therapy: Secondary | ICD-10-CM | POA: Diagnosis not present

## 2020-10-26 DIAGNOSIS — B353 Tinea pedis: Secondary | ICD-10-CM | POA: Diagnosis not present

## 2020-10-26 DIAGNOSIS — D2262 Melanocytic nevi of left upper limb, including shoulder: Secondary | ICD-10-CM | POA: Diagnosis not present

## 2020-10-26 DIAGNOSIS — L570 Actinic keratosis: Secondary | ICD-10-CM | POA: Diagnosis not present

## 2020-10-26 DIAGNOSIS — X32XXXA Exposure to sunlight, initial encounter: Secondary | ICD-10-CM | POA: Diagnosis not present

## 2020-10-26 DIAGNOSIS — D2261 Melanocytic nevi of right upper limb, including shoulder: Secondary | ICD-10-CM | POA: Diagnosis not present

## 2020-10-26 DIAGNOSIS — D225 Melanocytic nevi of trunk: Secondary | ICD-10-CM | POA: Diagnosis not present

## 2020-10-26 DIAGNOSIS — L821 Other seborrheic keratosis: Secondary | ICD-10-CM | POA: Diagnosis not present

## 2020-10-28 DIAGNOSIS — C61 Malignant neoplasm of prostate: Secondary | ICD-10-CM | POA: Diagnosis not present

## 2020-10-28 DIAGNOSIS — I10 Essential (primary) hypertension: Secondary | ICD-10-CM | POA: Diagnosis not present

## 2020-10-28 DIAGNOSIS — Z0000 Encounter for general adult medical examination without abnormal findings: Secondary | ICD-10-CM | POA: Diagnosis not present

## 2020-10-28 DIAGNOSIS — E7800 Pure hypercholesterolemia, unspecified: Secondary | ICD-10-CM | POA: Diagnosis not present

## 2020-10-28 DIAGNOSIS — D590 Drug-induced autoimmune hemolytic anemia: Secondary | ICD-10-CM | POA: Diagnosis not present

## 2020-10-28 DIAGNOSIS — D693 Immune thrombocytopenic purpura: Secondary | ICD-10-CM | POA: Diagnosis not present

## 2020-10-28 DIAGNOSIS — Z79899 Other long term (current) drug therapy: Secondary | ICD-10-CM | POA: Diagnosis not present

## 2020-10-28 DIAGNOSIS — Z1331 Encounter for screening for depression: Secondary | ICD-10-CM | POA: Diagnosis not present

## 2020-11-01 ENCOUNTER — Telehealth: Payer: Self-pay | Admitting: Pharmacy Technician

## 2020-12-03 ENCOUNTER — Encounter: Payer: Self-pay | Admitting: Internal Medicine

## 2020-12-03 ENCOUNTER — Telehealth: Payer: Self-pay | Admitting: *Deleted

## 2020-12-03 ENCOUNTER — Inpatient Hospital Stay: Payer: PPO | Admitting: Internal Medicine

## 2020-12-03 ENCOUNTER — Inpatient Hospital Stay: Payer: PPO | Attending: Internal Medicine

## 2020-12-03 ENCOUNTER — Inpatient Hospital Stay: Payer: PPO | Attending: Internal Medicine | Admitting: Internal Medicine

## 2020-12-03 DIAGNOSIS — D693 Immune thrombocytopenic purpura: Secondary | ICD-10-CM | POA: Insufficient documentation

## 2020-12-03 DIAGNOSIS — I10 Essential (primary) hypertension: Secondary | ICD-10-CM | POA: Diagnosis not present

## 2020-12-03 DIAGNOSIS — Z79899 Other long term (current) drug therapy: Secondary | ICD-10-CM | POA: Diagnosis not present

## 2020-12-03 LAB — CBC WITH DIFFERENTIAL/PLATELET
Abs Immature Granulocytes: 0.03 10*3/uL (ref 0.00–0.07)
Basophils Absolute: 0 10*3/uL (ref 0.0–0.1)
Basophils Relative: 1 %
Eosinophils Absolute: 0.2 10*3/uL (ref 0.0–0.5)
Eosinophils Relative: 2 %
HCT: 39.7 % (ref 39.0–52.0)
Hemoglobin: 13.5 g/dL (ref 13.0–17.0)
Immature Granulocytes: 0 %
Lymphocytes Relative: 16 %
Lymphs Abs: 1.4 10*3/uL (ref 0.7–4.0)
MCH: 33.6 pg (ref 26.0–34.0)
MCHC: 34 g/dL (ref 30.0–36.0)
MCV: 98.8 fL (ref 80.0–100.0)
Monocytes Absolute: 0.9 10*3/uL (ref 0.1–1.0)
Monocytes Relative: 10 %
Neutro Abs: 6.1 10*3/uL (ref 1.7–7.7)
Neutrophils Relative %: 71 %
Platelets: 289 10*3/uL (ref 150–400)
RBC: 4.02 MIL/uL — ABNORMAL LOW (ref 4.22–5.81)
RDW: 13.1 % (ref 11.5–15.5)
WBC: 8.7 10*3/uL (ref 4.0–10.5)
nRBC: 0 % (ref 0.0–0.2)

## 2020-12-03 LAB — COMPREHENSIVE METABOLIC PANEL
ALT: 15 U/L (ref 0–44)
AST: 18 U/L (ref 15–41)
Albumin: 3.7 g/dL (ref 3.5–5.0)
Alkaline Phosphatase: 56 U/L (ref 38–126)
Anion gap: 9 (ref 5–15)
BUN: 17 mg/dL (ref 8–23)
CO2: 24 mmol/L (ref 22–32)
Calcium: 8.6 mg/dL — ABNORMAL LOW (ref 8.9–10.3)
Chloride: 100 mmol/L (ref 98–111)
Creatinine, Ser: 0.9 mg/dL (ref 0.61–1.24)
GFR, Estimated: >60 mL/min (ref >60)
Glucose, Bld: 98 mg/dL (ref 70–99)
Potassium: 4 mmol/L (ref 3.5–5.1)
Sodium: 133 mmol/L — ABNORMAL LOW (ref 135–145)
Total Bilirubin: 0.5 mg/dL (ref 0.3–1.2)
Total Protein: 6.6 g/dL (ref 6.5–8.1)

## 2020-12-03 NOTE — Progress Notes (Unsigned)
[facility] OFFICE PROGRESS NOTE  Patient Care Team: Derinda Late, MD as PCP - General (Family Medicine)   SUMMARY OF ONCOLOGIC HISTORY:  #FEB 2016-  ITP  s/p Prednisone; OCT 2016- Relapse of ITP- Prednisone; JAN 13th- START RITUXAN q W x4 [finished Feb 8th]; March 10th 2017- N-plate q W- good response; Dec 2018- promacta 25 mg qOD.   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   # Hx of nose bleeds  INTERVAL HISTORY:  [name] with a history of autoimmune hemolytic anemia/ITP is here for follow-up.  Patient states that he fell leaning over sometime in July 2021.  He has since followed up with his PCP.  Otherwise denies any nausea vomiting headaches.  No easy bruising or bleeding.  Continues to plan CellCept/Promacta.  Continues to have pain/joint pains.  Review of Systems  Constitutional: Negative for chills, diaphoresis, fever, malaise/fatigue and weight loss.  HENT: Negative for nosebleeds and sore throat.   Eyes: Negative for double vision.  Respiratory: Negative for cough, hemoptysis, sputum production, shortness of breath and wheezing.   Cardiovascular: Negative for chest pain, palpitations, orthopnea and leg swelling.  Gastrointestinal: Negative for abdominal pain, blood in stool, constipation, diarrhea, heartburn, melena, nausea and vomiting.  Genitourinary: Negative for dysuria, frequency and urgency.  Musculoskeletal: Positive for back pain and joint pain.  Skin: Negative.  Negative for itching and rash.  Neurological: Negative for dizziness, tingling, focal weakness, weakness and headaches.  Endo/Heme/Allergies: Does not bruise/bleed easily.  Psychiatric/Behavioral: Negative for depression. The patient is not nervous/anxious and does not have insomnia.     PAST MEDICAL HISTORY :  Past Medical History:  Diagnosis Date  . Autoimmune hemolytic anemia   .  Dehydration   . Detached retina   . Hypercholesteremia   . Hypertension   . Insomnia   . 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 Leukocytosis   . Prostate cancer ([place])   . Shingles     PAST SURGICAL HISTORY :   Past Surgical History:  Procedure Laterality Date  . artificial left eye    . EYE SURGERY    . HERNIA REPAIR    . JOINT REPLACEMENT    . PROSTATE SURGERY      FAMILY HISTORY :   Family History  Problem Relation Age of Onset  . Cancer Mother   . Multiple sclerosis Father     SOCIAL HISTORY:   Social History   Tobacco Use  . Smoking status: Never Smoker  . Smokeless tobacco: Never Used  Vaping Use  . Vaping Use: Never used  Substance Use Topics  . Alcohol use: Yes  . Drug use: No    ALLERGIES:  is allergic to cephalexin and hydrocodone-acetaminophen.  MEDICATIONS:  Current Outpatient Medications  Medication Sig Dispense Refill  . acetaminophen (TYLENOL) 500 MG tablet Take 500 mg by mouth every 6 (six) hours as needed for mild pain. Reported on 01/24/2016    . citalopram (CELEXA) 10 MG tablet Take 10 mg by mouth daily.    . Cyanocobalamin (RA VITAMIN B-12 TR) 1000 MCG TBCR Take 1,000 mcg by mouth daily.    Marland Kitchen eltrombopag (PROMACTA) 25 MG tablet TAKE 1 TABLET (25 MG TOTAL) BY MOUTH EVERY OTHER DAY. TAKE ON AN EMPTY STOMACH, 1 HOUR BEFORE A MEAL OR 2 HOURS AFTER. 30 tablet 4  . feeding supplement, ENSURE ENLIVE, (ENSURE ENLIVE) LIQD Take 237 mLs by mouth  2 (two) times daily between meals. 237 mL 12  . gabapentin (NEURONTIN) 100 MG capsule Take 100 mg by mouth at bedtime. Reported on 12/03/2015    . lisinopril (PRINIVIL,ZESTRIL) 5 MG tablet Take 10 mg by mouth daily.     . meclizine (ANTIVERT) 25 MG tablet Take 1 tablet by mouth as needed for dizziness.    . mycophenolate (CELLCEPT) 500 MG tablet TAKE TWO TABLETS EVERY MORNING AND TAKE ONE TABLET EVERY EVENING 90 tablet 3  . omeprazole (PRILOSEC) 40 MG capsule TAKE 1 CAPSULE BY MOUTH DAILY USUALLY  30 MINUTES BEFORE BREAKFAST 90 capsule 2  . polyethylene glycol (MIRALAX / GLYCOLAX) packet Take 17 g by mouth daily as needed for mild constipation. 14 each 0  . polyvinyl alcohol (LIQUIFILM TEARS) 1.4 % ophthalmic solution Place 2 drops into the left eye 3 (three) times daily as needed for dry eyes. 15 mL 0  . pravastatin (PRAVACHOL) 20 MG tablet Take 20 mg by mouth at bedtime.      No current facility-administered medications for this visit.    PHYSICAL EXAMINATION:   There were no vitals taken for this visit.  There were no vitals filed for this visit.  Physical Exam HENT:     Head: Normocephalic and atraumatic.     Mouth/Throat:     Pharynx: No oropharyngeal exudate.  Eyes:     Pupils: Pupils are equal, round, and reactive to light.  Cardiovascular:     Rate and Rhythm: Normal rate and regular rhythm.  Pulmonary:     Effort: No respiratory distress.     Breath sounds: No wheezing.  Abdominal:     General: Bowel sounds are normal. There is no distension.     Palpations: Abdomen is soft. There is no mass.     Tenderness: There is no abdominal tenderness. There is no guarding or rebound.  Musculoskeletal:        General: No tenderness. Normal range of motion.     Cervical back: Normal range of motion and neck supple.  Skin:    General: Skin is warm.  Neurological:     Mental Status: He is alert and oriented to person, place, and time.  Psychiatric:        Mood and Affect: Affect normal.      LABORATORY DATA:  I have reviewed the data as listed    Component Value Date/Time   NA 138 08/02/2020 1322   NA 131 (L) 03/17/2015 1425   K 4.1 08/02/2020 1322   K 3.9 03/17/2015 1425   CL 104 08/02/2020 1322   CL 101 03/17/2015 1425   CO2 27 08/02/2020 1322   CO2 26 03/17/2015 1425   GLUCOSE 104 (H) 08/02/2020 1322   GLUCOSE 161 (H) 03/17/2015 1425   BUN 18 08/02/2020 1322   BUN 17 03/17/2015 1425   CREATININE 0.84 08/02/2020 1322   CREATININE 0.90 03/17/2015 1425    CALCIUM 8.5 (L) 08/02/2020 1322   CALCIUM 8.5 (L) 03/17/2015 1425   PROT 6.5 08/02/2020 1322   PROT 6.7 03/17/2015 1425   ALBUMIN 3.7 08/02/2020 1322   ALBUMIN 3.7 03/17/2015 1425   AST 20 08/02/2020 1322   AST 25 03/17/2015 1425   ALT 17 08/02/2020 1322   ALT 23 03/17/2015 1425   ALKPHOS 48 08/02/2020 1322   ALKPHOS 44 03/17/2015 1425   BILITOT 0.8 08/02/2020 1322   BILITOT 0.6 03/17/2015 1425   GFRNONAA >60 08/02/2020 1322   GFRNONAA >60 03/17/2015 1425  GFRAA >60 08/02/2020 1322   GFRAA >60 03/17/2015 1425    No results found for: SPEP, UPEP  Lab Results  Component Value Date   WBC 7.9 08/30/2020   NEUTROABS 5.4 08/30/2020   HGB 13.8 08/30/2020   HCT 40.2 08/30/2020   MCV 98.8 08/30/2020   PLT 354 08/30/2020      Chemistry      Component Value Date/Time   NA 138 08/02/2020 1322   NA 131 (L) 03/17/2015 1425   K 4.1 08/02/2020 1322   K 3.9 03/17/2015 1425   CL 104 08/02/2020 1322   CL 101 03/17/2015 1425   CO2 27 08/02/2020 1322   CO2 26 03/17/2015 1425   BUN 18 08/02/2020 1322   BUN 17 03/17/2015 1425   CREATININE 0.84 08/02/2020 1322   CREATININE 0.90 03/17/2015 1425      Component Value Date/Time   CALCIUM 8.5 (L) 08/02/2020 1322   CALCIUM 8.5 (L) 03/17/2015 1425   ALKPHOS 48 08/02/2020 1322   ALKPHOS 44 03/17/2015 1425   AST 20 08/02/2020 1322   AST 25 03/17/2015 1425   ALT 17 08/02/2020 1322   ALT 23 03/17/2015 1425   BILITOT 0.8 08/02/2020 1322   BILITOT 0.6 03/17/2015 1425       ASSESSMENT & PLAN:   Idiopathic thrombocytopenic purpura (HCC) #Chronic ITP currently on  Promacta 25 mg every other day.  Platelets 293- STABLE.   # Hemolytic anemia hemoglobin 13.2/LDH normal.  Continue CellCept 1000 milligrams in the morning 500 mg at night-STABLE  # Hypocalcemia- 8.5; recommend vit D 1000 unit/day.   # Hx of fall/ sec to leaning- no vertigo [Dr.McQueen]- ? Gait instability-if not improved refer to PCP.  # DISPOSITION:labs print out #  labs q 4 weeks- cbc;  # follow up in 4 months/labs-cbc/cmp- Dr.B      [name], MD 12/03/2020 11:08 AM

## 2020-12-03 NOTE — Assessment & Plan Note (Signed)
#  Chronic ITP currently on  Promacta 25 mg every other day.  Platelets 293- STABLE.   # Hemolytic anemia hemoglobin 13.2/LDH normal.  Continue CellCept 1000 milligrams in the morning 500 mg at night-STABLE  # Hypocalcemia- 8.5; recommend vit D 1000 unit/day.   # Hx of fall/ sec to leaning- no vertigo [Dr.McQueen]- ? Gait instability-if not improved refer to PCP.  # DISPOSITION:labs print out # labs q 4 weeks- cbc;  # follow up in 4 months/labs-cbc/cmp- Dr.B  

## 2020-12-03 NOTE — Assessment & Plan Note (Signed)
#  Chronic ITP currently on  Promacta 25 mg every other day.  Platelets 289- STABLE.   # Hemolytic anemia hemoglobin 13.2/LDH normal.  Continue CellCept 1000 milligrams in the morning 500 mg at night- STABLE  # Hypocalcemia- 8.6; recommend vit D 1000 unit/day.   # DISPOSITION:labs print out # labs q 4 weeks- cbc;  # follow up in 4 months/labs-cbc/cmp- Dr.B  

## 2020-12-03 NOTE — Telephone Encounter (Signed)
Stephanie/Jennifer.  Patient had to leave his MD apt today with Dr. B due to apt with Dr. Ubaldo Glassing. I cnl the patient's md apt today with Dr. Jacinto Reap -  Please schedule the apts below.   # DISPOSITION: # labs q 4 weeks- cbc # follow up in 4 months/labs-cbc/cmp- Dr.B

## 2020-12-03 NOTE — Progress Notes (Unsigned)
Patient here for follow up. No complaints today patients heart rate was low 38 and 37 on recheck. O2 level 98

## 2020-12-03 NOTE — Progress Notes (Signed)
[place] OFFICE PROGRESS NOTE  Patient Care Team: Derinda Late, MD as PCP - General (Family Medicine)   SUMMARY OF ONCOLOGIC HISTORY:  #FEB 2016-  ITP  s/p Prednisone; OCT 2016- Relapse of ITP- Prednisone; JAN 13th- START RITUXAN q W x4 [finished Feb 8th]; March 10th 2017- N-plate q W- good response; Dec 2018- promacta 25 mg qOD.   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   # Hx of nose bleeds  INTERVAL HISTORY:  [name] with a history of autoimmune hemolytic anemia/ITP is here for follow-up.   [name]  Mild joint pains but not any worse.  He continues to be fairly active for his age.  No nausea no vomiting.  No blood in stools or black or stools.  Review of Systems  Constitutional: Negative for chills, diaphoresis, fever, malaise/fatigue and weight loss.  HENT: Negative for nosebleeds and sore throat.   Eyes: Negative for double vision.  Respiratory: Negative for cough, hemoptysis, sputum production, shortness of breath and wheezing.   Cardiovascular: Negative for chest pain, palpitations, orthopnea and leg swelling.  Gastrointestinal: Negative for abdominal pain, blood in stool, constipation, diarrhea, heartburn, melena, nausea and vomiting.  Genitourinary: Negative for dysuria, frequency and urgency.  Musculoskeletal: Positive for back pain and joint pain.  Skin: Negative.  Negative for itching and rash.  Neurological: Negative for dizziness, tingling, focal weakness, weakness and headaches.  Endo/Heme/Allergies: Does not bruise/bleed easily.  Psychiatric/Behavioral: Negative for depression. The patient is not nervous/anxious and does not have insomnia.     PAST MEDICAL HISTORY :  Past Medical History:  Diagnosis Date  . Autoimmune hemolytic anemia (HCC)   . Dehydration   . Detached retina   . Hypercholesteremia   .  Hypertension   . Insomnia   . 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 Leukocytosis   . Prostate cancer ([place])   . Shingles     PAST SURGICAL HISTORY :   Past Surgical History:  Procedure Laterality Date  . artificial left eye    . EYE SURGERY    . HERNIA REPAIR    . JOINT REPLACEMENT    . PROSTATE SURGERY      FAMILY HISTORY :   Family History  Problem Relation Age of Onset  . Cancer Mother   . Multiple sclerosis Father     SOCIAL HISTORY:   Social History   Tobacco Use  . Smoking status: Never Smoker  . Smokeless tobacco: Never Used  Vaping Use  . Vaping Use: Never used  Substance Use Topics  . Alcohol use: Yes  . Drug use: No    ALLERGIES:  is allergic to cephalexin and hydrocodone-acetaminophen.  MEDICATIONS:  Current Outpatient Medications  Medication Sig Dispense Refill  . acetaminophen (TYLENOL) 500 MG tablet Take 500 mg by mouth every 6 (six) hours as needed for mild pain. Reported on 01/24/2016    . citalopram (CELEXA) 10 MG tablet Take 10 mg by mouth daily.    . Cyanocobalamin 1000 MCG TBCR Take 1,000 mcg by mouth daily.    Marland Kitchen eltrombopag (PROMACTA) 25 MG tablet TAKE 1 TABLET (25 MG TOTAL) BY MOUTH EVERY OTHER DAY. TAKE ON AN EMPTY STOMACH, 1 HOUR BEFORE A MEAL OR 2 HOURS AFTER. 30 tablet 4  . feeding supplement, ENSURE ENLIVE, (ENSURE ENLIVE) LIQD Take 237 mLs by mouth 2 (two) times daily between meals. [place]  mL 12  . gabapentin (NEURONTIN) 100 MG capsule Take 100 mg by mouth at bedtime. Reported on 12/03/2015    . lisinopril (PRINIVIL,ZESTRIL) 5 MG tablet Take 10 mg by mouth daily.     . meclizine (ANTIVERT) 25 MG tablet Take 1 tablet by mouth as needed for dizziness.    . mycophenolate (CELLCEPT) 500 MG tablet TAKE TWO TABLETS EVERY MORNING AND TAKE ONE TABLET EVERY EVENING 90 tablet 3  . omeprazole (PRILOSEC) 40 MG capsule TAKE 1 CAPSULE BY MOUTH DAILY USUALLY 30 MINUTES BEFORE BREAKFAST 90 capsule 2  . polyethylene glycol (MIRALAX /  GLYCOLAX) packet Take 17 g by mouth daily as needed for mild constipation. 14 each 0  . polyvinyl alcohol (LIQUIFILM TEARS) 1.4 % ophthalmic solution Place 2 drops into the left eye 3 (three) times daily as needed for dry eyes. 15 mL 0  . pravastatin (PRAVACHOL) 20 MG tablet Take 20 mg by mouth at bedtime.      No current facility-administered medications for this visit.    PHYSICAL EXAMINATION:   There were no vitals taken for this visit.  There were no vitals filed for this visit.  Physical Exam HENT:     Head: Normocephalic and atraumatic.     Mouth/Throat:     Pharynx: No oropharyngeal exudate.  Eyes:     Pupils: Pupils are equal, round, and reactive to light.  Cardiovascular:     Rate and Rhythm: Normal rate and regular rhythm.  Pulmonary:     Effort: No respiratory distress.     Breath sounds: No wheezing.  Abdominal:     General: Bowel sounds are normal. There is no distension.     Palpations: Abdomen is soft. There is no mass.     Tenderness: There is no abdominal tenderness. There is no guarding or rebound.  Musculoskeletal:        General: No tenderness. Normal range of motion.     Cervical back: Normal range of motion and neck supple.  Skin:    General: Skin is warm.  Neurological:     Mental Status: He is alert and oriented to person, place, and time.  Psychiatric:        Mood and Affect: Affect normal.      LABORATORY DATA:  I have reviewed the data as listed    Component Value Date/Time   NA 133 (L) 12/03/2020 1337   NA 131 (L) 03/17/2015 1425   K 4.0 12/03/2020 1337   K 3.9 03/17/2015 1425   CL 100 12/03/2020 1337   CL 101 03/17/2015 1425   CO2 24 12/03/2020 1337   CO2 26 03/17/2015 1425   GLUCOSE 98 12/03/2020 1337   GLUCOSE 161 (H) 03/17/2015 1425   BUN 17 12/03/2020 1337   BUN 17 03/17/2015 1425   CREATININE 0.90 12/03/2020 1337   CREATININE 0.90 03/17/2015 1425   CALCIUM 8.6 (L) 12/03/2020 1337   CALCIUM 8.5 (L) 03/17/2015 1425   PROT  6.6 12/03/2020 1337   PROT 6.7 03/17/2015 1425   ALBUMIN 3.7 12/03/2020 1337   ALBUMIN 3.7 03/17/2015 1425   AST 18 12/03/2020 1337   AST 25 03/17/2015 1425   ALT 15 12/03/2020 1337   ALT 23 03/17/2015 1425   ALKPHOS 56 12/03/2020 1337   ALKPHOS 44 03/17/2015 1425   BILITOT 0.5 12/03/2020 1337   BILITOT 0.6 03/17/2015 1425   GFRNONAA >60 12/03/2020 1337   GFRNONAA >60 03/17/2015 1425   GFRAA >60 08/02/2020 1322   GFRAA >  60 03/17/2015 1425    No results found for: SPEP, UPEP  Lab Results  Component Value Date   WBC 8.7 12/03/2020   NEUTROABS 6.1 12/03/2020   HGB 13.5 12/03/2020   HCT 39.7 12/03/2020   MCV 98.8 12/03/2020   PLT 289 12/03/2020      Chemistry      Component Value Date/Time   NA 133 (L) 12/03/2020 1337   NA 131 (L) 03/17/2015 1425   K 4.0 12/03/2020 1337   K 3.9 03/17/2015 1425   CL 100 12/03/2020 1337   CL 101 03/17/2015 1425   CO2 24 12/03/2020 1337   CO2 26 03/17/2015 1425   BUN 17 12/03/2020 1337   BUN 17 03/17/2015 1425   CREATININE 0.90 12/03/2020 1337   CREATININE 0.90 03/17/2015 1425      Component Value Date/Time   CALCIUM 8.6 (L) 12/03/2020 1337   CALCIUM 8.5 (L) 03/17/2015 1425   ALKPHOS 56 12/03/2020 1337   ALKPHOS 44 03/17/2015 1425   AST 18 12/03/2020 1337   AST 25 03/17/2015 1425   ALT 15 12/03/2020 1337   ALT 23 03/17/2015 1425   BILITOT 0.5 12/03/2020 1337   BILITOT 0.6 03/17/2015 1425       ASSESSMENT & PLAN:   Idiopathic thrombocytopenic purpura (HCC) #Chronic ITP currently on  Promacta 25 mg every other day.  Platelets 289- STABLE.   # Hemolytic anemia hemoglobin 13.2/LDH normal.  Continue CellCept 1000 milligrams in the morning 500 mg at night-STABLE.   # Hypocalcemia- 8.6; recommend vit D 1000 unit/day.   # DISPOSITION:labs print out # labs q 4 weeks- cbc;  # follow up in 4 months/labs-cbc/cmp- Dr.B      [name], MD 12/06/2020 4:43 PM

## 2020-12-09 DIAGNOSIS — R059 Cough, unspecified: Secondary | ICD-10-CM | POA: Diagnosis not present

## 2020-12-09 DIAGNOSIS — D693 Immune thrombocytopenic purpura: Secondary | ICD-10-CM | POA: Diagnosis not present

## 2020-12-09 DIAGNOSIS — U071 COVID-19: Secondary | ICD-10-CM | POA: Diagnosis not present

## 2020-12-17 NOTE — Telephone Encounter (Signed)
Oral Oncology Patient Advocate Encounter  Received communication from [employer] that the patient's eligibility in the patient assistance program for Promacta was due for re-enrollment.    Patient brought their completed portion of the application to the office on 10/29/20.  MD portion was completed 10/19/20.  The renewal application has been completed and faxed in an effort to keep the patient's out of pocket expense for Promacta at $0.     Application completed and faxed to [phone].    [employer] phone number for follow up is [phone].    This encounter will be updated until final determination.  [place] Patient [place] Phone [phone] Fax [phone]

## 2020-12-17 NOTE — Telephone Encounter (Signed)
Oral Oncology Patient Advocate Encounter  Received notification from [employer] Patient Assistance program that patient has been successfully enrolled into their program to receive Promacta from the manufacturer at $0 out of pocket until 11/19/21.    I called and spoke with patient.  He knows we will have to re-apply.   Specialty Pharmacy that will dispense medication is [facility].  Patient knows to call the office with questions or concerns.   Oral Oncology Clinic will continue to follow.  [place] Patient [place] Phone [phone] Fax [phone] 12/17/2020 9:37 AM

## 2020-12-29 ENCOUNTER — Other Ambulatory Visit: Payer: Self-pay | Admitting: Internal Medicine

## 2020-12-29 DIAGNOSIS — D693 Immune thrombocytopenic purpura: Secondary | ICD-10-CM

## 2020-12-30 ENCOUNTER — Other Ambulatory Visit: Payer: Self-pay | Admitting: *Deleted

## 2020-12-30 DIAGNOSIS — D693 Immune thrombocytopenic purpura: Secondary | ICD-10-CM

## 2020-12-31 ENCOUNTER — Inpatient Hospital Stay: Payer: PPO | Attending: Internal Medicine

## 2020-12-31 DIAGNOSIS — Z79899 Other long term (current) drug therapy: Secondary | ICD-10-CM | POA: Insufficient documentation

## 2020-12-31 DIAGNOSIS — D589 Hereditary hemolytic anemia, unspecified: Secondary | ICD-10-CM | POA: Diagnosis not present

## 2020-12-31 DIAGNOSIS — I10 Essential (primary) hypertension: Secondary | ICD-10-CM | POA: Diagnosis not present

## 2020-12-31 DIAGNOSIS — D693 Immune thrombocytopenic purpura: Secondary | ICD-10-CM | POA: Insufficient documentation

## 2020-12-31 DIAGNOSIS — E7800 Pure hypercholesterolemia, unspecified: Secondary | ICD-10-CM | POA: Diagnosis not present

## 2020-12-31 DIAGNOSIS — Z862 Personal history of diseases of the blood and blood-forming organs and certain disorders involving the immune mechanism: Secondary | ICD-10-CM | POA: Diagnosis not present

## 2020-12-31 DIAGNOSIS — R55 Syncope and collapse: Secondary | ICD-10-CM | POA: Diagnosis not present

## 2020-12-31 LAB — CBC WITH DIFFERENTIAL/PLATELET
Abs Immature Granulocytes: 0.02 10*3/uL (ref 0.00–0.07)
Basophils Absolute: 0 10*3/uL (ref 0.0–0.1)
Basophils Relative: 0 %
Eosinophils Absolute: 0.2 10*3/uL (ref 0.0–0.5)
Eosinophils Relative: 2 %
HCT: 38.8 % — ABNORMAL LOW (ref 39.0–52.0)
Hemoglobin: 13.4 g/dL (ref 13.0–17.0)
Immature Granulocytes: 0 %
Lymphocytes Relative: 22 %
Lymphs Abs: 1.5 10*3/uL (ref 0.7–4.0)
MCH: 34 pg (ref 26.0–34.0)
MCHC: 34.5 g/dL (ref 30.0–36.0)
MCV: 98.5 fL (ref 80.0–100.0)
Monocytes Absolute: 0.7 10*3/uL (ref 0.1–1.0)
Monocytes Relative: 11 %
Neutro Abs: 4.3 10*3/uL (ref 1.7–7.7)
Neutrophils Relative %: 65 %
Platelets: 351 10*3/uL (ref 150–400)
RBC: 3.94 MIL/uL — ABNORMAL LOW (ref 4.22–5.81)
RDW: 13.2 % (ref 11.5–15.5)
WBC: 6.7 10*3/uL (ref 4.0–10.5)
nRBC: 0 % (ref 0.0–0.2)

## 2021-01-10 DIAGNOSIS — H40051 Ocular hypertension, right eye: Secondary | ICD-10-CM | POA: Diagnosis not present

## 2021-01-28 ENCOUNTER — Inpatient Hospital Stay: Payer: PPO | Attending: Internal Medicine

## 2021-01-28 DIAGNOSIS — D693 Immune thrombocytopenic purpura: Secondary | ICD-10-CM | POA: Diagnosis not present

## 2021-01-28 LAB — CBC WITH DIFFERENTIAL/PLATELET
Abs Immature Granulocytes: 0.03 10*3/uL (ref 0.00–0.07)
Basophils Absolute: 0 10*3/uL (ref 0.0–0.1)
Basophils Relative: 0 %
Eosinophils Absolute: 0.2 10*3/uL (ref 0.0–0.5)
Eosinophils Relative: 3 %
HCT: 39.9 % (ref 39.0–52.0)
Hemoglobin: 13.4 g/dL (ref 13.0–17.0)
Immature Granulocytes: 0 %
Lymphocytes Relative: 18 %
Lymphs Abs: 1.4 10*3/uL (ref 0.7–4.0)
MCH: 33.7 pg (ref 26.0–34.0)
MCHC: 33.6 g/dL (ref 30.0–36.0)
MCV: 100.3 fL — ABNORMAL HIGH (ref 80.0–100.0)
Monocytes Absolute: 0.6 10*3/uL (ref 0.1–1.0)
Monocytes Relative: 8 %
Neutro Abs: 5.5 10*3/uL (ref 1.7–7.7)
Neutrophils Relative %: 71 %
Platelets: 331 10*3/uL (ref 150–400)
RBC: 3.98 MIL/uL — ABNORMAL LOW (ref 4.22–5.81)
RDW: 13.1 % (ref 11.5–15.5)
WBC: 7.7 10*3/uL (ref 4.0–10.5)
nRBC: 0 % (ref 0.0–0.2)

## 2021-02-25 ENCOUNTER — Inpatient Hospital Stay: Payer: PPO | Attending: Internal Medicine

## 2021-02-25 DIAGNOSIS — D693 Immune thrombocytopenic purpura: Secondary | ICD-10-CM | POA: Insufficient documentation

## 2021-02-28 ENCOUNTER — Inpatient Hospital Stay: Payer: PPO

## 2021-02-28 ENCOUNTER — Other Ambulatory Visit: Payer: Self-pay

## 2021-02-28 DIAGNOSIS — D693 Immune thrombocytopenic purpura: Secondary | ICD-10-CM

## 2021-02-28 LAB — CBC WITH DIFFERENTIAL/PLATELET
Abs Immature Granulocytes: 0.02 10*3/uL (ref 0.00–0.07)
Basophils Absolute: 0 10*3/uL (ref 0.0–0.1)
Basophils Relative: 0 %
Eosinophils Absolute: 0.2 10*3/uL (ref 0.0–0.5)
Eosinophils Relative: 2 %
HCT: 39.5 % (ref 39.0–52.0)
Hemoglobin: 13.4 g/dL (ref 13.0–17.0)
Immature Granulocytes: 0 %
Lymphocytes Relative: 20 %
Lymphs Abs: 1.4 10*3/uL (ref 0.7–4.0)
MCH: 33.8 pg (ref 26.0–34.0)
MCHC: 33.9 g/dL (ref 30.0–36.0)
MCV: 99.5 fL (ref 80.0–100.0)
Monocytes Absolute: 0.7 10*3/uL (ref 0.1–1.0)
Monocytes Relative: 9 %
Neutro Abs: 5 10*3/uL (ref 1.7–7.7)
Neutrophils Relative %: 69 %
Platelets: 265 10*3/uL (ref 150–400)
RBC: 3.97 MIL/uL — ABNORMAL LOW (ref 4.22–5.81)
RDW: 13.3 % (ref 11.5–15.5)
WBC: 7.2 10*3/uL (ref 4.0–10.5)
nRBC: 0 % (ref 0.0–0.2)

## 2021-04-04 ENCOUNTER — Ambulatory Visit: Payer: PPO | Admitting: Internal Medicine

## 2021-04-04 ENCOUNTER — Other Ambulatory Visit: Payer: PPO

## 2021-04-26 DIAGNOSIS — Z79899 Other long term (current) drug therapy: Secondary | ICD-10-CM | POA: Diagnosis not present

## 2021-04-26 DIAGNOSIS — E7800 Pure hypercholesterolemia, unspecified: Secondary | ICD-10-CM | POA: Diagnosis not present

## 2021-05-02 DIAGNOSIS — E7800 Pure hypercholesterolemia, unspecified: Secondary | ICD-10-CM | POA: Diagnosis not present

## 2021-05-02 DIAGNOSIS — Z79899 Other long term (current) drug therapy: Secondary | ICD-10-CM | POA: Diagnosis not present

## 2021-05-02 DIAGNOSIS — C61 Malignant neoplasm of prostate: Secondary | ICD-10-CM | POA: Diagnosis not present

## 2021-05-02 DIAGNOSIS — D693 Immune thrombocytopenic purpura: Secondary | ICD-10-CM | POA: Diagnosis not present

## 2021-05-02 DIAGNOSIS — I10 Essential (primary) hypertension: Secondary | ICD-10-CM | POA: Diagnosis not present

## 2021-05-02 DIAGNOSIS — K219 Gastro-esophageal reflux disease without esophagitis: Secondary | ICD-10-CM | POA: Diagnosis not present

## 2021-05-03 ENCOUNTER — Encounter: Payer: Self-pay | Admitting: Internal Medicine

## 2021-05-03 ENCOUNTER — Inpatient Hospital Stay: Payer: PPO | Admitting: Internal Medicine

## 2021-05-03 ENCOUNTER — Inpatient Hospital Stay: Payer: PPO | Attending: Internal Medicine

## 2021-05-03 DIAGNOSIS — D693 Immune thrombocytopenic purpura: Secondary | ICD-10-CM | POA: Insufficient documentation

## 2021-05-03 DIAGNOSIS — Z79899 Other long term (current) drug therapy: Secondary | ICD-10-CM | POA: Insufficient documentation

## 2021-05-03 DIAGNOSIS — Z8546 Personal history of malignant neoplasm of prostate: Secondary | ICD-10-CM | POA: Insufficient documentation

## 2021-05-03 DIAGNOSIS — D591 Autoimmune hemolytic anemia, unspecified: Secondary | ICD-10-CM | POA: Diagnosis not present

## 2021-05-03 DIAGNOSIS — I10 Essential (primary) hypertension: Secondary | ICD-10-CM | POA: Diagnosis not present

## 2021-05-03 DIAGNOSIS — E7800 Pure hypercholesterolemia, unspecified: Secondary | ICD-10-CM | POA: Insufficient documentation

## 2021-05-03 LAB — CBC WITH DIFFERENTIAL/PLATELET
Abs Immature Granulocytes: 0.02 10*3/uL (ref 0.00–0.07)
Basophils Absolute: 0 10*3/uL (ref 0.0–0.1)
Basophils Relative: 0 %
Eosinophils Absolute: 0.2 10*3/uL (ref 0.0–0.5)
Eosinophils Relative: 2 %
HCT: 39.8 % (ref 39.0–52.0)
Hemoglobin: 13.7 g/dL (ref 13.0–17.0)
Immature Granulocytes: 0 %
Lymphocytes Relative: 14 %
Lymphs Abs: 1.4 10*3/uL (ref 0.7–4.0)
MCH: 34 pg (ref 26.0–34.0)
MCHC: 34.4 g/dL (ref 30.0–36.0)
MCV: 98.8 fL (ref 80.0–100.0)
Monocytes Absolute: 0.7 10*3/uL (ref 0.1–1.0)
Monocytes Relative: 8 %
Neutro Abs: 7.3 10*3/uL (ref 1.7–7.7)
Neutrophils Relative %: 76 %
Platelets: 321 10*3/uL (ref 150–400)
RBC: 4.03 MIL/uL — ABNORMAL LOW (ref 4.22–5.81)
RDW: 13.3 % (ref 11.5–15.5)
WBC: 9.6 10*3/uL (ref 4.0–10.5)
nRBC: 0 % (ref 0.0–0.2)

## 2021-05-03 LAB — COMPREHENSIVE METABOLIC PANEL
ALT: 21 U/L (ref 0–44)
AST: 23 U/L (ref 15–41)
Albumin: 3.9 g/dL (ref 3.5–5.0)
Alkaline Phosphatase: 58 U/L (ref 38–126)
Anion gap: 8 (ref 5–15)
BUN: 15 mg/dL (ref 8–23)
CO2: 28 mmol/L (ref 22–32)
Calcium: 8.7 mg/dL — ABNORMAL LOW (ref 8.9–10.3)
Chloride: 99 mmol/L (ref 98–111)
Creatinine, Ser: 0.89 mg/dL (ref 0.61–1.24)
GFR, Estimated: >60 mL/min (ref >60)
Glucose, Bld: 99 mg/dL (ref 70–99)
Potassium: 4.5 mmol/L (ref 3.5–5.1)
Sodium: 135 mmol/L (ref 135–145)
Total Bilirubin: 0.6 mg/dL (ref 0.3–1.2)
Total Protein: 6.8 g/dL (ref 6.5–8.1)

## 2021-05-03 NOTE — Progress Notes (Signed)
[place] OFFICE PROGRESS NOTE  Patient Care Team: Derinda Late, MD as PCP - General (Family Medicine)   SUMMARY OF ONCOLOGIC HISTORY:  #FEB 2016-  ITP  s/p Prednisone; OCT 2016- Relapse of ITP- Prednisone; JAN 13th- START RITUXAN q W x4 [finished Feb 8th]; March 10th 2017- N-plate q W- good response; Dec 2018- promacta 25 mg qOD.   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   # Hx of nose bleeds  INTERVAL HISTORY:  [name] with a history of autoimmune hemolytic anemia/ITP is here for follow-up.    [place] which seems a mechanical/fell in the bathroom.  Did not hit the head.  Denies any easy bruising or bleeding.  Chronic mild joint pains.  No nausea or vomiting.  Review of Systems  Constitutional:  Negative for chills, diaphoresis, fever, malaise/fatigue and weight loss.  HENT:  Negative for nosebleeds and sore throat.   Eyes:  Negative for double vision.  Respiratory:  Negative for cough, hemoptysis, sputum production, shortness of breath and wheezing.   Cardiovascular:  Negative for chest pain, palpitations, orthopnea and leg swelling.  Gastrointestinal:  Negative for abdominal pain, blood in stool, constipation, diarrhea, heartburn, melena, nausea and vomiting.  Genitourinary:  Negative for dysuria, frequency and urgency.  Musculoskeletal:  Positive for back pain and joint pain.  Skin: Negative.  Negative for itching and rash.  Neurological:  Negative for dizziness, tingling, focal weakness, weakness and headaches.  Endo/Heme/Allergies:  Does not bruise/bleed easily.  Psychiatric/Behavioral:  Negative for depression. The patient is not nervous/anxious and does not have insomnia.    PAST MEDICAL HISTORY :  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   Leukocytosis    Prostate cancer ([place])    Shingles     PAST SURGICAL HISTORY :   Past Surgical History:  Procedure Laterality Date   artificial left eye     EYE SURGERY     HERNIA REPAIR     JOINT REPLACEMENT     PROSTATE SURGERY      FAMILY HISTORY :   Family History  Problem Relation Age of Onset   Cancer Mother    Multiple sclerosis Father     SOCIAL HISTORY:   Social History   Tobacco Use   Smoking status: Never   Smokeless tobacco: Never  Vaping Use   Vaping Use: Never used  Substance Use Topics   Alcohol use: Yes   Drug use: No    ALLERGIES:  is allergic to cephalexin and hydrocodone-acetaminophen.  MEDICATIONS:  Current Outpatient Medications  Medication Sig Dispense Refill   acetaminophen (TYLENOL) 500 MG tablet Take 500 mg by mouth every 6 (six) hours as needed for mild pain. Reported on 01/24/2016     citalopram (CELEXA) 10 MG tablet Take 10 mg by mouth daily.     Cyanocobalamin 1000 MCG TBCR Take 1,000 mcg by mouth daily.     eltrombopag (PROMACTA) 25 MG tablet TAKE 1 TABLET (25 MG TOTAL) BY MOUTH EVERY OTHER DAY. TAKE ON AN EMPTY STOMACH, 1 HOUR BEFORE A MEAL OR 2 HOURS AFTER. 30 tablet 4   lisinopril (PRINIVIL,ZESTRIL) 5 MG tablet Take 10 mg by mouth daily.  meclizine (ANTIVERT) 25 MG tablet Take 1 tablet by mouth as needed for dizziness.     mycophenolate (CELLCEPT) 500 MG tablet TAKE 2 TABLETS EVERY MORNING AND TAKE 1 TABLET EVERY EVENING 90 tablet 3   omeprazole (PRILOSEC) 40 MG capsule TAKE 1 CAPSULE BY MOUTH DAILY USUALLY 30 MINUTES BEFORE BREAKFAST 90 capsule 2   polyethylene glycol (MIRALAX / GLYCOLAX) packet Take 17 g by mouth daily as needed for mild constipation. 14 each 0   polyvinyl alcohol (LIQUIFILM TEARS) 1.4 % ophthalmic solution Place 2 drops into the left eye 3 (three) times daily as needed for dry eyes. 15 mL 0   pravastatin (PRAVACHOL) 20 MG  tablet Take 20 mg by mouth at bedtime.      bimatoprost (LUMIGAN) 0.03 % ophthalmic solution Place 1 drop into the right eye at bedtime.     feeding supplement, ENSURE ENLIVE, (ENSURE ENLIVE) LIQD Take 237 mLs by mouth 2 (two) times daily between meals. (Patient not taking: Reported on 05/03/2021) 237 mL 12   No current facility-administered medications for this visit.    PHYSICAL EXAMINATION:   BP 131/89 (BP Location: Right Arm, Patient Position: Sitting, Cuff Size: Normal)   Pulse 73   Temp 98.5 F (36.9 C) (Oral)   Resp 16   Ht 5\' 8"  (1.727 m)   Wt 174 lb (78.9 kg)   SpO2 97%   BMI 26.46 kg/m   Filed Weights   05/03/21 1453  Weight: 174 lb (78.9 kg)    Physical Exam HENT:     Head: Normocephalic and atraumatic.     Mouth/Throat:     Pharynx: No oropharyngeal exudate.  Eyes:     Pupils: Pupils are equal, round, and reactive to light.  Cardiovascular:     Rate and Rhythm: Normal rate and regular rhythm.  Pulmonary:     Effort: No respiratory distress.     Breath sounds: No wheezing.  Abdominal:     General: Bowel sounds are normal. There is no distension.     Palpations: Abdomen is soft. There is no mass.     Tenderness: no abdominal tenderness There is no guarding or rebound.  Musculoskeletal:        General: No tenderness. Normal range of motion.     Cervical back: Normal range of motion and neck supple.  Skin:    General: Skin is warm.  Neurological:     Mental Status: He is alert and oriented to person, place, and time.  Psychiatric:        Mood and Affect: Affect normal.     LABORATORY DATA:  I have reviewed the data as listed    Component Value Date/Time   NA 135 05/03/2021 1442   NA 131 (L) 03/17/2015 1425   K 4.5 05/03/2021 1442   K 3.9 03/17/2015 1425   CL 99 05/03/2021 1442   CL 101 03/17/2015 1425   CO2 28 05/03/2021 1442   CO2 26 03/17/2015 1425   GLUCOSE 99 05/03/2021 1442   GLUCOSE 161 (H) 03/17/2015 1425   BUN 15 05/03/2021 1442    BUN 17 03/17/2015 1425   CREATININE 0.89 05/03/2021 1442   CREATININE 0.90 03/17/2015 1425   CALCIUM 8.7 (L) 05/03/2021 1442   CALCIUM 8.5 (L) 03/17/2015 1425   PROT 6.8 05/03/2021 1442   PROT 6.7 03/17/2015 1425   ALBUMIN 3.9 05/03/2021 1442   ALBUMIN 3.7 03/17/2015 1425   AST 23 05/03/2021 1442   AST 25 03/17/2015 1425  ALT 21 05/03/2021 1442   ALT 23 03/17/2015 1425   ALKPHOS 58 05/03/2021 1442   ALKPHOS 44 03/17/2015 1425   BILITOT 0.6 05/03/2021 1442   BILITOT 0.6 03/17/2015 1425   GFRNONAA >60 05/03/2021 1442   GFRNONAA >60 03/17/2015 1425   GFRAA >60 08/02/2020 1322   GFRAA >60 03/17/2015 1425    No results found for: SPEP, UPEP  Lab Results  Component Value Date   WBC 9.6 05/03/2021   NEUTROABS 7.3 05/03/2021   HGB 13.7 05/03/2021   HCT 39.8 05/03/2021   MCV 98.8 05/03/2021   PLT 321 05/03/2021      Chemistry      Component Value Date/Time   NA 135 05/03/2021 1442   NA 131 (L) 03/17/2015 1425   K 4.5 05/03/2021 1442   K 3.9 03/17/2015 1425   CL 99 05/03/2021 1442   CL 101 03/17/2015 1425   CO2 28 05/03/2021 1442   CO2 26 03/17/2015 1425   BUN 15 05/03/2021 1442   BUN 17 03/17/2015 1425   CREATININE 0.89 05/03/2021 1442   CREATININE 0.90 03/17/2015 1425      Component Value Date/Time   CALCIUM 8.7 (L) 05/03/2021 1442   CALCIUM 8.5 (L) 03/17/2015 1425   ALKPHOS 58 05/03/2021 1442   ALKPHOS 44 03/17/2015 1425   AST 23 05/03/2021 1442   AST 25 03/17/2015 1425   ALT 21 05/03/2021 1442   ALT 23 03/17/2015 1425   BILITOT 0.6 05/03/2021 1442   BILITOT 0.6 03/17/2015 1425       ASSESSMENT & PLAN:   Idiopathic thrombocytopenic purpura (HCC) #Chronic ITP currently on  Promacta 25 mg every other day.  Platelets 289- STABLE.   # Hemolytic anemia hemoglobin 13.2/LDH normal.  Continue CellCept 1000 milligrams in the morning 500 mg at night- STABLE  # Hypocalcemia- 8.6; recommend vit D 1000 unit/day.   # DISPOSITION:labs print out # labs q 4 weeks-  cbc;  # follow up in 4 months/labs-cbc/cmp- Dr.B      [name], MD 05/15/2021 8:36 PM

## 2021-05-03 NOTE — Assessment & Plan Note (Addendum)
#  Chronic ITP currently on  Promacta 25 mg every other day.  Platelets 289- STABLE.   # Hemolytic anemia hemoglobin 13.2/LDH normal.  Continue CellCept 1000 milligrams in the morning 500 mg at night- STABLE  # Hypocalcemia- 8.6; recommend vit D 1000 unit/day.   # DISPOSITION:labs print out # labs q 4 weeks- cbc;  # follow up in 4 months/labs-cbc/cmp- Dr.B

## 2021-05-15 ENCOUNTER — Encounter: Payer: Self-pay | Admitting: Internal Medicine

## 2021-05-19 DIAGNOSIS — C61 Malignant neoplasm of prostate: Secondary | ICD-10-CM | POA: Diagnosis not present

## 2021-05-19 DIAGNOSIS — Z8546 Personal history of malignant neoplasm of prostate: Secondary | ICD-10-CM | POA: Diagnosis not present

## 2021-05-19 DIAGNOSIS — Z08 Encounter for follow-up examination after completed treatment for malignant neoplasm: Secondary | ICD-10-CM | POA: Diagnosis not present

## 2021-05-31 ENCOUNTER — Other Ambulatory Visit: Payer: Self-pay

## 2021-05-31 ENCOUNTER — Inpatient Hospital Stay: Payer: PPO | Attending: Internal Medicine

## 2021-05-31 DIAGNOSIS — D693 Immune thrombocytopenic purpura: Secondary | ICD-10-CM | POA: Diagnosis not present

## 2021-05-31 DIAGNOSIS — D591 Autoimmune hemolytic anemia, unspecified: Secondary | ICD-10-CM | POA: Diagnosis not present

## 2021-05-31 DIAGNOSIS — Z79899 Other long term (current) drug therapy: Secondary | ICD-10-CM | POA: Insufficient documentation

## 2021-05-31 LAB — COMPREHENSIVE METABOLIC PANEL WITH GFR
ALT: 17 U/L (ref 0–44)
AST: 23 U/L (ref 15–41)
Albumin: 3.8 g/dL (ref 3.5–5.0)
Alkaline Phosphatase: 57 U/L (ref 38–126)
Anion gap: 9 (ref 5–15)
BUN: 16 mg/dL (ref 8–23)
CO2: 23 mmol/L (ref 22–32)
Calcium: 8.7 mg/dL — ABNORMAL LOW (ref 8.9–10.3)
Chloride: 101 mmol/L (ref 98–111)
Creatinine, Ser: 0.92 mg/dL (ref 0.61–1.24)
GFR, Estimated: >60 mL/min (ref >60)
Glucose, Bld: 104 mg/dL — ABNORMAL HIGH (ref 70–99)
Potassium: 3.7 mmol/L (ref 3.5–5.1)
Sodium: 133 mmol/L — ABNORMAL LOW (ref 135–145)
Total Bilirubin: 0.7 mg/dL (ref 0.3–1.2)
Total Protein: 6.4 g/dL — ABNORMAL LOW (ref 6.5–8.1)

## 2021-05-31 LAB — CBC WITH DIFFERENTIAL/PLATELET
Abs Immature Granulocytes: 0.02 10*3/uL (ref 0.00–0.07)
Basophils Absolute: 0 10*3/uL (ref 0.0–0.1)
Basophils Relative: 0 %
Eosinophils Absolute: 0.1 10*3/uL (ref 0.0–0.5)
Eosinophils Relative: 2 %
HCT: 38.7 % — ABNORMAL LOW (ref 39.0–52.0)
Hemoglobin: 13.4 g/dL (ref 13.0–17.0)
Immature Granulocytes: 0 %
Lymphocytes Relative: 19 %
Lymphs Abs: 1.5 10*3/uL (ref 0.7–4.0)
MCH: 34.4 pg — ABNORMAL HIGH (ref 26.0–34.0)
MCHC: 34.6 g/dL (ref 30.0–36.0)
MCV: 99.2 fL (ref 80.0–100.0)
Monocytes Absolute: 0.7 10*3/uL (ref 0.1–1.0)
Monocytes Relative: 10 %
Neutro Abs: 5.2 10*3/uL (ref 1.7–7.7)
Neutrophils Relative %: 69 %
Platelets: 296 10*3/uL (ref 150–400)
RBC: 3.9 MIL/uL — ABNORMAL LOW (ref 4.22–5.81)
RDW: 13.2 % (ref 11.5–15.5)
WBC: 7.6 10*3/uL (ref 4.0–10.5)
nRBC: 0 % (ref 0.0–0.2)

## 2021-06-24 ENCOUNTER — Other Ambulatory Visit: Payer: Self-pay | Admitting: Internal Medicine

## 2021-06-24 DIAGNOSIS — D693 Immune thrombocytopenic purpura: Secondary | ICD-10-CM

## 2021-06-25 ENCOUNTER — Encounter: Payer: Self-pay | Admitting: Internal Medicine

## 2021-06-28 ENCOUNTER — Other Ambulatory Visit: Payer: Self-pay

## 2021-06-28 ENCOUNTER — Inpatient Hospital Stay: Payer: PPO | Attending: Internal Medicine

## 2021-06-28 DIAGNOSIS — R55 Syncope and collapse: Secondary | ICD-10-CM | POA: Diagnosis not present

## 2021-06-28 DIAGNOSIS — D693 Immune thrombocytopenic purpura: Secondary | ICD-10-CM | POA: Diagnosis not present

## 2021-06-28 DIAGNOSIS — Z79899 Other long term (current) drug therapy: Secondary | ICD-10-CM | POA: Diagnosis not present

## 2021-06-28 DIAGNOSIS — E7800 Pure hypercholesterolemia, unspecified: Secondary | ICD-10-CM | POA: Diagnosis not present

## 2021-06-28 DIAGNOSIS — D591 Autoimmune hemolytic anemia, unspecified: Secondary | ICD-10-CM | POA: Insufficient documentation

## 2021-06-28 DIAGNOSIS — I10 Essential (primary) hypertension: Secondary | ICD-10-CM | POA: Diagnosis not present

## 2021-06-28 LAB — CBC WITH DIFFERENTIAL/PLATELET
Abs Immature Granulocytes: 0.01 10*3/uL (ref 0.00–0.07)
Basophils Absolute: 0 10*3/uL (ref 0.0–0.1)
Basophils Relative: 1 %
Eosinophils Absolute: 0.1 10*3/uL (ref 0.0–0.5)
Eosinophils Relative: 2 %
HCT: 41.6 % (ref 39.0–52.0)
Hemoglobin: 14 g/dL (ref 13.0–17.0)
Immature Granulocytes: 0 %
Lymphocytes Relative: 16 %
Lymphs Abs: 1.3 10*3/uL (ref 0.7–4.0)
MCH: 34 pg (ref 26.0–34.0)
MCHC: 33.7 g/dL (ref 30.0–36.0)
MCV: 101 fL — ABNORMAL HIGH (ref 80.0–100.0)
Monocytes Absolute: 0.7 10*3/uL (ref 0.1–1.0)
Monocytes Relative: 9 %
Neutro Abs: 5.9 10*3/uL (ref 1.7–7.7)
Neutrophils Relative %: 72 %
Platelets: 330 10*3/uL (ref 150–400)
RBC: 4.12 MIL/uL — ABNORMAL LOW (ref 4.22–5.81)
RDW: 13.2 % (ref 11.5–15.5)
WBC: 8.1 10*3/uL (ref 4.0–10.5)
nRBC: 0 % (ref 0.0–0.2)

## 2021-07-11 DIAGNOSIS — H40051 Ocular hypertension, right eye: Secondary | ICD-10-CM | POA: Diagnosis not present

## 2021-07-26 ENCOUNTER — Inpatient Hospital Stay: Payer: PPO | Attending: Internal Medicine

## 2021-07-26 DIAGNOSIS — D591 Autoimmune hemolytic anemia, unspecified: Secondary | ICD-10-CM | POA: Insufficient documentation

## 2021-07-26 DIAGNOSIS — D693 Immune thrombocytopenic purpura: Secondary | ICD-10-CM | POA: Diagnosis not present

## 2021-07-26 LAB — CBC WITH DIFFERENTIAL/PLATELET
Abs Immature Granulocytes: 0.03 10*3/uL (ref 0.00–0.07)
Basophils Absolute: 0 10*3/uL (ref 0.0–0.1)
Basophils Relative: 0 %
Eosinophils Absolute: 0.2 10*3/uL (ref 0.0–0.5)
Eosinophils Relative: 2 %
HCT: 40.9 % (ref 39.0–52.0)
Hemoglobin: 13.8 g/dL (ref 13.0–17.0)
Immature Granulocytes: 0 %
Lymphocytes Relative: 17 %
Lymphs Abs: 1.5 10*3/uL (ref 0.7–4.0)
MCH: 34 pg (ref 26.0–34.0)
MCHC: 33.7 g/dL (ref 30.0–36.0)
MCV: 100.7 fL — ABNORMAL HIGH (ref 80.0–100.0)
Monocytes Absolute: 0.7 10*3/uL (ref 0.1–1.0)
Monocytes Relative: 8 %
Neutro Abs: 6.6 10*3/uL (ref 1.7–7.7)
Neutrophils Relative %: 73 %
Platelets: 331 10*3/uL (ref 150–400)
RBC: 4.06 MIL/uL — ABNORMAL LOW (ref 4.22–5.81)
RDW: 13.2 % (ref 11.5–15.5)
WBC: 9 10*3/uL (ref 4.0–10.5)
nRBC: 0 % (ref 0.0–0.2)

## 2021-08-19 ENCOUNTER — Other Ambulatory Visit: Payer: Self-pay | Admitting: *Deleted

## 2021-08-19 DIAGNOSIS — D693 Immune thrombocytopenic purpura: Secondary | ICD-10-CM

## 2021-08-24 ENCOUNTER — Inpatient Hospital Stay: Payer: PPO | Admitting: Internal Medicine

## 2021-08-24 ENCOUNTER — Inpatient Hospital Stay: Payer: PPO

## 2021-09-02 ENCOUNTER — Inpatient Hospital Stay: Payer: PPO | Attending: Internal Medicine

## 2021-09-02 ENCOUNTER — Inpatient Hospital Stay (HOSPITAL_BASED_OUTPATIENT_CLINIC_OR_DEPARTMENT_OTHER): Payer: PPO | Admitting: Internal Medicine

## 2021-09-02 ENCOUNTER — Encounter: Payer: Self-pay | Admitting: Internal Medicine

## 2021-09-02 ENCOUNTER — Other Ambulatory Visit: Payer: Self-pay

## 2021-09-02 VITALS — BP 150/79 | HR 69 | Temp 98.5°F | Resp 16 | Wt 176.0 lb

## 2021-09-02 DIAGNOSIS — Z8546 Personal history of malignant neoplasm of prostate: Secondary | ICD-10-CM | POA: Diagnosis not present

## 2021-09-02 DIAGNOSIS — D693 Immune thrombocytopenic purpura: Secondary | ICD-10-CM

## 2021-09-02 DIAGNOSIS — D591 Autoimmune hemolytic anemia, unspecified: Secondary | ICD-10-CM | POA: Insufficient documentation

## 2021-09-02 LAB — CBC WITH DIFFERENTIAL/PLATELET
Abs Immature Granulocytes: 0.01 10*3/uL (ref 0.00–0.07)
Basophils Absolute: 0 10*3/uL (ref 0.0–0.1)
Basophils Relative: 0 %
Eosinophils Absolute: 0.1 10*3/uL (ref 0.0–0.5)
Eosinophils Relative: 2 %
HCT: 38.4 % — ABNORMAL LOW (ref 39.0–52.0)
Hemoglobin: 12.9 g/dL — ABNORMAL LOW (ref 13.0–17.0)
Immature Granulocytes: 0 %
Lymphocytes Relative: 18 %
Lymphs Abs: 1.4 10*3/uL (ref 0.7–4.0)
MCH: 33.5 pg (ref 26.0–34.0)
MCHC: 33.6 g/dL (ref 30.0–36.0)
MCV: 99.7 fL (ref 80.0–100.0)
Monocytes Absolute: 0.7 10*3/uL (ref 0.1–1.0)
Monocytes Relative: 9 %
Neutro Abs: 5.3 10*3/uL (ref 1.7–7.7)
Neutrophils Relative %: 71 %
Platelets: 324 10*3/uL (ref 150–400)
RBC: 3.85 MIL/uL — ABNORMAL LOW (ref 4.22–5.81)
RDW: 12.9 % (ref 11.5–15.5)
WBC: 7.6 10*3/uL (ref 4.0–10.5)
nRBC: 0 % (ref 0.0–0.2)

## 2021-09-02 LAB — COMPREHENSIVE METABOLIC PANEL
ALT: 16 U/L (ref 0–44)
AST: 21 U/L (ref 15–41)
Albumin: 3.9 g/dL (ref 3.5–5.0)
Alkaline Phosphatase: 60 U/L (ref 38–126)
Anion gap: 6 (ref 5–15)
BUN: 15 mg/dL (ref 8–23)
CO2: 26 mmol/L (ref 22–32)
Calcium: 8.6 mg/dL — ABNORMAL LOW (ref 8.9–10.3)
Chloride: 99 mmol/L (ref 98–111)
Creatinine, Ser: 0.89 mg/dL (ref 0.61–1.24)
GFR, Estimated: >60 mL/min (ref >60)
Glucose, Bld: 105 mg/dL — ABNORMAL HIGH (ref 70–99)
Potassium: 3.6 mmol/L (ref 3.5–5.1)
Sodium: 131 mmol/L — ABNORMAL LOW (ref 135–145)
Total Bilirubin: 0.6 mg/dL (ref 0.3–1.2)
Total Protein: 6.5 g/dL (ref 6.5–8.1)

## 2021-09-02 NOTE — Assessment & Plan Note (Signed)
#  Chronic ITP currently on  Promacta 25 mg every other day.  Platelets 324 STABLE.   # Hemolytic anemia hemoglobin 12.9LDH normal.  Continue CellCept 1000 milligrams in the morning 500 mg at night- STABLE  # DISPOSITION: # labs q 4 weeks- cbc;  # follow up in 4 months/labs-cbc/cmp/LDH; haptoglobin- Dr.B

## 2021-09-02 NOTE — Progress Notes (Signed)
[facility] OFFICE PROGRESS NOTE  Patient Care Team: Derinda Late, MD as PCP - General (Family Medicine)   SUMMARY OF ONCOLOGIC HISTORY:  #FEB 2016-  ITP  s/p Prednisone; OCT 2016- Relapse of ITP- Prednisone; JAN 13th- START RITUXAN q W x4 [finished Feb 8th]; March 10th 2017- N-plate q W- good response; Dec 2018- promacta 25 mg qOD.   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   # Hx of nose bleeds  INTERVAL HISTORY: Walking independently.  Accompanied by his wife.  [name] with a history of autoimmune hemolytic anemia/ITP is here for follow-up.    No further falls.  Appetite is good.  No weight loss.  No bleeding. Chronic mild joint pains.  No nausea or vomiting.  Review of Systems  Constitutional:  Negative for chills, diaphoresis, fever, malaise/fatigue and weight loss.  HENT:  Negative for nosebleeds and sore throat.   Eyes:  Negative for double vision.  Respiratory:  Negative for cough, hemoptysis, sputum production, shortness of breath and wheezing.   Cardiovascular:  Negative for chest pain, palpitations, orthopnea and leg swelling.  Gastrointestinal:  Negative for abdominal pain, blood in stool, constipation, diarrhea, heartburn, melena, nausea and vomiting.  Genitourinary:  Negative for dysuria, frequency and urgency.  Musculoskeletal:  Positive for back pain and joint pain.  Skin: Negative.  Negative for itching and rash.  Neurological:  Negative for dizziness, tingling, focal weakness, weakness and headaches.  Endo/Heme/Allergies:  Does not bruise/bleed easily.  Psychiatric/Behavioral:  Negative for depression. The patient is not nervous/anxious and does not have insomnia.    PAST MEDICAL HISTORY :  Past Medical History:  Diagnosis Date  . Autoimmune hemolytic anemia (HCC)   . Dehydration   . Detached retina   . Hypercholesteremia   . Hypertension   .  Insomnia   . 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 Leukocytosis   . Prostate cancer ([place])   . Shingles     PAST SURGICAL HISTORY :   Past Surgical History:  Procedure Laterality Date  . artificial left eye    . EYE SURGERY    . HERNIA REPAIR    . JOINT REPLACEMENT    . PROSTATE SURGERY      FAMILY HISTORY :   Family History  Problem Relation Age of Onset  . Cancer Mother   . Multiple sclerosis Father     SOCIAL HISTORY:   Social History   Tobacco Use  . Smoking status: Never  . Smokeless tobacco: Never  Vaping Use  . Vaping Use: Never used  Substance Use Topics  . Alcohol use: Yes  . Drug use: No    ALLERGIES:  is allergic to cephalexin and hydrocodone-acetaminophen.  MEDICATIONS:  Current Outpatient Medications  Medication Sig Dispense Refill  . acetaminophen (TYLENOL) 500 MG tablet Take 500 mg by mouth every 6 (six) hours as needed for mild pain. Reported on 01/24/2016    . bimatoprost (LUMIGAN) 0.03 % ophthalmic solution Place 1 drop into the right eye at bedtime.    . citalopram (CELEXA) 10 MG tablet Take 10 mg by mouth daily.    . Cyanocobalamin 1000 MCG TBCR Take 1,000 mcg by mouth daily.    Marland Kitchen eltrombopag (PROMACTA) 25 MG tablet TAKE 1 TABLET (25 MG TOTAL) BY MOUTH EVERY OTHER DAY. TAKE ON AN EMPTY STOMACH, 1 HOUR BEFORE A MEAL OR 2 HOURS AFTER. 30 tablet 4  .  feeding supplement, ENSURE ENLIVE, (ENSURE ENLIVE) LIQD Take 237 mLs by mouth 2 (two) times daily between meals. 237 mL 12  . lisinopril (PRINIVIL,ZESTRIL) 5 MG tablet Take 10 mg by mouth daily.     . meclizine (ANTIVERT) 25 MG tablet Take 1 tablet by mouth as needed for dizziness.    . mycophenolate (CELLCEPT) 500 MG tablet TAKE 2 TABLETS EVERY MORNING AND TAKE 1 TABLET EVERY EVENING 90 tablet 3  . omeprazole (PRILOSEC) 40 MG capsule TAKE 1 CAPSULE BY MOUTH DAILY USUALLY 30 MINUTES BEFORE BREAKFAST 90 capsule 2  . polyethylene glycol (MIRALAX / GLYCOLAX) packet Take 17 g by mouth daily  as needed for mild constipation. 14 each 0  . polyvinyl alcohol (LIQUIFILM TEARS) 1.4 % ophthalmic solution Place 2 drops into the left eye 3 (three) times daily as needed for dry eyes. 15 mL 0  . pravastatin (PRAVACHOL) 20 MG tablet Take 20 mg by mouth at bedtime.      No current facility-administered medications for this visit.    PHYSICAL EXAMINATION:   BP (!) 150/79   Pulse 69   Temp 98.5 F (36.9 C) (Oral)   Resp 16   Wt 176 lb (79.8 kg)   SpO2 98%   BMI 26.76 kg/m   Filed Weights   09/02/21 1449  Weight: 176 lb (79.8 kg)    Physical Exam HENT:     Head: Normocephalic and atraumatic.     Mouth/Throat:     Pharynx: No oropharyngeal exudate.  Eyes:     Pupils: Pupils are equal, round, and reactive to light.  Cardiovascular:     Rate and Rhythm: Normal rate and regular rhythm.  Pulmonary:     Effort: No respiratory distress.     Breath sounds: No wheezing.  Abdominal:     General: Bowel sounds are normal. There is no distension.     Palpations: Abdomen is soft. There is no mass.     Tenderness: There is no abdominal tenderness. There is no guarding or rebound.  Musculoskeletal:        General: No tenderness. Normal range of motion.     Cervical back: Normal range of motion and neck supple.  Skin:    General: Skin is warm.  Neurological:     Mental Status: He is alert and oriented to person, place, and time.  Psychiatric:        Mood and Affect: Affect normal.     LABORATORY DATA:  I have reviewed the data as listed    Component Value Date/Time   NA 131 (L) 09/02/2021 1427   NA 131 (L) 03/17/2015 1425   K 3.6 09/02/2021 1427   K 3.9 03/17/2015 1425   CL 99 09/02/2021 1427   CL 101 03/17/2015 1425   CO2 26 09/02/2021 1427   CO2 26 03/17/2015 1425   GLUCOSE 105 (H) 09/02/2021 1427   GLUCOSE 161 (H) 03/17/2015 1425   BUN 15 09/02/2021 1427   BUN 17 03/17/2015 1425   CREATININE 0.89 09/02/2021 1427   CREATININE 0.90 03/17/2015 1425   CALCIUM 8.6 (L)  09/02/2021 1427   CALCIUM 8.5 (L) 03/17/2015 1425   PROT 6.5 09/02/2021 1427   PROT 6.7 03/17/2015 1425   ALBUMIN 3.9 09/02/2021 1427   ALBUMIN 3.7 03/17/2015 1425   AST 21 09/02/2021 1427   AST 25 03/17/2015 1425   ALT 16 09/02/2021 1427   ALT 23 03/17/2015 1425   ALKPHOS 60 09/02/2021 1427   ALKPHOS 44 03/17/2015  1425   BILITOT 0.6 09/02/2021 1427   BILITOT 0.6 03/17/2015 1425   GFRNONAA >60 09/02/2021 1427   GFRNONAA >60 03/17/2015 1425   GFRAA >60 08/02/2020 1322   GFRAA >60 03/17/2015 1425    No results found for: SPEP, UPEP  Lab Results  Component Value Date   WBC 7.6 09/02/2021   NEUTROABS 5.3 09/02/2021   HGB 12.9 (L) 09/02/2021   HCT 38.4 (L) 09/02/2021   MCV 99.7 09/02/2021   PLT 324 09/02/2021      Chemistry      Component Value Date/Time   NA 131 (L) 09/02/2021 1427   NA 131 (L) 03/17/2015 1425   K 3.6 09/02/2021 1427   K 3.9 03/17/2015 1425   CL 99 09/02/2021 1427   CL 101 03/17/2015 1425   CO2 26 09/02/2021 1427   CO2 26 03/17/2015 1425   BUN 15 09/02/2021 1427   BUN 17 03/17/2015 1425   CREATININE 0.89 09/02/2021 1427   CREATININE 0.90 03/17/2015 1425      Component Value Date/Time   CALCIUM 8.6 (L) 09/02/2021 1427   CALCIUM 8.5 (L) 03/17/2015 1425   ALKPHOS 60 09/02/2021 1427   ALKPHOS 44 03/17/2015 1425   AST 21 09/02/2021 1427   AST 25 03/17/2015 1425   ALT 16 09/02/2021 1427   ALT 23 03/17/2015 1425   BILITOT 0.6 09/02/2021 1427   BILITOT 0.6 03/17/2015 1425       ASSESSMENT & PLAN:   No problem-specific Assessment & Plan notes found for this encounter.     [name], MD 09/02/2021 3:58 PM

## 2021-09-02 NOTE — Progress Notes (Signed)
Patient here for follow up patient has no complaints today VSS and WNL.

## 2021-09-05 ENCOUNTER — Encounter: Payer: Self-pay | Admitting: Internal Medicine

## 2021-09-05 DIAGNOSIS — Z23 Encounter for immunization: Secondary | ICD-10-CM | POA: Diagnosis not present

## 2021-09-08 ENCOUNTER — Other Ambulatory Visit: Payer: Self-pay

## 2021-09-08 ENCOUNTER — Emergency Department: Payer: PPO

## 2021-09-08 ENCOUNTER — Emergency Department
Admission: EM | Admit: 2021-09-08 | Discharge: 2021-09-09 | Disposition: A | Discharge status: Home / Self Care | Payer: PPO | Attending: Emergency Medicine | Admitting: Emergency Medicine

## 2021-09-08 DIAGNOSIS — Z79899 Other long term (current) drug therapy: Secondary | ICD-10-CM | POA: Insufficient documentation

## 2021-09-08 DIAGNOSIS — R55 Syncope and collapse: Secondary | ICD-10-CM | POA: Diagnosis not present

## 2021-09-08 DIAGNOSIS — M25531 Pain in right wrist: Secondary | ICD-10-CM | POA: Diagnosis not present

## 2021-09-08 DIAGNOSIS — Z9889 Other specified postprocedural states: Secondary | ICD-10-CM | POA: Diagnosis not present

## 2021-09-08 DIAGNOSIS — S52124A Nondisplaced fracture of head of right radius, initial encounter for closed fracture: Secondary | ICD-10-CM | POA: Insufficient documentation

## 2021-09-08 DIAGNOSIS — M4322 Fusion of spine, cervical region: Secondary | ICD-10-CM | POA: Diagnosis not present

## 2021-09-08 DIAGNOSIS — W19XXXA Unspecified fall, initial encounter: Secondary | ICD-10-CM | POA: Insufficient documentation

## 2021-09-08 DIAGNOSIS — S0990XA Unspecified injury of head, initial encounter: Secondary | ICD-10-CM | POA: Insufficient documentation

## 2021-09-08 DIAGNOSIS — R42 Dizziness and giddiness: Secondary | ICD-10-CM | POA: Insufficient documentation

## 2021-09-08 DIAGNOSIS — S59911A Unspecified injury of right forearm, initial encounter: Secondary | ICD-10-CM | POA: Diagnosis present

## 2021-09-08 DIAGNOSIS — S0181XA Laceration without foreign body of other part of head, initial encounter: Secondary | ICD-10-CM | POA: Diagnosis not present

## 2021-09-08 DIAGNOSIS — M25521 Pain in right elbow: Secondary | ICD-10-CM | POA: Insufficient documentation

## 2021-09-08 DIAGNOSIS — S199XXA Unspecified injury of neck, initial encounter: Secondary | ICD-10-CM | POA: Diagnosis not present

## 2021-09-08 DIAGNOSIS — Z23 Encounter for immunization: Secondary | ICD-10-CM | POA: Insufficient documentation

## 2021-09-08 DIAGNOSIS — S52514A Nondisplaced fracture of right radial styloid process, initial encounter for closed fracture: Secondary | ICD-10-CM | POA: Insufficient documentation

## 2021-09-08 DIAGNOSIS — M47812 Spondylosis without myelopathy or radiculopathy, cervical region: Secondary | ICD-10-CM | POA: Diagnosis not present

## 2021-09-08 DIAGNOSIS — M7989 Other specified soft tissue disorders: Secondary | ICD-10-CM | POA: Diagnosis not present

## 2021-09-08 DIAGNOSIS — S01111A Laceration without foreign body of right eyelid and periocular area, initial encounter: Secondary | ICD-10-CM | POA: Diagnosis not present

## 2021-09-08 DIAGNOSIS — M4312 Spondylolisthesis, cervical region: Secondary | ICD-10-CM | POA: Diagnosis not present

## 2021-09-08 DIAGNOSIS — Z8546 Personal history of malignant neoplasm of prostate: Secondary | ICD-10-CM | POA: Diagnosis not present

## 2021-09-08 NOTE — ED Provider Notes (Signed)
Emergency Medicine Provider Triage Evaluation Note  [name]. , a 87 y.o. male  was evaluated in triage.  Pt complains of head injury, laceration, right elbow pain/abrasion as well as right wrist pain.  Patient went for a walk earlier today, fell and hit his head.  Complaining of headache.  No LOC, nausea or vomiting.  Denies any other pain to his body except for the elbow and the wrist on the right side.  Review of Systems  Positive: Wound, joint pain, headache Negative: Nausea, vomiting, LOC, chest pain, shortness of breath, lower extremity discomfort  Physical Exam  Ht 5\' 7"  (1.702 m)   Wt 77.6 kg   BMI 26.78 kg/m  Gen:   Awake, no distress   Resp:  Normal effort  MSK:   Moves extremities without difficulty  Other:  Dressing to the forehead, laceration above the right eyebrow, right elbow skin tear  Medical Decision Making  Medically screening exam initiated at 6:43 PM.  Appropriate orders placed.  Alesia Morin. was informed that the remainder of the evaluation will be completed by another provider, this initial triage assessment does not replace that evaluation, and the importance of remaining in the ED until their evaluation is complete.  Labs and imaging pending.  Tetanus is up-to-date.   Duanne Guess, PA-C 09/08/21 1845    Naaman Plummer, MD 09/08/21 [phone]

## 2021-09-08 NOTE — ED Triage Notes (Signed)
Pt reports that he was taking a walk and came back home and fell up the stairs when coming into the house on to [place]. He is fell face first. He has a platelet disorder. He has a laceration above his right eye and right elbow bleeding is controlled . His right wrist is sore.

## 2021-09-08 NOTE — ED Triage Notes (Signed)
No LOC or blood thinners

## 2021-09-09 LAB — CBC WITH DIFFERENTIAL/PLATELET
Abs Immature Granulocytes: 0.04 10*3/uL (ref 0.00–0.07)
Basophils Absolute: 0 10*3/uL (ref 0.0–0.1)
Basophils Relative: 0 %
Eosinophils Absolute: 0.2 10*3/uL (ref 0.0–0.5)
Eosinophils Relative: 2 %
HCT: 39.1 % (ref 39.0–52.0)
Hemoglobin: 13.9 g/dL (ref 13.0–17.0)
Immature Granulocytes: 0 %
Lymphocytes Relative: 14 %
Lymphs Abs: 1.4 10*3/uL (ref 0.7–4.0)
MCH: 35.2 pg — ABNORMAL HIGH (ref 26.0–34.0)
MCHC: 35.5 g/dL (ref 30.0–36.0)
MCV: 99 fL (ref 80.0–100.0)
Monocytes Absolute: 0.9 10*3/uL (ref 0.1–1.0)
Monocytes Relative: 9 %
Neutro Abs: 7.4 10*3/uL (ref 1.7–7.7)
Neutrophils Relative %: 75 %
Platelets: 346 10*3/uL (ref 150–400)
RBC: 3.95 MIL/uL — ABNORMAL LOW (ref 4.22–5.81)
RDW: 13 % (ref 11.5–15.5)
WBC: 9.9 10*3/uL (ref 4.0–10.5)
nRBC: 0 % (ref 0.0–0.2)

## 2021-09-09 LAB — BASIC METABOLIC PANEL
Anion gap: 6 (ref 5–15)
BUN: 14 mg/dL (ref 8–23)
CO2: 27 mmol/L (ref 22–32)
Calcium: 8.8 mg/dL — ABNORMAL LOW (ref 8.9–10.3)
Chloride: 101 mmol/L (ref 98–111)
Creatinine, Ser: 0.76 mg/dL (ref 0.61–1.24)
GFR, Estimated: >60 mL/min (ref >60)
Glucose, Bld: 116 mg/dL — ABNORMAL HIGH (ref 70–99)
Potassium: 3.6 mmol/L (ref 3.5–5.1)
Sodium: 134 mmol/L — ABNORMAL LOW (ref 135–145)

## 2021-09-09 MED ORDER — ACETAMINOPHEN 500 MG PO TABS
1000.0000 mg | ORAL_TABLET | Freq: Once | ORAL | Status: AC
  Administered 2021-09-09: 1000 mg via ORAL
  Filled 2021-09-09: qty 2

## 2021-09-09 MED ORDER — LIDOCAINE-EPINEPHRINE 2 %-1:100000 IJ SOLN
20.0000 mL | Freq: Once | INTRAMUSCULAR | Status: AC
  Administered 2021-09-09: 20 mL
  Filled 2021-09-09: qty 1

## 2021-09-09 MED ORDER — TETANUS-DIPHTHERIA TOXOIDS TD 5-2 LFU IM INJ
0.5000 mL | INJECTION | Freq: Once | INTRAMUSCULAR | Status: AC
  Administered 2021-09-09: 0.5 mL via INTRAMUSCULAR
  Filled 2021-09-09: qty 0.5

## 2021-09-09 NOTE — Discharge Instructions (Signed)
Your CAT scan of your face, head and neck did not show any broken bones.  You should not was repaired with sutures and he should be removed in about 5 to 7 days.  You do have a fracture of your distal radius, please keep the splint on until you can follow-up with orthopedics.  You may also have an occult fracture of your elbow.  Please wear the sling for this.  Please follow-up with orthopedics in about 1 week.  You can take Tylenol for pain.

## 2021-09-09 NOTE — ED Provider Notes (Signed)
[facility]  ____________________________________________   Event Date/Time   First MD Initiated Contact with Patient 09/09/21 0217     (approximate)  I have reviewed the triage vital signs and the nursing notes.   HISTORY  Chief Complaint Fall and Head Injury   Past medical history of HPI [name]. is a 87 y.o. male past medical history of ITP, prostate cancer, hypertension, hyperlipidemia who presents after a fall.  Patient played golf today and then went up on a 45-minute walk around the neighborhood.  He felt well during this time.  When he got home he walked up onto his porch and felt slightly lightheaded and then had a fall.  He denies loss of consciousness.  He denies any preceding chest pain, palpitations or dyspnea.  He hit the right side of his head and landed on the right wrist and elbow.  He denies visual change, nausea vomiting numbness or weakness.  Denies chest or abdominal pain.  He has pain in the right wrist elbow and over the right face.  He is not on blood thinners.  Patient and his wife tell me that he has had syncopal episodes in the past and has been seen by cardiology and wore monitors but was never diagnosed with anything concerning.         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   Nasal bleeding 01/08/2016   Uncontrolled hypertension 01/08/2016   Idiopathic thrombocytopenic purpura ([place]) 09/01/2015   Amaurosis fugax of right eye 08/10/2015   Acute renal failure ([place]) 04/06/2015    History of ITP 03/22/2015   Syncope 12/21/2014   Pure hypercholesterolemia 08/27/2014   Biceps tendon tear 04/09/2014   Cancer of prostate w/low recurrence risk (T1-2a, Gleason<7 & PSA<10) ([place]) 07/30/2012   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 Authorizing Provider  acetaminophen (TYLENOL) 500 MG tablet Take 500 mg by mouth every 6 (six) hours as needed for mild pain. Reported on 01/24/2016    [provider]  bimatoprost (LUMIGAN) 0.03 % ophthalmic solution Place 1 drop into the right eye at bedtime. 03/30/21   [provider]  citalopram (CELEXA) 10 MG tablet Take 10 mg by mouth daily.    [provider]  Cyanocobalamin 1000 MCG TBCR Take 1,000 mcg by mouth daily.    [provider]  eltrombopag (PROMACTA) 25 MG tablet TAKE 1 TABLET (25 MG TOTAL) BY MOUTH EVERY OTHER DAY. TAKE ON AN EMPTY STOMACH, 1 HOUR BEFORE A MEAL OR 2 HOURS AFTER. 07/16/20   [name], MD  feeding supplement, ENSURE ENLIVE, (ENSURE ENLIVE) LIQD Take 237 mLs by mouth 2 (  two) times daily between meals. 04/08/15   [name], MD  lisinopril (PRINIVIL,ZESTRIL) 5 MG tablet Take 10 mg by mouth daily.  01/12/17   [provider]  meclizine (ANTIVERT) 25 MG tablet Take 1 tablet by mouth as needed for dizziness. 12/05/18   [provider]  mycophenolate (CELLCEPT) 500 MG tablet TAKE 2 TABLETS EVERY MORNING AND TAKE 1 TABLET EVERY EVENING 06/25/21   [name], MD  omeprazole (PRILOSEC) 40 MG capsule TAKE 1 CAPSULE BY MOUTH DAILY USUALLY 30 MINUTES BEFORE BREAKFAST 09/28/17   [name], MD  polyethylene glycol (MIRALAX / GLYCOLAX) packet Take 17 g by mouth daily as  needed for mild constipation. 12/29/15   [name], MD  polyvinyl alcohol (LIQUIFILM TEARS) 1.4 % ophthalmic solution Place 2 drops into the left eye 3 (three) times daily as needed for dry eyes. 01/10/16   [name], MD  pravastatin (PRAVACHOL) 20 MG tablet Take 20 mg by mouth at bedtime.     [provider]    Allergies Cephalexin and Hydrocodone-acetaminophen  Family History  Problem Relation Age of Onset   Cancer Mother    Multiple sclerosis Father     Social History Social History   Tobacco Use   Smoking status: Never   Smokeless tobacco: Never  Vaping Use   Vaping Use: Never used  Substance Use Topics   Alcohol use: Yes   Drug use: No    Review of Systems   Review of Systems  HENT:  Positive for facial swelling.   Eyes:  Positive for pain. Negative for visual disturbance.  Cardiovascular:  Negative for chest pain.  Gastrointestinal:  Negative for abdominal pain.  Neurological:  Negative for weakness, numbness and headaches.  All other systems reviewed and are negative.  Physical Exam Updated Vital Signs BP (!) 180/97 (BP Location: Left Arm)   Pulse 63   Temp 97.9 F (36.6 C) (Oral)   Resp 20   Ht 5\' 7"  (1.702 m)   Wt 77.6 kg   SpO2 97%   BMI 26.78 kg/m   Physical Exam Vitals and nursing note reviewed.  Constitutional:      General: He is not in acute distress.    Appearance: Normal appearance.  HENT:     Head: Normocephalic.     Comments: Significant periorbital ecchymosis 2 cm laceration in the right eyebrow      Mouth/Throat:     Mouth: Mucous membranes are dry.     Comments: Swelling of the right upper lip, no laceration Eyes:     General: No scleral icterus.    Extraocular Movements: Extraocular movements intact.     Conjunctiva/sclera: Conjunctivae normal.     Pupils: Pupils are equal, round, and reactive to light.  Pulmonary:     Effort: Pulmonary effort is normal. No respiratory distress.     Breath sounds:  Normal breath sounds. No wheezing.  Musculoskeletal:        General: Signs of injury present. No deformity.     Cervical back: Normal range of motion.     Comments: No chest wall tenderness or crepitus, pelvis is stable nontender, no C, T or L-spine tenderness  Skin tear over the right elbow, no significant bony tenderness or swelling, normal range of motion Ecchymosis over the right thenar eminence and mild swelling, tenderness to palpation over the right distal radius Intact thumbs up, okay sign, and finger abduction 2+ radial pulse  Skin:    Coloration: Skin is  not jaundiced or pale.  Neurological:     General: No focal deficit present.     Mental Status: He is alert and oriented to person, place, and time. Mental status is at baseline.  Psychiatric:        Mood and Affect: Mood normal.        Behavior: Behavior normal.     LABS (all labs ordered are listed, but only abnormal results are displayed)  Labs Reviewed  CBC WITH DIFFERENTIAL/PLATELET - Abnormal; Notable for the following components:      Result Value   RBC 3.95 (*)    MCH 35.2 (*)    All other components within normal limits  BASIC METABOLIC PANEL - Abnormal; Notable for the following components:   Sodium 134 (*)    Glucose, Bld 116 (*)    Calcium 8.8 (*)    All other components within normal limits   ____________________________________________  EKG  NSR, nml axis, nml intervals, no acute ischemic changes  ____________________________________________  RADIOLOGY Almeta Monas, personally viewed and evaluated these images (plain radiographs) as part of my medical decision making, as well as reviewing the written report by the radiologist.  ED MD interpretation:  I reviewed the CT scan of the brain which does not show any acute intracranial process    I reviewed the CT of the cervical spine which does not show any acute fracture or misalignment    To the x-ray the right wrist which shows a minimally  displaced right radial styloid fracture  Reviewed the x-ray of the elbow which is read as negative by radiology however I do appreciate a posterior fat-pad and anterior sail sign    ____________________________________________   PROCEDURES  Procedure(s) performed (including Critical Care):  Marland KitchenMarland KitchenLaceration Repair  Date/Time: 09/09/2021 5:17 AM Performed by: Rada Hay, MD Authorized by: Rada Hay, MD   Consent:    Consent obtained:  Verbal   Alternatives discussed:  No treatment Universal protocol:    Patient identity confirmed:  Verbally with patient Laceration details:    Location:  Face   Face location:  R eyebrow   Length (cm):  2 Exploration:    Limited defect created (wound extended): no     Hemostasis achieved with:  Direct pressure   Wound extent: no foreign bodies/material noted, no muscle damage noted, no underlying fracture noted and no vascular damage noted     Contaminated: no   Treatment:    Area cleansed with:  Saline   Amount of cleaning:  Standard   Irrigation solution:  Sterile saline   Irrigation volume:  100   Irrigation method:  Syringe   Visualized foreign bodies/material removed: no     Debridement:  None   Undermining:  None   Scar revision: no   Skin repair:    Repair method:  Sutures   Suture size:  5-0   Suture material:  Nylon   Suture technique:  Simple interrupted   Number of sutures:  4 Approximation:    Approximation:  Close Repair type:    Repair type:  Simple Post-procedure details:    Dressing:  Open (no dressing)   Procedure completion:  Tolerated well, no immediate complications   ____________________________________________   INITIAL IMPRESSION / ASSESSMENT AND PLAN / ED COURSE     Is an 87 year old male who presents after a fall.  Patient seems to have had a presyncopal episode with prodrome of lightheadedness.  This occurred after he had gone on  a walk and then went up just 3 stairs, and there was  no preceding chest pain shortness of breath or dyspnea.  On exam he has obvious periorbital ecchymosis but no signs of entrapment and normal pupillary response.  He does have a laceration to the right eyebrow which was repaired primarily.  Patient also has a skin tear of the right elbow but no obvious swelling or tenderness of the elbow and some ecchymosis and swelling of the right distal radius.  Imaging of the head and neck were obtained which do not show any acute abnormality max face negative as well.  X-ray of the right wrist is notable for a nondisplaced distal radial styloid fracture.  X-ray of the elbow was obtained which is read by radiology as negative however I question whether there is a posterior fat pad and potentially an anterior sail sign.  Patient was placed in a volar short arm splint for the radial fracture as well as a sling for potential occult radial head fracture.  Tetanus was updated.  He was advised to follow-up with orthopedics in about a week and to have sutures removed in 5 to 7 days.  In terms of his syncope I am reassured that he had a prodrome and has had similar episodes in the past.  His EKG today does not have any concerning features including abnormal intervals or signs of ischemia and his CBC and BMP are normal.  I feel that he is stable for discharge.      ____________________________________________   FINAL CLINICAL IMPRESSION(S) / ED DIAGNOSES  Final diagnoses:  Syncope, unspecified syncope type  Facial laceration, initial encounter  Closed nondisplaced fracture of styloid process of right radius, initial encounter  Closed nondisplaced fracture of head of right radius, initial encounter     ED Discharge Orders     None        Note:  This document was prepared using Dragon voice recognition software and may include unintentional dictation errors.    Rada Hay, MD 09/09/21 [phone]

## 2021-09-12 DIAGNOSIS — S299XXA Unspecified injury of thorax, initial encounter: Secondary | ICD-10-CM | POA: Diagnosis not present

## 2021-09-12 DIAGNOSIS — S2241XA Multiple fractures of ribs, right side, initial encounter for closed fracture: Secondary | ICD-10-CM | POA: Diagnosis not present

## 2021-09-12 DIAGNOSIS — W010XXA Fall on same level from slipping, tripping and stumbling without subsequent striking against object, initial encounter: Secondary | ICD-10-CM | POA: Diagnosis not present

## 2021-09-15 DIAGNOSIS — S299XXA Unspecified injury of thorax, initial encounter: Secondary | ICD-10-CM | POA: Diagnosis not present

## 2021-09-15 DIAGNOSIS — S01111D Laceration without foreign body of right eyelid and periocular area, subsequent encounter: Secondary | ICD-10-CM | POA: Diagnosis not present

## 2021-09-15 DIAGNOSIS — M25521 Pain in right elbow: Secondary | ICD-10-CM | POA: Diagnosis not present

## 2021-09-15 DIAGNOSIS — S52514A Nondisplaced fracture of right radial styloid process, initial encounter for closed fracture: Secondary | ICD-10-CM | POA: Diagnosis not present

## 2021-09-15 DIAGNOSIS — S5011XA Contusion of right forearm, initial encounter: Secondary | ICD-10-CM | POA: Diagnosis not present

## 2021-09-15 DIAGNOSIS — M25531 Pain in right wrist: Secondary | ICD-10-CM | POA: Diagnosis not present

## 2021-09-19 ENCOUNTER — Other Ambulatory Visit: Payer: Self-pay | Admitting: *Deleted

## 2021-09-19 DIAGNOSIS — D693 Immune thrombocytopenic purpura: Secondary | ICD-10-CM

## 2021-09-19 MED ORDER — ELTROMBOPAG OLAMINE 25 MG PO TABS: ORAL_TABLET | ORAL | 4 refills | Status: DC

## 2021-09-29 ENCOUNTER — Telehealth: Payer: Self-pay | Admitting: Internal Medicine

## 2021-09-29 DIAGNOSIS — M25521 Pain in right elbow: Secondary | ICD-10-CM | POA: Diagnosis not present

## 2021-09-29 DIAGNOSIS — S52514D Nondisplaced fracture of right radial styloid process, subsequent encounter for closed fracture with routine healing: Secondary | ICD-10-CM | POA: Diagnosis not present

## 2021-09-29 DIAGNOSIS — M25531 Pain in right wrist: Secondary | ICD-10-CM | POA: Diagnosis not present

## 2021-09-29 DIAGNOSIS — S5011XD Contusion of right forearm, subsequent encounter: Secondary | ICD-10-CM | POA: Diagnosis not present

## 2021-09-29 NOTE — Telephone Encounter (Signed)
I called wife back. Pt still not recovered from recent fall and fracture of the arm. Wife is concerned about the rain tom and pt's fall risk. She wanted to r/s for Monday. Apt r/s for 11/14 at 315pm

## 2021-09-29 NOTE — Telephone Encounter (Signed)
Wife called to cancel appt for 11-11. Please to reschedule at [phone]

## 2021-09-30 ENCOUNTER — Inpatient Hospital Stay: Payer: PPO

## 2021-10-03 ENCOUNTER — Inpatient Hospital Stay: Payer: PPO | Attending: Internal Medicine

## 2021-10-03 ENCOUNTER — Other Ambulatory Visit: Payer: Self-pay

## 2021-10-03 DIAGNOSIS — D591 Autoimmune hemolytic anemia, unspecified: Secondary | ICD-10-CM | POA: Insufficient documentation

## 2021-10-03 DIAGNOSIS — D693 Immune thrombocytopenic purpura: Secondary | ICD-10-CM

## 2021-10-03 LAB — COMPREHENSIVE METABOLIC PANEL
ALT: 16 U/L (ref 0–44)
AST: 19 U/L (ref 15–41)
Albumin: 3.7 g/dL (ref 3.5–5.0)
Alkaline Phosphatase: 86 U/L (ref 38–126)
Anion gap: 6 (ref 5–15)
BUN: 15 mg/dL (ref 8–23)
CO2: 30 mmol/L (ref 22–32)
Calcium: 8.8 mg/dL — ABNORMAL LOW (ref 8.9–10.3)
Chloride: 98 mmol/L (ref 98–111)
Creatinine, Ser: 0.81 mg/dL (ref 0.61–1.24)
GFR, Estimated: >60 mL/min (ref >60)
Glucose, Bld: 98 mg/dL (ref 70–99)
Potassium: 4.2 mmol/L (ref 3.5–5.1)
Sodium: 134 mmol/L — ABNORMAL LOW (ref 135–145)
Total Bilirubin: 0.5 mg/dL (ref 0.3–1.2)
Total Protein: 7.4 g/dL (ref 6.5–8.1)

## 2021-10-03 LAB — CBC WITH DIFFERENTIAL/PLATELET
Abs Immature Granulocytes: 0.02 10*3/uL (ref 0.00–0.07)
Basophils Absolute: 0 10*3/uL (ref 0.0–0.1)
Basophils Relative: 0 %
Eosinophils Absolute: 0.1 10*3/uL (ref 0.0–0.5)
Eosinophils Relative: 2 %
HCT: 38.9 % — ABNORMAL LOW (ref 39.0–52.0)
Hemoglobin: 13.2 g/dL (ref 13.0–17.0)
Immature Granulocytes: 0 %
Lymphocytes Relative: 16 %
Lymphs Abs: 1.3 10*3/uL (ref 0.7–4.0)
MCH: 34.1 pg — ABNORMAL HIGH (ref 26.0–34.0)
MCHC: 33.9 g/dL (ref 30.0–36.0)
MCV: 100.5 fL — ABNORMAL HIGH (ref 80.0–100.0)
Monocytes Absolute: 0.7 10*3/uL (ref 0.1–1.0)
Monocytes Relative: 9 %
Neutro Abs: 5.8 10*3/uL (ref 1.7–7.7)
Neutrophils Relative %: 73 %
Platelets: 369 10*3/uL (ref 150–400)
RBC: 3.87 MIL/uL — ABNORMAL LOW (ref 4.22–5.81)
RDW: 13 % (ref 11.5–15.5)
WBC: 7.9 10*3/uL (ref 4.0–10.5)
nRBC: 0 % (ref 0.0–0.2)

## 2021-10-06 ENCOUNTER — Other Ambulatory Visit: Payer: Self-pay | Admitting: Pharmacist

## 2021-10-06 DIAGNOSIS — D693 Immune thrombocytopenic purpura: Secondary | ICD-10-CM

## 2021-10-06 MED ORDER — ELTROMBOPAG OLAMINE 25 MG PO TABS: ORAL_TABLET | ORAL | 4 refills | Status: DC

## 2021-10-10 ENCOUNTER — Telehealth: Payer: Self-pay | Admitting: Pharmacy Technician

## 2021-10-19 ENCOUNTER — Other Ambulatory Visit: Payer: Self-pay | Admitting: Internal Medicine

## 2021-10-19 DIAGNOSIS — D693 Immune thrombocytopenic purpura: Secondary | ICD-10-CM

## 2021-10-24 DIAGNOSIS — C61 Malignant neoplasm of prostate: Secondary | ICD-10-CM | POA: Diagnosis not present

## 2021-10-24 DIAGNOSIS — Z79899 Other long term (current) drug therapy: Secondary | ICD-10-CM | POA: Diagnosis not present

## 2021-10-24 DIAGNOSIS — E7800 Pure hypercholesterolemia, unspecified: Secondary | ICD-10-CM | POA: Diagnosis not present

## 2021-10-25 DIAGNOSIS — D485 Neoplasm of uncertain behavior of skin: Secondary | ICD-10-CM | POA: Diagnosis not present

## 2021-10-25 DIAGNOSIS — L821 Other seborrheic keratosis: Secondary | ICD-10-CM | POA: Diagnosis not present

## 2021-10-25 DIAGNOSIS — D2262 Melanocytic nevi of left upper limb, including shoulder: Secondary | ICD-10-CM | POA: Diagnosis not present

## 2021-10-25 DIAGNOSIS — D225 Melanocytic nevi of trunk: Secondary | ICD-10-CM | POA: Diagnosis not present

## 2021-10-25 DIAGNOSIS — D2261 Melanocytic nevi of right upper limb, including shoulder: Secondary | ICD-10-CM | POA: Diagnosis not present

## 2021-10-25 DIAGNOSIS — D2272 Melanocytic nevi of left lower limb, including hip: Secondary | ICD-10-CM | POA: Diagnosis not present

## 2021-10-25 DIAGNOSIS — D2271 Melanocytic nevi of right lower limb, including hip: Secondary | ICD-10-CM | POA: Diagnosis not present

## 2021-10-25 DIAGNOSIS — D0462 Carcinoma in situ of skin of left upper limb, including shoulder: Secondary | ICD-10-CM | POA: Diagnosis not present

## 2021-10-28 ENCOUNTER — Other Ambulatory Visit: Payer: Self-pay

## 2021-10-28 ENCOUNTER — Inpatient Hospital Stay: Payer: PPO | Attending: Internal Medicine

## 2021-10-28 DIAGNOSIS — D693 Immune thrombocytopenic purpura: Secondary | ICD-10-CM

## 2021-10-28 DIAGNOSIS — D591 Autoimmune hemolytic anemia, unspecified: Secondary | ICD-10-CM | POA: Diagnosis not present

## 2021-10-28 LAB — CBC WITH DIFFERENTIAL/PLATELET
Abs Immature Granulocytes: 0.02 10*3/uL (ref 0.00–0.07)
Basophils Absolute: 0 10*3/uL (ref 0.0–0.1)
Basophils Relative: 0 %
Eosinophils Absolute: 0.2 10*3/uL (ref 0.0–0.5)
Eosinophils Relative: 3 %
HCT: 39.3 % (ref 39.0–52.0)
Hemoglobin: 13.6 g/dL (ref 13.0–17.0)
Immature Granulocytes: 0 %
Lymphocytes Relative: 21 %
Lymphs Abs: 1.8 10*3/uL (ref 0.7–4.0)
MCH: 34.8 pg — ABNORMAL HIGH (ref 26.0–34.0)
MCHC: 34.6 g/dL (ref 30.0–36.0)
MCV: 100.5 fL — ABNORMAL HIGH (ref 80.0–100.0)
Monocytes Absolute: 0.8 10*3/uL (ref 0.1–1.0)
Monocytes Relative: 9 %
Neutro Abs: 5.7 10*3/uL (ref 1.7–7.7)
Neutrophils Relative %: 67 %
Platelets: 312 10*3/uL (ref 150–400)
RBC: 3.91 MIL/uL — ABNORMAL LOW (ref 4.22–5.81)
RDW: 13.3 % (ref 11.5–15.5)
WBC: 8.6 10*3/uL (ref 4.0–10.5)
nRBC: 0 % (ref 0.0–0.2)

## 2021-10-31 DIAGNOSIS — Z1331 Encounter for screening for depression: Secondary | ICD-10-CM | POA: Diagnosis not present

## 2021-10-31 DIAGNOSIS — Z0000 Encounter for general adult medical examination without abnormal findings: Secondary | ICD-10-CM | POA: Diagnosis not present

## 2021-11-25 ENCOUNTER — Other Ambulatory Visit: Payer: Self-pay

## 2021-11-25 ENCOUNTER — Inpatient Hospital Stay: Payer: PPO | Attending: Internal Medicine

## 2021-11-25 DIAGNOSIS — D693 Immune thrombocytopenic purpura: Secondary | ICD-10-CM

## 2021-11-25 LAB — CBC WITH DIFFERENTIAL/PLATELET
Abs Immature Granulocytes: 0.03 10*3/uL (ref 0.00–0.07)
Basophils Absolute: 0 10*3/uL (ref 0.0–0.1)
Basophils Relative: 0 %
Eosinophils Absolute: 0.1 10*3/uL (ref 0.0–0.5)
Eosinophils Relative: 2 %
HCT: 40.2 % (ref 39.0–52.0)
Hemoglobin: 13.7 g/dL (ref 13.0–17.0)
Immature Granulocytes: 0 %
Lymphocytes Relative: 19 %
Lymphs Abs: 1.5 10*3/uL (ref 0.7–4.0)
MCH: 34.1 pg — ABNORMAL HIGH (ref 26.0–34.0)
MCHC: 34.1 g/dL (ref 30.0–36.0)
MCV: 100 fL (ref 80.0–100.0)
Monocytes Absolute: 0.8 10*3/uL (ref 0.1–1.0)
Monocytes Relative: 10 %
Neutro Abs: 5.5 10*3/uL (ref 1.7–7.7)
Neutrophils Relative %: 69 %
Platelets: 332 10*3/uL (ref 150–400)
RBC: 4.02 MIL/uL — ABNORMAL LOW (ref 4.22–5.81)
RDW: 13.4 % (ref 11.5–15.5)
WBC: 8 10*3/uL (ref 4.0–10.5)
nRBC: 0 % (ref 0.0–0.2)

## 2021-12-01 NOTE — Telephone Encounter (Signed)
Oral Oncology Patient Advocate Encounter  Received communication from [employer] that the patient's eligibility in the patient assistance program for Promacta was due for re-enrollment.    The renewal application has been completed and faxed in an effort to keep the patient's out of pocket expense for Promacta at $0.     Application completed and faxed to [phone].    [employer] patient assistance phone number for follow up is [phone].    This encounter will be updated until final determination.  [name] Phone [phone] Fax [phone]

## 2021-12-01 NOTE — Telephone Encounter (Signed)
Oral Oncology Patient Advocate Encounter  Received notification from [employer]  that patient has been successfully enrolled into their program to receive Promacta from the manufacturer at $0 out of pocket until 11/19/22.    I called and spoke with patient.  He knows we will have to re-apply.   Specialty Pharmacy that will dispense medication is [facility].  Patient knows to call the office with questions or concerns.   Oral Oncology Clinic will continue to follow.  [place] Patient [place] Phone [phone] Fax [phone] 12/01/2021 4:25 PM

## 2021-12-23 ENCOUNTER — Encounter: Payer: Self-pay | Admitting: Internal Medicine

## 2021-12-23 ENCOUNTER — Other Ambulatory Visit: Payer: Self-pay

## 2021-12-23 ENCOUNTER — Inpatient Hospital Stay: Payer: PPO | Attending: Internal Medicine

## 2021-12-23 ENCOUNTER — Inpatient Hospital Stay: Payer: PPO | Admitting: Internal Medicine

## 2021-12-23 DIAGNOSIS — D693 Immune thrombocytopenic purpura: Secondary | ICD-10-CM | POA: Diagnosis not present

## 2021-12-23 DIAGNOSIS — T451X5A Adverse effect of antineoplastic and immunosuppressive drugs, initial encounter: Secondary | ICD-10-CM | POA: Diagnosis not present

## 2021-12-23 DIAGNOSIS — Z79899 Other long term (current) drug therapy: Secondary | ICD-10-CM | POA: Diagnosis not present

## 2021-12-23 DIAGNOSIS — D590 Drug-induced autoimmune hemolytic anemia: Secondary | ICD-10-CM | POA: Insufficient documentation

## 2021-12-23 LAB — CBC WITH DIFFERENTIAL/PLATELET
Abs Immature Granulocytes: 0.03 10*3/uL (ref 0.00–0.07)
Basophils Absolute: 0 10*3/uL (ref 0.0–0.1)
Basophils Relative: 0 %
Eosinophils Absolute: 0.2 10*3/uL (ref 0.0–0.5)
Eosinophils Relative: 2 %
HCT: 41.3 % (ref 39.0–52.0)
Hemoglobin: 14 g/dL (ref 13.0–17.0)
Immature Granulocytes: 0 %
Lymphocytes Relative: 20 %
Lymphs Abs: 1.7 10*3/uL (ref 0.7–4.0)
MCH: 33.6 pg (ref 26.0–34.0)
MCHC: 33.9 g/dL (ref 30.0–36.0)
MCV: 99 fL (ref 80.0–100.0)
Monocytes Absolute: 0.6 10*3/uL (ref 0.1–1.0)
Monocytes Relative: 7 %
Neutro Abs: 6.1 10*3/uL (ref 1.7–7.7)
Neutrophils Relative %: 71 %
Platelets: 391 10*3/uL (ref 150–400)
RBC: 4.17 MIL/uL — ABNORMAL LOW (ref 4.22–5.81)
RDW: 13.2 % (ref 11.5–15.5)
WBC: 8.7 10*3/uL (ref 4.0–10.5)
nRBC: 0 % (ref 0.0–0.2)

## 2021-12-23 NOTE — Assessment & Plan Note (Addendum)
#  Chronic ITP currently on  Promacta 25 mg every other day.  Platelets 391- STABLE  # Hemolytic anemia hemoglobin -14 - LDH normal.  Continue CellCept 1000 milligrams in the morning 500 mg at night- STABLE  # Rising PSA- 10- follow ups with [facility]  # DISPOSITION: # labs q 4 weeks- cbc;  # follow up in 4 months/labs-cbc/cmp/LDH; haptoglobin- Dr.B

## 2021-12-23 NOTE — Progress Notes (Signed)
Patient states no concerns at the moment. 

## 2021-12-23 NOTE — Progress Notes (Signed)
[facility] OFFICE PROGRESS NOTE  Patient Care Team: Derinda Late, MD as PCP - General (Family Medicine)   SUMMARY OF ONCOLOGIC HISTORY:  #FEB 2016-  ITP  s/p Prednisone; OCT 2016- Relapse of ITP- Prednisone; JAN 13th- START RITUXAN q W x4 [finished Feb 8th]; March 10th 2017- N-plate q W- good response; Dec 2018- promacta 25 mg qOD.   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   # Hx of nose bleeds  INTERVAL HISTORY: Walking independently.  Accompanied by his wife.  A very pleasant 88 year old male patient with a history of autoimmune hemolytic anemia/ITP is here for follow-up.    No falls.  Continues to play golf.  No worsening fatigue.   Review of Systems  Constitutional:  Negative for chills, diaphoresis, fever, malaise/fatigue and weight loss.  HENT:  Negative for nosebleeds and sore throat.   Eyes:  Negative for double vision.  Respiratory:  Negative for cough, hemoptysis, sputum production, shortness of breath and wheezing.   Cardiovascular:  Negative for chest pain, palpitations, orthopnea and leg swelling.  Gastrointestinal:  Negative for abdominal pain, blood in stool, constipation, diarrhea, heartburn, melena, nausea and vomiting.  Genitourinary:  Negative for dysuria, frequency and urgency.  Musculoskeletal:  Positive for back pain and joint pain.  Skin: Negative.  Negative for itching and rash.  Neurological:  Negative for dizziness, tingling, focal weakness, weakness and headaches.  Endo/Heme/Allergies:  Does not bruise/bleed easily.  Psychiatric/Behavioral:  Negative for depression. The patient is not nervous/anxious and does not have insomnia.    PAST MEDICAL HISTORY :  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    Leukocytosis    Prostate cancer ([place])    Shingles     PAST SURGICAL HISTORY :   Past Surgical History:  Procedure Laterality Date   artificial left eye     EYE SURGERY     HERNIA REPAIR     JOINT REPLACEMENT     PROSTATE SURGERY      FAMILY HISTORY :   Family History  Problem Relation Age of Onset   Cancer Mother    Multiple sclerosis Father     SOCIAL HISTORY:   Social History   Tobacco Use   Smoking status: Never   Smokeless tobacco: Never  Vaping Use   Vaping Use: Never used  Substance Use Topics   Alcohol use: Yes   Drug use: No    ALLERGIES:  is allergic to cephalexin and hydrocodone-acetaminophen.  MEDICATIONS:  Current Outpatient Medications  Medication Sig Dispense Refill   acetaminophen (TYLENOL) 500 MG tablet Take 500 mg by mouth every 6 (six) hours as needed for mild pain. Reported on 01/24/2016     bimatoprost (LUMIGAN) 0.03 % ophthalmic solution Place 1 drop into the right eye at bedtime.     citalopram (CELEXA) 10 MG tablet Take 10 mg by mouth daily.     Cyanocobalamin 1000 MCG TBCR Take 1,000 mcg by mouth daily.     eltrombopag (PROMACTA) 25 MG tablet TAKE 1 TABLET (25 MG TOTAL) BY MOUTH EVERY OTHER DAY. TAKE ON AN EMPTY STOMACH, 1 HOUR BEFORE A MEAL OR 2 HOURS AFTER. 30 tablet 4   feeding supplement, ENSURE ENLIVE, (ENSURE ENLIVE) LIQD Take 237 mLs by  mouth 2 (two) times daily between meals. 237 mL 12   lisinopril (PRINIVIL,ZESTRIL) 5 MG tablet Take 10 mg by mouth daily.      meclizine (ANTIVERT) 25 MG tablet Take 1 tablet by mouth as needed for dizziness.     mycophenolate (CELLCEPT) 500 MG tablet TAKE 2 TABLETS EVERY MORNING AND TAKE 1 TABLET EVERY EVENING 90 tablet 3   omeprazole (PRILOSEC) 40 MG capsule TAKE 1 CAPSULE BY MOUTH DAILY USUALLY 30 MINUTES BEFORE BREAKFAST 90 capsule 2   polyethylene glycol (MIRALAX / GLYCOLAX) packet Take 17 g by mouth daily as needed for mild constipation. 14 each 0   polyvinyl alcohol (LIQUIFILM TEARS) 1.4 %  ophthalmic solution Place 2 drops into the left eye 3 (three) times daily as needed for dry eyes. 15 mL 0   pravastatin (PRAVACHOL) 20 MG tablet Take 20 mg by mouth at bedtime.      No current facility-administered medications for this visit.    PHYSICAL EXAMINATION:   BP (!) 151/85    Pulse 67    Temp 98.7 F (37.1 C)    Resp 20    Wt 174 lb 12.8 oz (79.3 kg)    SpO2 100%    BMI 27.38 kg/m   Filed Weights   12/23/21 1445  Weight: 174 lb 12.8 oz (79.3 kg)    Physical Exam HENT:     Head: Normocephalic and atraumatic.     Mouth/Throat:     Pharynx: No oropharyngeal exudate.  Eyes:     Pupils: Pupils are equal, round, and reactive to light.  Cardiovascular:     Rate and Rhythm: Normal rate and regular rhythm.  Pulmonary:     Effort: No respiratory distress.     Breath sounds: No wheezing.  Abdominal:     General: Bowel sounds are normal. There is no distension.     Palpations: Abdomen is soft. There is no mass.     Tenderness: There is no abdominal tenderness. There is no guarding or rebound.  Musculoskeletal:        General: No tenderness. Normal range of motion.     Cervical back: Normal range of motion and neck supple.  Skin:    General: Skin is warm.  Neurological:     Mental Status: He is alert and oriented to person, place, and time.  Psychiatric:        Mood and Affect: Affect normal.     LABORATORY DATA:  I have reviewed the data as listed    Component Value Date/Time   NA 134 (L) 10/03/2021 1521   NA 131 (L) 03/17/2015 1425   K 4.2 10/03/2021 1521   K 3.9 03/17/2015 1425   CL 98 10/03/2021 1521   CL 101 03/17/2015 1425   CO2 30 10/03/2021 1521   CO2 26 03/17/2015 1425   GLUCOSE 98 10/03/2021 1521   GLUCOSE 161 (H) 03/17/2015 1425   BUN 15 10/03/2021 1521   BUN 17 03/17/2015 1425   CREATININE 0.81 10/03/2021 1521   CREATININE 0.90 03/17/2015 1425   CALCIUM 8.8 (L) 10/03/2021 1521   CALCIUM 8.5 (L) 03/17/2015 1425   PROT 7.4 10/03/2021 1521    PROT 6.7 03/17/2015 1425   ALBUMIN 3.7 10/03/2021 1521   ALBUMIN 3.7 03/17/2015 1425   AST 19 10/03/2021 1521   AST 25 03/17/2015 1425   ALT 16 10/03/2021 1521   ALT 23 03/17/2015 1425   ALKPHOS 86 10/03/2021 1521   ALKPHOS 44 03/17/2015 1425  BILITOT 0.5 10/03/2021 1521   BILITOT 0.6 03/17/2015 1425   GFRNONAA >60 10/03/2021 1521   GFRNONAA >60 03/17/2015 1425   GFRAA >60 08/02/2020 1322   GFRAA >60 03/17/2015 1425    No results found for: SPEP, UPEP  Lab Results  Component Value Date   WBC 8.7 12/23/2021   NEUTROABS 6.1 12/23/2021   HGB 14.0 12/23/2021   HCT 41.3 12/23/2021   MCV 99.0 12/23/2021   PLT 391 12/23/2021      Chemistry      Component Value Date/Time   NA 134 (L) 10/03/2021 1521   NA 131 (L) 03/17/2015 1425   K 4.2 10/03/2021 1521   K 3.9 03/17/2015 1425   CL 98 10/03/2021 1521   CL 101 03/17/2015 1425   CO2 30 10/03/2021 1521   CO2 26 03/17/2015 1425   BUN 15 10/03/2021 1521   BUN 17 03/17/2015 1425   CREATININE 0.81 10/03/2021 1521   CREATININE 0.90 03/17/2015 1425      Component Value Date/Time   CALCIUM 8.8 (L) 10/03/2021 1521   CALCIUM 8.5 (L) 03/17/2015 1425   ALKPHOS 86 10/03/2021 1521   ALKPHOS 44 03/17/2015 1425   AST 19 10/03/2021 1521   AST 25 03/17/2015 1425   ALT 16 10/03/2021 1521   ALT 23 03/17/2015 1425   BILITOT 0.5 10/03/2021 1521   BILITOT 0.6 03/17/2015 1425       ASSESSMENT & PLAN:   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  Platelets 391- STABLE  # Hemolytic anemia hemoglobin -14 - LDH normal.  Continue CellCept 1000 milligrams in the morning 500 mg at night- STABLE  # Rising PSA- 10- follow ups with [facility]  # DISPOSITION: # labs q 4 weeks- cbc;  # follow up in 4 months/labs-cbc/cmp/LDH; haptoglobin- Dr.B      [name], MD 12/23/2021 5:11 PM

## 2021-12-26 DIAGNOSIS — D0462 Carcinoma in situ of skin of left upper limb, including shoulder: Secondary | ICD-10-CM | POA: Diagnosis not present

## 2022-01-10 DIAGNOSIS — Z862 Personal history of diseases of the blood and blood-forming organs and certain disorders involving the immune mechanism: Secondary | ICD-10-CM | POA: Diagnosis not present

## 2022-01-10 DIAGNOSIS — I10 Essential (primary) hypertension: Secondary | ICD-10-CM | POA: Diagnosis not present

## 2022-01-10 DIAGNOSIS — E7800 Pure hypercholesterolemia, unspecified: Secondary | ICD-10-CM | POA: Diagnosis not present

## 2022-01-10 DIAGNOSIS — R55 Syncope and collapse: Secondary | ICD-10-CM | POA: Diagnosis not present

## 2022-01-11 DIAGNOSIS — C61 Malignant neoplasm of prostate: Secondary | ICD-10-CM | POA: Diagnosis not present

## 2022-01-11 DIAGNOSIS — Z08 Encounter for follow-up examination after completed treatment for malignant neoplasm: Secondary | ICD-10-CM | POA: Diagnosis not present

## 2022-01-11 DIAGNOSIS — Z8546 Personal history of malignant neoplasm of prostate: Secondary | ICD-10-CM | POA: Diagnosis not present

## 2022-01-16 DIAGNOSIS — H40051 Ocular hypertension, right eye: Secondary | ICD-10-CM | POA: Diagnosis not present

## 2022-01-18 ENCOUNTER — Other Ambulatory Visit: Payer: Self-pay

## 2022-01-18 ENCOUNTER — Inpatient Hospital Stay: Payer: PPO | Attending: Internal Medicine

## 2022-01-18 DIAGNOSIS — D590 Drug-induced autoimmune hemolytic anemia: Secondary | ICD-10-CM | POA: Diagnosis not present

## 2022-01-18 DIAGNOSIS — D693 Immune thrombocytopenic purpura: Secondary | ICD-10-CM

## 2022-01-18 LAB — CBC WITH DIFFERENTIAL/PLATELET
Abs Immature Granulocytes: 0.03 10*3/uL (ref 0.00–0.07)
Basophils Absolute: 0 10*3/uL (ref 0.0–0.1)
Basophils Relative: 1 %
Eosinophils Absolute: 0.1 10*3/uL (ref 0.0–0.5)
Eosinophils Relative: 2 %
HCT: 41.7 % (ref 39.0–52.0)
Hemoglobin: 13.7 g/dL (ref 13.0–17.0)
Immature Granulocytes: 0 %
Lymphocytes Relative: 18 %
Lymphs Abs: 1.4 10*3/uL (ref 0.7–4.0)
MCH: 33.2 pg (ref 26.0–34.0)
MCHC: 32.9 g/dL (ref 30.0–36.0)
MCV: 101 fL — ABNORMAL HIGH (ref 80.0–100.0)
Monocytes Absolute: 0.8 10*3/uL (ref 0.1–1.0)
Monocytes Relative: 10 %
Neutro Abs: 5.4 10*3/uL (ref 1.7–7.7)
Neutrophils Relative %: 69 %
Platelets: 360 10*3/uL (ref 150–400)
RBC: 4.13 MIL/uL — ABNORMAL LOW (ref 4.22–5.81)
RDW: 13 % (ref 11.5–15.5)
WBC: 7.7 10*3/uL (ref 4.0–10.5)
nRBC: 0 % (ref 0.0–0.2)

## 2022-01-19 IMAGING — CR DG WRIST COMPLETE 3+V*R*
3 series · 3 of 3 positions shown · non-contrast
Comparison: None.

CLINICAL DATA: Right wrist pain after fall.

EXAM:
RIGHT WRIST - COMPLETE 3+ VIEW

[wrist pa]
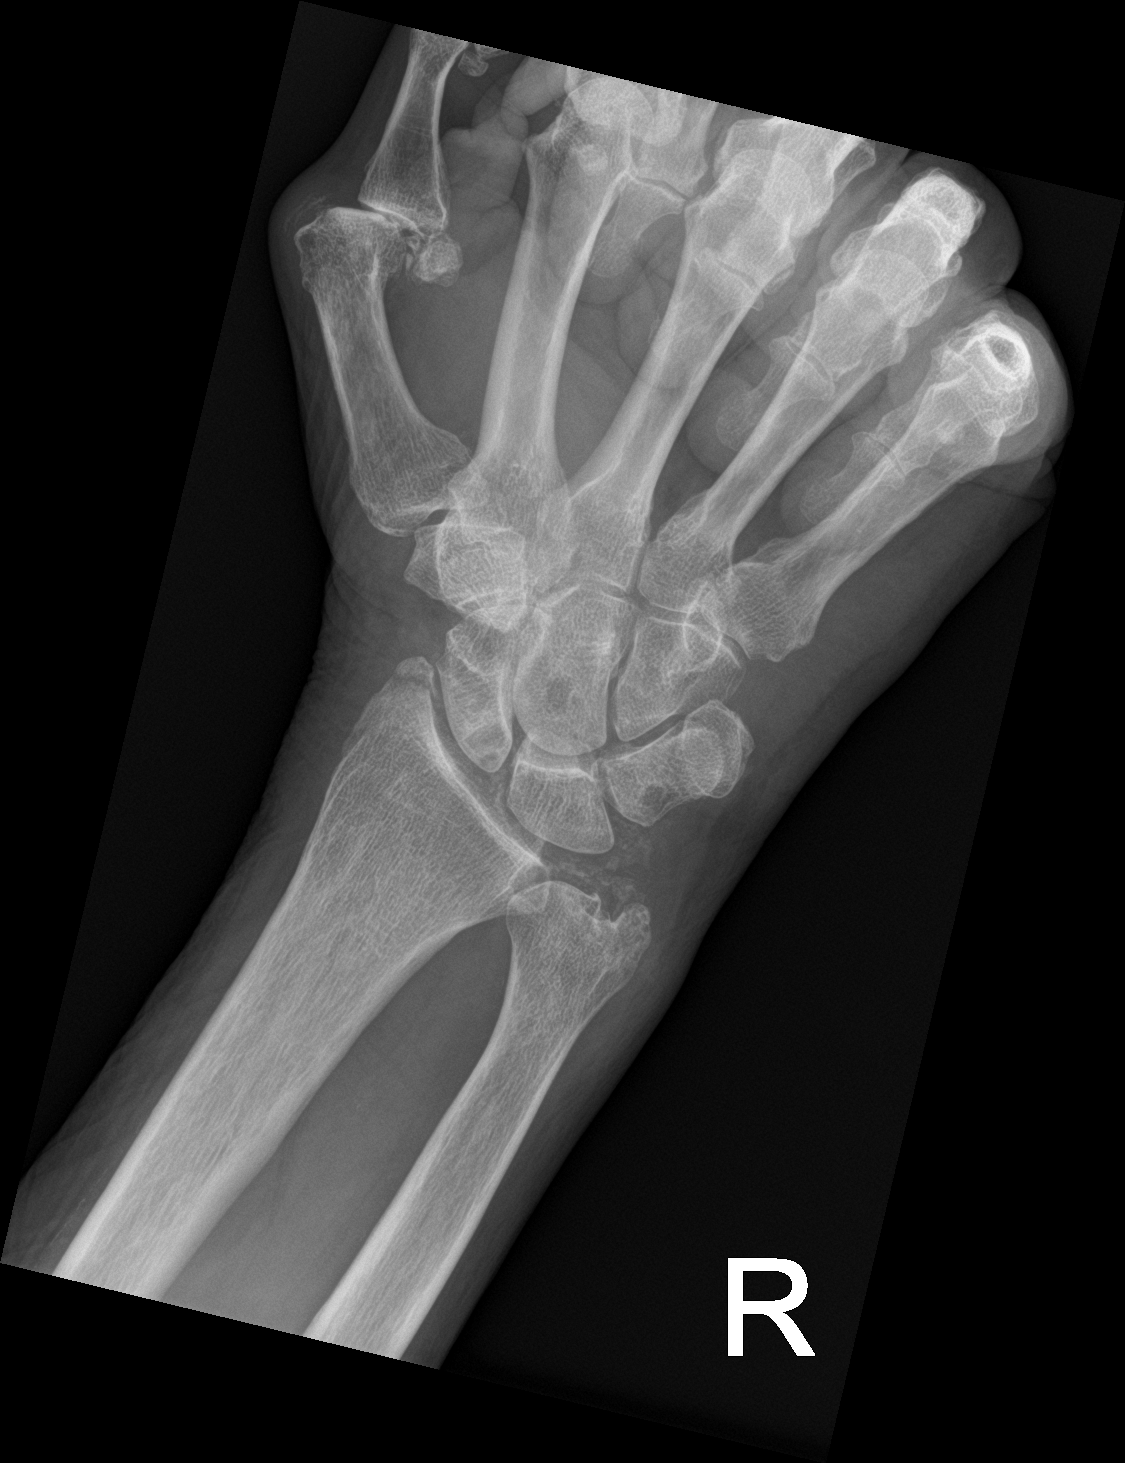

[wrist obl]
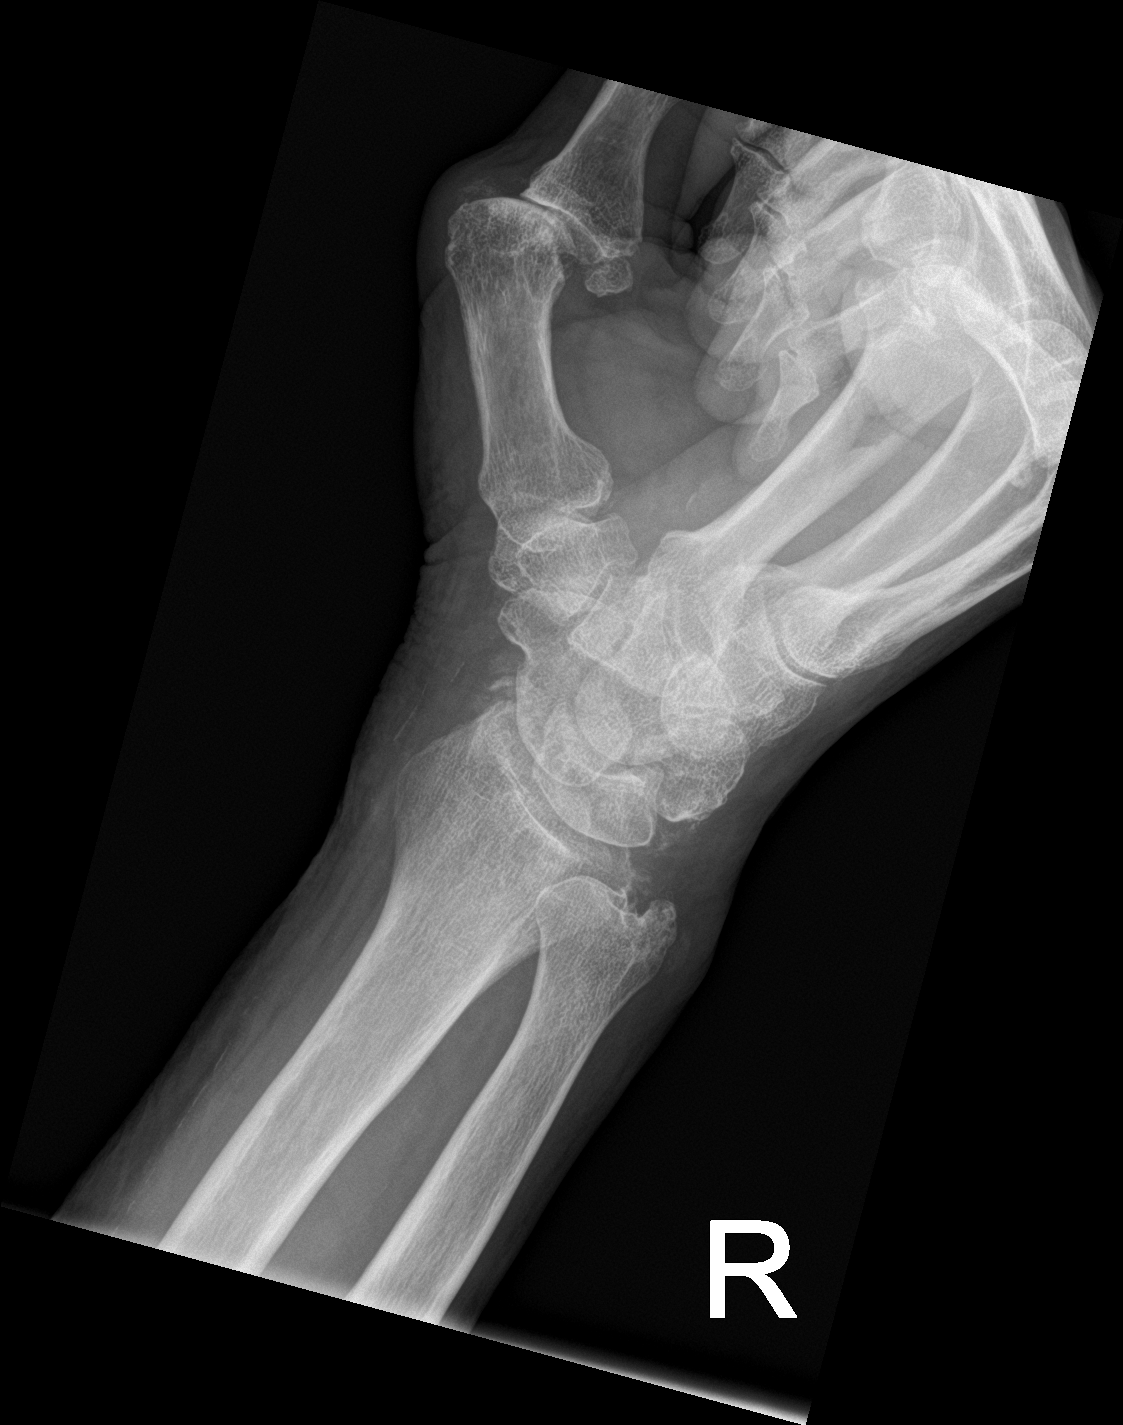

[wrist lat]
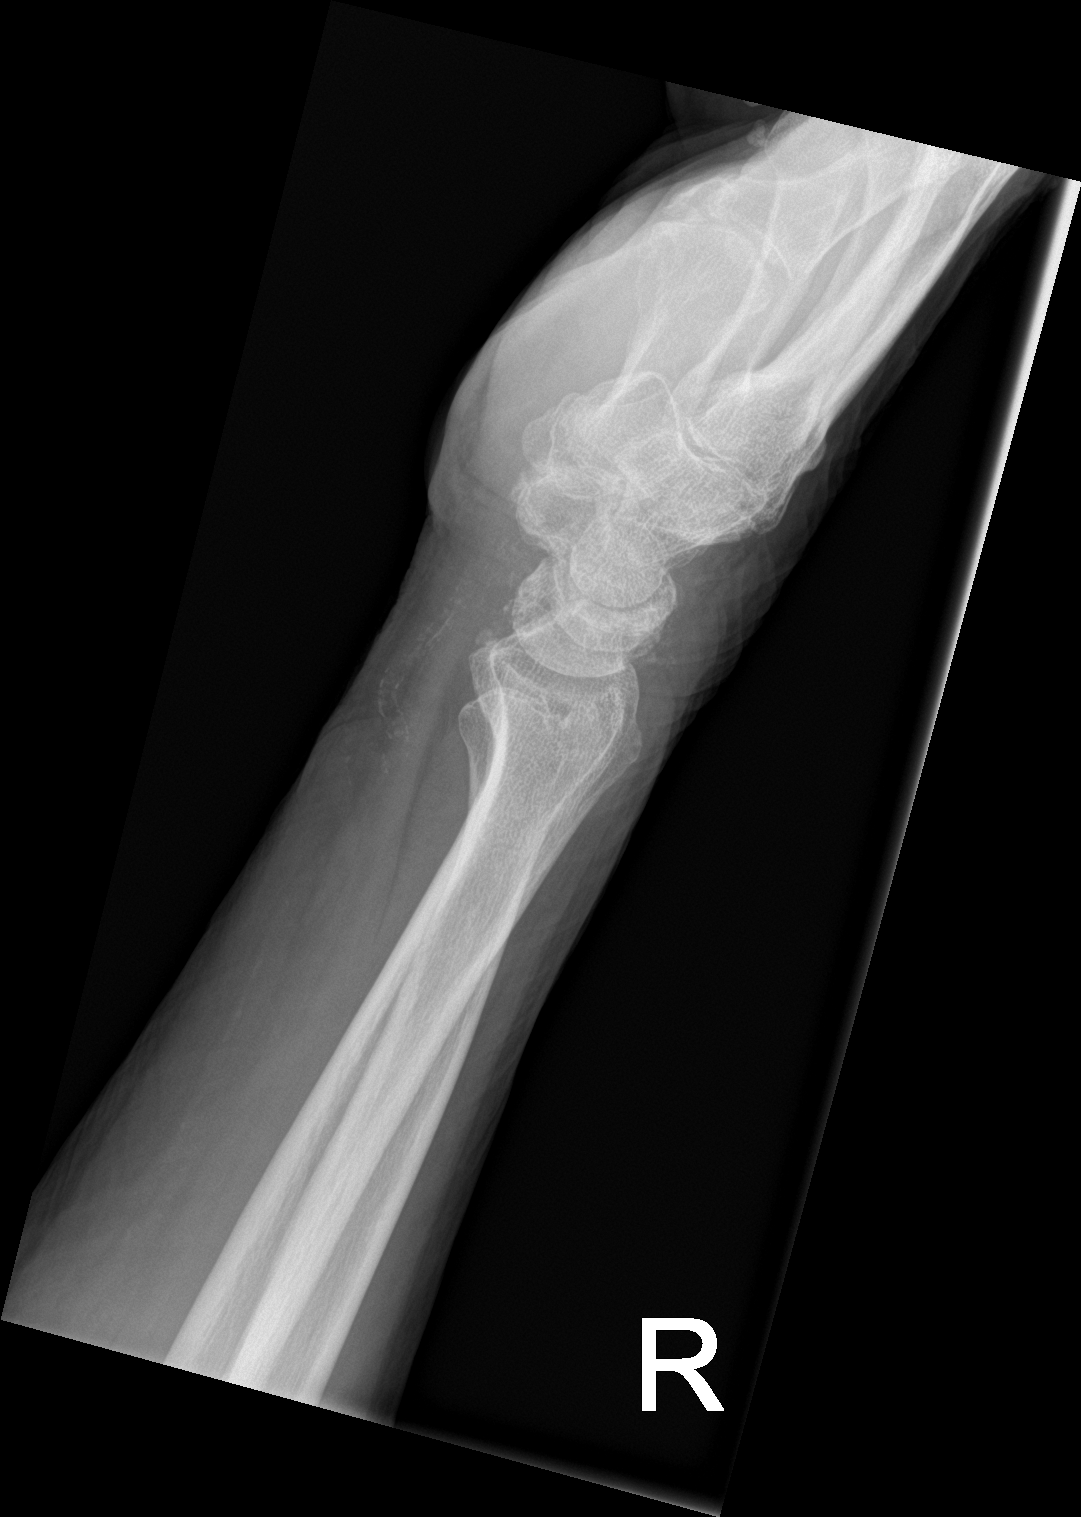

[3 of 3 positions shown; findings below may reference images not displayed]

FINDINGS: Minimally displaced radial styloid fracture is noted. Calcification
of triangular fibrocartilage is noted. Vascular calcifications are
noted.
IMPRESSION: Minimally displaced radial styloid fracture is noted

## 2022-01-19 IMAGING — CR DG ELBOW COMPLETE 3+V*R*
4 series · 4 of 4 positions shown · non-contrast
Comparison: None.

CLINICAL DATA: Right elbow pain after fall.

EXAM:
RIGHT ELBOW - COMPLETE 3+ VIEW

[elbow obl]
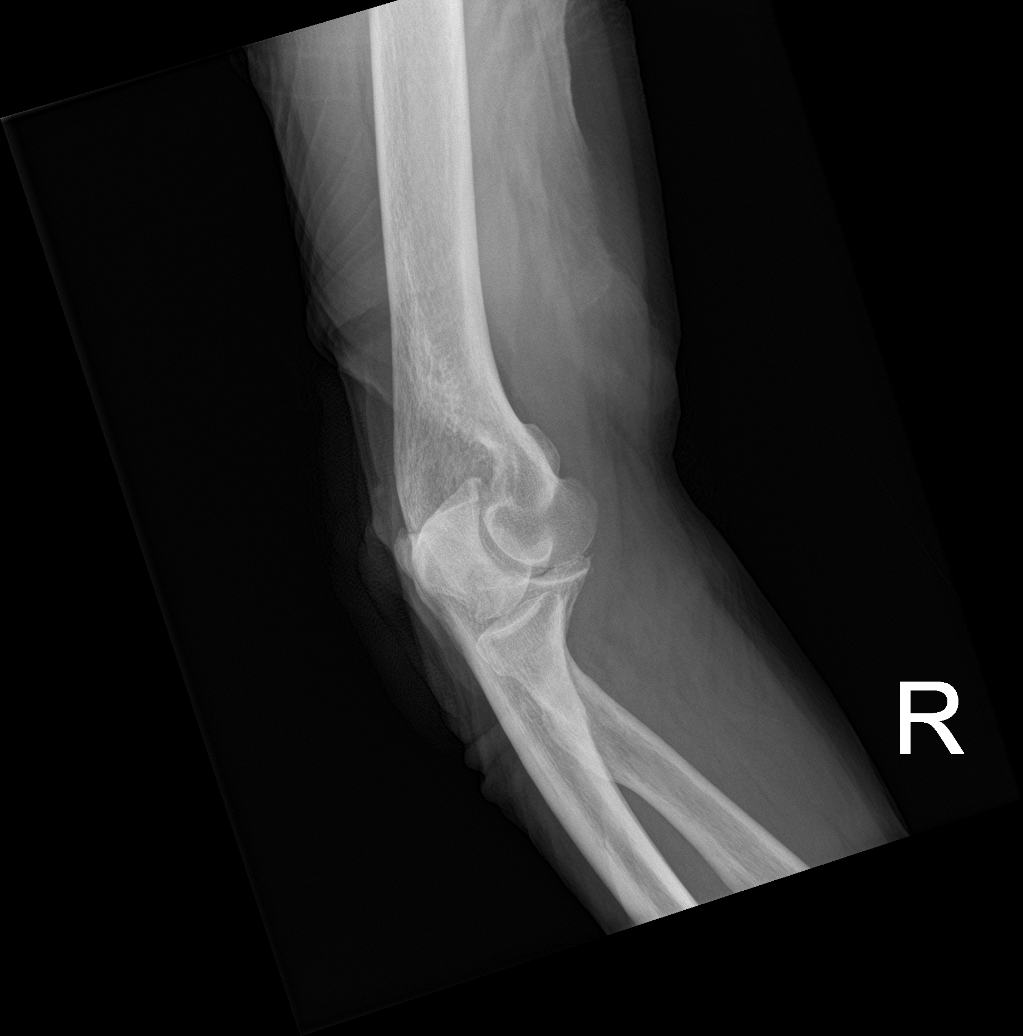

[wrist pa]
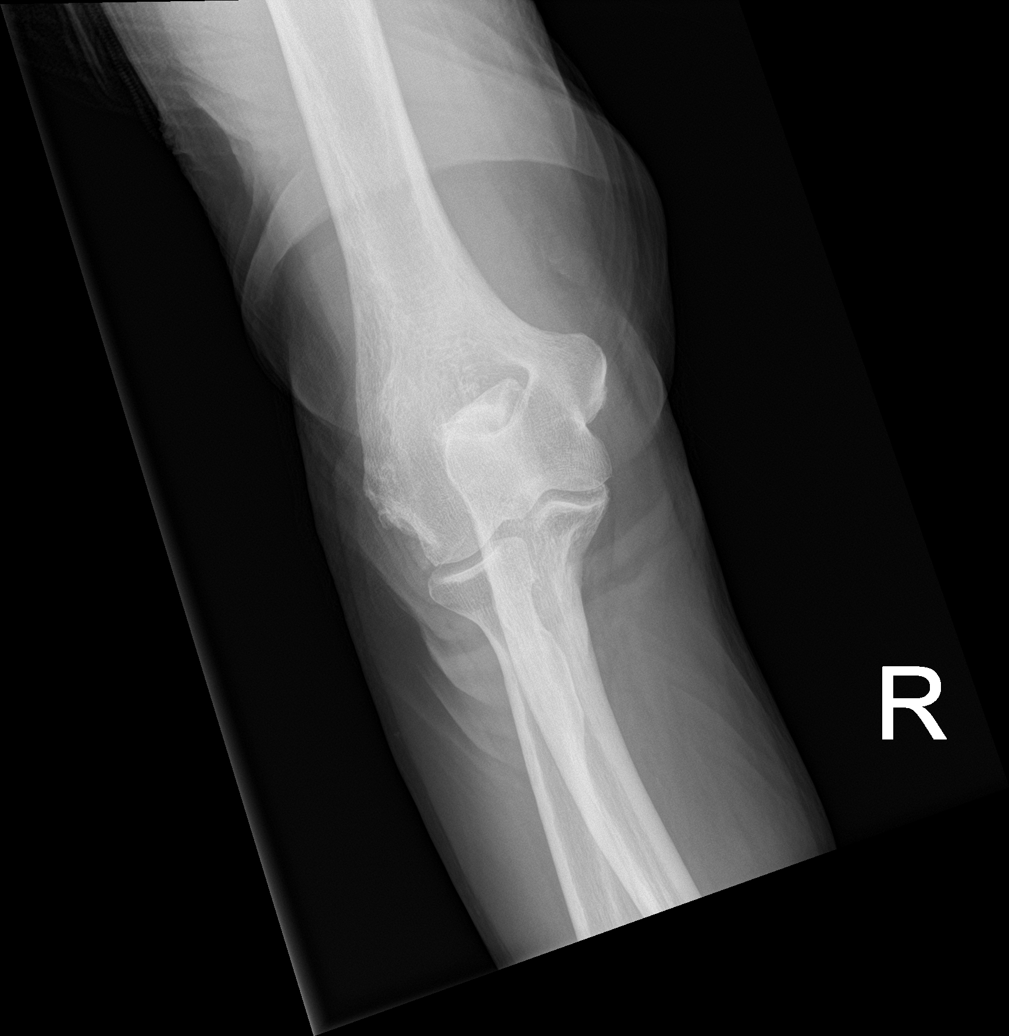

[elbow lat]
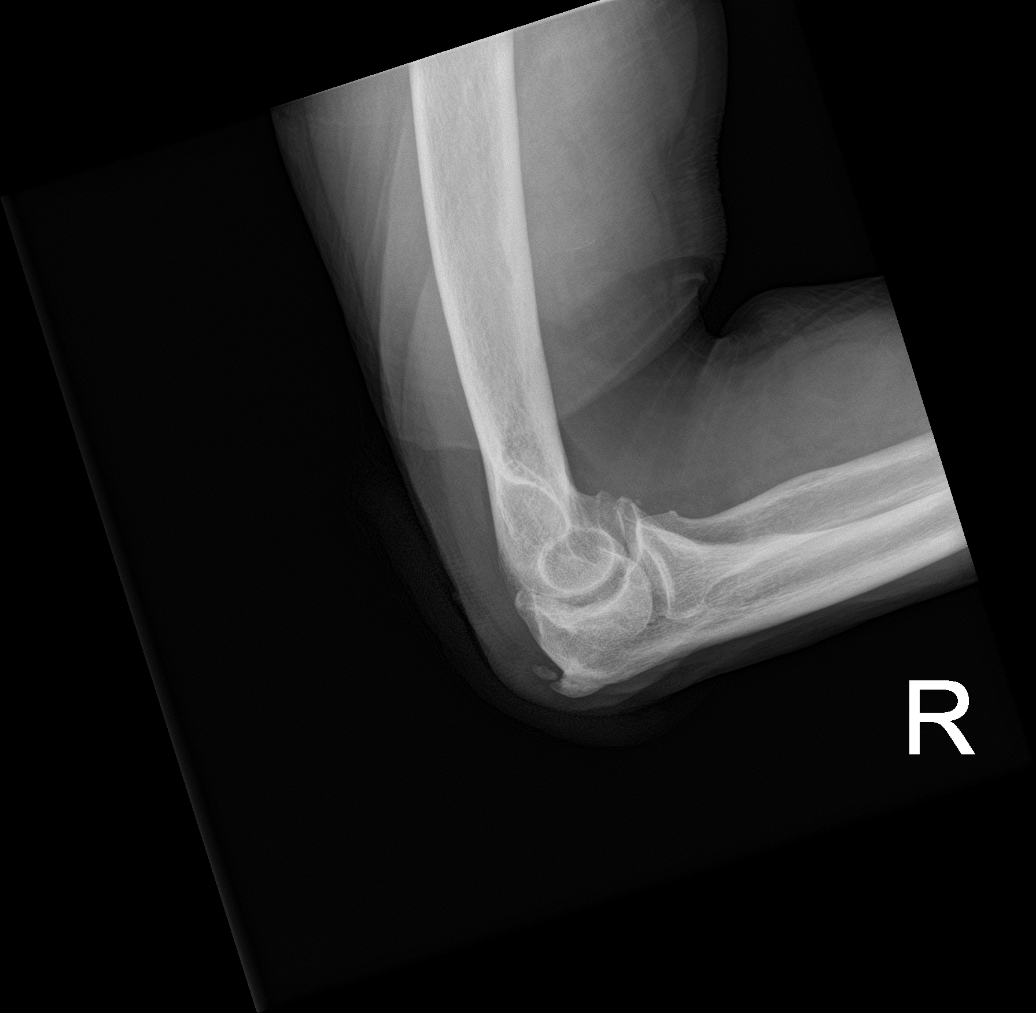

[wrist obl]
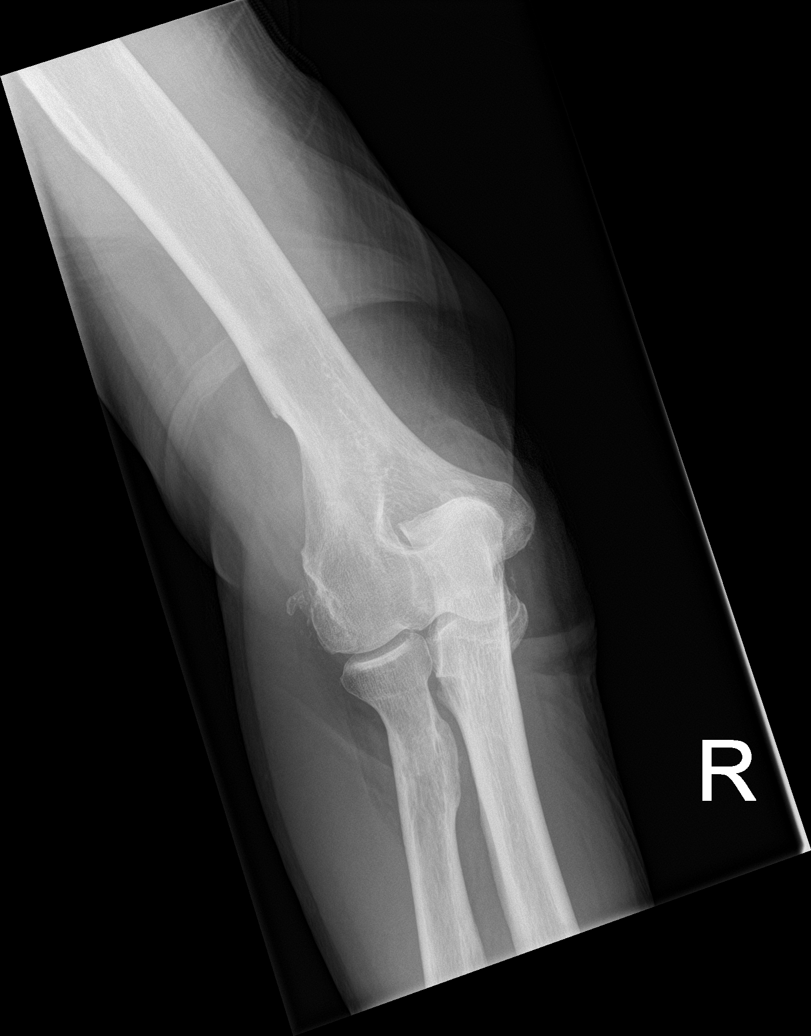

[4 of 4 positions shown; findings below may reference images not displayed]

FINDINGS: There is no evidence of fracture, dislocation, or joint effusion.
There is no evidence of arthropathy or other focal bone abnormality.
Soft tissues are unremarkable.
IMPRESSION: Negative.

## 2022-01-19 IMAGING — CT CT CERVICAL SPINE W/O CM
3 of 4 series · 12 of 33 positions shown, 14 images · non-contrast
Comparison: None.

CLINICAL DATA: Tripped and fell face first, facial laceration

EXAM:
CT CERVICAL SPINE WITHOUT CONTRAST
TECHNIQUE: Multidetector CT imaging of the cervical spine was performed without
intravenous contrast. Multiplanar CT image reconstructions were also
generated.

[Series 4: sagittal bone · sagittal · 0.31mm/px · 5 of 58 slices shown, 6 images]
[im 20/58  bone]
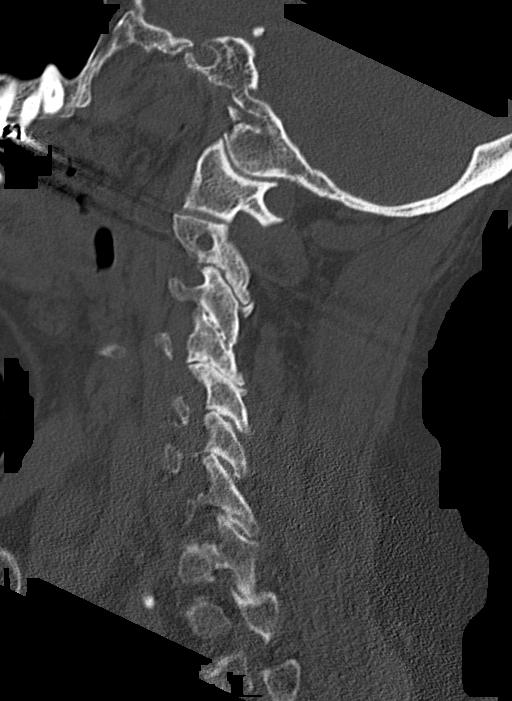
[im 24/58  bone]
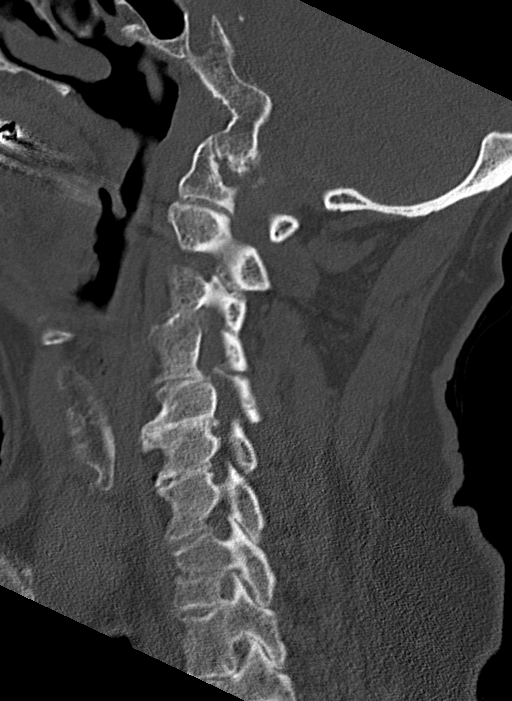
[im 29/58  soft-tissue]
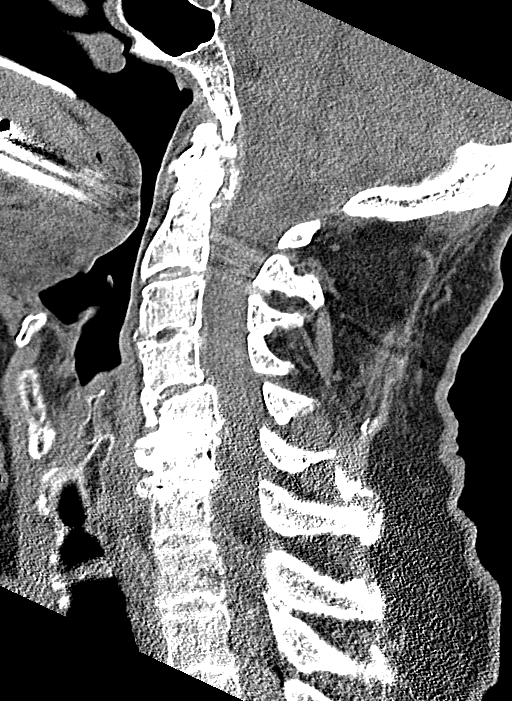
[im 29/58  bone]
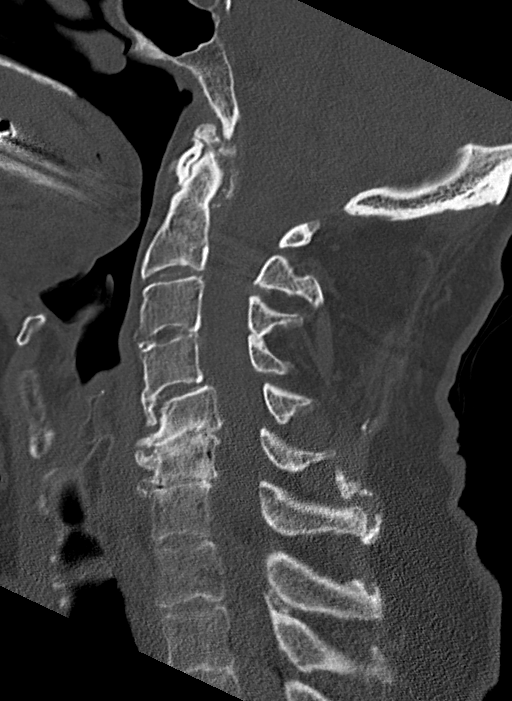
[im 34/58  bone]
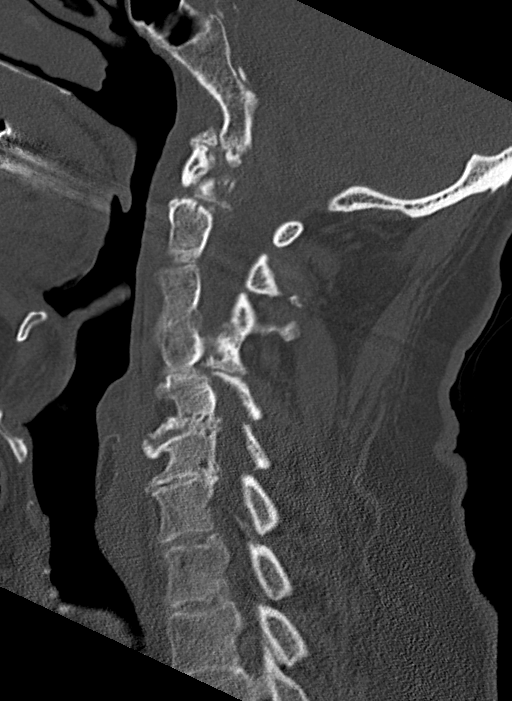
[im 39/58  bone]
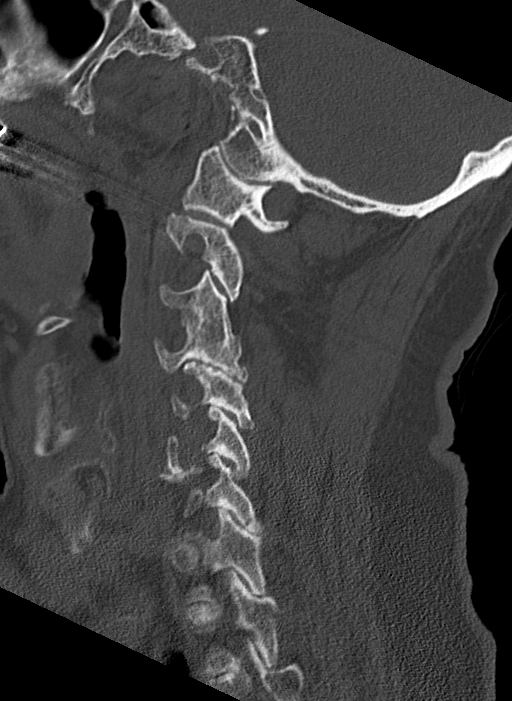

[Series 5: coronal bone · coronal · 0.27mm/px · 3 of 73 slices shown]
[im 20/73  bone]
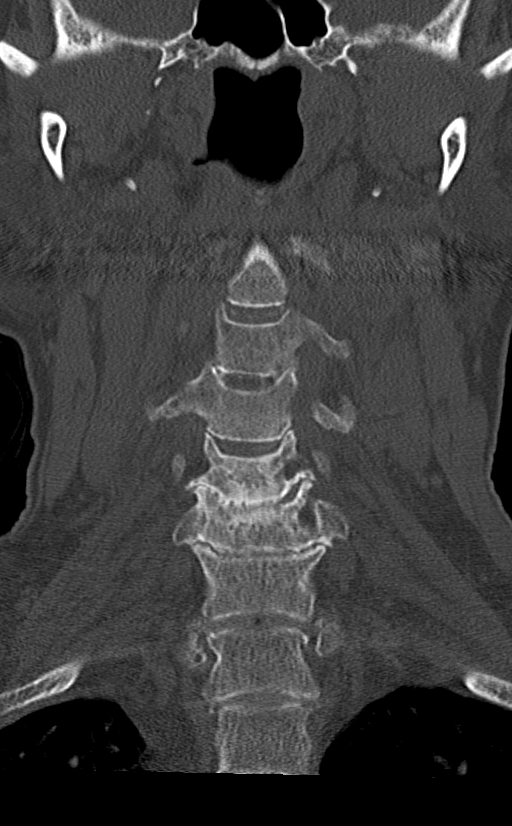
[im 31/73  bone]
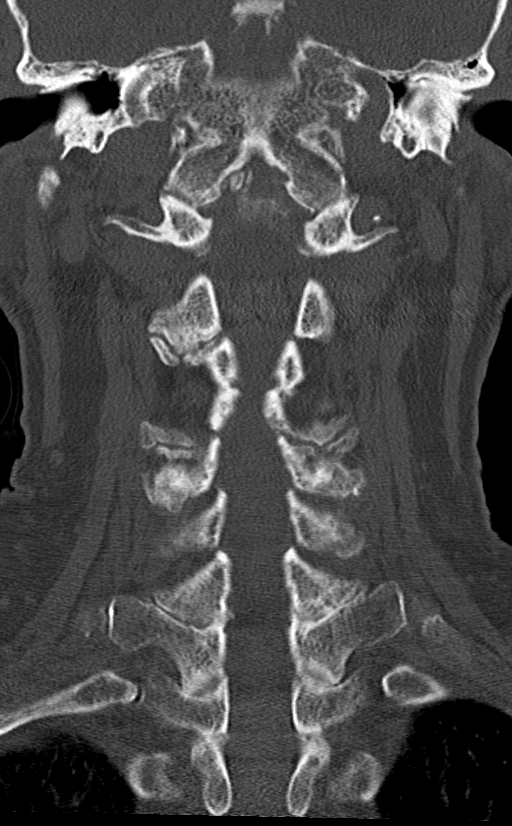
[im 42/73  bone]
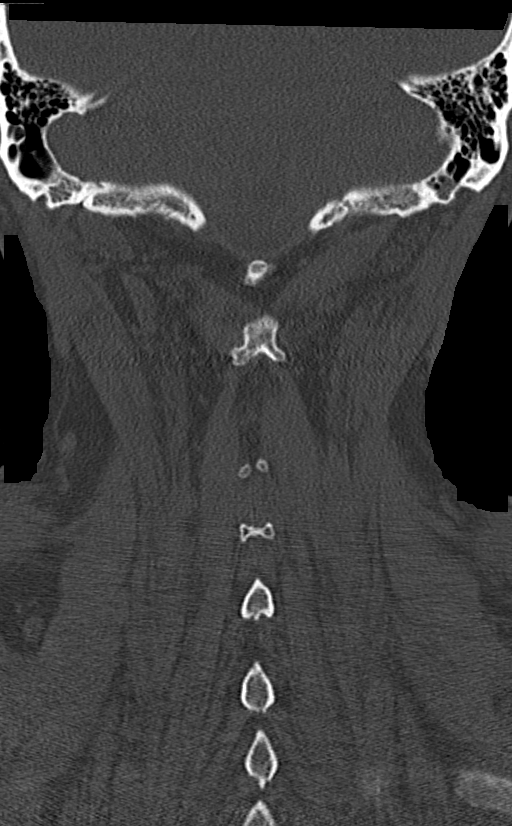

[Series 6: orthogonal axials · axial · 0.28mm/px · z∈[-278,-149]mm · 4 of 106 slices shown, 5 images]
[im 18/106  soft-tissue]
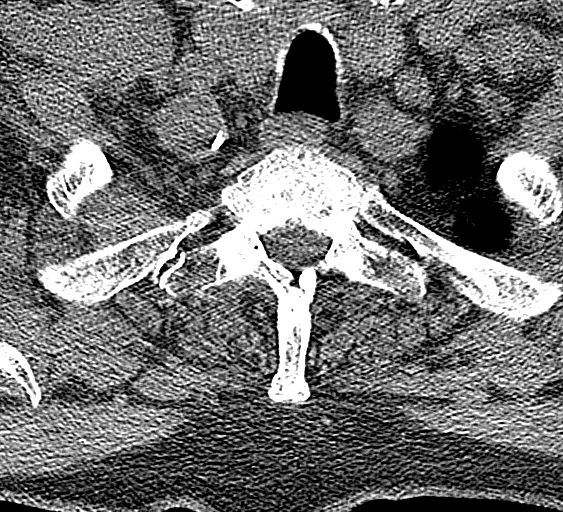
[im 18/106  bone]
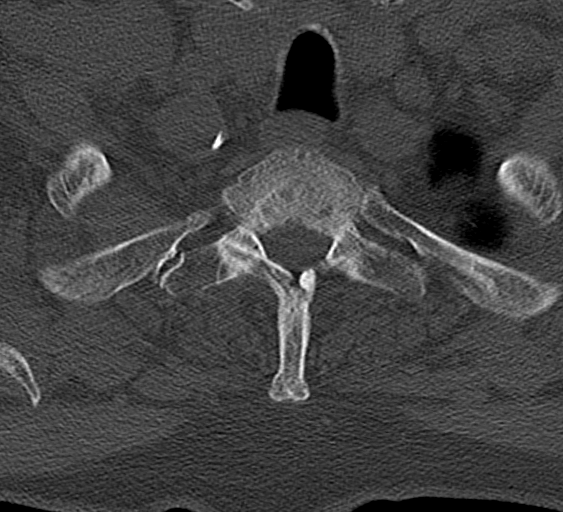
[im 36/106  bone]
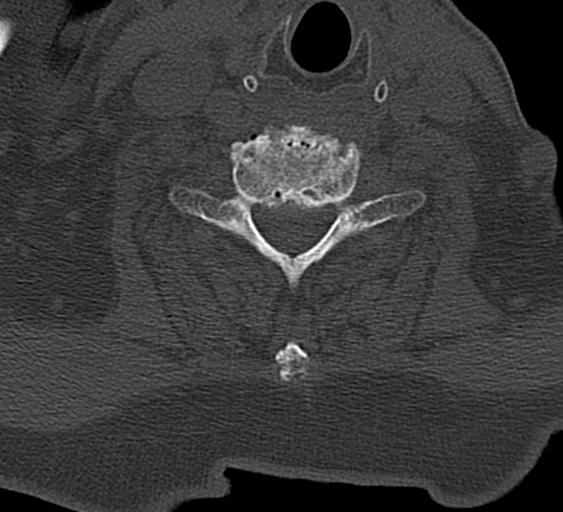
[im 71/106  bone]
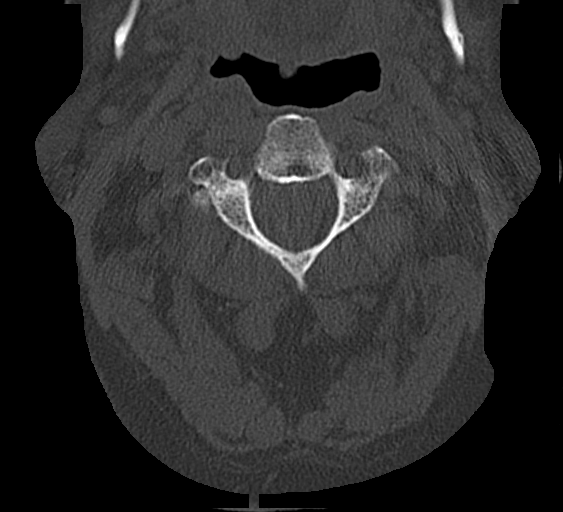
[im 88/106  bone]
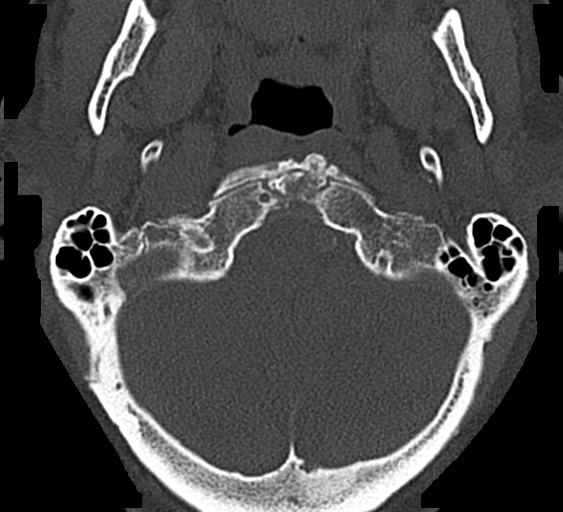

[12 of 33 positions shown; findings below may reference images not displayed]

FINDINGS: Alignment: There is minimal anterolisthesis of C4 relative to C5,
likely due to underlying degenerative changes. Otherwise alignment
is anatomic.

Skull base and vertebrae: No acute fracture. No primary bone lesion
or focal pathologic process.

Soft tissues and spinal canal: No prevertebral fluid or swelling. No
visible canal hematoma.

Disc levels: There is partial bony fusion across the C3-4 disc space
and bilateral facet joints. There is extensive lower cervical
spondylosis greatest at C5-6 and C6-7. Diffuse facet hypertrophy is
identified, greatest from C2 through C5.

Upper chest: Airway is patent. Visualized portions of the lung
apices are clear.

Other: Reconstructed images demonstrate no additional findings.
IMPRESSION: 1. Extensive multilevel cervical spondylosis and facet hypertrophy.
No acute cervical spine fracture.

## 2022-01-19 IMAGING — CT CT MAXILLOFACIAL W/O CM
3 series · 16 of 47 positions shown, 19 images · non-contrast
Comparison: None.

CLINICAL DATA: Tripped and fell, facial laceration

EXAM:
CT MAXILLOFACIAL WITHOUT CONTRAST
TECHNIQUE: Multidetector CT imaging of the maxillofacial structures was
performed. Multiplanar CT image reconstructions were also generated.

[Series 2: max soft · axial · 0.35mm/px · z∈[-204,-46]mm · 10 of 93 slices shown, 13 images]
[im 7/93  brain]
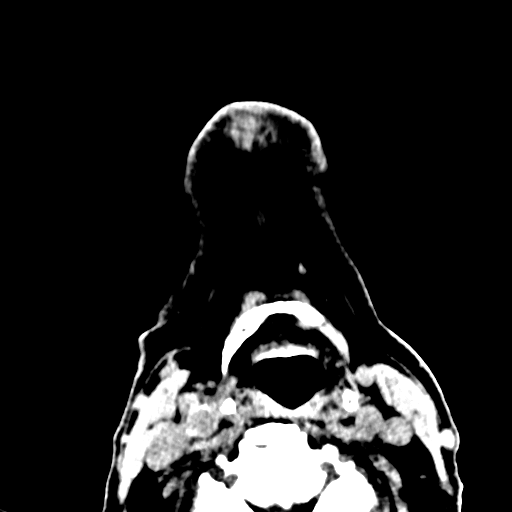
[im 7/93  bone]
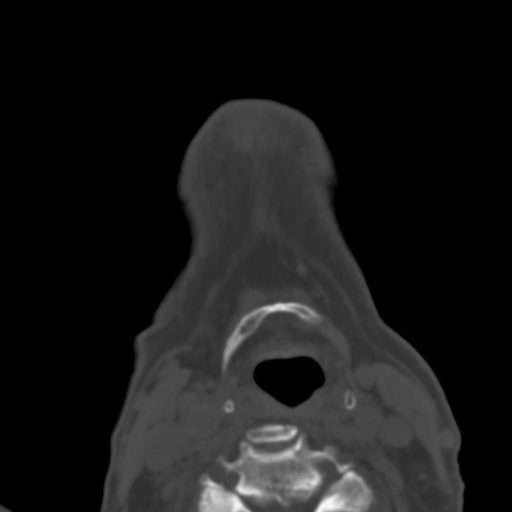
[im 16/93  bone]
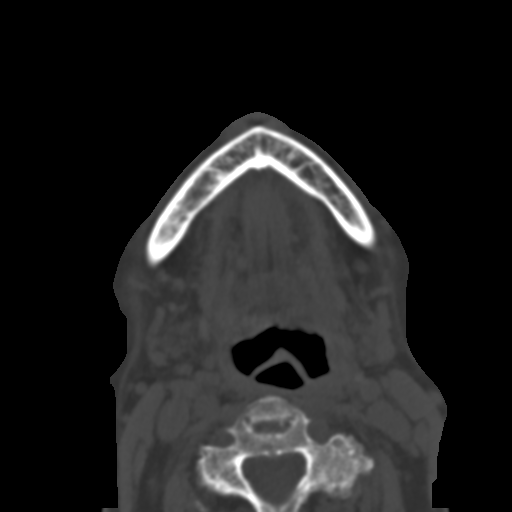
[im 26/93  bone]
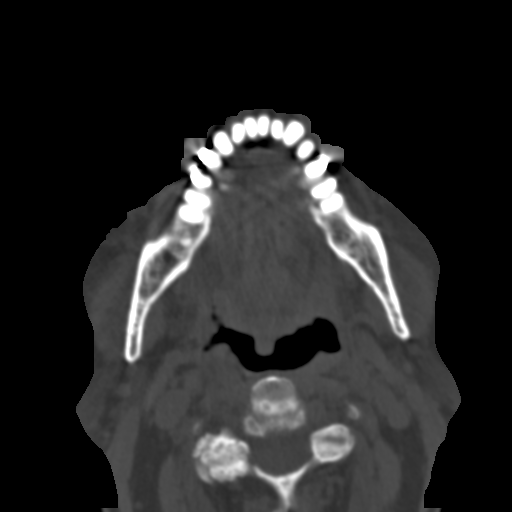
[im 32/93  bone]
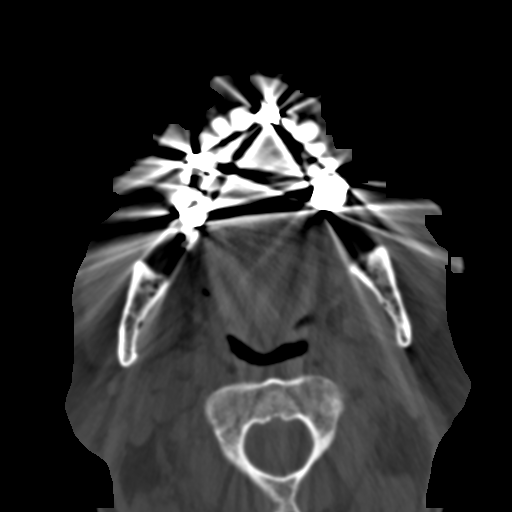
[im 42/93  brain]
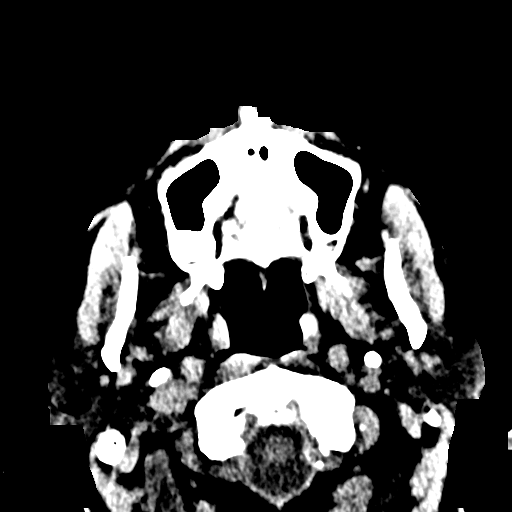
[im 42/93  bone]
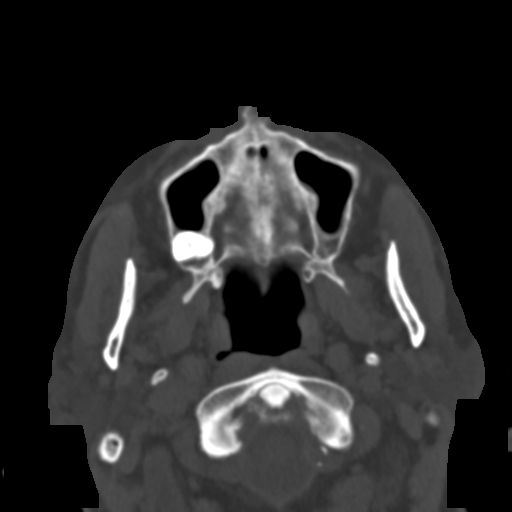
[im 51/93  bone]
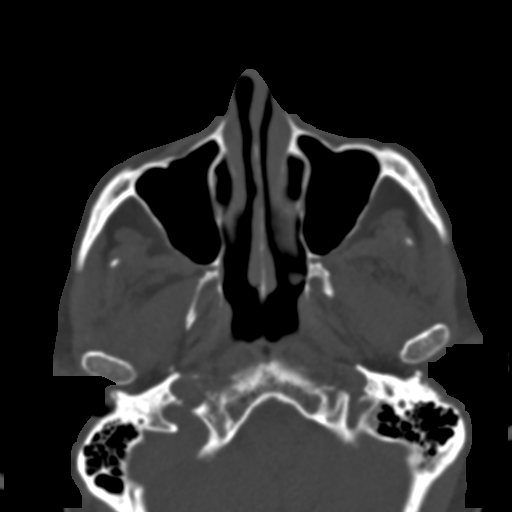
[im 61/93  bone]
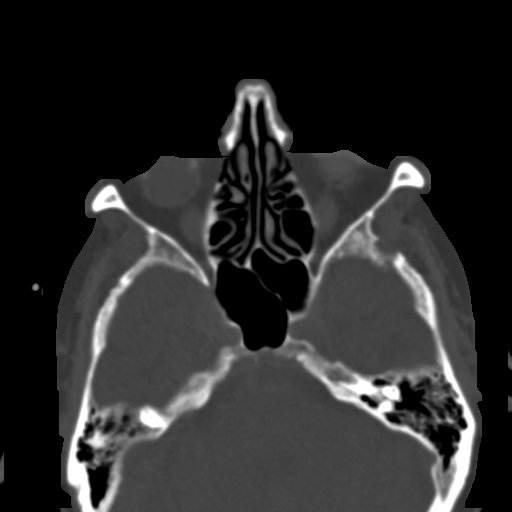
[im 70/93  bone]
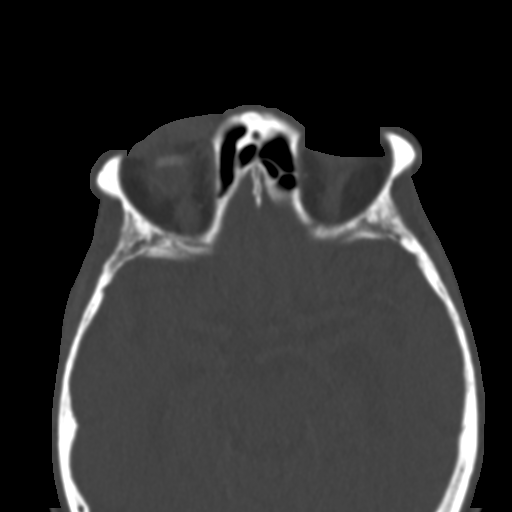
[im 77/93  brain]
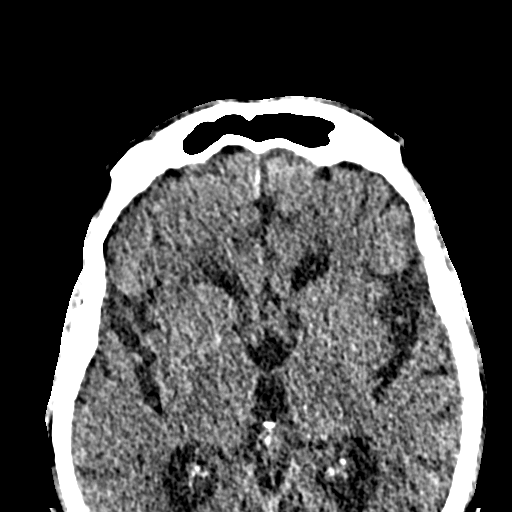
[im 77/93  bone]
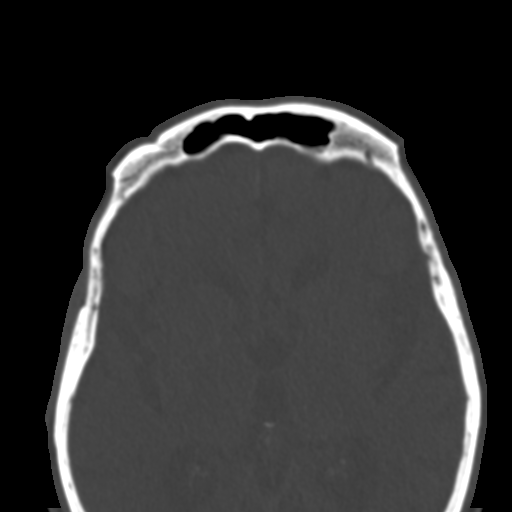
[im 86/93  bone]
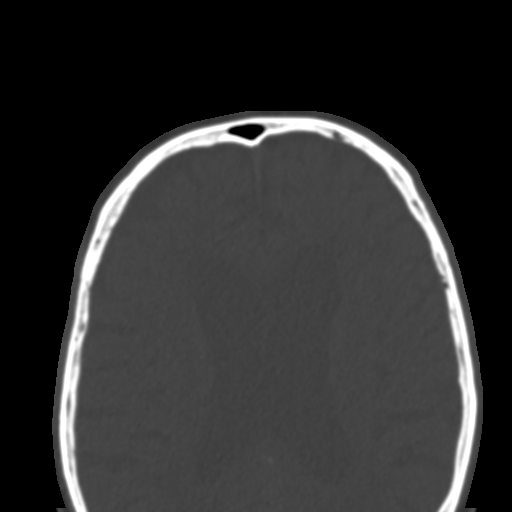

[Series 6: coronal soft · coronal · 0.37mm/px · 3 of 90 slices shown]
[im 30/90  bone]
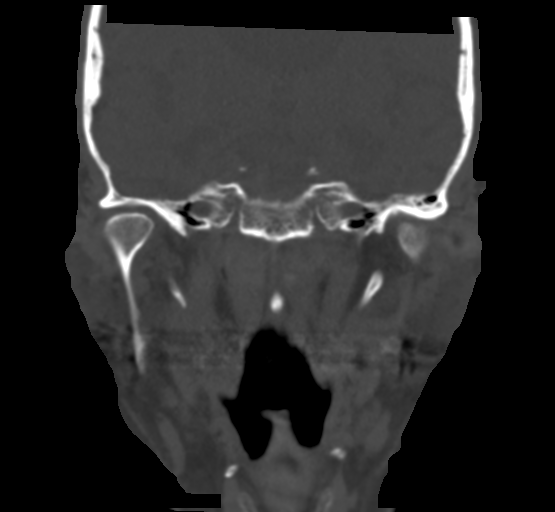
[im 40/90  bone]
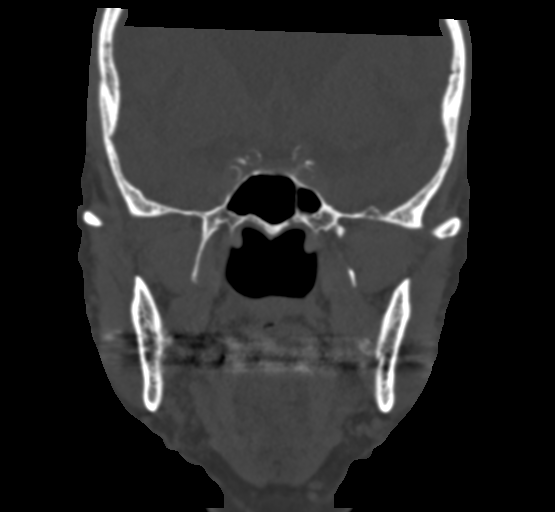
[im 50/90  bone]
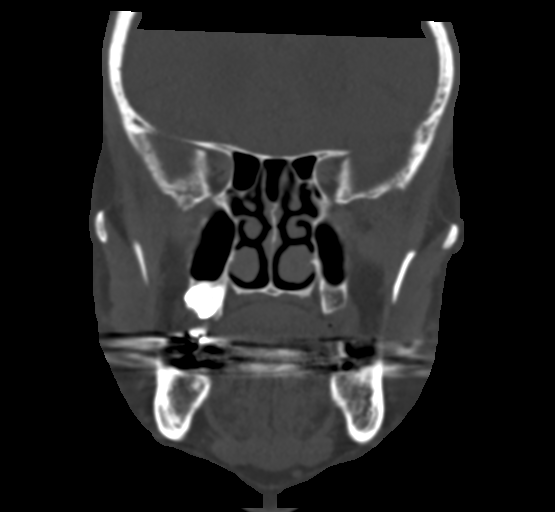

[Series 7: sagittal soft · sagittal · 0.38mm/px · 3 of 88 slices shown]
[im 30/88  bone]
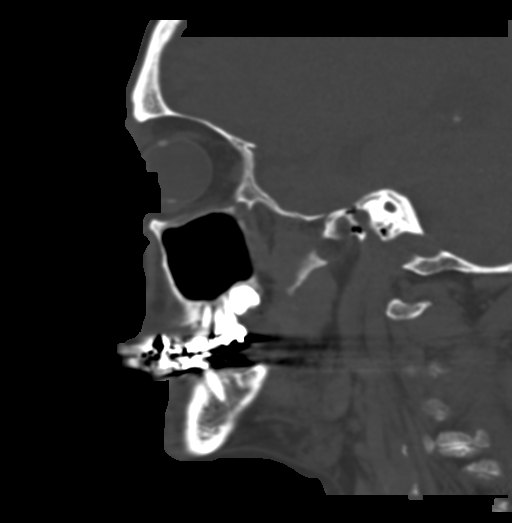
[im 44/88  bone]
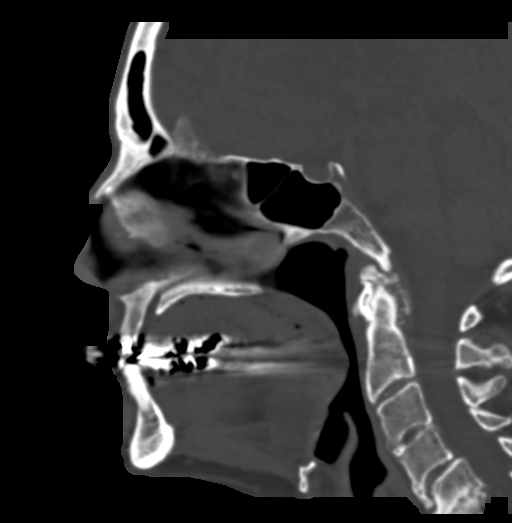
[im 59/88  bone]
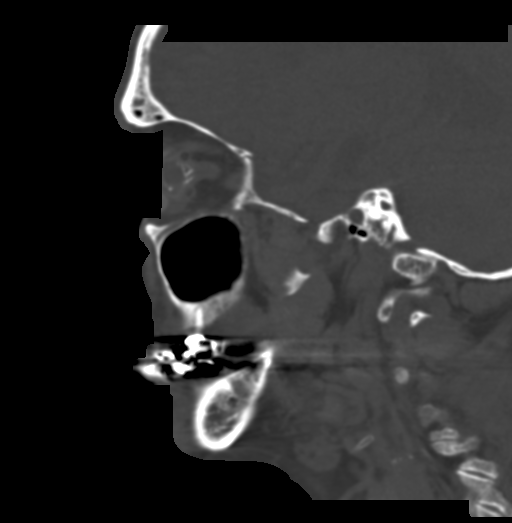

[16 of 47 positions shown; findings below may reference images not displayed]

FINDINGS: Osseous: No fracture or mandibular dislocation. No destructive
process.

Orbits: Left ocular prosthesis. Postsurgical changes right globe. No
acute intra orbital process.

Sinuses: Clear.

Soft tissues: Mild frontal scalp soft tissue swelling, greatest in
the right supraorbital region. Minimal right infraorbital soft
tissue swelling. No fluid collection or hematoma.

Limited intracranial: No significant or unexpected finding.
IMPRESSION: 1. Mild frontal scalp swelling.
2. Right periorbital soft tissue swelling.
3. No acute displaced fracture.

## 2022-02-14 ENCOUNTER — Other Ambulatory Visit: Payer: Self-pay

## 2022-02-14 ENCOUNTER — Inpatient Hospital Stay: Payer: PPO

## 2022-02-14 DIAGNOSIS — D590 Drug-induced autoimmune hemolytic anemia: Secondary | ICD-10-CM | POA: Diagnosis not present

## 2022-02-14 DIAGNOSIS — D693 Immune thrombocytopenic purpura: Secondary | ICD-10-CM

## 2022-02-14 LAB — CBC WITH DIFFERENTIAL/PLATELET
Abs Immature Granulocytes: 0.01 10*3/uL (ref 0.00–0.07)
Basophils Absolute: 0 10*3/uL (ref 0.0–0.1)
Basophils Relative: 1 %
Eosinophils Absolute: 0.2 10*3/uL (ref 0.0–0.5)
Eosinophils Relative: 3 %
HCT: 41.6 % (ref 39.0–52.0)
Hemoglobin: 14 g/dL (ref 13.0–17.0)
Immature Granulocytes: 0 %
Lymphocytes Relative: 19 %
Lymphs Abs: 1.6 10*3/uL (ref 0.7–4.0)
MCH: 33.5 pg (ref 26.0–34.0)
MCHC: 33.7 g/dL (ref 30.0–36.0)
MCV: 99.5 fL (ref 80.0–100.0)
Monocytes Absolute: 0.7 10*3/uL (ref 0.1–1.0)
Monocytes Relative: 9 %
Neutro Abs: 5.6 10*3/uL (ref 1.7–7.7)
Neutrophils Relative %: 68 %
Platelets: 332 10*3/uL (ref 150–400)
RBC: 4.18 MIL/uL — ABNORMAL LOW (ref 4.22–5.81)
RDW: 13.2 % (ref 11.5–15.5)
WBC: 8.2 10*3/uL (ref 4.0–10.5)
nRBC: 0 % (ref 0.0–0.2)

## 2022-03-14 ENCOUNTER — Inpatient Hospital Stay: Payer: PPO | Attending: Internal Medicine

## 2022-03-14 DIAGNOSIS — D590 Drug-induced autoimmune hemolytic anemia: Secondary | ICD-10-CM | POA: Diagnosis not present

## 2022-03-14 DIAGNOSIS — D693 Immune thrombocytopenic purpura: Secondary | ICD-10-CM

## 2022-03-14 LAB — CBC WITH DIFFERENTIAL/PLATELET
Abs Immature Granulocytes: 0.04 10*3/uL (ref 0.00–0.07)
Basophils Absolute: 0 10*3/uL (ref 0.0–0.1)
Basophils Relative: 1 %
Eosinophils Absolute: 0.3 10*3/uL (ref 0.0–0.5)
Eosinophils Relative: 3 %
HCT: 40.8 % (ref 39.0–52.0)
Hemoglobin: 13.8 g/dL (ref 13.0–17.0)
Immature Granulocytes: 1 %
Lymphocytes Relative: 17 %
Lymphs Abs: 1.4 10*3/uL (ref 0.7–4.0)
MCH: 33.7 pg (ref 26.0–34.0)
MCHC: 33.8 g/dL (ref 30.0–36.0)
MCV: 99.5 fL (ref 80.0–100.0)
Monocytes Absolute: 0.8 10*3/uL (ref 0.1–1.0)
Monocytes Relative: 10 %
Neutro Abs: 5.7 10*3/uL (ref 1.7–7.7)
Neutrophils Relative %: 68 %
Platelets: 356 10*3/uL (ref 150–400)
RBC: 4.1 MIL/uL — ABNORMAL LOW (ref 4.22–5.81)
RDW: 13.3 % (ref 11.5–15.5)
WBC: 8.3 10*3/uL (ref 4.0–10.5)
nRBC: 0 % (ref 0.0–0.2)

## 2022-04-11 ENCOUNTER — Inpatient Hospital Stay: Payer: PPO | Attending: Internal Medicine

## 2022-04-11 DIAGNOSIS — D590 Drug-induced autoimmune hemolytic anemia: Secondary | ICD-10-CM | POA: Insufficient documentation

## 2022-04-11 DIAGNOSIS — D693 Immune thrombocytopenic purpura: Secondary | ICD-10-CM

## 2022-04-11 LAB — CBC WITH DIFFERENTIAL/PLATELET
Abs Immature Granulocytes: 0.02 10*3/uL (ref 0.00–0.07)
Basophils Absolute: 0 10*3/uL (ref 0.0–0.1)
Basophils Relative: 0 %
Eosinophils Absolute: 0.3 10*3/uL (ref 0.0–0.5)
Eosinophils Relative: 3 %
HCT: 41.5 % (ref 39.0–52.0)
Hemoglobin: 14.1 g/dL (ref 13.0–17.0)
Immature Granulocytes: 0 %
Lymphocytes Relative: 16 %
Lymphs Abs: 1.4 10*3/uL (ref 0.7–4.0)
MCH: 33.9 pg (ref 26.0–34.0)
MCHC: 34 g/dL (ref 30.0–36.0)
MCV: 99.8 fL (ref 80.0–100.0)
Monocytes Absolute: 0.7 10*3/uL (ref 0.1–1.0)
Monocytes Relative: 9 %
Neutro Abs: 6 10*3/uL (ref 1.7–7.7)
Neutrophils Relative %: 72 %
Platelets: 372 10*3/uL (ref 150–400)
RBC: 4.16 MIL/uL — ABNORMAL LOW (ref 4.22–5.81)
RDW: 13.3 % (ref 11.5–15.5)
WBC: 8.4 10*3/uL (ref 4.0–10.5)
nRBC: 0 % (ref 0.0–0.2)

## 2022-04-12 ENCOUNTER — Other Ambulatory Visit: Payer: Self-pay | Admitting: Internal Medicine

## 2022-04-12 DIAGNOSIS — D693 Immune thrombocytopenic purpura: Secondary | ICD-10-CM

## 2022-04-12 NOTE — Telephone Encounter (Signed)
CBC with Differential Order: [id number] Status: Final result    Visible to patient: Yes (seen)    Next appt: 05/09/2022 at 02:15 PM in Oncology ([facility])    Dx: Idiopathic thrombocytopenic purpura (...    0 Result Notes           Component Ref Range & Units 1 d ago 4 wk ago 1 mo ago 2 mo ago 3 mo ago 4 mo ago 5 mo ago  WBC 4.0 - 10.5 K/uL 8.4  8.3  8.2  7.7  8.7  8.0  8.6   RBC 4.22 - 5.81 MIL/uL 4.16 Low   4.10 Low   4.18 Low   4.13 Low   4.17 Low   4.02 Low   3.91 Low    Hemoglobin 13.0 - 17.0 g/dL 14.1  13.8  14.0  13.7  14.0  13.7  13.6   HCT 39.0 - 52.0 % 41.5  40.8  41.6  41.7  41.3  40.2  39.3   MCV 80.0 - 100.0 fL 99.8  99.5  99.5  101.0 High   99.0  100.0  100.5 High    MCH 26.0 - 34.0 pg 33.9  33.7  33.5  33.2  33.6  34.1 High   34.8 High    MCHC 30.0 - 36.0 g/dL 34.0  33.8  33.7  32.9  33.9  34.1  34.6   RDW 11.5 - 15.5 % 13.3  13.3  13.2  13.0  13.2  13.4  13.3   Platelets 150 - 400 K/uL 372  356  332  360  391  332  312   nRBC 0.0 - 0.2 % 0.0  0.0  0.0  0.0  0.0  0.0  0.0   Neutrophils Relative % % 72  68  68  69  71  69  67   Neutro Abs 1.7 - 7.7 K/uL 6.0  5.7  5.6  5.4  6.1  5.5  5.7   Lymphocytes Relative % '16  17  19  18  20  19  21   '$ Lymphs Abs 0.7 - 4.0 K/uL 1.4  1.4  1.6  1.4  1.7  1.5  1.8   Monocytes Relative % '9  10  9  10  7  10  9   '$ Monocytes Absolute 0.1 - 1.0 K/uL 0.7  0.8  0.7  0.8  0.6  0.8  0.8   Eosinophils Relative % '3  3  3  2  2  2  3   '$ Eosinophils Absolute 0.0 - 0.5 K/uL 0.3  0.3  0.2  0.1  0.2  0.1  0.2   Basophils Relative % 0  '1  1  1  '$ 0  0  0   Basophils Absolute 0.0 - 0.1 K/uL 0.0  0.0  0.0  0.0  0.0  0.0  0.0   Immature Granulocytes % 0  1  0  0  0  0  0   Abs Immature Granulocytes 0.00 - 0.07 K/uL 0.02  0.04 CM  0.01 CM  0.03 CM  0.03 CM  0.03 CM  0.02 CM   Comment: Performed at [facility], [address] [place], [place] [address]  Resulting Agency  [facility] [place] [facility] [place] [facility] [place] [facility] [place] [facility] [place] [facility] [place] [facility]  LAB         Specimen Collected: 04/11/22 14:13 Last Resulted: 04/11/22 14:25

## 2022-04-25 DIAGNOSIS — E7800 Pure hypercholesterolemia, unspecified: Secondary | ICD-10-CM | POA: Diagnosis not present

## 2022-04-25 DIAGNOSIS — Z79899 Other long term (current) drug therapy: Secondary | ICD-10-CM | POA: Diagnosis not present

## 2022-05-02 DIAGNOSIS — E7800 Pure hypercholesterolemia, unspecified: Secondary | ICD-10-CM | POA: Diagnosis not present

## 2022-05-02 DIAGNOSIS — C61 Malignant neoplasm of prostate: Secondary | ICD-10-CM | POA: Diagnosis not present

## 2022-05-02 DIAGNOSIS — I10 Essential (primary) hypertension: Secondary | ICD-10-CM | POA: Diagnosis not present

## 2022-05-02 DIAGNOSIS — Z79899 Other long term (current) drug therapy: Secondary | ICD-10-CM | POA: Diagnosis not present

## 2022-05-02 DIAGNOSIS — D693 Immune thrombocytopenic purpura: Secondary | ICD-10-CM | POA: Diagnosis not present

## 2022-05-04 DIAGNOSIS — D2272 Melanocytic nevi of left lower limb, including hip: Secondary | ICD-10-CM | POA: Diagnosis not present

## 2022-05-04 DIAGNOSIS — D485 Neoplasm of uncertain behavior of skin: Secondary | ICD-10-CM | POA: Diagnosis not present

## 2022-05-04 DIAGNOSIS — L821 Other seborrheic keratosis: Secondary | ICD-10-CM | POA: Diagnosis not present

## 2022-05-04 DIAGNOSIS — D2261 Melanocytic nevi of right upper limb, including shoulder: Secondary | ICD-10-CM | POA: Diagnosis not present

## 2022-05-04 DIAGNOSIS — L570 Actinic keratosis: Secondary | ICD-10-CM | POA: Diagnosis not present

## 2022-05-04 DIAGNOSIS — D2262 Melanocytic nevi of left upper limb, including shoulder: Secondary | ICD-10-CM | POA: Diagnosis not present

## 2022-05-04 DIAGNOSIS — C4359 Malignant melanoma of other part of trunk: Secondary | ICD-10-CM | POA: Diagnosis not present

## 2022-05-04 DIAGNOSIS — D225 Melanocytic nevi of trunk: Secondary | ICD-10-CM | POA: Diagnosis not present

## 2022-05-04 DIAGNOSIS — D2271 Melanocytic nevi of right lower limb, including hip: Secondary | ICD-10-CM | POA: Diagnosis not present

## 2022-05-09 ENCOUNTER — Encounter: Payer: Self-pay | Admitting: Internal Medicine

## 2022-05-09 ENCOUNTER — Inpatient Hospital Stay (HOSPITAL_BASED_OUTPATIENT_CLINIC_OR_DEPARTMENT_OTHER): Payer: PPO | Admitting: Internal Medicine

## 2022-05-09 ENCOUNTER — Inpatient Hospital Stay: Payer: PPO | Attending: Internal Medicine

## 2022-05-09 DIAGNOSIS — D590 Drug-induced autoimmune hemolytic anemia: Secondary | ICD-10-CM

## 2022-05-09 DIAGNOSIS — D693 Immune thrombocytopenic purpura: Secondary | ICD-10-CM | POA: Diagnosis not present

## 2022-05-09 DIAGNOSIS — Z79899 Other long term (current) drug therapy: Secondary | ICD-10-CM | POA: Insufficient documentation

## 2022-05-09 LAB — COMPREHENSIVE METABOLIC PANEL
ALT: 14 U/L (ref 0–44)
AST: 20 U/L (ref 15–41)
Albumin: 3.7 g/dL (ref 3.5–5.0)
Alkaline Phosphatase: 66 U/L (ref 38–126)
Anion gap: 7 (ref 5–15)
BUN: 15 mg/dL (ref 8–23)
CO2: 26 mmol/L (ref 22–32)
Calcium: 8.6 mg/dL — ABNORMAL LOW (ref 8.9–10.3)
Chloride: 101 mmol/L (ref 98–111)
Creatinine, Ser: 0.98 mg/dL (ref 0.61–1.24)
GFR, Estimated: >60 mL/min (ref >60)
Glucose, Bld: 124 mg/dL — ABNORMAL HIGH (ref 70–99)
Potassium: 4 mmol/L (ref 3.5–5.1)
Sodium: 134 mmol/L — ABNORMAL LOW (ref 135–145)
Total Bilirubin: 0.7 mg/dL (ref 0.3–1.2)
Total Protein: 6.9 g/dL (ref 6.5–8.1)

## 2022-05-09 LAB — CBC WITH DIFFERENTIAL/PLATELET
Abs Immature Granulocytes: 0.03 10*3/uL (ref 0.00–0.07)
Basophils Absolute: 0 10*3/uL (ref 0.0–0.1)
Basophils Relative: 1 %
Eosinophils Absolute: 0.2 10*3/uL (ref 0.0–0.5)
Eosinophils Relative: 2 %
HCT: 41.2 % (ref 39.0–52.0)
Hemoglobin: 13.9 g/dL (ref 13.0–17.0)
Immature Granulocytes: 0 %
Lymphocytes Relative: 17 %
Lymphs Abs: 1.4 10*3/uL (ref 0.7–4.0)
MCH: 34 pg (ref 26.0–34.0)
MCHC: 33.7 g/dL (ref 30.0–36.0)
MCV: 100.7 fL — ABNORMAL HIGH (ref 80.0–100.0)
Monocytes Absolute: 0.8 10*3/uL (ref 0.1–1.0)
Monocytes Relative: 9 %
Neutro Abs: 5.8 10*3/uL (ref 1.7–7.7)
Neutrophils Relative %: 71 %
Platelets: 392 10*3/uL (ref 150–400)
RBC: 4.09 MIL/uL — ABNORMAL LOW (ref 4.22–5.81)
RDW: 13 % (ref 11.5–15.5)
WBC: 8.3 10*3/uL (ref 4.0–10.5)
nRBC: 0 % (ref 0.0–0.2)

## 2022-05-09 LAB — LACTATE DEHYDROGENASE: LDH: 137 U/L (ref 98–192)

## 2022-05-09 NOTE — Progress Notes (Unsigned)
[place] OFFICE PROGRESS NOTE  Patient Care Team: Derinda Late, MD as PCP - General (Family Medicine) [name], MD as Consulting Physician (Oncology)   SUMMARY OF ONCOLOGIC HISTORY:  #FEB 2016-  ITP  s/p Prednisone; OCT 2016- Relapse of ITP- Prednisone; JAN 13th- START RITUXAN q W x4 [finished Feb 8th]; March 10th 2017- N-plate q W- good response; Dec 2018- promacta 25 mg qOD.   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   # Hx of nose bleeds  INTERVAL HISTORY: Walking independently.  Accompanied by his wife.  A very pleasant 88 year old male patient with a history of autoimmune hemolytic anemia/ITP is here for follow-up.    Patient has not lost any hospitalizations.  No bleeding. No falls.  Continues to play golf.  No worsening fatigue.  Complains of joint pains.  Chronic.  Not any worse.  Review of Systems  Constitutional:  Negative for chills, diaphoresis, fever, malaise/fatigue and weight loss.  HENT:  Negative for nosebleeds and sore throat.   Eyes:  Negative for double vision.  Respiratory:  Negative for cough, hemoptysis, sputum production, shortness of breath and wheezing.   Cardiovascular:  Negative for chest pain, palpitations, orthopnea and leg swelling.  Gastrointestinal:  Negative for abdominal pain, blood in stool, constipation, diarrhea, heartburn, melena, nausea and vomiting.  Genitourinary:  Negative for dysuria, frequency and urgency.  Musculoskeletal:  Positive for back pain and joint pain.  Skin: Negative.  Negative for itching and rash.  Neurological:  Negative for dizziness, tingling, focal weakness, weakness and headaches.  Endo/Heme/Allergies:  Does not bruise/bleed easily.  Psychiatric/Behavioral:  Negative for depression. The patient is not nervous/anxious and does not have insomnia.     PAST MEDICAL HISTORY :  Past Medical History:  Diagnosis Date    Autoimmune hemolytic anemia (HCC)    Dehydration    Detached retina    Hypercholesteremia    Hypertension    Insomnia    ITP (idiopathic thrombocytopenic purpura) 09/01/2015   Leukocytosis    Prostate cancer ([place])    Shingles     PAST SURGICAL HISTORY :   Past Surgical History:  Procedure Laterality Date   artificial left eye     EYE SURGERY     HERNIA REPAIR     JOINT REPLACEMENT     PROSTATE SURGERY      FAMILY HISTORY :   Family History  Problem Relation Age of Onset   Cancer Mother    Multiple sclerosis Father     SOCIAL HISTORY:   Social History   Tobacco Use   Smoking status: Never   Smokeless tobacco: Never  Vaping Use   Vaping Use: Never used  Substance Use Topics   Alcohol use: Yes   Drug use: No    ALLERGIES:  is allergic to cephalexin and hydrocodone-acetaminophen.  MEDICATIONS:  Current Outpatient Medications  Medication Sig Dispense Refill   acetaminophen (TYLENOL) 500 MG tablet Take 500 mg by mouth every 6 (six) hours as needed for mild pain. Reported on 01/24/2016     bimatoprost (LUMIGAN) 0.03 % ophthalmic solution Place 1 drop into the right eye at bedtime.     Cyanocobalamin 1000 MCG TBCR Take 1,000 mcg by mouth daily.     eltrombopag (PROMACTA) 25 MG tablet TAKE 1 TABLET (25 MG TOTAL) BY MOUTH EVERY OTHER DAY. TAKE ON AN EMPTY STOMACH, 1 HOUR BEFORE A MEAL OR 2 HOURS AFTER. 30 tablet 4  feeding supplement, ENSURE ENLIVE, (ENSURE ENLIVE) LIQD Take 237 mLs by mouth 2 (two) times daily between meals. 237 mL 12   lisinopril (PRINIVIL,ZESTRIL) 5 MG tablet Take 10 mg by mouth daily.      meclizine (ANTIVERT) 25 MG tablet Take 1 tablet by mouth as needed for dizziness.     mycophenolate (CELLCEPT) 500 MG tablet TAKE 2 TABLETS EVERY MORNING AND TAKE 1 TABLET EVERY EVENING 90 tablet 3   omeprazole (PRILOSEC) 40 MG capsule TAKE 1 CAPSULE BY MOUTH DAILY USUALLY 30 MINUTES BEFORE BREAKFAST 90 capsule 2   polyethylene glycol (MIRALAX / GLYCOLAX) packet  Take 17 g by mouth daily as needed for mild constipation. 14 each 0   polyvinyl alcohol (LIQUIFILM TEARS) 1.4 % ophthalmic solution Place 2 drops into the left eye 3 (three) times daily as needed for dry eyes. 15 mL 0   pravastatin (PRAVACHOL) 20 MG tablet Take 20 mg by mouth at bedtime.      No current facility-administered medications for this visit.    PHYSICAL EXAMINATION:   BP 124/79 (BP Location: Left Arm, Patient Position: Sitting, Cuff Size: Normal)   Pulse 73   Temp 97.8 F (36.6 C) (Oral)   Ht '5\' 7"'$  (1.702 m)   Wt 175 lb (79.4 kg)   SpO2 100%   BMI 27.41 kg/m   Filed Weights   05/09/22 1426  Weight: 175 lb (79.4 kg)    Physical Exam HENT:     Head: Normocephalic and atraumatic.     Mouth/Throat:     Pharynx: No oropharyngeal exudate.  Eyes:     Pupils: Pupils are equal, round, and reactive to light.  Cardiovascular:     Rate and Rhythm: Normal rate and regular rhythm.  Pulmonary:     Effort: No respiratory distress.     Breath sounds: No wheezing.  Abdominal:     General: Bowel sounds are normal. There is no distension.     Palpations: Abdomen is soft. There is no mass.     Tenderness: There is no abdominal tenderness. There is no guarding or rebound.  Musculoskeletal:        General: No tenderness. Normal range of motion.     Cervical back: Normal range of motion and neck supple.  Skin:    General: Skin is warm.  Neurological:     Mental Status: He is alert and oriented to person, place, and time.  Psychiatric:        Mood and Affect: Affect normal.      LABORATORY DATA:  I have reviewed the data as listed    Component Value Date/Time   NA 134 (L) 05/09/2022 1418   NA 131 (L) 03/17/2015 1425   K 4.0 05/09/2022 1418   K 3.9 03/17/2015 1425   CL 101 05/09/2022 1418   CL 101 03/17/2015 1425   CO2 26 05/09/2022 1418   CO2 26 03/17/2015 1425   GLUCOSE 124 (H) 05/09/2022 1418   GLUCOSE 161 (H) 03/17/2015 1425   BUN 15 05/09/2022 1418   BUN 17  03/17/2015 1425   CREATININE 0.98 05/09/2022 1418   CREATININE 0.90 03/17/2015 1425   CALCIUM 8.6 (L) 05/09/2022 1418   CALCIUM 8.5 (L) 03/17/2015 1425   PROT 6.9 05/09/2022 1418   PROT 6.7 03/17/2015 1425   ALBUMIN 3.7 05/09/2022 1418   ALBUMIN 3.7 03/17/2015 1425   AST 20 05/09/2022 1418   AST 25 03/17/2015 1425   ALT 14 05/09/2022 1418   ALT 23  03/17/2015 1425   ALKPHOS 66 05/09/2022 1418   ALKPHOS 44 03/17/2015 1425   BILITOT 0.7 05/09/2022 1418   BILITOT 0.6 03/17/2015 1425   GFRNONAA >60 05/09/2022 1418   GFRNONAA >60 03/17/2015 1425   GFRAA >60 08/02/2020 1322   GFRAA >60 03/17/2015 1425    No results found for: "SPEP", "UPEP"  Lab Results  Component Value Date   WBC 8.3 05/09/2022   NEUTROABS 5.8 05/09/2022   HGB 13.9 05/09/2022   HCT 41.2 05/09/2022   MCV 100.7 (H) 05/09/2022   PLT 392 05/09/2022      Chemistry      Component Value Date/Time   NA 134 (L) 05/09/2022 1418   NA 131 (L) 03/17/2015 1425   K 4.0 05/09/2022 1418   K 3.9 03/17/2015 1425   CL 101 05/09/2022 1418   CL 101 03/17/2015 1425   CO2 26 05/09/2022 1418   CO2 26 03/17/2015 1425   BUN 15 05/09/2022 1418   BUN 17 03/17/2015 1425   CREATININE 0.98 05/09/2022 1418   CREATININE 0.90 03/17/2015 1425      Component Value Date/Time   CALCIUM 8.6 (L) 05/09/2022 1418   CALCIUM 8.5 (L) 03/17/2015 1425   ALKPHOS 66 05/09/2022 1418   ALKPHOS 44 03/17/2015 1425   AST 20 05/09/2022 1418   AST 25 03/17/2015 1425   ALT 14 05/09/2022 1418   ALT 23 03/17/2015 1425   BILITOT 0.7 05/09/2022 1418   BILITOT 0.6 03/17/2015 1425       ASSESSMENT & PLAN:   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  Platelets 392- STABLE  # Hemolytic anemia hemoglobin -14 - LDH normal. Continue CellCept 500 milligrams in the morning 500 mg at night- STABLE  # Hypocalcemia- 8.6; recommend ca+vit D once a day.   # DISPOSITION: # labs q 4 weeks- cbc;   # follow up in 4 months/labs-cbc/cmp/LDH; haptoglobin- Dr.B      [name], MD 05/10/2022 8:25 AM

## 2022-05-09 NOTE — Assessment & Plan Note (Addendum)
#  Chronic ITP currently on  Promacta 25 mg every other day.  Platelets 392- STABLE  # Hemolytic anemia hemoglobin -14 - LDH normal. Continue CellCept 500 milligrams in the morning 500 mg at night- STABLE  # Hypocalcemia- 8.6; recommend ca+vit D once a day.   # DISPOSITION: # labs q 4 weeks- cbc;  # follow up in 4 months/labs-cbc/cmp/LDH; haptoglobin- Dr.B

## 2022-05-09 NOTE — Progress Notes (Unsigned)
Would like to discuss cellcept.  C/o right knee pain 6/10.

## 2022-05-10 ENCOUNTER — Encounter: Payer: Self-pay | Admitting: Internal Medicine

## 2022-05-10 LAB — HAPTOGLOBIN: Haptoglobin: 230 mg/dL (ref 38–329)

## 2022-05-18 DIAGNOSIS — Z8546 Personal history of malignant neoplasm of prostate: Secondary | ICD-10-CM | POA: Diagnosis not present

## 2022-05-18 DIAGNOSIS — C61 Malignant neoplasm of prostate: Secondary | ICD-10-CM | POA: Diagnosis not present

## 2022-05-18 DIAGNOSIS — Z08 Encounter for follow-up examination after completed treatment for malignant neoplasm: Secondary | ICD-10-CM | POA: Diagnosis not present

## 2022-05-22 DIAGNOSIS — D225 Melanocytic nevi of trunk: Secondary | ICD-10-CM | POA: Diagnosis not present

## 2022-05-22 DIAGNOSIS — C4359 Malignant melanoma of other part of trunk: Secondary | ICD-10-CM | POA: Diagnosis not present

## 2022-06-06 ENCOUNTER — Inpatient Hospital Stay: Payer: PPO | Attending: Internal Medicine

## 2022-06-06 DIAGNOSIS — D590 Drug-induced autoimmune hemolytic anemia: Secondary | ICD-10-CM | POA: Diagnosis not present

## 2022-06-06 LAB — CBC WITH DIFFERENTIAL/PLATELET
Abs Immature Granulocytes: 0.03 10*3/uL (ref 0.00–0.07)
Basophils Absolute: 0 10*3/uL (ref 0.0–0.1)
Basophils Relative: 0 %
Eosinophils Absolute: 0.2 10*3/uL (ref 0.0–0.5)
Eosinophils Relative: 2 %
HCT: 40.4 % (ref 39.0–52.0)
Hemoglobin: 13.6 g/dL (ref 13.0–17.0)
Immature Granulocytes: 0 %
Lymphocytes Relative: 16 %
Lymphs Abs: 1.3 10*3/uL (ref 0.7–4.0)
MCH: 33.9 pg (ref 26.0–34.0)
MCHC: 33.7 g/dL (ref 30.0–36.0)
MCV: 100.7 fL — ABNORMAL HIGH (ref 80.0–100.0)
Monocytes Absolute: 0.6 10*3/uL (ref 0.1–1.0)
Monocytes Relative: 8 %
Neutro Abs: 6.1 10*3/uL (ref 1.7–7.7)
Neutrophils Relative %: 74 %
Platelets: 354 10*3/uL (ref 150–400)
RBC: 4.01 MIL/uL — ABNORMAL LOW (ref 4.22–5.81)
RDW: 13 % (ref 11.5–15.5)
WBC: 8.3 10*3/uL (ref 4.0–10.5)
nRBC: 0 % (ref 0.0–0.2)

## 2022-07-04 ENCOUNTER — Inpatient Hospital Stay: Payer: PPO | Attending: Internal Medicine

## 2022-07-04 DIAGNOSIS — D590 Drug-induced autoimmune hemolytic anemia: Secondary | ICD-10-CM | POA: Insufficient documentation

## 2022-07-04 DIAGNOSIS — D693 Immune thrombocytopenic purpura: Secondary | ICD-10-CM

## 2022-07-04 LAB — CBC WITH DIFFERENTIAL/PLATELET
Abs Immature Granulocytes: 0.05 10*3/uL (ref 0.00–0.07)
Basophils Absolute: 0.1 10*3/uL (ref 0.0–0.1)
Basophils Relative: 0 %
Eosinophils Absolute: 0.1 10*3/uL (ref 0.0–0.5)
Eosinophils Relative: 1 %
HCT: 41.1 % (ref 39.0–52.0)
Hemoglobin: 13.8 g/dL (ref 13.0–17.0)
Immature Granulocytes: 0 %
Lymphocytes Relative: 12 %
Lymphs Abs: 1.4 10*3/uL (ref 0.7–4.0)
MCH: 34.1 pg — ABNORMAL HIGH (ref 26.0–34.0)
MCHC: 33.6 g/dL (ref 30.0–36.0)
MCV: 101.5 fL — ABNORMAL HIGH (ref 80.0–100.0)
Monocytes Absolute: 1 10*3/uL (ref 0.1–1.0)
Monocytes Relative: 8 %
Neutro Abs: 9.1 10*3/uL — ABNORMAL HIGH (ref 1.7–7.7)
Neutrophils Relative %: 79 %
Platelets: 394 10*3/uL (ref 150–400)
RBC: 4.05 MIL/uL — ABNORMAL LOW (ref 4.22–5.81)
RDW: 13 % (ref 11.5–15.5)
WBC: 11.8 10*3/uL — ABNORMAL HIGH (ref 4.0–10.5)
nRBC: 0 % (ref 0.0–0.2)

## 2022-07-11 DIAGNOSIS — I10 Essential (primary) hypertension: Secondary | ICD-10-CM | POA: Diagnosis not present

## 2022-07-11 DIAGNOSIS — G453 Amaurosis fugax: Secondary | ICD-10-CM | POA: Diagnosis not present

## 2022-07-11 DIAGNOSIS — E7800 Pure hypercholesterolemia, unspecified: Secondary | ICD-10-CM | POA: Diagnosis not present

## 2022-07-11 DIAGNOSIS — R55 Syncope and collapse: Secondary | ICD-10-CM | POA: Diagnosis not present

## 2022-07-20 ENCOUNTER — Other Ambulatory Visit (HOSPITAL_COMMUNITY): Payer: Self-pay

## 2022-07-28 DIAGNOSIS — H40051 Ocular hypertension, right eye: Secondary | ICD-10-CM | POA: Diagnosis not present

## 2022-07-31 ENCOUNTER — Other Ambulatory Visit: Payer: Self-pay

## 2022-07-31 DIAGNOSIS — D693 Immune thrombocytopenic purpura: Secondary | ICD-10-CM

## 2022-07-31 MED ORDER — ELTROMBOPAG OLAMINE 25 MG PO TABS: ORAL_TABLET | ORAL | 4 refills | Status: DC

## 2022-08-01 ENCOUNTER — Inpatient Hospital Stay: Payer: PPO | Attending: Internal Medicine

## 2022-08-01 DIAGNOSIS — D590 Drug-induced autoimmune hemolytic anemia: Secondary | ICD-10-CM | POA: Insufficient documentation

## 2022-08-01 DIAGNOSIS — D693 Immune thrombocytopenic purpura: Secondary | ICD-10-CM

## 2022-08-01 LAB — CBC WITH DIFFERENTIAL/PLATELET
Abs Immature Granulocytes: 0.02 10*3/uL (ref 0.00–0.07)
Basophils Absolute: 0 10*3/uL (ref 0.0–0.1)
Basophils Relative: 1 %
Eosinophils Absolute: 0.2 10*3/uL (ref 0.0–0.5)
Eosinophils Relative: 3 %
HCT: 41.5 % (ref 39.0–52.0)
Hemoglobin: 14.2 g/dL (ref 13.0–17.0)
Immature Granulocytes: 0 %
Lymphocytes Relative: 18 %
Lymphs Abs: 1.5 10*3/uL (ref 0.7–4.0)
MCH: 34.3 pg — ABNORMAL HIGH (ref 26.0–34.0)
MCHC: 34.2 g/dL (ref 30.0–36.0)
MCV: 100.2 fL — ABNORMAL HIGH (ref 80.0–100.0)
Monocytes Absolute: 0.7 10*3/uL (ref 0.1–1.0)
Monocytes Relative: 8 %
Neutro Abs: 6 10*3/uL (ref 1.7–7.7)
Neutrophils Relative %: 70 %
Platelets: 409 10*3/uL — ABNORMAL HIGH (ref 150–400)
RBC: 4.14 MIL/uL — ABNORMAL LOW (ref 4.22–5.81)
RDW: 13 % (ref 11.5–15.5)
WBC: 8.5 10*3/uL (ref 4.0–10.5)
nRBC: 0 % (ref 0.0–0.2)

## 2022-08-02 ENCOUNTER — Telehealth: Payer: Self-pay | Admitting: Internal Medicine

## 2022-08-02 MED ORDER — ELTROMBOPAG OLAMINE 25 MG PO TABS: ORAL_TABLET | ORAL | 4 refills | Status: DC

## 2022-08-02 NOTE — Addendum Note (Signed)
Addended by: Vanice Sarah on: 08/02/2022 11:35 AM   Modules accepted: Orders

## 2022-08-02 NOTE — Telephone Encounter (Signed)
I spoke to patient/wife regarding the concerns for slightly elevated platelets.  No significant concerns for stroke.  Continue current therapy follow-up as planned in 1 month labs.

## 2022-08-02 NOTE — Telephone Encounter (Addendum)
Rx was printed on 07/31/22.  Submitting rx via escribe to pharmacy.

## 2022-08-11 ENCOUNTER — Other Ambulatory Visit (HOSPITAL_COMMUNITY): Payer: Self-pay

## 2022-08-29 ENCOUNTER — Inpatient Hospital Stay: Payer: PPO

## 2022-08-29 ENCOUNTER — Encounter: Payer: Self-pay | Admitting: Internal Medicine

## 2022-08-29 ENCOUNTER — Inpatient Hospital Stay: Payer: PPO | Attending: Internal Medicine | Admitting: Internal Medicine

## 2022-08-29 DIAGNOSIS — D590 Drug-induced autoimmune hemolytic anemia: Secondary | ICD-10-CM

## 2022-08-29 DIAGNOSIS — D693 Immune thrombocytopenic purpura: Secondary | ICD-10-CM | POA: Diagnosis not present

## 2022-08-29 DIAGNOSIS — Z79899 Other long term (current) drug therapy: Secondary | ICD-10-CM | POA: Insufficient documentation

## 2022-08-29 LAB — CBC WITH DIFFERENTIAL/PLATELET
Abs Immature Granulocytes: 0.03 10*3/uL (ref 0.00–0.07)
Basophils Absolute: 0 10*3/uL (ref 0.0–0.1)
Basophils Relative: 0 %
Eosinophils Absolute: 0.1 10*3/uL (ref 0.0–0.5)
Eosinophils Relative: 1 %
HCT: 40.2 % (ref 39.0–52.0)
Hemoglobin: 14 g/dL (ref 13.0–17.0)
Immature Granulocytes: 0 %
Lymphocytes Relative: 13 %
Lymphs Abs: 1.2 10*3/uL (ref 0.7–4.0)
MCH: 34.4 pg — ABNORMAL HIGH (ref 26.0–34.0)
MCHC: 34.8 g/dL (ref 30.0–36.0)
MCV: 98.8 fL (ref 80.0–100.0)
Monocytes Absolute: 0.7 10*3/uL (ref 0.1–1.0)
Monocytes Relative: 7 %
Neutro Abs: 7.2 10*3/uL (ref 1.7–7.7)
Neutrophils Relative %: 79 %
Platelets: 373 10*3/uL (ref 150–400)
RBC: 4.07 MIL/uL — ABNORMAL LOW (ref 4.22–5.81)
RDW: 12.9 % (ref 11.5–15.5)
WBC: 9.3 10*3/uL (ref 4.0–10.5)
nRBC: 0 % (ref 0.0–0.2)

## 2022-08-29 LAB — COMPREHENSIVE METABOLIC PANEL
ALT: 17 U/L (ref 0–44)
AST: 24 U/L (ref 15–41)
Albumin: 3.8 g/dL (ref 3.5–5.0)
Alkaline Phosphatase: 68 U/L (ref 38–126)
Anion gap: 5 (ref 5–15)
BUN: 19 mg/dL (ref 8–23)
CO2: 26 mmol/L (ref 22–32)
Calcium: 8.5 mg/dL — ABNORMAL LOW (ref 8.9–10.3)
Chloride: 103 mmol/L (ref 98–111)
Creatinine, Ser: 1.01 mg/dL (ref 0.61–1.24)
GFR, Estimated: >60 mL/min (ref >60)
Glucose, Bld: 114 mg/dL — ABNORMAL HIGH (ref 70–99)
Potassium: 3.9 mmol/L (ref 3.5–5.1)
Sodium: 134 mmol/L — ABNORMAL LOW (ref 135–145)
Total Bilirubin: 0.6 mg/dL (ref 0.3–1.2)
Total Protein: 6.7 g/dL (ref 6.5–8.1)

## 2022-08-29 LAB — LACTATE DEHYDROGENASE: LDH: 154 U/L (ref 98–192)

## 2022-08-29 NOTE — Progress Notes (Signed)
[place] OFFICE PROGRESS NOTE  Patient Care Team: Derinda Late, MD as PCP - General (Family Medicine) [name], MD as Consulting Physician (Oncology)   SUMMARY OF ONCOLOGIC HISTORY:  #FEB 2016-  ITP  s/p Prednisone; OCT 2016- Relapse of ITP- Prednisone; JAN 13th- START RITUXAN q W x4 [finished Feb 8th]; March 10th 2017- N-plate q W- good response; Dec 2018- promacta 25 mg qOD.   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   # Hx of nose bleeds  INTERVAL HISTORY: Walking independently.  Accompanied by his wife.  A very pleasant 88 year old male patient with a history of autoimmune hemolytic anemia/ITP is here for follow-up.    Patient has not lost any hospitalizations.  No bleeding. No falls.  Continues to play golf.  No worsening fatigue.  Complains of joint pains.  Chronic.  Not any worse.  Review of Systems  Constitutional:  Negative for chills, diaphoresis, fever, malaise/fatigue and weight loss.  HENT:  Negative for nosebleeds and sore throat.   Eyes:  Negative for double vision.  Respiratory:  Negative for cough, hemoptysis, sputum production, shortness of breath and wheezing.   Cardiovascular:  Negative for chest pain, palpitations, orthopnea and leg swelling.  Gastrointestinal:  Negative for abdominal pain, blood in stool, constipation, diarrhea, heartburn, melena, nausea and vomiting.  Genitourinary:  Negative for dysuria, frequency and urgency.  Musculoskeletal:  Positive for back pain and joint pain.  Skin: Negative.  Negative for itching and rash.  Neurological:  Negative for dizziness, tingling, focal weakness, weakness and headaches.  Endo/Heme/Allergies:  Does not bruise/bleed easily.  Psychiatric/Behavioral:  Negative for depression. The patient is not nervous/anxious and does not have insomnia.     PAST MEDICAL HISTORY :  Past Medical History:  Diagnosis Date    Autoimmune hemolytic anemia (HCC)    Dehydration    Detached retina    Hypercholesteremia    Hypertension    Insomnia    ITP (idiopathic thrombocytopenic purpura) 09/01/2015   Leukocytosis    Prostate cancer ([place])    Shingles     PAST SURGICAL HISTORY :   Past Surgical History:  Procedure Laterality Date   artificial left eye     EYE SURGERY     HERNIA REPAIR     JOINT REPLACEMENT     PROSTATE SURGERY      FAMILY HISTORY :   Family History  Problem Relation Age of Onset   Cancer Mother    Multiple sclerosis Father     SOCIAL HISTORY:   Social History   Tobacco Use   Smoking status: Never   Smokeless tobacco: Never  Vaping Use   Vaping Use: Never used  Substance Use Topics   Alcohol use: Yes   Drug use: No    ALLERGIES:  is allergic to cephalexin and hydrocodone-acetaminophen.  MEDICATIONS:  Current Outpatient Medications  Medication Sig Dispense Refill   acetaminophen (TYLENOL) 500 MG tablet Take 500 mg by mouth every 6 (six) hours as needed for mild pain. Reported on 01/24/2016     bimatoprost (LUMIGAN) 0.03 % ophthalmic solution Place 1 drop into the right eye at bedtime.     eltrombopag (PROMACTA) 25 MG tablet TAKE 1 TABLET (25 MG TOTAL) BY MOUTH EVERY OTHER DAY. TAKE ON AN EMPTY STOMACH, 1 HOUR BEFORE A MEAL OR 2 HOURS AFTER. 30 tablet 4   feeding supplement, ENSURE ENLIVE, (ENSURE ENLIVE) LIQD Take 237 mLs by mouth  2 (two) times daily between meals. 237 mL 12   lisinopril (PRINIVIL,ZESTRIL) 5 MG tablet Take 10 mg by mouth daily.      meclizine (ANTIVERT) 25 MG tablet Take 1 tablet by mouth as needed for dizziness.     mycophenolate (CELLCEPT) 500 MG tablet TAKE 2 TABLETS EVERY MORNING AND TAKE 1 TABLET EVERY EVENING (Patient taking differently: Take 2 tablets by mouth in the morning.) 90 tablet 3   omeprazole (PRILOSEC) 40 MG capsule TAKE 1 CAPSULE BY MOUTH DAILY USUALLY 30 MINUTES BEFORE BREAKFAST 90 capsule 2   polyethylene glycol (MIRALAX / GLYCOLAX)  packet Take 17 g by mouth daily as needed for mild constipation. 14 each 0   polyvinyl alcohol (LIQUIFILM TEARS) 1.4 % ophthalmic solution Place 2 drops into the left eye 3 (three) times daily as needed for dry eyes. 15 mL 0   pravastatin (PRAVACHOL) 20 MG tablet Take 20 mg by mouth at bedtime.      Cyanocobalamin 1000 MCG TBCR Take 1,000 mcg by mouth daily. (Patient not taking: Reported on 08/29/2022)     No current facility-administered medications for this visit.    PHYSICAL EXAMINATION:   BP 129/78 (BP Location: Left Arm, Patient Position: Sitting)   Pulse 80   Temp 98.6 F (37 C) (Tympanic)   Resp 18   Wt 176 lb 12.8 oz (80.2 kg)   BMI 27.69 kg/m   Filed Weights   08/29/22 1400  Weight: 176 lb 12.8 oz (80.2 kg)    Physical Exam HENT:     Head: Normocephalic and atraumatic.     Mouth/Throat:     Pharynx: No oropharyngeal exudate.  Eyes:     Pupils: Pupils are equal, round, and reactive to light.  Cardiovascular:     Rate and Rhythm: Normal rate and regular rhythm.  Pulmonary:     Effort: No respiratory distress.     Breath sounds: No wheezing.  Abdominal:     General: Bowel sounds are normal. There is no distension.     Palpations: Abdomen is soft. There is no mass.     Tenderness: There is no abdominal tenderness. There is no guarding or rebound.  Musculoskeletal:        General: No tenderness. Normal range of motion.     Cervical back: Normal range of motion and neck supple.  Skin:    General: Skin is warm.  Neurological:     Mental Status: He is alert and oriented to person, place, and time.  Psychiatric:        Mood and Affect: Affect normal.      LABORATORY DATA:  I have reviewed the data as listed    Component Value Date/Time   NA 134 (L) 08/29/2022 1414   NA 131 (L) 03/17/2015 1425   K 3.9 08/29/2022 1414   K 3.9 03/17/2015 1425   CL 103 08/29/2022 1414   CL 101 03/17/2015 1425   CO2 26 08/29/2022 1414   CO2 26 03/17/2015 1425   GLUCOSE 114  (H) 08/29/2022 1414   GLUCOSE 161 (H) 03/17/2015 1425   BUN 19 08/29/2022 1414   BUN 17 03/17/2015 1425   CREATININE 1.01 08/29/2022 1414   CREATININE 0.90 03/17/2015 1425   CALCIUM 8.5 (L) 08/29/2022 1414   CALCIUM 8.5 (L) 03/17/2015 1425   PROT 6.7 08/29/2022 1414   PROT 6.7 03/17/2015 1425   ALBUMIN 3.8 08/29/2022 1414   ALBUMIN 3.7 03/17/2015 1425   AST 24 08/29/2022 1414   AST  25 03/17/2015 1425   ALT 17 08/29/2022 1414   ALT 23 03/17/2015 1425   ALKPHOS 68 08/29/2022 1414   ALKPHOS 44 03/17/2015 1425   BILITOT 0.6 08/29/2022 1414   BILITOT 0.6 03/17/2015 1425   GFRNONAA >60 08/29/2022 1414   GFRNONAA >60 03/17/2015 1425   GFRAA >60 08/02/2020 1322   GFRAA >60 03/17/2015 1425    No results found for: "SPEP", "UPEP"  Lab Results  Component Value Date   WBC 9.3 08/29/2022   NEUTROABS 7.2 08/29/2022   HGB 14.0 08/29/2022   HCT 40.2 08/29/2022   MCV 98.8 08/29/2022   PLT 373 08/29/2022      Chemistry      Component Value Date/Time   NA 134 (L) 08/29/2022 1414   NA 131 (L) 03/17/2015 1425   K 3.9 08/29/2022 1414   K 3.9 03/17/2015 1425   CL 103 08/29/2022 1414   CL 101 03/17/2015 1425   CO2 26 08/29/2022 1414   CO2 26 03/17/2015 1425   BUN 19 08/29/2022 1414   BUN 17 03/17/2015 1425   CREATININE 1.01 08/29/2022 1414   CREATININE 0.90 03/17/2015 1425      Component Value Date/Time   CALCIUM 8.5 (L) 08/29/2022 1414   CALCIUM 8.5 (L) 03/17/2015 1425   ALKPHOS 68 08/29/2022 1414   ALKPHOS 44 03/17/2015 1425   AST 24 08/29/2022 1414   AST 25 03/17/2015 1425   ALT 17 08/29/2022 1414   ALT 23 03/17/2015 1425   BILITOT 0.6 08/29/2022 1414   BILITOT 0.6 03/17/2015 1425       ASSESSMENT & PLAN:   Idiopathic thrombocytopenic purpura (HCC) #Chronic ITP currently on  Promacta 25 mg every other day.  Platelets 373 STABLE  # Hemolytic anemia hemoglobin -14 - LDH normal. Continue CellCept 500 milligrams 2 pills in AM- STABLE  # Hypocalcemia- 8.5; recommend  ca+vit D once a day. STABLE.   # DISPOSITION: # labs q 4 weeks- cbc;  # follow up in 4 months/labs-cbc/cmp/LDH; haptoglobin- Dr.B      [name], MD 08/29/2022 3:29 PM

## 2022-08-29 NOTE — Assessment & Plan Note (Addendum)
#  Chronic ITP currently on  Promacta 25 mg every other day.  Platelets 373 STABLE  # Hemolytic anemia hemoglobin -14 - LDH normal. Continue CellCept 500 milligrams 2 pills in AM- STABLE  # Hypocalcemia- 8.5; recommend ca+vit D once a day. STABLE.   # DISPOSITION: # labs q 4 weeks- cbc;  # follow up in 4 months/labs-cbc/cmp/LDH; haptoglobin- Dr.B

## 2022-08-29 NOTE — Progress Notes (Signed)
Patient denies new problems/concerns today.   °

## 2022-08-31 LAB — HAPTOGLOBIN: Haptoglobin: 204 mg/dL (ref 38–329)

## 2022-09-05 ENCOUNTER — Other Ambulatory Visit: Payer: Self-pay | Admitting: Internal Medicine

## 2022-09-05 DIAGNOSIS — D693 Immune thrombocytopenic purpura: Secondary | ICD-10-CM

## 2022-09-08 DIAGNOSIS — H40051 Ocular hypertension, right eye: Secondary | ICD-10-CM | POA: Diagnosis not present

## 2022-09-11 ENCOUNTER — Encounter: Payer: Self-pay | Admitting: Internal Medicine

## 2022-09-22 ENCOUNTER — Other Ambulatory Visit (HOSPITAL_COMMUNITY): Payer: Self-pay

## 2022-09-25 ENCOUNTER — Other Ambulatory Visit: Payer: Self-pay | Admitting: *Deleted

## 2022-09-25 DIAGNOSIS — D693 Immune thrombocytopenic purpura: Secondary | ICD-10-CM

## 2022-09-26 ENCOUNTER — Inpatient Hospital Stay: Payer: PPO | Attending: Internal Medicine

## 2022-09-27 ENCOUNTER — Telehealth: Payer: Self-pay

## 2022-09-27 NOTE — Telephone Encounter (Signed)
Oral Oncology Patient Advocate Encounter   Received notification that patient is due for re-enrollment for assistance for Promacta through [employer].   Re-enrollment process has been initiated and will be submitted upon completion of necessary documents.  [employer]' phone number [phone].   I will continue to follow until final determination.  [name], [facility] Patient [place]  [phone] (phone) [phone] (fax) 09/27/2022 3:45 PM

## 2022-10-04 NOTE — Telephone Encounter (Signed)
Patient came by office to sign and drop off proof of income. I will submit full application when I receive MD signatures.   [name], [facility] Patient [place]  [phone] (phone) [phone] (fax) 10/04/2022 3:25 PM

## 2022-10-17 NOTE — Telephone Encounter (Signed)
Oral Oncology Patient Advocate Encounter   Submitted application for assistance for Promacta to [employer].   Application submitted via e-fax to [facility] phone number [phone].   I will continue to check the status until final determination.   [name], [facility] Patient [place]  [phone] (phone) [phone] (fax) 10/17/2022 10:04 AM

## 2022-10-19 DIAGNOSIS — M170 Bilateral primary osteoarthritis of knee: Secondary | ICD-10-CM | POA: Diagnosis not present

## 2022-10-24 ENCOUNTER — Inpatient Hospital Stay: Payer: PPO | Attending: Internal Medicine

## 2022-10-24 DIAGNOSIS — D693 Immune thrombocytopenic purpura: Secondary | ICD-10-CM | POA: Diagnosis not present

## 2022-10-24 LAB — CBC WITH DIFFERENTIAL/PLATELET
Abs Immature Granulocytes: 0.03 10*3/uL (ref 0.00–0.07)
Basophils Absolute: 0 10*3/uL (ref 0.0–0.1)
Basophils Relative: 0 %
Eosinophils Absolute: 0.1 10*3/uL (ref 0.0–0.5)
Eosinophils Relative: 1 %
HCT: 40.1 % (ref 39.0–52.0)
Hemoglobin: 13.5 g/dL (ref 13.0–17.0)
Immature Granulocytes: 0 %
Lymphocytes Relative: 15 %
Lymphs Abs: 1.4 10*3/uL (ref 0.7–4.0)
MCH: 33.5 pg (ref 26.0–34.0)
MCHC: 33.7 g/dL (ref 30.0–36.0)
MCV: 99.5 fL (ref 80.0–100.0)
Monocytes Absolute: 0.7 10*3/uL (ref 0.1–1.0)
Monocytes Relative: 8 %
Neutro Abs: 7.3 10*3/uL (ref 1.7–7.7)
Neutrophils Relative %: 76 %
Platelets: 460 10*3/uL — ABNORMAL HIGH (ref 150–400)
RBC: 4.03 MIL/uL — ABNORMAL LOW (ref 4.22–5.81)
RDW: 13.2 % (ref 11.5–15.5)
WBC: 9.6 10*3/uL (ref 4.0–10.5)
nRBC: 0 % (ref 0.0–0.2)

## 2022-10-26 DIAGNOSIS — Z85820 Personal history of malignant melanoma of skin: Secondary | ICD-10-CM | POA: Diagnosis not present

## 2022-10-26 DIAGNOSIS — D2261 Melanocytic nevi of right upper limb, including shoulder: Secondary | ICD-10-CM | POA: Diagnosis not present

## 2022-10-26 DIAGNOSIS — X32XXXA Exposure to sunlight, initial encounter: Secondary | ICD-10-CM | POA: Diagnosis not present

## 2022-10-26 DIAGNOSIS — Z85828 Personal history of other malignant neoplasm of skin: Secondary | ICD-10-CM | POA: Diagnosis not present

## 2022-10-26 DIAGNOSIS — L570 Actinic keratosis: Secondary | ICD-10-CM | POA: Diagnosis not present

## 2022-10-26 DIAGNOSIS — D2272 Melanocytic nevi of left lower limb, including hip: Secondary | ICD-10-CM | POA: Diagnosis not present

## 2022-10-26 DIAGNOSIS — L821 Other seborrheic keratosis: Secondary | ICD-10-CM | POA: Diagnosis not present

## 2022-10-27 DIAGNOSIS — Z79899 Other long term (current) drug therapy: Secondary | ICD-10-CM | POA: Diagnosis not present

## 2022-10-27 DIAGNOSIS — Z125 Encounter for screening for malignant neoplasm of prostate: Secondary | ICD-10-CM | POA: Diagnosis not present

## 2022-10-27 DIAGNOSIS — E7800 Pure hypercholesterolemia, unspecified: Secondary | ICD-10-CM | POA: Diagnosis not present

## 2022-11-02 DIAGNOSIS — Z0000 Encounter for general adult medical examination without abnormal findings: Secondary | ICD-10-CM | POA: Diagnosis not present

## 2022-11-02 DIAGNOSIS — Z79899 Other long term (current) drug therapy: Secondary | ICD-10-CM | POA: Diagnosis not present

## 2022-11-16 DIAGNOSIS — H40051 Ocular hypertension, right eye: Secondary | ICD-10-CM | POA: Diagnosis not present

## 2022-11-17 ENCOUNTER — Other Ambulatory Visit: Payer: Self-pay

## 2022-11-17 DIAGNOSIS — D693 Immune thrombocytopenic purpura: Secondary | ICD-10-CM

## 2022-11-21 ENCOUNTER — Inpatient Hospital Stay: Payer: PPO | Attending: Internal Medicine

## 2022-11-21 DIAGNOSIS — D693 Immune thrombocytopenic purpura: Secondary | ICD-10-CM | POA: Insufficient documentation

## 2022-11-21 LAB — CBC WITH DIFFERENTIAL/PLATELET
Abs Immature Granulocytes: 0.02 10*3/uL (ref 0.00–0.07)
Basophils Absolute: 0 10*3/uL (ref 0.0–0.1)
Basophils Relative: 0 %
Eosinophils Absolute: 0.1 10*3/uL (ref 0.0–0.5)
Eosinophils Relative: 1 %
HCT: 39.4 % (ref 39.0–52.0)
Hemoglobin: 13.7 g/dL (ref 13.0–17.0)
Immature Granulocytes: 0 %
Lymphocytes Relative: 16 %
Lymphs Abs: 1.2 10*3/uL (ref 0.7–4.0)
MCH: 34.5 pg — ABNORMAL HIGH (ref 26.0–34.0)
MCHC: 34.8 g/dL (ref 30.0–36.0)
MCV: 99.2 fL (ref 80.0–100.0)
Monocytes Absolute: 0.6 10*3/uL (ref 0.1–1.0)
Monocytes Relative: 8 %
Neutro Abs: 5.9 10*3/uL (ref 1.7–7.7)
Neutrophils Relative %: 75 %
Platelets: 238 10*3/uL (ref 150–400)
RBC: 3.97 MIL/uL — ABNORMAL LOW (ref 4.22–5.81)
RDW: 13.7 % (ref 11.5–15.5)
WBC: 7.8 10*3/uL (ref 4.0–10.5)
nRBC: 0 % (ref 0.0–0.2)

## 2022-11-27 DIAGNOSIS — R972 Elevated prostate specific antigen [PSA]: Secondary | ICD-10-CM | POA: Diagnosis not present

## 2022-12-11 NOTE — Telephone Encounter (Signed)
Called to check status of application. Informed by representative that Income Re-Determination was still under review. Informed that patient could call [phone] to request a fill of medication while that was in process. I called and informed patient. I will continue to follow and update until final determination.  [name], [facility] Patient [place]  [phone] (phone) [phone] (fax) 12/11/2022 11:19 AM

## 2022-12-26 ENCOUNTER — Inpatient Hospital Stay: Payer: PPO | Attending: Internal Medicine

## 2022-12-26 ENCOUNTER — Inpatient Hospital Stay (HOSPITAL_BASED_OUTPATIENT_CLINIC_OR_DEPARTMENT_OTHER): Payer: PPO | Admitting: Internal Medicine

## 2022-12-26 ENCOUNTER — Encounter: Payer: Self-pay | Admitting: Internal Medicine

## 2022-12-26 VITALS — BP 153/89 | HR 79 | Temp 98.5°F | Resp 17

## 2022-12-26 DIAGNOSIS — Z79899 Other long term (current) drug therapy: Secondary | ICD-10-CM | POA: Diagnosis not present

## 2022-12-26 DIAGNOSIS — Z8546 Personal history of malignant neoplasm of prostate: Secondary | ICD-10-CM | POA: Insufficient documentation

## 2022-12-26 DIAGNOSIS — D693 Immune thrombocytopenic purpura: Secondary | ICD-10-CM

## 2022-12-26 DIAGNOSIS — D591 Autoimmune hemolytic anemia, unspecified: Secondary | ICD-10-CM | POA: Insufficient documentation

## 2022-12-26 LAB — COMPREHENSIVE METABOLIC PANEL
ALT: 18 U/L (ref 0–44)
AST: 21 U/L (ref 15–41)
Albumin: 3.8 g/dL (ref 3.5–5.0)
Alkaline Phosphatase: 75 U/L (ref 38–126)
Anion gap: 8 (ref 5–15)
BUN: 16 mg/dL (ref 8–23)
CO2: 27 mmol/L (ref 22–32)
Calcium: 8.8 mg/dL — ABNORMAL LOW (ref 8.9–10.3)
Chloride: 101 mmol/L (ref 98–111)
Creatinine, Ser: 0.95 mg/dL (ref 0.61–1.24)
GFR, Estimated: >60 mL/min (ref >60)
Glucose, Bld: 101 mg/dL — ABNORMAL HIGH (ref 70–99)
Potassium: 4.5 mmol/L (ref 3.5–5.1)
Sodium: 136 mmol/L (ref 135–145)
Total Bilirubin: 0.3 mg/dL (ref 0.3–1.2)
Total Protein: 6.9 g/dL (ref 6.5–8.1)

## 2022-12-26 LAB — CBC WITH DIFFERENTIAL/PLATELET
Abs Immature Granulocytes: 0.03 10*3/uL (ref 0.00–0.07)
Basophils Absolute: 0.1 10*3/uL (ref 0.0–0.1)
Basophils Relative: 1 %
Eosinophils Absolute: 0.2 10*3/uL (ref 0.0–0.5)
Eosinophils Relative: 2 %
HCT: 41.1 % (ref 39.0–52.0)
Hemoglobin: 13.8 g/dL (ref 13.0–17.0)
Immature Granulocytes: 0 %
Lymphocytes Relative: 16 %
Lymphs Abs: 1.4 10*3/uL (ref 0.7–4.0)
MCH: 34.1 pg — ABNORMAL HIGH (ref 26.0–34.0)
MCHC: 33.6 g/dL (ref 30.0–36.0)
MCV: 101.5 fL — ABNORMAL HIGH (ref 80.0–100.0)
Monocytes Absolute: 0.6 10*3/uL (ref 0.1–1.0)
Monocytes Relative: 7 %
Neutro Abs: 6.7 10*3/uL (ref 1.7–7.7)
Neutrophils Relative %: 74 %
Platelets: 427 10*3/uL — ABNORMAL HIGH (ref 150–400)
RBC: 4.05 MIL/uL — ABNORMAL LOW (ref 4.22–5.81)
RDW: 13.6 % (ref 11.5–15.5)
WBC: 9 10*3/uL (ref 4.0–10.5)
nRBC: 0 % (ref 0.0–0.2)

## 2022-12-26 LAB — LACTATE DEHYDROGENASE: LDH: 155 U/L (ref 98–192)

## 2022-12-26 NOTE — Progress Notes (Signed)
[place] OFFICE PROGRESS NOTE  Patient Care Team: Derinda Late, MD as PCP - General (Family Medicine) [name], MD as Consulting Physician (Oncology)   SUMMARY OF ONCOLOGIC HISTORY:  #FEB 2016-  ITP  s/p Prednisone; OCT 2016- Relapse of ITP- Prednisone; JAN 13th- START RITUXAN q W x4 [finished Feb 8th]; March 10th 2017- N-plate q W- good response; Dec 2018- promacta 25 mg qOD.   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   # Hx of nose bleeds  INTERVAL HISTORY: Walking independently.  Accompanied by his wife.  A very pleasant 89 year old male patient with a history of autoimmune hemolytic anemia/ITP is here for follow-up.    Taking 1 promacta every other day. [employer] is unable to assist with patient foundation at this time due to the sale of a summer home he does not qualify for help. Pharmacy Social worker is trying to assist pt with finances regarding promacta. No side effects from promacta.  Patient had a bad fall 1 week ago coming the ball game.  Falling more frequency. His balance is worsening. Appetite is good. Denies any pain.    No bleeding. Continues to play golf.  No worsening fatigue.  Complains of joint pains.  Chronic.  Not any worse.  Review of Systems  Constitutional:  Negative for chills, diaphoresis, fever, malaise/fatigue and weight loss.  HENT:  Negative for nosebleeds and sore throat.   Eyes:  Negative for double vision.  Respiratory:  Negative for cough, hemoptysis, sputum production, shortness of breath and wheezing.   Cardiovascular:  Negative for chest pain, palpitations, orthopnea and leg swelling.  Gastrointestinal:  Negative for abdominal pain, blood in stool, constipation, diarrhea, heartburn, melena, nausea and vomiting.  Genitourinary:  Negative for dysuria, frequency and urgency.  Musculoskeletal:  Positive for back pain and joint pain.  Skin: Negative.   Negative for itching and rash.  Neurological:  Negative for dizziness, tingling, focal weakness, weakness and headaches.  Endo/Heme/Allergies:  Does not bruise/bleed easily.  Psychiatric/Behavioral:  Negative for depression. The patient is not nervous/anxious and does not have insomnia.     PAST MEDICAL HISTORY :  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   Leukocytosis    Prostate cancer ([place])    Shingles     PAST SURGICAL HISTORY :   Past Surgical History:  Procedure Laterality Date   artificial left eye     EYE SURGERY     HERNIA REPAIR     JOINT REPLACEMENT     PROSTATE SURGERY      FAMILY HISTORY :   Family History  Problem Relation Age of Onset   Cancer Mother    Multiple sclerosis Father     SOCIAL HISTORY:   Social History   Tobacco Use   Smoking status: Never   Smokeless tobacco: Never  Vaping Use   Vaping Use: Never used  Substance Use Topics   Alcohol use: Yes   Drug use: No    ALLERGIES:  is allergic to cephalexin and hydrocodone-acetaminophen.  MEDICATIONS:  Current Outpatient Medications  Medication Sig Dispense Refill   acetaminophen (TYLENOL) 500 MG tablet Take 500 mg by mouth every 6 (six) hours as needed for mild pain. Reported on 01/24/2016  Cholecalciferol (VITAMIN D3) 250 MCG (10000 UT) capsule Take 10,000 Units by mouth daily.     citalopram (CELEXA) 10 MG tablet Take 1 tablet by mouth daily.     eltrombopag (PROMACTA) 25 MG tablet TAKE 1 TABLET (25 MG TOTAL) BY MOUTH EVERY OTHER DAY. TAKE ON AN EMPTY STOMACH, 1 HOUR BEFORE A MEAL OR 2 HOURS AFTER. 30 tablet 4   latanoprost (XALATAN) 0.005 % ophthalmic solution Place 1 drop into the right eye at bedtime.     lisinopril (PRINIVIL,ZESTRIL) 5 MG tablet Take 10 mg by mouth daily.      meclizine (ANTIVERT) 25 MG tablet  Take 1 tablet by mouth as needed for dizziness.     mycophenolate (CELLCEPT) 500 MG tablet Take 2 tablets (1,000 mg total) by mouth in the morning. 60 tablet 3   omeprazole (PRILOSEC) 40 MG capsule TAKE 1 CAPSULE BY MOUTH DAILY USUALLY 30 MINUTES BEFORE BREAKFAST 90 capsule 2   polyethylene glycol (MIRALAX / GLYCOLAX) packet Take 17 g by mouth daily as needed for mild constipation. 14 each 0   pravastatin (PRAVACHOL) 20 MG tablet Take 20 mg by mouth at bedtime.      feeding supplement, ENSURE ENLIVE, (ENSURE ENLIVE) LIQD Take 237 mLs by mouth 2 (two) times daily between meals. (Patient not taking: Reported on 12/26/2022) 237 mL 12   No current facility-administered medications for this visit.    PHYSICAL EXAMINATION:   BP (!) 153/89 (BP Location: Left Arm, Patient Position: Sitting)   Pulse 79   Temp 98.5 F (36.9 C) (Tympanic)   Resp 17   SpO2 99%   There were no vitals filed for this visit.   Physical Exam HENT:     Head: Normocephalic and atraumatic.     Mouth/Throat:     Pharynx: No oropharyngeal exudate.  Eyes:     Pupils: Pupils are equal, round, and reactive to light.  Cardiovascular:     Rate and Rhythm: Normal rate and regular rhythm.  Pulmonary:     Effort: No respiratory distress.     Breath sounds: No wheezing.  Abdominal:     General: Bowel sounds are normal. There is no distension.     Palpations: Abdomen is soft. There is no mass.     Tenderness: There is no abdominal tenderness. There is no guarding or rebound.  Musculoskeletal:        General: No tenderness. Normal range of motion.     Cervical back: Normal range of motion and neck supple.  Skin:    General: Skin is warm.  Neurological:     Mental Status: He is alert and oriented to person, place, and time.  Psychiatric:        Mood and Affect: Affect normal.      LABORATORY DATA:  I have reviewed the data as listed    Component Value Date/Time   NA 136 12/26/2022 1434   NA 131 (L) 03/17/2015  1425   K 4.5 12/26/2022 1434   K 3.9 03/17/2015 1425   CL 101 12/26/2022 1434   CL 101 03/17/2015 1425   CO2 27 12/26/2022 1434   CO2 26 03/17/2015 1425   GLUCOSE 101 (H) 12/26/2022 1434   GLUCOSE 161 (H) 03/17/2015 1425   BUN 16 12/26/2022 1434   BUN 17 03/17/2015 1425   CREATININE 0.95 12/26/2022 1434   CREATININE 0.90 03/17/2015 1425   CALCIUM 8.8 (L) 12/26/2022 1434   CALCIUM 8.5 (L) 03/17/2015 1425   PROT 6.9 12/26/2022  1434   PROT 6.7 03/17/2015 1425   ALBUMIN 3.8 12/26/2022 1434   ALBUMIN 3.7 03/17/2015 1425   AST 21 12/26/2022 1434   AST 25 03/17/2015 1425   ALT 18 12/26/2022 1434   ALT 23 03/17/2015 1425   ALKPHOS 75 12/26/2022 1434   ALKPHOS 44 03/17/2015 1425   BILITOT 0.3 12/26/2022 1434   BILITOT 0.6 03/17/2015 1425   GFRNONAA >60 12/26/2022 1434   GFRNONAA >60 03/17/2015 1425   GFRAA >60 08/02/2020 1322   GFRAA >60 03/17/2015 1425    No results found for: "SPEP", "UPEP"  Lab Results  Component Value Date   WBC 9.0 12/26/2022   NEUTROABS 6.7 12/26/2022   HGB 13.8 12/26/2022   HCT 41.1 12/26/2022   MCV 101.5 (H) 12/26/2022   PLT 427 (H) 12/26/2022      Chemistry      Component Value Date/Time   NA 136 12/26/2022 1434   NA 131 (L) 03/17/2015 1425   K 4.5 12/26/2022 1434   K 3.9 03/17/2015 1425   CL 101 12/26/2022 1434   CL 101 03/17/2015 1425   CO2 27 12/26/2022 1434   CO2 26 03/17/2015 1425   BUN 16 12/26/2022 1434   BUN 17 03/17/2015 1425   CREATININE 0.95 12/26/2022 1434   CREATININE 0.90 03/17/2015 1425      Component Value Date/Time   CALCIUM 8.8 (L) 12/26/2022 1434   CALCIUM 8.5 (L) 03/17/2015 1425   ALKPHOS 75 12/26/2022 1434   ALKPHOS 44 03/17/2015 1425   AST 21 12/26/2022 1434   AST 25 03/17/2015 1425   ALT 18 12/26/2022 1434   ALT 23 03/17/2015 1425   BILITOT 0.3 12/26/2022 1434   BILITOT 0.6 03/17/2015 1425       ASSESSMENT & PLAN:   Idiopathic thrombocytopenic purpura (HCC) #Chronic ITP currently on  Promacta 25 mg  every other day. Currently  Platelets 427; recommend Monday/Thursdays given insurance issues. Continue cellcept.   # Hemolytic anemia hemoglobin -14 - LDH normal. Continue CellCept 500 milligrams 2 pills in AM- stable.   # Hx of falls- more frequently- ? Etiology. Will wait to speak to [facility] re: brain imaging. Will recommend evaluation with [facility].   # Hypocalcemia- 8.8; continue ca+vit D once a day. Stable.    # DISPOSITION: # refer to Maureen/OT re: falls # labs q 4 weeks- cbc;  # follow up in 4 months/labs-cbc/cmp/LDH; haptoglobin- Dr.B      [name], MD 12/26/2022 3:22 PM

## 2022-12-26 NOTE — Progress Notes (Signed)
Taking 1 promacta every other day. [facility] is unable to assist with patient foundation at this time due to the sale of a summer home he does not qualify for help. [facility] is trying to assist pt with finances regarding promacta.  No side effects from promacta. Had a bad fall 1 week ago. Falling more frequency. His balance is worsening. Appetite is good. Denies any pain.

## 2022-12-26 NOTE — Assessment & Plan Note (Addendum)
#  Chronic ITP currently on  Promacta 25 mg every other day. Currently  Platelets 427; recommend Monday/Thursdays given insurance issues. Continue cellcept.   # Hemolytic anemia hemoglobin -14 - LDH normal. Continue CellCept 500 milligrams 2 pills in AM- stable.   # Hx of falls- more frequently- ? Etiology. Will wait to speak to [facility] re: brain imaging. Will recommend evaluation with [facility].   # Hypocalcemia- 8.8; continue ca+vit D once a day. Stable.    # DISPOSITION: # refer to Maureen/OT re: falls # labs q 4 weeks- cbc;  # follow up in 4 months/labs-cbc/cmp/LDH; haptoglobin- Dr.B

## 2022-12-26 NOTE — Patient Instructions (Signed)
Recommend taking promacta Mondays/ Thursday- GB

## 2022-12-27 ENCOUNTER — Inpatient Hospital Stay: Payer: PPO | Admitting: Occupational Therapy

## 2022-12-27 DIAGNOSIS — R296 Repeated falls: Secondary | ICD-10-CM

## 2022-12-27 NOTE — Therapy (Signed)
[place] at [facility] [address] [place], [place], [address] Phone: [phone]   Fax:  [phone]  Occupational Therapy Screen   Patient Details  Name: [name]. MRN: [id number] Date of Birth: November 29, 1931 No data recorded  Encounter Date: 12/27/2022   OT End of Session - 12/27/22 1600     Visit Number 0             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   Leukocytosis    Prostate cancer ([place])    Shingles     Past Surgical History:  Procedure Laterality Date   artificial left eye     EYE SURGERY     HERNIA REPAIR     JOINT REPLACEMENT     PROSTATE SURGERY      There were no vitals filed for this visit.   Subjective Assessment - 12/27/22 1559     Subjective  I fell about 2 wks ago coming out from basketball game we watch and going up hill -it was dark- fell flat on my face. Probably 3 falls the last 3-6 months    Currently in Pain? No/denies             [name], MD 12/26/2022 3:22 PM ASSESSMENT & PLAN:    Idiopathic thrombocytopenic purpura ([place]) #Chronic ITP currently on  Promacta 25 mg every other day. Currently  Platelets 427; recommend Monday/Thursdays given insurance issues. Continue cellcept.    # Hemolytic anemia hemoglobin -14 - LDH normal. Continue CellCept 500 milligrams 2 pills in AM- stable.    # Hx of falls- more frequently- ? Etiology. Will wait to speak to [facility] re: brain imaging. Will recommend evaluation with [facility].    # Hypocalcemia- 8.8; continue ca+vit D once a day. Stable.     # DISPOSITION: # refer to [place] [facility]/OT re: falls # labs q 4 weeks- cbc;  # follow up in 4 months/labs-cbc/cmp/LDH; haptoglobin- Dr.B     OT SCREEN 12/27/22: Patient arrive ambulating with wife in OT  room.  No assistive devices.  Patient has 4 steps to get into the house.  One-story house.  Walk-in shower with a seat.  Does not have to need to use a seat.  Do not use any assistive devices. Patient report he used to play golf.  But not for a few months.  Patient has a history of syncope per wife. Patient had in the past right knee surgery. Patient is active range of motion and strength appear within normal limits and hips and left lower extremity. Some weakness in right knee as well as stiffness in the ankles. BERG balance test - pt score 48/56 putting patient at low risk for falling. .  Patient sometimes full or trip when fatigue and not picking up feet or when dark or uneven surfaces. Patient in agreement and prefer a few home exercises to do for a month - follow up if needed  -Patient to be active about 20 to 24 minutes a day and exercise. -Sit and stand 8-10 reps out of a firm chair not using hands. -Sidestepping at the kitchen counter 1 to 2 minutes. -Heel raises at kitchen counter wall 10 reps -Marching holding onto kitchen counter nice and slow knees up 10  reps. -Standing at sink doing hip flexion as well as abduction and extension tapping 10 reps each - doing both LE   Patient and wife in agreement patient will follow-up as needed in a month. Discussed with them at home safety as well as decrease fall risk.                                          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   Nasal bleeding 01/08/2016   Uncontrolled hypertension 01/08/2016   Idiopathic thrombocytopenic purpura ([place])  09/01/2015   Amaurosis fugax of right eye 08/10/2015   Acute renal failure ([place]) 04/06/2015   History of ITP 03/22/2015   Syncope 12/21/2014   Pure hypercholesterolemia 08/27/2014   Biceps tendon tear 04/09/2014   Cancer of prostate w/low recurrence risk (T1-2a, Gleason<7 & PSA<10) ([place]) 07/30/2012   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 MRN: [id number] Date of Birth: July 31, 1932

## 2022-12-28 LAB — HAPTOGLOBIN: Haptoglobin: 226 mg/dL (ref 38–329)

## 2023-01-01 DIAGNOSIS — K219 Gastro-esophageal reflux disease without esophagitis: Secondary | ICD-10-CM | POA: Diagnosis not present

## 2023-01-01 DIAGNOSIS — R051 Acute cough: Secondary | ICD-10-CM | POA: Diagnosis not present

## 2023-01-01 NOTE — Telephone Encounter (Signed)
Called to check status of application. Informed the Income Re-Determination was still under review. Requested that be expedited if possible. I will continue to follow and update until final determination.   [name], [facility] Patient [place]  [phone] (phone) [phone] (fax) 01/01/2023 8:33 AM

## 2023-01-15 NOTE — Telephone Encounter (Signed)
Called to check status of Income Re-Determination for re-enrollment to [employer]. Informed by representative that that was still in processing and that they were unable to give me an ETA for completion. Informed me that patient was eligible to receive up to 3 "gratis fills" of a 1 month supply of Promacta while that is still under review.   I called and informed [name], patient's spouse, of this. She knows to call  [phone] to request gratis fill while we are still waiting and knows to call me at [phone] if there are any issues.   I will continue to follow and update until final determination.    [name], [facility] Patient [place]  [phone] (phone) [phone] (fax) 01/15/2023 3:09 PM

## 2023-01-16 NOTE — Telephone Encounter (Signed)
Called [employer] today, with patient conferenced in, to request a gratis fill of Promacta while re-determination is processing. Representative set order up to ship out today, 01/16/23, with an expected delivery date of Thursday, 01/18/23.   I will continue to follow and update until final determination.    [name], [facility] Patient [place]  [phone] (phone) [phone] (fax) 01/16/2023 8:18 AM

## 2023-01-17 ENCOUNTER — Other Ambulatory Visit (HOSPITAL_COMMUNITY): Payer: Self-pay

## 2023-01-17 ENCOUNTER — Telehealth: Payer: Self-pay

## 2023-01-17 ENCOUNTER — Encounter: Payer: Self-pay | Admitting: Internal Medicine

## 2023-01-17 NOTE — Telephone Encounter (Signed)
[employer] and [employer] are having technical communicating with each other. Called [employer] at [phone] and requested a faxed Prior Authorization Form.   Prior Authorization for Promacta manually faxed to [employer] at [phone] on 01/17/2023.  Status is pending.   [name], [facility] Patient [place]  [phone] (phone) [phone] (fax) 01/17/2023 1:43 PM

## 2023-01-17 NOTE — Telephone Encounter (Signed)
Oral Oncology Patient Advocate Encounter  Re-authorization   Received notification from [employer] Patient [employer] that prior authorization for Promacta is due for renewal.   PA submitted on 01/17/2023  Key [id number]  Status is pending     [name], [place] Patient [place]  [phone] (phone) [phone] (fax) 01/17/2023 8:10 AM

## 2023-01-18 ENCOUNTER — Other Ambulatory Visit (HOSPITAL_COMMUNITY): Payer: Self-pay

## 2023-01-18 NOTE — Telephone Encounter (Addendum)
Oral Oncology Patient Advocate Encounter  Prior Authorization for [name] has been approved.    PA# [id number]  Effective dates: 01/17/2023 through 01/18/2024  Patients co-pay is $1,509.88.   Approval letter faxed to [employer] at [name], [name] Patient [place]  [phone] (phone) [phone] (fax) 01/18/2023 8:19 AM

## 2023-01-23 ENCOUNTER — Inpatient Hospital Stay: Payer: PPO | Attending: Internal Medicine

## 2023-01-23 DIAGNOSIS — D693 Immune thrombocytopenic purpura: Secondary | ICD-10-CM | POA: Diagnosis not present

## 2023-01-23 LAB — CBC WITH DIFFERENTIAL/PLATELET
Abs Immature Granulocytes: 0.02 10*3/uL (ref 0.00–0.07)
Basophils Absolute: 0 10*3/uL (ref 0.0–0.1)
Basophils Relative: 0 %
Eosinophils Absolute: 0.2 10*3/uL (ref 0.0–0.5)
Eosinophils Relative: 3 %
HCT: 38.3 % — ABNORMAL LOW (ref 39.0–52.0)
Hemoglobin: 12.8 g/dL — ABNORMAL LOW (ref 13.0–17.0)
Immature Granulocytes: 0 %
Lymphocytes Relative: 17 %
Lymphs Abs: 1.3 10*3/uL (ref 0.7–4.0)
MCH: 34 pg (ref 26.0–34.0)
MCHC: 33.4 g/dL (ref 30.0–36.0)
MCV: 101.6 fL — ABNORMAL HIGH (ref 80.0–100.0)
Monocytes Absolute: 0.8 10*3/uL (ref 0.1–1.0)
Monocytes Relative: 11 %
Neutro Abs: 5.2 10*3/uL (ref 1.7–7.7)
Neutrophils Relative %: 69 %
Platelets: 339 10*3/uL (ref 150–400)
RBC: 3.77 MIL/uL — ABNORMAL LOW (ref 4.22–5.81)
RDW: 13.3 % (ref 11.5–15.5)
WBC: 7.6 10*3/uL (ref 4.0–10.5)
nRBC: 0 % (ref 0.0–0.2)

## 2023-01-23 NOTE — Telephone Encounter (Signed)
Received notification that income re-determination was denied. Advised by representative to have patient send in 2023 tax return since that will not have the property sale on it. I called the patient and spoke to his spouse, [name]. She is agreeable to bring me a copy as soon as they have all their taxes prepared (she indicated it would be later this week or next week (w/e 03/08 or 03/15). I will submit once in hand.  I will continue to follow and update until final determination.    [name], [facility] Patient [place]  [phone] (phone) [phone] (fax) 01/23/2023 8:32 AM

## 2023-02-07 ENCOUNTER — Other Ambulatory Visit (HOSPITAL_COMMUNITY): Payer: Self-pay

## 2023-02-14 ENCOUNTER — Other Ambulatory Visit (HOSPITAL_COMMUNITY): Payer: Self-pay

## 2023-02-20 ENCOUNTER — Inpatient Hospital Stay: Payer: PPO | Attending: Internal Medicine

## 2023-02-20 DIAGNOSIS — D693 Immune thrombocytopenic purpura: Secondary | ICD-10-CM | POA: Insufficient documentation

## 2023-02-20 LAB — CBC WITH DIFFERENTIAL/PLATELET
Abs Immature Granulocytes: 0.02 10*3/uL (ref 0.00–0.07)
Basophils Absolute: 0 10*3/uL (ref 0.0–0.1)
Basophils Relative: 1 %
Eosinophils Absolute: 0.3 10*3/uL (ref 0.0–0.5)
Eosinophils Relative: 4 %
HCT: 39.2 % (ref 39.0–52.0)
Hemoglobin: 13.4 g/dL (ref 13.0–17.0)
Immature Granulocytes: 0 %
Lymphocytes Relative: 19 %
Lymphs Abs: 1.3 10*3/uL (ref 0.7–4.0)
MCH: 34.7 pg — ABNORMAL HIGH (ref 26.0–34.0)
MCHC: 34.2 g/dL (ref 30.0–36.0)
MCV: 101.6 fL — ABNORMAL HIGH (ref 80.0–100.0)
Monocytes Absolute: 0.7 10*3/uL (ref 0.1–1.0)
Monocytes Relative: 10 %
Neutro Abs: 4.8 10*3/uL (ref 1.7–7.7)
Neutrophils Relative %: 66 %
Platelets: 293 10*3/uL (ref 150–400)
RBC: 3.86 MIL/uL — ABNORMAL LOW (ref 4.22–5.81)
RDW: 13.3 % (ref 11.5–15.5)
WBC: 7.2 10*3/uL (ref 4.0–10.5)
nRBC: 0 % (ref 0.0–0.2)

## 2023-02-27 ENCOUNTER — Other Ambulatory Visit: Payer: Self-pay | Admitting: Internal Medicine

## 2023-02-27 DIAGNOSIS — D693 Immune thrombocytopenic purpura: Secondary | ICD-10-CM

## 2023-02-27 NOTE — Telephone Encounter (Signed)
CBC with Differential/Platelet Order: [id number] Status: Final result     Visible to patient: Yes (seen)     Next appt: 03/20/2023 at 02:00 PM in Oncology ([facility])     Dx: Idiopathic thrombocytopenic purpura   0 Result Notes          Component Ref Range & Units 7 d ago 1 mo ago 2 mo ago 3 mo ago 4 mo ago 6 mo ago 7 mo ago  WBC 4.0 - 10.5 K/uL 7.2 7.6 9.0 7.8 9.6 9.3 8.5  RBC 4.22 - 5.81 MIL/uL 3.86 Low  3.77 Low  4.05 Low  3.97 Low  4.03 Low  4.07 Low  4.14 Low   Hemoglobin 13.0 - 17.0 g/dL 58.0 99.8 Low  33.8 25.0 13.5 14.0 14.2  HCT 39.0 - 52.0 % 39.2 38.3 Low  41.1 39.4 40.1 40.2 41.5  MCV 80.0 - 100.0 fL 101.6 High  101.6 High  101.5 High  99.2 99.5 98.8 100.2 High   MCH 26.0 - 34.0 pg 34.7 High  34.0 34.1 High  34.5 High  33.5 34.4 High  34.3 High   MCHC 30.0 - 36.0 g/dL 53.9 76.7 34.1 93.7 90.2 34.8 34.2  RDW 11.5 - 15.5 % 13.3 13.3 13.6 13.7 13.2 12.9 13.0  Platelets 150 - 400 K/uL 293 339 427 High  238 460 High  373 409 High   nRBC 0.0 - 0.2 % 0.0 0.0 0.0 0.0 0.0 0.0 0.0  Neutrophils Relative % % 66 69 74 75 76 79 70  Neutro Abs 1.7 - 7.7 K/uL 4.8 5.2 6.7 5.9 7.3 7.2 6.0  Lymphocytes Relative % 19 17 16 16 15 13 18   Lymphs Abs 0.7 - 4.0 K/uL 1.3 1.3 1.4 1.2 1.4 1.2 1.5  Monocytes Relative % 10 11 7 8 8 7 8   Monocytes Absolute 0.1 - 1.0 K/uL 0.7 0.8 0.6 0.6 0.7 0.7 0.7  Eosinophils Relative % 4 3 2 1 1 1 3   Eosinophils Absolute 0.0 - 0.5 K/uL 0.3 0.2 0.2 0.1 0.1 0.1 0.2  Basophils Relative % 1 0 1 0 0 0 1  Basophils Absolute 0.0 - 0.1 K/uL 0.0 0.0 0.1 0.0 0.0 0.0 0.0  Immature Granulocytes % 0 0 0 0 0 0 0  Abs Immature Granulocytes 0.00 - 0.07 K/uL 0.02 0.02 CM 0.03 CM 0.02 CM 0.03 CM 0.03 CM 0.02 CM  Comment: Performed at [facility], [address] [place], [place] [address]  Resulting Agency [facility]         Specimen Collected: 02/20/23 14:02 Last Resulted:  02/20/23 14:25

## 2023-03-19 ENCOUNTER — Other Ambulatory Visit: Payer: Self-pay

## 2023-03-19 DIAGNOSIS — D693 Immune thrombocytopenic purpura: Secondary | ICD-10-CM

## 2023-03-20 ENCOUNTER — Other Ambulatory Visit (HOSPITAL_COMMUNITY): Payer: Self-pay

## 2023-03-20 ENCOUNTER — Inpatient Hospital Stay: Payer: PPO

## 2023-03-20 DIAGNOSIS — D693 Immune thrombocytopenic purpura: Secondary | ICD-10-CM | POA: Diagnosis not present

## 2023-03-20 LAB — CBC WITH DIFFERENTIAL (CANCER CENTER ONLY)
Abs Immature Granulocytes: 0.03 10*3/uL (ref 0.00–0.07)
Basophils Absolute: 0 10*3/uL (ref 0.0–0.1)
Basophils Relative: 1 %
Eosinophils Absolute: 0.3 10*3/uL (ref 0.0–0.5)
Eosinophils Relative: 3 %
HCT: 38.9 % — ABNORMAL LOW (ref 39.0–52.0)
Hemoglobin: 13.2 g/dL (ref 13.0–17.0)
Immature Granulocytes: 0 %
Lymphocytes Relative: 18 %
Lymphs Abs: 1.4 10*3/uL (ref 0.7–4.0)
MCH: 34 pg (ref 26.0–34.0)
MCHC: 33.9 g/dL (ref 30.0–36.0)
MCV: 100.3 fL — ABNORMAL HIGH (ref 80.0–100.0)
Monocytes Absolute: 0.8 10*3/uL (ref 0.1–1.0)
Monocytes Relative: 10 %
Neutro Abs: 5.3 10*3/uL (ref 1.7–7.7)
Neutrophils Relative %: 68 %
Platelet Count: 315 10*3/uL (ref 150–400)
RBC: 3.88 MIL/uL — ABNORMAL LOW (ref 4.22–5.81)
RDW: 13.6 % (ref 11.5–15.5)
WBC Count: 7.9 10*3/uL (ref 4.0–10.5)
nRBC: 0 % (ref 0.0–0.2)

## 2023-03-20 NOTE — Telephone Encounter (Signed)
Patient dropped off tax forms. Denial Reconsideration Form faxed, along with 2023 1040 Tax Form, to [phone]. I will continue to follow and update until final determination.    [name], CPhT Oncology Pharmacy Patient Advocate  [facility]  [phone] (phone) [phone] (fax) 03/20/2023 2:30 PM

## 2023-03-20 NOTE — Telephone Encounter (Signed)
Called to check status of getting tax forms to office. Patient will drop off forms today, 03/20/23 at MD office.    [name], CPhT Oncology Pharmacy Patient Advocate  [facility]  [phone] (phone) [phone] (fax) 03/20/2023 8:27 AM

## 2023-03-22 DIAGNOSIS — L570 Actinic keratosis: Secondary | ICD-10-CM | POA: Diagnosis not present

## 2023-03-22 DIAGNOSIS — L298 Other pruritus: Secondary | ICD-10-CM | POA: Diagnosis not present

## 2023-03-22 DIAGNOSIS — L820 Inflamed seborrheic keratosis: Secondary | ICD-10-CM | POA: Diagnosis not present

## 2023-03-22 DIAGNOSIS — D2261 Melanocytic nevi of right upper limb, including shoulder: Secondary | ICD-10-CM | POA: Diagnosis not present

## 2023-03-22 DIAGNOSIS — D2262 Melanocytic nevi of left upper limb, including shoulder: Secondary | ICD-10-CM | POA: Diagnosis not present

## 2023-03-22 DIAGNOSIS — D2272 Melanocytic nevi of left lower limb, including hip: Secondary | ICD-10-CM | POA: Diagnosis not present

## 2023-03-22 DIAGNOSIS — L538 Other specified erythematous conditions: Secondary | ICD-10-CM | POA: Diagnosis not present

## 2023-03-22 DIAGNOSIS — Z85828 Personal history of other malignant neoplasm of skin: Secondary | ICD-10-CM | POA: Diagnosis not present

## 2023-03-22 DIAGNOSIS — Z85820 Personal history of malignant melanoma of skin: Secondary | ICD-10-CM | POA: Diagnosis not present

## 2023-03-22 NOTE — Telephone Encounter (Signed)
Oral Oncology Patient Advocate Encounter   Received notification re-enrollment for assistance for Promacta through [employer] has been approved. Patient may continue to receive their medication at $0 from this program.    [employer]PA[employer]'s phone number [phone].   Effective dates: 03/21/23 through 11/20/23  I have spoken to the patient.   [name], CPhT Oncology Pharmacy Patient Advocate  [facility]  [phone] (phone) [phone] (fax) 03/22/2023 8:12 AM

## 2023-03-29 DIAGNOSIS — H40051 Ocular hypertension, right eye: Secondary | ICD-10-CM | POA: Diagnosis not present

## 2023-04-12 DIAGNOSIS — R55 Syncope and collapse: Secondary | ICD-10-CM | POA: Diagnosis not present

## 2023-04-12 DIAGNOSIS — I10 Essential (primary) hypertension: Secondary | ICD-10-CM | POA: Diagnosis not present

## 2023-04-12 DIAGNOSIS — E7800 Pure hypercholesterolemia, unspecified: Secondary | ICD-10-CM | POA: Diagnosis not present

## 2023-04-17 ENCOUNTER — Other Ambulatory Visit: Payer: PPO

## 2023-04-17 ENCOUNTER — Inpatient Hospital Stay: Payer: PPO | Attending: Internal Medicine

## 2023-04-25 ENCOUNTER — Encounter: Payer: Self-pay | Admitting: Internal Medicine

## 2023-04-25 ENCOUNTER — Inpatient Hospital Stay: Payer: PPO | Attending: Internal Medicine | Admitting: Internal Medicine

## 2023-04-25 ENCOUNTER — Inpatient Hospital Stay: Payer: PPO

## 2023-04-25 VITALS — BP 126/79 | HR 78 | Temp 97.7°F | Ht 67.0 in | Wt 177.8 lb

## 2023-04-25 DIAGNOSIS — G4700 Insomnia, unspecified: Secondary | ICD-10-CM | POA: Diagnosis not present

## 2023-04-25 DIAGNOSIS — Z8546 Personal history of malignant neoplasm of prostate: Secondary | ICD-10-CM | POA: Insufficient documentation

## 2023-04-25 DIAGNOSIS — D591 Autoimmune hemolytic anemia, unspecified: Secondary | ICD-10-CM | POA: Insufficient documentation

## 2023-04-25 DIAGNOSIS — D693 Immune thrombocytopenic purpura: Secondary | ICD-10-CM | POA: Diagnosis not present

## 2023-04-25 LAB — CBC WITH DIFFERENTIAL/PLATELET
Abs Immature Granulocytes: 0.03 10*3/uL (ref 0.00–0.07)
Basophils Absolute: 0 10*3/uL (ref 0.0–0.1)
Basophils Relative: 0 %
Eosinophils Absolute: 0.1 10*3/uL (ref 0.0–0.5)
Eosinophils Relative: 2 %
HCT: 39.1 % (ref 39.0–52.0)
Hemoglobin: 13.4 g/dL (ref 13.0–17.0)
Immature Granulocytes: 0 %
Lymphocytes Relative: 18 %
Lymphs Abs: 1.3 10*3/uL (ref 0.7–4.0)
MCH: 34.4 pg — ABNORMAL HIGH (ref 26.0–34.0)
MCHC: 34.3 g/dL (ref 30.0–36.0)
MCV: 100.5 fL — ABNORMAL HIGH (ref 80.0–100.0)
Monocytes Absolute: 0.7 10*3/uL (ref 0.1–1.0)
Monocytes Relative: 9 %
Neutro Abs: 5.4 10*3/uL (ref 1.7–7.7)
Neutrophils Relative %: 71 %
Platelets: 293 10*3/uL (ref 150–400)
RBC: 3.89 MIL/uL — ABNORMAL LOW (ref 4.22–5.81)
RDW: 13.2 % (ref 11.5–15.5)
WBC: 7.6 10*3/uL (ref 4.0–10.5)
nRBC: 0 % (ref 0.0–0.2)

## 2023-04-25 LAB — COMPREHENSIVE METABOLIC PANEL
ALT: 14 U/L (ref 0–44)
AST: 19 U/L (ref 15–41)
Albumin: 3.7 g/dL (ref 3.5–5.0)
Alkaline Phosphatase: 63 U/L (ref 38–126)
Anion gap: 7 (ref 5–15)
BUN: 16 mg/dL (ref 8–23)
CO2: 26 mmol/L (ref 22–32)
Calcium: 8.6 mg/dL — ABNORMAL LOW (ref 8.9–10.3)
Chloride: 101 mmol/L (ref 98–111)
Creatinine, Ser: 0.94 mg/dL (ref 0.61–1.24)
GFR, Estimated: >60 mL/min (ref >60)
Glucose, Bld: 84 mg/dL (ref 70–99)
Potassium: 3.6 mmol/L (ref 3.5–5.1)
Sodium: 134 mmol/L — ABNORMAL LOW (ref 135–145)
Total Bilirubin: 0.5 mg/dL (ref 0.3–1.2)
Total Protein: 6.6 g/dL (ref 6.5–8.1)

## 2023-04-25 LAB — LACTATE DEHYDROGENASE: LDH: 145 U/L (ref 98–192)

## 2023-04-25 NOTE — Progress Notes (Signed)
[facility] OFFICE PROGRESS NOTE  Patient Care Team: Kandyce Rud, MD as PCP - General (Family Medicine) Earna Coder, MD as Consulting Physician (Oncology)   SUMMARY OF ONCOLOGIC HISTORY:  #FEB 2016-  ITP  s/p Prednisone; OCT 2016- Relapse of ITP- Prednisone; JAN 13th- START RITUXAN q W x4 [finished Feb 8th]; March 10th 2017- N-plate q W- good response; Dec 2018- promacta 25 mg qOD.   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   # Hx of nose bleeds  INTERVAL HISTORY: Walking independently.  Accompanied by his wife.  A very pleasant 89 year old male patient with a history of autoimmune hemolytic anemia/ITP is here for follow-up.    C/o not being able to sleep. Patient currently taking 1 promacta twice a week. No side effects from promacta.  NO falls. appetite is good. Denies any pain.    No bleeding. Continues to play golf.  No worsening fatigue.  Complains of joint pains.  Chronic.  Not any worse.  Review of Systems  Constitutional:  Negative for chills, diaphoresis, fever, malaise/fatigue and weight loss.  HENT:  Negative for nosebleeds and sore throat.   Eyes:  Negative for double vision.  Respiratory:  Negative for cough, hemoptysis, sputum production, shortness of breath and wheezing.   Cardiovascular:  Negative for chest pain, palpitations, orthopnea and leg swelling.  Gastrointestinal:  Negative for abdominal pain, blood in stool, constipation, diarrhea, heartburn, melena, nausea and vomiting.  Genitourinary:  Negative for dysuria, frequency and urgency.  Musculoskeletal:  Positive for back pain and joint pain.  Skin: Negative.  Negative for itching and rash.  Neurological:  Negative for dizziness, tingling, focal weakness, weakness and headaches.  Endo/Heme/Allergies:  Does not bruise/bleed easily.  Psychiatric/Behavioral:  Negative for depression. The patient is not nervous/anxious  and does not have insomnia.     PAST MEDICAL HISTORY :  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   Leukocytosis    Prostate cancer (HCC)    Shingles     PAST SURGICAL HISTORY :   Past Surgical History:  Procedure Laterality Date   artificial left eye     EYE SURGERY     HERNIA REPAIR     JOINT REPLACEMENT     PROSTATE SURGERY      FAMILY HISTORY :   Family History  Problem Relation Age of Onset   Cancer Mother    Multiple sclerosis Father     SOCIAL HISTORY:   Social History   Tobacco Use   Smoking status: Never   Smokeless tobacco: Never  Vaping Use   Vaping Use: Never used  Substance Use Topics   Alcohol use: Yes   Drug use: No    ALLERGIES:  is allergic to cephalexin and hydrocodone-acetaminophen.  MEDICATIONS:  Current Outpatient Medications  Medication Sig Dispense Refill   acetaminophen (TYLENOL) 500 MG tablet Take 500 mg by mouth every 6 (six) hours as needed for mild pain. Reported on 01/24/2016     Cholecalciferol (VITAMIN D3) 250 MCG (10000 UT) capsule Take 10,000 Units by mouth daily.     citalopram (CELEXA) 10 MG tablet Take 1 tablet by mouth daily.     eltrombopag (PROMACTA) 25 MG tablet TAKE 1 TABLET (25 MG TOTAL) BY  MOUTH EVERY OTHER DAY. TAKE ON AN EMPTY STOMACH, 1 HOUR BEFORE A MEAL OR 2 HOURS AFTER. 30 tablet 4   latanoprost (XALATAN) 0.005 % ophthalmic solution Place 1 drop into the right eye at bedtime.     lisinopril (PRINIVIL,ZESTRIL) 5 MG tablet Take 10 mg by mouth daily.      meclizine (ANTIVERT) 25 MG tablet Take 1 tablet by mouth as needed for dizziness.     mycophenolate (CELLCEPT) 500 MG tablet TAKE 2 TABLETS BY MOUTH EVERY MORNING 60 tablet 3   omeprazole (PRILOSEC) 40 MG capsule TAKE 1 CAPSULE BY MOUTH DAILY USUALLY 30 MINUTES BEFORE BREAKFAST 90 capsule 2    polyethylene glycol (MIRALAX / GLYCOLAX) packet Take 17 g by mouth daily as needed for mild constipation. 14 each 0   pravastatin (PRAVACHOL) 20 MG tablet Take 20 mg by mouth at bedtime.      No current facility-administered medications for this visit.    PHYSICAL EXAMINATION:   BP 126/79 (BP Location: Left Arm, Patient Position: Sitting, Cuff Size: Normal)   Pulse 78   Temp 97.7 F (36.5 C) (Tympanic)   Ht 5\' 7"  (1.702 m)   Wt 177 lb 12.8 oz (80.6 kg)   SpO2 96%   BMI 27.85 kg/m   Filed Weights   04/25/23 1435  Weight: 177 lb 12.8 oz (80.6 kg)     Physical Exam HENT:     Head: Normocephalic and atraumatic.     Mouth/Throat:     Pharynx: No oropharyngeal exudate.  Eyes:     Pupils: Pupils are equal, round, and reactive to light.  Cardiovascular:     Rate and Rhythm: Normal rate and regular rhythm.  Pulmonary:     Effort: No respiratory distress.     Breath sounds: No wheezing.  Abdominal:     General: Bowel sounds are normal. There is no distension.     Palpations: Abdomen is soft. There is no mass.     Tenderness: There is no abdominal tenderness. There is no guarding or rebound.  Musculoskeletal:        General: No tenderness. Normal range of motion.     Cervical back: Normal range of motion and neck supple.  Skin:    General: Skin is warm.  Neurological:     Mental Status: He is alert and oriented to person, place, and time.  Psychiatric:        Mood and Affect: Affect normal.      LABORATORY DATA:  I have reviewed the data as listed    Component Value Date/Time   NA 134 (L) 04/25/2023 1440   NA 131 (L) 03/17/2015 1425   K 3.6 04/25/2023 1440   K 3.9 03/17/2015 1425   CL 101 04/25/2023 1440   CL 101 03/17/2015 1425   CO2 26 04/25/2023 1440   CO2 26 03/17/2015 1425   GLUCOSE 84 04/25/2023 1440   GLUCOSE 161 (H) 03/17/2015 1425   BUN 16 04/25/2023 1440   BUN 17 03/17/2015 1425   CREATININE 0.94 04/25/2023 1440   CREATININE 0.90 03/17/2015 1425    CALCIUM 8.6 (L) 04/25/2023 1440   CALCIUM 8.5 (L) 03/17/2015 1425   PROT 6.6 04/25/2023 1440   PROT 6.7 03/17/2015 1425   ALBUMIN 3.7 04/25/2023 1440   ALBUMIN 3.7 03/17/2015 1425   AST 19 04/25/2023 1440   AST 25 03/17/2015 1425   ALT 14 04/25/2023 1440   ALT 23 03/17/2015 1425   ALKPHOS 63 04/25/2023 1440  ALKPHOS 44 03/17/2015 1425   BILITOT 0.5 04/25/2023 1440   BILITOT 0.6 03/17/2015 1425   GFRNONAA >60 04/25/2023 1440   GFRNONAA >60 03/17/2015 1425   GFRAA >60 08/02/2020 1322   GFRAA >60 03/17/2015 1425    No results found for: "SPEP", "UPEP"  Lab Results  Component Value Date   WBC 7.6 04/25/2023   NEUTROABS 5.4 04/25/2023   HGB 13.4 04/25/2023   HCT 39.1 04/25/2023   MCV 100.5 (H) 04/25/2023   PLT 293 04/25/2023      Chemistry      Component Value Date/Time   NA 134 (L) 04/25/2023 1440   NA 131 (L) 03/17/2015 1425   K 3.6 04/25/2023 1440   K 3.9 03/17/2015 1425   CL 101 04/25/2023 1440   CL 101 03/17/2015 1425   CO2 26 04/25/2023 1440   CO2 26 03/17/2015 1425   BUN 16 04/25/2023 1440   BUN 17 03/17/2015 1425   CREATININE 0.94 04/25/2023 1440   CREATININE 0.90 03/17/2015 1425      Component Value Date/Time   CALCIUM 8.6 (L) 04/25/2023 1440   CALCIUM 8.5 (L) 03/17/2015 1425   ALKPHOS 63 04/25/2023 1440   ALKPHOS 44 03/17/2015 1425   AST 19 04/25/2023 1440   AST 25 03/17/2015 1425   ALT 14 04/25/2023 1440   ALT 23 03/17/2015 1425   BILITOT 0.5 04/25/2023 1440   BILITOT 0.6 03/17/2015 1425       ASSESSMENT & PLAN:   Idiopathic thrombocytopenic purpura (HCC) #Chronic ITP currently on  Promacta 25 mg every other day. Currently  Platelets 292; On promacta Monday/Thursdays given insurance issues. Continue cellcept 500 mg a AM.   # Hemolytic anemia hemoglobin -14 - LDH normal. Continue CellCept 500 milligrams a AM- stable.   # Hypocalcemia- 8.8; continue ca+vit D once a day. Stable.   # Hx of falls- ? Etiology. S/p evaluation with Maureen-  stable.   # Insomnia: Hold off any medications given risk of falls.   # DISPOSITION: # labs q 4 weeks- cbc;  # follow up in 4 months/labs-cbc/cmp/LDH; haptoglobin- Dr.B      Earna Coder, MD 04/25/2023 3:48 PM

## 2023-04-25 NOTE — Assessment & Plan Note (Addendum)
#  Chronic ITP currently on  Promacta 25 mg every other day. Currently  Platelets 292; On promacta Monday/Thursdays given insurance issues. Continue cellcept 500 mg a AM.   # Hemolytic anemia hemoglobin -14 - LDH normal. Continue CellCept 500 milligrams a AM- stable.   # Hypocalcemia- 8.8; continue ca+vit D once a day. Stable.   # Hx of falls- ? Etiology. S/p evaluation with Maureen- stable.   # Insomnia: Hold off any medications given risk of falls.   # DISPOSITION: # labs q 4 weeks- cbc;  # follow up in 4 months/labs-cbc/cmp/LDH; haptoglobin- Dr.B

## 2023-04-25 NOTE — Progress Notes (Signed)
C/o not being able to sleep. Is there something he can take?

## 2023-04-27 DIAGNOSIS — Z79899 Other long term (current) drug therapy: Secondary | ICD-10-CM | POA: Diagnosis not present

## 2023-04-27 LAB — HAPTOGLOBIN: Haptoglobin: 191 mg/dL (ref 38–329)

## 2023-05-01 DIAGNOSIS — L570 Actinic keratosis: Secondary | ICD-10-CM | POA: Diagnosis not present

## 2023-05-01 DIAGNOSIS — L820 Inflamed seborrheic keratosis: Secondary | ICD-10-CM | POA: Diagnosis not present

## 2023-05-01 DIAGNOSIS — L988 Other specified disorders of the skin and subcutaneous tissue: Secondary | ICD-10-CM | POA: Diagnosis not present

## 2023-05-01 DIAGNOSIS — L538 Other specified erythematous conditions: Secondary | ICD-10-CM | POA: Diagnosis not present

## 2023-05-01 DIAGNOSIS — W458XXA Other foreign body or object entering through skin, initial encounter: Secondary | ICD-10-CM | POA: Diagnosis not present

## 2023-05-01 DIAGNOSIS — R208 Other disturbances of skin sensation: Secondary | ICD-10-CM | POA: Diagnosis not present

## 2023-05-01 DIAGNOSIS — D485 Neoplasm of uncertain behavior of skin: Secondary | ICD-10-CM | POA: Diagnosis not present

## 2023-05-04 DIAGNOSIS — Z79899 Other long term (current) drug therapy: Secondary | ICD-10-CM | POA: Diagnosis not present

## 2023-05-04 DIAGNOSIS — I10 Essential (primary) hypertension: Secondary | ICD-10-CM | POA: Diagnosis not present

## 2023-05-04 DIAGNOSIS — Z125 Encounter for screening for malignant neoplasm of prostate: Secondary | ICD-10-CM | POA: Diagnosis not present

## 2023-05-04 DIAGNOSIS — R972 Elevated prostate specific antigen [PSA]: Secondary | ICD-10-CM | POA: Diagnosis not present

## 2023-05-04 DIAGNOSIS — E7800 Pure hypercholesterolemia, unspecified: Secondary | ICD-10-CM | POA: Diagnosis not present

## 2023-05-04 DIAGNOSIS — C61 Malignant neoplasm of prostate: Secondary | ICD-10-CM | POA: Diagnosis not present

## 2023-05-04 DIAGNOSIS — D693 Immune thrombocytopenic purpura: Secondary | ICD-10-CM | POA: Diagnosis not present

## 2023-05-22 ENCOUNTER — Inpatient Hospital Stay: Payer: PPO | Attending: Internal Medicine

## 2023-05-22 DIAGNOSIS — D693 Immune thrombocytopenic purpura: Secondary | ICD-10-CM | POA: Insufficient documentation

## 2023-05-22 LAB — CBC WITH DIFFERENTIAL/PLATELET
Abs Immature Granulocytes: 0.04 10*3/uL (ref 0.00–0.07)
Basophils Absolute: 0 10*3/uL (ref 0.0–0.1)
Basophils Relative: 1 %
Eosinophils Absolute: 0.1 10*3/uL (ref 0.0–0.5)
Eosinophils Relative: 2 %
HCT: 40.9 % (ref 39.0–52.0)
Hemoglobin: 13.8 g/dL (ref 13.0–17.0)
Immature Granulocytes: 1 %
Lymphocytes Relative: 18 %
Lymphs Abs: 1.5 10*3/uL (ref 0.7–4.0)
MCH: 33.7 pg (ref 26.0–34.0)
MCHC: 33.7 g/dL (ref 30.0–36.0)
MCV: 100 fL (ref 80.0–100.0)
Monocytes Absolute: 0.7 10*3/uL (ref 0.1–1.0)
Monocytes Relative: 8 %
Neutro Abs: 6.2 10*3/uL (ref 1.7–7.7)
Neutrophils Relative %: 70 %
Platelets: 332 10*3/uL (ref 150–400)
RBC: 4.09 MIL/uL — ABNORMAL LOW (ref 4.22–5.81)
RDW: 12.9 % (ref 11.5–15.5)
WBC: 8.5 10*3/uL (ref 4.0–10.5)
nRBC: 0 % (ref 0.0–0.2)

## 2023-06-05 DIAGNOSIS — Z8546 Personal history of malignant neoplasm of prostate: Secondary | ICD-10-CM | POA: Diagnosis not present

## 2023-06-05 DIAGNOSIS — Z08 Encounter for follow-up examination after completed treatment for malignant neoplasm: Secondary | ICD-10-CM | POA: Diagnosis not present

## 2023-06-05 DIAGNOSIS — C61 Malignant neoplasm of prostate: Secondary | ICD-10-CM | POA: Diagnosis not present

## 2023-06-05 DIAGNOSIS — R399 Unspecified symptoms and signs involving the genitourinary system: Secondary | ICD-10-CM | POA: Diagnosis not present

## 2023-06-18 ENCOUNTER — Other Ambulatory Visit: Payer: Self-pay | Admitting: *Deleted

## 2023-06-18 DIAGNOSIS — D693 Immune thrombocytopenic purpura: Secondary | ICD-10-CM

## 2023-06-19 ENCOUNTER — Emergency Department
Admission: EM | Admit: 2023-06-19 | Discharge: 2023-06-19 | Disposition: A | Discharge status: Home / Self Care | Payer: PPO | Attending: Student in an Organized Health Care Education/Training Program | Admitting: Student in an Organized Health Care Education/Training Program

## 2023-06-19 ENCOUNTER — Inpatient Hospital Stay: Payer: PPO

## 2023-06-19 ENCOUNTER — Encounter: Payer: Self-pay | Admitting: Emergency Medicine

## 2023-06-19 ENCOUNTER — Emergency Department: Payer: PPO

## 2023-06-19 DIAGNOSIS — W19XXXA Unspecified fall, initial encounter: Secondary | ICD-10-CM

## 2023-06-19 DIAGNOSIS — S80212A Abrasion, left knee, initial encounter: Secondary | ICD-10-CM | POA: Diagnosis not present

## 2023-06-19 DIAGNOSIS — I10 Essential (primary) hypertension: Secondary | ICD-10-CM | POA: Diagnosis not present

## 2023-06-19 DIAGNOSIS — S80211A Abrasion, right knee, initial encounter: Secondary | ICD-10-CM | POA: Insufficient documentation

## 2023-06-19 DIAGNOSIS — R9089 Other abnormal findings on diagnostic imaging of central nervous system: Secondary | ICD-10-CM | POA: Diagnosis not present

## 2023-06-19 DIAGNOSIS — Z043 Encounter for examination and observation following other accident: Secondary | ICD-10-CM | POA: Diagnosis not present

## 2023-06-19 DIAGNOSIS — S40811A Abrasion of right upper arm, initial encounter: Secondary | ICD-10-CM | POA: Insufficient documentation

## 2023-06-19 DIAGNOSIS — M25562 Pain in left knee: Secondary | ICD-10-CM | POA: Diagnosis not present

## 2023-06-19 DIAGNOSIS — S199XXA Unspecified injury of neck, initial encounter: Secondary | ICD-10-CM | POA: Diagnosis not present

## 2023-06-19 DIAGNOSIS — S00411A Abrasion of right ear, initial encounter: Secondary | ICD-10-CM | POA: Insufficient documentation

## 2023-06-19 DIAGNOSIS — D693 Immune thrombocytopenic purpura: Secondary | ICD-10-CM | POA: Diagnosis not present

## 2023-06-19 DIAGNOSIS — Z96651 Presence of right artificial knee joint: Secondary | ICD-10-CM | POA: Diagnosis not present

## 2023-06-19 DIAGNOSIS — Y92096 Garden or yard of other non-institutional residence as the place of occurrence of the external cause: Secondary | ICD-10-CM | POA: Insufficient documentation

## 2023-06-19 DIAGNOSIS — S0990XA Unspecified injury of head, initial encounter: Secondary | ICD-10-CM | POA: Diagnosis not present

## 2023-06-19 DIAGNOSIS — M25561 Pain in right knee: Secondary | ICD-10-CM | POA: Diagnosis not present

## 2023-06-19 DIAGNOSIS — M25461 Effusion, right knee: Secondary | ICD-10-CM | POA: Diagnosis not present

## 2023-06-19 DIAGNOSIS — I6523 Occlusion and stenosis of bilateral carotid arteries: Secondary | ICD-10-CM | POA: Diagnosis not present

## 2023-06-19 DIAGNOSIS — M1712 Unilateral primary osteoarthritis, left knee: Secondary | ICD-10-CM | POA: Diagnosis not present

## 2023-06-19 DIAGNOSIS — W01198A Fall on same level from slipping, tripping and stumbling with subsequent striking against other object, initial encounter: Secondary | ICD-10-CM | POA: Diagnosis not present

## 2023-06-19 DIAGNOSIS — S40812A Abrasion of left upper arm, initial encounter: Secondary | ICD-10-CM | POA: Diagnosis not present

## 2023-06-19 DIAGNOSIS — M47812 Spondylosis without myelopathy or radiculopathy, cervical region: Secondary | ICD-10-CM | POA: Insufficient documentation

## 2023-06-19 DIAGNOSIS — M25462 Effusion, left knee: Secondary | ICD-10-CM | POA: Diagnosis not present

## 2023-06-19 LAB — CBC WITH DIFFERENTIAL (CANCER CENTER ONLY)
Abs Immature Granulocytes: 0.03 10*3/uL (ref 0.00–0.07)
Basophils Absolute: 0 10*3/uL (ref 0.0–0.1)
Basophils Relative: 0 %
Eosinophils Absolute: 0.1 10*3/uL (ref 0.0–0.5)
Eosinophils Relative: 2 %
HCT: 39.4 % (ref 39.0–52.0)
Hemoglobin: 13.2 g/dL (ref 13.0–17.0)
Immature Granulocytes: 0 %
Lymphocytes Relative: 13 %
Lymphs Abs: 1.2 10*3/uL (ref 0.7–4.0)
MCH: 33.8 pg (ref 26.0–34.0)
MCHC: 33.5 g/dL (ref 30.0–36.0)
MCV: 101 fL — ABNORMAL HIGH (ref 80.0–100.0)
Monocytes Absolute: 0.6 10*3/uL (ref 0.1–1.0)
Monocytes Relative: 7 %
Neutro Abs: 7.2 10*3/uL (ref 1.7–7.7)
Neutrophils Relative %: 78 %
Platelet Count: 321 10*3/uL (ref 150–400)
RBC: 3.9 MIL/uL — ABNORMAL LOW (ref 4.22–5.81)
RDW: 13.1 % (ref 11.5–15.5)
WBC Count: 9.3 10*3/uL (ref 4.0–10.5)
nRBC: 0 % (ref 0.0–0.2)

## 2023-06-19 NOTE — ED Triage Notes (Signed)
Pt with spouse who reports pt was picking up debris in yard and fell forward hitting bilateral knees, right upper arm and right ear/head. Pt and spouse deny LOC. Spouse reports pt has balance issues and refuses to stop doing task that she believes he should not be doing. Abrasions noted to bilateral knees, right upper arm with skin tear and abrasion to upper rt ear. Redness noted to right side of head above ear. Pt with chronic ITP with recent blood work normal levels. Pt denies blood thinner medication.

## 2023-06-19 NOTE — ED Provider Notes (Signed)
[facility] Provider Note  Patient Contact: 10:21 PM (approximate)   History   No chief complaint on file.   HPI  [name] is a 89 y.o. male with a history of shingles, dehydration, hypertension, hypercholesterolemia and ITP, presents to the emergency department after patient had a mechanical fall while picking up limbs outside.  Patient had some bilateral knee discomfort and he did hit his head.  Patient has a small abrasion to right ear.  Given patient's history of ITP, mom was concerned.  No chest pain, chest tightness or abdominal pain.  No numbness or tingling in the upper and lower extremities.      Physical Exam   Triage Vital Signs: ED Triage Vitals  Encounter Vitals Group     BP 06/19/23 2105 (!) 142/83     Systolic BP Percentile --      Diastolic BP Percentile --      Pulse Rate 06/19/23 2105 88     Resp 06/19/23 2105 16     Temp 06/19/23 2105 97.6 F (36.4 C)     Temp Source 06/19/23 2105 Oral     SpO2 06/19/23 2105 98 %     Weight --      Height 06/19/23 2106 5\' 7"  (1.702 m)     Head Circumference --      Peak Flow --      Pain Score 06/19/23 2105 0     Pain Loc --      Pain Education --      Exclude from Growth Chart --     Most recent vital signs: Vitals:   06/19/23 2105  BP: (!) 142/83  Pulse: 88  Resp: 16  Temp: 97.6 F (36.4 C)  SpO2: 98%     General: Alert and in no acute distress. Eyes:  PERRL. EOMI. Head: No acute traumatic findings ENT:      Ears: Patient has abrasion at right external ear.       Nose: No congestion/rhinnorhea.      Mouth/Throat: Mucous membranes are moist. Neck: No stridor. No cervical spine tenderness to palpation. Cardiovascular:  Good peripheral perfusion Respiratory: Normal respiratory effort without tachypnea or retractions. Lungs CTAB. Good air entry to the bases with no decreased or absent breath sounds. Gastrointestinal: Bowel sounds 4 quadrants. Soft and nontender to  palpation. No guarding or rigidity. No palpable masses. No distention. No CVA tenderness. Musculoskeletal: Full range of motion to all extremities.  Neurologic:  No gross focal neurologic deficits are appreciated.  Skin: Patient has superficial abrasions at upper and lower extremities.     ED Results / Procedures / Treatments   Labs (all labs ordered are listed, but only abnormal results are displayed) Labs Reviewed - No data to display     PROCEDURES:  Critical Care performed: No  Procedures   MEDICATIONS ORDERED IN ED: Medications - No data to display   IMPRESSION / MDM / ASSESSMENT AND PLAN / ED COURSE  I reviewed the triage vital signs and the nursing notes.                              Assessment and plan: Fall 89 year old male presents to the emergency department after patient had a mechanical fall while out working in his yard.  X-ray of the right knee indicates some partial hardware disruption that is age-indeterminate but no acute abnormalities noted.  X-ray of the  left knee unremarkable.  CTs of the head and cervical spine showed no acute abnormalities.  Patient's tetanus status is up-to-date.  Patient states that he has been ambulatory with no issues since fall.  Dressings were applied prior to discharge.  All patient questions were answered.        FINAL CLINICAL IMPRESSION(S) / ED DIAGNOSES   Final diagnoses:  Fall, initial encounter     Rx / DC Orders   ED Discharge Orders     None        Note:  This document was prepared using Dragon voice recognition software and may include unintentional dictation errors.   Pia [place], PA-C 06/19/23 2232    Corena Herter, MD 06/20/23 [phone]

## 2023-06-25 ENCOUNTER — Telehealth: Payer: Self-pay | Admitting: *Deleted

## 2023-06-25 NOTE — Telephone Encounter (Signed)
Transition Care Management Unsuccessful Follow-up Telephone Call  Date of discharge and from where:  [facility] 06/19/2023  Attempts:  1st Attempt  Reason for unsuccessful TCM follow-up call:  No answer/busy

## 2023-06-28 ENCOUNTER — Telehealth: Payer: Self-pay | Admitting: *Deleted

## 2023-06-28 NOTE — Telephone Encounter (Signed)
Transition Care Management Unsuccessful Follow-up Telephone Call  Date of discharge and from where:  [facility] 7/30 /2024  Attempts:  2nd Attempt  Reason for unsuccessful TCM follow-up call:  No answer/busy

## 2023-07-02 ENCOUNTER — Telehealth: Payer: Self-pay | Admitting: Internal Medicine

## 2023-07-02 DIAGNOSIS — S4351XA Sprain of right acromioclavicular joint, initial encounter: Secondary | ICD-10-CM | POA: Diagnosis not present

## 2023-07-02 NOTE — Telephone Encounter (Signed)
Pt wife came to talk with me and wanted to make sure that pt can take a medrol dose pack and a cortizone injection for his shoulder.  Pt was seen by Dr. Martha Clan at [facility] and wanted to check with Dr.B that all of the meds were okay.   Please call pt spouse (either number listed on chart) to let her know so she can pick up the medications or not

## 2023-07-02 NOTE — Telephone Encounter (Signed)
Wife notified.

## 2023-07-16 ENCOUNTER — Other Ambulatory Visit: Payer: Self-pay | Admitting: *Deleted

## 2023-07-16 DIAGNOSIS — D693 Immune thrombocytopenic purpura: Secondary | ICD-10-CM

## 2023-07-17 ENCOUNTER — Inpatient Hospital Stay: Payer: PPO | Attending: Internal Medicine

## 2023-07-17 DIAGNOSIS — D693 Immune thrombocytopenic purpura: Secondary | ICD-10-CM | POA: Diagnosis not present

## 2023-07-17 LAB — CBC WITH DIFFERENTIAL (CANCER CENTER ONLY)
Abs Immature Granulocytes: 0.03 10*3/uL (ref 0.00–0.07)
Basophils Absolute: 0 10*3/uL (ref 0.0–0.1)
Basophils Relative: 0 %
Eosinophils Absolute: 0.2 10*3/uL (ref 0.0–0.5)
Eosinophils Relative: 2 %
HCT: 37.9 % — ABNORMAL LOW (ref 39.0–52.0)
Hemoglobin: 12.5 g/dL — ABNORMAL LOW (ref 13.0–17.0)
Immature Granulocytes: 0 %
Lymphocytes Relative: 15 %
Lymphs Abs: 1.5 10*3/uL (ref 0.7–4.0)
MCH: 33.5 pg (ref 26.0–34.0)
MCHC: 33 g/dL (ref 30.0–36.0)
MCV: 101.6 fL — ABNORMAL HIGH (ref 80.0–100.0)
Monocytes Absolute: 0.9 10*3/uL (ref 0.1–1.0)
Monocytes Relative: 9 %
Neutro Abs: 7.5 10*3/uL (ref 1.7–7.7)
Neutrophils Relative %: 74 %
Platelet Count: 336 10*3/uL (ref 150–400)
RBC: 3.73 MIL/uL — ABNORMAL LOW (ref 4.22–5.81)
RDW: 13.2 % (ref 11.5–15.5)
WBC Count: 10.1 10*3/uL (ref 4.0–10.5)
nRBC: 0 % (ref 0.0–0.2)

## 2023-07-25 DIAGNOSIS — M25511 Pain in right shoulder: Secondary | ICD-10-CM | POA: Diagnosis not present

## 2023-08-13 DIAGNOSIS — H40051 Ocular hypertension, right eye: Secondary | ICD-10-CM | POA: Diagnosis not present

## 2023-08-14 ENCOUNTER — Inpatient Hospital Stay: Payer: PPO | Attending: Internal Medicine

## 2023-08-14 ENCOUNTER — Other Ambulatory Visit: Payer: Self-pay

## 2023-08-14 DIAGNOSIS — D693 Immune thrombocytopenic purpura: Secondary | ICD-10-CM | POA: Diagnosis not present

## 2023-08-14 LAB — CBC WITH DIFFERENTIAL (CANCER CENTER ONLY)
Abs Immature Granulocytes: 0.02 10*3/uL (ref 0.00–0.07)
Basophils Absolute: 0 10*3/uL (ref 0.0–0.1)
Basophils Relative: 0 %
Eosinophils Absolute: 0.1 10*3/uL (ref 0.0–0.5)
Eosinophils Relative: 2 %
HCT: 36.1 % — ABNORMAL LOW (ref 39.0–52.0)
Hemoglobin: 12.3 g/dL — ABNORMAL LOW (ref 13.0–17.0)
Immature Granulocytes: 0 %
Lymphocytes Relative: 16 %
Lymphs Abs: 1.3 10*3/uL (ref 0.7–4.0)
MCH: 34.6 pg — ABNORMAL HIGH (ref 26.0–34.0)
MCHC: 34.1 g/dL (ref 30.0–36.0)
MCV: 101.7 fL — ABNORMAL HIGH (ref 80.0–100.0)
Monocytes Absolute: 0.7 10*3/uL (ref 0.1–1.0)
Monocytes Relative: 9 %
Neutro Abs: 6 10*3/uL (ref 1.7–7.7)
Neutrophils Relative %: 73 %
Platelet Count: 280 10*3/uL (ref 150–400)
RBC: 3.55 MIL/uL — ABNORMAL LOW (ref 4.22–5.81)
RDW: 13.4 % (ref 11.5–15.5)
WBC Count: 8.1 10*3/uL (ref 4.0–10.5)
nRBC: 0 % (ref 0.0–0.2)

## 2023-08-27 ENCOUNTER — Encounter: Payer: Self-pay | Admitting: Internal Medicine

## 2023-08-27 ENCOUNTER — Inpatient Hospital Stay (HOSPITAL_BASED_OUTPATIENT_CLINIC_OR_DEPARTMENT_OTHER): Payer: PPO | Admitting: Internal Medicine

## 2023-08-27 ENCOUNTER — Inpatient Hospital Stay: Payer: PPO | Attending: Internal Medicine

## 2023-08-27 VITALS — BP 128/78 | HR 72 | Temp 98.8°F | Ht 67.0 in | Wt 176.4 lb

## 2023-08-27 DIAGNOSIS — M549 Dorsalgia, unspecified: Secondary | ICD-10-CM | POA: Insufficient documentation

## 2023-08-27 DIAGNOSIS — Z79899 Other long term (current) drug therapy: Secondary | ICD-10-CM | POA: Diagnosis not present

## 2023-08-27 DIAGNOSIS — D591 Autoimmune hemolytic anemia, unspecified: Secondary | ICD-10-CM | POA: Insufficient documentation

## 2023-08-27 DIAGNOSIS — Z8546 Personal history of malignant neoplasm of prostate: Secondary | ICD-10-CM | POA: Diagnosis not present

## 2023-08-27 DIAGNOSIS — Z79624 Long term (current) use of inhibitors of nucleotide synthesis: Secondary | ICD-10-CM | POA: Insufficient documentation

## 2023-08-27 DIAGNOSIS — M2550 Pain in unspecified joint: Secondary | ICD-10-CM | POA: Insufficient documentation

## 2023-08-27 DIAGNOSIS — Z881 Allergy status to other antibiotic agents status: Secondary | ICD-10-CM | POA: Diagnosis not present

## 2023-08-27 DIAGNOSIS — Z885 Allergy status to narcotic agent status: Secondary | ICD-10-CM | POA: Diagnosis not present

## 2023-08-27 DIAGNOSIS — D693 Immune thrombocytopenic purpura: Secondary | ICD-10-CM | POA: Diagnosis not present

## 2023-08-27 LAB — CMP (CANCER CENTER ONLY)
ALT: 16 U/L (ref 0–44)
AST: 20 U/L (ref 15–41)
Albumin: 3.6 g/dL (ref 3.5–5.0)
Alkaline Phosphatase: 76 U/L (ref 38–126)
Anion gap: 6 (ref 5–15)
BUN: 14 mg/dL (ref 8–23)
CO2: 25 mmol/L (ref 22–32)
Calcium: 8.6 mg/dL — ABNORMAL LOW (ref 8.9–10.3)
Chloride: 102 mmol/L (ref 98–111)
Creatinine: 0.98 mg/dL (ref 0.61–1.24)
GFR, Estimated: >60 mL/min (ref >60)
Glucose, Bld: 122 mg/dL — ABNORMAL HIGH (ref 70–99)
Potassium: 3.6 mmol/L (ref 3.5–5.1)
Sodium: 133 mmol/L — ABNORMAL LOW (ref 135–145)
Total Bilirubin: 0.6 mg/dL (ref 0.3–1.2)
Total Protein: 6.7 g/dL (ref 6.5–8.1)

## 2023-08-27 LAB — CBC WITH DIFFERENTIAL (CANCER CENTER ONLY)
Abs Immature Granulocytes: 0.04 10*3/uL (ref 0.00–0.07)
Basophils Absolute: 0 10*3/uL (ref 0.0–0.1)
Basophils Relative: 1 %
Eosinophils Absolute: 0.2 10*3/uL (ref 0.0–0.5)
Eosinophils Relative: 3 %
HCT: 37.6 % — ABNORMAL LOW (ref 39.0–52.0)
Hemoglobin: 12.7 g/dL — ABNORMAL LOW (ref 13.0–17.0)
Immature Granulocytes: 1 %
Lymphocytes Relative: 19 %
Lymphs Abs: 1.5 10*3/uL (ref 0.7–4.0)
MCH: 34 pg (ref 26.0–34.0)
MCHC: 33.8 g/dL (ref 30.0–36.0)
MCV: 100.5 fL — ABNORMAL HIGH (ref 80.0–100.0)
Monocytes Absolute: 0.7 10*3/uL (ref 0.1–1.0)
Monocytes Relative: 9 %
Neutro Abs: 5.6 10*3/uL (ref 1.7–7.7)
Neutrophils Relative %: 67 %
Platelet Count: 340 10*3/uL (ref 150–400)
RBC: 3.74 MIL/uL — ABNORMAL LOW (ref 4.22–5.81)
RDW: 13.2 % (ref 11.5–15.5)
WBC Count: 8.2 10*3/uL (ref 4.0–10.5)
nRBC: 0 % (ref 0.0–0.2)

## 2023-08-27 LAB — LACTATE DEHYDROGENASE: LDH: 145 U/L (ref 98–192)

## 2023-08-27 NOTE — Progress Notes (Signed)
C/o balance very poor. No lightheadedness or dizziness.

## 2023-08-27 NOTE — Progress Notes (Signed)
[facility] OFFICE PROGRESS NOTE  Patient Care Team: Kandyce Rud, MD as PCP - General (Family Medicine) Earna Coder, MD as Consulting Physician (Oncology)   SUMMARY OF ONCOLOGIC HISTORY:  #FEB 2016-  ITP  s/p Prednisone; OCT 2016- Relapse of ITP- Prednisone; JAN 13th- START RITUXAN q W x4 [finished Feb 8th]; March 10th 2017- N-plate q W- good response; Dec 2018- promacta 25 mg qOD.   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   # Hx of nose bleeds  INTERVAL HISTORY: Walking independently.  Accompanied by his wife.  A very pleasant 89 year old male patient with a history of autoimmune hemolytic anemia/ITP [Patient currently taking 1 promacta twice a week.] is here for follow-up.    c/o balance very poor. No lightheadedness or dizziness. NO recent falls.  NO falls. appetite is good. Denies any pain. No bleeding. No worsening fatigue.  Complains of joint pains.  Chronic.  Not any worse.  Review of Systems  Constitutional:  Negative for chills, diaphoresis, fever, malaise/fatigue and weight loss.  HENT:  Negative for nosebleeds and sore throat.   Eyes:  Negative for double vision.  Respiratory:  Negative for cough, hemoptysis, sputum production, shortness of breath and wheezing.   Cardiovascular:  Negative for chest pain, palpitations, orthopnea and leg swelling.  Gastrointestinal:  Negative for abdominal pain, blood in stool, constipation, diarrhea, heartburn, melena, nausea and vomiting.  Genitourinary:  Negative for dysuria, frequency and urgency.  Musculoskeletal:  Positive for back pain and joint pain.  Skin: Negative.  Negative for itching and rash.  Neurological:  Negative for dizziness, tingling, focal weakness, weakness and headaches.  Endo/Heme/Allergies:  Does not bruise/bleed easily.  Psychiatric/Behavioral:  Negative for depression. The patient is not nervous/anxious and does not  have insomnia.     PAST MEDICAL HISTORY :  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   Leukocytosis    Prostate cancer (HCC)    Shingles     PAST SURGICAL HISTORY :   Past Surgical History:  Procedure Laterality Date   artificial left eye     EYE SURGERY     HERNIA REPAIR     JOINT REPLACEMENT     PROSTATE SURGERY      FAMILY HISTORY :   Family History  Problem Relation Age of Onset   Cancer Mother    Multiple sclerosis Father     SOCIAL HISTORY:   Social History   Tobacco Use   Smoking status: Never   Smokeless tobacco: Never  Vaping Use   Vaping status: Never Used  Substance Use Topics   Alcohol use: Yes   Drug use: No    ALLERGIES:  is allergic to cephalexin and hydrocodone-acetaminophen.  MEDICATIONS:  Current Outpatient Medications  Medication Sig Dispense Refill   acetaminophen (TYLENOL) 500 MG tablet Take 500 mg by mouth every 6 (six) hours as needed for mild pain. Reported on 01/24/2016     Cholecalciferol (VITAMIN D3) 250 MCG (10000 UT) capsule Take 10,000 Units by mouth daily.     citalopram (CELEXA) 10 MG tablet Take 1 tablet by mouth daily.     eltrombopag (PROMACTA) 25 MG tablet TAKE 1 TABLET (25 MG TOTAL) BY MOUTH EVERY OTHER DAY. TAKE ON AN EMPTY  STOMACH, 1 HOUR BEFORE A MEAL OR 2 HOURS AFTER. 30 tablet 4   latanoprost (XALATAN) 0.005 % ophthalmic solution Place 1 drop into the right eye at bedtime.     lisinopril (PRINIVIL,ZESTRIL) 5 MG tablet Take 10 mg by mouth daily.      meclizine (ANTIVERT) 25 MG tablet Take 1 tablet by mouth as needed for dizziness.     mycophenolate (CELLCEPT) 500 MG tablet TAKE 2 TABLETS BY MOUTH EVERY MORNING 60 tablet 3   omeprazole (PRILOSEC) 40 MG capsule TAKE 1 CAPSULE BY MOUTH DAILY USUALLY 30 MINUTES BEFORE BREAKFAST 90 capsule 2    polyethylene glycol (MIRALAX / GLYCOLAX) packet Take 17 g by mouth daily as needed for mild constipation. 14 each 0   pravastatin (PRAVACHOL) 20 MG tablet Take 20 mg by mouth at bedtime.      No current facility-administered medications for this visit.    PHYSICAL EXAMINATION:   BP 128/78 (BP Location: Left Arm, Patient Position: Sitting, Cuff Size: Normal)   Pulse 72   Temp 98.8 F (37.1 C) (Oral)   Ht 5\' 7"  (1.702 m)   Wt 176 lb 6.4 oz (80 kg)   SpO2 99%   BMI 27.63 kg/m   Filed Weights   08/27/23 1439  Weight: 176 lb 6.4 oz (80 kg)     Physical Exam HENT:     Head: Normocephalic and atraumatic.     Mouth/Throat:     Pharynx: No oropharyngeal exudate.  Eyes:     Pupils: Pupils are equal, round, and reactive to light.  Cardiovascular:     Rate and Rhythm: Normal rate and regular rhythm.  Pulmonary:     Effort: No respiratory distress.     Breath sounds: No wheezing.  Abdominal:     General: Bowel sounds are normal. There is no distension.     Palpations: Abdomen is soft. There is no mass.     Tenderness: There is no abdominal tenderness. There is no guarding or rebound.  Musculoskeletal:        General: No tenderness. Normal range of motion.     Cervical back: Normal range of motion and neck supple.  Skin:    General: Skin is warm.  Neurological:     Mental Status: He is alert and oriented to person, place, and time.  Psychiatric:        Mood and Affect: Affect normal.      LABORATORY DATA:  I have reviewed the data as listed    Component Value Date/Time   NA 133 (L) 08/27/2023 1433   NA 131 (L) 03/17/2015 1425   K 3.6 08/27/2023 1433   K 3.9 03/17/2015 1425   CL 102 08/27/2023 1433   CL 101 03/17/2015 1425   CO2 25 08/27/2023 1433   CO2 26 03/17/2015 1425   GLUCOSE 122 (H) 08/27/2023 1433   GLUCOSE 161 (H) 03/17/2015 1425   BUN 14 08/27/2023 1433   BUN 17 03/17/2015 1425   CREATININE 0.98 08/27/2023 1433   CREATININE 0.90 03/17/2015 1425    CALCIUM 8.6 (L) 08/27/2023 1433   CALCIUM 8.5 (L) 03/17/2015 1425   PROT 6.7 08/27/2023 1433   PROT 6.7 03/17/2015 1425   ALBUMIN 3.6 08/27/2023 1433   ALBUMIN 3.7 03/17/2015 1425   AST 20 08/27/2023 1433   ALT 16 08/27/2023 1433   ALT 23 03/17/2015 1425   ALKPHOS 76 08/27/2023 1433   ALKPHOS 44 03/17/2015 1425   BILITOT 0.6 08/27/2023 1433   GFRNONAA >  60 08/27/2023 1433   GFRNONAA >60 03/17/2015 1425   GFRAA >60 08/02/2020 1322   GFRAA >60 03/17/2015 1425    No results found for: "SPEP", "UPEP"  Lab Results  Component Value Date   WBC 8.2 08/27/2023   NEUTROABS 5.6 08/27/2023   HGB 12.7 (L) 08/27/2023   HCT 37.6 (L) 08/27/2023   MCV 100.5 (H) 08/27/2023   PLT 340 08/27/2023      Chemistry      Component Value Date/Time   NA 133 (L) 08/27/2023 1433   NA 131 (L) 03/17/2015 1425   K 3.6 08/27/2023 1433   K 3.9 03/17/2015 1425   CL 102 08/27/2023 1433   CL 101 03/17/2015 1425   CO2 25 08/27/2023 1433   CO2 26 03/17/2015 1425   BUN 14 08/27/2023 1433   BUN 17 03/17/2015 1425   CREATININE 0.98 08/27/2023 1433   CREATININE 0.90 03/17/2015 1425      Component Value Date/Time   CALCIUM 8.6 (L) 08/27/2023 1433   CALCIUM 8.5 (L) 03/17/2015 1425   ALKPHOS 76 08/27/2023 1433   ALKPHOS 44 03/17/2015 1425   AST 20 08/27/2023 1433   ALT 16 08/27/2023 1433   ALT 23 03/17/2015 1425   BILITOT 0.6 08/27/2023 1433       ASSESSMENT & PLAN:   Idiopathic thrombocytopenic purpura (HCC) #Chronic ITP currently on  Promacta 25 mg every other day. Currently  Platelets 340;  On promacta Monday/Thursdays given insurance issues. Continue cellcept 500 mg a AM.   # Hemolytic anemia hemoglobin -12.7; iron studies;ferritin;  LDH normal. Continue CellCept 500 milligrams a AM- stable.   # Hypocalcemia- 8.8; continue ca+vit D once a day. stable.   # Hx of falls- ? Etiology. S/p evaluation with Maureen-  stable.   # Insomnia: Hold off any medications given risk of falls;  stable.   #  Prostatism: Elevated PSA [[facility]; Urology]- currently monitored. Defer to Urology.   # DISPOSITION: # labs q 4 weeks- cbc;  # follow up in 4 months/labs-cbc/cmp/LDH; haptoglobin; reticulocyte count- Dr.B       Earna Coder, MD 08/27/2023 3:30 PM

## 2023-08-27 NOTE — Assessment & Plan Note (Addendum)
#  Chronic ITP currently on  Promacta 25 mg every other day. Currently  Platelets 340;  On promacta Monday/Thursdays given insurance issues. Continue cellcept 500 mg a AM.   # Hemolytic anemia hemoglobin -12.7; iron studies;ferritin;  LDH normal. Continue CellCept 500 milligrams a AM- stable.   # Hypocalcemia- 8.8; continue ca+vit D once a day. stable.   # Hx of falls- ? Etiology. S/p evaluation with Maureen-  stable.   # Insomnia: Hold off any medications given risk of falls;  stable.   # Prostatism: Elevated PSA [[facility]; Urology]- currently monitored. Defer to Urology.   # DISPOSITION: # labs q 4 weeks- cbc;  # follow up in 4 months/labs-cbc/cmp/LDH; haptoglobin; reticulocyte count- Dr.B

## 2023-08-28 LAB — HAPTOGLOBIN: Haptoglobin: 233 mg/dL (ref 38–329)

## 2023-09-17 DIAGNOSIS — M25521 Pain in right elbow: Secondary | ICD-10-CM | POA: Diagnosis not present

## 2023-09-19 ENCOUNTER — Other Ambulatory Visit (HOSPITAL_COMMUNITY): Payer: Self-pay

## 2023-09-21 ENCOUNTER — Telehealth: Payer: Self-pay

## 2023-09-21 NOTE — Telephone Encounter (Signed)
Oral Oncology Patient Advocate Encounter   Received notification that patient is due for re-enrollment for assistance for Promacta through [employer].   Re-enrollment process has been initiated and will be submitted upon completion of necessary documents.  [employer]PA[employer]'s phone number [phone].   I will continue to follow until final determination.   [name], CPhT Oncology Pharmacy Patient Advocate  [facility]  [phone] (phone) [phone] (fax) 09/21/2023 10:38 AM

## 2023-09-21 NOTE — Telephone Encounter (Signed)
Called and left VM for patient to return my call to set up time to obtain signature and Proof of Income for re-enrollment application. Patient has appointment for labs on Monday 09/24/23 at 1:45pm, I will see about meeting patient in clinic to obtain then.    [name], CPhT Oncology Pharmacy Patient Advocate  [facility]  [phone] (phone) [phone] (fax) 09/21/2023 10:40 AM

## 2023-09-24 ENCOUNTER — Inpatient Hospital Stay: Payer: PPO | Attending: Internal Medicine

## 2023-09-24 DIAGNOSIS — D693 Immune thrombocytopenic purpura: Secondary | ICD-10-CM | POA: Insufficient documentation

## 2023-09-24 LAB — CBC WITH DIFFERENTIAL (CANCER CENTER ONLY)
Abs Immature Granulocytes: 0.03 10*3/uL (ref 0.00–0.07)
Basophils Absolute: 0.1 10*3/uL (ref 0.0–0.1)
Basophils Relative: 1 %
Eosinophils Absolute: 0.2 10*3/uL (ref 0.0–0.5)
Eosinophils Relative: 3 %
HCT: 39.4 % (ref 39.0–52.0)
Hemoglobin: 13.3 g/dL (ref 13.0–17.0)
Immature Granulocytes: 0 %
Lymphocytes Relative: 19 %
Lymphs Abs: 1.5 10*3/uL (ref 0.7–4.0)
MCH: 34.2 pg — ABNORMAL HIGH (ref 26.0–34.0)
MCHC: 33.8 g/dL (ref 30.0–36.0)
MCV: 101.3 fL — ABNORMAL HIGH (ref 80.0–100.0)
Monocytes Absolute: 0.7 10*3/uL (ref 0.1–1.0)
Monocytes Relative: 8 %
Neutro Abs: 5.4 10*3/uL (ref 1.7–7.7)
Neutrophils Relative %: 69 %
Platelet Count: 330 10*3/uL (ref 150–400)
RBC: 3.89 MIL/uL — ABNORMAL LOW (ref 4.22–5.81)
RDW: 13.5 % (ref 11.5–15.5)
WBC Count: 7.9 10*3/uL (ref 4.0–10.5)
nRBC: 0 % (ref 0.0–0.2)

## 2023-10-01 NOTE — Telephone Encounter (Signed)
Patient's wife, Lewanda Rife, returned my call. She indicated that they are above the income limit for NPAF for 2025. I let her know I would place the patient on our [facility] wait list and would get a grant for them if funding becomes available. I also briefly talked about the Medicare Payment Plan for 2025 and the $2,000 OOP Max for all Medicare patients. [name] indicated she would contact her and patient's plan to learn more about this.    [name], CPhT Oncology Pharmacy Patient Advocate  [facility]  [phone] (phone) [phone] (fax) 10/01/2023 3:42 PM

## 2023-10-08 ENCOUNTER — Other Ambulatory Visit: Payer: Self-pay | Admitting: Pharmacist

## 2023-10-08 DIAGNOSIS — D693 Immune thrombocytopenic purpura: Secondary | ICD-10-CM

## 2023-10-08 MED ORDER — ELTROMBOPAG OLAMINE 25 MG PO TABS
ORAL_TABLET | ORAL | 4 refills | Status: AC
Start: 2023-10-08 — End: ?

## 2023-10-09 DIAGNOSIS — C61 Malignant neoplasm of prostate: Secondary | ICD-10-CM | POA: Diagnosis not present

## 2023-10-09 DIAGNOSIS — Z8546 Personal history of malignant neoplasm of prostate: Secondary | ICD-10-CM | POA: Diagnosis not present

## 2023-10-09 DIAGNOSIS — Z08 Encounter for follow-up examination after completed treatment for malignant neoplasm: Secondary | ICD-10-CM | POA: Diagnosis not present

## 2023-10-12 DIAGNOSIS — R29898 Other symptoms and signs involving the musculoskeletal system: Secondary | ICD-10-CM | POA: Diagnosis not present

## 2023-10-12 DIAGNOSIS — R2681 Unsteadiness on feet: Secondary | ICD-10-CM | POA: Diagnosis not present

## 2023-10-16 DIAGNOSIS — Z23 Encounter for immunization: Secondary | ICD-10-CM | POA: Diagnosis not present

## 2023-10-16 DIAGNOSIS — I10 Essential (primary) hypertension: Secondary | ICD-10-CM | POA: Diagnosis not present

## 2023-10-16 DIAGNOSIS — R55 Syncope and collapse: Secondary | ICD-10-CM | POA: Diagnosis not present

## 2023-10-16 DIAGNOSIS — E7800 Pure hypercholesterolemia, unspecified: Secondary | ICD-10-CM | POA: Diagnosis not present

## 2023-10-22 ENCOUNTER — Inpatient Hospital Stay: Payer: PPO | Attending: Internal Medicine

## 2023-10-22 DIAGNOSIS — D693 Immune thrombocytopenic purpura: Secondary | ICD-10-CM | POA: Insufficient documentation

## 2023-10-22 LAB — CBC WITH DIFFERENTIAL (CANCER CENTER ONLY)
Abs Immature Granulocytes: 0.02 10*3/uL (ref 0.00–0.07)
Basophils Absolute: 0.1 10*3/uL (ref 0.0–0.1)
Basophils Relative: 1 %
Eosinophils Absolute: 0.2 10*3/uL (ref 0.0–0.5)
Eosinophils Relative: 2 %
HCT: 38.2 % — ABNORMAL LOW (ref 39.0–52.0)
Hemoglobin: 12.8 g/dL — ABNORMAL LOW (ref 13.0–17.0)
Immature Granulocytes: 0 %
Lymphocytes Relative: 20 %
Lymphs Abs: 1.7 10*3/uL (ref 0.7–4.0)
MCH: 34.1 pg — ABNORMAL HIGH (ref 26.0–34.0)
MCHC: 33.5 g/dL (ref 30.0–36.0)
MCV: 101.9 fL — ABNORMAL HIGH (ref 80.0–100.0)
Monocytes Absolute: 0.7 10*3/uL (ref 0.1–1.0)
Monocytes Relative: 9 %
Neutro Abs: 5.7 10*3/uL (ref 1.7–7.7)
Neutrophils Relative %: 68 %
Platelet Count: 383 10*3/uL (ref 150–400)
RBC: 3.75 MIL/uL — ABNORMAL LOW (ref 4.22–5.81)
RDW: 12.9 % (ref 11.5–15.5)
WBC Count: 8.3 10*3/uL (ref 4.0–10.5)
nRBC: 0 % (ref 0.0–0.2)

## 2023-10-29 DIAGNOSIS — E7800 Pure hypercholesterolemia, unspecified: Secondary | ICD-10-CM | POA: Diagnosis not present

## 2023-10-29 DIAGNOSIS — Z79899 Other long term (current) drug therapy: Secondary | ICD-10-CM | POA: Diagnosis not present

## 2023-10-29 DIAGNOSIS — Z125 Encounter for screening for malignant neoplasm of prostate: Secondary | ICD-10-CM | POA: Diagnosis not present

## 2023-11-06 DIAGNOSIS — E7800 Pure hypercholesterolemia, unspecified: Secondary | ICD-10-CM | POA: Diagnosis not present

## 2023-11-06 DIAGNOSIS — R972 Elevated prostate specific antigen [PSA]: Secondary | ICD-10-CM | POA: Diagnosis not present

## 2023-11-06 DIAGNOSIS — R2681 Unsteadiness on feet: Secondary | ICD-10-CM | POA: Diagnosis not present

## 2023-11-06 DIAGNOSIS — Z0000 Encounter for general adult medical examination without abnormal findings: Secondary | ICD-10-CM | POA: Diagnosis not present

## 2023-11-06 DIAGNOSIS — C61 Malignant neoplasm of prostate: Secondary | ICD-10-CM | POA: Diagnosis not present

## 2023-11-06 DIAGNOSIS — Z79899 Other long term (current) drug therapy: Secondary | ICD-10-CM | POA: Diagnosis not present

## 2023-11-06 DIAGNOSIS — M6281 Muscle weakness (generalized): Secondary | ICD-10-CM | POA: Diagnosis not present

## 2023-11-06 DIAGNOSIS — D693 Immune thrombocytopenic purpura: Secondary | ICD-10-CM | POA: Diagnosis not present

## 2023-11-06 DIAGNOSIS — I10 Essential (primary) hypertension: Secondary | ICD-10-CM | POA: Diagnosis not present

## 2023-11-06 DIAGNOSIS — R29898 Other symptoms and signs involving the musculoskeletal system: Secondary | ICD-10-CM | POA: Diagnosis not present

## 2023-11-09 DIAGNOSIS — M6281 Muscle weakness (generalized): Secondary | ICD-10-CM | POA: Diagnosis not present

## 2023-11-09 DIAGNOSIS — R2681 Unsteadiness on feet: Secondary | ICD-10-CM | POA: Diagnosis not present

## 2023-11-12 DIAGNOSIS — R2681 Unsteadiness on feet: Secondary | ICD-10-CM | POA: Diagnosis not present

## 2023-11-12 DIAGNOSIS — M6281 Muscle weakness (generalized): Secondary | ICD-10-CM | POA: Diagnosis not present

## 2023-11-19 ENCOUNTER — Inpatient Hospital Stay: Payer: PPO

## 2023-11-19 DIAGNOSIS — D693 Immune thrombocytopenic purpura: Secondary | ICD-10-CM | POA: Diagnosis not present

## 2023-11-19 LAB — CBC WITH DIFFERENTIAL (CANCER CENTER ONLY)
Abs Immature Granulocytes: 0.05 10*3/uL (ref 0.00–0.07)
Basophils Absolute: 0 10*3/uL (ref 0.0–0.1)
Basophils Relative: 0 %
Eosinophils Absolute: 0.1 10*3/uL (ref 0.0–0.5)
Eosinophils Relative: 1 %
HCT: 38.1 % — ABNORMAL LOW (ref 39.0–52.0)
Hemoglobin: 12.8 g/dL — ABNORMAL LOW (ref 13.0–17.0)
Immature Granulocytes: 1 %
Lymphocytes Relative: 11 %
Lymphs Abs: 1.2 10*3/uL (ref 0.7–4.0)
MCH: 34 pg (ref 26.0–34.0)
MCHC: 33.6 g/dL (ref 30.0–36.0)
MCV: 101.1 fL — ABNORMAL HIGH (ref 80.0–100.0)
Monocytes Absolute: 0.7 10*3/uL (ref 0.1–1.0)
Monocytes Relative: 7 %
Neutro Abs: 8.6 10*3/uL — ABNORMAL HIGH (ref 1.7–7.7)
Neutrophils Relative %: 80 %
Platelet Count: 267 10*3/uL (ref 150–400)
RBC: 3.77 MIL/uL — ABNORMAL LOW (ref 4.22–5.81)
RDW: 13.4 % (ref 11.5–15.5)
WBC Count: 10.8 10*3/uL — ABNORMAL HIGH (ref 4.0–10.5)
nRBC: 0 % (ref 0.0–0.2)

## 2023-11-20 DIAGNOSIS — R2681 Unsteadiness on feet: Secondary | ICD-10-CM | POA: Diagnosis not present

## 2023-11-20 DIAGNOSIS — M6281 Muscle weakness (generalized): Secondary | ICD-10-CM | POA: Diagnosis not present

## 2023-11-26 DIAGNOSIS — R2681 Unsteadiness on feet: Secondary | ICD-10-CM | POA: Diagnosis not present

## 2023-11-26 DIAGNOSIS — M6281 Muscle weakness (generalized): Secondary | ICD-10-CM | POA: Diagnosis not present

## 2023-11-29 DIAGNOSIS — M6281 Muscle weakness (generalized): Secondary | ICD-10-CM | POA: Diagnosis not present

## 2023-11-29 DIAGNOSIS — R2681 Unsteadiness on feet: Secondary | ICD-10-CM | POA: Diagnosis not present

## 2023-12-03 DIAGNOSIS — M6281 Muscle weakness (generalized): Secondary | ICD-10-CM | POA: Diagnosis not present

## 2023-12-03 DIAGNOSIS — R2681 Unsteadiness on feet: Secondary | ICD-10-CM | POA: Diagnosis not present

## 2023-12-06 DIAGNOSIS — M6281 Muscle weakness (generalized): Secondary | ICD-10-CM | POA: Diagnosis not present

## 2023-12-06 DIAGNOSIS — R2681 Unsteadiness on feet: Secondary | ICD-10-CM | POA: Diagnosis not present

## 2023-12-10 ENCOUNTER — Telehealth: Payer: Self-pay | Admitting: Internal Medicine

## 2023-12-10 DIAGNOSIS — D693 Immune thrombocytopenic purpura: Secondary | ICD-10-CM

## 2023-12-11 DIAGNOSIS — L570 Actinic keratosis: Secondary | ICD-10-CM | POA: Diagnosis not present

## 2023-12-11 DIAGNOSIS — Z85820 Personal history of malignant melanoma of skin: Secondary | ICD-10-CM | POA: Diagnosis not present

## 2023-12-11 DIAGNOSIS — D2261 Melanocytic nevi of right upper limb, including shoulder: Secondary | ICD-10-CM | POA: Diagnosis not present

## 2023-12-11 DIAGNOSIS — D225 Melanocytic nevi of trunk: Secondary | ICD-10-CM | POA: Diagnosis not present

## 2023-12-11 DIAGNOSIS — Z85828 Personal history of other malignant neoplasm of skin: Secondary | ICD-10-CM | POA: Diagnosis not present

## 2023-12-11 DIAGNOSIS — D2271 Melanocytic nevi of right lower limb, including hip: Secondary | ICD-10-CM | POA: Diagnosis not present

## 2023-12-11 DIAGNOSIS — D2272 Melanocytic nevi of left lower limb, including hip: Secondary | ICD-10-CM | POA: Diagnosis not present

## 2023-12-11 DIAGNOSIS — L821 Other seborrheic keratosis: Secondary | ICD-10-CM | POA: Diagnosis not present

## 2023-12-11 DIAGNOSIS — D2262 Melanocytic nevi of left upper limb, including shoulder: Secondary | ICD-10-CM | POA: Diagnosis not present

## 2023-12-13 ENCOUNTER — Encounter: Payer: Self-pay | Admitting: Internal Medicine

## 2023-12-13 ENCOUNTER — Telehealth: Payer: Self-pay

## 2023-12-13 ENCOUNTER — Telehealth: Payer: Self-pay | Admitting: *Deleted

## 2023-12-13 ENCOUNTER — Other Ambulatory Visit: Payer: Self-pay | Admitting: *Deleted

## 2023-12-13 MED ORDER — MYCOPHENOLATE MOFETIL 500 MG PO TABS: 500.0000 mg | ORAL_TABLET | Freq: Every day | ORAL | 3 refills | Status: AC

## 2023-12-13 NOTE — Telephone Encounter (Signed)
Dr. Leonard Schwartz, your last office note states take 500 mg q am, but last refill looks like 2 in the am. Should it be take one or two in the am?

## 2023-12-13 NOTE — Telephone Encounter (Signed)
Sent message to Dr. B for correct dosing.

## 2023-12-13 NOTE — Telephone Encounter (Signed)
Pharmacy called to get rx for the Mycophenolate  and I called the pharmacy and let them know that I sent the rx in. And it is 1 tablet a day

## 2023-12-13 NOTE — Telephone Encounter (Signed)
Patients wife has stopped by the desk to request refill of cellcept. She states [facility] pharmacy has requested 3 times. She would like the refill called in please.   Please call [phone]

## 2023-12-13 NOTE — Progress Notes (Signed)
 error

## 2023-12-13 NOTE — Telephone Encounter (Signed)
The wife of Mr. Topper called today saying that she has had several calls that she sent out to get a refill of his CellCept as well as the pharmacy.  I called back to the wife and just let her know that we did get a pharmacy request and someone had called and also however I needed to talk to Dr. Donneta Romberg because 1 place he was only taking it 1 time a day and another said he would take 2 pills a day.  Dr. Donneta Romberg I called him because it is on his half day and he said that he was on 2 pills a day but then cost wise went down to 1 pill a day and so that is what I sent into the pharmacy.  The wife says that she thought they were talking about Promacta because it is very expensive and they are not having anything to help them with the expense so she wanted to try to decrease them that or even get him off of that.  She says that will stay with the 1 pill cellcept and then when they see him next time they will talk about whether or not they can get rid of the [facility]

## 2023-12-17 ENCOUNTER — Inpatient Hospital Stay: Payer: PPO | Attending: Internal Medicine

## 2023-12-17 DIAGNOSIS — D693 Immune thrombocytopenic purpura: Secondary | ICD-10-CM | POA: Insufficient documentation

## 2023-12-17 LAB — CBC WITH DIFFERENTIAL (CANCER CENTER ONLY)
Abs Immature Granulocytes: 0.04 10*3/uL (ref 0.00–0.07)
Basophils Absolute: 0 10*3/uL (ref 0.0–0.1)
Basophils Relative: 1 %
Eosinophils Absolute: 0.2 10*3/uL (ref 0.0–0.5)
Eosinophils Relative: 2 %
HCT: 41.1 % (ref 39.0–52.0)
Hemoglobin: 13.8 g/dL (ref 13.0–17.0)
Immature Granulocytes: 1 %
Lymphocytes Relative: 17 %
Lymphs Abs: 1.3 10*3/uL (ref 0.7–4.0)
MCH: 33.4 pg (ref 26.0–34.0)
MCHC: 33.6 g/dL (ref 30.0–36.0)
MCV: 99.5 fL (ref 80.0–100.0)
Monocytes Absolute: 0.6 10*3/uL (ref 0.1–1.0)
Monocytes Relative: 8 %
Neutro Abs: 5.3 10*3/uL (ref 1.7–7.7)
Neutrophils Relative %: 71 %
Platelet Count: 298 10*3/uL (ref 150–400)
RBC: 4.13 MIL/uL — ABNORMAL LOW (ref 4.22–5.81)
RDW: 13.4 % (ref 11.5–15.5)
WBC Count: 7.4 10*3/uL (ref 4.0–10.5)
nRBC: 0 % (ref 0.0–0.2)

## 2023-12-18 DIAGNOSIS — R2681 Unsteadiness on feet: Secondary | ICD-10-CM | POA: Diagnosis not present

## 2023-12-18 DIAGNOSIS — M6281 Muscle weakness (generalized): Secondary | ICD-10-CM | POA: Diagnosis not present

## 2023-12-20 DIAGNOSIS — M6281 Muscle weakness (generalized): Secondary | ICD-10-CM | POA: Diagnosis not present

## 2023-12-20 DIAGNOSIS — R2681 Unsteadiness on feet: Secondary | ICD-10-CM | POA: Diagnosis not present

## 2023-12-24 DIAGNOSIS — H40051 Ocular hypertension, right eye: Secondary | ICD-10-CM | POA: Diagnosis not present

## 2023-12-26 DIAGNOSIS — M6281 Muscle weakness (generalized): Secondary | ICD-10-CM | POA: Diagnosis not present

## 2023-12-26 DIAGNOSIS — R2681 Unsteadiness on feet: Secondary | ICD-10-CM | POA: Diagnosis not present

## 2023-12-28 ENCOUNTER — Inpatient Hospital Stay (HOSPITAL_BASED_OUTPATIENT_CLINIC_OR_DEPARTMENT_OTHER): Payer: PPO | Admitting: Internal Medicine

## 2023-12-28 ENCOUNTER — Inpatient Hospital Stay: Payer: PPO | Attending: Internal Medicine

## 2023-12-28 ENCOUNTER — Encounter: Payer: Self-pay | Admitting: Internal Medicine

## 2023-12-28 VITALS — BP 134/78 | HR 63 | Resp 20 | Wt 173.0 lb

## 2023-12-28 DIAGNOSIS — N400 Enlarged prostate without lower urinary tract symptoms: Secondary | ICD-10-CM | POA: Insufficient documentation

## 2023-12-28 DIAGNOSIS — G4700 Insomnia, unspecified: Secondary | ICD-10-CM | POA: Insufficient documentation

## 2023-12-28 DIAGNOSIS — D591 Autoimmune hemolytic anemia, unspecified: Secondary | ICD-10-CM | POA: Insufficient documentation

## 2023-12-28 DIAGNOSIS — M2550 Pain in unspecified joint: Secondary | ICD-10-CM | POA: Diagnosis not present

## 2023-12-28 DIAGNOSIS — D693 Immune thrombocytopenic purpura: Secondary | ICD-10-CM | POA: Diagnosis not present

## 2023-12-28 DIAGNOSIS — Z79624 Long term (current) use of inhibitors of nucleotide synthesis: Secondary | ICD-10-CM | POA: Diagnosis not present

## 2023-12-28 LAB — RETICULOCYTES
Immature Retic Fract: 7.3 % (ref 2.3–15.9)
RBC.: 3.75 MIL/uL — ABNORMAL LOW (ref 4.22–5.81)
Retic Count, Absolute: 57 10*3/uL (ref 19.0–186.0)
Retic Ct Pct: 1.5 % (ref 0.4–3.1)

## 2023-12-28 LAB — CBC WITH DIFFERENTIAL (CANCER CENTER ONLY)
Abs Immature Granulocytes: 0.03 10*3/uL (ref 0.00–0.07)
Basophils Absolute: 0 10*3/uL (ref 0.0–0.1)
Basophils Relative: 0 %
Eosinophils Absolute: 0.2 10*3/uL (ref 0.0–0.5)
Eosinophils Relative: 3 %
HCT: 37.1 % — ABNORMAL LOW (ref 39.0–52.0)
Hemoglobin: 12.5 g/dL — ABNORMAL LOW (ref 13.0–17.0)
Immature Granulocytes: 0 %
Lymphocytes Relative: 18 %
Lymphs Abs: 1.4 10*3/uL (ref 0.7–4.0)
MCH: 33.6 pg (ref 26.0–34.0)
MCHC: 33.7 g/dL (ref 30.0–36.0)
MCV: 99.7 fL (ref 80.0–100.0)
Monocytes Absolute: 0.6 10*3/uL (ref 0.1–1.0)
Monocytes Relative: 9 %
Neutro Abs: 5.1 10*3/uL (ref 1.7–7.7)
Neutrophils Relative %: 70 %
Platelet Count: 315 10*3/uL (ref 150–400)
RBC: 3.72 MIL/uL — ABNORMAL LOW (ref 4.22–5.81)
RDW: 13.4 % (ref 11.5–15.5)
WBC Count: 7.3 10*3/uL (ref 4.0–10.5)
nRBC: 0 % (ref 0.0–0.2)

## 2023-12-28 LAB — CMP (CANCER CENTER ONLY)
ALT: 21 U/L (ref 0–44)
AST: 25 U/L (ref 15–41)
Albumin: 3.7 g/dL (ref 3.5–5.0)
Alkaline Phosphatase: 73 U/L (ref 38–126)
Anion gap: 6 (ref 5–15)
BUN: 18 mg/dL (ref 8–23)
CO2: 26 mmol/L (ref 22–32)
Calcium: 8.9 mg/dL (ref 8.9–10.3)
Chloride: 99 mmol/L (ref 98–111)
Creatinine: 1.11 mg/dL (ref 0.61–1.24)
GFR, Estimated: >60 mL/min (ref >60)
Glucose, Bld: 123 mg/dL — ABNORMAL HIGH (ref 70–99)
Potassium: 3.9 mmol/L (ref 3.5–5.1)
Sodium: 131 mmol/L — ABNORMAL LOW (ref 135–145)
Total Bilirubin: 0.6 mg/dL (ref 0.0–1.2)
Total Protein: 6.7 g/dL (ref 6.5–8.1)

## 2023-12-28 LAB — LACTATE DEHYDROGENASE: LDH: 185 U/L (ref 98–192)

## 2023-12-28 NOTE — Progress Notes (Signed)
 Patient has no concerns

## 2023-12-28 NOTE — Progress Notes (Signed)
 [facility] OFFICE PROGRESS NOTE  Patient Care Team: [name], MD as PCP - General (Family Medicine) [name], MD as Consulting Physician (Oncology)   SUMMARY OF ONCOLOGIC HISTORY:  #FEB 2016-  ITP  s/p Prednisone ; OCT 2016- Relapse of ITP- Prednisone ; JAN 13th- START RITUXAN  q W x4 [finished Feb 8th]; March 10th 2017- N-plate q W- good response; Dec 2018- promacta  25 mg qOD.   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   # Hx of nose bleeds  INTERVAL HISTORY: Walking independently.  Accompanied by his wife.  A very pleasant 89 year old male patient with a history of autoimmune hemolytic anemia/ITP [Patient currently taking 1 promacta  twice a week.] is here for follow-up.    No lightheadedness or dizziness. NO recent falls. Appetite is good. Denies any pain. No bleeding. No worsening fatigue.  Complains of joint pains.  Chronic.  Not any worse.  Review of Systems  Constitutional:  Negative for chills, diaphoresis, fever, malaise/fatigue and weight loss.  HENT:  Negative for nosebleeds and sore throat.   Eyes:  Negative for double vision.  Respiratory:  Negative for cough, hemoptysis, sputum production, shortness of breath and wheezing.   Cardiovascular:  Negative for chest pain, palpitations, orthopnea and leg swelling.  Gastrointestinal:  Negative for abdominal pain, blood in stool, constipation, diarrhea, heartburn, melena, nausea and vomiting.  Genitourinary:  Negative for dysuria, frequency and urgency.  Musculoskeletal:  Positive for back pain and joint pain.  Skin: Negative.  Negative for itching and rash.  Neurological:  Negative for dizziness, tingling, focal weakness, weakness and headaches.  Endo/Heme/Allergies:  Does not bruise/bleed easily.  Psychiatric/Behavioral:  Negative for depression. The patient is not nervous/anxious and does not have insomnia.     PAST MEDICAL  HISTORY :  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   Leukocytosis    Prostate cancer (HCC)    Shingles     PAST SURGICAL HISTORY :   Past Surgical History:  Procedure Laterality Date   artificial left eye     EYE SURGERY     HERNIA REPAIR     JOINT REPLACEMENT     PROSTATE SURGERY      FAMILY HISTORY :   Family History  Problem Relation Age of Onset   Cancer Mother    Multiple sclerosis Father     SOCIAL HISTORY:   Social History   Tobacco Use   Smoking status: Never   Smokeless tobacco: Never  Vaping Use   Vaping status: Never Used  Substance Use Topics   Alcohol  use: Yes   Drug use: No    ALLERGIES:  is allergic to cephalexin  and hydrocodone-acetaminophen .  MEDICATIONS:  Current Outpatient Medications  Medication Sig Dispense Refill   acetaminophen  (TYLENOL ) 500 MG tablet Take 500 mg by mouth every 6 (six) hours as needed for mild pain. Reported on 01/24/2016     Cholecalciferol  (VITAMIN D3) 250 MCG (10000 UT) capsule Take 10,000 Units by mouth daily.     citalopram  (CELEXA ) 10 MG tablet Take 1 tablet by mouth daily.     eltrombopag  (PROMACTA ) 25 MG tablet TAKE 1 TABLET (25 MG TOTAL) BY MOUTH EVERY OTHER DAY. TAKE ON AN EMPTY STOMACH, 1 HOUR BEFORE A MEAL OR  2 HOURS AFTER. 30 tablet 4   meclizine (ANTIVERT) 25 MG tablet Take 1 tablet by mouth as needed for dizziness.     mycophenolate  (CELLCEPT ) 500 MG tablet Take 1 tablet (500 mg total) by mouth daily. 30 tablet 3   omeprazole  (PRILOSEC) 40 MG capsule TAKE 1 CAPSULE BY MOUTH DAILY USUALLY 30 MINUTES BEFORE BREAKFAST 90 capsule 2   polyethylene glycol (MIRALAX  / GLYCOLAX ) packet Take 17 g by mouth daily as needed for mild constipation. 14 each 0   pravastatin  (PRAVACHOL ) 20 MG tablet Take 20 mg by mouth at bedtime.      propranolol   (INDERAL ) 20 MG tablet Take 1 tablet by mouth 2 (two) times daily.     lisinopril  (PRINIVIL ,ZESTRIL ) 5 MG tablet Take 10 mg by mouth daily.  (Patient not taking: Reported on 12/28/2023)     No current facility-administered medications for this visit.    PHYSICAL EXAMINATION:   BP 134/78   Pulse 63   Resp 20   Wt 173 lb (78.5 kg)   SpO2 100%   BMI 27.10 kg/m   Filed Weights   12/28/23 1457  Weight: 173 lb (78.5 kg)      Physical Exam HENT:     Head: Normocephalic and atraumatic.     Mouth/Throat:     Pharynx: No oropharyngeal exudate.  Eyes:     Pupils: Pupils are equal, round, and reactive to light.  Cardiovascular:     Rate and Rhythm: Normal rate and regular rhythm.  Pulmonary:     Effort: No respiratory distress.     Breath sounds: No wheezing.  Abdominal:     General: Bowel sounds are normal. There is no distension.     Palpations: Abdomen is soft. There is no mass.     Tenderness: There is no abdominal tenderness. There is no guarding or rebound.  Musculoskeletal:        General: No tenderness. Normal range of motion.     Cervical back: Normal range of motion and neck supple.  Skin:    General: Skin is warm.  Neurological:     Mental Status: He is alert and oriented to person, place, and time.  Psychiatric:        Mood and Affect: Affect normal.      LABORATORY DATA:  I have reviewed the data as listed    Component Value Date/Time   NA 131 (L) 12/28/2023 1431   NA 131 (L) 03/17/2015 1425   K 3.9 12/28/2023 1431   K 3.9 03/17/2015 1425   CL 99 12/28/2023 1431   CL 101 03/17/2015 1425   CO2 26 12/28/2023 1431   CO2 26 03/17/2015 1425   GLUCOSE 123 (H) 12/28/2023 1431   GLUCOSE 161 (H) 03/17/2015 1425   BUN 18 12/28/2023 1431   BUN 17 03/17/2015 1425   CREATININE 1.11 12/28/2023 1431   CREATININE 0.90 03/17/2015 1425   CALCIUM 8.9 12/28/2023 1431   CALCIUM 8.5 (L) 03/17/2015 1425   PROT 6.7 12/28/2023 1431   PROT 6.7 03/17/2015 1425   ALBUMIN  3.7 12/28/2023 1431   ALBUMIN 3.7 03/17/2015 1425   AST 25 12/28/2023 1431   ALT 21 12/28/2023 1431   ALT 23 03/17/2015 1425   ALKPHOS 73 12/28/2023 1431   ALKPHOS 44 03/17/2015 1425   BILITOT 0.6 12/28/2023 1431   GFRNONAA >60 12/28/2023 1431   GFRNONAA >60 03/17/2015 1425   GFRAA >60 08/02/2020 1322   GFRAA >60 03/17/2015 1425    No  results found for: SPEP, UPEP  Lab Results  Component Value Date   WBC 7.3 12/28/2023   NEUTROABS 5.1 12/28/2023   HGB 12.5 (L) 12/28/2023   HCT 37.1 (L) 12/28/2023   MCV 99.7 12/28/2023   PLT 315 12/28/2023      Chemistry      Component Value Date/Time   NA 131 (L) 12/28/2023 1431   NA 131 (L) 03/17/2015 1425   K 3.9 12/28/2023 1431   K 3.9 03/17/2015 1425   CL 99 12/28/2023 1431   CL 101 03/17/2015 1425   CO2 26 12/28/2023 1431   CO2 26 03/17/2015 1425   BUN 18 12/28/2023 1431   BUN 17 03/17/2015 1425   CREATININE 1.11 12/28/2023 1431   CREATININE 0.90 03/17/2015 1425      Component Value Date/Time   CALCIUM 8.9 12/28/2023 1431   CALCIUM 8.5 (L) 03/17/2015 1425   ALKPHOS 73 12/28/2023 1431   ALKPHOS 44 03/17/2015 1425   AST 25 12/28/2023 1431   ALT 21 12/28/2023 1431   ALT 23 03/17/2015 1425   BILITOT 0.6 12/28/2023 1431       ASSESSMENT & PLAN:   Idiopathic thrombocytopenic purpura (HCC) #Chronic ITP currently on  Promacta  25 mg. On promacta  Monday/Thursdays given insurance issues/costs. Continue cellcept  500 mg a AM. Currently  Platelets 340;   # Hemolytic anemia hemoglobin -12.7; iron studies;ferritin;  LDH normal. Continue CellCept  500 milligrams a AM- stable.   # Hypocalcemia- 8.8; continue ca+vit D once a day. stable.   # Hx of falls- ? Etiology. S/p evaluation with Maureen-  stable.   # Insomnia: Hold off any medications given risk of falls;  stable.   # Prostatism: Elevated PSA [[facility]; Urology]- currently monitored. Defer to Urology- stable.   # DISPOSITION: # labs q 4 weeks- cbc;  # follow up in 4  months/labs-cbc/cmp/LDH; haptoglobin; reticulocyte count- Dr.B       Cindy JONELLE Joe, MD 12/28/2023 3:32 PM

## 2023-12-28 NOTE — Assessment & Plan Note (Addendum)
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 On promacta  Monday/Thursdays given insurance issues/costs. Continue cellcept  500 mg a AM. Currently  Platelets 340;   # Hemolytic anemia hemoglobin -12.7; iron studies;ferritin;  LDH normal. Continue CellCept  500 milligrams a AM- stable.   # Hypocalcemia- 8.8; continue ca+vit D once a day. stable.   # Hx of falls- ? Etiology. S/p evaluation with Maureen-  stable.   # Insomnia: Hold off any medications given risk of falls;  stable.   # Prostatism: Elevated PSA [[facility]; Urology]- currently monitored. Defer to Urology- stable.   # DISPOSITION: # labs q 4 weeks- cbc;  # follow up in 4 months/labs-cbc/cmp/LDH; haptoglobin; reticulocyte count- Dr.B

## 2023-12-30 LAB — HAPTOGLOBIN: Haptoglobin: 253 mg/dL (ref 38–329)

## 2023-12-31 DIAGNOSIS — M6281 Muscle weakness (generalized): Secondary | ICD-10-CM | POA: Diagnosis not present

## 2023-12-31 DIAGNOSIS — R2681 Unsteadiness on feet: Secondary | ICD-10-CM | POA: Diagnosis not present

## 2024-01-03 DIAGNOSIS — M6281 Muscle weakness (generalized): Secondary | ICD-10-CM | POA: Diagnosis not present

## 2024-01-03 DIAGNOSIS — R2681 Unsteadiness on feet: Secondary | ICD-10-CM | POA: Diagnosis not present

## 2024-01-28 ENCOUNTER — Inpatient Hospital Stay: Payer: PPO | Attending: Internal Medicine

## 2024-01-28 DIAGNOSIS — D693 Immune thrombocytopenic purpura: Secondary | ICD-10-CM | POA: Insufficient documentation

## 2024-01-28 LAB — CBC WITH DIFFERENTIAL (CANCER CENTER ONLY)
Abs Immature Granulocytes: 0.03 10*3/uL (ref 0.00–0.07)
Basophils Absolute: 0 10*3/uL (ref 0.0–0.1)
Basophils Relative: 0 %
Eosinophils Absolute: 0.2 10*3/uL (ref 0.0–0.5)
Eosinophils Relative: 2 %
HCT: 38.6 % — ABNORMAL LOW (ref 39.0–52.0)
Hemoglobin: 12.9 g/dL — ABNORMAL LOW (ref 13.0–17.0)
Immature Granulocytes: 0 %
Lymphocytes Relative: 16 %
Lymphs Abs: 1.3 10*3/uL (ref 0.7–4.0)
MCH: 33.9 pg (ref 26.0–34.0)
MCHC: 33.4 g/dL (ref 30.0–36.0)
MCV: 101.6 fL — ABNORMAL HIGH (ref 80.0–100.0)
Monocytes Absolute: 0.6 10*3/uL (ref 0.1–1.0)
Monocytes Relative: 8 %
Neutro Abs: 5.7 10*3/uL (ref 1.7–7.7)
Neutrophils Relative %: 74 %
Platelet Count: 309 10*3/uL (ref 150–400)
RBC: 3.8 MIL/uL — ABNORMAL LOW (ref 4.22–5.81)
RDW: 13.5 % (ref 11.5–15.5)
WBC Count: 7.8 10*3/uL (ref 4.0–10.5)
nRBC: 0 % (ref 0.0–0.2)

## 2024-02-12 DIAGNOSIS — R55 Syncope and collapse: Secondary | ICD-10-CM | POA: Diagnosis not present

## 2024-02-12 DIAGNOSIS — Z23 Encounter for immunization: Secondary | ICD-10-CM | POA: Diagnosis not present

## 2024-02-12 DIAGNOSIS — I10 Essential (primary) hypertension: Secondary | ICD-10-CM | POA: Diagnosis not present

## 2024-02-12 DIAGNOSIS — E7800 Pure hypercholesterolemia, unspecified: Secondary | ICD-10-CM | POA: Diagnosis not present

## 2024-02-22 ENCOUNTER — Emergency Department

## 2024-02-22 ENCOUNTER — Emergency Department
Admission: EM | Admit: 2024-02-22 | Discharge: 2024-02-22 | Disposition: A | Discharge status: Home / Self Care | Attending: Student in an Organized Health Care Education/Training Program | Admitting: Student in an Organized Health Care Education/Training Program

## 2024-02-22 DIAGNOSIS — S51812A Laceration without foreign body of left forearm, initial encounter: Secondary | ICD-10-CM | POA: Diagnosis not present

## 2024-02-22 DIAGNOSIS — I6782 Cerebral ischemia: Secondary | ICD-10-CM | POA: Diagnosis not present

## 2024-02-22 DIAGNOSIS — W01198A Fall on same level from slipping, tripping and stumbling with subsequent striking against other object, initial encounter: Secondary | ICD-10-CM | POA: Diagnosis not present

## 2024-02-22 DIAGNOSIS — S4992XA Unspecified injury of left shoulder and upper arm, initial encounter: Secondary | ICD-10-CM | POA: Diagnosis present

## 2024-02-22 DIAGNOSIS — S41112A Laceration without foreign body of left upper arm, initial encounter: Secondary | ICD-10-CM | POA: Diagnosis not present

## 2024-02-22 DIAGNOSIS — Z043 Encounter for examination and observation following other accident: Secondary | ICD-10-CM | POA: Diagnosis not present

## 2024-02-22 DIAGNOSIS — S0990XA Unspecified injury of head, initial encounter: Secondary | ICD-10-CM | POA: Diagnosis not present

## 2024-02-22 NOTE — ED Notes (Signed)
 Wife of pt refused CT scan for pt and stated she is more important.

## 2024-02-22 NOTE — ED Provider Notes (Signed)
 [facility] AT [facility] Provider Note   CSN: [id number] Arrival date & time: 02/22/24  8295     History  Chief Complaint  Patient presents with   Karna Dupes. is a 89 y.o. male presents with family members for evaluation of a fall.  Patient was getting out of a car and lost his balance, stumbled and hit his left arm, suffered a mild skin tear.  Also wife states he hit his head but did not lose consciousness.  Patient without headache.  He has been ambulatory from the scene as well as here in the emergency department and denies any other injuries to his body.  Tetanus is up-to-date  HPI     Home Medications Prior to Admission medications   Medication Sig Start Date End Date Taking? Authorizing Provider  acetaminophen (TYLENOL) 500 MG tablet Take 500 mg by mouth every 6 (six) hours as needed for mild pain. Reported on 01/24/2016    [provider]  Cholecalciferol (VITAMIN D3) 250 MCG (10000 UT) capsule Take 10,000 Units by mouth daily.    [provider]  citalopram (CELEXA) 10 MG tablet Take 1 tablet by mouth daily. 11/02/22   [provider]  eltrombopag (PROMACTA) 25 MG tablet TAKE 1 TABLET (25 MG TOTAL) BY MOUTH EVERY OTHER DAY. TAKE ON AN EMPTY STOMACH, 1 HOUR BEFORE A MEAL OR 2 HOURS AFTER. 10/08/23   Earna Coder, MD  lisinopril (PRINIVIL,ZESTRIL) 5 MG tablet Take 10 mg by mouth daily.  Patient not taking: Reported on 12/28/2023 01/12/17   [provider]  meclizine (ANTIVERT) 25 MG tablet Take 1 tablet by mouth as needed for dizziness. 12/05/18   [provider]  mycophenolate (CELLCEPT) 500 MG tablet Take 1 tablet (500 mg total) by mouth daily. 12/13/23   Earna Coder, MD  omeprazole (PRILOSEC) 40 MG capsule TAKE 1 CAPSULE BY MOUTH DAILY USUALLY 30 MINUTES BEFORE BREAKFAST 09/28/17   Earna Coder, MD  polyethylene glycol (MIRALAX / GLYCOLAX) packet Take 17 g by mouth  daily as needed for mild constipation. 12/29/15   Earna Coder, MD  pravastatin (PRAVACHOL) 20 MG tablet Take 20 mg by mouth at bedtime.     [provider]  propranolol (INDERAL) 20 MG tablet Take 1 tablet by mouth 2 (two) times daily. 11/06/23 11/05/24  [provider]      Allergies    Cephalexin and Hydrocodone-acetaminophen    Review of Systems   Review of Systems  Physical Exam Updated Vital Signs BP (!) 150/80   Pulse 61   Temp 98 F (36.7 C) (Oral)   Resp 20   SpO2 98%  Physical Exam Constitutional:      Appearance: He is well-developed.  HENT:     Head: Normocephalic and atraumatic.     Nose: No congestion or rhinorrhea.     Mouth/Throat:     Pharynx: No oropharyngeal exudate or posterior oropharyngeal erythema.  Eyes:     Extraocular Movements: Extraocular movements intact.     Conjunctiva/sclera: Conjunctivae normal.     Pupils: Pupils are equal, round, and reactive to light.  Cardiovascular:     Rate and Rhythm: Normal rate.  Pulmonary:     Effort: Pulmonary effort is normal. No respiratory distress.  Musculoskeletal:        General: Normal range of motion.     Cervical back: Normal range of motion.     Comments: Left upper  extremity with minor 2 x 2 cm skin tear x 2.  Superficial, no active bleeding.  Skin reapproximated over tissue well with no major tissue/skin loss.  No hematoma.  No tenderness to palpation.  Wounds are clean.  Normal neck range of motion.  No spinous process tenderness.  Skin:    General: Skin is warm.     Findings: No rash.  Neurological:     General: No focal deficit present.     Mental Status: He is alert and oriented to person, place, and time.     Cranial Nerves: No cranial nerve deficit.     Motor: No weakness.     Gait: Gait normal.  Psychiatric:        Mood and Affect: Mood normal.        Behavior: Behavior normal.        Thought Content: Thought content normal.     ED Results / Procedures /  Treatments   Labs (all labs ordered are listed, but only abnormal results are displayed) Labs Reviewed - No data to display  EKG None  Radiology CT Head Wo Contrast Result Date: 02/22/2024 CLINICAL DATA:  Fall EXAM: CT HEAD WITHOUT CONTRAST TECHNIQUE: Contiguous axial images were obtained from the base of the skull through the vertex without intravenous contrast. RADIATION DOSE REDUCTION: This exam was performed according to the departmental dose-optimization program which includes automated exposure control, adjustment of the mA and/or kV according to patient size and/or use of iterative reconstruction technique. COMPARISON:  Head CT 06/19/2023.  MRI brain 01/06/2008. FINDINGS: Brain: No evidence of acute infarction, hemorrhage, hydrocephalus, extra-axial collection or mass lesion/mass effect. There is mild diffuse atrophy. There is moderate patchy periventricular and deep white matter hypodensity, unchanged. Vascular: Atherosclerotic calcifications are present within the cavernous internal carotid arteries. Skull: Normal. Negative for fracture or focal lesion. Sinuses/Orbits: Paranasal sinuses are clear. There are postsurgical changes in the right lobe. 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 Other: None. IMPRESSION: 1. No acute intracranial process. 2. Stable mild diffuse atrophy and moderate chronic small vessel ischemic change. Electronically Signed   By: Darliss Cheney M.D.   On: 02/22/2024 20:32    Procedures .Laceration Repair  Date/Time: 02/22/2024 9:27 PM  Performed by: Evon Slack, PA-C Authorized by: Evon Slack, PA-C   Consent:    Consent obtained:  Verbal   Consent given by:  Patient Anesthesia:    Anesthesia method:  None Laceration details:    Location:  Shoulder/arm   Shoulder/arm location:  L upper arm   Length (cm):  4 Pre-procedure details:    Preparation:  Patient was prepped and draped in usual sterile fashion Exploration:    Wound  exploration: wound explored through full range of motion     Wound extent: no foreign body, no signs of injury and no tendon damage     Contaminated: no   Treatment:    Area cleansed with:  Povidone-iodine and saline   Amount of cleaning:  Standard   Irrigation method:  Pressure wash   Visualized foreign bodies/material removed: no   Skin repair:    Repair method:  Tissue adhesive Approximation:    Approximation:  Close Repair type:    Repair type:  Simple Post-procedure details:    Dressing:  Open (no dressing)     Medications Ordered in ED Medications - No data to display  ED Course/ Medical Decision Making/ A&P  Medical Decision Making Amount and/or Complexity of Data Reviewed Radiology: ordered.  89 year old male with fall, minor head injury.  No complaints of headache LOC nausea or vomiting.  CT of the head negative.  He appears well with no neurological deficits.  He suffered a small abrasion/skin tear to his left upper arm.  Wound was thoroughly irrigated cleansed and repaired with Dermabond.  Patient tolerated procedure well.  He is educated on wound care and signs and symptoms return to ER for.  Tetanus is up-to-date.  Patient will follow-up as needed. Final Clinical Impression(s) / ED Diagnoses Final diagnoses:  Injury of head, initial encounter  Skin tear of left upper arm without complication, initial encounter    Rx / DC Orders ED Discharge Orders     None         Ronnette Juniper 02/22/24 2128    Willy Eddy, MD 02/22/24 2239

## 2024-02-22 NOTE — Discharge Instructions (Signed)
 Dermabond was applied over your skin tear.  You may shower and get this we.  You may allow Dermabond to fall off on its own.  Return to the ER for any severe headaches nausea vomiting vision changes or any worsening symptoms or any urgent changes in health

## 2024-02-22 NOTE — ED Triage Notes (Addendum)
 Pt to ED via POV from home. Pt reports had trouble getting out of his friends car and fell getting out of car. Wife reports pt did hit his head. Pt has laceration to left arm. No blood thinner. NO LOC

## 2024-02-25 ENCOUNTER — Inpatient Hospital Stay: Payer: PPO | Attending: Internal Medicine

## 2024-02-25 DIAGNOSIS — D693 Immune thrombocytopenic purpura: Secondary | ICD-10-CM | POA: Insufficient documentation

## 2024-02-25 LAB — CBC WITH DIFFERENTIAL (CANCER CENTER ONLY)
Abs Immature Granulocytes: 0.04 10*3/uL (ref 0.00–0.07)
Basophils Absolute: 0 10*3/uL (ref 0.0–0.1)
Basophils Relative: 1 %
Eosinophils Absolute: 0.1 10*3/uL (ref 0.0–0.5)
Eosinophils Relative: 1 %
HCT: 36 % — ABNORMAL LOW (ref 39.0–52.0)
Hemoglobin: 12.1 g/dL — ABNORMAL LOW (ref 13.0–17.0)
Immature Granulocytes: 1 %
Lymphocytes Relative: 17 %
Lymphs Abs: 1.5 10*3/uL (ref 0.7–4.0)
MCH: 34.3 pg — ABNORMAL HIGH (ref 26.0–34.0)
MCHC: 33.6 g/dL (ref 30.0–36.0)
MCV: 102 fL — ABNORMAL HIGH (ref 80.0–100.0)
Monocytes Absolute: 0.9 10*3/uL (ref 0.1–1.0)
Monocytes Relative: 10 %
Neutro Abs: 6.2 10*3/uL (ref 1.7–7.7)
Neutrophils Relative %: 70 %
Platelet Count: 283 10*3/uL (ref 150–400)
RBC: 3.53 MIL/uL — ABNORMAL LOW (ref 4.22–5.81)
RDW: 13.4 % (ref 11.5–15.5)
WBC Count: 8.8 10*3/uL (ref 4.0–10.5)
nRBC: 0 % (ref 0.0–0.2)

## 2024-03-24 ENCOUNTER — Inpatient Hospital Stay: Payer: PPO | Attending: Internal Medicine

## 2024-03-24 DIAGNOSIS — D693 Immune thrombocytopenic purpura: Secondary | ICD-10-CM | POA: Diagnosis not present

## 2024-03-24 LAB — CBC WITH DIFFERENTIAL (CANCER CENTER ONLY)
Abs Immature Granulocytes: 0.03 10*3/uL (ref 0.00–0.07)
Basophils Absolute: 0 10*3/uL (ref 0.0–0.1)
Basophils Relative: 1 %
Eosinophils Absolute: 0.1 10*3/uL (ref 0.0–0.5)
Eosinophils Relative: 2 %
HCT: 37.2 % — ABNORMAL LOW (ref 39.0–52.0)
Hemoglobin: 12.6 g/dL — ABNORMAL LOW (ref 13.0–17.0)
Immature Granulocytes: 0 %
Lymphocytes Relative: 20 %
Lymphs Abs: 1.5 10*3/uL (ref 0.7–4.0)
MCH: 33.9 pg (ref 26.0–34.0)
MCHC: 33.9 g/dL (ref 30.0–36.0)
MCV: 100 fL (ref 80.0–100.0)
Monocytes Absolute: 0.7 10*3/uL (ref 0.1–1.0)
Monocytes Relative: 9 %
Neutro Abs: 5 10*3/uL (ref 1.7–7.7)
Neutrophils Relative %: 68 %
Platelet Count: 313 10*3/uL (ref 150–400)
RBC: 3.72 MIL/uL — ABNORMAL LOW (ref 4.22–5.81)
RDW: 13 % (ref 11.5–15.5)
WBC Count: 7.3 10*3/uL (ref 4.0–10.5)
nRBC: 0 % (ref 0.0–0.2)

## 2024-04-28 ENCOUNTER — Telehealth: Payer: Self-pay | Admitting: Internal Medicine

## 2024-04-28 ENCOUNTER — Inpatient Hospital Stay: Payer: PPO | Attending: Internal Medicine

## 2024-04-28 ENCOUNTER — Encounter: Payer: Self-pay | Admitting: Internal Medicine

## 2024-04-28 ENCOUNTER — Other Ambulatory Visit: Payer: Self-pay | Admitting: *Deleted

## 2024-04-28 ENCOUNTER — Inpatient Hospital Stay: Payer: PPO | Admitting: Internal Medicine

## 2024-04-28 VITALS — BP 125/71 | HR 58 | Temp 98.4°F | Resp 17 | Ht 67.0 in | Wt 169.0 lb

## 2024-04-28 DIAGNOSIS — Z79899 Other long term (current) drug therapy: Secondary | ICD-10-CM | POA: Diagnosis not present

## 2024-04-28 DIAGNOSIS — D591 Autoimmune hemolytic anemia, unspecified: Secondary | ICD-10-CM | POA: Insufficient documentation

## 2024-04-28 DIAGNOSIS — D693 Immune thrombocytopenic purpura: Secondary | ICD-10-CM | POA: Insufficient documentation

## 2024-04-28 DIAGNOSIS — F0390 Unspecified dementia without behavioral disturbance: Secondary | ICD-10-CM | POA: Insufficient documentation

## 2024-04-28 DIAGNOSIS — D590 Drug-induced autoimmune hemolytic anemia: Secondary | ICD-10-CM

## 2024-04-28 LAB — CBC WITH DIFFERENTIAL (CANCER CENTER ONLY)
Abs Immature Granulocytes: 0.02 10*3/uL (ref 0.00–0.07)
Basophils Absolute: 0 10*3/uL (ref 0.0–0.1)
Basophils Relative: 1 %
Eosinophils Absolute: 0.1 10*3/uL (ref 0.0–0.5)
Eosinophils Relative: 1 %
HCT: 37.2 % — ABNORMAL LOW (ref 39.0–52.0)
Hemoglobin: 12.5 g/dL — ABNORMAL LOW (ref 13.0–17.0)
Immature Granulocytes: 0 %
Lymphocytes Relative: 18 %
Lymphs Abs: 1.3 10*3/uL (ref 0.7–4.0)
MCH: 33.6 pg (ref 26.0–34.0)
MCHC: 33.6 g/dL (ref 30.0–36.0)
MCV: 100 fL (ref 80.0–100.0)
Monocytes Absolute: 0.6 10*3/uL (ref 0.1–1.0)
Monocytes Relative: 9 %
Neutro Abs: 5.1 10*3/uL (ref 1.7–7.7)
Neutrophils Relative %: 71 %
Platelet Count: 270 10*3/uL (ref 150–400)
RBC: 3.72 MIL/uL — ABNORMAL LOW (ref 4.22–5.81)
RDW: 13.4 % (ref 11.5–15.5)
WBC Count: 7.2 10*3/uL (ref 4.0–10.5)
nRBC: 0 % (ref 0.0–0.2)

## 2024-04-28 LAB — CMP (CANCER CENTER ONLY)
ALT: 18 U/L (ref 0–44)
AST: 22 U/L (ref 15–41)
Albumin: 3.4 g/dL — ABNORMAL LOW (ref 3.5–5.0)
Alkaline Phosphatase: 86 U/L (ref 38–126)
Anion gap: 7 (ref 5–15)
BUN: 18 mg/dL (ref 8–23)
CO2: 25 mmol/L (ref 22–32)
Calcium: 8.6 mg/dL — ABNORMAL LOW (ref 8.9–10.3)
Chloride: 102 mmol/L (ref 98–111)
Creatinine: 0.92 mg/dL (ref 0.61–1.24)
GFR, Estimated: >60 mL/min (ref >60)
Glucose, Bld: 98 mg/dL (ref 70–99)
Potassium: 4.1 mmol/L (ref 3.5–5.1)
Sodium: 134 mmol/L — ABNORMAL LOW (ref 135–145)
Total Bilirubin: 0.8 mg/dL (ref 0.0–1.2)
Total Protein: 6.9 g/dL (ref 6.5–8.1)

## 2024-04-28 LAB — RETIC PANEL
Immature Retic Fract: 8.6 % (ref 2.3–15.9)
RBC.: 3.68 MIL/uL — ABNORMAL LOW (ref 4.22–5.81)
Retic Count, Absolute: 47.8 10*3/uL (ref 19.0–186.0)
Retic Ct Pct: 1.3 % (ref 0.4–3.1)
Reticulocyte Hemoglobin: 36.3 pg (ref >27.9)

## 2024-04-28 LAB — LACTATE DEHYDROGENASE: LDH: 148 U/L (ref 98–192)

## 2024-04-28 NOTE — Telephone Encounter (Signed)
 labs q 4 weeks- cbc;  # follow up in 4 months/labs-cbc/cmp/LDH; haptoglobin; reticulocyte count- Dr.B   Per chat- appointments scheduled as requested

## 2024-04-28 NOTE — Progress Notes (Signed)
 Patient here for oncology follow-up appointment, concerns of confusion and off balance

## 2024-04-28 NOTE — Assessment & Plan Note (Signed)
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 On promacta  Monday/Thursdays given insurance issues/costs. Continue cellcept  500 mg a AM. 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 Continue CellCept  500 milligrams a AM- stable.   # Hypocalcemia- 8.8; continue ca+vit D once a day. stable.   # Hx of falls- ? Etiology. S/p evaluation with Maureen-  stable.   # Dementia- slow progressive- defer to PCP.   # Prostatism: Elevated PSA [[facility]; Urology]- rising- 24- ? Hormone shot- currently monitored. Defer to Urology- stable.   # DISPOSITION: # labs q 4 weeks- cbc;  # follow up in 4 months/labs-cbc/cmp/LDH; haptoglobin; reticulocyte count- Dr.B

## 2024-04-28 NOTE — Progress Notes (Signed)
 [facility] OFFICE PROGRESS NOTE  Patient Care Team: Nestor Banter, MD as PCP - General (Family Medicine) Gwyn Leos, MD as Consulting Physician (Oncology)   SUMMARY OF ONCOLOGIC HISTORY:  #FEB 2016-  ITP  s/p Prednisone ; OCT 2016- Relapse of ITP- Prednisone ; JAN 13th- START RITUXAN  q W x4 [finished Feb 8th]; March 10th 2017- N-plate q W- good response; Dec 2018- promacta  25 mg qOD.   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   # Hx of nose bleeds  INTERVAL HISTORY: Walking independently.  Accompanied by his wife.  A very pleasant 89 year old male patient with a history of autoimmune hemolytic anemia/ITP [Patient currently taking 1 promacta  twice a week.] is here for follow-up.    Patient here for oncology follow-up appointment, concerns of confusion and off balance.   No lightheadedness or dizziness. Appetite is good. Denies any pain. No bleeding. No worsening fatigue.  Complains of joint pains.   Review of Systems  Constitutional:  Negative for chills, diaphoresis, fever, malaise/fatigue and weight loss.  HENT:  Negative for nosebleeds and sore throat.   Eyes:  Negative for double vision.  Respiratory:  Negative for cough, hemoptysis, sputum production, shortness of breath and wheezing.   Cardiovascular:  Negative for chest pain, palpitations, orthopnea and leg swelling.  Gastrointestinal:  Negative for abdominal pain, blood in stool, constipation, diarrhea, heartburn, melena, nausea and vomiting.  Genitourinary:  Negative for dysuria, frequency and urgency.  Musculoskeletal:  Positive for back pain and joint pain.  Skin: Negative.  Negative for itching and rash.  Neurological:  Negative for dizziness, tingling, focal weakness, weakness and headaches.  Endo/Heme/Allergies:  Does not bruise/bleed easily.  Psychiatric/Behavioral:  Negative for depression. The patient is not nervous/anxious  and does not have insomnia.     PAST MEDICAL HISTORY :  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   Leukocytosis    Prostate cancer (HCC)    Shingles     PAST SURGICAL HISTORY :   Past Surgical History:  Procedure Laterality Date   artificial left eye     EYE SURGERY     HERNIA REPAIR     JOINT REPLACEMENT     PROSTATE SURGERY      FAMILY HISTORY :   Family History  Problem Relation Age of Onset   Cancer Mother    Multiple sclerosis Father     SOCIAL HISTORY:   Social History   Tobacco Use   Smoking status: Never   Smokeless tobacco: Never  Vaping Use   Vaping status: Never Used  Substance Use Topics   Alcohol  use: Yes   Drug use: No    ALLERGIES:  is allergic to cephalexin  and hydrocodone-acetaminophen .  MEDICATIONS:  Current Outpatient Medications  Medication Sig Dispense Refill   acetaminophen  (TYLENOL ) 500 MG tablet Take 500 mg by mouth every 6 (six) hours as needed for mild pain. Reported on 01/24/2016     Cholecalciferol (VITAMIN D3) 250 MCG (10000 UT) capsule Take 10,000 Units by mouth daily.     citalopram  (CELEXA ) 10 MG tablet Take 1 tablet by mouth daily.     eltrombopag  (PROMACTA ) 25 MG tablet TAKE 1 TABLET (25 MG TOTAL) BY MOUTH EVERY OTHER DAY. TAKE ON AN EMPTY STOMACH,  1 HOUR BEFORE A MEAL OR 2 HOURS AFTER. 30 tablet 4   lisinopril  (PRINIVIL ,ZESTRIL ) 5 MG tablet Take 10 mg by mouth daily.     meclizine (ANTIVERT) 25 MG tablet Take 1 tablet by mouth as needed for dizziness.     mycophenolate  (CELLCEPT ) 500 MG tablet Take 1 tablet (500 mg total) by mouth daily. 30 tablet 3   omeprazole  (PRILOSEC) 40 MG capsule TAKE 1 CAPSULE BY MOUTH DAILY USUALLY 30 MINUTES BEFORE BREAKFAST 90 capsule 2   polyethylene glycol (MIRALAX  / GLYCOLAX ) packet Take 17 g by mouth daily as needed for  mild constipation. 14 each 0   pravastatin  (PRAVACHOL ) 20 MG tablet Take 20 mg by mouth at bedtime.      propranolol (INDERAL) 20 MG tablet Take 1 tablet by mouth 2 (two) times daily.     dorzolamide (TRUSOPT) 2 % ophthalmic solution SMARTSIG:In Eye(s)     latanoprost  (XALATAN ) 0.005 % ophthalmic solution SMARTSIG:In Eye(s)     No current facility-administered medications for this visit.    PHYSICAL EXAMINATION:   BP 125/71 (Patient Position: Sitting)   Pulse (!) 58   Temp 98.4 F (36.9 C) (Tympanic)   Resp 17   Ht 5\' 7"  (1.702 m)   Wt 169 lb (76.7 kg)   SpO2 98%   BMI 26.47 kg/m   Filed Weights   04/28/24 1329  Weight: 169 lb (76.7 kg)      Physical Exam HENT:     Head: Normocephalic and atraumatic.     Mouth/Throat:     Pharynx: No oropharyngeal exudate.  Eyes:     Pupils: Pupils are equal, round, and reactive to light.  Cardiovascular:     Rate and Rhythm: Normal rate and regular rhythm.  Pulmonary:     Effort: No respiratory distress.     Breath sounds: No wheezing.  Abdominal:     General: Bowel sounds are normal. There is no distension.     Palpations: Abdomen is soft. There is no mass.     Tenderness: There is no abdominal tenderness. There is no guarding or rebound.  Musculoskeletal:        General: No tenderness. Normal range of motion.     Cervical back: Normal range of motion and neck supple.  Skin:    General: Skin is warm.  Neurological:     Mental Status: He is alert and oriented to person, place, and time.  Psychiatric:        Mood and Affect: Affect normal.      LABORATORY DATA:  I have reviewed the data as listed    Component Value Date/Time   NA 134 (L) 04/28/2024 1311   NA 131 (L) 03/17/2015 1425   K 4.1 04/28/2024 1311   K 3.9 03/17/2015 1425   CL 102 04/28/2024 1311   CL 101 03/17/2015 1425   CO2 25 04/28/2024 1311   CO2 26 03/17/2015 1425   GLUCOSE 98 04/28/2024 1311   GLUCOSE 161 (H) 03/17/2015 1425   BUN 18 04/28/2024  1311   BUN 17 03/17/2015 1425   CREATININE 0.92 04/28/2024 1311   CREATININE 0.90 03/17/2015 1425   CALCIUM 8.6 (L) 04/28/2024 1311   CALCIUM 8.5 (L) 03/17/2015 1425   PROT 6.9 04/28/2024 1311   PROT 6.7 03/17/2015 1425   ALBUMIN 3.4 (L) 04/28/2024 1311   ALBUMIN 3.7 03/17/2015 1425   AST 22 04/28/2024 1311   ALT 18 04/28/2024 1311   ALT 23 03/17/2015 1425   ALKPHOS  86 04/28/2024 1311   ALKPHOS 44 03/17/2015 1425   BILITOT 0.8 04/28/2024 1311   GFRNONAA >60 04/28/2024 1311   GFRNONAA >60 03/17/2015 1425   GFRAA >60 08/02/2020 1322   GFRAA >60 03/17/2015 1425    No results found for: "SPEP", "UPEP"  Lab Results  Component Value Date   WBC 7.2 04/28/2024   NEUTROABS 5.1 04/28/2024   HGB 12.5 (L) 04/28/2024   HCT 37.2 (L) 04/28/2024   MCV 100.0 04/28/2024   PLT 270 04/28/2024      Chemistry      Component Value Date/Time   NA 134 (L) 04/28/2024 1311   NA 131 (L) 03/17/2015 1425   K 4.1 04/28/2024 1311   K 3.9 03/17/2015 1425   CL 102 04/28/2024 1311   CL 101 03/17/2015 1425   CO2 25 04/28/2024 1311   CO2 26 03/17/2015 1425   BUN 18 04/28/2024 1311   BUN 17 03/17/2015 1425   CREATININE 0.92 04/28/2024 1311   CREATININE 0.90 03/17/2015 1425      Component Value Date/Time   CALCIUM 8.6 (L) 04/28/2024 1311   CALCIUM 8.5 (L) 03/17/2015 1425   ALKPHOS 86 04/28/2024 1311   ALKPHOS 44 03/17/2015 1425   AST 22 04/28/2024 1311   ALT 18 04/28/2024 1311   ALT 23 03/17/2015 1425   BILITOT 0.8 04/28/2024 1311       ASSESSMENT & PLAN:   Idiopathic thrombocytopenic purpura (HCC) #Chronic ITP currently on  Promacta  25 mg. On promacta  Monday/Thursdays given insurance issues/costs. Continue cellcept  500 mg a AM. 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 Continue CellCept  500 milligrams a AM- stable.   # Hypocalcemia- 8.8; continue ca+vit D once a day. stable.   # Hx of falls- ? Etiology. S/p evaluation with  Maureen-  stable.   # Dementia- slow progressive- defer to PCP.   # Prostatism: Elevated PSA [[facility]; Urology]- rising- 24- ? Hormone shot- currently monitored. Defer to Urology- stable.   # DISPOSITION: # labs q 4 weeks- cbc;  # follow up in 4 months/labs-cbc/cmp/LDH; haptoglobin; reticulocyte count- Dr.B        Gwyn Leos, MD 04/28/2024 2:40 PM

## 2024-04-29 DIAGNOSIS — E7800 Pure hypercholesterolemia, unspecified: Secondary | ICD-10-CM | POA: Diagnosis not present

## 2024-04-29 DIAGNOSIS — Z79899 Other long term (current) drug therapy: Secondary | ICD-10-CM | POA: Diagnosis not present

## 2024-04-29 DIAGNOSIS — Z125 Encounter for screening for malignant neoplasm of prostate: Secondary | ICD-10-CM | POA: Diagnosis not present

## 2024-04-29 DIAGNOSIS — R972 Elevated prostate specific antigen [PSA]: Secondary | ICD-10-CM | POA: Diagnosis not present

## 2024-04-29 DIAGNOSIS — C61 Malignant neoplasm of prostate: Secondary | ICD-10-CM | POA: Diagnosis not present

## 2024-04-29 LAB — HAPTOGLOBIN: Haptoglobin: 271 mg/dL (ref 38–329)

## 2024-05-06 DIAGNOSIS — D693 Immune thrombocytopenic purpura: Secondary | ICD-10-CM | POA: Diagnosis not present

## 2024-05-06 DIAGNOSIS — I10 Essential (primary) hypertension: Secondary | ICD-10-CM | POA: Diagnosis not present

## 2024-05-06 DIAGNOSIS — C61 Malignant neoplasm of prostate: Secondary | ICD-10-CM | POA: Diagnosis not present

## 2024-05-06 DIAGNOSIS — E7800 Pure hypercholesterolemia, unspecified: Secondary | ICD-10-CM | POA: Diagnosis not present

## 2024-05-06 DIAGNOSIS — R972 Elevated prostate specific antigen [PSA]: Secondary | ICD-10-CM | POA: Diagnosis not present

## 2024-05-06 DIAGNOSIS — Z79899 Other long term (current) drug therapy: Secondary | ICD-10-CM | POA: Diagnosis not present

## 2024-05-06 DIAGNOSIS — Z125 Encounter for screening for malignant neoplasm of prostate: Secondary | ICD-10-CM | POA: Diagnosis not present

## 2024-05-06 DIAGNOSIS — R2681 Unsteadiness on feet: Secondary | ICD-10-CM | POA: Diagnosis not present

## 2024-05-24 ENCOUNTER — Observation Stay: Admitting: Anesthesiology

## 2024-05-24 ENCOUNTER — Encounter
Admission: EM | Disposition: A | Discharge status: Home Health | Payer: Self-pay | Source: Home / Self Care | Attending: Osteopathic Medicine

## 2024-05-24 ENCOUNTER — Emergency Department

## 2024-05-24 ENCOUNTER — Other Ambulatory Visit: Payer: Self-pay

## 2024-05-24 ENCOUNTER — Inpatient Hospital Stay
Admission: EM | Admit: 2024-05-24 | Discharge: 2024-05-31 | DRG: 513 | Disposition: A | Discharge status: Home Health | Attending: Osteopathic Medicine | Admitting: Osteopathic Medicine

## 2024-05-24 DIAGNOSIS — E7800 Pure hypercholesterolemia, unspecified: Secondary | ICD-10-CM | POA: Diagnosis present

## 2024-05-24 DIAGNOSIS — Z885 Allergy status to narcotic agent status: Secondary | ICD-10-CM

## 2024-05-24 DIAGNOSIS — Z79899 Other long term (current) drug therapy: Secondary | ICD-10-CM

## 2024-05-24 DIAGNOSIS — Z888 Allergy status to other drugs, medicaments and biological substances status: Secondary | ICD-10-CM

## 2024-05-24 DIAGNOSIS — C61 Malignant neoplasm of prostate: Secondary | ICD-10-CM | POA: Diagnosis present

## 2024-05-24 DIAGNOSIS — B029 Zoster without complications: Secondary | ICD-10-CM | POA: Diagnosis present

## 2024-05-24 DIAGNOSIS — Z79624 Long term (current) use of inhibitors of nucleotide synthesis: Secondary | ICD-10-CM

## 2024-05-24 DIAGNOSIS — D590 Drug-induced autoimmune hemolytic anemia: Secondary | ICD-10-CM | POA: Diagnosis present

## 2024-05-24 DIAGNOSIS — M65949 Unspecified synovitis and tenosynovitis, unspecified hand: Principal | ICD-10-CM | POA: Diagnosis present

## 2024-05-24 DIAGNOSIS — L03011 Cellulitis of right finger: Secondary | ICD-10-CM | POA: Diagnosis not present

## 2024-05-24 DIAGNOSIS — M7989 Other specified soft tissue disorders: Secondary | ICD-10-CM | POA: Diagnosis not present

## 2024-05-24 DIAGNOSIS — M65941 Unspecified synovitis and tenosynovitis, right hand: Principal | ICD-10-CM | POA: Diagnosis present

## 2024-05-24 DIAGNOSIS — G4700 Insomnia, unspecified: Secondary | ICD-10-CM | POA: Diagnosis present

## 2024-05-24 DIAGNOSIS — M79671 Pain in right foot: Secondary | ICD-10-CM | POA: Diagnosis present

## 2024-05-24 DIAGNOSIS — K5900 Constipation, unspecified: Secondary | ICD-10-CM | POA: Diagnosis present

## 2024-05-24 DIAGNOSIS — Z820 Family history of epilepsy and other diseases of the nervous system: Secondary | ICD-10-CM

## 2024-05-24 DIAGNOSIS — S52511D Displaced fracture of right radial styloid process, subsequent encounter for closed fracture with routine healing: Secondary | ICD-10-CM | POA: Diagnosis not present

## 2024-05-24 DIAGNOSIS — R262 Difficulty in walking, not elsewhere classified: Secondary | ICD-10-CM | POA: Diagnosis present

## 2024-05-24 DIAGNOSIS — Z862 Personal history of diseases of the blood and blood-forming organs and certain disorders involving the immune mechanism: Secondary | ICD-10-CM | POA: Diagnosis not present

## 2024-05-24 DIAGNOSIS — R5381 Other malaise: Secondary | ICD-10-CM | POA: Diagnosis present

## 2024-05-24 DIAGNOSIS — M65841 Other synovitis and tenosynovitis, right hand: Secondary | ICD-10-CM | POA: Diagnosis not present

## 2024-05-24 DIAGNOSIS — L03113 Cellulitis of right upper limb: Secondary | ICD-10-CM | POA: Diagnosis present

## 2024-05-24 DIAGNOSIS — Z8546 Personal history of malignant neoplasm of prostate: Secondary | ICD-10-CM | POA: Diagnosis not present

## 2024-05-24 DIAGNOSIS — Z970 Presence of artificial eye: Secondary | ICD-10-CM

## 2024-05-24 DIAGNOSIS — E871 Hypo-osmolality and hyponatremia: Secondary | ICD-10-CM | POA: Diagnosis present

## 2024-05-24 DIAGNOSIS — I10 Essential (primary) hypertension: Secondary | ICD-10-CM | POA: Diagnosis present

## 2024-05-24 DIAGNOSIS — M1811 Unilateral primary osteoarthritis of first carpometacarpal joint, right hand: Secondary | ICD-10-CM | POA: Diagnosis not present

## 2024-05-24 DIAGNOSIS — M19031 Primary osteoarthritis, right wrist: Secondary | ICD-10-CM | POA: Diagnosis not present

## 2024-05-24 DIAGNOSIS — D72829 Elevated white blood cell count, unspecified: Secondary | ICD-10-CM | POA: Diagnosis not present

## 2024-05-24 DIAGNOSIS — I700 Atherosclerosis of aorta: Secondary | ICD-10-CM | POA: Diagnosis not present

## 2024-05-24 DIAGNOSIS — R918 Other nonspecific abnormal finding of lung field: Secondary | ICD-10-CM | POA: Diagnosis not present

## 2024-05-24 HISTORY — PX: FLEXOR TENDON REPAIR: SHX6501

## 2024-05-24 HISTORY — DX: Unspecified synovitis and tenosynovitis, unspecified hand: M65.949

## 2024-05-24 LAB — CBC WITH DIFFERENTIAL/PLATELET
Abs Immature Granulocytes: 0.04 K/uL (ref 0.00–0.07)
Basophils Absolute: 0 K/uL (ref 0.0–0.1)
Basophils Relative: 0 %
Eosinophils Absolute: 0.1 K/uL (ref 0.0–0.5)
Eosinophils Relative: 1 %
HCT: 36 % — ABNORMAL LOW (ref 39.0–52.0)
Hemoglobin: 12.3 g/dL — ABNORMAL LOW (ref 13.0–17.0)
Immature Granulocytes: 0 %
Lymphocytes Relative: 12 %
Lymphs Abs: 1.3 K/uL (ref 0.7–4.0)
MCH: 34.2 pg — ABNORMAL HIGH (ref 26.0–34.0)
MCHC: 34.2 g/dL (ref 30.0–36.0)
MCV: 100 fL (ref 80.0–100.0)
Monocytes Absolute: 1 K/uL (ref 0.1–1.0)
Monocytes Relative: 9 %
Neutro Abs: 8.5 K/uL — ABNORMAL HIGH (ref 1.7–7.7)
Neutrophils Relative %: 78 %
Platelets: 276 K/uL (ref 150–400)
RBC: 3.6 MIL/uL — ABNORMAL LOW (ref 4.22–5.81)
RDW: 13.3 % (ref 11.5–15.5)
WBC: 10.9 K/uL — ABNORMAL HIGH (ref 4.0–10.5)
nRBC: 0 % (ref 0.0–0.2)

## 2024-05-24 LAB — BASIC METABOLIC PANEL WITH GFR
Anion gap: 6 (ref 5–15)
BUN: 15 mg/dL (ref 8–23)
CO2: 25 mmol/L (ref 22–32)
Calcium: 8.9 mg/dL (ref 8.9–10.3)
Chloride: 104 mmol/L (ref 98–111)
Creatinine, Ser: 0.9 mg/dL (ref 0.61–1.24)
GFR, Estimated: >60 mL/min (ref >60)
Glucose, Bld: 98 mg/dL (ref 70–99)
Potassium: 3.9 mmol/L (ref 3.5–5.1)
Sodium: 135 mmol/L (ref 135–145)

## 2024-05-24 LAB — SEDIMENTATION RATE: Sed Rate: 68 mm/h — ABNORMAL HIGH (ref 0–20)

## 2024-05-24 LAB — C-REACTIVE PROTEIN: CRP: 1.6 mg/dL — ABNORMAL HIGH (ref <1.0)

## 2024-05-24 SURGERY — REPAIR, TENDON, FLEXOR
Anesthesia: General | Site: Little Finger | Laterality: Right

## 2024-05-24 MED ORDER — PROPOFOL 10 MG/ML IV BOLUS
INTRAVENOUS | Status: AC
  Filled 2024-05-24: qty 20

## 2024-05-24 MED ORDER — FENTANYL CITRATE (PF) 100 MCG/2ML IJ SOLN
INTRAMUSCULAR | Status: DC | PRN
  Administered 2024-05-24: 50 ug via INTRAVENOUS

## 2024-05-24 MED ORDER — DORZOLAMIDE HCL 2 % OP SOLN
1.0000 [drp] | Freq: Two times a day (BID) | OPHTHALMIC | Status: DC
  Administered 2024-05-24 – 2024-05-31 (×14): 1 [drp] via OPHTHALMIC
  Filled 2024-05-24: qty 10

## 2024-05-24 MED ORDER — AMPICILLIN-SULBACTAM SODIUM 3 (2-1) G IJ SOLR
3.0000 g | Freq: Once | INTRAMUSCULAR | Status: AC
  Administered 2024-05-24: 3 g via INTRAVENOUS
  Filled 2024-05-24: qty 8

## 2024-05-24 MED ORDER — CITALOPRAM HYDROBROMIDE 10 MG PO TABS
20.0000 mg | ORAL_TABLET | Freq: Every day | ORAL | Status: DC
  Administered 2024-05-25 – 2024-05-31 (×7): 20 mg via ORAL
  Filled 2024-05-24 (×7): qty 2

## 2024-05-24 MED ORDER — GLYCOPYRROLATE 0.2 MG/ML IJ SOLN
INTRAMUSCULAR | Status: DC | PRN
  Administered 2024-05-24: .2 mg via INTRAVENOUS

## 2024-05-24 MED ORDER — SODIUM CHLORIDE 0.9 % IV SOLN
2.0000 g | Freq: Two times a day (BID) | INTRAVENOUS | Status: DC
  Administered 2024-05-24 – 2024-05-30 (×12): 2 g via INTRAVENOUS
  Filled 2024-05-24 (×12): qty 12.5

## 2024-05-24 MED ORDER — DEXAMETHASONE SODIUM PHOSPHATE 10 MG/ML IJ SOLN
INTRAMUSCULAR | Status: DC | PRN
  Administered 2024-05-24: 10 mg via INTRAVENOUS

## 2024-05-24 MED ORDER — VANCOMYCIN HCL 1750 MG/350ML IV SOLN
1750.0000 mg | Freq: Once | INTRAVENOUS | Status: AC
  Administered 2024-05-24: 1750 mg via INTRAVENOUS
  Filled 2024-05-24: qty 350

## 2024-05-24 MED ORDER — VANCOMYCIN HCL IN DEXTROSE 1-5 GM/200ML-% IV SOLN
1000.0000 mg | Freq: Once | INTRAVENOUS | Status: DC
  Filled 2024-05-24: qty 200

## 2024-05-24 MED ORDER — LIDOCAINE HCL (PF) 2 % IJ SOLN
INTRAMUSCULAR | Status: AC
Start: 2024-05-24 — End: 2024-05-24
  Filled 2024-05-24: qty 5

## 2024-05-24 MED ORDER — BUPIVACAINE HCL (PF) 0.5 % IJ SOLN
INTRAMUSCULAR | Status: DC | PRN
  Administered 2024-05-24: 3 mL

## 2024-05-24 MED ORDER — CEFAZOLIN SODIUM-DEXTROSE 2-4 GM/100ML-% IV SOLN: 2.0000 g | Freq: Once | INTRAVENOUS | Status: DC

## 2024-05-24 MED ORDER — BUPIVACAINE HCL (PF) 0.5 % IJ SOLN
INTRAMUSCULAR | Status: AC
  Filled 2024-05-24: qty 30

## 2024-05-24 MED ORDER — SUCCINYLCHOLINE CHLORIDE 200 MG/10ML IV SOSY
PREFILLED_SYRINGE | INTRAVENOUS | Status: DC | PRN
  Administered 2024-05-24: 80 mg via INTRAVENOUS

## 2024-05-24 MED ORDER — ONDANSETRON HCL 4 MG/2ML IJ SOLN
INTRAMUSCULAR | Status: DC | PRN
  Administered 2024-05-24: 4 mg via INTRAVENOUS

## 2024-05-24 MED ORDER — LIDOCAINE HCL (CARDIAC) PF 100 MG/5ML IV SOSY
PREFILLED_SYRINGE | INTRAVENOUS | Status: DC | PRN
  Administered 2024-05-24: 80 mg via INTRAVENOUS

## 2024-05-24 MED ORDER — 0.9 % SODIUM CHLORIDE (POUR BTL) OPTIME
TOPICAL | Status: DC | PRN
  Administered 2024-05-24: 500 mL

## 2024-05-24 MED ORDER — SUCCINYLCHOLINE CHLORIDE 200 MG/10ML IV SOSY
PREFILLED_SYRINGE | INTRAVENOUS | Status: AC
  Filled 2024-05-24: qty 10

## 2024-05-24 MED ORDER — PRAVASTATIN SODIUM 20 MG PO TABS
20.0000 mg | ORAL_TABLET | Freq: Every day | ORAL | Status: DC
  Administered 2024-05-24 – 2024-05-30 (×7): 20 mg via ORAL
  Filled 2024-05-24 (×7): qty 1

## 2024-05-24 MED ORDER — POLYETHYLENE GLYCOL 3350 17 G PO PACK
17.0000 g | PACK | Freq: Every day | ORAL | Status: DC | PRN
Start: 2024-05-24 — End: 2024-05-29
  Administered 2024-05-26 – 2024-05-27 (×2): 17 g via ORAL
  Filled 2024-05-24 (×2): qty 1

## 2024-05-24 MED ORDER — FENTANYL CITRATE (PF) 100 MCG/2ML IJ SOLN
INTRAMUSCULAR | Status: AC
  Filled 2024-05-24: qty 2

## 2024-05-24 MED ORDER — FENTANYL CITRATE (PF) 100 MCG/2ML IJ SOLN: 25.0000 ug | INTRAMUSCULAR | Status: DC | PRN

## 2024-05-24 MED ORDER — SEVOFLURANE IN SOLN
RESPIRATORY_TRACT | Status: AC
Start: 2024-05-24 — End: 2024-05-24
  Filled 2024-05-24: qty 250

## 2024-05-24 MED ORDER — OXYCODONE HCL 5 MG/5ML PO SOLN: 5.0000 mg | Freq: Once | ORAL | Status: DC | PRN

## 2024-05-24 MED ORDER — PANTOPRAZOLE SODIUM 40 MG PO TBEC
40.0000 mg | DELAYED_RELEASE_TABLET | Freq: Every day | ORAL | Status: DC
  Administered 2024-05-24 – 2024-05-31 (×8): 40 mg via ORAL
  Filled 2024-05-24 (×8): qty 1

## 2024-05-24 MED ORDER — OXYCODONE HCL 5 MG PO TABS: 5.0000 mg | ORAL_TABLET | Freq: Once | ORAL | Status: DC | PRN

## 2024-05-24 MED ORDER — SODIUM CHLORIDE 0.9 % IV SOLN
2.0000 g | Freq: Once | INTRAVENOUS | Status: DC
  Filled 2024-05-24: qty 12.5

## 2024-05-24 MED ORDER — EPHEDRINE SULFATE-NACL 50-0.9 MG/10ML-% IV SOSY
PREFILLED_SYRINGE | INTRAVENOUS | Status: DC | PRN
  Administered 2024-05-24 (×3): 10 mg via INTRAVENOUS

## 2024-05-24 MED ORDER — LACTATED RINGERS IV SOLN: INTRAVENOUS | Status: DC | PRN

## 2024-05-24 MED ORDER — ACETAMINOPHEN 650 MG RE SUPP: 650.0000 mg | Freq: Four times a day (QID) | RECTAL | Status: DC | PRN

## 2024-05-24 MED ORDER — ACETAMINOPHEN 325 MG PO TABS
650.0000 mg | ORAL_TABLET | Freq: Four times a day (QID) | ORAL | Status: DC | PRN
  Administered 2024-05-27 – 2024-05-29 (×6): 650 mg via ORAL
  Filled 2024-05-24 (×6): qty 2

## 2024-05-24 MED ORDER — BISACODYL 10 MG RE SUPP
10.0000 mg | Freq: Every day | RECTAL | Status: DC | PRN
  Administered 2024-05-27: 10 mg via RECTAL
  Filled 2024-05-24 (×2): qty 1

## 2024-05-24 MED ORDER — PROPRANOLOL HCL 20 MG PO TABS
40.0000 mg | ORAL_TABLET | Freq: Two times a day (BID) | ORAL | Status: DC
  Administered 2024-05-24 – 2024-05-31 (×12): 40 mg via ORAL
  Filled 2024-05-24 (×13): qty 2

## 2024-05-24 MED ORDER — DEXMEDETOMIDINE HCL IN NACL 80 MCG/20ML IV SOLN
INTRAVENOUS | Status: DC | PRN
  Administered 2024-05-24: 8 ug via INTRAVENOUS

## 2024-05-24 MED ORDER — VANCOMYCIN HCL 1500 MG/300ML IV SOLN
1500.0000 mg | Freq: Once | INTRAVENOUS | Status: DC
  Filled 2024-05-24: qty 300

## 2024-05-24 MED ORDER — PROPOFOL 10 MG/ML IV BOLUS
INTRAVENOUS | Status: DC | PRN
  Administered 2024-05-24: 30 mg via INTRAVENOUS
  Administered 2024-05-24: 200 mg via INTRAVENOUS

## 2024-05-24 MED ORDER — CEFAZOLIN SODIUM-DEXTROSE 2-4 GM/100ML-% IV SOLN
INTRAVENOUS | Status: AC
Start: 2024-05-24 — End: 2024-05-24
  Filled 2024-05-24: qty 100

## 2024-05-24 MED ORDER — LATANOPROST 0.005 % OP SOLN
1.0000 [drp] | Freq: Every day | OPHTHALMIC | Status: DC
  Administered 2024-05-24 – 2024-05-30 (×7): 1 [drp] via OPHTHALMIC
  Filled 2024-05-24: qty 2.5

## 2024-05-24 MED ORDER — MORPHINE SULFATE (PF) 2 MG/ML IV SOLN: 0.5000 mg | INTRAVENOUS | Status: DC | PRN

## 2024-05-24 MED ORDER — VANCOMYCIN HCL 1250 MG/250ML IV SOLN
1250.0000 mg | INTRAVENOUS | Status: DC
  Administered 2024-05-25 – 2024-05-27 (×3): 1250 mg via INTRAVENOUS
  Filled 2024-05-24 (×3): qty 250

## 2024-05-24 MED ORDER — LISINOPRIL 20 MG PO TABS
20.0000 mg | ORAL_TABLET | Freq: Every day | ORAL | Status: DC
  Administered 2024-05-25 – 2024-05-31 (×7): 20 mg via ORAL
  Filled 2024-05-24 (×8): qty 1

## 2024-05-24 SURGICAL SUPPLY — 23 items
APPLICATOR CHLORAPREP 10.5 ORG (MISCELLANEOUS) ×1 IMPLANT
BNDG COHESIVE 4X5 TAN STRL LF (GAUZE/BANDAGES/DRESSINGS) ×1 IMPLANT
BNDG ELASTIC 2INX 5YD STR LF (GAUZE/BANDAGES/DRESSINGS) IMPLANT
DRAPE U-SHAPE 47X51 STRL (DRAPES) ×1 IMPLANT
ELECTRODE REM PT RTRN 9FT ADLT (ELECTROSURGICAL) ×1 IMPLANT
GAUZE SPONGE 4X4 12PLY STRL (GAUZE/BANDAGES/DRESSINGS) ×1 IMPLANT
GAUZE STRETCH 2X75IN STRL (MISCELLANEOUS) IMPLANT
GAUZE XEROFORM 1X8 LF (GAUZE/BANDAGES/DRESSINGS) IMPLANT
GLOVE BIOGEL PI IND STRL 8 (GLOVE) ×1 IMPLANT
GLOVE SURG SYN 7.5 E (GLOVE) ×1 IMPLANT
GLOVE SURG SYN 7.5 PF PI (GLOVE) ×1 IMPLANT
GOWN SRG LRG LVL 4 IMPRV REINF (GOWNS) ×1 IMPLANT
GOWN SRG XL LONG LVL 3 NONREIN (GOWNS) ×1 IMPLANT
KIT TURNOVER KIT A (KITS) ×1 IMPLANT
MANIFOLD NEPTUNE II (INSTRUMENTS) ×1 IMPLANT
NS IRRIG 500ML POUR BTL (IV SOLUTION) ×1 IMPLANT
PACK EXTREMITY ARMC (MISCELLANEOUS) ×1 IMPLANT
PENCIL SMOKE EVACUATOR (MISCELLANEOUS) ×1 IMPLANT
STOCKINETTE M/LG 89821 (MISCELLANEOUS) ×1 IMPLANT
SUTURE PROLEN 4-0 RB1 30XMFL (SUTURE) IMPLANT
SWAB CULTURE AMIES ANAERIB BLU (MISCELLANEOUS) ×2 IMPLANT
TRAP FLUID SMOKE EVACUATOR (MISCELLANEOUS) ×1 IMPLANT
WATER STERILE IRR 500ML POUR (IV SOLUTION) ×1 IMPLANT

## 2024-05-24 NOTE — Anesthesia Postprocedure Evaluation (Signed)
 Anesthesia Post Note  Patient: Truth Barot.  Procedure(s) Performed: RIGHT FIFTH FINGER TENDON FLEXOR IRRIGATION AND DEBRIDEMENT (Right: Little Finger)  Patient location during evaluation: PACU Anesthesia Type: General Level of consciousness: awake and alert Pain management: pain level controlled Vital Signs Assessment: post-procedure vital signs reviewed and stable Respiratory status: spontaneous breathing, nonlabored ventilation, respiratory function stable and patient connected to nasal cannula oxygen Cardiovascular status: blood pressure returned to baseline and stable Postop Assessment: no apparent nausea or vomiting Anesthetic complications: no   No notable events documented.   Last Vitals:  Vitals:   05/24/24 1830 05/24/24 1845  BP: (!) 145/77 (!) 169/90  Pulse: 67 80  Resp: 15 20  Temp: (!) 36.4 C   SpO2: 95% 95%    Last Pain:  Vitals:   05/24/24 1830  TempSrc:   PainSc: 0-No pain                 Lendia LITTIE Mae

## 2024-05-24 NOTE — Consult Note (Addendum)
 ED Pharmacy Antibiotic Sign Off An antibiotic consult was received from an ED provider for Vancomycin  per pharmacy dosing for cellulitis. A chart review was completed to assess appropriateness.   The following one time order(s) were placed:  Vancomycin  1750 mg IV x 1 dose  Further antibiotic and/or antibiotic pharmacy consults should be ordered by the admitting provider if indicated.   Thank you for allowing pharmacy to be a part of this patient's care.   Dail Lerew Rodriguez-Guzman PharmD, BCPS 05/24/2024 12:11 PM

## 2024-05-24 NOTE — Assessment & Plan Note (Signed)
 The patient's platelets today are 276. The patient has previously taken cellcept  and promacta  for this problem, but lately, according to the patient's wife, the patient's platelets have been stable and he hardly takes them anymore. His rheumatologist is considering taking him off of them altogether. They will be held during this admission due to infection.

## 2024-05-24 NOTE — Anesthesia Procedure Notes (Signed)
 Procedure Name: Intubation Date/Time: 05/24/2024 4:43 PM  Performed by: Tod Handing, CRNAPre-anesthesia Checklist: Patient identified, Patient being monitored, Timeout performed, Emergency Drugs available and Suction available Patient Re-evaluated:Patient Re-evaluated prior to induction Oxygen Delivery Method: Circle system utilized Preoxygenation: Pre-oxygenation with 100% oxygen Induction Type: IV induction and Rapid sequence Ventilation: Mask ventilation without difficulty Laryngoscope Size: Mac, 4 and McGrath Grade View: Grade I Tube type: Oral Tube size: 7.5 mm Number of attempts: 1 Airway Equipment and Method: Stylet Placement Confirmation: ETT inserted through vocal cords under direct vision, positive ETCO2 and breath sounds checked- equal and bilateral Secured at: 23 cm Tube secured with: Tape Dental Injury: Teeth and Oropharynx as per pre-operative assessment

## 2024-05-24 NOTE — ED Triage Notes (Signed)
 To ED from home with wife for R little finger swelling since he woke up yesterday morning. Finger is obviously swollen. No obvious insect bites or injuries.

## 2024-05-24 NOTE — Transfer of Care (Signed)
 Immediate Anesthesia Transfer of Care Note  Patient: [name].  Procedure(s) Performed: Procedure(s): RIGHT FIFTH FINGER TENDON FLEXOR IRRIGATION AND DEBRIDEMENT (Right)  Patient Location: PACU  Anesthesia Type:General  Level of Consciousness: sedated  Airway & Oxygen Therapy: Patient Spontanous Breathing and Patient connected to face mask oxygen  Post-op Assessment: Report given to RN and Post -op Vital signs reviewed and stable  Post vital signs: Reviewed and stable  Last Vitals:  Vitals:   05/24/24 1116 05/24/24 1750  BP: 117/85 (!) 165/84  Pulse: 71 65  Resp: 20 16  Temp: 36.7 C   SpO2:  100%    Complications: No apparent anesthesia complications

## 2024-05-24 NOTE — ED Provider Notes (Signed)
 [facility] Provider Note    Event Date/Time   First MD Initiated Contact with Patient 05/24/24 1120     (approximate)   History   finger swelling   HPI  [name]. is a 89 y.o. male who presents today for evaluation of right finger pain and swelling.  Patient reports that this began the night before last night as just swelling and pain at the base of his fifth finger, palmar aspect.  However he reports that it worsened yesterday and today he had erythema and warmth throughout the finger.  He and his wife both report that it looks very different today than it did yesterday.  He went to urgent care where he was told to come to the emergency department for likely IV antibiotics.  Patient denies history of trauma, puncture wound.  He is not diabetic.  No fevers.  Reports that his pain is significantly worsened with palpation along the area and any attempted range of motion.  Patient is on CellCept .  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   Nasal bleeding 01/08/2016   Uncontrolled hypertension 01/08/2016   Idiopathic thrombocytopenic purpura (HCC) 09/01/2015   Amaurosis fugax of right eye 08/10/2015   Acute renal failure (HCC) 04/06/2015   History of ITP 03/22/2015   Syncope 12/21/2014   Pure hypercholesterolemia 08/27/2014   Biceps tendon tear 04/09/2014   Cancer of prostate w/low recurrence risk (T1-2a, Gleason<7 & PSA<10) (HCC) 07/30/2012   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   Serous retinal detachment, right eye 06/11/2012          Physical Exam   Triage Vital Signs: ED Triage Vitals  Encounter Vitals Group     BP 05/24/24 1116 117/85     Girls Systolic BP Percentile --      Girls Diastolic BP Percentile --      Boys Systolic BP  Percentile --      Boys Diastolic BP Percentile --      Pulse Rate 05/24/24 1116 71     Resp 05/24/24 1116 20     Temp 05/24/24 1116 98 F (36.7 C)     Temp Source 05/24/24 1116 Oral     SpO2 --      Weight 05/24/24 1115 167 lb (75.8 kg)     Height 05/24/24 1115 5' 7 (1.702 m)     Head Circumference --      Peak Flow --      Pain Score 05/24/24 1115 8     Pain Loc --      Pain Education --      Exclude from Growth Chart --     Most recent vital signs: Vitals:   05/24/24 1116  BP: 117/85  Pulse: 71  Resp: 20  Temp: 98 F (36.7 C)    Physical Exam Vitals and nursing note reviewed.  Constitutional:      General: Awake and alert. No acute distress.    Appearance: Normal appearance. The patient is normal weight.  HENT:     Head: Normocephalic and atraumatic.     Mouth: Mucous membranes are moist.  Eyes:     General: PERRL. Normal EOMs        Right eye: No discharge.        Left eye:  No discharge.     Conjunctiva/sclera: Conjunctivae normal.  Cardiovascular:     Rate and Rhythm: Normal rate and regular rhythm.     Pulses: Normal pulses.  Pulmonary:     Effort: Pulmonary effort is normal. No respiratory distress.     Breath sounds: Normal breath sounds.  Abdominal:     Abdomen is soft. There is no abdominal tenderness. No rebound or guarding. No distention. Musculoskeletal:        General: No swelling. Normal range of motion.     Cervical back: Normal range of motion and neck supple.  Right hand: Fifth finger with fusiform swelling with erythema and tenderness along the flexor tendon sheath to the MCP and into the palm of the hand just proximal to the MCP.  Pain with passive extension.  Holding finger in passive flexion. Skin:    General: Skin is warm and dry.     Capillary Refill: Capillary refill takes less than 2 seconds.     Findings: No rash.  Neurological:     Mental Status: The patient is awake and alert.      ED Results / Procedures / Treatments    Labs (all labs ordered are listed, but only abnormal results are displayed) Labs Reviewed  CBC WITH DIFFERENTIAL/PLATELET - Abnormal; Notable for the following components:      Result Value   WBC 10.9 (*)    RBC 3.60 (*)    Hemoglobin 12.3 (*)    HCT 36.0 (*)    MCH 34.2 (*)    Neutro Abs 8.5 (*)    All other components within normal limits  SEDIMENTATION RATE - Abnormal; Notable for the following components:   Sed Rate 68 (*)    All other components within normal limits  BASIC METABOLIC PANEL WITH GFR  C-REACTIVE PROTEIN     EKG     RADIOLOGY I independently reviewed and interpreted imaging and agree with radiologists findings.     PROCEDURES:  Critical Care performed:   Procedures   MEDICATIONS ORDERED IN ED: Medications  acetaminophen  (TYLENOL ) tablet 650 mg (has no administration in time range)    Or  acetaminophen  (TYLENOL ) suppository 650 mg (has no administration in time range)  morphine  (PF) 2 MG/ML injection 0.5 mg (has no administration in time range)  polyethylene glycol (MIRALAX  / GLYCOLAX ) packet 17 g (has no administration in time range)  bisacodyl  (DULCOLAX) suppository 10 mg (has no administration in time range)  0.9 % irrigation (POUR BTL) (500 mLs Irrigation Given 05/24/24 1552)  ceFAZolin  (ANCEF ) IVPB 2g/100 mL premix (has no administration in time range)  Ampicillin -Sulbactam (UNASYN ) 3 g in sodium chloride  0.9 % 100 mL IVPB (0 g Intravenous Stopped 05/24/24 1220)  vancomycin  (VANCOREADY) IVPB 1750 mg/350 mL (0 mg Intravenous Stopped 05/24/24 1440)     IMPRESSION / MDM / ASSESSMENT AND PLAN / ED COURSE  I reviewed the triage vital signs and the nursing notes.   Differential diagnosis includes, but is not limited to, flexor tenosynovitis, cellulitis, abscess, trauma.  Patient is awake and alert, hemodynamically stable and afebrile.  He has fusiform swelling of his right fifth finger.  With erythema and tenderness along the flexor tendon  sheath.  He is holding his finger in passive flexion and has pain with passive extension.  There is no history of puncture, no visible puncture wound.  Labs obtained reveal a leukocytosis to 10.9, and elevated ESR to 68.  CRP pending.  X-ray obtained reveals nonspecific soft tissue swelling  of the right finger without foreign body or bony destruction.  Given my suspicion for flexor tenosynovitis, patient was started on broad-spectrum antibiotics.  I consulted orthopedics, Dr. Tobie, who initially requested admission to the hospitalist service for continued IV antibiotics.  However, patient was reevaluated multiple times and the erythema and swelling seem to have worsened throughout his ER stay.  There is no crepitus or pain out of proportion to suggest a necrotizing infection.  I again discussed with Dr. Tobie who came to evaluate the patient and has agreed to bring the patient to the operating room.  I also consulted hospitalist for admission, and patient was accepted by Dr. Soledad.   Patient's presentation is most consistent with acute presentation with potential threat to life or bodily function.   Clinical Course as of 05/24/24 1636  Sat May 24, 2024  1248 Dr. Tobie paged [JP]  1321 Dr. Tobie has requested hospitalist admission and n.p.o. now, he will come into evaluate the patient [JP]    Clinical Course User Index [JP] Sahory Nordling E, PA-C     FINAL CLINICAL IMPRESSION(S) / ED DIAGNOSES   Final diagnoses:  Flexor tenosynovitis of finger     Rx / DC Orders   ED Discharge Orders     None        Note:  This document was prepared using Dragon voice recognition software and may include unintentional dictation errors.   Brian Kocourek E, PA-C 05/24/24 1636    [name], MD 06/02/24 [phone]

## 2024-05-24 NOTE — Assessment & Plan Note (Signed)
Left eye

## 2024-05-24 NOTE — Assessment & Plan Note (Signed)
 The patient's blood pressure at home is controlled with zestril  20 mg daily and propranolol  40 mg daily. This will be continued as inpatient.

## 2024-05-24 NOTE — Anesthesia Preprocedure Evaluation (Signed)
 Anesthesia Evaluation  Patient identified by MRN, date of birth, ID band Patient awake    Reviewed: Allergy & Precautions, NPO status , Patient's Chart, lab work & pertinent test results  History of Anesthesia Complications Negative for: history of anesthetic complications  Airway Mallampati: II  TM Distance: >3 FB Neck ROM: full    Dental no notable dental hx.    Pulmonary neg pulmonary ROS   Pulmonary exam normal        Cardiovascular hypertension, On Medications negative cardio ROS Normal cardiovascular exam     Neuro/Psych negative neurological ROS  negative psych ROS   GI/Hepatic negative GI ROS, Neg liver ROS,,,  Endo/Other  negative endocrine ROS    Renal/GU   negative genitourinary   Musculoskeletal   Abdominal   Peds  Hematology  (+) Blood dyscrasia, anemia ITP   Anesthesia Other Findings Past Medical History: No date: Autoimmune hemolytic anemia (HCC) No date: Dehydration No date: Detached retina No date: Hypercholesteremia No date: Hypertension No date: Insomnia 09/01/2015: ITP (idiopathic thrombocytopenic purpura) No date: Leukocytosis No date: Prostate cancer (HCC) No date: Shingles  Past Surgical History: No date: APPENDECTOMY No date: artificial left eye No date: EYE SURGERY No date: HERNIA REPAIR No date: JOINT REPLACEMENT No date: PROSTATE SURGERY  BMI    Body Mass Index: 26.16 kg/m      Reproductive/Obstetrics negative OB ROS                              Anesthesia Physical Anesthesia Plan  ASA: 2  Anesthesia Plan: General ETT   Post-op Pain Management: Toradol IV (intra-op)* and Ofirmev  IV (intra-op)*   Induction: Intravenous and Rapid sequence  PONV Risk Score and Plan: 2 and Ondansetron , Dexamethasone  and Treatment may vary due to age or medical condition  Airway Management Planned: Oral ETT  Additional Equipment:   Intra-op Plan:    Post-operative Plan: Extubation in OR  Informed Consent: I have reviewed the patients History and Physical, chart, labs and discussed the procedure including the risks, benefits and alternatives for the proposed anesthesia with the patient or authorized representative who has indicated his/her understanding and acceptance.     Dental Advisory Given  Plan Discussed with: Anesthesiologist, CRNA and Surgeon  Anesthesia Plan Comments: (Patient consented for risks of anesthesia including but not limited to:  - adverse reactions to medications - damage to eyes, teeth, lips or other oral mucosa - nerve damage due to positioning  - sore throat or hoarseness - Damage to heart, brain, nerves, lungs, other parts of body or loss of life  Patient voiced understanding and assent.)         Anesthesia Quick Evaluation

## 2024-05-24 NOTE — Progress Notes (Signed)
 Pharmacy Antibiotic Note  [name]. is a 89 y.o. male admitted on 05/24/2024 with cellulitis.  Pharmacy has been consulted for vancomycin  and cefepime  dosing.  Plan: Vancomycin  load of 1750 mg IV x1 given in ED Start vancomycin  1250 mg IV every 24 hours (eAUC 481.4, Scr 0.90, IBW used, Vd 0.72 L/kg) Start cefepime  2 g IV every 12 hours Monitor renal function, clinical status, culture data, and LOT  Height: 5' 7 (170.2 cm) Weight: 75.8 kg (167 lb) IBW/kg (Calculated) : 66.1  Temp (24hrs), Avg:97.6 F (36.4 C), Min:97 F (36.1 C), Max:98 F (36.7 C)  Recent Labs  Lab 05/24/24 1156  WBC 10.9*  CREATININE 0.90    Estimated Creatinine Clearance: 52 mL/min (by C-G formula based on SCr of 0.9 mg/dL).    Allergies  Allergen Reactions   Cephalexin  Diarrhea    C.diff   Hydrocodone-Acetaminophen  Nausea Only    Vomiting/dizziness    Antimicrobials this admission: cefepime  7/5 >>  vancomycin  7/5 >>   Dose adjustments this admission: N/A  Microbiology results: 7/5 BCx: pending  Thank you for involving pharmacy in this patient's care.   Damien Napoleon, PharmD Clinical Pharmacist 05/24/2024 9:21 PM

## 2024-05-24 NOTE — Assessment & Plan Note (Signed)
 Will continued pravachol  20 mg at bedtime.

## 2024-05-24 NOTE — H&P (Signed)
 ORTHOPAEDIC CONSULTATION  REQUESTING PHYSICIAN: [name], MD  Chief Complaint:   R hand pain  History of Present Illness: Patient presents with his wife at the bedside.  [name]. is a 89 y.o. right-handed male with a past medical history significant for ITP, HTN, HLD, and prostate cancer who presents with concern for right hand infection.  He states that 2 nights ago he noticed some pain and swelling about the right fifth finger on the volar aspect.  He was able to tolerate his symptoms yesterday.  He noted that yesterday evening, he had increased erythema, pain, and swelling about the fifth finger.  He woke up this morning and symptoms were significantly worsened.  Additionally, he states that over the past 2 hours that he has been here, the erythema and swelling has continued to worsen.  Pain continues to be located over the volar aspect of the fifth finger.  He denies any sort of penetrating injury to the hand or breaks in the skin.  Of note, he is on CellCept .  He denies history of diabetes.  He denies any fevers or chills.  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   Leukocytosis    Prostate cancer (HCC)    Shingles    Past Surgical History:  Procedure Laterality Date   APPENDECTOMY     artificial left eye     EYE SURGERY     HERNIA REPAIR     JOINT REPLACEMENT     PROSTATE SURGERY     Social History   Socioeconomic History   Marital status: Married    Spouse name: Not on file   Number of children: Not on file   Years of education: Not on file   Highest education level: Not on file  Occupational History   Occupation: retired  Tobacco Use   Smoking status: Never   Smokeless tobacco: Never  Vaping Use   Vaping  status: Never Used  Substance and Sexual Activity   Alcohol  use: Yes    Comment: occ   Drug use: No   Sexual activity: Not on file  Other Topics Concern   Not on file  Social History Narrative   Not on file   Social Drivers of Health   Financial Resource Strain: Patient Declined (11/06/2023)   Received from [facility]   Overall Financial Resource Strain (CARDIA)    Difficulty of Paying Living Expenses: Patient declined  Food Insecurity: Patient Declined (11/06/2023)   Received from [facility]   Hunger Vital Sign    Within the past 12 months, you worried that your food would run out before you got the money to buy more.: Patient declined    Within the past 12 months, the food you bought just didn't last and you didn't have money to get more.: Patient declined  Transportation Needs: Patient Declined (11/06/2023)   Received from [facility] - Transportation    In the past 12 months, has lack of transportation kept you from medical appointments or from getting medications?: Patient declined    Lack of Transportation (Non-Medical): Patient declined  Physical Activity: Not on file  Stress: Not on file  Social Connections: Not on file   Family History  Problem Relation Age of  Onset   Cancer Mother    Multiple sclerosis Father    Allergies  Allergen Reactions   Cephalexin  Diarrhea    C.diff   Hydrocodone-Acetaminophen  Nausea Only    Vomiting/dizziness   Prior to Admission medications   Medication Sig Start Date End Date Taking? Authorizing Provider  acetaminophen  (TYLENOL ) 500 MG tablet Take 500 mg by mouth every 6 (six) hours as needed for mild pain. Reported on 01/24/2016    [provider]  Cholecalciferol (VITAMIN D3) 250 MCG (10000 UT) capsule Take 10,000 Units by mouth daily.    [provider]  citalopram  (CELEXA ) 10 MG tablet Take 1 tablet by mouth daily. 11/02/22   [provider]  dorzolamide  (TRUSOPT ) 2 % ophthalmic solution SMARTSIG:In Eye(s)    [provider]  eltrombopag  (PROMACTA ) 25 MG tablet TAKE 1 TABLET (25 MG TOTAL) BY MOUTH EVERY OTHER DAY. TAKE ON AN EMPTY STOMACH, 1 HOUR BEFORE A MEAL OR 2 HOURS AFTER. 10/08/23   Brahmanday, Govinda R, MD  latanoprost  (XALATAN ) 0.005 % ophthalmic solution SMARTSIG:In Eye(s)    [provider]  lisinopril  (PRINIVIL ,ZESTRIL ) 5 MG tablet Take 10 mg by mouth daily. 01/12/17   [provider]  meclizine (ANTIVERT) 25 MG tablet Take 1 tablet by mouth as needed for dizziness. 12/05/18   [provider]  mycophenolate  (CELLCEPT ) 500 MG tablet Take 1 tablet (500 mg total) by mouth daily. 12/13/23   Brahmanday, Govinda R, MD  omeprazole  (PRILOSEC) 40 MG capsule TAKE 1 CAPSULE BY MOUTH DAILY USUALLY 30 MINUTES BEFORE BREAKFAST 09/28/17   Brahmanday, Govinda R, MD  polyethylene glycol (MIRALAX  / GLYCOLAX ) packet Take 17 g by mouth daily as needed for mild constipation. 12/29/15   Brahmanday, Govinda R, MD  pravastatin  (PRAVACHOL ) 20 MG tablet Take 20 mg by mouth at bedtime.     [provider]  propranolol  (INDERAL ) 20 MG tablet Take 1 tablet by mouth 2 (two) times daily. 11/06/23 11/05/24  [provider]   Recent Labs    05/24/24 1156  WBC 10.9*  HGB 12.3*  HCT 36.0*  PLT 276  K 3.9  CL 104  CO2 25  BUN 15  CREATININE 0.90  GLUCOSE 98  CALCIUM 8.9   DG Hand Complete Right Result Date: 05/24/2024 CLINICAL DATA:  Fifth finger swelling since awakening yesterday. EXAM: RIGHT HAND - COMPLETE 3+ VIEW COMPARISON:  Wrist radiographs 09/08/2021. FINDINGS: No evidence of acute fracture, dislocation or acute bone destruction. Incomplete healing of previously demonstrated fracture of the radial styloid. There are mild-to-moderate degenerative changes at the proximal interphalangeal joint of the small finger. Questionable erosion of the neck of the 5th proximal phalanx. Additional  degenerative changes within the wrist and at the 1st carpometacarpal joint. There are scattered capsular calcifications as well as TFCC calcification. Soft tissue swelling noted in the small finger without evidence of foreign body or soft tissue emphysema. IMPRESSION: 1. Nonspecific soft tissue swelling in the small finger without evidence of foreign body or acute bone destruction. 2. Degenerative changes at the proximal interphalangeal joint of the small finger with questionable erosion of the neck of the 5th proximal phalanx. 3. Incomplete healing of previously demonstrated fracture of the radial styloid. Electronically Signed   By: Elsie Perone M.D.   On: 05/24/2024 12:44     Positive ROS: All other systems have been reviewed and were otherwise negative with the exception of those mentioned in the HPI and as above.  Physical Exam: BP 117/85 (BP Location: Left  Arm)   Pulse 71   Temp 98 F (36.7 C) (Oral)   Resp 20   Ht 5' 7 (1.702 m)   Wt 75.8 kg   BMI 26.16 kg/m  General:  Alert, no acute distress Psychiatric:  Patient is competent for consent with normal mood and affect    Orthopedic Exam:  RUE: +ain/pin/u motor SILT r/u/m/ax +rad pulse RoM There is significant tenderness to palpation over fifth finger flexor tendon sheath.  Tenderness is localized to this region.  No tenderness to palpation about medial or ulnar aspects of the finger or dorsal aspect of the finger.  No pain about the distal tip of the finger or proximal to the A1 pulley of the fifth finger. There is significant erythema about the volar aspect of the fifth finger. There is fusiform swelling about the fifth finger. Fifth finger is resting in a flexed position, and there is severe pain with resisted finger extension.  Imaging:  As above: Soft tissue swelling about the fifth finger.  No fractures or dislocations.  Assessment/Plan: 89 y.o. male with clinical presentation concerning for right fifth finger  pyogenic flexor tenosynovitis. 1.  We discussed the clinical findings.  We discussed both nonsurgical and surgical management. After discussion of risks, benefits, and alternatives to surgery, the patient elected to proceed.  This would consist of right fifth finger flexor tendon sheath irrigation and debridement.  Given patient's acute worsening over the past few hours while the patient has been in the emergency department, we agreed to proceed with this in a more emergent fashion.  The patient has been n.p.o. since 9 AM, and given the emergent nature of the surgery we will plan to proceed with surgery now as opposed to waiting for a full 8 hours.  I have discussed this with the anesthesia team who was in agreement.  2.  Keep n.p.o. until OR.  3.  Patient will be admitted to the hospitalist team after the surgery.   Earnestine Blanch   05/24/2024 3:16 PM

## 2024-05-24 NOTE — Assessment & Plan Note (Signed)
 The patient states that he first noticed that the 5th digit of the right hand was painful on Wednesday night. It has progressively gotten worse until the pain was quite severe on 05/23/2024. X-ray and presentation were consistent with tenosynovitis and have ruled out osteomyelitis. Orthopedic hand surgery was consulted from the ED and will take the patient for surgery this afternoon.

## 2024-05-24 NOTE — Plan of Care (Signed)

## 2024-05-24 NOTE — H&P (Signed)
 History and Physical    Patient: [name]. FMW:[id number] DOB: September 01, 1932 DOA: 05/24/2024 DOS: the patient was seen and examined on 05/24/2024 PCP: [name], MD  Patient coming from: Home  Chief Complaint:  Chief Complaint  Patient presents with   finger swelling   HPI: Keith Baig. is a 89 y.o. male with medical history significant for  Review of Systems: As mentioned in the history of present illness. All other systems reviewed and are negative. 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   Leukocytosis    Prostate cancer (HCC)    Shingles    Past Surgical History:  Procedure Laterality Date   APPENDECTOMY     artificial left eye     EYE SURGERY     HERNIA REPAIR     JOINT REPLACEMENT     PROSTATE SURGERY     Social History:  reports that he has never smoked. He has never used smokeless tobacco. He reports current alcohol  use. He reports that he does not use drugs.  Allergies  Allergen Reactions   Cephalexin  Diarrhea    C.diff   Hydrocodone-Acetaminophen  Nausea Only    Vomiting/dizziness    Family History  Problem Relation Age of Onset   Cancer Mother    Multiple sclerosis Father     Prior to Admission medications   Medication Sig Start Date End Date Taking? Authorizing Provider  acetaminophen  (TYLENOL ) 500 MG tablet Take 500 mg by mouth every 6 (six) hours as needed for mild pain. Reported on 01/24/2016    [provider]  Cholecalciferol (VITAMIN D3) 250 MCG (10000 UT) capsule Take 10,000 Units by mouth daily.    [provider]  citalopram  (CELEXA ) 20 MG tablet Take 20 mg by mouth daily. 04/01/24   [provider]  dorzolamide  (TRUSOPT ) 2 % ophthalmic solution SMARTSIG:In Eye(s)    [provider]  eltrombopag  (PROMACTA ) 25 MG  tablet TAKE 1 TABLET (25 MG TOTAL) BY MOUTH EVERY OTHER DAY. TAKE ON AN EMPTY STOMACH, 1 HOUR BEFORE A MEAL OR 2 HOURS AFTER. 10/08/23   Brahmanday, Govinda R, MD  latanoprost  (XALATAN ) 0.005 % ophthalmic solution SMARTSIG:In Eye(s)    [provider]  lisinopril  (ZESTRIL ) 20 MG tablet Take 20 mg by mouth daily. 02/19/24 02/18/25  [provider]  mycophenolate  (CELLCEPT ) 500 MG tablet Take 1 tablet (500 mg total) by mouth daily. 12/13/23   Brahmanday, Govinda R, MD  omeprazole  (PRILOSEC) 40 MG capsule TAKE 1 CAPSULE BY MOUTH DAILY USUALLY 30 MINUTES BEFORE BREAKFAST 09/28/17   Brahmanday, Govinda R, MD  pravastatin  (PRAVACHOL ) 20 MG tablet Take 20 mg by mouth at bedtime.     [provider]  propranolol  (INDERAL ) 40 MG tablet Take 1 tablet by mouth 2 (two) times daily. 03/14/24   [provider]    Physical Exam: Vitals:   05/24/24 1800 05/24/24 1815 05/24/24 1830 05/24/24 1845  BP: (!) 153/81 (!) 166/83 (!) 145/77 (!) 169/90  Pulse: 66 65 67 80  Resp: 14 13 15 20   Temp:   (!) 97.5 F (36.4 C)   TempSrc:      SpO2: 100% 100% 95% 95%  Weight:      Height:       Exam:  Constitutional:  The patient is awake,  alert, and oriented x 3. No acute distress. Eyes:  pupils and irises appear normal Normal lids and conjunctivae Left eye is a prosthetic shell. ENMT:  grossly normal hearing  Lips appear normal external ears, nose appear normal Oropharynx: mucosa, tongue,posterior pharynx appear normal Neck:  neck appears normal, no masses, normal ROM, supple no thyromegaly Respiratory:  No increased work of breathing. No wheezes, rales, or rhonchi No tactile fremitus Cardiovascular:  Regular rate and rhythm No murmurs, ectopy, or gallups. No lateral PMI. No thrills. Abdomen:  Abdomen is soft, non-tender, non-distended No hernias, masses, or organomegaly Normoactive bowel sounds.  Musculoskeletal:  No cyanosis, clubbing, or edema Fifth digit of right  hand and palm of right hand are erythematous and swollen. The patient is unable to flexa nd extend his fist and fifth digit. It is warm to touch. Skin:  No rashes, lesions, ulcers palpation of skin: no induration or nodules Neurologic:  CN 2-12 intact Sensation all 4 extremities intact Psychiatric:  Mental status Mood, affect appropriate Orientation to person, place, time  judgment and insight appear intact  Data Reviewed:  Hand X-ray CBC CMP  Assessment and Plan: Tenosynovitis of finger and hand The patient states that he first noticed that the 5th digit of the right hand was painful on Wednesday night. It has progressively gotten worse until the pain was quite severe on 05/23/2024. X-ray and presentation were consistent with tenosynovitis and have ruled out osteomyelitis. Orthopedic hand surgery was consulted from the ED and will take the patient for surgery this afternoon.  Uncontrolled hypertension The patient's blood pressure at home is controlled with zestril  20 mg daily and propranolol  40 mg daily. This will be continued as inpatient.  Pure hypercholesterolemia Will continued pravachol  20 mg at bedtime.   Eye globe prosthesis Left eye.  History of ITP The patient's platelets today are 276. The patient has previously taken cellcept  and promacta  for this problem, but lately, according to the patient's wife, the patient's platelets have been stable and he hardly takes them anymore. His rheumatologist is considering taking him off of them altogether. They will be held during this admission due to infection.  I have seen and examined this patient myself. I have spent 62 minutes in his evaluation and admission.   Advance Care Planning:   Code Status: Full Code   Consults: Orthopedic surgery  Family Communication: Wife is at bedside.  Severity of Illness: The appropriate patient status for this patient is INPATIENT. Inpatient status is judged to be reasonable and necessary  in order to provide the required intensity of service to ensure the patient's safety. The patient's presenting symptoms, physical exam findings, and initial radiographic and laboratory data in the context of their chronic comorbidities is felt to place them at high risk for further clinical deterioration. Furthermore, it is not anticipated that the patient will be medically stable for discharge from the hospital within 2 midnights of admission.   * I certify that at the point of admission it is my clinical judgment that the patient will require inpatient hospital care spanning beyond 2 midnights from the point of admission due to high intensity of service, high risk for further deterioration and high frequency of surveillance required.*  Author: Osceola Depaz, DO 05/24/2024 7:53 PM  For on call review [email or web address].

## 2024-05-24 NOTE — Op Note (Signed)
 Operative Note    SURGERY DATE: 05/24/2024   PRE-OP DIAGNOSIS:  1. Right 5th finger flexor tenosynovitis   POST-OP DIAGNOSIS:  1. Right 5th finger flexor tenosynovitis   PROCEDURES:  1. Right 5th finger flexor tendon irrigation and debridement  SURGEON: Earnestine HILARIO Blanch, MD  ASSISTANTS: none   ANESTHESIA: Gen    ESTIMATED BLOOD LOSS: 10cc   TOTAL IV FLUIDS: per anesthesia  DRAIN: none   INDICATION(S):  [name]. is a 89 y.o. male with signs and symptoms consistent with flexor tenosynovitis of the affected finger. After discussion of risks, benefits, and alternatives to surgery, the patient elected to proceed.     OPERATIVE FINDINGS: flexor tenosynovitis of the R 5th finger   OPERATIVE REPORT:   I identified [name]. in the pre-operative holding area. Informed consent was obtained and the surgical site was marked. I reviewed the risks and benefits of the proposed surgical intervention and the patient wished to proceed. The patient was transferred to the operative suite and general anesthesia was administered. The patient was placed in the supine position.  Arm was placed on a hand table.  All down side pressure points were appropriately padded. The extremity was then prepped and draped in standard fashion. A time out was performed confirming the correct extremity, correct patient, and correct procedure. Preoperative antibiotics were administered.  First, a Bruner incision was made over the A1 pulley of the above finger.  Dissection was carried down to the flexor tendon sheath.  This was incised and there was return of exudative fluid.  Culture sample was obtained.  Next, a transverse incision at the level of the A5 pulley was made on the volar aspect of the finger. Dissection was carried down to the flexor tendon sheath.  This was also incised and there was return of mildly purulent material. This was also cultured.  Next, using a 16-gauge Angiocath in a proximal to  distal fashion, the flexor tendon sheath was thoroughly irrigated. This was repeated until there was consistent return of clear fluid from the distal incision.   Appropriate hemostasis was achieved.  Incisions were closed with 4-0 Prolene.  Wounds were covered with Xeroform and a soft, sterile dressing.  Patient was transferred to the PACU without notable complication.  POSTOPERATIVE PLAN: The patient will be admitted for IV antibiotics.  Follow-up culture results.

## 2024-05-25 ENCOUNTER — Encounter: Payer: Self-pay | Admitting: Orthopedic Surgery

## 2024-05-25 DIAGNOSIS — Z79899 Other long term (current) drug therapy: Secondary | ICD-10-CM | POA: Diagnosis not present

## 2024-05-25 DIAGNOSIS — D72829 Elevated white blood cell count, unspecified: Secondary | ICD-10-CM | POA: Diagnosis not present

## 2024-05-25 DIAGNOSIS — Z8546 Personal history of malignant neoplasm of prostate: Secondary | ICD-10-CM | POA: Diagnosis not present

## 2024-05-25 DIAGNOSIS — C61 Malignant neoplasm of prostate: Secondary | ICD-10-CM | POA: Diagnosis not present

## 2024-05-25 DIAGNOSIS — L03113 Cellulitis of right upper limb: Secondary | ICD-10-CM | POA: Diagnosis not present

## 2024-05-25 DIAGNOSIS — Z79624 Long term (current) use of inhibitors of nucleotide synthesis: Secondary | ICD-10-CM | POA: Diagnosis not present

## 2024-05-25 DIAGNOSIS — M79671 Pain in right foot: Secondary | ICD-10-CM | POA: Diagnosis not present

## 2024-05-25 DIAGNOSIS — I10 Essential (primary) hypertension: Secondary | ICD-10-CM | POA: Diagnosis not present

## 2024-05-25 DIAGNOSIS — M65949 Unspecified synovitis and tenosynovitis, unspecified hand: Secondary | ICD-10-CM | POA: Diagnosis not present

## 2024-05-25 DIAGNOSIS — B029 Zoster without complications: Secondary | ICD-10-CM | POA: Diagnosis not present

## 2024-05-25 DIAGNOSIS — M65941 Unspecified synovitis and tenosynovitis, right hand: Secondary | ICD-10-CM | POA: Diagnosis not present

## 2024-05-25 DIAGNOSIS — Z820 Family history of epilepsy and other diseases of the nervous system: Secondary | ICD-10-CM | POA: Diagnosis not present

## 2024-05-25 DIAGNOSIS — R262 Difficulty in walking, not elsewhere classified: Secondary | ICD-10-CM | POA: Diagnosis not present

## 2024-05-25 DIAGNOSIS — M65141 Other infective (teno)synovitis, right hand: Secondary | ICD-10-CM | POA: Diagnosis not present

## 2024-05-25 DIAGNOSIS — R69 Illness, unspecified: Secondary | ICD-10-CM | POA: Diagnosis not present

## 2024-05-25 DIAGNOSIS — R5381 Other malaise: Secondary | ICD-10-CM | POA: Diagnosis not present

## 2024-05-25 DIAGNOSIS — K5900 Constipation, unspecified: Secondary | ICD-10-CM | POA: Diagnosis not present

## 2024-05-25 DIAGNOSIS — J189 Pneumonia, unspecified organism: Secondary | ICD-10-CM | POA: Diagnosis not present

## 2024-05-25 DIAGNOSIS — Z888 Allergy status to other drugs, medicaments and biological substances status: Secondary | ICD-10-CM | POA: Diagnosis not present

## 2024-05-25 DIAGNOSIS — D590 Drug-induced autoimmune hemolytic anemia: Secondary | ICD-10-CM | POA: Diagnosis not present

## 2024-05-25 DIAGNOSIS — Z862 Personal history of diseases of the blood and blood-forming organs and certain disorders involving the immune mechanism: Secondary | ICD-10-CM | POA: Diagnosis not present

## 2024-05-25 DIAGNOSIS — Z970 Presence of artificial eye: Secondary | ICD-10-CM | POA: Diagnosis not present

## 2024-05-25 DIAGNOSIS — E871 Hypo-osmolality and hyponatremia: Secondary | ICD-10-CM | POA: Diagnosis not present

## 2024-05-25 DIAGNOSIS — G4700 Insomnia, unspecified: Secondary | ICD-10-CM | POA: Diagnosis not present

## 2024-05-25 DIAGNOSIS — Z885 Allergy status to narcotic agent status: Secondary | ICD-10-CM | POA: Diagnosis not present

## 2024-05-25 DIAGNOSIS — E7800 Pure hypercholesterolemia, unspecified: Secondary | ICD-10-CM | POA: Diagnosis not present

## 2024-05-25 DIAGNOSIS — R011 Cardiac murmur, unspecified: Secondary | ICD-10-CM | POA: Diagnosis not present

## 2024-05-25 HISTORY — DX: Unspecified synovitis and tenosynovitis, unspecified hand: M65.949

## 2024-05-25 LAB — CBC
HCT: 38.4 % — ABNORMAL LOW (ref 39.0–52.0)
Hemoglobin: 13.1 g/dL (ref 13.0–17.0)
MCH: 33.4 pg (ref 26.0–34.0)
MCHC: 34.1 g/dL (ref 30.0–36.0)
MCV: 98 fL (ref 80.0–100.0)
Platelets: 270 K/uL (ref 150–400)
RBC: 3.92 MIL/uL — ABNORMAL LOW (ref 4.22–5.81)
RDW: 13.4 % (ref 11.5–15.5)
WBC: 9.8 K/uL (ref 4.0–10.5)
nRBC: 0 % (ref 0.0–0.2)

## 2024-05-25 LAB — COMPREHENSIVE METABOLIC PANEL WITH GFR
ALT: 21 U/L (ref 0–44)
AST: 21 U/L (ref 15–41)
Albumin: 3.4 g/dL — ABNORMAL LOW (ref 3.5–5.0)
Alkaline Phosphatase: 91 U/L (ref 38–126)
Anion gap: 8 (ref 5–15)
BUN: 13 mg/dL (ref 8–23)
CO2: 23 mmol/L (ref 22–32)
Calcium: 8.8 mg/dL — ABNORMAL LOW (ref 8.9–10.3)
Chloride: 102 mmol/L (ref 98–111)
Creatinine, Ser: 0.81 mg/dL (ref 0.61–1.24)
GFR, Estimated: >60 mL/min (ref >60)
Glucose, Bld: 151 mg/dL — ABNORMAL HIGH (ref 70–99)
Potassium: 3.8 mmol/L (ref 3.5–5.1)
Sodium: 133 mmol/L — ABNORMAL LOW (ref 135–145)
Total Bilirubin: 0.7 mg/dL (ref 0.0–1.2)
Total Protein: 7 g/dL (ref 6.5–8.1)

## 2024-05-25 MED ORDER — ALUM & MAG HYDROXIDE-SIMETH 200-200-20 MG/5ML PO SUSP: 30.0000 mL | Freq: Four times a day (QID) | ORAL | Status: DC | PRN

## 2024-05-25 NOTE — Progress Notes (Signed)
 Progress Note   Patient: [name]. FMW:[id number] DOB: [birth year] DOA: 05/24/2024     0 DOS: the patient was seen and examined on 05/25/2024   Brief hospital course: Laith Antonelli. is a 89 y.o. right-handed male with a past medical history significant for ITP, HTN, HLD, and prostate cancer who presents with concern for right hand infection   Assessment and Plan: Tenosynovitis of finger and hand The patient states that he first noticed that the 5th digit of the right hand was painful on Wednesday night. It has progressively gotten worse until the pain was quite severe on 05/23/2024. X-ray and presentation were consistent with tenosynovitis Plan of care discussed with orthopedic surgeon Continue current antibiotics Follow-up on culture results Patient underwent irrigation of the Afif finger flexor tendon and debridement by orthopedic surgeon on 05/24/2024    Uncontrolled hypertension The patient's blood pressure at home is controlled with zestril  20 mg daily and propranolol  40 mg daily. This will be continued as inpatient.   Pure hypercholesterolemia Continue pravachol  20 mg at bedtime.    Eye globe prosthesis Left eye.   History of ITP The patient's platelets today are 276. The patient has previously taken cellcept  and promacta  for this problem, but lately, according to the patient's wife, the patient's platelets have been stable and he hardly takes them anymore. His rheumatologist is considering taking him off of them altogether. They will be held during this admission due to infection.   I have seen and examined this patient myself. I have spent 62 minutes in his evaluation and admission.    Advance Care Planning:   Code Status: Full Code    Consults: Orthopedic surgery   Family Communication: Wife is at bedside.   Subjective:  Patient admits to minimal pain involving the right hand Denies nausea vomiting abdominal pain chest pain or cough  Physical Exam: Elderly  male laying in bed in no acute distress Neck:  neck appears normal, no masses, normal ROM, supple no thyromegaly Respiratory:  No increased work of breathing. Cardiovascular:  Regular rate and rhythm Abdomen:  Abdomen is soft, non-tender, non-distended No hernias, masses, or organomegaly Normoactive bowel sounds.  Musculoskeletal: Dressing today and noted appears clean and dry Skin:  No rashes, lesions, ulcers palpation of skin: no induration or nodules Neurologic:  CN 2-12 intact Sensation all 4 extremities intact Psychiatric:  Mental status Mood, affect appropriate Orientation to person, place, time  judgment and insight appear intact  Vitals:   05/25/24 0358 05/25/24 0743 05/25/24 1611 05/25/24 1654  BP: (!) 164/90 (!) 172/86 (!) 183/83 (!) 165/78  Pulse: 77 61 (!) 55 62  Resp:  17 17   Temp:  97.7 F (36.5 C) 97.8 F (36.6 C)   TempSrc:  Oral Oral   SpO2: 94% 96% 99%   Weight:      Height:        Data Reviewed: Hand x-ray shows soft tissue swelling involving the right fifth finger with no evidence of bony destruction or foreign body     Latest Ref Rng & Units 05/25/2024    5:13 AM 05/24/2024   11:56 AM 04/28/2024    1:10 PM  CBC  WBC 4.0 - 10.5 K/uL 9.8  10.9  7.2   Hemoglobin 13.0 - 17.0 g/dL 86.8  87.6  87.4   Hematocrit 39.0 - 52.0 % 38.4  36.0  37.2   Platelets 150 - 400 K/uL 270  276  270  Latest Ref Rng & Units 05/25/2024    5:13 AM 05/24/2024   11:56 AM 04/28/2024    1:11 PM  BMP  Glucose 70 - 99 mg/dL 848  98  98   BUN 8 - 23 mg/dL 13  15  18    Creatinine 0.61 - 1.24 mg/dL 9.18  9.09  9.07   Sodium 135 - 145 mmol/L 133  135  134   Potassium 3.5 - 5.1 mmol/L 3.8  3.9  4.1   Chloride 98 - 111 mmol/L 102  104  102   CO2 22 - 32 mmol/L 23  25  25    Calcium 8.9 - 10.3 mg/dL 8.8  8.9  8.6       Time spent: 55 minutes  Author: Drue ONEIDA Potter, MD 05/25/2024 5:00 PM  For on call review [email or web address].

## 2024-05-25 NOTE — Progress Notes (Signed)
  Subjective: 1 Day Post-Op Procedure(s) (LRB): RIGHT FIFTH FINGER TENDON FLEXOR IRRIGATION AND DEBRIDEMENT (Right) Patient reports pain as mild.   Patient is well, and has had no acute complaints or problems Plan is to go Home after hospital stay. Negative for chest pain and shortness of breath Fever: no Gastrointestinal:Negative for nausea and vomiting this morning. Patient reports he is passing gas this morning.  Objective: Vital signs in last 24 hours: Temp:  [97 F (36.1 C)-97.9 F (36.6 C)] 97.7 F (36.5 C) (07/06 0743) Pulse Rate:  [61-80] 61 (07/06 0743) Resp:  [13-20] 17 (07/06 0743) BP: (145-172)/(77-106) 172/86 (07/06 0743) SpO2:  [94 %-100 %] 96 % (07/06 0743)  Intake/Output from previous day:  Intake/Output Summary (Last 24 hours) at 05/25/2024 1243 Last data filed at 05/25/2024 0000 Gross per 24 hour  Intake 1250 ml  Output 325 ml  Net 925 ml    Intake/Output this shift: No intake/output data recorded.  Labs: Recent Labs    05/24/24 1156 05/25/24 0513  HGB 12.3* 13.1   Recent Labs    05/24/24 1156 05/25/24 0513  WBC 10.9* 9.8  RBC 3.60* 3.92*  HCT 36.0* 38.4*  PLT 276 270   Recent Labs    05/24/24 1156 05/25/24 0513  NA 135 133*  K 3.9 3.8  CL 104 102  CO2 25 23  BUN 15 13  CREATININE 0.90 0.81  GLUCOSE 98 151*  CALCIUM 8.9 8.8*   No results for input(s): LABPT, INR in the last 72 hours.   EXAM General - Patient is Alert, Appropriate, and Oriented Extremity - Dressing applied to the right hand this morning. Dressing removed this morning. Swelling has improved compared to yesterday, erythema improved. No purulent material noted. Tenderness improved over the volar aspect of the finger. Intact sensation to light touch. New dressing applied.    Past Medical History:  Diagnosis Date   Autoimmune hemolytic anemia (HCC)    Dehydration    Detached retina    Hypercholesteremia    Hypertension    Insomnia    ITP (idiopathic  thrombocytopenic purpura) 09/01/2015   Leukocytosis    Prostate cancer (HCC)    Shingles     Assessment/Plan: 1 Day Post-Op Procedure(s) (LRB): RIGHT FIFTH FINGER TENDON FLEXOR IRRIGATION AND DEBRIDEMENT (Right) Principal Problem:   Tenosynovitis of finger and hand Active Problems:   Uncontrolled hypertension   Drug-induced autoimmune hemolytic anemia (HCC)   Cancer of prostate w/low recurrence risk (T1-2a, Gleason<7 & PSA<10) (HCC)   Eye globe prosthesis   History of ITP   Pure hypercholesterolemia  Estimated body mass index is 26.16 kg/m as calculated from the following:   Height as of this encounter: 5' 7 (1.702 m).   Weight as of this encounter: 75.8 kg. Advance diet Up with therapy D/C IV fluids when tolerating po intake.  Labs and vitals reviewed.  WBC down to 9.8.  Hg 13.1. Swelling and erythema both improved.  New dressing applied. Cultures from surgery are pending at this time. Continue IV Vancomycin  and Maxipime . PT/OT eval.  DVT Prophylaxis - TED hose Activities as tolerated with the right hand.  DOROTHA Gustavo Level, PA-C [facility] 05/25/2024, 12:43 PM

## 2024-05-25 NOTE — Care Management CC44 (Signed)
 Condition Code 44 Documentation Completed  Patient Details  Name: [name]. MRN: [id number] Date of Birth: 05/03/1932   Condition Code 44 given:    Patient signature on Condition Code 44 notice:    Documentation of 2 MD's agreement:    Code 44 added to claim:       Marinda Cooks, RN 05/25/2024, 8:51 AM

## 2024-05-25 NOTE — Evaluation (Signed)
 Physical Therapy Evaluation Patient Details Name: [name]. MRN: [id number] DOB: May 03, 1932 Today's Date: 05/25/2024  History of Present Illness  Pt is an 89 y.o. right-handed male with a past medical history significant for ITP, HTN, HLD, prosthetic L eye, and prostate cancer who presents with concern for right hand infection.  MD assessment includes right 5th finger flexor tenosynovitis and pt is now s/p R 5th finger flexor tendon irrigation and debridement.   Clinical Impression  Pt was pleasant and motivated to participate during the session and put forth good effort throughout. Pt required some cuing for sequencing and extra time and effort with below functional tasks but required no physical assistance.  Pt was generally steady with standing activities with the RW but has an extensive fall history at home without the use of an AD.  Pt reported no adverse symptoms during the session with SpO2 unable to be obtained due to poor pleth on both hands.  Pt will benefit from continued PT services upon discharge to safely address deficits listed in patient problem list for decreased caregiver assistance and eventual return to PLOF.           If plan is discharge home, recommend the following: A little help with walking and/or transfers;A little help with bathing/dressing/bathroom;Assistance with cooking/housework;Direct supervision/assist for medications management;Supervision due to cognitive status;Assist for transportation;Help with stairs or ramp for entrance   Can travel by private vehicle        Equipment Recommendations Rolling walker (2 wheels)  Recommendations for Other Services       Functional Status Assessment Patient has had a recent decline in their functional status and demonstrates the ability to make significant improvements in function in a reasonable and predictable amount of time.     Precautions / Restrictions Precautions Precautions:  Fall Restrictions Weight Bearing Restrictions Per Provider Order: Yes RUE Weight Bearing Per Provider Order: Weight bearing as tolerated Other Position/Activity Restrictions: WBAT to R hand per Dr. Tobie      Mobility  Bed Mobility Overal bed mobility: Modified Independent             General bed mobility comments: Extra time and effort required but no physical assist needed    Transfers Overall transfer level: Needs assistance Equipment used: Rolling walker (2 wheels)               General transfer comment: Min cuing for hand placement with extra time and effort needed to come to standing    Ambulation/Gait Ambulation/Gait assistance: Supervision Gait Distance (Feet): 200 Feet Assistive device: Rolling walker (2 wheels) Gait Pattern/deviations: Step-through pattern, Decreased step length - right, Decreased step length - left Gait velocity: decreased     General Gait Details: Slow cadence but generally steady with the RW with no overt LOB  Stairs Stairs: Yes Stairs assistance: Contact guard assist Stair Management: One rail Right, Alternating pattern, Forwards Number of Stairs: 4 General stair comments: Good eccentric and concentric control and stability with cues for hand placement on rail  Wheelchair Mobility     Tilt Bed    Modified Rankin (Stroke Patients Only)       Balance Overall balance assessment: Needs assistance, History of Falls Sitting-balance support: No upper extremity supported Sitting balance-Leahy Scale: Normal     Standing balance support: Bilateral upper extremity supported, During functional activity Standing balance-Leahy Scale: Good  Pertinent Vitals/Pain Pain Assessment Pain Assessment: No/denies pain    Home Living Family/patient expects to be discharged to:: Private residence Living Arrangements: Spouse/significant other Available Help at Discharge: Family;Available 24  hours/day Type of Home: House Home Access: Stairs to enter Entrance Stairs-Rails: Right Entrance Stairs-Number of Steps: 4   Home Layout: One level Home Equipment: Standard Walker;Cane - quad      Prior Function Prior Level of Function : Independent/Modified Independent;History of Falls (last six months)             Mobility Comments: Ind amb community distances without an AD, 4+ falls in the last 6 months secondary to LOB ADLs Comments: Ind with ADLs     Extremity/Trunk Assessment   Upper Extremity Assessment Upper Extremity Assessment: Defer to OT evaluation    Lower Extremity Assessment Lower Extremity Assessment: Generalized weakness       Communication   Communication Factors Affecting Communication: Hearing impaired    Cognition Arousal: Alert Behavior During Therapy: WFL for tasks assessed/performed   PT - Cognitive impairments: Memory, Awareness                       PT - Cognition Comments: Per spouse pt with some mild confusion compared to baseline Following commands: Impaired Following commands impaired: Follows one step commands with increased time     Cueing Cueing Techniques: Verbal cues, Visual cues     General Comments      Exercises     Assessment/Plan    PT Assessment Patient needs continued PT services  PT Problem List Decreased strength;Decreased activity tolerance;Decreased balance;Decreased mobility;Decreased knowledge of use of DME       PT Treatment Interventions DME instruction;Gait training;Stair training;Functional mobility training;Therapeutic activities;Therapeutic exercise;Balance training;Patient/family education    PT Goals (Current goals can be found in the Care Plan section)  Acute Rehab PT Goals Patient Stated Goal: Improved balance PT Goal Formulation: With patient Time For Goal Achievement: 06/07/24 Potential to Achieve Goals: Good    Frequency Min 2X/week     Co-evaluation                AM-PAC PT 6 Clicks Mobility  Outcome Measure Help needed turning from your back to your side while in a flat bed without using bedrails?: A Little Help needed moving from lying on your back to sitting on the side of a flat bed without using bedrails?: A Little Help needed moving to and from a bed to a chair (including a wheelchair)?: A Little Help needed standing up from a chair using your arms (e.g., wheelchair or bedside chair)?: A Little Help needed to walk in hospital room?: A Little Help needed climbing 3-5 steps with a railing? : A Little 6 Click Score: 18    End of Session Equipment Utilized During Treatment: Gait belt Activity Tolerance: Patient tolerated treatment well Patient left: Other (comment) (Pt left with OT for OT evaluation) Nurse Communication: Mobility status PT Visit Diagnosis: History of falling (Z91.81);Muscle weakness (generalized) (M62.81);Difficulty in walking, not elsewhere classified (R26.2)    Time: 9049-8982 PT Time Calculation (min) (ACUTE ONLY): 27 min   Charges:   PT Evaluation $PT Eval Moderate Complexity: 1 Mod PT Treatments $Gait Training: 8-22 mins PT General Charges $$ ACUTE PT VISIT: 1 Visit    D. Scott [place] Yeats PT, DPT 05/25/24, 10:43 AM

## 2024-05-25 NOTE — TOC Progression Note (Signed)
 Transition of Care (TOC) - Progression Note    Patient Details  Name: [name]. MRN: [id number] Date of Birth: 1932-03-27  Transition of Care [facility]) CM/SW Contact  Marinda Cooks, RN Phone Number: 05/25/2024, 3:57 PM  Clinical Narrative:    This CM spoke with pt and wife regarding dc plan for [facility] and DME. Pt agreed with plan and informed he did not have a agency preference. This CM secured HH with  and DME with [facility]. TOC will cont to follow dc planning / care coordination and update as applicable.         Expected Discharge Plan and Services    [facility] & DME                                            Social Determinants of Health (SDOH) Interventions SDOH Screenings   Food Insecurity: No Food Insecurity (05/24/2024)  Housing: Low Risk  (05/24/2024)  Transportation Needs: No Transportation Needs (05/24/2024)  Utilities: Not At Risk (05/24/2024)  Financial Resource Strain: Patient Declined (11/06/2023)   Received from [facility]  Social Connections: Moderately Integrated (05/24/2024)  Tobacco Use: Low Risk  (05/24/2024)    Readmission Risk Interventions     No data to display

## 2024-05-25 NOTE — Evaluation (Signed)
 Occupational Therapy Evaluation Patient Details Name: [name]. MRN: [id number] DOB: 05-Oct-1932 Today's Date: 05/25/2024   History of Present Illness   Pt is an 89 y.o. right-handed male with a past medical history significant for ITP, HTN, HLD, and prostate cancer who presents with concern for right hand infection.  MD assessment includes right 5th finger flexor tenosynovitis and pt is now s/p R 5th finger flexor tendon irrigation and debridement.    Clinical Impressions Pt was seen for OT evaluation this date. Prior to hospital admission, pt was independent with history of multiple falls. Spouse reports she manages medications. Pt presents to acute OT demonstrating impaired ADL performance and functional mobility 2/2 decreased strength, balance, safety awareness/awareness of deficits, and now limited functional use of R hand after I&D (See OT problem list for additional functional deficits). Pt currently requires CGA for ADL mobility with RW, VC for safety, and PRN MIN A For LB ADL tasks. Pt/spouse educated in home/routines modifications, falls prevention strategies, AE/DME. Pt/spouse verbalized understanding. Pt would benefit from skilled OT services to address noted impairments and functional limitations (see below for any additional details) in order to maximize safety and independence while minimizing falls risk and caregiver burden. Anticipate the need for follow up OT services upon acute hospital DC.    If plan is discharge home, recommend the following:   A little help with bathing/dressing/bathroom;Assistance with cooking/housework;Assist for transportation;Help with stairs or ramp for entrance     Functional Status Assessment   Patient has had a recent decline in their functional status and demonstrates the ability to make significant improvements in function in a reasonable and predictable amount of time.     Equipment Recommendations   Tub/shower seat      Recommendations for Other Services         Precautions/Restrictions   Precautions Precautions: Fall Recall of Precautions/Restrictions: Impaired Restrictions Weight Bearing Restrictions Per Provider Order: Yes RUE Weight Bearing Per Provider Order: Weight bearing as tolerated Other Position/Activity Restrictions: WBAT to R hand per Dr. Tobie     Mobility Bed Mobility               General bed mobility comments: NT, ambulating at start of session, in recliner at end of session    Transfers Overall transfer level: Needs assistance Equipment used: Rolling walker (2 wheels) Transfers: Sit to/from Stand Sit to Stand: Contact guard assist, Min assist           General transfer comment: VC for hand placement      Balance Overall balance assessment: Needs assistance, History of Falls Sitting-balance support: No upper extremity supported Sitting balance-Leahy Scale: Normal     Standing balance support: Bilateral upper extremity supported, During functional activity Standing balance-Leahy Scale: Good                             ADL either performed or assessed with clinical judgement   ADL Overall ADL's : Needs assistance/impaired                                       General ADL Comments: PRN MIN A For LB ADL tasks, CGA for ADL mobility wiht RW.      Pertinent Vitals/Pain Pain Assessment Pain Assessment: No/denies pain     Extremity/Trunk Assessment Upper Extremity Assessment Upper Extremity Assessment: Generalized weakness;Right hand  dominant;RUE deficits/detail RUE Deficits / Details: 5th finger bandaged, able to WB through RW RUE: Unable to fully assess due to immobilization RUE Coordination: decreased fine motor   Lower Extremity Assessment Lower Extremity Assessment: Generalized weakness       Communication Communication Factors Affecting Communication: Hearing impaired   Cognition Arousal: Alert Behavior  During Therapy: WFL for tasks assessed/performed      OT - Cognition Comments: Decreased safety awareness, increased processing/problem solving time      Following commands: Impaired Following commands impaired: Follows one step commands with increased time     Cueing  General Comments   Cueing Techniques: Verbal cues;Visual cues      Exercises Other Exercises Other Exercises: Pt/spouse educated in home/routines modifications, falls prevention strategies, AE/DME        Home Living Family/patient expects to be discharged to:: Private residence Living Arrangements: Spouse/significant other Available Help at Discharge: Family;Available 24 hours/day Type of Home: House Home Access: Stairs to enter [employer] of Steps: 4 Entrance Stairs-Rails: Right Home Layout: One level     Bathroom Shower/Tub: Walk-in shower (stall shower)         Home Equipment: Standard Walker;Cane - quad          Prior Functioning/Environment Prior Level of Function : Independent/Modified Independent;History of Falls (last six months)             Mobility Comments: Ind amb community distances without an AD, 4+ falls in the last 6 months secondary to LOB ADLs Comments: Ind with ADLs, spouse assists with med mgt    OT Problem List: Decreased strength;Decreased coordination;Pain;Decreased activity tolerance;Decreased safety awareness;Impaired balance (sitting and/or standing);Decreased knowledge of use of DME or AE;Impaired UE functional use;Decreased knowledge of precautions;Decreased range of motion   OT Treatment/Interventions: Self-care/ADL training;Therapeutic exercise;Therapeutic activities;DME and/or AE instruction;Patient/family education;Balance training;Manual therapy      OT Goals(Current goals can be found in the care plan section)   Acute Rehab OT Goals Patient Stated Goal: be independent OT Goal Formulation: With patient/family Time For Goal Achievement:  06/08/24 Potential to Achieve Goals: Good ADL Goals Pt Will Perform Upper Body Dressing: with caregiver independent in assisting;sitting Pt Will Perform Lower Body Dressing: sit to/from stand;with caregiver independent in assisting Pt Will Transfer to Toilet: with supervision;ambulating (LRAD) Pt Will Perform Toileting - Clothing Manipulation and hygiene: with modified independence;sitting/lateral leans Additional ADL Goal #1: Pt will negotiate room/bathroom wiht RW requiring supervision only, PRN VC for safety, 4/4 opportunities.   OT Frequency:  Min 2X/week    Co-evaluation              AM-PAC OT 6 Clicks Daily Activity     Outcome Measure Help from another person eating meals?: None Help from another person taking care of personal grooming?: A Little Help from another person toileting, which includes using toliet, bedpan, or urinal?: A Little Help from another person bathing (including washing, rinsing, drying)?: A Little Help from another person to put on and taking off regular upper body clothing?: A Little Help from another person to put on and taking off regular lower body clothing?: A Little 6 Click Score: 19   End of Session Equipment Utilized During Treatment: Gait belt;Rolling walker (2 wheels)  Activity Tolerance: Patient tolerated treatment well Patient left: in chair;with call bell/phone within reach;with chair alarm set;with family/visitor present  OT Visit Diagnosis: Other abnormalities of gait and mobility (R26.89);Repeated falls (R29.6);Muscle weakness (generalized) (M62.81)  Time: 8981-8961 OT Time Calculation (min): 20 min Charges:  OT General Charges $OT Visit: 1 Visit OT Evaluation $OT Eval Low Complexity: 1 Low OT Treatments $Self Care/Home Management : 8-22 mins  Warren SAUNDERS., MPH, MS, OTR/L ascom [phone] 05/25/24, 1:41 PM

## 2024-05-25 NOTE — Plan of Care (Signed)

## 2024-05-26 DIAGNOSIS — M65949 Unspecified synovitis and tenosynovitis, unspecified hand: Secondary | ICD-10-CM | POA: Diagnosis not present

## 2024-05-26 LAB — GLUCOSE, CAPILLARY: Glucose-Capillary: 120 mg/dL — ABNORMAL HIGH (ref 70–99)

## 2024-05-26 LAB — CBC WITH DIFFERENTIAL/PLATELET
Abs Immature Granulocytes: 0.07 K/uL (ref 0.00–0.07)
Basophils Absolute: 0 K/uL (ref 0.0–0.1)
Basophils Relative: 0 %
Eosinophils Absolute: 0.1 K/uL (ref 0.0–0.5)
Eosinophils Relative: 1 %
HCT: 35 % — ABNORMAL LOW (ref 39.0–52.0)
Hemoglobin: 12 g/dL — ABNORMAL LOW (ref 13.0–17.0)
Immature Granulocytes: 1 %
Lymphocytes Relative: 16 %
Lymphs Abs: 2.1 K/uL (ref 0.7–4.0)
MCH: 33.9 pg (ref 26.0–34.0)
MCHC: 34.3 g/dL (ref 30.0–36.0)
MCV: 98.9 fL (ref 80.0–100.0)
Monocytes Absolute: 1.1 K/uL — ABNORMAL HIGH (ref 0.1–1.0)
Monocytes Relative: 9 %
Neutro Abs: 9.9 K/uL — ABNORMAL HIGH (ref 1.7–7.7)
Neutrophils Relative %: 73 %
Platelets: 273 K/uL (ref 150–400)
RBC: 3.54 MIL/uL — ABNORMAL LOW (ref 4.22–5.81)
RDW: 13.6 % (ref 11.5–15.5)
WBC: 13.3 K/uL — ABNORMAL HIGH (ref 4.0–10.5)
nRBC: 0 % (ref 0.0–0.2)

## 2024-05-26 LAB — BASIC METABOLIC PANEL WITH GFR
Anion gap: 8 (ref 5–15)
BUN: 20 mg/dL (ref 8–23)
CO2: 23 mmol/L (ref 22–32)
Calcium: 8.6 mg/dL — ABNORMAL LOW (ref 8.9–10.3)
Chloride: 103 mmol/L (ref 98–111)
Creatinine, Ser: 0.9 mg/dL (ref 0.61–1.24)
GFR, Estimated: >60 mL/min (ref >60)
Glucose, Bld: 101 mg/dL — ABNORMAL HIGH (ref 70–99)
Potassium: 3.4 mmol/L — ABNORMAL LOW (ref 3.5–5.1)
Sodium: 134 mmol/L — ABNORMAL LOW (ref 135–145)

## 2024-05-26 MED ORDER — POTASSIUM CHLORIDE 20 MEQ PO PACK
40.0000 meq | PACK | Freq: Once | ORAL | Status: AC
  Administered 2024-05-26: 40 meq via ORAL
  Filled 2024-05-26: qty 2

## 2024-05-26 NOTE — Plan of Care (Signed)
  Problem: Activity: Goal: Risk for activity intolerance will decrease Outcome: Progressing   Problem: Nutrition: Goal: Adequate nutrition will be maintained Outcome: Progressing   Problem: Coping: Goal: Level of anxiety will decrease Outcome: Progressing   Problem: Pain Managment: Goal: General experience of comfort will improve and/or be controlled Outcome: Progressing   Problem: Safety: Goal: Ability to remain free from injury will improve Outcome: Progressing   Problem: Elimination: Goal: Will not experience complications related to bowel motility Outcome: Not Progressing

## 2024-05-26 NOTE — Plan of Care (Signed)

## 2024-05-26 NOTE — Progress Notes (Signed)
 Physical Therapy Treatment Patient Details Name: [name]. MRN: [id number] DOB: October 24, 1932 Today's Date: 05/26/2024   History of Present Illness Pt is an 89 y.o. right-handed male with a past medical history significant for ITP, HTN, HLD, and prostate cancer who presents with concern for right hand infection.  MD assessment includes right 5th finger flexor tenosynovitis and pt is now s/p R 5th finger flexor tendon irrigation and debridement.    PT Comments  Pt ready for session.  Does need light min a to get to EOB.  Cues for hand placements as he pulls up on walker to assist.  He completes x 1 lap on unit with RW with cga x 1, to bathroom for unsuccessful BM attempt before opting to sit in recliner.  RW delivered to room is adjusted to height and RN aware of pt concerns over no BM since Saturday.  Pt would benefit from +1 at home for general safety with mobility.   If plan is discharge home, recommend the following: A little help with walking and/or transfers;A little help with bathing/dressing/bathroom;Assistance with cooking/housework;Direct supervision/assist for medications management;Supervision due to cognitive status;Assist for transportation;Help with stairs or ramp for entrance   Can travel by private vehicle        Equipment Recommendations  Rolling walker (2 wheels)    Recommendations for Other Services       Precautions / Restrictions Precautions Precautions: Fall Recall of Precautions/Restrictions: Impaired Restrictions Weight Bearing Restrictions Per Provider Order: Yes RUE Weight Bearing Per Provider Order: Weight bearing as tolerated Other Position/Activity Restrictions: WBAT to R hand per Dr. Tobie     Mobility  Bed Mobility Overal bed mobility: Needs Assistance Bed Mobility: Supine to Sit     Supine to sit: Min assist       Patient Response: Cooperative  Transfers Overall transfer level: Needs assistance Equipment used: Rolling walker (2  wheels) Transfers: Sit to/from Stand Sit to Stand: Contact guard assist, Min assist           General transfer comment: VC for hand placement    Ambulation/Gait Ambulation/Gait assistance: Contact guard assist Gait Distance (Feet): 200 Feet Assistive device: Rolling walker (2 wheels) Gait Pattern/deviations: Step-through pattern, Decreased step length - right, Decreased step length - left Gait velocity: decreased     General Gait Details: some general unsteadiness and cues for awareness but no formal LOB's but does have increased risk for falls   Stairs             Wheelchair Mobility     Tilt Bed Tilt Bed Patient Response: Cooperative  Modified Rankin (Stroke Patients Only)       Balance Overall balance assessment: Needs assistance, History of Falls Sitting-balance support: No upper extremity supported Sitting balance-Leahy Scale: Normal     Standing balance support: Bilateral upper extremity supported, During functional activity Standing balance-Leahy Scale: Fair Standing balance comment: RW for support                            Communication    Cognition Arousal: Alert Behavior During Therapy: WFL for tasks assessed/performed, Impulsive   PT - Cognitive impairments: Memory, Awareness                       PT - Cognition Comments: cues for safety and awarenss Following commands: Impaired Following commands impaired: Follows one step commands with increased time, Follows one step commands inconsistently  Cueing Cueing Techniques: Verbal cues, Visual cues  Exercises      General Comments        Pertinent Vitals/Pain Pain Assessment Pain Assessment: No/denies pain    Home Living                          Prior Function            PT Goals (current goals can now be found in the care plan section) Progress towards PT goals: Progressing toward goals    Frequency    Min 2X/week      PT Plan       Co-evaluation              AM-PAC PT 6 Clicks Mobility   Outcome Measure  Help needed turning from your back to your side while in a flat bed without using bedrails?: A Little Help needed moving from lying on your back to sitting on the side of a flat bed without using bedrails?: A Little Help needed moving to and from a bed to a chair (including a wheelchair)?: A Little Help needed standing up from a chair using your arms (e.g., wheelchair or bedside chair)?: A Little Help needed to walk in hospital room?: A Little Help needed climbing 3-5 steps with a railing? : A Little 6 Click Score: 18    End of Session Equipment Utilized During Treatment: Gait belt Activity Tolerance: Patient tolerated treatment well Patient left: Other (comment) (Pt left with OT for OT evaluation) Nurse Communication: Mobility status PT Visit Diagnosis: History of falling (Z91.81);Muscle weakness (generalized) (M62.81);Difficulty in walking, not elsewhere classified (R26.2)     Time: 9086-9073 PT Time Calculation (min) (ACUTE ONLY): 13 min  Charges:    $Gait Training: 8-22 mins PT General Charges $$ ACUTE PT VISIT: 1 Visit                   Lauraine Gills, PTA 05/26/24, 9:45 AM

## 2024-05-26 NOTE — Progress Notes (Signed)
  Subjective: 2 Days Post-Op Procedure(s) (LRB): RIGHT FIFTH FINGER TENDON FLEXOR IRRIGATION AND DEBRIDEMENT (Right) Patient reports pain as mild.   Patient is well, and has had no acute complaints or problems Plan is to go Home after hospital stay. Negative for chest pain and shortness of breath Fever: no Gastrointestinal:Negative for nausea and vomiting this morning. Patient reports he is passing gas this morning.  Objective: Vital signs in last 24 hours: Temp:  [97.5 F (36.4 C)-97.8 F (36.6 C)] 97.8 F (36.6 C) (07/07 0435) Pulse Rate:  [55-62] 59 (07/07 0435) Resp:  [14-17] 14 (07/07 0435) BP: (148-183)/(77-86) 148/77 (07/07 0435) SpO2:  [96 %-99 %] 99 % (07/07 0435)  Intake/Output from previous day:  Intake/Output Summary (Last 24 hours) at 05/26/2024 0707 Last data filed at 05/26/2024 0441 Gross per 24 hour  Intake 640 ml  Output 850 ml  Net -210 ml    Intake/Output this shift: No intake/output data recorded.  Labs: Recent Labs    05/24/24 1156 05/25/24 0513 05/26/24 0344  HGB 12.3* 13.1 12.0*   Recent Labs    05/25/24 0513 05/26/24 0344  WBC 9.8 13.3*  RBC 3.92* 3.54*  HCT 38.4* 35.0*  PLT 270 273   Recent Labs    05/25/24 0513 05/26/24 0344  NA 133* 134*  K 3.8 3.4*  CL 102 103  CO2 23 23  BUN 13 20  CREATININE 0.81 0.90  GLUCOSE 151* 101*  CALCIUM 8.8* 8.6*   No results for input(s): LABPT, INR in the last 72 hours.   EXAM General - Patient is Alert, Appropriate, and Oriented Extremity - Dressing applied to the right hand this morning. Dressing removed this morning. Swelling has improved compared to yesterday, erythema improved. No purulent material noted. Tenderness improved over the volar aspect of the finger. Intact sensation to light touch. New dressing applied.    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   Leukocytosis    Prostate cancer (HCC)    Shingles     Assessment/Plan: 2 Days Post-Op Procedure(s) (LRB): RIGHT FIFTH FINGER TENDON FLEXOR IRRIGATION AND DEBRIDEMENT (Right) Principal Problem:   Tenosynovitis of finger and hand Active Problems:   Uncontrolled hypertension   Drug-induced autoimmune hemolytic anemia (HCC)   Cancer of prostate w/low recurrence risk (T1-2a, Gleason<7 & PSA<10) (HCC)   Eye globe prosthesis   History of ITP   Pure hypercholesterolemia   Tenosynovitis of finger  Estimated body mass index is 26.16 kg/m as calculated from the following:   Height as of this encounter: 5' 7 (1.702 m).   Weight as of this encounter: 75.8 kg. Advance diet Up with therapy D/C IV fluids when tolerating po intake.  Labs and vitals reviewed.  WBC down to 9.8.  Hg 13.1. Swelling and erythema both improved.  New dressing applied. Cultures from surgery are pending at this time. Continue IV Vancomycin  and Maxipime . PT/OT eval.  DVT Prophylaxis - TED hose Activities as tolerated with the right hand.  Krystal Doyne [facility] 05/26/2024, 7:07 AM

## 2024-05-26 NOTE — Progress Notes (Signed)
 Progress Note   Patient: [name]. FMW:[id number] DOB: [birth year] DOA: 05/24/2024     1 DOS: the patient was seen and examined on 05/26/2024     Brief hospital course: [name]. is a 89 y.o. right-handed male with a past medical history significant for ITP, HTN, HLD, and prostate cancer who presents with concern for right hand infection.    Assessment and Plan: Tenosynovitis of right fifth finger  X-ray and presentation were consistent with tenosynovitis Plan of care discussed with orthopedic surgeon Continue current antibiotics Continue to follow-up on culture results Patient underwent irrigation of the Afif finger flexor tendon and debridement by orthopedic surgeon on 05/24/2024      Uncontrolled hypertension The patient's blood pressure at home is controlled with zestril  20 mg daily and propranolol  40 mg daily. This will be continued as inpatient.   Pure hypercholesterolemia Continue pravachol  20 mg at bedtime.    Eye globe prosthesis Left eye.   History of ITP Platelet currently stable.  Outpatient follow-up     Advance Care Planning:   Code Status: Full Code    Consults: Orthopedic surgery   Family Communication: Wife is at bedside.     Subjective:  Patient admits to minimal pain involving the right hand Denies nausea vomiting abdominal pain chest pain or cough   Physical Exam: Elderly male laying in bed in no acute distress Neck:  neck appears normal, no masses, normal ROM, supple no thyromegaly Respiratory:  No increased work of breathing. Cardiovascular:  Regular rate and rhythm Abdomen:  Abdomen is soft, non-tender, non-distended No hernias, masses, or organomegaly Normoactive bowel sounds.  Musculoskeletal: Dressing Is clean and dry Skin:  No rashes, lesions, ulcers palpation of skin: no induration or nodules Neurologic:  CN 2-12 intact Sensation all 4 extremities intact Psychiatric:  Mental status Mood, affect  appropriate Orientation to person, place, time  judgment and insight appear intact      Data Reviewed: Blood culture pending  Vitals:   05/25/24 1947 05/26/24 0435 05/26/24 0753 05/26/24 0808  BP: (!) 159/80 (!) 148/77 (!) 150/80 (!) 155/84  Pulse: (!) 58 (!) 59 (!) 58 (!) 59  Resp: 16 14 16    Temp: (!) 97.5 F (36.4 C) 97.8 F (36.6 C) 97.9 F (36.6 C)   TempSrc:   Oral   SpO2: 99% 99% 98%   Weight:      Height:          Latest Ref Rng & Units 05/26/2024    3:44 AM 05/25/2024    5:13 AM 05/24/2024   11:56 AM  CBC  WBC 4.0 - 10.5 K/uL 13.3  9.8  10.9   Hemoglobin 13.0 - 17.0 g/dL 87.9  86.8  87.6   Hematocrit 39.0 - 52.0 % 35.0  38.4  36.0   Platelets 150 - 400 K/uL 273  270  276        Latest Ref Rng & Units 05/26/2024    3:44 AM 05/25/2024    5:13 AM 05/24/2024   11:56 AM  BMP  Glucose 70 - 99 mg/dL 898  848  98   BUN 8 - 23 mg/dL 20  13  15    Creatinine 0.61 - 1.24 mg/dL 9.09  9.18  9.09   Sodium 135 - 145 mmol/L 134  133  135   Potassium 3.5 - 5.1 mmol/L 3.4  3.8  3.9   Chloride 98 - 111 mmol/L 103  102  104   CO2 22 -  32 mmol/L 23  23  25    Calcium 8.9 - 10.3 mg/dL 8.6  8.8  8.9      Author: Drue ONEIDA Potter, MD 05/26/2024 2:39 PM  For on call review [email or web address].

## 2024-05-27 ENCOUNTER — Inpatient Hospital Stay: Attending: Internal Medicine

## 2024-05-27 DIAGNOSIS — M65949 Unspecified synovitis and tenosynovitis, unspecified hand: Secondary | ICD-10-CM | POA: Diagnosis not present

## 2024-05-27 LAB — CBC WITH DIFFERENTIAL/PLATELET
Abs Immature Granulocytes: 0.07 K/uL (ref 0.00–0.07)
Basophils Absolute: 0 K/uL (ref 0.0–0.1)
Basophils Relative: 0 %
Eosinophils Absolute: 0.1 K/uL (ref 0.0–0.5)
Eosinophils Relative: 1 %
HCT: 37.9 % — ABNORMAL LOW (ref 39.0–52.0)
Hemoglobin: 12.9 g/dL — ABNORMAL LOW (ref 13.0–17.0)
Immature Granulocytes: 1 %
Lymphocytes Relative: 10 %
Lymphs Abs: 1.4 K/uL (ref 0.7–4.0)
MCH: 34.3 pg — ABNORMAL HIGH (ref 26.0–34.0)
MCHC: 34 g/dL (ref 30.0–36.0)
MCV: 100.8 fL — ABNORMAL HIGH (ref 80.0–100.0)
Monocytes Absolute: 1.4 K/uL — ABNORMAL HIGH (ref 0.1–1.0)
Monocytes Relative: 10 %
Neutro Abs: 10.8 K/uL — ABNORMAL HIGH (ref 1.7–7.7)
Neutrophils Relative %: 78 %
Platelets: 276 K/uL (ref 150–400)
RBC: 3.76 MIL/uL — ABNORMAL LOW (ref 4.22–5.81)
RDW: 13.7 % (ref 11.5–15.5)
WBC: 13.7 K/uL — ABNORMAL HIGH (ref 4.0–10.5)
nRBC: 0 % (ref 0.0–0.2)

## 2024-05-27 LAB — BASIC METABOLIC PANEL WITH GFR
Anion gap: 9 (ref 5–15)
BUN: 18 mg/dL (ref 8–23)
CO2: 25 mmol/L (ref 22–32)
Calcium: 8.7 mg/dL — ABNORMAL LOW (ref 8.9–10.3)
Chloride: 98 mmol/L (ref 98–111)
Creatinine, Ser: 0.7 mg/dL (ref 0.61–1.24)
GFR, Estimated: >60 mL/min (ref >60)
Glucose, Bld: 105 mg/dL — ABNORMAL HIGH (ref 70–99)
Potassium: 3.8 mmol/L (ref 3.5–5.1)
Sodium: 132 mmol/L — ABNORMAL LOW (ref 135–145)

## 2024-05-27 MED ORDER — AMLODIPINE BESYLATE 5 MG PO TABS
5.0000 mg | ORAL_TABLET | Freq: Every day | ORAL | Status: DC
  Administered 2024-05-28 – 2024-05-29 (×2): 5 mg via ORAL
  Filled 2024-05-27 (×3): qty 1

## 2024-05-27 NOTE — TOC Progression Note (Signed)
 Transition of Care (TOC) - Progression Note    Patient Details  Name: [name]. MRN: [id number] Date of Birth: November 17, 1932  Transition of [facility]) CM/SW Contact  [place] L Lorma Heater, [place] Phone Number: 05/27/2024, 1:58 PM  Clinical Narrative:     CSW sent bed requests to preferred SNF re: [facility]; [facility]; [facility]; [place]       Expected Discharge Plan and Services                                               Social Determinants of Health (SDOH) Interventions SDOH Screenings   Food Insecurity: No Food Insecurity (05/24/2024)  Housing: Low Risk  (05/24/2024)  Transportation Needs: No Transportation Needs (05/24/2024)  Utilities: Not At Risk (05/24/2024)  Financial Resource Strain: Patient Declined (11/06/2023)   Received from [facility]  Social Connections: Moderately Integrated (05/24/2024)  Tobacco Use: Low Risk  (05/24/2024)    Readmission Risk Interventions     No data to display

## 2024-05-27 NOTE — Plan of Care (Signed)
  Problem: Education: Goal: Knowledge of General Education information will improve Description: Including pain rating scale, medication(s)/side effects and non-pharmacologic comfort measures Outcome: Progressing   Problem: Health Behavior/Discharge Planning: Goal: Ability to manage health-related needs will improve Outcome: Progressing   Problem: Clinical Measurements: Goal: Ability to maintain clinical measurements within normal limits will improve Outcome: Progressing Goal: Will remain free from infection Outcome: Progressing Goal: Diagnostic test results will improve Outcome: Progressing Goal: Respiratory complications will improve Outcome: Progressing Goal: Cardiovascular complication will be avoided Outcome: Progressing   Problem: Activity: Goal: Risk for activity intolerance will decrease Outcome: Progressing   Problem: Nutrition: Goal: Adequate nutrition will be maintained Outcome: Progressing   Problem: Coping: Goal: Level of anxiety will decrease Outcome: Progressing   Problem: Elimination: Goal: Will not experience complications related to bowel motility Outcome: Progressing Goal: Will not experience complications related to urinary retention Outcome: Progressing   Problem: Safety: Goal: Ability to remain free from injury will improve Outcome: Progressing   Problem: Skin Integrity: Goal: Risk for impaired skin integrity will decrease Outcome: Progressing   Problem: Clinical Measurements: Goal: Ability to avoid or minimize complications of infection will improve Outcome: Progressing   Problem: Skin Integrity: Goal: Skin integrity will improve Outcome: Progressing

## 2024-05-27 NOTE — Progress Notes (Signed)
 Progress Note   Patient: [name]. FMW:[id number] DOB: 1932/05/06 DOA: 05/24/2024     2 DOS: the patient was seen and examined on 05/27/2024   Brief hospital course: [name]. is a 89 y.o. right-handed male with a past medical history significant for ITP, HTN, HLD, and prostate cancer who presents with concern for right hand infection.    Assessment and Plan: Tenosynovitis of right fifth finger  X-ray and presentation were consistent with tenosynovitis Plan of care discussed with orthopedic surgeon Continue current antibiotics Continue to follow-up on culture results Patient underwent irrigation of the 5th finger flexor tendon and debridement by orthopedic surgeon on 05/24/2024  After discussing with pharmacy and orthopedics we will discontinue vancomycin . According to orthopedics patient can potentially be discharged on Augmentin tomorrow pending culture results   Uncontrolled hypertension The patient's blood pressure at home is controlled with zestril  20 mg daily and propranolol  40 mg daily. This will be continued as inpatient. Amlodipine  added given uncontrolled hypertension  Pure hypercholesterolemia Continue pravachol  20 mg at bedtime.    Eye globe prosthesis Left eye.   History of ITP Platelet currently stable.  Outpatient follow-up      Advance Care Planning:   Code Status: Full Code    Consults: Orthopedic surgery   Family Communication: Wife is at bedside.   Disposition: PT recommends skilled nursing facility, TOC informed  Subjective:  Patient seen and examined in the presence of the wife Denies any abdominal pain or chest pain Pain involving the right hand is improved   Physical Exam: Elderly male laying in bed in no acute distress Neck:  neck appears normal, no masses, normal ROM, supple no thyromegaly Respiratory:  No increased work of breathing. Cardiovascular:  Regular rate and rhythm Abdomen:  Abdomen is soft, non-tender,  non-distended No hernias, masses, or organomegaly Normoactive bowel sounds.  Musculoskeletal: Dressing Is clean and dry Skin:  No rashes, lesions, ulcers palpation of skin: no induration or nodules Neurologic:  CN 2-12 intact Sensation all 4 extremities intact Psychiatric:  Mental status Mood, affect appropriate Orientation to person, place, time  judgment and insight appear intact       Data Reviewed: Blood culture pending    Vitals:   05/26/24 2004 05/27/24 0406 05/27/24 0817 05/27/24 0834  BP: (!) 168/88 (!) 158/81 (!) 160/80 (!) 163/87  Pulse: 71 74 81   Resp: 16 18 17    Temp: 98.5 F (36.9 C) (!) 97.4 F (36.3 C) 98.1 F (36.7 C)   TempSrc:   Oral   SpO2: 99% 95% 97%   Weight:      Height:          Latest Ref Rng & Units 05/27/2024    5:05 AM 05/26/2024    3:44 AM 05/25/2024    5:13 AM  CBC  WBC 4.0 - 10.5 K/uL 13.7  13.3  9.8   Hemoglobin 13.0 - 17.0 g/dL 87.0  87.9  86.8   Hematocrit 39.0 - 52.0 % 37.9  35.0  38.4   Platelets 150 - 400 K/uL 276  273  270        Latest Ref Rng & Units 05/27/2024    5:05 AM 05/26/2024    3:44 AM 05/25/2024    5:13 AM  BMP  Glucose 70 - 99 mg/dL 894  898  848   BUN 8 - 23 mg/dL 18  20  13    Creatinine 0.61 - 1.24 mg/dL 9.29  9.09  9.18  Sodium 135 - 145 mmol/L 132  134  133   Potassium 3.5 - 5.1 mmol/L 3.8  3.4  3.8   Chloride 98 - 111 mmol/L 98  103  102   CO2 22 - 32 mmol/L 25  23  23    Calcium 8.9 - 10.3 mg/dL 8.7  8.6  8.8      Time spent: 40 minutes  Author: Drue ONEIDA Potter, MD 05/27/2024 2:00 PM  For on call review [email or web address].

## 2024-05-27 NOTE — NC FL2 (Signed)
 [place]  MEDICAID [id number] LEVEL OF CARE FORM     IDENTIFICATION  Patient Name: [name]. Birthdate: 1932/05/23 Sex: male Admission Date (Current Location): 05/24/2024  [facility] and [place] Number:  Chiropodist and Address:  [facility], [address], [place], [place] [address]      Provider Number: [id number]  Attending Physician Name and Address:  Dorinda Drue DASEN, MD  Relative Name and Phone Number:  Vick Filter [phone]    Current Level of Care: Hospital Recommended Level of Care: Skilled Nursing Facility Prior Approval Number:    Date Approved/Denied:   PASRR Number: [id number]  Discharge Plan: SNF    Current Diagnoses: Patient Active Problem List   Diagnosis Date Noted   Tenosynovitis of finger 05/25/2024   Tenosynovitis of finger and hand 05/24/2024   Olecranon bursitis, left elbow 09/06/2017   Lower urinary tract symptoms (LUTS) 10/18/2016   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   Nasal bleeding 01/08/2016   Uncontrolled hypertension 01/08/2016   Idiopathic thrombocytopenic purpura (HCC) 09/01/2015   Amaurosis fugax of right eye 08/10/2015   Acute renal failure (HCC) 04/06/2015   History of ITP 03/22/2015   Syncope 12/21/2014   Pure hypercholesterolemia 08/27/2014   Biceps tendon tear 04/09/2014   Cancer of prostate w/low recurrence risk (T1-2a, Gleason<7 & PSA<10) (HCC) 07/30/2012   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   Serous retinal detachment, right eye 06/11/2012    Orientation RESPIRATION BLADDER Height & Weight     Self, Time, Situation, Place  Normal Continent Weight: 167 lb (75.8 kg) Height:  5' 7 (170.2 cm)  BEHAVIORAL SYMPTOMS/MOOD NEUROLOGICAL BOWEL NUTRITION STATUS      Continent Diet (Heart Healthy)  AMBULATORY STATUS COMMUNICATION OF NEEDS Skin   Supervision Verbally Normal                       Personal Care Assistance Level of Assistance  Bathing, Feeding,  Dressing Bathing Assistance: Limited assistance Feeding assistance: Limited assistance Dressing Assistance: Limited assistance     Functional Limitations Info  Sight, Hearing, Speech Sight Info: Impaired (Artificial left eye, detached retina) Hearing Info: Adequate Speech Info: Adequate    SPECIAL CARE FACTORS FREQUENCY  PT (By licensed PT), OT (By licensed OT)     PT Frequency: 5x OT Frequency: 5x            Contractures Contractures Info: Not present    Additional Factors Info  Code Status, Allergies Code Status Info: Full Allergies Info: Cephalexin ; Hydrocodone-Acetamenophin           Current Medications (05/27/2024):  This is the current hospital active medication list Current Facility-Administered Medications  Medication Dose Route Frequency Provider Last Rate Last Admin   acetaminophen  (TYLENOL ) tablet 650 mg  650 mg Oral Q6H PRN Swayze, Ava, DO   650 mg at 05/27/24 0139   Or   acetaminophen  (TYLENOL ) suppository 650 mg  650 mg Rectal Q6H PRN Swayze, Ava, DO       alum & mag hydroxide-simeth (MAALOX/MYLANTA) 200-200-20 MG/5ML suspension 30 mL  30 mL Oral Q6H PRN Dorinda Drue DASEN, MD       bisacodyl  (DULCOLAX) suppository 10 mg  10 mg Rectal Daily PRN Swayze, Ava, DO       ceFEPIme  (MAXIPIME ) 2 g in sodium chloride  0.9 % 100 mL IVPB  2 g Intravenous Q12H Elesa Perkins, RPH 200 mL/hr at 05/27/24 1028 2 g at 05/27/24 1028   citalopram  (CELEXA ) tablet 20  mg  20 mg Oral Daily Swayze, Ava, DO   20 mg at 05/27/24 1014   dorzolamide  (TRUSOPT ) 2 % ophthalmic solution 1 drop  1 drop Right Eye BID Swayze, Ava, DO   1 drop at 05/27/24 1019   latanoprost  (XALATAN ) 0.005 % ophthalmic solution 1 drop  1 drop Right Eye QHS Swayze, Ava, DO   1 drop at 05/26/24 2122   lisinopril  (ZESTRIL ) tablet 20 mg  20 mg Oral Daily Swayze, Ava, DO   20 mg at 05/27/24 1014   morphine  (PF) 2 MG/ML injection 0.5 mg  0.5 mg Intravenous Q3H PRN Swayze, Ava, DO       pantoprazole  (PROTONIX ) EC tablet  40 mg  40 mg Oral Daily Swayze, Ava, DO   40 mg at 05/27/24 1012   polyethylene glycol (MIRALAX  / GLYCOLAX ) packet 17 g  17 g Oral Daily PRN Swayze, Ava, DO   17 g at 05/27/24 1010   pravastatin  (PRAVACHOL ) tablet 20 mg  20 mg Oral QHS Swayze, Ava, DO   20 mg at 05/26/24 2121   propranolol  (INDERAL ) tablet 40 mg  40 mg Oral BID Swayze, Ava, DO   40 mg at 05/27/24 1013   vancomycin  (VANCOREADY) IVPB 1250 mg/250 mL  1,250 mg Intravenous Q24H Elesa Perkins, RPH 166.7 mL/hr at 05/27/24 1240 1,250 mg at 05/27/24 1240     Discharge Medications: Please see discharge summary for a list of discharge medications.  Relevant Imaging Results:  Relevant Lab Results:   Additional Information [id number]  [place] L Brailey [place], [place]

## 2024-05-27 NOTE — Plan of Care (Signed)
  Problem: Education: Goal: Knowledge of General Education information will improve Description: Including pain rating scale, medication(s)/side effects and non-pharmacologic comfort measures Outcome: Progressing   Problem: Clinical Measurements: Goal: Will remain free from infection Outcome: Progressing   Problem: Nutrition: Goal: Adequate nutrition will be maintained Outcome: Progressing   Problem: Coping: Goal: Level of anxiety will decrease Outcome: Progressing   Problem: Pain Managment: Goal: General experience of comfort will improve and/or be controlled Outcome: Progressing   Problem: Safety: Goal: Ability to remain free from injury will improve Outcome: Progressing   Problem: Health Behavior/Discharge Planning: Goal: Ability to manage health-related needs will improve Outcome: Not Progressing   Problem: Activity: Goal: Risk for activity intolerance will decrease Outcome: Not Progressing

## 2024-05-27 NOTE — TOC Progression Note (Addendum)
 Transition of Care (TOC) - Progression Note    Patient Details  Name: [name]. MRN: [id number] Date of Birth: Mar 20, 1932  Transition of Care [facility]) CM/SW Contact  [place] Prudie [place], [place] Phone Number: 05/27/2024, 4:20 PM  Clinical Narrative:     CSW started auth with [employer]. Family accepted bed at [facility].        Expected Discharge Plan and Services                                               Social Determinants of Health (SDOH) Interventions SDOH Screenings   Food Insecurity: No Food Insecurity (05/24/2024)  Housing: Low Risk  (05/24/2024)  Transportation Needs: No Transportation Needs (05/24/2024)  Utilities: Not At Risk (05/24/2024)  Financial Resource Strain: Patient Declined (11/06/2023)   Received from [facility]  Social Connections: Moderately Integrated (05/24/2024)  Tobacco Use: Low Risk  (05/24/2024)    Readmission Risk Interventions     No data to display

## 2024-05-27 NOTE — Progress Notes (Signed)
 Physical Therapy Treatment Patient Details Name: [name]. MRN: [id number] DOB: October 03, 1932 Today's Date: 05/27/2024   History of Present Illness Pt is an 89 y.o. right-handed male with a past medical history significant for ITP, HTN, HLD, and prostate cancer who presents with concern for right hand infection.  MD assessment includes right 5th finger flexor tenosynovitis and pt is now s/p R 5th finger flexor tendon irrigation and debridement.    PT Comments  Pt and wife stated he had a rough night.  Gets to EOB with min a x 1 and bed features.  Cues and increased time.  Stands pulling up on walker and needs max cues throughout session for proper hand placements.  Upon standing and taking step L knee buckles and he needs assist to prevent fall.  Several other marches and LE ex and LLE continues to buckle and pt reports pain in groin.  He does want to try to walk and +2 is called for chair follow for safety.  He is able to complete lap but gait quality is poor,  cues for navigation and pt with some forward lean today.  Discussed at length with wife who does voice fear of him falling upon discharge.  Agree with her concerns.  He has had frequent falls at home and at times has fallen with him and become injured himself.  She stated his gait is unsteady at baseline but this is worse than normal.  Discharge recommendations changed to <3 hrs of therapy a day upon discharge to increase strength, safety and mobility prior to discharge home.  Family may need to consider increased supports at home after rehab stay.   If plan is discharge home, recommend the following: A little help with walking and/or transfers;A little help with bathing/dressing/bathroom;Assistance with cooking/housework;Direct supervision/assist for medications management;Supervision due to cognitive status;Assist for transportation;Help with stairs or ramp for entrance   Can travel by private vehicle        Equipment  Recommendations  Rolling walker (2 wheels)    Recommendations for Other Services       Precautions / Restrictions Precautions Precautions: Fall Recall of Precautions/Restrictions: Impaired Restrictions Weight Bearing Restrictions Per Provider Order: Yes RUE Weight Bearing Per Provider Order: Weight bearing as tolerated Other Position/Activity Restrictions: WBAT to R hand per Dr. Tobie     Mobility  Bed Mobility Overal bed mobility: Needs Assistance Bed Mobility: Supine to Sit     Supine to sit: Min assist       Patient Response: Cooperative  Transfers Overall transfer level: Needs assistance Equipment used: Rolling walker (2 wheels) Transfers: Sit to/from Stand Sit to Stand: Min assist           General transfer comment: max a cues    Ambulation/Gait Ambulation/Gait assistance: Min assist Gait Distance (Feet): 200 Feet Assistive device: Rolling walker (2 wheels) Gait Pattern/deviations: Step-through pattern, Decreased step length - right, Decreased step length - left Gait velocity: decreased     General Gait Details: buckling LLE today initially which would have resulted in fall without intervention.  it does improve with seated and standing ex with time but gait remains precarious   Optometrist     Tilt Bed Tilt Bed Patient Response: Cooperative  Modified Rankin (Stroke Patients Only)       Balance Overall balance assessment: Needs assistance, History of Falls Sitting-balance support: No upper extremity supported Sitting balance-Leahy Scale: Good  Standing balance support: Bilateral upper extremity supported, During functional activity Standing balance-Leahy Scale: Poor Standing balance comment: RW for support                            Communication Communication Factors Affecting Communication: Hearing impaired  Cognition Arousal: Alert Behavior During Therapy: WFL for tasks  assessed/performed, Impulsive   PT - Cognitive impairments: Memory, Awareness                       PT - Cognition Comments: cues for safety and awarenss Following commands: Impaired Following commands impaired: Follows one step commands with increased time, Follows one step commands inconsistently    Cueing Cueing Techniques: Verbal cues, Visual cues  Exercises Other Exercises Other Exercises: seated and standing AROM    General Comments        Pertinent Vitals/Pain Pain Assessment Pain Assessment: Faces Faces Pain Scale: Hurts even more Pain Location: L hip at groin Pain Descriptors / Indicators: Sore, Discomfort, Grimacing Pain Intervention(s): Limited activity within patient's tolerance, Monitored during session, Repositioned    Home Living                          Prior Function            PT Goals (current goals can now be found in the care plan section) Progress towards PT goals: Not progressing toward goals - comment    Frequency    Min 2X/week      PT Plan      Co-evaluation              AM-PAC PT 6 Clicks Mobility   Outcome Measure  Help needed turning from your back to your side while in a flat bed without using bedrails?: A Little Help needed moving from lying on your back to sitting on the side of a flat bed without using bedrails?: A Little Help needed moving to and from a bed to a chair (including a wheelchair)?: A Little Help needed standing up from a chair using your arms (e.g., wheelchair or bedside chair)?: A Little Help needed to walk in hospital room?: A Little Help needed climbing 3-5 steps with a railing? : A Lot 6 Click Score: 17    End of Session Equipment Utilized During Treatment: Gait belt Activity Tolerance: Patient tolerated treatment well;Patient limited by pain   Nurse Communication: Mobility status PT Visit Diagnosis: History of falling (Z91.81);Muscle weakness (generalized) (M62.81);Difficulty  in walking, not elsewhere classified (R26.2)     Time: 9075-9049 PT Time Calculation (min) (ACUTE ONLY): 26 min  Charges:    $Gait Training: 8-22 mins $Therapeutic Exercise: 8-22 mins PT General Charges $$ ACUTE PT VISIT: 1 Visit                    Lauraine Gills, PTA 05/27/24, 10:03 AM

## 2024-05-27 NOTE — Progress Notes (Signed)
 Occupational Therapy Treatment Patient Details Name: [name]. MRN: [id number] DOB: 1932-05-08 Today's Date: 05/27/2024   History of present illness Pt is an 89 y.o. right-handed male with a past medical history significant for ITP, HTN, HLD, and prostate cancer who presents with concern for right hand infection.  MD assessment includes right 5th finger flexor tenosynovitis and pt is now s/p R 5th finger flexor tendon irrigation and debridement.   OT comments  Pt seen for OT treatment on this date. Upon arrival to room pt asleep in bed, awoke when therapist entered bedroom, agreeable to tx. Pt requires Min A with increased time for bed mobility with cuing for technique using BUEs.  Sitting EOB facilitated LB clothing management (socks) with posterior leaning noted when lifting leg.  Grooming tasks (face washing) completed while seated with SBA using LUE only.  Sit to stand transfers to RW with Min A, lateral stepping at RW level with difficulty cleaning feet to take adequate steps, Max cuing throughout. Returned to supine with HOB elevated with Max A.  Repositioned in bed with HOB elevated, call button within reach, and bed alarm activated. Pt making good progress toward goals, will continue to follow POC.       If plan is discharge home, recommend the following:  A little help with bathing/dressing/bathroom;Assistance with cooking/housework;Assist for transportation;Help with stairs or ramp for entrance;A little help with walking and/or transfers   Equipment Recommendations       Recommendations for Other Services      Precautions / Restrictions Precautions Precautions: Fall Recall of Precautions/Restrictions: Impaired Restrictions Weight Bearing Restrictions Per Provider Order: Yes RUE Weight Bearing Per Provider Order: Weight bearing as tolerated Other Position/Activity Restrictions: WBAT to R hand per Dr. Tobie       Mobility Bed Mobility Overal bed mobility: Needs  Assistance Bed Mobility: Supine to Sit     Supine to sit: Min assist          Transfers Overall transfer level: Needs assistance Equipment used: Rolling walker (2 wheels) Transfers: Sit to/from Stand Sit to Stand: Min assist           General transfer comment: max cuing for safety and proficiency with technique     Balance Overall balance assessment: Needs assistance, History of Falls Sitting-balance support: Single extremity supported Sitting balance-Leahy Scale: Fair Sitting balance - Comments: sitting balance assessed when managing socks while seated   Standing balance support: Bilateral upper extremity supported, During functional activity Standing balance-Leahy Scale: Poor Standing balance comment: RW for support                           ADL either performed or assessed with clinical judgement   ADL Overall ADL's : Needs assistance/impaired     Grooming: Wash/dry face;Supervision/safety;Sitting Grooming Details (indicate cue type and reason): with LUE only             Lower Body Dressing: Moderate assistance Lower Body Dressing Details (indicate cue type and reason): to manage bilateral socks while seated EOB                    Extremity/Trunk Assessment Upper Extremity Assessment Upper Extremity Assessment: Generalized weakness RUE Deficits / Details: 5th finger bandaged, able to WB through RW RUE Coordination: decreased fine motor            Vision       Perception     Praxis  Communication Communication Factors Affecting Communication: Hearing impaired   Cognition Arousal: Alert Behavior During Therapy: WFL for tasks assessed/performed, Impulsive               OT - Cognition Comments: Decreased safety awareness, increased processing/problem solving time                 Following commands: Impaired Following commands impaired: Follows one step commands with increased time, Follows one step commands  inconsistently      Cueing   Cueing Techniques: Verbal cues, Visual cues  Exercises Other Exercises Other Exercises: education on technique  for functional transfers and LB dressing task engagement    Shoulder Instructions       General Comments      Pertinent Vitals/ Pain       Pain Assessment Pain Assessment: No/denies pain Pain Location: L hip at groin Pain Descriptors / Indicators: Sore, Discomfort, Grimacing  Home Living                                          Prior Functioning/Environment              Frequency  Min 2X/week        Progress Toward Goals  OT Goals(current goals can now be found in the care plan section)  Progress towards OT goals: Progressing toward goals     Plan      Co-evaluation                 AM-PAC OT 6 Clicks Daily Activity     Outcome Measure   Help from another person eating meals?: None Help from another person taking care of personal grooming?: A Little Help from another person toileting, which includes using toliet, bedpan, or urinal?: A Lot Help from another person bathing (including washing, rinsing, drying)?: A Little Help from another person to put on and taking off regular upper body clothing?: A Little Help from another person to put on and taking off regular lower body clothing?: A Lot 6 Click Score: 17    End of Session Equipment Utilized During Treatment: Gait belt;Rolling walker (2 wheels)  OT Visit Diagnosis: Other abnormalities of gait and mobility (R26.89);Repeated falls (R29.6);Muscle weakness (generalized) (M62.81)   Activity Tolerance Patient tolerated treatment well   Patient Left in bed;with call bell/phone within reach;with bed alarm set;with nursing/sitter in room;with family/visitor present (Patient's spouse and CNA/NT in room at end of session.)   Nurse Communication          Time: 8580-8546 OT Time Calculation (min): 34 min  Charges: OT General Charges $OT  Visit: 1 Visit  [name] OTR/L   [name] 05/27/2024, 3:05 PM

## 2024-05-28 DIAGNOSIS — M65949 Unspecified synovitis and tenosynovitis, unspecified hand: Secondary | ICD-10-CM | POA: Diagnosis not present

## 2024-05-28 LAB — BASIC METABOLIC PANEL WITH GFR
Anion gap: 8 (ref 5–15)
BUN: 17 mg/dL (ref 8–23)
CO2: 24 mmol/L (ref 22–32)
Calcium: 8.4 mg/dL — ABNORMAL LOW (ref 8.9–10.3)
Chloride: 97 mmol/L — ABNORMAL LOW (ref 98–111)
Creatinine, Ser: 0.8 mg/dL (ref 0.61–1.24)
GFR, Estimated: >60 mL/min (ref >60)
Glucose, Bld: 105 mg/dL — ABNORMAL HIGH (ref 70–99)
Potassium: 3.5 mmol/L (ref 3.5–5.1)
Sodium: 129 mmol/L — ABNORMAL LOW (ref 135–145)

## 2024-05-28 LAB — CBC WITH DIFFERENTIAL/PLATELET
Abs Immature Granulocytes: 0.08 K/uL — ABNORMAL HIGH (ref 0.00–0.07)
Basophils Absolute: 0 K/uL (ref 0.0–0.1)
Basophils Relative: 0 %
Eosinophils Absolute: 0 K/uL (ref 0.0–0.5)
Eosinophils Relative: 0 %
HCT: 35.6 % — ABNORMAL LOW (ref 39.0–52.0)
Hemoglobin: 12.3 g/dL — ABNORMAL LOW (ref 13.0–17.0)
Immature Granulocytes: 1 %
Lymphocytes Relative: 8 %
Lymphs Abs: 1.1 K/uL (ref 0.7–4.0)
MCH: 34.2 pg — ABNORMAL HIGH (ref 26.0–34.0)
MCHC: 34.6 g/dL (ref 30.0–36.0)
MCV: 98.9 fL (ref 80.0–100.0)
Monocytes Absolute: 1.4 K/uL — ABNORMAL HIGH (ref 0.1–1.0)
Monocytes Relative: 10 %
Neutro Abs: 11.7 K/uL — ABNORMAL HIGH (ref 1.7–7.7)
Neutrophils Relative %: 81 %
Platelets: 258 K/uL (ref 150–400)
RBC: 3.6 MIL/uL — ABNORMAL LOW (ref 4.22–5.81)
RDW: 13.4 % (ref 11.5–15.5)
WBC: 14.2 K/uL — ABNORMAL HIGH (ref 4.0–10.5)
nRBC: 0 % (ref 0.0–0.2)

## 2024-05-28 MED ORDER — MUSCLE RUB 10-15 % EX CREA
TOPICAL_CREAM | CUTANEOUS | Status: DC | PRN
  Administered 2024-05-28: 1 via TOPICAL
  Filled 2024-05-28: qty 85

## 2024-05-28 MED ORDER — ENOXAPARIN SODIUM 40 MG/0.4ML IJ SOSY
40.0000 mg | PREFILLED_SYRINGE | INTRAMUSCULAR | Status: DC
  Administered 2024-05-29 – 2024-05-30 (×2): 40 mg via SUBCUTANEOUS
  Filled 2024-05-28 (×3): qty 0.4

## 2024-05-28 NOTE — Hospital Course (Addendum)
 Hospital course / significant events:   HPI: [name]. is a 89 y.o. right-handed male with a past medical history significant for ITP, HTN, HLD, and prostate cancer who presents with concern for right hand infection.   07/05: admitted to hospitalist service, underwent Right 5th finger flexor tendon irrigation and debridement  07/07-07/08: abx, await cultures, await SNF placement  07/09: leukocytosis and hyponatremia, monitoring, still awaiting SNF  07/10: persistent leukocytosis and hyponatremia both stable from yesterday. R foot pain, XR pending but benign exam. CXR and UA pending as well as peripheral smear addeed to w/u leukocytosis, Urine osm/Na and Serum osm pending to w/u hyponatremia 07/11: Workup negative for hyponatremia, sodium level stable, WBC improving, patient reports feeling pretty good today.  Here to peer completed, insurance still denying SNF, await PT eval today and based on that wife will make decision regarding appeal the denial, we will work on setting up home health/DME     Consultants:  Orthopedics   Procedures/Surgeries: Right 5th finger flexor tendon irrigation and debridement       ASSESSMENT & PLAN:   Tenosynovitis of right fifth finger Patient underwent irrigation of the 5th finger flexor tendon and debridement by orthopedic surgeon on 05/24/2024  Transition to p.o. antibiotics  R foot pain Benign exam and x-rays, pain control and continue PT  Leukocytosis - worsening 05/28/24 and stable into 05/29/24 improving into 05/30/2024 No apparent worsening or new infection Monitor CBC  Hyponatremia  Appears euvolemic  Liberalize salt intake - dc cardiac diet  Serum Osm, Urine Osm, Urine Na unrevealing, repeat as necessary  Constipation Bowel regimen  Uncontrolled hypertension - improved  Lisinopril  20 mg daily and propranolol  40 mg daily. Amlodipine  added given uncontrolled hypertension   Pure hypercholesterolemia Continue pravachol  20 mg  at bedtime.    Eye globe prosthesis Left eye.   History of ITP Platelet currently stable.  Outpatient follow-up    overweight based on BMI: Body mass index is 26.16 kg/m.[name] Significantly low or high BMI is associated with higher medical risk.  Underweight - under 18  overweight - 25 to 29 obese - 30 or more Class 1 obesity: BMI of 30.0 to 34 Class 2 obesity: BMI of 35.0 to 39 Class 3 obesity: BMI of 40.0 to 49 Super Morbid Obesity: BMI 50-59 Super-super Morbid Obesity: BMI 60+ Healthy nutrition and physical activity advised as adjunct to other disease management and risk reduction treatments    DVT prophylaxis: lovenox  IV fluids: no continuous IV fluids  Nutrition: regular diet Central lines / other devices: none  Code Status: FULL CODE ACP documentation reviewed:  on file in [facility]  [facility] needs: Home health/DME Medical barriers to dispo: None

## 2024-05-28 NOTE — TOC Progression Note (Signed)
 Transition of Care (TOC) - Progression Note    Patient Details  Name: [name]. MRN: [id number] Date of Birth: 08/16/1932  Transition of Care [facility]) CM/SW Contact  [place], [place] Phone Number: 05/28/2024, 12:01 PM  Clinical Narrative:     CSW received secure chat regarding patient. Recommendation changed to SNF. Spouse does not feel comfortable taking patient home.   CSW met with spouse to discuss choice and recommendations. Spouse provided preferred choices for SNF. Brief assessment completed. Pt was not oriented when CSW was in the room.        Expected Discharge Plan and Services                                               Social Determinants of Health (SDOH) Interventions SDOH Screenings   Food Insecurity: No Food Insecurity (05/24/2024)  Housing: Low Risk  (05/24/2024)  Transportation Needs: No Transportation Needs (05/24/2024)  Utilities: Not At Risk (05/24/2024)  Financial Resource Strain: Patient Declined (11/06/2023)   Received from [facility]  Social Connections: Moderately Integrated (05/24/2024)  Tobacco Use: Low Risk  (05/24/2024)    Readmission Risk Interventions     No data to display

## 2024-05-28 NOTE — Plan of Care (Signed)

## 2024-05-28 NOTE — Progress Notes (Signed)
 PROGRESS NOTE    [name].   FMW:[id number] DOB: 07-16-1932  DOA: 05/24/2024 Date of Service: 05/28/24 which is hospital day 3  PCP: [name], MD    Hospital course / significant events:   HPI: Keith Lave. is a 89 y.o. right-handed male with a past medical history significant for ITP, HTN, HLD, and prostate cancer who presents with concern for right hand infection.   07/05: admitted to hospitalist service, underwent Right 5th finger flexor tendon irrigation and debridement  07/07-07/08: abx, await cultures, await SNF placement  07/09: leukocytosis and hyponatremia, monitoring, still awaiting SNF      Consultants:  Orthopedics   Procedures/Surgeries: Right 5th finger flexor tendon irrigation and debridement       ASSESSMENT & PLAN:   Tenosynovitis of right fifth finger Patient underwent irrigation of the 5th finger flexor tendon and debridement by orthopedic surgeon on 05/24/2024  Continue antibiotics  According to orthopedics patient can potentially be discharged on Augmentin    Leukocytosis - worsening 05/28/24  No apparent worsening or new infection Monitor CBC Continue on abx  Hyponatremia  Appears euvolemic  Liberalize salt intake - dc cardiac diet  Repeat labs, workup if persistent   Uncontrolled hypertension - improved  Lisinopril  20 mg daily and propranolol  40 mg daily. Amlodipine  added given uncontrolled hypertension   Pure hypercholesterolemia Continue pravachol  20 mg at bedtime.    Eye globe prosthesis Left eye.   History of ITP Platelet currently stable.  Outpatient follow-up    overweight based on BMI: Body mass index is 26.16 kg/m.[name] Significantly low or high BMI is associated with higher medical risk.  Underweight - under 18  overweight - 25 to 29 obese - 30 or more Class 1 obesity: BMI of 30.0 to 34 Class 2 obesity: BMI of 35.0 to 39 Class 3 obesity: BMI of 40.0 to 49 Super Morbid Obesity: BMI  50-59 Super-super Morbid Obesity: BMI 60+ Healthy nutrition and physical activity advised as adjunct to other disease management and risk reduction treatments    DVT prophylaxis: lovenox  IV fluids: no continuous IV fluids  Nutrition: regular diet Central lines / other devices: none  Code Status: FULL CODE ACP documentation reviewed:  on file in [facility]  TOC needs: SNF placement Medical barriers to dispo: hyponatremia and leukocytosis. Expected medical readiness for discharge pending lab improvement.              Subjective / Brief ROS:  Patient reports no concerns right now Denies CP/SOB.  Pain controlled.  Denies new weakness.  Tolerating diet.  Reports no concerns w/ urination/defecation.   Family Communication: family at bedside on rounds    Objective Findings:  Vitals:   05/27/24 2105 05/28/24 0425 05/28/24 0802 05/28/24 1608  BP: (!) 156/70 130/75 (!) 149/72 (!) 142/77  Pulse: 71 79 72 70  Resp:  16 16 16   Temp:  98.4 F (36.9 C) 98.4 F (36.9 C) 97.9 F (36.6 C)  TempSrc:      SpO2:  94% 94% 98%  Weight:      Height:        Intake/Output Summary (Last 24 hours) at 05/28/2024 1753 Last data filed at 05/28/2024 1431 Gross per 24 hour  Intake 760 ml  Output 1200 ml  Net -440 ml   Filed Weights   05/24/24 1115  Weight: 75.8 kg    Examination:  Physical Exam Cardiovascular:     Rate and Rhythm: Normal rate and regular rhythm.  Pulmonary:  Effort: Pulmonary effort is normal.     Breath sounds: Normal breath sounds.  Abdominal:     General: Bowel sounds are normal.     Palpations: Abdomen is soft.  Skin:    General: Skin is warm and dry.     Comments: Bandage in place on R hand was not disturbed, NV itnact distal to bandage   Neurological:     Mental Status: He is alert. Mental status is at baseline.  Psychiatric:        Mood and Affect: Mood normal.        Behavior: Behavior normal.          Scheduled Medications:    amLODipine   5 mg Oral Daily   citalopram   20 mg Oral Daily   dorzolamide   1 drop Right Eye BID   enoxaparin  (LOVENOX ) injection  40 mg Subcutaneous Q24H   latanoprost   1 drop Right Eye QHS   lisinopril   20 mg Oral Daily   pantoprazole   40 mg Oral Daily   pravastatin   20 mg Oral QHS   propranolol   40 mg Oral BID    Continuous Infusions:  ceFEPime  (MAXIPIME ) IV 2 g (05/28/24 1004)    PRN Medications:  acetaminophen  **OR** acetaminophen , alum & mag hydroxide-simeth, bisacodyl , morphine  injection, Muscle Rub, polyethylene glycol  Antimicrobials from admission:  Anti-infectives (From admission, onward)    Start     Dose/Rate Route Frequency Ordered Stop   05/25/24 1300  vancomycin  (VANCOREADY) IVPB 1250 mg/250 mL  Status:  Discontinued        1,250 mg 166.7 mL/hr over 90 Minutes Intravenous Every 24 hours 05/24/24 2122 05/27/24 1355   05/24/24 2215  ceFEPIme  (MAXIPIME ) 2 g in sodium chloride  0.9 % 100 mL IVPB        2 g 200 mL/hr over 30 Minutes Intravenous Every 12 hours 05/24/24 2122     05/24/24 2200  vancomycin  (VANCOCIN ) IVPB 1000 mg/200 mL premix  Status:  Discontinued        1,000 mg 200 mL/hr over 60 Minutes Intravenous  Once 05/24/24 2103 05/24/24 2105   05/24/24 2200  ceFEPIme  (MAXIPIME ) 2 g in sodium chloride  0.9 % 100 mL IVPB  Status:  Discontinued        2 g 200 mL/hr over 30 Minutes Intravenous  Once 05/24/24 2103 05/24/24 2122   05/24/24 1615  ceFAZolin  (ANCEF ) IVPB 2g/100 mL premix  Status:  Discontinued        2 g 200 mL/hr over 30 Minutes Intravenous  Once 05/24/24 1601 05/24/24 1858   05/24/24 1230  vancomycin  (VANCOREADY) IVPB 1500 mg/300 mL  Status:  Discontinued        1,500 mg 150 mL/hr over 120 Minutes Intravenous  Once 05/24/24 1203 05/24/24 1217   05/24/24 1230  vancomycin  (VANCOREADY) IVPB 1750 mg/350 mL        1,750 mg 175 mL/hr over 120 Minutes Intravenous  Once 05/24/24 1218 05/24/24 1440   05/24/24 1200  Ampicillin -Sulbactam (UNASYN ) 3 g in sodium  chloride 0.9 % 100 mL IVPB        3 g 200 mL/hr over 30 Minutes Intravenous  Once 05/24/24 1153 05/24/24 1220           Data Reviewed:  I have personally reviewed the following...  CBC: Recent Labs  Lab 05/24/24 1156 05/25/24 0513 05/26/24 0344 05/27/24 0505 05/28/24 0352  WBC 10.9* 9.8 13.3* 13.7* 14.2*  NEUTROABS 8.5*  --  9.9* 10.8* 11.7*  HGB 12.3* 13.1  12.0* 12.9* 12.3*  HCT 36.0* 38.4* 35.0* 37.9* 35.6*  MCV 100.0 98.0 98.9 100.8* 98.9  PLT 276 270 273 276 258   Basic Metabolic Panel: Recent Labs  Lab 05/24/24 1156 05/25/24 0513 05/26/24 0344 05/27/24 0505 05/28/24 0352  NA 135 133* 134* 132* 129*  K 3.9 3.8 3.4* 3.8 3.5  CL 104 102 103 98 97*  CO2 25 23 23 25 24   GLUCOSE 98 151* 101* 105* 105*  BUN 15 13 20 18 17   CREATININE 0.90 0.81 0.90 0.70 0.80  CALCIUM 8.9 8.8* 8.6* 8.7* 8.4*   GFR: Estimated Creatinine Clearance: 58.5 mL/min (by C-G formula based on SCr of 0.8 mg/dL). Liver Function Tests: Recent Labs  Lab 05/25/24 0513  AST 21  ALT 21  ALKPHOS 91  BILITOT 0.7  PROT 7.0  ALBUMIN 3.4*   No results for input(s): LIPASE, AMYLASE in the last 168 hours. No results for input(s): AMMONIA in the last 168 hours. Coagulation Profile: No results for input(s): INR, PROTIME in the last 168 hours. Cardiac Enzymes: No results for input(s): CKTOTAL, CKMB, CKMBINDEX, TROPONINI in the last 168 hours. BNP (last 3 results) No results for input(s): PROBNP in the last 8760 hours. HbA1C: No results for input(s): HGBA1C in the last 72 hours. CBG: Recent Labs  Lab 05/26/24 2138  GLUCAP 120*   Lipid Profile: No results for input(s): CHOL, HDL, LDLCALC, TRIG, CHOLHDL, LDLDIRECT in the last 72 hours. Thyroid  Function Tests: No results for input(s): TSH, T4TOTAL, FREET4, T3FREE, THYROIDAB in the last 72 hours. Anemia Panel: No results for input(s): VITAMINB12, FOLATE, FERRITIN, TIBC, IRON,  RETICCTPCT in the last 72 hours. Most Recent Urinalysis On File:     Component Value Date/Time   COLORURINE STRAW (A) 08/24/2015 1045   APPEARANCEUR CLEAR (A) 08/24/2015 1045   APPEARANCEUR Clear 12/08/2014 1450   LABSPEC 1.004 (L) 08/24/2015 1045   LABSPEC 1.043 12/08/2014 1450   PHURINE 8.0 08/24/2015 1045   GLUCOSEU NEGATIVE 08/24/2015 1045   GLUCOSEU Negative 12/08/2014 1450   HGBUR NEGATIVE 08/24/2015 1045   BILIRUBINUR NEGATIVE 08/24/2015 1045   BILIRUBINUR Negative 12/08/2014 1450   KETONESUR NEGATIVE 08/24/2015 1045   PROTEINUR NEGATIVE 08/24/2015 1045   NITRITE NEGATIVE 08/24/2015 1045   LEUKOCYTESUR NEGATIVE 08/24/2015 1045   LEUKOCYTESUR Negative 12/08/2014 1450   Sepsis Labs: @LABRCNTIP (procalcitonin:4,lacticidven:4) Microbiology: Recent Results (from the past 240 hours)  Aerobic/Anaerobic Culture w Gram Stain (surgical/deep wound)     Status: None (Preliminary result)   Collection Time: 05/24/24  5:07 PM   Specimen: Wound; Abscess  Result Value Ref Range Status   Specimen Description   Final    WOUND Performed at [facility], [address], [place], [place] [address]    Special Requests   Final    NONE Performed at [facility], [address] [place], [place] [address]    Gram Stain   Final    RARE WBC PRESENT, PREDOMINANTLY PMN NO ORGANISMS SEEN    Culture   Final    NO GROWTH 4 DAYS NO ANAEROBES ISOLATED; CULTURE IN PROGRESS FOR 5 DAYS Performed at [facility], [address] [place], [place] [address]    Report Status PENDING  Incomplete  Aerobic/Anaerobic Culture w Gram Stain (surgical/deep wound)     Status: None (Preliminary result)   Collection Time: 05/24/24  5:12 PM   Specimen: Wound; Abscess  Result Value Ref Range Status   Specimen Description   Final    WOUND Performed at  [facility], [address] [place], [place] [address]    Special Requests   Final    NONE Performed at [facility], [address] [place], [place] [address]    Gram Stain   Final    RARE WBC PRESENT, PREDOMINANTLY PMN NO ORGANISMS SEEN    Culture   Final    NO GROWTH 4 DAYS NO ANAEROBES ISOLATED; CULTURE IN PROGRESS FOR 5 DAYS Performed at [facility], [address], [place], [place] [address]    Report Status PENDING  Incomplete  Culture, blood (Routine X 2) w Reflex to ID Panel     Status: None (Preliminary result)   Collection Time: 05/24/24 10:01 PM   Specimen: BLOOD  Result Value Ref Range Status   Specimen Description BLOOD BLOOD RIGHT ARM  Final   Special Requests   Final    BOTTLES DRAWN AEROBIC AND ANAEROBIC Blood Culture results may not be optimal due to an inadequate volume of blood received in culture bottles   Culture   Final    NO GROWTH 4 DAYS Performed at [facility], [address] [place], [place] [address]    Report Status PENDING  Incomplete  Culture, blood (Routine X 2) w Reflex to ID Panel     Status: None (Preliminary result)   Collection Time: 05/24/24 10:01 PM   Specimen: BLOOD  Result Value Ref Range Status   Specimen Description BLOOD BLOOD RIGHT HAND  Final   Special Requests   Final    BOTTLES DRAWN AEROBIC AND ANAEROBIC Blood Culture results may not be optimal due to an inadequate volume of blood received in culture bottles   Culture   Final    NO GROWTH 4 DAYS Performed at [facility], [address], [place], [place] [address]    Report Status PENDING  Incomplete      Radiology Studies last 3 days: No results found.          Tashona Calk, DO [facility] 05/28/2024, 5:53 PM    Dictation software may have been used to generate the above note. Typos may occur and escape review in typed/dictated notes. Please contact Dr Marsa directly for clarity if needed.  Staff may message me via secure chat in Epic  but this may not receive an immediate response,  please page me for urgent matters!  If  7PM-7AM, please contact night coverage [email or web address]

## 2024-05-28 NOTE — Progress Notes (Signed)
  Subjective: 4 Days Post-Op Procedure(s) (LRB): RIGHT FIFTH FINGER TENDON FLEXOR IRRIGATION AND DEBRIDEMENT (Right) Patient reports pain as mild.  Blood on his bandage this morning. Patient is well, and has had no acute complaints or problems Plan is to go Home after hospital stay. Negative for chest pain and shortness of breath Fever: no Gastrointestinal:Negative for nausea and vomiting this morning. Patient reports he is passing gas this morning.  Objective: Vital signs in last 24 hours: Temp:  [97.7 F (36.5 C)-98.4 F (36.9 C)] 98.4 F (36.9 C) (07/09 0425) Pulse Rate:  [69-81] 79 (07/09 0425) Resp:  [16-17] 16 (07/09 0425) BP: (128-163)/(66-87) 130/75 (07/09 0425) SpO2:  [94 %-100 %] 94 % (07/09 0425)  Intake/Output from previous day:  Intake/Output Summary (Last 24 hours) at 05/28/2024 0728 Last data filed at 05/27/2024 1300 Gross per 24 hour  Intake 600 ml  Output 1000 ml  Net -400 ml    Intake/Output this shift: No intake/output data recorded.  Labs: Recent Labs    05/26/24 0344 05/27/24 0505 05/28/24 0352  HGB 12.0* 12.9* 12.3*   Recent Labs    05/27/24 0505 05/28/24 0352  WBC 13.7* 14.2*  RBC 3.76* 3.60*  HCT 37.9* 35.6*  PLT 276 258   Recent Labs    05/27/24 0505 05/28/24 0352  NA 132* 129*  K 3.8 3.5  CL 98 97*  CO2 25 24  BUN 18 17  CREATININE 0.70 0.80  GLUCOSE 105* 105*  CALCIUM 8.7* 8.4*   No results for input(s): LABPT, INR in the last 72 hours.   EXAM General - Patient is Alert, Appropriate, and Oriented Extremity - Dressing applied to the right hand this morning. Dressing removed this morning, secondary to bleeding from the bandage.  Removal of the current bandage with mild bleeding from the proximal wound.  Does not appear that the suture has popped.  New bulky bandages applied with mild compression. Tenderness improved over the volar aspect of the finger. Intact sensation to light touch. New dressing applied.    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/01/2015   Leukocytosis    Prostate cancer (HCC)    Shingles     Assessment/Plan: 4 Days Post-Op Procedure(s) (LRB): RIGHT FIFTH FINGER TENDON FLEXOR IRRIGATION AND DEBRIDEMENT (Right) Principal Problem:   Tenosynovitis of finger and hand Active Problems:   Uncontrolled hypertension   Drug-induced autoimmune hemolytic anemia (HCC)   Cancer of prostate w/low recurrence risk (T1-2a, Gleason<7 & PSA<10) (HCC)   Eye globe prosthesis   History of ITP   Pure hypercholesterolemia   Tenosynovitis of finger  Estimated body mass index is 26.16 kg/m as calculated from the following:   Height as of this encounter: 5' 7 (1.702 m).   Weight as of this encounter: 75.8 kg. Advance diet Up with therapy D/C IV fluids when tolerating po intake.  Labs and vitals reviewed.  WBC down to 9.8.  Hg 13.1. Swelling and erythema both improved.  New dressing applied. Cultures from surgery are pending at this time. Continue IV Vancomycin  and Maxipime . PT/OT eval.  DVT Prophylaxis - TED hose Activities as tolerated with the right hand.  Dressing change as needed.  Krystal Doyne [facility] 05/28/2024, 7:28 AM

## 2024-05-29 ENCOUNTER — Inpatient Hospital Stay

## 2024-05-29 DIAGNOSIS — M65949 Unspecified synovitis and tenosynovitis, unspecified hand: Secondary | ICD-10-CM | POA: Diagnosis not present

## 2024-05-29 LAB — CBC WITH DIFFERENTIAL/PLATELET
Abs Immature Granulocytes: 0.08 K/uL — ABNORMAL HIGH (ref 0.00–0.07)
Basophils Absolute: 0 K/uL (ref 0.0–0.1)
Basophils Relative: 0 %
Eosinophils Absolute: 0.1 K/uL (ref 0.0–0.5)
Eosinophils Relative: 0 %
HCT: 34.8 % — ABNORMAL LOW (ref 39.0–52.0)
Hemoglobin: 12 g/dL — ABNORMAL LOW (ref 13.0–17.0)
Immature Granulocytes: 1 %
Lymphocytes Relative: 9 %
Lymphs Abs: 1.4 K/uL (ref 0.7–4.0)
MCH: 33.7 pg (ref 26.0–34.0)
MCHC: 34.5 g/dL (ref 30.0–36.0)
MCV: 97.8 fL (ref 80.0–100.0)
Monocytes Absolute: 1.4 K/uL — ABNORMAL HIGH (ref 0.1–1.0)
Monocytes Relative: 10 %
Neutro Abs: 11.8 K/uL — ABNORMAL HIGH (ref 1.7–7.7)
Neutrophils Relative %: 80 %
Platelets: 273 K/uL (ref 150–400)
RBC: 3.56 MIL/uL — ABNORMAL LOW (ref 4.22–5.81)
RDW: 13.6 % (ref 11.5–15.5)
WBC: 14.7 K/uL — ABNORMAL HIGH (ref 4.0–10.5)
nRBC: 0 % (ref 0.0–0.2)

## 2024-05-29 LAB — BASIC METABOLIC PANEL WITH GFR
Anion gap: 8 (ref 5–15)
BUN: 18 mg/dL (ref 8–23)
CO2: 22 mmol/L (ref 22–32)
Calcium: 8.5 mg/dL — ABNORMAL LOW (ref 8.9–10.3)
Chloride: 100 mmol/L (ref 98–111)
Creatinine, Ser: 0.75 mg/dL (ref 0.61–1.24)
GFR, Estimated: >60 mL/min (ref >60)
Glucose, Bld: 106 mg/dL — ABNORMAL HIGH (ref 70–99)
Potassium: 3.7 mmol/L (ref 3.5–5.1)
Sodium: 130 mmol/L — ABNORMAL LOW (ref 135–145)

## 2024-05-29 LAB — URINALYSIS, COMPLETE (UACMP) WITH MICROSCOPIC
Bilirubin Urine: NEGATIVE
Glucose, UA: NEGATIVE mg/dL
Hgb urine dipstick: NEGATIVE
Ketones, ur: NEGATIVE mg/dL
Leukocytes,Ua: NEGATIVE
Nitrite: NEGATIVE
Protein, ur: 30 mg/dL — AB
Specific Gravity, Urine: 1.017 (ref 1.005–1.030)
pH: 5 (ref 5.0–8.0)

## 2024-05-29 LAB — AEROBIC/ANAEROBIC CULTURE W GRAM STAIN (SURGICAL/DEEP WOUND)
Culture: NO GROWTH
Culture: NO GROWTH

## 2024-05-29 LAB — TECHNOLOGIST SMEAR REVIEW: Plt Morphology: ADEQUATE

## 2024-05-29 LAB — SODIUM, URINE, RANDOM: Sodium, Ur: 31 mmol/L

## 2024-05-29 LAB — OSMOLALITY: Osmolality: 277 mosm/kg (ref 275–295)

## 2024-05-29 LAB — OSMOLALITY, URINE: Osmolality, Ur: 506 mosm/kg (ref 300–900)

## 2024-05-29 MED ORDER — SENNOSIDES-DOCUSATE SODIUM 8.6-50 MG PO TABS
2.0000 | ORAL_TABLET | Freq: Once | ORAL | Status: AC
  Administered 2024-05-29: 2 via ORAL
  Filled 2024-05-29: qty 2

## 2024-05-29 MED ORDER — BISACODYL 10 MG RE SUPP
10.0000 mg | Freq: Every day | RECTAL | Status: DC | PRN
  Administered 2024-05-29: 10 mg via RECTAL
  Filled 2024-05-29: qty 1

## 2024-05-29 MED ORDER — FLEET ENEMA RE ENEM: 1.0000 | ENEMA | Freq: Every day | RECTAL | Status: DC | PRN

## 2024-05-29 MED ORDER — POLYETHYLENE GLYCOL 3350 17 G PO PACK
17.0000 g | PACK | Freq: Two times a day (BID) | ORAL | Status: AC
  Administered 2024-05-29 – 2024-05-30 (×4): 17 g via ORAL
  Filled 2024-05-29 (×4): qty 1

## 2024-05-29 NOTE — Progress Notes (Signed)
 Physical Therapy Treatment Patient Details Name: [name]. MRN: [id number] DOB: 1932/06/18 Today's Date: 05/29/2024   History of Present Illness Pt is an 89 y.o. right-handed male with a past medical history significant for ITP, HTN, HLD, and prostate cancer who presents with concern for right hand infection.  MD assessment includes right 5th finger flexor tenosynovitis and pt is now s/p R 5th finger flexor tendon irrigation and debridement.    PT Comments  Pt received supine in bed sleeping with spouse at bedside. Pt easily awakes and agreeable to participate although he is drowsy. Pt needs modA for supine to sit with mod multi modal cuing for LE and UE hand placement. Pt needs minA +1 to stand relying on RW for support. Pt reports RLE pain leading to 1 episode of LE buckling in stance phase due to pain needing min to modA+1 to correct from PT. Post buckling pt ambulates reciprocally using RW to recliner deferring further mobility due to pain. Pt placed with all needs in reach. D/c recs remain appropriate. Will re-attempt for pm attempt if time allows.    If plan is discharge home, recommend the following: A little help with walking and/or transfers;A little help with bathing/dressing/bathroom;Assistance with cooking/housework;Direct supervision/assist for medications management;Supervision due to cognitive status;Assist for transportation;Help with stairs or ramp for entrance   Can travel by private vehicle     Yes  Equipment Recommendations       Recommendations for Other Services       Precautions / Restrictions Precautions Precautions: Fall Recall of Precautions/Restrictions: Impaired Restrictions Weight Bearing Restrictions Per Provider Order: Yes RUE Weight Bearing Per Provider Order: Weight bearing as tolerated Other Position/Activity Restrictions: WBAT to R hand per Dr. Tobie     Mobility  Bed Mobility Overal bed mobility: Needs Assistance Bed Mobility: Supine to  Sit     Supine to sit: Mod assist, HOB elevated, Used rails       Patient Response: Cooperative  Transfers Overall transfer level: Needs assistance Equipment used: Rolling walker (2 wheels) Transfers: Sit to/from Stand Sit to Stand: Min assist                Ambulation/Gait Ambulation/Gait assistance: Contact guard assist Gait Distance (Feet): 15 Feet Assistive device: Rolling walker (2 wheels) Gait Pattern/deviations: Step-through pattern, Decreased step length - right, Decreased step length - left       General Gait Details: only able to ambulate in room today due to progressive R knee and foot pain limiting mobility. x1 episode of RLE buckling needing minA to prevent fall.   Stairs             Wheelchair Mobility     Tilt Bed Tilt Bed Patient Response: Cooperative  Modified Rankin (Stroke Patients Only)       Balance Overall balance assessment: Needs assistance Sitting-balance support: Single extremity supported Sitting balance-Leahy Scale: Fair     Standing balance support: Bilateral upper extremity supported, During functional activity Standing balance-Leahy Scale: Poor Standing balance comment: RW for support                            Communication Communication Communication: Impaired Factors Affecting Communication: Hearing impaired  Cognition Arousal: Alert Behavior During Therapy: WFL for tasks assessed/performed                           PT - Cognition Comments: cues for  safety and awarenss Following commands: Impaired Following commands impaired: Follows one step commands with increased time, Follows one step commands inconsistently    Cueing Cueing Techniques: Verbal cues, Visual cues  Exercises      General Comments        Pertinent Vitals/Pain Pain Assessment Pain Assessment: Faces Faces Pain Scale: Hurts even more Pain Location: R foot/knee Pain Descriptors / Indicators: Sore, Discomfort,  Grimacing Pain Intervention(s): Limited activity within patient's tolerance, Monitored during session, Repositioned    Home Living                          Prior Function            PT Goals (current goals can now be found in the care plan section) Acute Rehab PT Goals Patient Stated Goal: Improved balance PT Goal Formulation: With patient Time For Goal Achievement: 06/07/24 Potential to Achieve Goals: Good Progress towards PT goals: Not progressing toward goals - comment (limited by chronic RLE pain)    Frequency    Min 2X/week      PT Plan      Co-evaluation              AM-PAC PT 6 Clicks Mobility   Outcome Measure  Help needed turning from your back to your side while in a flat bed without using bedrails?: A Lot Help needed moving from lying on your back to sitting on the side of a flat bed without using bedrails?: A Lot Help needed moving to and from a bed to a chair (including a wheelchair)?: A Little Help needed standing up from a chair using your arms (e.g., wheelchair or bedside chair)?: A Little Help needed to walk in hospital room?: A Lot Help needed climbing 3-5 steps with a railing? : A Lot 6 Click Score: 14    End of Session Equipment Utilized During Treatment: Gait belt Activity Tolerance: Patient limited by pain Patient left: in chair;with call bell/phone within reach;with chair alarm set;with family/visitor present Nurse Communication: Mobility status PT Visit Diagnosis: History of falling (Z91.81);Muscle weakness (generalized) (M62.81);Difficulty in walking, not elsewhere classified (R26.2)     Time: 9040-8984 PT Time Calculation (min) (ACUTE ONLY): 16 min  Charges:    $Therapeutic Activity: 8-22 mins PT General Charges $$ ACUTE PT VISIT: 1 Visit                    Dorina HERO. Fairly IV, PT, DPT Physical Therapist- [facility] 05/29/2024, 11:56 AM

## 2024-05-29 NOTE — Progress Notes (Signed)
  Subjective: 5 Days Post-Op Procedure(s) (LRB): RIGHT FIFTH FINGER TENDON FLEXOR IRRIGATION AND DEBRIDEMENT (Right) Patient reports pain as mild.  Blood on his bandage this morning. Patient is well, and has had no acute complaints or problems Plan is to go Home after hospital stay. Negative for chest pain and shortness of breath Fever: no Gastrointestinal:Negative for nausea and vomiting this morning. Patient reports he is passing gas this morning.  Objective: Vital signs in last 24 hours: Temp:  [97.9 F (36.6 C)-98.4 F (36.9 C)] 98.2 F (36.8 C) (07/10 0444) Pulse Rate:  [70-79] 77 (07/10 0444) Resp:  [16-20] 18 (07/10 0444) BP: (100-149)/(58-77) 120/62 (07/10 0444) SpO2:  [94 %-98 %] 97 % (07/10 0444)  Intake/Output from previous day:  Intake/Output Summary (Last 24 hours) at 05/29/2024 0748 Last data filed at 05/29/2024 0714 Gross per 24 hour  Intake 560 ml  Output 250 ml  Net 310 ml    Intake/Output this shift: Total I/O In: 100 [IV Piggyback:100] Out: -   Labs: Recent Labs    05/27/24 0505 05/28/24 0352 05/29/24 0325  HGB 12.9* 12.3* 12.0*   Recent Labs    05/28/24 0352 05/29/24 0325  WBC 14.2* 14.7*  RBC 3.60* 3.56*  HCT 35.6* 34.8*  PLT 258 273   Recent Labs    05/28/24 0352 05/29/24 0325  NA 129* 130*  K 3.5 3.7  CL 97* 100  CO2 24 22  BUN 17 18  CREATININE 0.80 0.75  GLUCOSE 105* 106*  CALCIUM 8.4* 8.5*   No results for input(s): LABPT, INR in the last 72 hours.   EXAM General - Patient is Alert, Appropriate, and Oriented Extremity - Dressing intact this morning with no drainage or discharge and no bleeding. Tenderness improved over the volar aspect of the finger. Intact sensation to light touch. New dressing applied.    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   Leukocytosis    Prostate cancer (HCC)    Shingles     Assessment/Plan: 5 Days Post-Op Procedure(s) (LRB): RIGHT FIFTH FINGER TENDON FLEXOR IRRIGATION AND DEBRIDEMENT (Right) Principal Problem:   Tenosynovitis of finger and hand Active Problems:   Uncontrolled hypertension   Drug-induced autoimmune hemolytic anemia (HCC)   Cancer of prostate w/low recurrence risk (T1-2a, Gleason<7 & PSA<10) (HCC)   Eye globe prosthesis   History of ITP   Pure hypercholesterolemia   Tenosynovitis of finger  Estimated body mass index is 26.16 kg/m as calculated from the following:   Height as of this encounter: 5' 7 (1.702 m).   Weight as of this encounter: 75.8 kg. Advance diet Up with therapy D/C IV fluids when tolerating po intake.  Labs and vitals reviewed.  WBC 14.7.  Hg 12.0. Swelling and erythema both improved.  New dressing applied. Cultures from surgery are pending at this time. Continue IV Vancomycin  and Maxipime . PT/OT eval.  DVT Prophylaxis - TED hose Activities as tolerated with the right hand.  Dressing change as needed.  Krystal Doyne [facility] 05/29/2024, 7:48 AM

## 2024-05-29 NOTE — Plan of Care (Signed)

## 2024-05-29 NOTE — Progress Notes (Signed)
 PROGRESS NOTE    [name].   FMW:[id number] DOB: 1932-06-14  DOA: 05/24/2024 Date of Service: 05/29/24 which is hospital day 4  PCP: [name], MD    Hospital course / significant events:   HPI: Keith Alexa. is a 89 y.o. right-handed male with a past medical history significant for ITP, HTN, HLD, and prostate cancer who presents with concern for right hand infection.   07/05: admitted to hospitalist service, underwent Right 5th finger flexor tendon irrigation and debridement  07/07-07/08: abx, await cultures, await SNF placement  07/09: leukocytosis and hyponatremia, monitoring, still awaiting SNF  07/10: persistent leukocytosis and hyponatremia both stable from yesterday. R foot pain, XR pending but benign exam. CXR and UA pending as well as peripheral smear addeed to w/u leukocytosis, Urine osm/Na and Serum osm pending to w/u hyponatremia     Consultants:  Orthopedics   Procedures/Surgeries: Right 5th finger flexor tendon irrigation and debridement       ASSESSMENT & PLAN:   Tenosynovitis of right fifth finger Patient underwent irrigation of the 5th finger flexor tendon and debridement by orthopedic surgeon on 05/24/2024  Continue antibiotics  According to orthopedics patient can potentially be discharged on Augmentin   R foot pain XR pending   Leukocytosis - worsening 05/28/24 and stable into 05/29/24  No apparent worsening or new infection Monitor CBC Continue on abx, consider add vanc for MRSA coverage but hand appears improving and cultures negative, hold off for now further w/u - CXR and UA, periph smear, no sepsis/SIRS will hold off on BCx since already been on abx w/ cefepime   Hyponatremia  Appears euvolemic  Liberalize salt intake - dc cardiac diet  Serum Osm, Urine Osm, Urine Na pending   Constipation Bowel regimen  Uncontrolled hypertension - improved  Lisinopril  20 mg daily and propranolol  40 mg daily. Amlodipine  added  given uncontrolled hypertension   Pure hypercholesterolemia Continue pravachol  20 mg at bedtime.    Eye globe prosthesis Left eye.   History of ITP Platelet currently stable.  Outpatient follow-up    overweight based on BMI: Body mass index is 26.16 kg/m.[name] Significantly low or high BMI is associated with higher medical risk.  Underweight - under 18  overweight - 25 to 29 obese - 30 or more Class 1 obesity: BMI of 30.0 to 34 Class 2 obesity: BMI of 35.0 to 39 Class 3 obesity: BMI of 40.0 to 49 Super Morbid Obesity: BMI 50-59 Super-super Morbid Obesity: BMI 60+ Healthy nutrition and physical activity advised as adjunct to other disease management and risk reduction treatments    DVT prophylaxis: lovenox  IV fluids: no continuous IV fluids  Nutrition: regular diet Central lines / other devices: none  Code Status: FULL CODE ACP documentation reviewed:  on file in [facility]  TOC needs: SNF placement Medical barriers to dispo: hyponatremia and leukocytosis. Expected medical readiness for discharge pending lab improvement.              Subjective / Brief ROS:  Patient reports R foot pain Denies cough, denies sore throat or sinus pressure Denies dysuria Denies CP/SOB.  Pain controlled.  Denies new weakness.  Tolerating diet.  Reports constipation  Family Communication: wife is at bedside on rounds    Objective Findings:  Vitals:   05/28/24 1608 05/28/24 2029 05/29/24 0444 05/29/24 0832  BP: (!) 142/77 (!) 100/58 120/62 (!) 156/88  Pulse: 70 79 77 88  Resp: 16 20 18 16   Temp: 97.9 F (36.6 C)  98.1 F (36.7 C) 98.2 F (36.8 C)   TempSrc:      SpO2: 98% 96% 97% 96%  Weight:      Height:        Intake/Output Summary (Last 24 hours) at 05/29/2024 1402 Last data filed at 05/29/2024 1024 Gross per 24 hour  Intake 440 ml  Output 550 ml  Net -110 ml   Filed Weights   05/24/24 1115  Weight: 75.8 kg    Examination:  Physical Exam Cardiovascular:      Rate and Rhythm: Normal rate and regular rhythm.  Pulmonary:     Effort: Pulmonary effort is normal.     Breath sounds: Normal breath sounds.  Abdominal:     General: Bowel sounds are normal.     Palpations: Abdomen is soft.  Skin:    General: Skin is warm and dry.     Comments: Bandage in place on R hand was not disturbed, NV itnact distal to bandage   Neurological:     Mental Status: He is alert. Mental status is at baseline.  Psychiatric:        Mood and Affect: Mood normal.        Behavior: Behavior normal.          Scheduled Medications:   amLODipine   5 mg Oral Daily   citalopram   20 mg Oral Daily   dorzolamide   1 drop Right Eye BID   enoxaparin  (LOVENOX ) injection  40 mg Subcutaneous Q24H   latanoprost   1 drop Right Eye QHS   lisinopril   20 mg Oral Daily   pantoprazole   40 mg Oral Daily   polyethylene glycol  17 g Oral BID   pravastatin   20 mg Oral QHS   propranolol   40 mg Oral BID   senna-docusate  2 tablet Oral Once    Continuous Infusions:  ceFEPime  (MAXIPIME ) IV 2 g (05/29/24 1034)    PRN Medications:  acetaminophen  **OR** acetaminophen , alum & mag hydroxide-simeth, bisacodyl , morphine  injection, Muscle Rub, sodium phosphate   Antimicrobials from admission:  Anti-infectives (From admission, onward)    Start     Dose/Rate Route Frequency Ordered Stop   05/25/24 1300  vancomycin  (VANCOREADY) IVPB 1250 mg/250 mL  Status:  Discontinued        1,250 mg 166.7 mL/hr over 90 Minutes Intravenous Every 24 hours 05/24/24 2122 05/27/24 1355   05/24/24 2215  ceFEPIme  (MAXIPIME ) 2 g in sodium chloride  0.9 % 100 mL IVPB        2 g 200 mL/hr over 30 Minutes Intravenous Every 12 hours 05/24/24 2122     05/24/24 2200  vancomycin  (VANCOCIN ) IVPB 1000 mg/200 mL premix  Status:  Discontinued        1,000 mg 200 mL/hr over 60 Minutes Intravenous  Once 05/24/24 2103 05/24/24 2105   05/24/24 2200  ceFEPIme  (MAXIPIME ) 2 g in sodium chloride  0.9 % 100 mL IVPB  Status:   Discontinued        2 g 200 mL/hr over 30 Minutes Intravenous  Once 05/24/24 2103 05/24/24 2122   05/24/24 1615  ceFAZolin  (ANCEF ) IVPB 2g/100 mL premix  Status:  Discontinued        2 g 200 mL/hr over 30 Minutes Intravenous  Once 05/24/24 1601 05/24/24 1858   05/24/24 1230  vancomycin  (VANCOREADY) IVPB 1500 mg/300 mL  Status:  Discontinued        1,500 mg 150 mL/hr over 120 Minutes Intravenous  Once 05/24/24 1203 05/24/24 1217   05/24/24 1230  vancomycin  (VANCOREADY) IVPB 1750 mg/350 mL        1,750 mg 175 mL/hr over 120 Minutes Intravenous  Once 05/24/24 1218 05/24/24 1440   05/24/24 1200  Ampicillin -Sulbactam (UNASYN ) 3 g in sodium chloride  0.9 % 100 mL IVPB        3 g 200 mL/hr over 30 Minutes Intravenous  Once 05/24/24 1153 05/24/24 1220           Data Reviewed:  I have personally reviewed the following...  CBC: Recent Labs  Lab 05/24/24 1156 05/25/24 0513 05/26/24 0344 05/27/24 0505 05/28/24 0352 05/29/24 0325  WBC 10.9* 9.8 13.3* 13.7* 14.2* 14.7*  NEUTROABS 8.5*  --  9.9* 10.8* 11.7* 11.8*  HGB 12.3* 13.1 12.0* 12.9* 12.3* 12.0*  HCT 36.0* 38.4* 35.0* 37.9* 35.6* 34.8*  MCV 100.0 98.0 98.9 100.8* 98.9 97.8  PLT 276 270 273 276 258 273   Basic Metabolic Panel: Recent Labs  Lab 05/25/24 0513 05/26/24 0344 05/27/24 0505 05/28/24 0352 05/29/24 0325  NA 133* 134* 132* 129* 130*  K 3.8 3.4* 3.8 3.5 3.7  CL 102 103 98 97* 100  CO2 23 23 25 24 22   GLUCOSE 151* 101* 105* 105* 106*  BUN 13 20 18 17 18   CREATININE 0.81 0.90 0.70 0.80 0.75  CALCIUM 8.8* 8.6* 8.7* 8.4* 8.5*   GFR: Estimated Creatinine Clearance: 58.5 mL/min (by C-G formula based on SCr of 0.75 mg/dL). Liver Function Tests: Recent Labs  Lab 05/25/24 0513  AST 21  ALT 21  ALKPHOS 91  BILITOT 0.7  PROT 7.0  ALBUMIN 3.4*   No results for input(s): LIPASE, AMYLASE in the last 168 hours. No results for input(s): AMMONIA in the last 168 hours. Coagulation Profile: No results for  input(s): INR, PROTIME in the last 168 hours. Cardiac Enzymes: No results for input(s): CKTOTAL, CKMB, CKMBINDEX, TROPONINI in the last 168 hours. BNP (last 3 results) No results for input(s): PROBNP in the last 8760 hours. HbA1C: No results for input(s): HGBA1C in the last 72 hours. CBG: Recent Labs  Lab 05/26/24 2138  GLUCAP 120*   Lipid Profile: No results for input(s): CHOL, HDL, LDLCALC, TRIG, CHOLHDL, LDLDIRECT in the last 72 hours. Thyroid  Function Tests: No results for input(s): TSH, T4TOTAL, FREET4, T3FREE, THYROIDAB in the last 72 hours. Anemia Panel: No results for input(s): VITAMINB12, FOLATE, FERRITIN, TIBC, IRON, RETICCTPCT in the last 72 hours. Most Recent Urinalysis On File:     Component Value Date/Time   COLORURINE STRAW (A) 08/24/2015 1045   APPEARANCEUR CLEAR (A) 08/24/2015 1045   APPEARANCEUR Clear 12/08/2014 1450   LABSPEC 1.004 (L) 08/24/2015 1045   LABSPEC 1.043 12/08/2014 1450   PHURINE 8.0 08/24/2015 1045   GLUCOSEU NEGATIVE 08/24/2015 1045   GLUCOSEU Negative 12/08/2014 1450   HGBUR NEGATIVE 08/24/2015 1045   BILIRUBINUR NEGATIVE 08/24/2015 1045   BILIRUBINUR Negative 12/08/2014 1450   KETONESUR NEGATIVE 08/24/2015 1045   PROTEINUR NEGATIVE 08/24/2015 1045   NITRITE NEGATIVE 08/24/2015 1045   LEUKOCYTESUR NEGATIVE 08/24/2015 1045   LEUKOCYTESUR Negative 12/08/2014 1450   Sepsis Labs: @LABRCNTIP (procalcitonin:4,lacticidven:4) Microbiology: Recent Results (from the past 240 hours)  Aerobic/Anaerobic Culture w Gram Stain (surgical/deep wound)     Status: None   Collection Time: 05/24/24  5:07 PM   Specimen: Wound; Abscess  Result Value Ref Range Status   Specimen Description   Final    WOUND Performed at [facility], [address] [place], [place] [address]    Special Requests   Final  NONE Performed at [facility], [address] [place], [place] [address]     Gram Stain   Final    RARE WBC PRESENT, PREDOMINANTLY PMN NO ORGANISMS SEEN    Culture   Final    No growth aerobically or anaerobically. Performed at [facility], [address], [place], [place] [address]    Report Status 05/29/2024 FINAL  Final  Aerobic/Anaerobic Culture w Gram Stain (surgical/deep wound)     Status: None   Collection Time: 05/24/24  5:12 PM   Specimen: Wound; Abscess  Result Value Ref Range Status   Specimen Description   Final    WOUND Performed at [facility], [address] [place], [place] [address]    Special Requests   Final    NONE Performed at [facility], [address] [place], [place] [address]    Gram Stain   Final    RARE WBC PRESENT, PREDOMINANTLY PMN NO ORGANISMS SEEN    Culture   Final    No growth aerobically or anaerobically. Performed at [facility], [address] [place], [place] [address]    Report Status 05/29/2024 FINAL  Final  Culture, blood (Routine X 2) w Reflex to ID Panel     Status: None (Preliminary result)   Collection Time: 05/24/24 10:01 PM   Specimen: BLOOD  Result Value Ref Range Status   Specimen Description BLOOD BLOOD RIGHT ARM  Final   Special Requests   Final    BOTTLES DRAWN AEROBIC AND ANAEROBIC Blood Culture results may not be optimal due to an inadequate volume of blood received in culture bottles   Culture   Final    NO GROWTH 4 DAYS Performed at [facility], [address] [place], [place] [address]    Report Status PENDING  Incomplete  Culture, blood (Routine X 2) w Reflex to ID Panel     Status: None (Preliminary result)   Collection Time: 05/24/24 10:01 PM   Specimen: BLOOD  Result Value Ref Range Status   Specimen Description BLOOD BLOOD RIGHT HAND  Final   Special Requests   Final    BOTTLES DRAWN AEROBIC AND ANAEROBIC Blood Culture results may not be optimal due to an inadequate volume of blood received in culture bottles   Culture   Final     NO GROWTH 4 DAYS Performed at [facility], [address] [place], [place] [address]    Report Status PENDING  Incomplete      Radiology Studies last 3 days: No results found.          Keith Hoaglin, DO [facility] 05/29/2024, 2:02 PM    Dictation software may have been used to generate the above note. Typos may occur and escape review in typed/dictated notes. Please contact Dr Marsa directly for clarity if needed.  Staff may message me via secure chat in Epic  but this may not receive an immediate response,  please page me for urgent matters!  If 7PM-7AM, please contact night coverage [email or web address]

## 2024-05-29 NOTE — Progress Notes (Signed)
 Occupational Therapy Treatment Patient Details Name: [name]. MRN: [id number] DOB: 11/12/1932 Today's Date: 05/29/2024   History of present illness Pt is an 89 y.o. right-handed male with a past medical history significant for ITP, HTN, HLD, and prostate cancer who presents with concern for right hand infection.  MD assessment includes right 5th finger flexor tenosynovitis and pt is now s/p R 5th finger flexor tendon irrigation and debridement.   OT comments  Pt seen for OT treatment on this date. Upon arrival to room pt seated in recliner chair finishing lunch, agreeable to tx. Pt requires Min A for sit to stand transfers at [address] [facility] level with skilled instruction and cuing for hand and foot placement.  In standing, therapist directed patient through static standing balance task and progressed to light weight shifting as patient required cuing to ensure appropriate BOS in standing to reduce risk of LOB or falls with CGA required overall.  Patient reported discomfort/pain in RLE specifically lateral aspect of foot and therapist returned patient to sitting in recliner.  Patient's spouse reported that patient is expected to have a chest x-ray and x-ray of R foot today.  During session, x-ray techs arrived for service and therapist transferred care and ended session.  Pt making progress toward goals, will continue to follow POC. Discharge recommendation remains appropriate.        If plan is discharge home, recommend the following:      Equipment Recommendations       Recommendations for Other Services      Precautions / Restrictions Precautions Precautions: Fall Recall of Precautions/Restrictions: Impaired Restrictions Other Position/Activity Restrictions: WBAT to R hand per Dr. Tobie       Mobility Bed Mobility                    Transfers Overall transfer level: Needs assistance Equipment used: Rolling walker (2 wheels) Transfers: Sit to/from Stand Sit to Stand: Min  assist           General transfer comment: max cuing for safety and proficiency with technique     Balance                                           ADL either performed or assessed with clinical judgement   ADL                           Toilet Transfer: Minimal assistance Toilet Transfer Details (indicate cue type and reason): Min A for sit to stand transfer to RW to simulate initiating toilet transfers                Extremity/Trunk Assessment Upper Extremity Assessment Upper Extremity Assessment: Generalized weakness RUE Deficits / Details: 5th finger bandaged, able to WB through [employer]       Perception     Praxis     Communication Communication Communication: Impaired Factors Affecting Communication: Hearing impaired   Cognition Arousal: Alert Behavior During Therapy: [facility] for tasks assessed/performed                                          Cueing  Exercises Other Exercises Other Exercises: education on technique for functional transfers    Shoulder Instructions       General Comments      Pertinent Vitals/ Pain       Pain Assessment Pain Assessment: Faces Faces Pain Scale: Hurts little more Pain Location: R foot, along lateral aspect of R foot  Home Living                                          Prior Functioning/Environment              Frequency  Min 2X/week        Progress Toward Goals  OT Goals(current goals can now be found in the care plan section)  Progress towards OT goals: Progressing toward goals     Plan      Co-evaluation                 AM-PAC OT 6 Clicks Daily Activity     Outcome Measure                    End of Session Equipment Utilized During Treatment: Gait belt;Rolling walker (2 wheels)  OT Visit Diagnosis: Other abnormalities of gait and mobility (R26.89);Repeated falls (R29.6);Muscle  weakness (generalized) (M62.81)   Activity Tolerance Patient tolerated treatment well   Patient Left in chair;with call bell/phone within reach;with family/visitor present (2 x-ray techs present and attending to patient)   Nurse Communication          Time: 8588-8576 OT Time Calculation (min): 12 min  Charges: OT General Charges $OT Visit: 1 Visit  [name] OTR/L   [name] 05/29/2024, 2:37 PM

## 2024-05-29 NOTE — Discharge Instructions (Signed)
 INSTRUCTIONS AFTER Surgery  Remove items at home which could result in a fall. This includes throw rugs or furniture in walking pathways ICE to the affected joint every three hours while awake for 30 minutes at a time, for at least the first 3-5 days, and then as needed for pain and swelling.  Continue to use ice for pain and swelling. You may notice swelling that will progress down to the foot and ankle.  This is normal after surgery.  Elevate your leg when you are not up walking on it.   Continue to use the breathing machine you got in the hospital (incentive spirometer) which will help keep your temperature down.  It is common for your temperature to cycle up and down following surgery, especially at night when you are not up moving around and exerting yourself.  The breathing machine keeps your lungs expanded and your temperature down.   DIET:  As you were doing prior to hospitalization, we recommend a well-balanced diet.  DRESSING / WOUND CARE / SHOWERING  Dressing change as needed.  No showering.  Sutures will be removed in about 10 days at the [facility] orth[facility]  ACTIVITY  Increase activity slowly as tolerated, but follow the weight bearing instructions below.   No driving for 6 weeks or until further direction given by your physician.  You cannot drive while taking narcotics.  No lifting or carrying greater than 10 lbs. until further directed by your surgeon. Avoid periods of inactivity such as sitting longer than an hour when not asleep. This helps prevent blood clots.  You may return to work once you are authorized by your doctor.     WEIGHT BEARING  Weightbearing as tolerated on the right arm   EXERCISES No exercises besides gentle range of motion.  CONSTIPATION  Constipation is defined medically as fewer than three stools per week and severe constipation as less than one stool per week.  Even if you have a regular bowel pattern at home, your normal regimen is  likely to be disrupted due to multiple reasons following surgery.  Combination of anesthesia, postoperative narcotics, change in appetite and fluid intake all can affect your bowels.   YOU MUST use at least one of the following options; they are listed in order of increasing strength to get the job done.  They are all available over the counter, and you may need to use some, POSSIBLY even all of these options:    Drink plenty of fluids (prune juice may be helpful) and high fiber foods Colace 100 mg by mouth twice a day  Senokot for constipation as directed and as needed Dulcolax (bisacodyl ), take with full glass of water  Miralax  (polyethylene glycol) once or twice a day as needed.  If you have tried all these things and are unable to have a bowel movement in the first 3-4 days after surgery call either your surgeon or your primary doctor.    If you experience loose stools or diarrhea, hold the medications until you stool forms back up.  If your symptoms do not get better within 1 week or if they get worse, check with your doctor.  If you experience the worst abdominal pain ever or develop nausea or vomiting, please contact the office immediately for further recommendations for treatment.   ITCHING:  If you experience itching with your medications, try taking only a single pain pill, or even half a pain pill at a time.  You can also use Benadryl   over the counter for itching or also to help with sleep.   TED HOSE STOCKINGS:  Use stockings on both legs until for at least 2 weeks or as directed by physician office. They may be removed at night for sleeping.  MEDICATIONS:  See your medication summary on the "After Visit Summary" that nursing will review with you.  You may have some home medications which will be placed on hold until you complete the course of blood thinner medication.  It is important for you to complete the blood thinner medication as prescribed.  PRECAUTIONS:  If you experience  chest pain or shortness of breath - call 911 immediately for transfer to the hospital emergency department.   If you develop a fever greater that 101 F, purulent drainage from wound, increased redness or drainage from wound, foul odor from the wound/dressing, or calf pain - CONTACT YOUR SURGEON.                                                   FOLLOW-UP APPOINTMENTS:  If you do not already have a post-op appointment, please call the office for an appointment to be seen by your surgeon.  Guidelines for how soon to be seen are listed in your "After Visit Summary", but are typically between 1-4 weeks after surgery.  OTHER INSTRUCTIONS:     MAKE SURE YOU:  Understand these instructions.  Get help right away if you are not doing well or get worse.    Thank you for letting us  be a part of your medical care team.  It is a privilege we respect greatly.  We hope these instructions will help you stay on track for a fast and full recovery!

## 2024-05-29 NOTE — TOC Progression Note (Signed)
 Transition of Care (TOC) - Progression Note    Patient Details  Name: [name]. MRN: [id number] Date of Birth: Apr 02, 1932  Transition of Care [facility]) CM/SW Contact  [place] L Alfonzia [place], [place] Phone Number: 05/29/2024, 10:51 AM  Clinical Narrative:      CSW received a call from [facility] [employer] advising that a peer to peer review was needed for approval as they do not feel at this time patients needs meet the criteria for SNF.   CSW provided information to attending.       Expected Discharge Plan and Services                                               Social Determinants of Health (SDOH) Interventions SDOH Screenings   Food Insecurity: No Food Insecurity (05/24/2024)  Housing: Low Risk  (05/24/2024)  Transportation Needs: No Transportation Needs (05/24/2024)  Utilities: Not At Risk (05/24/2024)  Financial Resource Strain: Patient Declined (11/06/2023)   Received from [facility]  Social Connections: Moderately Integrated (05/24/2024)  Tobacco Use: Low Risk  (05/24/2024)    Readmission Risk Interventions     No data to display

## 2024-05-30 DIAGNOSIS — M65949 Unspecified synovitis and tenosynovitis, unspecified hand: Secondary | ICD-10-CM | POA: Diagnosis not present

## 2024-05-30 LAB — BASIC METABOLIC PANEL WITH GFR
Anion gap: 10 (ref 5–15)
BUN: 20 mg/dL (ref 8–23)
CO2: 23 mmol/L (ref 22–32)
Calcium: 8.4 mg/dL — ABNORMAL LOW (ref 8.9–10.3)
Chloride: 97 mmol/L — ABNORMAL LOW (ref 98–111)
Creatinine, Ser: 0.66 mg/dL (ref 0.61–1.24)
GFR, Estimated: >60 mL/min (ref >60)
Glucose, Bld: 97 mg/dL (ref 70–99)
Potassium: 3.8 mmol/L (ref 3.5–5.1)
Sodium: 130 mmol/L — ABNORMAL LOW (ref 135–145)

## 2024-05-30 LAB — CBC
HCT: 33.8 % — ABNORMAL LOW (ref 39.0–52.0)
Hemoglobin: 11.8 g/dL — ABNORMAL LOW (ref 13.0–17.0)
MCH: 33.6 pg (ref 26.0–34.0)
MCHC: 34.9 g/dL (ref 30.0–36.0)
MCV: 96.3 fL (ref 80.0–100.0)
Platelets: 314 K/uL (ref 150–400)
RBC: 3.51 MIL/uL — ABNORMAL LOW (ref 4.22–5.81)
RDW: 13.5 % (ref 11.5–15.5)
WBC: 11.9 K/uL — ABNORMAL HIGH (ref 4.0–10.5)
nRBC: 0 % (ref 0.0–0.2)

## 2024-05-30 LAB — CULTURE, BLOOD (ROUTINE X 2)
Culture: NO GROWTH
Culture: NO GROWTH

## 2024-05-30 MED ORDER — AMOXICILLIN-POT CLAVULANATE 875-125 MG PO TABS
1.0000 | ORAL_TABLET | Freq: Two times a day (BID) | ORAL | Status: DC
  Administered 2024-05-30 – 2024-05-31 (×2): 1 via ORAL
  Filled 2024-05-30 (×2): qty 1

## 2024-05-30 NOTE — Progress Notes (Signed)
 PROGRESS NOTE    [name].   FMW:[id number] DOB: 15-May-1932  DOA: 05/24/2024 Date of Service: 05/30/24 which is hospital day 5  PCP: [name], MD    Hospital course / significant events:   HPI: [name]. is a 89 y.o. right-handed male with a past medical history significant for ITP, HTN, HLD, and prostate cancer who presents with concern for right hand infection.   07/05: admitted to hospitalist service, underwent Right 5th finger flexor tendon irrigation and debridement  07/07-07/08: abx, await cultures, await SNF placement  07/09: leukocytosis and hyponatremia, monitoring, still awaiting SNF  07/10: persistent leukocytosis and hyponatremia both stable from yesterday. R foot pain, XR pending but benign exam. CXR and UA pending as well as peripheral smear addeed to w/u leukocytosis, Urine osm/Na and Serum osm pending to w/u hyponatremia 07/11: Workup negative for hyponatremia, sodium level stable, WBC improving, patient reports feeling pretty good today.  Here to peer completed, insurance still denying SNF, await PT eval today and based on that wife will make decision regarding appeal the denial, we will work on setting up home health/DME     Consultants:  Orthopedics   Procedures/Surgeries: Right 5th finger flexor tendon irrigation and debridement       ASSESSMENT & PLAN:   Tenosynovitis of right fifth finger Patient underwent irrigation of the 5th finger flexor tendon and debridement by orthopedic surgeon on 05/24/2024  Transition to p.o. antibiotics  R foot pain Benign exam and x-rays, pain control and continue PT  Leukocytosis - worsening 05/28/24 and stable into 05/29/24 improving into 05/30/2024 No apparent worsening or new infection Monitor CBC  Hyponatremia  Appears euvolemic  Liberalize salt intake - dc cardiac diet  Serum Osm, Urine Osm, Urine Na unrevealing, repeat as necessary  Constipation Bowel regimen  Uncontrolled  hypertension - improved  Lisinopril  20 mg daily and propranolol  40 mg daily. Amlodipine  added given uncontrolled hypertension   Pure hypercholesterolemia Continue pravachol  20 mg at bedtime.    Eye globe prosthesis Left eye.   History of ITP Platelet currently stable.  Outpatient follow-up    overweight based on BMI: Body mass index is 26.16 kg/m.[name] Significantly low or high BMI is associated with higher medical risk.  Underweight - under 18  overweight - 25 to 29 obese - 30 or more Class 1 obesity: BMI of 30.0 to 34 Class 2 obesity: BMI of 35.0 to 39 Class 3 obesity: BMI of 40.0 to 49 Super Morbid Obesity: BMI 50-59 Super-super Morbid Obesity: BMI 60+ Healthy nutrition and physical activity advised as adjunct to other disease management and risk reduction treatments    DVT prophylaxis: lovenox  IV fluids: no continuous IV fluids  Nutrition: regular diet Central lines / other devices: none  Code Status: FULL CODE ACP documentation reviewed:  on file in [facility]  TOC needs: Home health/DME Medical barriers to dispo: None             Subjective / Brief ROS:  Patient reports feeling pretty good today Denies dysuria Denies CP/SOB.  Pain controlled.  Denies new weakness.  Tolerating diet.   Family Communication: wife is at bedside on rounds    Objective Findings:  Vitals:   05/29/24 2205 05/30/24 0351 05/30/24 0846 05/30/24 1531  BP: (!) 148/89 (!) 162/81 131/79 (!) 147/81  Pulse: 82 76 81 78  Resp:  18 16 16   Temp:  98 F (36.7 C) 98 F (36.7 C) 98 F (36.7 C)  TempSrc:  SpO2:  97% 97% 98%  Weight:      Height:        Intake/Output Summary (Last 24 hours) at 05/30/2024 1825 Last data filed at 05/30/2024 1503 Gross per 24 hour  Intake 200 ml  Output 850 ml  Net -650 ml   Filed Weights   05/24/24 1115  Weight: 75.8 kg    Examination:  Physical Exam Cardiovascular:     Rate and Rhythm: Normal rate and regular rhythm.  Pulmonary:      Effort: Pulmonary effort is normal.     Breath sounds: Normal breath sounds.  Abdominal:     General: Bowel sounds are normal.     Palpations: Abdomen is soft.  Skin:    General: Skin is warm and dry.     Comments: Bandage in place on R hand was not disturbed, NV itnact distal to bandage   Neurological:     Mental Status: He is alert. Mental status is at baseline.  Psychiatric:        Mood and Affect: Mood normal.        Behavior: Behavior normal.          Scheduled Medications:   amLODipine   5 mg Oral Daily   amoxicillin -clavulanate  1 tablet Oral Q12H   citalopram   20 mg Oral Daily   dorzolamide   1 drop Right Eye BID   enoxaparin  (LOVENOX ) injection  40 mg Subcutaneous Q24H   latanoprost   1 drop Right Eye QHS   lisinopril   20 mg Oral Daily   pantoprazole   40 mg Oral Daily   polyethylene glycol  17 g Oral BID   pravastatin   20 mg Oral QHS   propranolol   40 mg Oral BID    Continuous Infusions:    PRN Medications:  acetaminophen  **OR** acetaminophen , alum & mag hydroxide-simeth, bisacodyl , morphine  injection, Muscle Rub, sodium phosphate   Antimicrobials from admission:  Anti-infectives (From admission, onward)    Start     Dose/Rate Route Frequency Ordered Stop   05/30/24 2200  amoxicillin -clavulanate (AUGMENTIN ) 875-125 MG per tablet 1 tablet        1 tablet Oral Every 12 hours 05/30/24 1550     05/25/24 1300  vancomycin  (VANCOREADY) IVPB 1250 mg/250 mL  Status:  Discontinued        1,250 mg 166.7 mL/hr over 90 Minutes Intravenous Every 24 hours 05/24/24 2122 05/27/24 1355   05/24/24 2215  ceFEPIme  (MAXIPIME ) 2 g in sodium chloride  0.9 % 100 mL IVPB  Status:  Discontinued        2 g 200 mL/hr over 30 Minutes Intravenous Every 12 hours 05/24/24 2122 05/30/24 1550   05/24/24 2200  vancomycin  (VANCOCIN ) IVPB 1000 mg/200 mL premix  Status:  Discontinued        1,000 mg 200 mL/hr over 60 Minutes Intravenous  Once 05/24/24 2103 05/24/24 2105   05/24/24 2200   ceFEPIme  (MAXIPIME ) 2 g in sodium chloride  0.9 % 100 mL IVPB  Status:  Discontinued        2 g 200 mL/hr over 30 Minutes Intravenous  Once 05/24/24 2103 05/24/24 2122   05/24/24 1615  ceFAZolin  (ANCEF ) IVPB 2g/100 mL premix  Status:  Discontinued        2 g 200 mL/hr over 30 Minutes Intravenous  Once 05/24/24 1601 05/24/24 1858   05/24/24 1230  vancomycin  (VANCOREADY) IVPB 1500 mg/300 mL  Status:  Discontinued        1,500 mg 150 mL/hr over 120 Minutes Intravenous  Once 05/24/24 1203 05/24/24 1217   05/24/24 1230  vancomycin  (VANCOREADY) IVPB 1750 mg/350 mL        1,750 mg 175 mL/hr over 120 Minutes Intravenous  Once 05/24/24 1218 05/24/24 1440   05/24/24 1200  Ampicillin -Sulbactam (UNASYN ) 3 g in sodium chloride  0.9 % 100 mL IVPB        3 g 200 mL/hr over 30 Minutes Intravenous  Once 05/24/24 1153 05/24/24 1220           Data Reviewed:  I have personally reviewed the following...  CBC: Recent Labs  Lab 05/24/24 1156 05/25/24 0513 05/26/24 0344 05/27/24 0505 05/28/24 0352 05/29/24 0325 05/30/24 0541  WBC 10.9*   < > 13.3* 13.7* 14.2* 14.7* 11.9*  NEUTROABS 8.5*  --  9.9* 10.8* 11.7* 11.8*  --   HGB 12.3*   < > 12.0* 12.9* 12.3* 12.0* 11.8*  HCT 36.0*   < > 35.0* 37.9* 35.6* 34.8* 33.8*  MCV 100.0   < > 98.9 100.8* 98.9 97.8 96.3  PLT 276   < > 273 276 258 273 314   < > = values in this interval not displayed.   Basic Metabolic Panel: Recent Labs  Lab 05/26/24 0344 05/27/24 0505 05/28/24 0352 05/29/24 0325 05/30/24 0541  NA 134* 132* 129* 130* 130*  K 3.4* 3.8 3.5 3.7 3.8  CL 103 98 97* 100 97*  CO2 23 25 24 22 23   GLUCOSE 101* 105* 105* 106* 97  BUN 20 18 17 18 20   CREATININE 0.90 0.70 0.80 0.75 0.66  CALCIUM 8.6* 8.7* 8.4* 8.5* 8.4*   GFR: Estimated Creatinine Clearance: 58.5 mL/min (by C-G formula based on SCr of 0.66 mg/dL). Liver Function Tests: Recent Labs  Lab 05/25/24 0513  AST 21  ALT 21  ALKPHOS 91  BILITOT 0.7  PROT 7.0  ALBUMIN 3.4*    No results for input(s): LIPASE, AMYLASE in the last 168 hours. No results for input(s): AMMONIA in the last 168 hours. Coagulation Profile: No results for input(s): INR, PROTIME in the last 168 hours. Cardiac Enzymes: No results for input(s): CKTOTAL, CKMB, CKMBINDEX, TROPONINI in the last 168 hours. BNP (last 3 results) No results for input(s): PROBNP in the last 8760 hours. HbA1C: No results for input(s): HGBA1C in the last 72 hours. CBG: Recent Labs  Lab 05/26/24 2138  GLUCAP 120*   Lipid Profile: No results for input(s): CHOL, HDL, LDLCALC, TRIG, CHOLHDL, LDLDIRECT in the last 72 hours. Thyroid  Function Tests: No results for input(s): TSH, T4TOTAL, FREET4, T3FREE, THYROIDAB in the last 72 hours. Anemia Panel: No results for input(s): VITAMINB12, FOLATE, FERRITIN, TIBC, IRON, RETICCTPCT in the last 72 hours. Most Recent Urinalysis On File:     Component Value Date/Time   COLORURINE YELLOW (A) 05/29/2024 1955   APPEARANCEUR CLEAR (A) 05/29/2024 1955   APPEARANCEUR Clear 12/08/2014 1450   LABSPEC 1.017 05/29/2024 1955   LABSPEC 1.043 12/08/2014 1450   PHURINE 5.0 05/29/2024 1955   GLUCOSEU NEGATIVE 05/29/2024 1955   GLUCOSEU Negative 12/08/2014 1450   HGBUR NEGATIVE 05/29/2024 1955   BILIRUBINUR NEGATIVE 05/29/2024 1955   BILIRUBINUR Negative 12/08/2014 1450   KETONESUR NEGATIVE 05/29/2024 1955   PROTEINUR 30 (A) 05/29/2024 1955   NITRITE NEGATIVE 05/29/2024 1955   LEUKOCYTESUR NEGATIVE 05/29/2024 1955   LEUKOCYTESUR Negative 12/08/2014 1450   Sepsis Labs: @LABRCNTIP (procalcitonin:4,lacticidven:4) Microbiology: Recent Results (from the past 240 hours)  Aerobic/Anaerobic Culture w Gram Stain (surgical/deep wound)     Status: None   Collection Time: 05/24/24  5:07 PM   Specimen: Wound; Abscess  Result Value Ref Range Status   Specimen Description   Final    WOUND Performed at [facility], [address] [place], [place] [address]    Special Requests   Final    NONE Performed at [facility], [address] [place], [place] [address]    Gram Stain   Final    RARE WBC PRESENT, PREDOMINANTLY PMN NO ORGANISMS SEEN    Culture   Final    No growth aerobically or anaerobically. Performed at [facility], [address] [place], [place] [address]    Report Status 05/29/2024 FINAL  Final  Aerobic/Anaerobic Culture w Gram Stain (surgical/deep wound)     Status: None   Collection Time: 05/24/24  5:12 PM   Specimen: Wound; Abscess  Result Value Ref Range Status   Specimen Description   Final    WOUND Performed at [facility], [address] [place], [place] [address]    Special Requests   Final    NONE Performed at [facility], [address] [place], [place] [address]    Gram Stain   Final    RARE WBC PRESENT, PREDOMINANTLY PMN NO ORGANISMS SEEN    Culture   Final    No growth aerobically or anaerobically. Performed at [facility], [address], [place], [place] [address]    Report Status 05/29/2024 FINAL  Final  Culture, blood (Routine X 2) w Reflex to ID Panel     Status: None   Collection Time: 05/24/24 10:01 PM   Specimen: BLOOD  Result Value Ref Range Status   Specimen Description   Final    BLOOD BLOOD RIGHT ARM Performed at [facility], [address], [place], [place] [address]    Special Requests   Final    BOTTLES DRAWN AEROBIC AND ANAEROBIC Blood Culture results may not be optimal due to an inadequate volume of blood received in culture bottles Performed at [facility], [address] [place], [place] [address]    Culture   Final    NO GROWTH 6 DAYS Performed at [facility], [address], [place], [place] [address]    Report Status 05/30/2024 FINAL  Final  Culture, blood (Routine X 2) w Reflex to ID Panel     Status: None   Collection Time: 05/24/24 10:01 PM    Specimen: BLOOD  Result Value Ref Range Status   Specimen Description   Final    BLOOD BLOOD RIGHT HAND Performed at [facility], [address] [place], [place] [address]    Special Requests   Final    BOTTLES DRAWN AEROBIC AND ANAEROBIC Blood Culture results may not be optimal due to an inadequate volume of blood received in culture bottles Performed at [facility], [address], [place], [place] [address]    Culture   Final    NO GROWTH 6 DAYS Performed at [facility], [address], [place], [place] [address]    Report Status 05/30/2024 FINAL  Final      Radiology Studies last 3 days: DG Chest Port 1 View Result Date: 05/29/2024 CLINICAL DATA:  Leukocytosis.  History prostate cancer. EXAM: PORTABLE CHEST 1 VIEW COMPARISON:  12/08/2014 FINDINGS: Atherosclerotic calcification of the aortic arch. Confluent accentuated linear markings at the left lung base could be from atelectasis or early bronchopneumonia. The lungs appear otherwise clear.  No blunting of the costophrenic angles. Heart size within normal limits. IMPRESSION: 1. Confluent accentuated linear markings at the left lung base could be from atelectasis or early bronchopneumonia. 2. Aortic Atherosclerosis (ICD10-I70.0). Electronically Signed   By: Ryan Salvage M.D.   On: 05/29/2024 16:42   DG Foot Complete Right Result Date: 05/29/2024 CLINICAL DATA:  [id number] Right foot pain [id number] EXAM: RIGHT FOOT COMPLETE - 3+ VIEW COMPARISON:  None Available. FINDINGS: No acute fracture or dislocation. There is no evidence of arthropathy or other focal bone abnormality. Soft tissues are unremarkable. Peripheral vascular atherosclerosis. IMPRESSION: No acute fracture or dislocation. Electronically Signed   By: Rogelia Myers M.D.   On: 05/29/2024 16:40            Laneta Blunt, DO [facility] 05/30/2024, 6:25 PM    Dictation software may have been used to generate the above  note. Typos may occur and escape review in typed/dictated notes. Please contact Dr Blunt directly for clarity if needed.  Staff may message me via secure chat in Epic  but this may not receive an immediate response,  please page me for urgent matters!  If 7PM-7AM, please contact night coverage [email or web address]

## 2024-05-30 NOTE — Plan of Care (Signed)
   Problem: Nutrition: Goal: Adequate nutrition will be maintained Outcome: Progressing   Problem: Coping: Goal: Level of anxiety will decrease Outcome: Progressing   Problem: Elimination: Goal: Will not experience complications related to bowel motility Outcome: Progressing

## 2024-05-30 NOTE — TOC Progression Note (Signed)
 Transition of Care (TOC) - Progression Note    Patient Details  Name: [name]. MRN: [id number] Date of Birth: [birth year]  Transition of Care [facility]) CM/SW Contact  [place] Toryn [place], [place] Phone Number: 05/30/2024, 11:02 AM  Clinical Narrative:     Insurance denied after peer to review. Patient does not have SNF needs. He walked 255ft recently.  Attending advised that they will speak with family about the decision.   Home Health was setup with [facility] and patient can discharge home with PT/OT services.        Expected Discharge Plan and Services                                               Social Determinants of Health (SDOH) Interventions SDOH Screenings   Food Insecurity: No Food Insecurity (05/24/2024)  Housing: Low Risk  (05/24/2024)  Transportation Needs: No Transportation Needs (05/24/2024)  Utilities: Not At Risk (05/24/2024)  Financial Resource Strain: Patient Declined (11/06/2023)   Received from [facility]  Social Connections: Moderately Integrated (05/24/2024)  Tobacco Use: Low Risk  (05/24/2024)    Readmission Risk Interventions     No data to display

## 2024-05-30 NOTE — Progress Notes (Signed)
 Physical Therapy Treatment Patient Details Name: [name]. MRN: [id number] DOB: 1932-09-18 Today's Date: 05/30/2024   History of Present Illness Pt is an 89 y.o. right-handed male with a past medical history significant for ITP, HTN, HLD, and prostate cancer who presents with concern for right hand infection.  MD assessment includes right 5th finger flexor tenosynovitis and pt is now s/p R 5th finger flexor tendon irrigation and debridement.    PT Comments  Pt received upright in recliner with spouse present. Agreeable to PT. Per spouse, pt has been denied SNF placement from insurance. Time spent during session on education on modifications at home to reduce falls risk with STS, car transfers, use of AD and stairs. Pt has trouble from recliner height surfaces needing CGA to minA with mod VC's for hand placement to stand. Pt is able to ambulate ~200' but does rely on chair follow for safety and has x2 LOB needing miNA+1 to correct balance loss. Reviewed stair training today educating spouse on RW mgmt for entering and exiting home. Pt completes safely at supervision level and returned to room. Still rec STR at d/c however did educate on safe practices to reduce falls risk for return home since pt has been denied STR placement. Will continue to progress POC as able to ensure safe transition home.    If plan is discharge home, recommend the following: A little help with walking and/or transfers;A little help with bathing/dressing/bathroom;Assistance with cooking/housework;Direct supervision/assist for medications management;Supervision due to cognitive status;Assist for transportation;Help with stairs or ramp for entrance   Can travel by private vehicle     Yes  Equipment Recommendations  Rolling walker (2 wheels)    Recommendations for Other Services       Precautions / Restrictions Precautions Precautions: Fall Recall of Precautions/Restrictions: Impaired Restrictions Weight  Bearing Restrictions Per Provider Order: Yes RUE Weight Bearing Per Provider Order: Weight bearing as tolerated Other Position/Activity Restrictions: WBAT to R hand per Dr. Tobie     Mobility  Bed Mobility               General bed mobility comments: NT. In recliner pre and post testing Patient Response: Cooperative  Transfers Overall transfer level: Needs assistance Equipment used: Rolling walker (2 wheels) Transfers: Sit to/from Stand Sit to Stand: Min assist, Contact guard assist           General transfer comment: Needs initial minA for STS from recliner and mod VC's for hand placement. On second attempt able to complete CGA but needs bouts of momentum to stand.    Ambulation/Gait Ambulation/Gait assistance: Supervision, Contact guard assist Gait Distance (Feet): 200 Feet Assistive device: Rolling walker (2 wheels) Gait Pattern/deviations: Step-through pattern, Decreased step length - right, Decreased step length - left       General Gait Details: able to complete good distance with chair follow. At end of bout pt does have x2 instances of LOB needing minA to correct due to LE buckling.   Stairs Stairs: Yes Stairs assistance: Supervision Stair Management: One rail Right, Alternating pattern, Forwards, Sideways Number of Stairs: 4 General stair comments: discussed with spouse on RW mgmt and rail use for home completion.   Wheelchair Mobility     Tilt Bed Tilt Bed Patient Response: Cooperative  Modified Rankin (Stroke Patients Only)       Balance Overall balance assessment: Needs assistance Sitting-balance support: Single extremity supported Sitting balance-Leahy Scale: Fair     Standing balance support: Bilateral upper extremity supported,  During functional activity Standing balance-Leahy Scale: Poor Standing balance comment: RW for support                            Communication Communication Communication: Impaired Factors  Affecting Communication: Hearing impaired  Cognition Arousal: Alert Behavior During Therapy: WFL for tasks assessed/performed                           PT - Cognition Comments: cues for safety and awarenss Following commands: Impaired Following commands impaired: Follows one step commands with increased time, Follows one step commands inconsistently    Cueing Cueing Techniques: Verbal cues, Visual cues  Exercises Other Exercises Other Exercises: discussed concerns with STS from lower car surface. Discussed borrowing friend or family member's SUV for higher surface to assist standing. Encouraged use of lift chair at home due to difficulty with STS from lower surfaces.    General Comments        Pertinent Vitals/Pain Pain Assessment Pain Assessment: No/denies pain    Home Living                          Prior Function            PT Goals (current goals can now be found in the care plan section) Acute Rehab PT Goals Patient Stated Goal: Improved balance PT Goal Formulation: With patient Time For Goal Achievement: 06/07/24 Potential to Achieve Goals: Good Progress towards PT goals: Progressing toward goals    Frequency    Min 2X/week      PT Plan      Co-evaluation              AM-PAC PT 6 Clicks Mobility   Outcome Measure  Help needed turning from your back to your side while in a flat bed without using bedrails?: A Lot Help needed moving from lying on your back to sitting on the side of a flat bed without using bedrails?: A Lot Help needed moving to and from a bed to a chair (including a wheelchair)?: A Little Help needed standing up from a chair using your arms (e.g., wheelchair or bedside chair)?: A Little Help needed to walk in hospital room?: A Little Help needed climbing 3-5 steps with a railing? : A Little 6 Click Score: 16    End of Session Equipment Utilized During Treatment: Gait belt Activity Tolerance: Patient  tolerated treatment well Patient left: in chair;with call bell/phone within reach;with chair alarm set;with family/visitor present Nurse Communication: Mobility status PT Visit Diagnosis: History of falling (Z91.81);Muscle weakness (generalized) (M62.81);Difficulty in walking, not elsewhere classified (R26.2)     Time: 8598-8571 PT Time Calculation (min) (ACUTE ONLY): 27 min  Charges:    $Gait Training: 23-37 mins PT General Charges $$ ACUTE PT VISIT: 1 Visit                     Dorina HERO. Fairly IV, PT, DPT Physical Therapist- [facility]  [facility] 05/30/2024, 3:24 PM

## 2024-05-31 ENCOUNTER — Other Ambulatory Visit: Payer: Self-pay

## 2024-05-31 ENCOUNTER — Inpatient Hospital Stay
Admission: EM | Admit: 2024-05-31 | Discharge: 2024-06-09 | DRG: 194 | Disposition: A | Discharge status: Skilled Nursing Facility | Attending: Internal Medicine | Admitting: Internal Medicine

## 2024-05-31 DIAGNOSIS — J984 Other disorders of lung: Secondary | ICD-10-CM | POA: Diagnosis not present

## 2024-05-31 DIAGNOSIS — J9811 Atelectasis: Secondary | ICD-10-CM | POA: Diagnosis present

## 2024-05-31 DIAGNOSIS — J181 Lobar pneumonia, unspecified organism: Secondary | ICD-10-CM | POA: Insufficient documentation

## 2024-05-31 DIAGNOSIS — R509 Fever, unspecified: Secondary | ICD-10-CM | POA: Diagnosis not present

## 2024-05-31 DIAGNOSIS — D591 Autoimmune hemolytic anemia, unspecified: Secondary | ICD-10-CM | POA: Diagnosis present

## 2024-05-31 DIAGNOSIS — D75839 Thrombocytosis, unspecified: Secondary | ICD-10-CM | POA: Diagnosis not present

## 2024-05-31 DIAGNOSIS — Z79899 Other long term (current) drug therapy: Secondary | ICD-10-CM

## 2024-05-31 DIAGNOSIS — J159 Unspecified bacterial pneumonia: Secondary | ICD-10-CM | POA: Diagnosis not present

## 2024-05-31 DIAGNOSIS — Z820 Family history of epilepsy and other diseases of the nervous system: Secondary | ICD-10-CM

## 2024-05-31 DIAGNOSIS — R0989 Other specified symptoms and signs involving the circulatory and respiratory systems: Secondary | ICD-10-CM | POA: Diagnosis not present

## 2024-05-31 DIAGNOSIS — R011 Cardiac murmur, unspecified: Secondary | ICD-10-CM | POA: Diagnosis present

## 2024-05-31 DIAGNOSIS — Z886 Allergy status to analgesic agent status: Secondary | ICD-10-CM

## 2024-05-31 DIAGNOSIS — Z1152 Encounter for screening for COVID-19: Secondary | ICD-10-CM

## 2024-05-31 DIAGNOSIS — I160 Hypertensive urgency: Secondary | ICD-10-CM | POA: Diagnosis present

## 2024-05-31 DIAGNOSIS — I10 Essential (primary) hypertension: Principal | ICD-10-CM | POA: Diagnosis present

## 2024-05-31 DIAGNOSIS — Z881 Allergy status to other antibiotic agents status: Secondary | ICD-10-CM

## 2024-05-31 DIAGNOSIS — I5189 Other ill-defined heart diseases: Secondary | ICD-10-CM | POA: Diagnosis present

## 2024-05-31 DIAGNOSIS — M65941 Unspecified synovitis and tenosynovitis, right hand: Secondary | ICD-10-CM | POA: Diagnosis present

## 2024-05-31 DIAGNOSIS — E871 Hypo-osmolality and hyponatremia: Secondary | ICD-10-CM | POA: Diagnosis present

## 2024-05-31 DIAGNOSIS — Z8546 Personal history of malignant neoplasm of prostate: Secondary | ICD-10-CM

## 2024-05-31 DIAGNOSIS — Z885 Allergy status to narcotic agent status: Secondary | ICD-10-CM

## 2024-05-31 DIAGNOSIS — E7800 Pure hypercholesterolemia, unspecified: Secondary | ICD-10-CM | POA: Diagnosis present

## 2024-05-31 DIAGNOSIS — J189 Pneumonia, unspecified organism: Secondary | ICD-10-CM | POA: Diagnosis present

## 2024-05-31 DIAGNOSIS — Y95 Nosocomial condition: Secondary | ICD-10-CM | POA: Diagnosis present

## 2024-05-31 DIAGNOSIS — Z862 Personal history of diseases of the blood and blood-forming organs and certain disorders involving the immune mechanism: Secondary | ICD-10-CM

## 2024-05-31 DIAGNOSIS — E876 Hypokalemia: Secondary | ICD-10-CM | POA: Diagnosis not present

## 2024-05-31 DIAGNOSIS — Z9889 Other specified postprocedural states: Secondary | ICD-10-CM

## 2024-05-31 DIAGNOSIS — M65949 Unspecified synovitis and tenosynovitis, unspecified hand: Secondary | ICD-10-CM | POA: Diagnosis not present

## 2024-05-31 MED ORDER — AMLODIPINE BESYLATE 5 MG PO TABS
5.0000 mg | ORAL_TABLET | Freq: Once | ORAL | Status: AC
  Administered 2024-05-31: 5 mg via ORAL
  Filled 2024-05-31: qty 1

## 2024-05-31 MED ORDER — QUETIAPINE FUMARATE 25 MG PO TABS
25.0000 mg | ORAL_TABLET | Freq: Every day | ORAL | Status: DC
  Administered 2024-05-31 – 2024-06-08 (×9): 25 mg via ORAL
  Filled 2024-05-31 (×9): qty 1

## 2024-05-31 MED ORDER — AMOXICILLIN-POT CLAVULANATE 875-125 MG PO TABS: 1.0000 | ORAL_TABLET | Freq: Two times a day (BID) | ORAL | 0 refills | Status: DC

## 2024-05-31 MED ORDER — PRAVASTATIN SODIUM 20 MG PO TABS
20.0000 mg | ORAL_TABLET | Freq: Every day | ORAL | Status: DC
  Administered 2024-06-01 – 2024-06-08 (×9): 20 mg via ORAL
  Filled 2024-05-31 (×9): qty 1

## 2024-05-31 MED ORDER — MIRABEGRON ER 25 MG PO TB24
25.0000 mg | ORAL_TABLET | Freq: Every day | ORAL | Status: DC
  Administered 2024-06-01 – 2024-06-09 (×9): 25 mg via ORAL
  Filled 2024-05-31 (×9): qty 1

## 2024-05-31 MED ORDER — LATANOPROST 0.005 % OP SOLN
1.0000 [drp] | Freq: Every day | OPHTHALMIC | Status: DC
  Administered 2024-06-01 – 2024-06-08 (×9): 1 [drp] via OPHTHALMIC
  Filled 2024-05-31: qty 2.5

## 2024-05-31 MED ORDER — VITAMIN D 25 MCG (1000 UNIT) PO TABS
5000.0000 [IU] | ORAL_TABLET | Freq: Every day | ORAL | Status: DC
  Administered 2024-06-01 – 2024-06-09 (×9): 5000 [IU] via ORAL
  Filled 2024-05-31 (×9): qty 5

## 2024-05-31 MED ORDER — PROPRANOLOL HCL 20 MG PO TABS
40.0000 mg | ORAL_TABLET | Freq: Two times a day (BID) | ORAL | Status: DC
  Administered 2024-06-01 – 2024-06-09 (×17): 40 mg via ORAL
  Filled 2024-05-31 (×18): qty 2

## 2024-05-31 MED ORDER — AMOXICILLIN-POT CLAVULANATE 875-125 MG PO TABS
1.0000 | ORAL_TABLET | Freq: Two times a day (BID) | ORAL | Status: DC
  Administered 2024-06-01 – 2024-06-03 (×6): 1 via ORAL
  Filled 2024-05-31 (×6): qty 1

## 2024-05-31 MED ORDER — PANTOPRAZOLE SODIUM 40 MG PO TBEC
40.0000 mg | DELAYED_RELEASE_TABLET | Freq: Every day | ORAL | Status: DC
  Administered 2024-06-01 – 2024-06-09 (×9): 40 mg via ORAL
  Filled 2024-05-31 (×9): qty 1

## 2024-05-31 MED ORDER — AMLODIPINE BESYLATE 5 MG PO TABS
5.0000 mg | ORAL_TABLET | Freq: Every day | ORAL | Status: DC
  Administered 2024-06-01 – 2024-06-09 (×7): 5 mg via ORAL
  Filled 2024-05-31 (×7): qty 1

## 2024-05-31 MED ORDER — CITALOPRAM HYDROBROMIDE 10 MG PO TABS
20.0000 mg | ORAL_TABLET | Freq: Every day | ORAL | Status: DC
  Administered 2024-06-01 – 2024-06-09 (×9): 20 mg via ORAL
  Filled 2024-05-31: qty 1
  Filled 2024-05-31 (×4): qty 2
  Filled 2024-05-31: qty 1
  Filled 2024-05-31: qty 2
  Filled 2024-05-31: qty 1
  Filled 2024-05-31: qty 2

## 2024-05-31 MED ORDER — ACETAMINOPHEN 500 MG PO TABS
500.0000 mg | ORAL_TABLET | Freq: Four times a day (QID) | ORAL | Status: DC | PRN
  Administered 2024-06-01 – 2024-06-06 (×4): 500 mg via ORAL
  Filled 2024-05-31 (×6): qty 1

## 2024-05-31 MED ORDER — DORZOLAMIDE HCL 2 % OP SOLN
1.0000 [drp] | Freq: Two times a day (BID) | OPHTHALMIC | Status: DC
  Administered 2024-06-01 – 2024-06-09 (×17): 1 [drp] via OPHTHALMIC
  Filled 2024-05-31: qty 10

## 2024-05-31 MED ORDER — AMLODIPINE BESYLATE 5 MG PO TABS: 5.0000 mg | ORAL_TABLET | Freq: Every day | ORAL | 0 refills | Status: DC

## 2024-05-31 NOTE — TOC Transition Note (Addendum)
 Transition of Care [facility]) - Discharge Note   Patient Details  Name: [name]. MRN: [id number] Date of Birth: 1932/09/15  [facility]) CM/SW Contact:  Marinda Cooks, RN Phone Number: 05/31/2024, 11:39 AM   Clinical Narrative:    This CM updated by covering MD pt medically cleared to dc today and has active DC order . This CM spoke with  [place] arranged.  DME to be delivered to home per pt's wives request.  DC transportation confirmed for pt provided by family. Medical team updated . No additional DC needs requested by medical team or identified by CM at this time .     Final next level of care: Home w Home Health Services Barriers to Discharge: No Barriers Identified   Name of family member notified: Kashawn Dirr Spouse Emergency Contact [phone] Patient and family notified of of transfer: 05/31/24  Discharge Plan and Services Additional resources added to the After Visit Summary for                  DME Arranged: Walker rolling DME Agency: [employer] Date DME Agency Contacted: 05/25/24 Time DME Agency Contacted: [phone] Representative spoke with at DME Agency: [facility] HH Arranged: PT, OT HH Agency: [facility] Date [facility] Agency Contacted: 05/31/24 Time HH Agency Contacted: [phone]    Social Drivers of Health (SDOH) Interventions SDOH Screenings   Food Insecurity: No Food Insecurity (05/24/2024)  Housing: Low Risk  (05/24/2024)  Transportation Needs: No Transportation Needs (05/24/2024)  Utilities: Not At Risk (05/24/2024)  Financial Resource Strain: Patient Declined (11/06/2023)   Received from [facility]  Social Connections: Moderately Integrated (05/24/2024)  Tobacco Use: Low Risk  (05/24/2024)     Readmission Risk Interventions     No data to display

## 2024-05-31 NOTE — Plan of Care (Signed)
Goals met. Patient discharged

## 2024-05-31 NOTE — Discharge Summary (Addendum)
 Physician Discharge Summary   Patient: [name]. MRN: [id number]  DOB: 10-01-1932   Admit:     Date of Admission: 05/24/2024 Admitted from: home   Discharge: Date of discharge: 05/31/24 Disposition: Home health Condition at discharge: fair  CODE STATUS: FULL CODE     Discharge Physician: Laneta Blunt, DO [facility]     PCP: [name], MD  Recommendations for Outpatient Follow-up:  Follow up with PCP [name], MD in 1-2 weeks Please obtain labs/tests: CBC, BMP in 1 weeks monitor BMP, CBC Please follow up on the following pending results: none Follow as directed w/ orthopedics for suture removal and wound check     Discharge Instructions     Call MD for:  redness, tenderness, or signs of infection (pain, swelling, redness, odor or green/yellow discharge around incision site)   Complete by: As directed    Call MD for:  severe uncontrolled pain   Complete by: As directed    Call MD for:  temperature >100.4   Complete by: As directed    Diet - low sodium heart healthy   Complete by: As directed    Discharge wound care:   Complete by: As directed    Change wound dressing every 1-2 days / as needed if soiled   Increase activity slowly   Complete by: As directed          Discharge Diagnoses: Principal Problem:   Tenosynovitis of finger and hand Active Problems:   Uncontrolled hypertension   Drug-induced autoimmune hemolytic anemia (HCC)   Cancer of prostate w/low recurrence risk (T1-2a, Gleason<7 & PSA<10) (HCC)   Eye globe prosthesis   History of ITP   Pure hypercholesterolemia   Tenosynovitis of finger      Hospital course / significant events:   HPI: [name]. is a 89 y.o. right-handed male with a past medical history significant for ITP, HTN, HLD, and prostate cancer who presents with concern for right hand infection.   07/05: admitted to hospitalist service, underwent Right 5th finger flexor tendon  irrigation and debridement  07/07-07/08: abx, await cultures, await SNF placement  07/09: leukocytosis and hyponatremia, monitoring, still awaiting SNF  07/10: persistent leukocytosis and hyponatremia both stable from yesterday. R foot pain, XR pending but benign exam. CXR and UA pending as well as peripheral smear addeed to w/u leukocytosis, Urine osm/Na and Serum osm pending to w/u hyponatremia 07/11: Workup negative for hyponatremia, sodium level stable, WBC improving, patient reports feeling pretty good today.  Here to peer completed, insurance still denying SNF, await PT eval today and based on that wife will make decision regarding appeal the denial, we will work on setting up home health/DME     Consultants:  Orthopedics   Procedures/Surgeries: Right 5th finger flexor tendon irrigation and debridement       ASSESSMENT & PLAN:   Tenosynovitis of right fifth finger Patient underwent irrigation of the 5th finger flexor tendon and debridement by orthopedic surgeon on 05/24/2024  Transition to p.o. antibiotics  R foot pain Benign exam and x-rays, pain control and continue PT  Leukocytosis - worsening 05/28/24 and stable into 05/29/24 improving into 05/30/2024 No apparent worsening or new infection Monitor CBC  Hyponatremia  Appears euvolemic  Liberalize salt intake - dc cardiac diet  Serum Osm, Urine Osm, Urine Na unrevealing, repeat as necessary  Constipation Bowel regimen  Uncontrolled hypertension - improved  Lisinopril  20 mg daily and propranolol  40 mg daily. Amlodipine   added given uncontrolled hypertension   Pure hypercholesterolemia Continue pravachol  20 mg at bedtime.    Eye globe prosthesis Left eye.   History of ITP Platelet currently stable.  Outpatient follow-up  Debility Gait instability  Ambulatory dysfunction The Beneficiary is confined to a single room and requires bedside commode    overweight based on BMI: Body mass index is 26.16 kg/m.[name]  Significantly low or high BMI is associated with higher medical risk.  Underweight - under 18  overweight - 25 to 29 obese - 30 or more Class 1 obesity: BMI of 30.0 to 34 Class 2 obesity: BMI of 35.0 to 39 Class 3 obesity: BMI of 40.0 to 49 Super Morbid Obesity: BMI 50-59 Super-super Morbid Obesity: BMI 60+ Healthy nutrition and physical activity advised as adjunct to other disease management and risk reduction treatments    DVT prophylaxis: lovenox  IV fluids: no continuous IV fluids  Nutrition: regular diet Central lines / other devices: none  Code Status: FULL CODE ACP documentation reviewed:  on file in [facility]  TOC needs: Home health/DME Medical barriers to dispo: None            Discharge Instructions  Allergies as of 05/31/2024       Reactions   Cephalexin  Diarrhea   C.diff   Hydrocodone-acetaminophen  Nausea Only   Vomiting/dizziness        Medication List     PAUSE taking these medications    eltrombopag  25 MG tablet Wait to take this until your doctor or other care provider tells you to start again. Commonly known as: Promacta  TAKE 1 TABLET (25 MG TOTAL) BY MOUTH EVERY OTHER DAY. TAKE ON AN EMPTY STOMACH, 1 HOUR BEFORE A MEAL OR 2 HOURS AFTER.   mycophenolate  500 MG tablet Wait to take this until your doctor or other care provider tells you to start again. Commonly known as: CELLCEPT  Take 1 tablet (500 mg total) by mouth daily.       TAKE these medications    acetaminophen  500 MG tablet Commonly known as: TYLENOL  Take 500 mg by mouth every 6 (six) hours as needed for mild pain. Reported on 01/24/2016   amLODipine  5 MG tablet Commonly known as: NORVASC  Take 1 tablet (5 mg total) by mouth daily. Start taking on: June 01, 2024   amoxicillin -clavulanate 875-125 MG tablet Commonly known as: AUGMENTIN  Take 1 tablet by mouth every 12 (twelve) hours for 7 doses. Starting this evening 05/31/24 then twice daily starting 06/01/24   citalopram  20  MG tablet Commonly known as: CELEXA  Take 20 mg by mouth daily.   dorzolamide  2 % ophthalmic solution Commonly known as: TRUSOPT  SMARTSIG:In Eye(s)   latanoprost  0.005 % ophthalmic solution Commonly known as: XALATAN  SMARTSIG:In Eye(s)   lisinopril  20 MG tablet Commonly known as: ZESTRIL  Take 20 mg by mouth daily.   mirabegron  ER 25 MG Tb24 tablet Commonly known as: MYRBETRIQ  Take 25 mg by mouth daily.   omeprazole  40 MG capsule Commonly known as: PRILOSEC TAKE 1 CAPSULE BY MOUTH DAILY USUALLY 30 MINUTES BEFORE BREAKFAST   pravastatin  20 MG tablet Commonly known as: PRAVACHOL  Take 20 mg by mouth at bedtime.   propranolol  40 MG tablet Commonly known as: INDERAL  Take 1 tablet by mouth 2 (two) times daily.   Vitamin D3 250 MCG (10000 UT) capsule Take 10,000 Units by mouth daily.               Durable Medical Equipment  (From admission, onward)  Start     Ordered   05/31/24 1150  For home use only DME Bedside commode  Once       Question:  Patient needs a bedside commode to treat with the following condition  Answer:  Gait instability   05/31/24 1149   05/25/24 1554  For home use only DME Other see comment  Once       Comments: Shower Chair  Question:  Length of Need  Answer:  6 Months   05/25/24 1553   05/25/24 1536  For home use only DME Walker rolling  Once       Question Answer Comment  Walker: With 5 Inch Wheels   Patient needs a walker to treat with the following condition Suppurative tenosynovitis of flexor tendon of right hand      05/25/24 1536              Discharge Care Instructions  (From admission, onward)           Start     Ordered   05/31/24 0000  Discharge wound care:       Comments: Change wound dressing every 1-2 days / as needed if soiled   05/31/24 1155             Follow-up Information     Verlinda Boas, PA-C Follow up in 10 day(s).   Specialty: Orthopedic Surgery Why: For suture removal and wound  check Contact information: [address] [place] [address] [phone]         [name], MD. Call.   Specialty: Family Medicine Why: call for hospital follow up appointment Contact information: [address] [facility] [place] [address] [phone]                 Allergies  Allergen Reactions   Cephalexin  Diarrhea    C.diff   Hydrocodone-Acetaminophen  Nausea Only    Vomiting/dizziness     Subjective: pt a bit confused this morning but states feeling well, no complaints. Pain controlled. Wife is at bedside states he was confused / agitated overnight but better this morning    Discharge Exam: BP (!) 158/80 (BP Location: Left Arm)   Pulse 67   Temp 97.7 F (36.5 C)   Resp 16   Ht 5' 7 (1.702 m)   Wt 75.8 kg   SpO2 98%   BMI 26.16 kg/m  General: Pt is alert, awake, not in acute distress Cardiovascular: RRR, S1/S2  Respiratory: CTA bilaterally, no wheezing, no rhonchi Abdominal: Soft, NT, ND, bowel sounds + Extremities: no cyanosis. Dressing removed on R hand and incision/sutures examend are intact and healing appropriately, mild edema likely d/t tighter wrapping of ACE bandage at wrist, re-wrapped looser at wrist today, buddy-wrapped 5th and 4th digits      The results of significant diagnostics from this hospitalization (including imaging, microbiology, ancillary and laboratory) are listed below for reference.     Microbiology: Recent Results (from the past 240 hours)  Aerobic/Anaerobic Culture w Gram Stain (surgical/deep wound)     Status: None   Collection Time: 05/24/24  5:07 PM   Specimen: Wound; Abscess  Result Value Ref Range Status   Specimen Description   Final    WOUND Performed at [facility], [address], [place], [place] [address]    Special Requests   Final    NONE Performed at [facility], [address]  [place], [place] [address]    Gram Stain   Final    RARE WBC PRESENT, PREDOMINANTLY PMN NO ORGANISMS SEEN    Culture   Final    No growth aerobically or anaerobically. Performed at [facility], [address] [place], [place] [address]    Report Status 05/29/2024 FINAL  Final  Aerobic/Anaerobic Culture w Gram Stain (surgical/deep wound)     Status: None   Collection Time: 05/24/24  5:12 PM   Specimen: Wound; Abscess  Result Value Ref Range Status   Specimen Description   Final    WOUND Performed at [facility], [address] [place] [address] [place], [place] [address]    Special Requests   Final    NONE Performed at [facility], [address] [place], [place] [address]    Gram Stain   Final    RARE WBC PRESENT, PREDOMINANTLY PMN NO ORGANISMS SEEN    Culture   Final    No growth aerobically or anaerobically. Performed at [facility], [address] [place], [place] [address]    Report Status 05/29/2024 FINAL  Final  Culture, blood (Routine X 2) w Reflex to ID Panel     Status: None   Collection Time: 05/24/24 10:01 PM   Specimen: BLOOD  Result Value Ref Range Status   Specimen Description   Final    BLOOD BLOOD RIGHT ARM Performed at [facility], [address], [place], [place] [address]    Special Requests   Final    BOTTLES DRAWN AEROBIC AND ANAEROBIC Blood Culture results may not be optimal due to an inadequate volume of blood received in culture bottles Performed at [facility], [address], [place], [place] [address]    Culture   Final    NO GROWTH 6 DAYS Performed at [name], [address] [place], [place] [address]    Report Status 05/30/2024 FINAL  Final  Culture, blood (Routine X 2) w Reflex to ID Panel     Status: None   Collection Time: 05/24/24 10:01 PM   Specimen: BLOOD  Result Value Ref Range Status   Specimen Description   Final    BLOOD BLOOD RIGHT HAND Performed at [facility],  [address], [place], [place] [address]    Special Requests   Final    BOTTLES DRAWN AEROBIC AND ANAEROBIC Blood Culture results may not be optimal due to an inadequate volume of blood received in culture bottles Performed at [facility], [name] [place], [place] [address]    Culture   Final    NO GROWTH 6 DAYS Performed at [facility], [address] [place], [place] [address]    Report Status 05/30/2024 FINAL  Final     Labs: BNP (last 3 results) No results for input(s): BNP in the last 8760 hours. Basic Metabolic Panel: Recent Labs  Lab 05/26/24 0344 05/27/24 0505 05/28/24 0352 05/29/24 0325 05/30/24 0541  NA 134* 132* 129* 130* 130*  K 3.4* 3.8 3.5 3.7 3.8  CL 103 98 97* 100 97*  CO2 23 25 24 22 23   GLUCOSE 101* 105* 105* 106* 97  BUN 20 18 17 18 20   CREATININE 0.90 0.70 0.80 0.75 0.66  CALCIUM 8.6* 8.7* 8.4* 8.5* 8.4*   Liver Function Tests: Recent Labs  Lab 05/25/24 0513  AST 21  ALT 21  ALKPHOS 91  BILITOT 0.7  PROT 7.0  ALBUMIN  3.4*   No results for input(s): LIPASE, AMYLASE in the last 168 hours. No results for input(s): AMMONIA in the last 168 hours. CBC: Recent Labs  Lab 05/26/24 0344 05/27/24 0505 05/28/24 0352 05/29/24 0325 05/30/24 0541  WBC 13.3* 13.7* 14.2* 14.7* 11.9*  NEUTROABS 9.9* 10.8* 11.7* 11.8*  --   HGB 12.0* 12.9* 12.3* 12.0* 11.8*  HCT 35.0* 37.9* 35.6* 34.8* 33.8*  MCV 98.9 100.8* 98.9 97.8 96.3  PLT 273 276 258 273 314   Cardiac Enzymes: No results for input(s): CKTOTAL, CKMB, CKMBINDEX, TROPONINI in the last 168 hours. BNP: Invalid input(s): POCBNP CBG: Recent Labs  Lab 05/26/24 2138  GLUCAP 120*   D-Dimer No results for input(s): DDIMER in the last 72 hours. Hgb A1c No results for input(s): HGBA1C in the last 72 hours. Lipid Profile No results for input(s): CHOL, HDL, LDLCALC, TRIG, CHOLHDL, LDLDIRECT in the last 72 hours. Thyroid  function  studies No results for input(s): TSH, T4TOTAL, T3FREE, THYROIDAB in the last 72 hours.  Invalid input(s): FREET3 Anemia work up No results for input(s): VITAMINB12, FOLATE, FERRITIN, TIBC, IRON, RETICCTPCT in the last 72 hours. Urinalysis    Component Value Date/Time   COLORURINE YELLOW (A) 05/29/2024 1955   APPEARANCEUR CLEAR (A) 05/29/2024 1955   APPEARANCEUR Clear 12/08/2014 1450   LABSPEC 1.017 05/29/2024 1955   LABSPEC 1.043 12/08/2014 1450   PHURINE 5.0 05/29/2024 1955   GLUCOSEU NEGATIVE 05/29/2024 1955   GLUCOSEU Negative 12/08/2014 1450   HGBUR NEGATIVE 05/29/2024 1955   BILIRUBINUR NEGATIVE 05/29/2024 1955   BILIRUBINUR Negative 12/08/2014 1450   KETONESUR NEGATIVE 05/29/2024 1955   PROTEINUR 30 (A) 05/29/2024 1955   NITRITE NEGATIVE 05/29/2024 1955   LEUKOCYTESUR NEGATIVE 05/29/2024 1955   LEUKOCYTESUR Negative 12/08/2014 1450   Sepsis Labs Recent Labs  Lab 05/27/24 0505 05/28/24 0352 05/29/24 0325 05/30/24 0541  WBC 13.7* 14.2* 14.7* 11.9*   Microbiology Recent Results (from the past 240 hours)  Aerobic/Anaerobic Culture w Gram Stain (surgical/deep wound)     Status: None   Collection Time: 05/24/24  5:07 PM   Specimen: Wound; Abscess  Result Value Ref Range Status   Specimen Description   Final    WOUND Performed at [facility], [address] [place], [place] [address]    Special Requests   Final    NONE Performed at [facility], [address] [place], [place] [address]    Gram Stain   Final    RARE WBC PRESENT, PREDOMINANTLY PMN NO ORGANISMS SEEN    Culture   Final    No growth aerobically or anaerobically. Performed at [facility], [name] [place], [place] [address]    Report Status 05/29/2024 FINAL  Final  Aerobic/Anaerobic Culture w Gram Stain (surgical/deep wound)     Status: None   Collection Time: 05/24/24  5:12 PM   Specimen: Wound; Abscess  Result Value Ref Range Status    Specimen Description   Final    WOUND Performed at [facility], [address] [place], [place] [address]    Special Requests   Final    NONE Performed at [facility], [address] [place], [place] [address]    Gram Stain   Final    RARE WBC PRESENT, PREDOMINANTLY PMN NO ORGANISMS SEEN    Culture   Final    No growth aerobically or anaerobically. Performed at [facility], [address] [place], [place] [address]  Report Status 05/29/2024 FINAL  Final  Culture, blood (Routine X 2) w Reflex to ID Panel     Status: None   Collection Time: 05/24/24 10:01 PM   Specimen: BLOOD  Result Value Ref Range Status   Specimen Description   Final    BLOOD BLOOD RIGHT ARM Performed at [facility], [address] [place], [place] [address]    Special Requests   Final    BOTTLES DRAWN AEROBIC AND ANAEROBIC Blood Culture results may not be optimal due to an inadequate volume of blood received in culture bottles Performed at [facility], [address], [place], [place] [address]    Culture   Final    NO GROWTH 6 DAYS Performed at [facility], [address] [place], [place] [address]    Report Status 05/30/2024 FINAL  Final  Culture, blood (Routine X 2) w Reflex to ID Panel     Status: None   Collection Time: 05/24/24 10:01 PM   Specimen: BLOOD  Result Value Ref Range Status   Specimen Description   Final    BLOOD BLOOD RIGHT HAND Performed at [facility], [address] [place], [place] [address]    Special Requests   Final    BOTTLES DRAWN AEROBIC AND ANAEROBIC Blood Culture results may not be optimal due to an inadequate volume of blood received in culture bottles Performed at [facility], [address] [place], [place] [address]    Culture   Final    NO GROWTH 6 DAYS Performed at [facility], [address], [place], [place] [address]    Report Status 05/30/2024 FINAL  Final    Imaging DG Hand Complete Right Result Date: 05/24/2024 CLINICAL DATA:  Fifth finger swelling since awakening yesterday. EXAM: RIGHT HAND - COMPLETE 3+ VIEW COMPARISON:  Wrist radiographs 09/08/2021. FINDINGS: No evidence of acute fracture, dislocation or acute bone destruction. Incomplete healing of previously demonstrated fracture of the radial styloid. There are mild-to-moderate degenerative changes at the proximal interphalangeal joint of the small finger. Questionable erosion of the neck of the 5th proximal phalanx. Additional degenerative changes within the wrist and at the 1st carpometacarpal joint. There are scattered capsular calcifications as well as TFCC calcification. Soft tissue swelling noted in the small finger without evidence of foreign body or soft tissue emphysema. IMPRESSION: 1. Nonspecific soft tissue swelling in the small finger without evidence of foreign body or acute bone destruction. 2. Degenerative changes at the proximal interphalangeal joint of the small finger with questionable erosion of the neck of the 5th proximal phalanx. 3. Incomplete healing of previously demonstrated fracture of the radial styloid. Electronically Signed   By: Elsie Perone M.D.   On: 05/24/2024 12:44      Time coordinating discharge: over 30 minutes  SIGNED:  Rudell Marlowe DO [facility]

## 2024-05-31 NOTE — ED Provider Notes (Signed)
 [facility] Provider Note   Event Date/Time   First MD Initiated Contact with Patient 05/31/24 2011     (approximate) History  Hypertension  HPI [name]. is a 89 y.o. male with recent past medical history of flexor tenosynovitis on the right hand that required surgery on 05/24/2024 and subsequent stay in in the hospital until he was discharged earlier today.  Patient arrives via EMS due to hypertension as well as inability of family to care for him at home.  Patient's wife and son at bedside stating that they have been unable to get patient out of bed without significant assistance and he has been unable to ambulate since he has been home.  Family states that there is an order for a home health nurse to start on Monday however they are not going to be there all day and family does not feel comfortable based on patient's needs.  Patient did have an episode of high blood pressure at home with systolics in the 240s however patient's systolic is 144 on reassessment during my evaluation.  Patient was supposed to be on Norvasc  however there is question as to whether he took it today but for being discharged ROS: Patient currently denies any vision changes, tinnitus, difficulty speaking, facial droop, sore throat, chest pain, shortness of breath, abdominal pain, nausea/vomiting/diarrhea, dysuria, or weakness/numbness/paresthesias in any extremity   Physical Exam  Triage Vital Signs: ED Triage Vitals  Encounter Vitals Group     BP 05/31/24 2017 (!) 164/111     Girls Systolic BP Percentile --      Girls Diastolic BP Percentile --      Boys Systolic BP Percentile --      Boys Diastolic BP Percentile --      Pulse Rate 05/31/24 2017 84     Resp 05/31/24 2017 16     Temp 05/31/24 2017 99.1 F (37.3 C)     Temp Source 05/31/24 2017 Oral     SpO2 05/31/24 2017 100 %     Weight --      Height --      Head Circumference --      Peak Flow --      Pain Score 05/31/24  2018 0     Pain Loc --      Pain Education --      Exclude from Growth Chart --    Most recent vital signs: Vitals:   05/31/24 2030 05/31/24 2119  BP: (!) 164/91 (!) 165/100  Pulse: 88   Resp:    Temp:    SpO2: 100%    General: Awake, oriented x4. CV:  Good peripheral perfusion. Resp:  Normal effort. Abd:  No distention. Other:  Elderly well-developed, well-nourished Caucasian male resting comfortably in no acute distress.  Dressing in place to 4th and 5th digit of the right hand ED Results / Procedures / Treatments  Labs (all labs ordered are listed, but only abnormal results are displayed) Labs Reviewed  COMPREHENSIVE METABOLIC PANEL WITH GFR  CBC WITH DIFFERENTIAL/PLATELET  URINALYSIS, ROUTINE W REFLEX MICROSCOPIC  TROPONIN I (HIGH SENSITIVITY)   PROCEDURES: Critical Care performed: No Procedures MEDICATIONS ORDERED IN ED: Medications  QUEtiapine  (SEROQUEL ) tablet 25 mg (25 mg Oral Given 05/31/24 2202)  amLODipine  (NORVASC ) tablet 5 mg (5 mg Oral Given 05/31/24 2119)   IMPRESSION / MDM / ASSESSMENT AND PLAN / ED COURSE  I reviewed the triage vital signs and the nursing notes.  The patient is on the cardiac monitor to evaluate for evidence of arrhythmia and/or significant heart rate changes. Patient's presentation is most consistent with acute presentation with potential threat to life or bodily function. Patient is an 89 year old male who presents for hypertension and inability to be cared for at home.  Patient's hypertension has resolved at this time.  Patient was given home Norvasc .  At this time I have low suspicion for intracranial hemorrhage, ACS, aortic pathology, pres Family is very concerned as they do not feel comfortable caring for patient at home at this time.  Even with the advent of home health coming on Monday, they do not feel qualified or able to care for patient adequately over the next 24 hours or when the home health care nurse  is not present.  Patient's family is requesting another physical and Occupational Therapy evaluation given that patient's activity and strength is not what it was when he was evaluated last time.  Patient patient is intermittently confused however family states that this is baseline for him. Dispo: Boarder   FINAL CLINICAL IMPRESSION(S) / ED DIAGNOSES   Final diagnoses:  Primary hypertension   Rx / DC Orders   ED Discharge Orders     None      Note:  This document was prepared using Dragon voice recognition software and may include unintentional dictation errors.   Tino Ronan K, MD 05/31/24 2300

## 2024-05-31 NOTE — ED Triage Notes (Addendum)
 BIB EMS after wife called out for hypertension. Patient was discharged from here this morning after a week stay for a surgical procedure on his hand and he is not sure if he has had any of his home medications, including his BP meds. Patient denies any symptoms at this time. EMS reports a BP of 199/102 in route.

## 2024-05-31 NOTE — Progress Notes (Signed)
 Patient discharged home with spouse.Writer reviewed home care instructions with spouse and has no further questions. Waiting for [facility] to be delivered at this time. Patient has all belongings and will be transported to front lobby via wheelchair. Sister driving patient home.

## 2024-05-31 NOTE — Progress Notes (Signed)
 Per spouse [facility] will be delivered to her home this evening as she spoke to the company directly.

## 2024-06-01 LAB — URINALYSIS, ROUTINE W REFLEX MICROSCOPIC
Bacteria, UA: NONE SEEN
Bilirubin Urine: NEGATIVE
Glucose, UA: NEGATIVE mg/dL
Ketones, ur: NEGATIVE mg/dL
Leukocytes,Ua: NEGATIVE
Nitrite: NEGATIVE
Protein, ur: NEGATIVE mg/dL
Specific Gravity, Urine: 1.015 (ref 1.005–1.030)
pH: 6 (ref 5.0–8.0)

## 2024-06-01 LAB — CBC WITH DIFFERENTIAL/PLATELET
Abs Immature Granulocytes: 0.07 K/uL (ref 0.00–0.07)
Basophils Absolute: 0 K/uL (ref 0.0–0.1)
Basophils Relative: 0 %
Eosinophils Absolute: 0.1 K/uL (ref 0.0–0.5)
Eosinophils Relative: 1 %
HCT: 34.3 % — ABNORMAL LOW (ref 39.0–52.0)
Hemoglobin: 12 g/dL — ABNORMAL LOW (ref 13.0–17.0)
Immature Granulocytes: 1 %
Lymphocytes Relative: 14 %
Lymphs Abs: 1.5 K/uL (ref 0.7–4.0)
MCH: 34.1 pg — ABNORMAL HIGH (ref 26.0–34.0)
MCHC: 35 g/dL (ref 30.0–36.0)
MCV: 97.4 fL (ref 80.0–100.0)
Monocytes Absolute: 1.4 K/uL — ABNORMAL HIGH (ref 0.1–1.0)
Monocytes Relative: 13 %
Neutro Abs: 7.8 K/uL — ABNORMAL HIGH (ref 1.7–7.7)
Neutrophils Relative %: 71 %
Platelets: 331 K/uL (ref 150–400)
RBC: 3.52 MIL/uL — ABNORMAL LOW (ref 4.22–5.81)
RDW: 13.3 % (ref 11.5–15.5)
WBC: 10.9 K/uL — ABNORMAL HIGH (ref 4.0–10.5)
nRBC: 0 % (ref 0.0–0.2)

## 2024-06-01 LAB — COMPREHENSIVE METABOLIC PANEL WITH GFR
ALT: 24 U/L (ref 0–44)
AST: 27 U/L (ref 15–41)
Albumin: 2.7 g/dL — ABNORMAL LOW (ref 3.5–5.0)
Alkaline Phosphatase: 95 U/L (ref 38–126)
Anion gap: 8 (ref 5–15)
BUN: 19 mg/dL (ref 8–23)
CO2: 22 mmol/L (ref 22–32)
Calcium: 8.3 mg/dL — ABNORMAL LOW (ref 8.9–10.3)
Chloride: 101 mmol/L (ref 98–111)
Creatinine, Ser: 0.67 mg/dL (ref 0.61–1.24)
GFR, Estimated: >60 mL/min (ref >60)
Glucose, Bld: 107 mg/dL — ABNORMAL HIGH (ref 70–99)
Potassium: 3.5 mmol/L (ref 3.5–5.1)
Sodium: 131 mmol/L — ABNORMAL LOW (ref 135–145)
Total Bilirubin: 0.8 mg/dL (ref 0.0–1.2)
Total Protein: 6.9 g/dL (ref 6.5–8.1)

## 2024-06-01 LAB — TROPONIN I (HIGH SENSITIVITY): Troponin I (High Sensitivity): 9 ng/L (ref <18)

## 2024-06-01 NOTE — Evaluation (Signed)
 Occupational Therapy Evaluation Patient Details Name: [name]. MRN: [id number] DOB: 03/24/1932 Today's Date: 06/01/2024   History of Present Illness   Fabio Wah. is a 89 y.o. male with recent past medical history of flexor tenosynovitis on the right hand that required surgery on 05/24/2024, discharged ~1 week later and retuned on the same day due to hypertension as well as inability of family to care for him at home.  Family reports they have been unable to get patient out of bed without significant assistance and he has been unable to ambulate since he has been home. Patient did have an episode of high blood pressure at home with systolics in the 240s however patient's systolic is 144 on reassessment during my evaluation.  Patient was supposed to be on Norvasc  however there is question as to whether he took it today but for being discharged  ROS: Patient currently denies any vision changes, tinnitus, difficulty speaking, facial droop, sore throat, chest pain, shortness of breath, abdominal pain, nausea/vomiting/diarrhea, dysuria, or weakness/numbness/paresthesias in any extremity.     Clinical Impressions Pt discharged from hospital yesterday, but apparently required assistance transferring from car and into house upon arrival home; wife was not able to provide level of assistance needed to get pt into the house, so called EMS to return pt to hospital. During today's session, pt is able to transfer supine<sit with Mod A -- requiring assistance for trunk control and repositioning b/l LE. Pt's spouse is not able to provide the level of assistance pt requires for getting out of bed. He could come into standing with RW w/o physical assistance but with verbal cueing for hand placement on RW. Pt ambulated to bathroom, but began urinating and defecating before he reached the toilet. Was able to transfer sit<>stand from toileting with SUPV/CGA for safety but no physical assistance required.  Did need Min A for LB dressing (changing socks and pulling up Depends), anticipate that pt's spouse would be able to provide this level of assistance. Pt completed pericare in standing, washed hands, w/ CGA-Min A and several VCs for safe use of RW (pt positioning walker to his side rather than in front home him and picking it up rather than pushing it). It is unlikely that spouse would be able to provide this sort of cueing. Pt returned to bed without physical assistance, able to raise LE to bed level with Mod I. Required verbal cueing for positioning self in center of bed, but then able to complete this movement slowly bu INDly. Pt is making good progress in regaining strength, endurance, and functional mobility but is not near his baseline level of fxl mobility and does continue to need assistance for transfers and ADLs and for cueing PRN. Recommend ongoing OT while pt is hospitalized. Pt will need additional rehabilitation services before he would be safe to return home. Recommend skilled services 5x/wk, < 3 hrs/day. Pt's spouse is unable to meet pt's needs at present; if pt returned home in current condition, he would be at high risk for falls, further decline, and likelihood of return to hospital.     If plan is discharge home, recommend the following:   A little help with bathing/dressing/bathroom;Assistance with cooking/housework;Assist for transportation;Help with stairs or ramp for entrance;A little help with walking and/or transfers     Functional Status Assessment   Patient has had a recent decline in their functional status and demonstrates the ability to make significant improvements in function in a reasonable and  predictable amount of time.     Equipment Recommendations   Tub/shower seat     Recommendations for Other Services         Precautions/Restrictions   Precautions Precautions: Fall Restrictions Weight Bearing Restrictions Per Provider Order: Yes RUE Weight  Bearing Per Provider Order: Weight bearing as tolerated Other Position/Activity Restrictions: WBAT to R hand per Dr. Tobie     Mobility Bed Mobility Overal bed mobility: Needs Assistance Bed Mobility: Supine to Sit, Sit to Supine     Supine to sit: Mod assist Sit to supine: Supervision   General bed mobility comments: Mod A for repositioning LE during supine<sit. Pt able to perform sit<supine w/ increased time but no physical assistance, able to lift LE to bed level with Mod I. Patient Response: Anxious  Transfers Overall transfer level: Needs assistance Equipment used: Rolling walker (2 wheels) Transfers: Sit to/from Stand Sit to Stand: Supervision, Contact guard assist           General transfer comment: Able to transfer sit<>stand from bed and toilet with SUPV-CGA but no physical assistance. 2 verbal cues needed for safe positioning of RW      Balance Overall balance assessment: Needs assistance Sitting-balance support: Single extremity supported Sitting balance-Leahy Scale: Fair     Standing balance support: Bilateral upper extremity supported, During functional activity Standing balance-Leahy Scale: Fair Standing balance comment: RW for support                           ADL either performed or assessed with clinical judgement   ADL Overall ADL's : Needs assistance/impaired     Grooming: Wash/dry hands;Supervision/safety;Cueing for sequencing Grooming Details (indicate cue type and reason): with LUE only             Lower Body Dressing: Contact guard assist;Minimal assistance Lower Body Dressing Details (indicate cue type and reason): Pt able to don and doff socks in sitting with CGA. Min A for donning depends. Toilet Transfer: Minimal assistance   Toileting- Clothing Manipulation and Hygiene: Minimal assistance Toileting - Clothing Manipulation Details (indicate cue type and reason): Pt able to complete pericare in standing post BM and to pull  up depends, with Min A.       General ADL Comments: SUPV/CGA for ADL mobility with RW.     Vision         Perception         Praxis         Pertinent Vitals/Pain Pain Assessment Pain Assessment: No/denies pain     Extremity/Trunk Assessment Upper Extremity Assessment Upper Extremity Assessment: Overall WFL for tasks assessed RUE Deficits / Details: 5th finger bandaged, able to WB through RW RUE Coordination: decreased fine motor   Lower Extremity Assessment Lower Extremity Assessment: Generalized weakness (chronic R knee issues, lacking full ROM - generalized weakness.  Low grade buckling with increased stance time)       Communication Communication Communication: Impaired Factors Affecting Communication: Hearing impaired   Cognition Arousal: Lethargic Behavior During Therapy: WFL for tasks assessed/performed               OT - Cognition Comments: Requires increased time for processing/problem-solving.                 Following commands: Impaired Following commands impaired: Follows one step commands with increased time, Follows one step commands inconsistently     Cueing  General Comments   Cueing Techniques: Verbal cues  Pt is weak and significantly limited as compared to his baseline   Exercises Other Exercises Other Exercises: Educ re: role of OT, POC, home safety. Extended discussion with pt's wife and sister re: obtaining PRN assistance at home.   Shoulder Instructions      Home Living Family/patient expects to be discharged to:: Unsure Living Arrangements: Spouse/significant other Available Help at Discharge: Family;Available 24 hours/day Type of Home: House Home Access: Stairs to enter [employer] of Steps: 4 Entrance Stairs-Rails: Right Home Layout: One level     Bathroom Shower/Tub: Walk-in shower         Home Equipment: Cane - Programmer, applications (2 wheels)   Additional Comments: Wife is home 24/7 but  limited in care she is able to provide, 2/2 to her own health conditions. She cannot offer the pt physical support for transfers and ambulation.      Prior Functioning/Environment Prior Level of Function : Independent/Modified Independent;History of Falls (last six months)             Mobility Comments: apparently never used a walker prior to last week, family also reports that He falls all the time ADLs Comments: Ind with ADLs, spouse assists with med mgt    OT Problem List: Decreased strength;Decreased coordination;Pain;Decreased activity tolerance;Decreased safety awareness;Impaired balance (sitting and/or standing);Decreased knowledge of use of DME or AE;Impaired UE functional use;Decreased knowledge of precautions;Decreased range of motion   OT Treatment/Interventions: Self-care/ADL training;Therapeutic exercise;Therapeutic activities;DME and/or AE instruction;Patient/family education;Balance training;Manual therapy      OT Goals(Current goals can be found in the care plan section)   Acute Rehab OT Goals Patient Stated Goal: to regain strength, return to PLOF OT Goal Formulation: With patient/family Time For Goal Achievement: 06/15/24 Potential to Achieve Goals: Good ADL Goals Pt Will Perform Grooming: with modified independence;standing Pt Will Transfer to Toilet: with modified independence;stand pivot transfer (using LRAD) Additional ADL Goal #1: Pt will perform bed mobility (supine<>sit) with Mod I. Additional ADL Goal #2: Pt will demonstrate safe use of RW.   OT Frequency:  Min 2X/week    Co-evaluation              AM-PAC OT 6 Clicks Daily Activity     Outcome Measure Help from another person eating meals?: None Help from another person taking care of personal grooming?: A Little Help from another person toileting, which includes using toliet, bedpan, or urinal?: A Lot Help from another person bathing (including washing, rinsing, drying)?: A Little Help  from another person to put on and taking off regular upper body clothing?: A Little Help from another person to put on and taking off regular lower body clothing?: A Little 6 Click Score: 18   End of Session Equipment Utilized During Treatment: Rolling walker (2 wheels)  Activity Tolerance:   Patient left: in bed;with call bell/phone within reach;with family/visitor present;with nursing/sitter in room  OT Visit Diagnosis: Other abnormalities of gait and mobility (R26.89);Repeated falls (R29.6);Muscle weakness (generalized) (M62.81)                Time: 0902-1000 OT Time Calculation (min): 58 min Charges:  OT General Charges $OT Visit: 1 Visit OT Evaluation $OT Eval Moderate Complexity: 1 Mod OT Treatments $Self Care/Home Management : 38-52 mins Suzen Hock, PhD, MS, OTR/L 06/01/24, 1:44 PM

## 2024-06-01 NOTE — ED Provider Notes (Signed)
-----------------------------------------   6:21 AM on 06/01/2024 -----------------------------------------   Blood pressure 137/75, pulse 95, temperature 99.1 F (37.3 C), temperature source Oral, resp. rate 18, SpO2 99%.  The patient is calm and cooperative at this time.  There have been no acute events since the last update.  Awaiting disposition plan from case management/social work.  Labs have been unremarkable.  Mild leukocytosis but has been recently treated for flexor tenosynovitis.  No fever.  Blood pressures have improved with his home medications.  Normal creatinine.  Negative troponin.  No medical indication at this time for readmission.  Home medications have been reordered.   Cloris Flippo, Josette SAILOR, DO 06/01/24 [phone]

## 2024-06-01 NOTE — ED Notes (Signed)
 Patient cleaned up from an episode of bowel and bladder incontinence. Brief placed on patient at this time. Bed low and locked and call bell in reach.

## 2024-06-01 NOTE — Evaluation (Signed)
 Physical Therapy Evaluation Patient Details Name: [name]. MRN: [id number] DOB: [birth year] Today's Date: 06/01/2024  History of Present Illness  [name]. is a 89 y.o. male with recent past medical history of flexor tenosynovitis on the right hand that required surgery on 05/24/2024, discharged ~1 week later and retuned on the same day due to hypertension as well as inability of family to care for him at home.  Family reports they have been unable to get patient out of bed without significant assistance and he has been unable to ambulate since he has been home. Patient did have an episode of high blood pressure at home with systolics in the 240s however patient's systolic is 144 on reassessment during my evaluation.  Patient was supposed to be on Norvasc  however there is question as to whether he took it today but for being discharged  ROS: Patient currently denies any vision changes, tinnitus, difficulty speaking, facial droop, sore throat, chest pain, shortness of breath, abdominal pain, nausea/vomiting/diarrhea, dysuria, or weakness/numbness/paresthesias in any extremity.  Clinical Impression  Pt sleeping on arrival but wakes easily with wife's assistance.  Pt reports I feel fine but was weak and functionally limited, especially with bed mobility tasks.  He did manage to stand and do ~45 ft of ambulation in the room with walker but needed constant cuing, redirection and generally did not show good awareness with the walker.  Pt's vitals were stable/appropriate with the effort.  Pt with general LE weakness, R knee with chronic ROM issues - started having some low grade buckling in knees with increased up time.  Pt is far from his baseline function, will need continued PT to address functional limitations.        If plan is discharge home, recommend the following: A little help with walking and/or transfers;A little help with bathing/dressing/bathroom;Assistance with  cooking/housework;Direct supervision/assist for medications management;Supervision due to cognitive status;Assist for transportation;Help with stairs or ramp for entrance   Can travel by private vehicle   No    Equipment Recommendations Rolling walker (2 wheels)  Recommendations for Other Services       Functional Status Assessment Patient has had a recent decline in their functional status and demonstrates the ability to make significant improvements in function in a reasonable and predictable amount of time.     Precautions / Restrictions Precautions Precautions: Fall Restrictions RUE Weight Bearing Per Provider Order: Weight bearing as tolerated      Mobility  Bed Mobility Overal bed mobility: Needs Assistance Bed Mobility: Supine to Sit, Rolling Rolling: Min assist   Supine to sit: Max assist, Mod assist     General bed mobility comments: Pt needed hands on assist to get UEs to bed rail to turn and change wet pull up, once there he did maintain sidelying w/o direct assist using bed rail.  Pt needed heavy assist to get to sitting EOB, showed good effort but completely unable to intiaite upward trunk movement.    Transfers Overall transfer level: Needs assistance Equipment used: Rolling walker (2 wheels) Transfers: Sit to/from Stand Sit to Stand: Contact guard assist, Min assist           General transfer comment: pt unable to get to standing with self selected strategy needed heavy cuing for U&LE set up, weight shifting and sequencing to rise in the walker with minimal physical assist    Ambulation/Gait Ambulation/Gait assistance: Contact guard assist Gait Distance (Feet): 45 Feet Assistive device: Rolling walker (2 wheels)  General Gait Details: Pt managed a few small loops in the room with walker and close CGA but needed almost constant directional, tactile and gait cues to stay inside walker correctly/safely.  Pt's foot got caught up in on R walker  leg at one point and in general he showed poor walker management awareness, especially during turns.  VSS t/o  Stairs            Wheelchair Mobility     Tilt Bed    Modified Rankin (Stroke Patients Only)       Balance Overall balance assessment: Needs assistance Sitting-balance support: Single extremity supported Sitting balance-Leahy Scale: Fair     Standing balance support: Bilateral upper extremity supported, During functional activity Standing balance-Leahy Scale: Fair Standing balance comment: RW for support - poor general safety awareness without overt LOBs.  some low grade knee buckling with increased stance time                             Pertinent Vitals/Pain Pain Assessment Pain Assessment: No/denies pain    Home Living Family/patient expects to be discharged to:: Unsure Living Arrangements: Spouse/significant other Available Help at Discharge: Family;Available 24 hours/day Type of Home: House Home Access: Stairs to enter Entrance Stairs-Rails: Right Entrance Stairs-Number of Steps: 4   Home Layout: One level Home Equipment: Cane - Programmer, applications (2 wheels) Additional Comments: Wife is home 24/7 but limited in care she is able to provide, 2/2 to her own health conditions. She cannot offer the pt physical support for transfers and ambulation.    Prior Function Prior Level of Function : Independent/Modified Independent;History of Falls (last six months)             Mobility Comments: apparently never used a walker prior to last week, family also reports that He falls all the time ADLs Comments: Ind with ADLs, spouse assists with med mgt     Extremity/Trunk Assessment   Upper Extremity Assessment Upper Extremity Assessment: Overall WFL for tasks assessed RUE Deficits / Details: 5th finger bandaged, able to WB through RW RUE Coordination: decreased fine motor    Lower Extremity Assessment Lower Extremity Assessment:  Generalized weakness (chronic R knee issues, lacking full ROM - generalized weakness.  Low grade buckling with increased stance time)       Communication   Communication Communication: Impaired Factors Affecting Communication: Hearing impaired    Cognition Arousal: Lethargic Behavior During Therapy: WFL for tasks assessed/performed   PT - Cognitive impairments: Memory, Awareness                       PT - Cognition Comments: cues for safety and awarenss Following commands: Impaired Following commands impaired: Follows one step commands inconsistently     Cueing Cueing Techniques: Verbal cues     General Comments General comments (skin integrity, edema, etc.): Pt is weak and significantly limited as compared to his baseline    Exercises     Assessment/Plan    PT Assessment Patient needs continued PT services  PT Problem List Decreased strength;Decreased activity tolerance;Decreased balance;Decreased mobility;Decreased knowledge of use of DME       PT Treatment Interventions DME instruction;Gait training;Stair training;Functional mobility training;Therapeutic activities;Therapeutic exercise;Balance training;Patient/family education    PT Goals (Current goals can be found in the Care Plan section)  Acute Rehab PT Goals Patient Stated Goal: Improved balance PT Goal Formulation: With patient/family Time For Goal Achievement: 06/14/24  Potential to Achieve Goals: Fair    Frequency Min 2X/week     Co-evaluation               AM-PAC PT 6 Clicks Mobility  Outcome Measure Help needed turning from your back to your side while in a flat bed without using bedrails?: A Lot Help needed moving from lying on your back to sitting on the side of a flat bed without using bedrails?: A Lot Help needed moving to and from a bed to a chair (including a wheelchair)?: A Little Help needed standing up from a chair using your arms (e.g., wheelchair or bedside chair)?: A  Little Help needed to walk in hospital room?: A Little Help needed climbing 3-5 steps with a railing? : A Lot 6 Click Score: 15    End of Session Equipment Utilized During Treatment: Gait belt Activity Tolerance: Patient limited by fatigue;Patient tolerated treatment well Patient left: in chair;with call bell/phone within reach;with family/visitor present Nurse Communication: Mobility status PT Visit Diagnosis: History of falling (Z91.81);Muscle weakness (generalized) (M62.81);Difficulty in walking, not elsewhere classified (R26.2)    Time: 8856-8787 PT Time Calculation (min) (ACUTE ONLY): 29 min   Charges:   PT Evaluation $PT Eval Low Complexity: 1 Low PT Treatments $Gait Training: 8-22 mins PT General Charges $$ ACUTE PT VISIT: 1 Visit         Carmin JONELLE Deed, DPT 06/01/2024, 1:07 PM

## 2024-06-01 NOTE — ED Notes (Signed)
 Pts brief was found wet. New brief and bed pads applied

## 2024-06-02 NOTE — ED Notes (Signed)
 New brief and chux pads placed at this time. Mepilex pad applied to sacral area.

## 2024-06-02 NOTE — ED Notes (Signed)
 Mobility at bedside

## 2024-06-02 NOTE — ED Notes (Addendum)
 Stood patient up and pivoted to bed with walker. X1-2 assist needed. Pt unable to stand without help.

## 2024-06-02 NOTE — ED Notes (Signed)
 Pt transferred from chair to bed using the walker and 2x assist. Soiled brief changed. Hospital bed provided. Call light within reach. Bed alarm on.

## 2024-06-02 NOTE — Progress Notes (Signed)
 Mobility Specialist - Progress Note     06/02/24 1000  Mobility  Activity Ambulated with assistance in hallway;Stood at bedside;Dangled on edge of bed  Level of Assistance Minimal assist, patient does 75% or more  Assistive Device Front wheel walker  Distance Ambulated (ft) 100 ft  Range of Motion/Exercises Active  RUE Weight Bearing Per Provider Order WBAT  Activity Response Tolerated fair  Mobility Referral Yes  Mobility visit 1 Mobility  Mobility Specialist Start Time (ACUTE ONLY) 0950  Mobility Specialist Stop Time (ACUTE ONLY) 1025  Mobility Specialist Time Calculation (min) (ACUTE ONLY) 35 min   Pt resting in bed on RA upon entry. Pt moved to EOB.  Initially upon sitting pt leaning heavily to right side and needed mod assist to remain upright.  He was able to gain balance after several minutes of assist.  Stood to RW with mod assist.  Pt swaying right upon standing and needed assist to prevent fall.  After several moments he was able to correct and gait was progressed.  While he was able to walk about 100' his gait quality was poor.  Decreased step length and height and is easily distracted by activity in hallway and by wife talking during session.  Knees buckling upon return to room and needed cues from writer to stop and rest.  He was unaware of safety deficits and pushed himself further than was safe.  Pt remains at high risk for falls and fall would have been likely without external assist and cues.  Upon return to room he needed cues for hand placements to sit safely in chair. Pt returned to recliner and left with needs in reach. Chair alarm activated.   Guido Rumble Mobility Specialist 06/02/24, 11:07 AM

## 2024-06-02 NOTE — TOC Transition Note (Signed)
 Transition of Care [facility]) - Discharge Note   Patient Details  Name: [name]. MRN: [id number] Date of Birth: 1932/01/08  Transition of Care [facility]) CM/SW Contact:  Marinda Cooks, RN Phone Number: 06/02/2024, 9:45 AM   Clinical Narrative:    Transition of Care [facility]) - Inpatient Brief Assessment   Patient Details  Name: [name]. MRN: [id number] Date of Birth: 03/14/1932  Transition of Care [facility]) CM/SW Contact:    Marinda Cooks, RN Phone Number: 06/02/2024, 9:46 AM   Clinical Narrative: Per chart review by this CM pt dc'd on 05/31/24 back home with wife , HH set up with [facility] , DME coordinated with [employer] for RW, pt's wife agreed to pay out of pocket for the 3 in 1 recommended that insurance would not cover. Pt readmitted for the family expressing the inability to care for him at home . Pt was denied SNF placement by insurance after having a Peer to Peer with prior admission. TOC will cont to follow dc planning / care coordination and update as applicable.   Transition of Care Asessment: Insurance and Status: Insurance coverage has been reviewed Patient has primary care physician: Yes Home environment has been reviewed: Pt lives with wife Prior level of function:: Independent with ADL'S Prior/Current Home Services: Current home services ([facility] coordinated with prior dc on 05/31/24) Social Drivers of Health Review: SDOH reviewed needs interventions Readmission risk has been reviewed: Yes Transition of care needs: transition of care needs identified, TOC will continue to follow         Patient Goals and CMS Choice      TBD      Discharge Placement  TBD                  Social Drivers of Health (SDOH) Interventions SDOH Screenings   Food Insecurity: No Food Insecurity (05/24/2024)  Housing: Low Risk  (05/24/2024)  Transportation Needs: No Transportation Needs (05/24/2024)  Utilities: Not At Risk (05/24/2024)  Financial Resource Strain:  Patient Declined (11/06/2023)   Received from [facility]  Social Connections: Moderately Integrated (05/24/2024)  Tobacco Use: Low Risk  (05/31/2024)     Readmission Risk Interventions     No data to display

## 2024-06-02 NOTE — ED Provider Notes (Addendum)
-----------------------------------------   7:50 AM on 06/02/2024 -----------------------------------------   Blood pressure (!) 143/71, pulse 65, temperature 98.1 F (36.7 C), temperature source Oral, resp. rate 18, SpO2 93%.  The patient is calm and cooperative at this time.  There have been no acute events since the last update.  Awaiting disposition plan from case management/social work.  Worked with physical therapy and Occupational Therapy yesterday.  Currently waiting for social work evaluation.  Blood pressure improved since being on blood pressure medication.  Discussed the need for a home hospital bed and home health if going home and not approved to go to a nursing facility    Suzanne Kirsch, MD 06/02/24 0750    Suzanne Kirsch, MD 06/02/24 [phone]

## 2024-06-02 NOTE — ED Notes (Addendum)
 Pt called out for soiled brief, pt soiled w/ urine. Pt cleaned and new brief placed. Pt warm to touch, axillary temp read 100.3. see MAR. Jossie, MD notified

## 2024-06-02 NOTE — ED Notes (Signed)
 Pt repositioned in bed at this time.   Fall bundle in place.

## 2024-06-03 ENCOUNTER — Emergency Department

## 2024-06-03 DIAGNOSIS — J984 Other disorders of lung: Secondary | ICD-10-CM | POA: Diagnosis not present

## 2024-06-03 DIAGNOSIS — J189 Pneumonia, unspecified organism: Secondary | ICD-10-CM | POA: Diagnosis not present

## 2024-06-03 DIAGNOSIS — J181 Lobar pneumonia, unspecified organism: Secondary | ICD-10-CM | POA: Insufficient documentation

## 2024-06-03 DIAGNOSIS — I10 Essential (primary) hypertension: Secondary | ICD-10-CM | POA: Diagnosis not present

## 2024-06-03 DIAGNOSIS — R509 Fever, unspecified: Secondary | ICD-10-CM | POA: Diagnosis not present

## 2024-06-03 DIAGNOSIS — R0989 Other specified symptoms and signs involving the circulatory and respiratory systems: Secondary | ICD-10-CM | POA: Diagnosis not present

## 2024-06-03 HISTORY — DX: Lobar pneumonia, unspecified organism: J18.1

## 2024-06-03 LAB — BASIC METABOLIC PANEL WITH GFR
Anion gap: 8 (ref 5–15)
BUN: 15 mg/dL (ref 8–23)
CO2: 23 mmol/L (ref 22–32)
Calcium: 8.6 mg/dL — ABNORMAL LOW (ref 8.9–10.3)
Chloride: 101 mmol/L (ref 98–111)
Creatinine, Ser: 0.58 mg/dL — ABNORMAL LOW (ref 0.61–1.24)
GFR, Estimated: >60 mL/min (ref >60)
Glucose, Bld: 100 mg/dL — ABNORMAL HIGH (ref 70–99)
Potassium: 3.7 mmol/L (ref 3.5–5.1)
Sodium: 132 mmol/L — ABNORMAL LOW (ref 135–145)

## 2024-06-03 LAB — CBC
HCT: 34.3 % — ABNORMAL LOW (ref 39.0–52.0)
Hemoglobin: 11.9 g/dL — ABNORMAL LOW (ref 13.0–17.0)
MCH: 33.9 pg (ref 26.0–34.0)
MCHC: 34.7 g/dL (ref 30.0–36.0)
MCV: 97.7 fL (ref 80.0–100.0)
Platelets: 394 K/uL (ref 150–400)
RBC: 3.51 MIL/uL — ABNORMAL LOW (ref 4.22–5.81)
RDW: 13.2 % (ref 11.5–15.5)
WBC: 11.9 K/uL — ABNORMAL HIGH (ref 4.0–10.5)
nRBC: 0 % (ref 0.0–0.2)

## 2024-06-03 LAB — RESP PANEL BY RT-PCR (RSV, FLU A&B, COVID)  RVPGX2
Influenza A by PCR: NEGATIVE
Influenza B by PCR: NEGATIVE
Resp Syncytial Virus by PCR: NEGATIVE
SARS Coronavirus 2 by RT PCR: NEGATIVE

## 2024-06-03 LAB — URINALYSIS, W/ REFLEX TO CULTURE (INFECTION SUSPECTED)
Bilirubin Urine: NEGATIVE
Glucose, UA: NEGATIVE mg/dL
Hgb urine dipstick: NEGATIVE
Ketones, ur: NEGATIVE mg/dL
Leukocytes,Ua: NEGATIVE
Nitrite: NEGATIVE
Protein, ur: NEGATIVE mg/dL
Specific Gravity, Urine: 1.016 (ref 1.005–1.030)
pH: 5 (ref 5.0–8.0)

## 2024-06-03 LAB — RESPIRATORY PANEL BY PCR

## 2024-06-03 LAB — MRSA NEXT GEN BY PCR, NASAL: MRSA by PCR Next Gen: NOT DETECTED

## 2024-06-03 LAB — PROCALCITONIN: Procalcitonin: <0.1 ng/mL

## 2024-06-03 LAB — LACTIC ACID, PLASMA
Lactic Acid, Venous: 0.8 mmol/L (ref 0.5–1.9)
Lactic Acid, Venous: 2.4 mmol/L (ref 0.5–1.9)

## 2024-06-03 MED ORDER — VANCOMYCIN HCL IN DEXTROSE 1-5 GM/200ML-% IV SOLN
1000.0000 mg | Freq: Once | INTRAVENOUS | Status: AC
  Administered 2024-06-03: 1000 mg via INTRAVENOUS
  Filled 2024-06-03: qty 200

## 2024-06-03 MED ORDER — IPRATROPIUM-ALBUTEROL 0.5-2.5 (3) MG/3ML IN SOLN
3.0000 mL | Freq: Four times a day (QID) | RESPIRATORY_TRACT | Status: DC
  Administered 2024-06-03: 3 mL via RESPIRATORY_TRACT
  Filled 2024-06-03: qty 3

## 2024-06-03 MED ORDER — SODIUM CHLORIDE 0.9 % IV SOLN
2.0000 g | Freq: Once | INTRAVENOUS | Status: AC
  Administered 2024-06-03: 2 g via INTRAVENOUS
  Filled 2024-06-03: qty 12.5

## 2024-06-03 MED ORDER — IPRATROPIUM-ALBUTEROL 0.5-2.5 (3) MG/3ML IN SOLN: 3.0000 mL | Freq: Four times a day (QID) | RESPIRATORY_TRACT | Status: DC | PRN

## 2024-06-03 MED ORDER — GUAIFENESIN ER 600 MG PO TB12
1200.0000 mg | ORAL_TABLET | Freq: Two times a day (BID) | ORAL | Status: DC
  Administered 2024-06-03 – 2024-06-09 (×13): 1200 mg via ORAL
  Filled 2024-06-03 (×13): qty 2

## 2024-06-03 MED ORDER — LACTATED RINGERS IV SOLN: INTRAVENOUS | Status: AC

## 2024-06-03 MED ORDER — SODIUM CHLORIDE 0.9 % IV BOLUS
500.0000 mL | Freq: Once | INTRAVENOUS | Status: AC
  Administered 2024-06-03: 500 mL via INTRAVENOUS

## 2024-06-03 MED ORDER — ENOXAPARIN SODIUM 40 MG/0.4ML IJ SOSY
40.0000 mg | PREFILLED_SYRINGE | INTRAMUSCULAR | Status: DC
  Administered 2024-06-03 – 2024-06-08 (×6): 40 mg via SUBCUTANEOUS
  Filled 2024-06-03 (×6): qty 0.4

## 2024-06-03 MED ORDER — SODIUM CHLORIDE 0.9 % IV SOLN
2.0000 g | Freq: Two times a day (BID) | INTRAVENOUS | Status: AC
  Administered 2024-06-03 – 2024-06-07 (×9): 2 g via INTRAVENOUS
  Filled 2024-06-03 (×9): qty 12.5

## 2024-06-03 NOTE — Plan of Care (Signed)

## 2024-06-03 NOTE — Sepsis Progress Note (Signed)
 Sepsis protocol monitored by eLink ?

## 2024-06-03 NOTE — Progress Notes (Signed)
 Physical Therapy Treatment Patient Details Name: [name]. MRN: [id number] DOB: 1931/12/01 Today's Date: 06/03/2024   History of Present Illness Keith Goldfarb. is a 89 y.o. male with recent past medical history of flexor tenosynovitis on the right hand that required surgery on 05/24/2024, discharged ~1 week later and retuned on the same day due to hypertension as well as inability of family to care for him at home.  Family reports they have been unable to get patient out of bed without significant assistance and he has been unable to ambulate since he has been home. Patient did have an episode of high blood pressure at home with systolics in the 240s however patient's systolic is 144 on reassessment during my evaluation.  Patient was supposed to be on Norvasc  however there is question as to whether he took it today but for being discharged  ROS: Patient currently denies any vision changes, tinnitus, difficulty speaking, facial droop, sore throat, chest pain, shortness of breath, abdominal pain, nausea/vomiting/diarrhea, dysuria, or weakness/numbness/paresthesias in any extremity.    PT Comments  Patient transferred to medical unit after fever overnight; admitted for management of LLL infiltrate/hospital-acquired PNA.  Patient remains globally confused, but pleasant and cooperative throughout session.  Eager for mobility efforts.  Significant R lateral lean with all sitting activities, limited insight/ability to self-correct.  Does improve with standing efforts, requiring min assist +1 with standing/gait efforts at all times.  Continuous manual facilitation for L ant/lateral weight shift, but does respond well to manual assist from therapist.  Unsafe to attempt gait efforts without RW and continuous +1 assist; discharge recommendations remain appropriate.     If plan is discharge home, recommend the following: A little help with walking and/or transfers;A little help with  bathing/dressing/bathroom;Assistance with cooking/housework;Direct supervision/assist for medications management;Supervision due to cognitive status;Assist for transportation;Help with stairs or ramp for entrance   Can travel by private vehicle        Equipment Recommendations  Rolling walker (2 wheels)    Recommendations for Other Services       Precautions / Restrictions Precautions Precautions: Fall Restrictions Weight Bearing Restrictions Per Provider Order: No     Mobility  Bed Mobility   Bed Mobility: Supine to Sit     Supine to sit: Mod assist, Max assist     General bed mobility comments: poor motor planning and task sequencing    Transfers Overall transfer level: Needs assistance Equipment used: Rolling walker (2 wheels) Transfers: Sit to/from Stand Sit to Stand: Contact guard assist, Min assist           General transfer comment: assist for lift off, anterior weight translation and R LE/foot positioning with movement transitions; generally impulsive, limited insight into safety needs    Ambulation/Gait Ambulation/Gait assistance: Min assist, +2 safety/equipment Gait Distance (Feet): 50 Feet Assistive device: Rolling walker (2 wheels)         General Gait Details: improved walker position and management this date; however, continuous physical assist for L ant/lateral weight shift (due to excessive R lateral lean) and overall dynamic balance.  Inconsistent R LE step height/length throughout gait cycle   Stairs             Wheelchair Mobility     Tilt Bed    Modified Rankin (Stroke Patients Only)       Balance Overall balance assessment: Needs assistance Sitting-balance support: No upper extremity supported, Feet supported Sitting balance-Leahy Scale: Poor Sitting balance - Comments: R lateral lean,  minimal/no attempts at sponateous correction   Standing balance support: Bilateral upper extremity supported, During functional  activity Standing balance-Leahy Scale: Poor                              Communication    Cognition Arousal: Alert Behavior During Therapy: WFL for tasks assessed/performed   PT - Cognitive impairments: Memory, Awareness                       PT - Cognition Comments: cues for safety and awarenss; poor insight into deficits Following commands: Impaired Following commands impaired: Follows one step commands inconsistently    Cueing Cueing Techniques: Verbal cues  Exercises Other Exercises Other Exercises: Unsupported sitting balance, min/mod cuing for L ant/lateral weight shift and midline orientation.  Wedge under R LE for passive closure of R lateral trunk and passive balance correction. Other Exercises: Sit/stand x5 with RW, min assist +2 for safety; generally impulisve with movement transitions, does require UE support to lift off and stabilize.  Limited lumbar ext/anterior pelvic tilt and anterior weight translation with each movement transition    General Comments        Pertinent Vitals/Pain Pain Assessment Pain Assessment: No/denies pain    Home Living                          Prior Function            PT Goals (current goals can now be found in the care plan section) Acute Rehab PT Goals Patient Stated Goal: Improved balance PT Goal Formulation: With patient/family Time For Goal Achievement: 06/14/24 Potential to Achieve Goals: Fair Progress towards PT goals: Progressing toward goals    Frequency    Min 2X/week      PT Plan      Co-evaluation              AM-PAC PT 6 Clicks Mobility   Outcome Measure  Help needed turning from your back to your side while in a flat bed without using bedrails?: A Lot Help needed moving from lying on your back to sitting on the side of a flat bed without using bedrails?: A Lot Help needed moving to and from a bed to a chair (including a wheelchair)?: A Little Help needed  standing up from a chair using your arms (e.g., wheelchair or bedside chair)?: A Little Help needed to walk in hospital room?: A Little Help needed climbing 3-5 steps with a railing? : A Lot 6 Click Score: 15    End of Session Equipment Utilized During Treatment: Gait belt Activity Tolerance: Patient tolerated treatment well Patient left: in chair;with call bell/phone within reach;with family/visitor present Nurse Communication: Mobility status PT Visit Diagnosis: History of falling (Z91.81);Muscle weakness (generalized) (M62.81);Difficulty in walking, not elsewhere classified (R26.2)     Time: 8466-8395 PT Time Calculation (min) (ACUTE ONLY): 31 min  Charges:    $Gait Training: 8-22 mins $Therapeutic Activity: 8-22 mins PT General Charges $$ ACUTE PT VISIT: 1 Visit                    Ashaki Frosch H. Delores, PT, DPT, NCS 06/03/24, 5:41 PM [phone]

## 2024-06-03 NOTE — ED Provider Notes (Signed)
 Emergency Medicine Observation Re-evaluation Note   BP 139/68   Pulse 80   Temp 100 F (37.8 C) (Axillary)   Resp 18   SpO2 100%    ED Course / MDM   No reported events during my shift at the time of this note.   Pt is awaiting dispo from consultants   Ginnie Shams MD    Shams Ginnie, MD 06/03/24 [phone]

## 2024-06-03 NOTE — ED Notes (Signed)
 Lab at bedside

## 2024-06-03 NOTE — H&P (Addendum)
 History and Physical    [name]. FMW:[id number] DOB: [birth year] DOA: 05/31/2024  PCP: Keith Lame, MD (Confirm with patient/family/NH records and if not entered, this has to be entered at [facility] point of entry) Patient coming from: Home  I have personally briefly reviewed patient's old medical records in [facility]  Chief Complaint: Cough, fever, trouble to walk  HPI: [name]. is a 89 y.o. male with medical history significant of recently diagnosed right hand tenosynovitis, HTN, history of drug-induced autoimmune hemolytic anemia, prostate cancer history of ITP, brought in by family member for evaluation of ambulation difficulties.  Patient was hospitalized last week for right hand tenosynovitis, underwent surgical I&D and discharged home on Augmentin .  4 days ago, patient went home but wife has had difficulty to take care of him and patient has significant deconditioning from hospitalization and has hard time to ambulate himself even with help from his wife.  3 days ago family called EMS and EMS arrived and found the patient blood pressure in 200s and the patient shifted to ED.  Since then patient has been staying in the ED, physical therapy evaluated patient and recommended acute rehab.  Since coming to ED patient has had a dry cough occasionally and patient started develop low-grade fever last night and x-ray showed left lower lobe pneumonia.  Patient denied any chest pains  Review of Systems: As per HPI otherwise 14 point review of systems negative.    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 He has never used smokeless tobacco. He reports current alcohol  use. He reports that he does not use drugs.  Allergies  Allergen Reactions   Cephalexin  Diarrhea    C.diff   Hydrocodone-Acetaminophen  Nausea Only    Vomiting/dizziness    Family History  Problem Relation Age of Onset   Cancer Mother    Multiple sclerosis Father      Prior to Admission medications   Medication Sig Start Date End Date Taking? Authorizing Provider  acetaminophen  (TYLENOL ) 500 MG tablet Take 500 mg by mouth every 6 (six) hours as needed for mild pain. Reported on 01/24/2016   Yes [provider]  amLODipine  (NORVASC ) 5 MG tablet Take 1 tablet (5 mg total) by mouth daily. 06/01/24  Yes Alexander, Natalie, DO  amoxicillin -clavulanate (AUGMENTIN ) 875-125 MG tablet Take 1 tablet by mouth every 12 (twelve) hours for 7 doses. Starting this evening 05/31/24 then twice daily starting 06/01/24 05/31/24 06/04/24 Yes Alexander, Natalie, DO  Cholecalciferol  (VITAMIN D3) 250 MCG (10000 UT) capsule Take 10,000 Units by mouth daily.   Yes [provider]  citalopram  (CELEXA ) 20 MG tablet Take 20 mg by mouth daily. 04/01/24  Yes  [provider]  dorzolamide  (TRUSOPT ) 2 % ophthalmic solution Place 1 drop into the right eye 2 (two) times daily.   Yes [provider]  latanoprost  (XALATAN ) 0.005 % ophthalmic solution Place 1 drop into the right eye at bedtime.   Yes [provider]  lisinopril  (ZESTRIL ) 20 MG tablet Take 20 mg by mouth daily. 02/19/24 02/18/25 Yes [provider]  mirabegron  ER (MYRBETRIQ ) 25 MG TB24 tablet Take 25 mg by mouth daily. 05/06/24  Yes [provider]  omeprazole   (PRILOSEC) 40 MG capsule TAKE 1 CAPSULE BY MOUTH DAILY USUALLY 30 MINUTES BEFORE BREAKFAST 09/28/17  Yes Brahmanday, Govinda R, MD  pravastatin  (PRAVACHOL ) 20 MG tablet Take 20 mg by mouth at bedtime.    Yes [provider]  propranolol  (INDERAL ) 40 MG tablet Take 1 tablet by mouth 2 (two) times daily. 03/14/24  Yes [provider]  eltrombopag  (PROMACTA ) 25 MG tablet TAKE 1 TABLET (25 MG TOTAL) BY MOUTH EVERY OTHER DAY. TAKE ON AN EMPTY STOMACH, 1 HOUR BEFORE A MEAL OR 2 HOURS AFTER. Patient not taking: Reported on 05/31/2024 10/08/23   Brahmanday, Govinda R, MD  mycophenolate  (CELLCEPT ) 500 MG tablet Take 1 tablet (500 mg total) by mouth daily. Patient not taking: Reported on 05/31/2024 12/13/23   [name], MD    Physical Exam: Vitals:   06/02/24 2314 06/03/24 0741 06/03/24 0844 06/03/24 1008  BP:   133/81 128/69  Pulse:   77 81  Resp:   19   Temp: 100 F (37.8 C) 98.3 F (36.8 C) 98.3 F (36.8 C)   TempSrc: Axillary Oral    SpO2:   98%     Constitutional: NAD, calm, comfortable Vitals:   06/02/24 2314 06/03/24 0741 06/03/24 0844 06/03/24 1008  BP:   133/81 128/69  Pulse:   77 81  Resp:   19   Temp: 100 F (37.8 C) 98.3 F (36.8 C) 98.3 F (36.8 C)   TempSrc: Axillary Oral    SpO2:   98%    Eyes: PERRL, lids and conjunctivae normal ENMT: Mucous membranes are moist. Posterior pharynx clear of any exudate or lesions.Normal dentition.  Neck: normal, supple, no masses, no thyromegaly Respiratory: clear to auscultation bilaterally, no wheezing, coarse crackles on left lower field, increasing respiratory effort. No accessory muscle use.  Cardiovascular: Regular rate and rhythm, no murmurs / rubs / gallops. No extremity edema. 2+ pedal pulses. No carotid bruits.  Abdomen: no tenderness, no masses palpated. No hepatosplenomegaly. Bowel sounds positive.  Musculoskeletal: no clubbing / cyanosis. No joint deformity upper and lower extremities. Good ROM, no  contractures. Normal muscle tone.  Skin: no rashes, lesions, ulcers. No induration Neurologic: CN 2-12 grossly intact. Sensation intact, DTR normal. Strength 5/5 in all 4.  Psychiatric: Normal judgment and insight. Alert and oriented x 3. Normal mood.     Labs on Admission: I have personally reviewed following labs and imaging studies  CBC: Recent Labs  Lab 05/28/24 0352 05/29/24 0325 05/30/24 0541 06/01/24 0121 06/03/24 0909  WBC 14.2* 14.7* 11.9* 10.9* 11.9*  NEUTROABS 11.7* 11.8*  --  7.8*  --   HGB 12.3* 12.0* 11.8* 12.0* 11.9*  HCT 35.6* 34.8* 33.8* 34.3* 34.3*  MCV 98.9 97.8 96.3 97.4 97.7  PLT 258 273 314 331 394   Basic Metabolic Panel: Recent Labs  Lab 05/28/24 0352 05/29/24 0325 05/30/24 0541 06/01/24 0121 06/03/24 0909  NA 129* 130* 130* 131* 132*  K 3.5 3.7 3.8 3.5 3.7  CL 97* 100 97* 101 101  CO2 24 22 23 22 23   GLUCOSE 105* 106* 97 107* 100*  BUN 17 18 20 19 15   CREATININE 0.80 0.75 0.66 0.67 0.58*  CALCIUM 8.4* 8.5* 8.4* 8.3* 8.6*   GFR: Estimated Creatinine Clearance: 58.5 mL/min (A) (by C-G formula based on SCr of 0.58 mg/dL (L)). Liver Function Tests: Recent Labs  Lab 06/01/24 0121  AST 27  ALT 24  ALKPHOS 95  BILITOT 0.8  PROT 6.9  ALBUMIN 2.7*   No results for input(s): LIPASE, AMYLASE in the last 168 hours. No results for input(s): AMMONIA in the last 168 hours. Coagulation Profile: No results for input(s): INR, PROTIME in the last 168 hours. Cardiac Enzymes: No results for input(s): CKTOTAL, CKMB, CKMBINDEX, TROPONINI in the last 168 hours. BNP (last 3 results) No results for input(s): PROBNP in the last 8760 hours. HbA1C: No results for input(s): HGBA1C in the last 72 hours. CBG: No results for input(s): GLUCAP in the last 168 hours. Lipid Profile: No results for input(s): CHOL, HDL, LDLCALC, TRIG, CHOLHDL, LDLDIRECT in the last 72 hours. Thyroid  Function Tests: No results for input(s):  TSH, T4TOTAL, FREET4, T3FREE, THYROIDAB in the last 72 hours. Anemia Panel: No results for input(s): VITAMINB12, FOLATE, FERRITIN, TIBC, IRON, RETICCTPCT in the last 72 hours. Urine analysis:    Component Value Date/Time   COLORURINE YELLOW (A) 06/03/2024 0840   APPEARANCEUR CLEAR (A) 06/03/2024 0840   APPEARANCEUR Clear 12/08/2014 1450   LABSPEC 1.016 06/03/2024 0840   LABSPEC 1.043 12/08/2014 1450   PHURINE 5.0 06/03/2024 0840   GLUCOSEU NEGATIVE 06/03/2024 0840   GLUCOSEU Negative 12/08/2014 1450   HGBUR NEGATIVE 06/03/2024 0840   BILIRUBINUR NEGATIVE 06/03/2024 0840   BILIRUBINUR Negative 12/08/2014 1450   KETONESUR NEGATIVE 06/03/2024 0840   PROTEINUR NEGATIVE 06/03/2024 0840   NITRITE NEGATIVE 06/03/2024 0840   LEUKOCYTESUR NEGATIVE 06/03/2024 0840   LEUKOCYTESUR Negative 12/08/2014 1450    Radiological Exams on Admission: DG Chest Portable 1 View Result Date: 06/03/2024 CLINICAL DATA:  Fever and hypertension EXAM: PORTABLE CHEST 1 VIEW COMPARISON:  05/29/2024 FINDINGS: Apical lordotic portable AP radiograph. Midline trachea. Cardiomegaly accentuated by AP portable technique. Atherosclerosis in the transverse aorta. Possible trace left pleural fluid. No pneumothorax. Low lung volumes. Interstitial coarsening is nonspecific and accentuated by AP portable technique. Increased left base airspace disease. Minimal right base subsegmental atelectasis or scar. IMPRESSION: Worsened left lower lobe airspace disease, favoring infection or aspiration. Aortic Atherosclerosis (ICD10-I70.0). Electronically Signed   By: Rockey Kilts M.D.   On: 06/03/2024 09:09    EKG: Ordered  Assessment/Plan Principal Problem:   CAP (community acquired pneumonia) Active Problems:   Lobar pneumonia (HCC)  (please populate well all problems here in Problem List. (For example, if patient is on BP meds at home and you resume or decide to hold them, it is a problem that needs to be her.  Same for CAD, COPD, HLD and so on)  LLL PNA, HCAP, bacterial - Escalate coverage to cefepime , check MRSA screening, hold off vancomycin  for now - Incentive spirometry and flutter valve - DuoNebs - Robitussin - Denied any history of dysphagia, no cough or choking after eat or drink, low suspicion for aspiration episode.  Acute on chronic ambulation impairment Deconditioning - Continue PT evaluation, SLP yesterday, PT recommended acute rehab.  Case management consulted.  Recent tenosynovitis of right fingers and right hand - Status post I&D.  Review of intra operation wound culture culture records showed no  growth in 5 days - Hold off Augmentin  while patient on cefepime  to treat pneumonia  Uncontrolled hypertension - Improved, continue Zestril  and propranolol   History of ITP History of autoimmune hemolytic anemia - H&H and platelet count stable, no acute concern  HLD - Continue Pravachol   DVT prophylaxis: Lovenox  Code Status: Full code Family Communication: Wife at bedside Disposition Plan: Expect less than 2 midnight hospital stay Consults called: None Admission status: Telemetry observation   Cort ONEIDA Mana MD [facility] Pager [phone]  06/03/2024, 10:48 AM

## 2024-06-03 NOTE — ED Provider Notes (Signed)
 Care assumed of patient from outgoing provider.  See their note for initial history, exam and plan.  Patient became febrile overnight with a temperature of 100.3.  Was given antipyretics with Tylenol .  Blood pressure remained stable at 133/81.  Will obtain repeat lab work this morning including a repeat urine and chest x-ray.  Patient was in the hospital with multiple IV antibiotics for his flexor tenosynovitis.  Repeat chest x-ray with concern for left lower lobe infiltrate.  Patient did have symptoms of coughing and pneumonia on exam.  Recent chest x-ray in the hospital showed some atelectasis.  Given that the patient received multiple days of antibiotics in the hospital will cover the patient for hospital-acquired pneumonia.  Added on blood cultures and lactic acid.  Discussed with the hospitalist plan for admission for pneumonia.      Suzanne Kirsch, MD 06/03/24 [phone]

## 2024-06-04 DIAGNOSIS — F419 Anxiety disorder, unspecified: Secondary | ICD-10-CM | POA: Diagnosis not present

## 2024-06-04 DIAGNOSIS — D591 Autoimmune hemolytic anemia, unspecified: Secondary | ICD-10-CM | POA: Diagnosis not present

## 2024-06-04 DIAGNOSIS — I160 Hypertensive urgency: Secondary | ICD-10-CM | POA: Diagnosis not present

## 2024-06-04 DIAGNOSIS — Y95 Nosocomial condition: Secondary | ICD-10-CM | POA: Diagnosis present

## 2024-06-04 DIAGNOSIS — Z79899 Other long term (current) drug therapy: Secondary | ICD-10-CM | POA: Diagnosis not present

## 2024-06-04 DIAGNOSIS — E876 Hypokalemia: Secondary | ICD-10-CM | POA: Diagnosis not present

## 2024-06-04 DIAGNOSIS — R011 Cardiac murmur, unspecified: Secondary | ICD-10-CM | POA: Diagnosis not present

## 2024-06-04 DIAGNOSIS — G250 Essential tremor: Secondary | ICD-10-CM | POA: Diagnosis not present

## 2024-06-04 DIAGNOSIS — Z886 Allergy status to analgesic agent status: Secondary | ICD-10-CM | POA: Diagnosis not present

## 2024-06-04 DIAGNOSIS — J9811 Atelectasis: Secondary | ICD-10-CM | POA: Diagnosis not present

## 2024-06-04 DIAGNOSIS — M6281 Muscle weakness (generalized): Secondary | ICD-10-CM | POA: Diagnosis not present

## 2024-06-04 DIAGNOSIS — Z820 Family history of epilepsy and other diseases of the nervous system: Secondary | ICD-10-CM | POA: Diagnosis not present

## 2024-06-04 DIAGNOSIS — E785 Hyperlipidemia, unspecified: Secondary | ICD-10-CM | POA: Diagnosis not present

## 2024-06-04 DIAGNOSIS — Z885 Allergy status to narcotic agent status: Secondary | ICD-10-CM | POA: Diagnosis not present

## 2024-06-04 DIAGNOSIS — D75839 Thrombocytosis, unspecified: Secondary | ICD-10-CM | POA: Diagnosis not present

## 2024-06-04 DIAGNOSIS — E7800 Pure hypercholesterolemia, unspecified: Secondary | ICD-10-CM | POA: Diagnosis not present

## 2024-06-04 DIAGNOSIS — E871 Hypo-osmolality and hyponatremia: Secondary | ICD-10-CM | POA: Diagnosis not present

## 2024-06-04 DIAGNOSIS — J189 Pneumonia, unspecified organism: Secondary | ICD-10-CM | POA: Diagnosis not present

## 2024-06-04 DIAGNOSIS — Z881 Allergy status to other antibiotic agents status: Secondary | ICD-10-CM | POA: Diagnosis not present

## 2024-06-04 DIAGNOSIS — R413 Other amnesia: Secondary | ICD-10-CM | POA: Diagnosis not present

## 2024-06-04 DIAGNOSIS — Z9889 Other specified postprocedural states: Secondary | ICD-10-CM | POA: Diagnosis not present

## 2024-06-04 DIAGNOSIS — I10 Essential (primary) hypertension: Secondary | ICD-10-CM | POA: Diagnosis not present

## 2024-06-04 DIAGNOSIS — Z862 Personal history of diseases of the blood and blood-forming organs and certain disorders involving the immune mechanism: Secondary | ICD-10-CM | POA: Diagnosis not present

## 2024-06-04 DIAGNOSIS — Z1152 Encounter for screening for COVID-19: Secondary | ICD-10-CM | POA: Diagnosis not present

## 2024-06-04 DIAGNOSIS — Z970 Presence of artificial eye: Secondary | ICD-10-CM | POA: Diagnosis not present

## 2024-06-04 DIAGNOSIS — R2689 Other abnormalities of gait and mobility: Secondary | ICD-10-CM | POA: Diagnosis not present

## 2024-06-04 DIAGNOSIS — D693 Immune thrombocytopenic purpura: Secondary | ICD-10-CM | POA: Diagnosis not present

## 2024-06-04 DIAGNOSIS — Z741 Need for assistance with personal care: Secondary | ICD-10-CM | POA: Diagnosis not present

## 2024-06-04 DIAGNOSIS — Z9181 History of falling: Secondary | ICD-10-CM | POA: Diagnosis not present

## 2024-06-04 DIAGNOSIS — I5189 Other ill-defined heart diseases: Secondary | ICD-10-CM | POA: Diagnosis not present

## 2024-06-04 DIAGNOSIS — J159 Unspecified bacterial pneumonia: Secondary | ICD-10-CM | POA: Diagnosis not present

## 2024-06-04 DIAGNOSIS — R3915 Urgency of urination: Secondary | ICD-10-CM | POA: Diagnosis not present

## 2024-06-04 DIAGNOSIS — R4189 Other symptoms and signs involving cognitive functions and awareness: Secondary | ICD-10-CM | POA: Diagnosis not present

## 2024-06-04 DIAGNOSIS — Z8546 Personal history of malignant neoplasm of prostate: Secondary | ICD-10-CM | POA: Diagnosis not present

## 2024-06-04 DIAGNOSIS — M65941 Unspecified synovitis and tenosynovitis, right hand: Secondary | ICD-10-CM | POA: Diagnosis not present

## 2024-06-04 DIAGNOSIS — H409 Unspecified glaucoma: Secondary | ICD-10-CM | POA: Diagnosis not present

## 2024-06-04 DIAGNOSIS — K219 Gastro-esophageal reflux disease without esophagitis: Secondary | ICD-10-CM | POA: Diagnosis not present

## 2024-06-04 NOTE — Care Management Obs Status (Signed)
 MEDICARE OBSERVATION STATUS NOTIFICATION   Patient Details  Name: [name]. MRN: [id number] Date of Birth: Oct 06, 1932   Medicare Observation Status Notification Given:  Yes    Caley Ciaramitaro W, CMA 06/04/2024, 11:22 AM

## 2024-06-04 NOTE — Progress Notes (Signed)
 Physical Therapy Treatment Patient Details Name: [name]. MRN: [id number] DOB: October 03, 1932 Today's Date: 06/04/2024   History of Present Illness [name]. is a 89 y.o. male with recent past medical history of flexor tenosynovitis on the right hand that required surgery on 05/24/2024, discharged ~1 week later and retuned on the same day due to hypertension as well as inability of family to care for him at home.  Family reports they have been unable to get patient out of bed without significant assistance and he has been unable to ambulate since he has been home. Patient did have an episode of high blood pressure at home with systolics in the 240s however patient's systolic is 144 on reassessment during my evaluation.  Patient was supposed to be on Norvasc  however there is question as to whether he took it today but for being discharged  ROS: Patient currently denies any vision changes, tinnitus, difficulty speaking, facial droop, sore throat, chest pain, shortness of breath, abdominal pain, nausea/vomiting/diarrhea, dysuria, or weakness/numbness/paresthesias in any extremity.    PT Comments  Patient seated edge of bed upon arrival to session; just completed [facility] transfer/toileting with CNA assist.  Progressive increase in gait distance and overall activity tolerance this date, completing full lap around nursing station with RW, min assist +1.   Demonstrates forward flexed posture with min cuing/assist for walker position; partially reciprocal stepping pattern throughout distance.  Improved midline orientation and dynamic weight shifting this date, but does continue to require RW and +1 at all times for optimal safety.   If plan is discharge home, recommend the following: A little help with walking and/or transfers;A little help with bathing/dressing/bathroom;Assistance with cooking/housework;Direct supervision/assist for medications management;Supervision due to cognitive status;Assist  for transportation;Help with stairs or ramp for entrance   Can travel by private vehicle     Yes  Equipment Recommendations       Recommendations for Other Services       Precautions / Restrictions Precautions Precautions: Fall Recall of Precautions/Restrictions: Impaired Restrictions Weight Bearing Restrictions Per Provider Order: No Other Position/Activity Restrictions: WBAT to R hand per Dr. Tobie     Mobility  Bed Mobility               General bed mobility comments: seated edge of bed upon arrival to session; up in recliner end of session    Transfers Overall transfer level: Needs assistance Equipment used: Rolling walker (2 wheels) Transfers: Sit to/from Stand Sit to Stand: Min assist                Ambulation/Gait Ambulation/Gait assistance: Min assist Gait Distance (Feet): 200 Feet Assistive device: Rolling walker (2 wheels)         General Gait Details: forward flexed posture with min cuing/assist for walker position; partially reciprocal stepping pattern throughout distance.  Improved midline orientation and dynamic weight shifting this date, but does continue to require RW and +1 at all times for optimal safety.   Stairs             Wheelchair Mobility     Tilt Bed    Modified Rankin (Stroke Patients Only)       Balance Overall balance assessment: Needs assistance   Sitting balance-Leahy Scale: Fair Sitting balance - Comments: improved midline in M/L plane today; lists posteriorally with fatigue, divided attention but does correct with cuing from therapist (minimal/no attempts at self-correction)   Standing balance support: Bilateral upper extremity supported, No upper extremity supported  Standing balance-Leahy Scale: Fair                              Hotel manager: Impaired Factors Affecting Communication: Hearing impaired  Cognition Arousal: Alert Behavior During Therapy: WFL  for tasks assessed/performed   PT - Cognitive impairments: Memory, Awareness                       PT - Cognition Comments: cues for safety and awarenss; poor insight into deficits Following commands: Impaired Following commands impaired: Follows one step commands inconsistently    Cueing Cueing Techniques: Verbal cues, Tactile cues, Visual cues  Exercises Other Exercises Other Exercises: Sit/stand and static standing activities, min assist with RW, working to promote awareness of weight shift/LOB with functional activities.  Patient with improved ability to recognize balance disturbances; does attempt to initiate correction with cuing from therapist, but ultimately requires min physical assist from therapist for full recovery.    General Comments        Pertinent Vitals/Pain Pain Assessment Pain Assessment: No/denies pain    Home Living                          Prior Function            PT Goals (current goals can now be found in the care plan section) Acute Rehab PT Goals Patient Stated Goal: Improved balance PT Goal Formulation: With patient/family Time For Goal Achievement: 06/14/24 Potential to Achieve Goals: Fair Progress towards PT goals: Progressing toward goals    Frequency    Min 2X/week      PT Plan      Co-evaluation              AM-PAC PT 6 Clicks Mobility   Outcome Measure  Help needed turning from your back to your side while in a flat bed without using bedrails?: A Lot Help needed moving from lying on your back to sitting on the side of a flat bed without using bedrails?: A Lot Help needed moving to and from a bed to a chair (including a wheelchair)?: A Little Help needed standing up from a chair using your arms (e.g., wheelchair or bedside chair)?: A Little Help needed to walk in hospital room?: A Little Help needed climbing 3-5 steps with a railing? : A Lot 6 Click Score: 15    End of Session Equipment Utilized  During Treatment: Gait belt Activity Tolerance: Patient tolerated treatment well Patient left: in chair;with call bell/phone within reach;with family/visitor present Nurse Communication: Mobility status PT Visit Diagnosis: History of falling (Z91.81);Muscle weakness (generalized) (M62.81);Difficulty in walking, not elsewhere classified (R26.2)     Time: 8561-8494 PT Time Calculation (min) (ACUTE ONLY): 27 min  Charges:    $Gait Training: 8-22 mins $Therapeutic Activity: 8-22 mins PT General Charges $$ ACUTE PT VISIT: 1 Visit                     Joy Haegele H. Delores, PT, DPT, NCS 06/04/24, 3:16 PM [phone]

## 2024-06-04 NOTE — NC FL2 (Signed)
 [place]  MEDICAID FL2 LEVEL OF CARE FORM     IDENTIFICATION  Patient Name: [name]. Birthdate: Feb 07, 1932 Sex: male Admission Date (Current Location): 05/31/2024  [facility] and [place] Number:  Chiropodist and Address:  [facility], [address], [place], [place] [address]      Provider Number: [id number]  Attending Physician Name and Address:  Jens Durand, MD  Relative Name and Phone Number:  Anselm Aumiller, wife, phone: [phone]; Fairy Oneil Ivonne, son, phone:[phone]; Glendia Ivonne, son, phone: [phone]    Current Level of Care: SNF Recommended Level of Care: Skilled Nursing Facility Prior Approval Number:    Date Approved/Denied: 05/27/24 PASRR Number: [id number]  Discharge Plan: SNF    Current Diagnoses: Patient Active Problem List   Diagnosis Date Noted   Lobar pneumonia (HCC) 06/03/2024   CAP (community acquired pneumonia) 06/03/2024   Tenosynovitis of finger 05/25/2024   Tenosynovitis of finger and hand 05/24/2024   Olecranon bursitis, left elbow 09/06/2017   Lower urinary tract symptoms (LUTS) 10/18/2016   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   Nasal bleeding 01/08/2016   Uncontrolled hypertension 01/08/2016   Idiopathic thrombocytopenic purpura (HCC) 09/01/2015   Amaurosis fugax of right eye 08/10/2015   Acute renal failure (HCC) 04/06/2015   History of ITP 03/22/2015   Syncope 12/21/2014   Pure hypercholesterolemia 08/27/2014   Biceps tendon tear 04/09/2014   Cancer of prostate w/low recurrence risk (T1-2a, Gleason<7 & PSA<10) (HCC) 07/30/2012   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   Serous retinal detachment, right eye 06/11/2012    Orientation RESPIRATION BLADDER Height & Weight     Situation, Place, Self, Time  Normal Incontinent Weight:   Height:     BEHAVIORAL SYMPTOMS/MOOD NEUROLOGICAL BOWEL NUTRITION STATUS      Continent  (Please see discharge summary)   AMBULATORY STATUS COMMUNICATION OF NEEDS Skin   Extensive Assist Verbally  (Erythema buttocks, non-tenting)                       Personal Care Assistance Level of Assistance  Bathing, Feeding, Dressing Bathing Assistance: Limited assistance Feeding assistance: Limited assistance Dressing Assistance: Limited assistance     Functional Limitations Info  Sight, Hearing Sight Info: Impaired Hearing Info: Impaired      SPECIAL CARE FACTORS FREQUENCY  PT (By licensed PT), OT (By licensed OT)     PT Frequency: 5 x per week OT Frequency: 5 x per week            Contractures      Additional Factors Info  Code Status, Allergies Code Status Info: Full Code Allergies Info: Cephalexin   No severity specified - Diarrhea Comments  Hydrocodone-acetaminophen   No severity specified - Nausea Only           Current Medications (06/04/2024):  This is the current hospital active medication list Current Facility-Administered Medications  Medication Dose Route Frequency Provider Last Rate Last Admin   acetaminophen  (TYLENOL ) tablet 500 mg  500 mg Oral Q6H PRN Ward, Kristen N, DO   500 mg at 06/03/24 2137   amLODipine  (NORVASC ) tablet 5 mg  5 mg Oral Daily Ward, Kristen N, DO   5 mg at 06/04/24 0909   ceFEPIme  (MAXIPIME ) 2 g in sodium chloride  0.9 % 100 mL IVPB  2 g Intravenous Q12H Laurita Manor T, MD 200 mL/hr at 06/04/24 0923 2 g at 06/04/24 0923   cholecalciferol  (VITAMIN D3) 25 MCG (1000 UNIT) tablet 5,000 Units  5,000 Units Oral Daily Ward, Kristen N, DO   5,000 Units at 06/04/24 0908   citalopram  (CELEXA ) tablet 20 mg  20 mg Oral Daily Ward, Kristen N, DO   20 mg at 06/04/24 0908   dorzolamide  (TRUSOPT ) 2 % ophthalmic solution 1 drop  1 drop Right Eye BID Ward, Kristen N, DO   1 drop at 06/04/24 0909   enoxaparin  (LOVENOX ) injection 40 mg  40 mg Subcutaneous Q24H Laurita Manor T, MD   40 mg at 06/03/24 2131   guaiFENesin  (MUCINEX ) 12 hr tablet 1,200 mg  1,200 mg Oral BID Laurita Manor  T, MD   1,200 mg at 06/04/24 0908   ipratropium-albuterol  (DUONEB) 0.5-2.5 (3) MG/3ML nebulizer solution 3 mL  3 mL Nebulization Q6H PRN Laurita Manor T, MD       latanoprost  (XALATAN ) 0.005 % ophthalmic solution 1 drop  1 drop Right Eye QHS Ward, Kristen N, DO   1 drop at 06/03/24 2134   mirabegron  ER (MYRBETRIQ ) tablet 25 mg  25 mg Oral Daily Ward, Kristen N, DO   25 mg at 06/04/24 9090   pantoprazole  (PROTONIX ) EC tablet 40 mg  40 mg Oral Daily Ward, Kristen N, DO   40 mg at 06/04/24 9091   pravastatin  (PRAVACHOL ) tablet 20 mg  20 mg Oral QHS Ward, Kristen N, DO   20 mg at 06/03/24 2123   propranolol  (INDERAL ) tablet 40 mg  40 mg Oral BID Ward, Kristen N, DO   40 mg at 06/04/24 9091   QUEtiapine  (SEROQUEL ) tablet 25 mg  25 mg Oral QHS Bradler, Evan K, MD   25 mg at 06/03/24 2123     Discharge Medications: Please see discharge summary for a list of discharge medications.  Relevant Imaging Results:  Relevant Lab Results:   Additional Information SSN:[id number]  Elouise LULLA Capri, RN

## 2024-06-04 NOTE — Progress Notes (Addendum)
 Progress Note    [name].  FMW:[id number] DOB: [birth year]  DOA: 05/31/2024 PCP: [name], MD      Brief Narrative:    Medical records reviewed and are as summarized below:  [name]. is a 89 y.o. male  with medical history significant of recently diagnosed right hand tenosynovitis, HTN, history of drug-induced autoimmune hemolytic anemia, prostate cancer history of ITP, brought in by family member for evaluation of ambulation difficulties.  He was recently from 05/24/2024 through 05/31/2024 after hospitalization for tenosynovitis of right fifth finger, s/p I&D and discharged home on Augmentin .  His wife states that patient has been very weak and has become increasingly difficult to take care of him at home (since discharge from the hospital).  Reportedly, his blood pressure at home was in the 200s.  BP with EMS was 199/102.  Patient was brought to the ED on 05/31/2024 because of severe hypertension.  Patient had been boarding in the ED.  He was evaluated by a physical therapist who recommended discharge to acute rehab.  While in the ED, he developed a dry cough and low-grade fever on 06/02/2024.  Chest x-ray showed left lower lobe pneumonia.  Hospitalist team was consulted for admission.      Assessment/Plan:   Principal Problem:   CAP (community acquired pneumonia) Active Problems:   Lobar pneumonia (HCC)    Left lower lobe pneumonia: Continue IV ceftriaxone and azithromycin. No growth on blood cultures thus far.  Respiratory panel was negative.  SARS-CoV-2, RSV and influenza A and B were negative.   General weakness, ambulatory dysfunction: PT recommended acute rehab.  Follow-up with TOC to assist with disposition   Recent tenosynovitis of right fingers and right hand: Patient had been discharged on Augmentin  and recent discharge from the hospital.  Augmentin  has been held since he is on IV cefepime .   Hypertensive urgency: BP improved.   Continue antihypertensives.   Chronic hyponatremia: Asymptomatic   Comorbidities include history of ITP, autoimmune hemolytic anemia, hyperlipidemia   Diet Order             Diet Heart Fluid consistency: Thin  Diet effective now                            Consultants: None  Procedures: None    Medications:    amLODipine   5 mg Oral Daily   cholecalciferol   5,000 Units Oral Daily   citalopram   20 mg Oral Daily   dorzolamide   1 drop Right Eye BID   enoxaparin  (LOVENOX ) injection  40 mg Subcutaneous Q24H   guaiFENesin   1,200 mg Oral BID   latanoprost   1 drop Right Eye QHS   mirabegron  ER  25 mg Oral Daily   pantoprazole   40 mg Oral Daily   pravastatin   20 mg Oral QHS   propranolol   40 mg Oral BID   QUEtiapine   25 mg Oral QHS   Continuous Infusions:  ceFEPime  (MAXIPIME ) IV 2 g (06/04/24 0923)     Anti-infectives (From admission, onward)    Start     Dose/Rate Route Frequency Ordered Stop   06/03/24 2200  ceFEPIme  (MAXIPIME ) 2 g in sodium chloride  0.9 % 100 mL IVPB        2 g 200 mL/hr over 30 Minutes Intravenous Every 12 hours 06/03/24 1054     06/03/24 0945  vancomycin  (VANCOCIN ) IVPB 1000 mg/200 mL premix  1,000 mg 200 mL/hr over 60 Minutes Intravenous  Once 06/03/24 9062 06/03/24 1216   06/03/24 0945  ceFEPIme  (MAXIPIME ) 2 g in sodium chloride  0.9 % 100 mL IVPB        2 g 200 mL/hr over 30 Minutes Intravenous  Once 06/03/24 0937 06/03/24 1012   05/31/24 2345  amoxicillin -clavulanate (AUGMENTIN ) 875-125 MG per tablet 1 tablet  Status:  Discontinued        1 tablet Oral Every 12 hours 05/31/24 2337 06/03/24 1054              Family Communication/Anticipated D/C date and plan/Code Status   DVT prophylaxis: enoxaparin  (LOVENOX ) injection 40 mg Start: 06/03/24 2200     Code Status: Full Code  Family Communication: Plan discussed with his wife at the bedside Disposition Plan: Plan to discharge to SNF   Status is:  Inpatient Remains inpatient appropriate because: Pneumonia, general weakness         Subjective:   Interval events noted.  He complains of cough.  No shortness of breath or chest pain.  He feels better.  His wife was at the bedside.  His wife said patient does not have dementia although he may be forgetful at times.  Objective:    Vitals:   06/03/24 1955 06/04/24 0341 06/04/24 0738 06/04/24 1617  BP: 137/69 (!) 143/72 (!) 147/84 125/66  Pulse: 83 78 80 68  Resp: 18 14 16 16   Temp: 99.3 F (37.4 C) 98.4 F (36.9 C) 99 F (37.2 C) 98.2 F (36.8 C)  TempSrc: Oral Oral Oral Oral  SpO2: 98% 96% 93% 98%   No data found.   Intake/Output Summary (Last 24 hours) at 06/04/2024 1652 Last data filed at 06/04/2024 1437 Gross per 24 hour  Intake 120 ml  Output 1800 ml  Net -1680 ml   There were no vitals filed for this visit.  Exam:  GEN: NAD SKIN: Warm and dry EYES: No pallor or icterus ENT: MMM CV: RRR PULM: CTA B ABD: soft, ND, NT, +BS CNS: AAO x person, place and situation, non focal EXT: No edema or tenderness.  Dressing on right hand surgical wound is clean, dry and intact        Data Reviewed:   I have personally reviewed following labs and imaging studies:  Labs: Labs show the following:   Basic Metabolic Panel: Recent Labs  Lab 05/29/24 0325 05/30/24 0541 06/01/24 0121 06/03/24 0909  NA 130* 130* 131* 132*  K 3.7 3.8 3.5 3.7  CL 100 97* 101 101  CO2 22 23 22 23   GLUCOSE 106* 97 107* 100*  BUN 18 20 19 15   CREATININE 0.75 0.66 0.67 0.58*  CALCIUM 8.5* 8.4* 8.3* 8.6*   GFR Estimated Creatinine Clearance: 58.5 mL/min (A) (by C-G formula based on SCr of 0.58 mg/dL (L)). Liver Function Tests: Recent Labs  Lab 06/01/24 0121  AST 27  ALT 24  ALKPHOS 95  BILITOT 0.8  PROT 6.9  ALBUMIN 2.7*   No results for input(s): LIPASE, AMYLASE in the last 168 hours. No results for input(s): AMMONIA in the last 168 hours. Coagulation  profile No results for input(s): INR, PROTIME in the last 168 hours.  CBC: Recent Labs  Lab 05/29/24 0325 05/30/24 0541 06/01/24 0121 06/03/24 0909  WBC 14.7* 11.9* 10.9* 11.9*  NEUTROABS 11.8*  --  7.8*  --   HGB 12.0* 11.8* 12.0* 11.9*  HCT 34.8* 33.8* 34.3* 34.3*  MCV 97.8 96.3 97.4 97.7  PLT  273 314 331 394   Cardiac Enzymes: No results for input(s): CKTOTAL, CKMB, CKMBINDEX, TROPONINI in the last 168 hours. BNP (last 3 results) No results for input(s): PROBNP in the last 8760 hours. CBG: No results for input(s): GLUCAP in the last 168 hours. D-Dimer: No results for input(s): DDIMER in the last 72 hours. Hgb A1c: No results for input(s): HGBA1C in the last 72 hours. Lipid Profile: No results for input(s): CHOL, HDL, LDLCALC, TRIG, CHOLHDL, LDLDIRECT in the last 72 hours. Thyroid  function studies: No results for input(s): TSH, T4TOTAL, T3FREE, THYROIDAB in the last 72 hours.  Invalid input(s): FREET3 Anemia work up: No results for input(s): VITAMINB12, FOLATE, FERRITIN, TIBC, IRON, RETICCTPCT in the last 72 hours. Sepsis Labs: Recent Labs  Lab 05/29/24 0325 05/30/24 0541 06/01/24 0121 06/03/24 0909 06/03/24 0937 06/03/24 1505  PROCALCITON  --   --   --  <0.10  --   --   WBC 14.7* 11.9* 10.9* 11.9*  --   --   LATICACIDVEN  --   --   --   --  0.8 2.4*    Microbiology Recent Results (from the past 240 hours)  Resp panel by RT-PCR (RSV, Flu A&B, Covid) Nasopharyngeal Swab     Status: None   Collection Time: 06/03/24  9:37 AM   Specimen: Nasopharyngeal Swab; Nasal Swab  Result Value Ref Range Status   SARS Coronavirus 2 by RT PCR NEGATIVE NEGATIVE Final    Comment: (NOTE) SARS-CoV-2 target nucleic acids are NOT DETECTED.  The SARS-CoV-2 RNA is generally detectable in upper respiratory specimens during the acute phase of infection. The lowest concentration of SARS-CoV-2 viral copies this assay can detect  is 138 copies/mL. A negative result does not preclude SARS-Cov-2 infection and should not be used as the sole basis for treatment or other patient management decisions. A negative result may occur with  improper specimen collection/handling, submission of specimen other than nasopharyngeal swab, presence of viral mutation(s) within the areas targeted by this assay, and inadequate number of viral copies(<138 copies/mL). A negative result must be combined with clinical observations, patient history, and epidemiological information. The expected result is Negative.  Fact Sheet for Patients:  BloggerCourse.com  Fact Sheet for Healthcare Providers:  SeriousBroker.it  This test is no t yet approved or cleared by the [place]  FDA and  has been authorized for detection and/or diagnosis of SARS-CoV-2 by FDA under an Emergency Use Authorization (EUA). This EUA will remain  in effect (meaning this test can be used) for the duration of the COVID-19 declaration under Section 564(b)(1) of the Act, 21 U.S.C.section 360bbb-3(b)(1), unless the authorization is terminated  or revoked sooner.       Influenza A by PCR NEGATIVE NEGATIVE Final   Influenza B by PCR NEGATIVE NEGATIVE Final    Comment: (NOTE) The Xpert Xpress SARS-CoV-2/FLU/RSV plus assay is intended as an aid in the diagnosis of influenza from Nasopharyngeal swab specimens and should not be used as a sole basis for treatment. Nasal washings and aspirates are unacceptable for Xpert Xpress SARS-CoV-2/FLU/RSV testing.  Fact Sheet for Patients: BloggerCourse.com  Fact Sheet for Healthcare Providers: SeriousBroker.it  This test is not yet approved or cleared by the [place]  FDA and has been authorized for detection and/or diagnosis of SARS-CoV-2 by FDA under an Emergency Use Authorization (EUA). This EUA will remain in effect  (meaning this test can be used) for the duration of the COVID-19 declaration under Section 564(b)(1) of the Act, 21 U.S.C. section  360bbb-3(b)(1), unless the authorization is terminated or revoked.     Resp Syncytial Virus by PCR NEGATIVE NEGATIVE Final    Comment: (NOTE) Fact Sheet for Patients: BloggerCourse.com  Fact Sheet for Healthcare Providers: SeriousBroker.it  This test is not yet approved or cleared by the [place]  FDA and has been authorized for detection and/or diagnosis of SARS-CoV-2 by FDA under an Emergency Use Authorization (EUA). This EUA will remain in effect (meaning this test can be used) for the duration of the COVID-19 declaration under Section 564(b)(1) of the Act, 21 U.S.C. section 360bbb-3(b)(1), unless the authorization is terminated or revoked.  Performed at [facility], [address] [place], [place] [address]   Respiratory (~20 pathogens) panel by PCR     Status: None   Collection Time: 06/03/24  9:37 AM   Specimen: Nasopharyngeal Swab; Respiratory  Result Value Ref Range Status   Adenovirus NOT DETECTED NOT DETECTED Final   Coronavirus 229E NOT DETECTED NOT DETECTED Final    Comment: (NOTE) The Coronavirus on the Respiratory Panel, DOES NOT test for the novel  Coronavirus (2019 nCoV)    Coronavirus HKU1 NOT DETECTED NOT DETECTED Final   Coronavirus NL63 NOT DETECTED NOT DETECTED Final   Coronavirus OC43 NOT DETECTED NOT DETECTED Final   Metapneumovirus NOT DETECTED NOT DETECTED Final   Rhinovirus / Enterovirus NOT DETECTED NOT DETECTED Final   Influenza A NOT DETECTED NOT DETECTED Final   Influenza B NOT DETECTED NOT DETECTED Final   Parainfluenza Virus 1 NOT DETECTED NOT DETECTED Final   Parainfluenza Virus 2 NOT DETECTED NOT DETECTED Final   Parainfluenza Virus 3 NOT DETECTED NOT DETECTED Final   Parainfluenza Virus 4 NOT DETECTED NOT DETECTED Final   Respiratory Syncytial  Virus NOT DETECTED NOT DETECTED Final   Bordetella pertussis NOT DETECTED NOT DETECTED Final   Bordetella Parapertussis NOT DETECTED NOT DETECTED Final   Chlamydophila pneumoniae NOT DETECTED NOT DETECTED Final   Mycoplasma pneumoniae NOT DETECTED NOT DETECTED Final    Comment: Performed at [facility], [address] [place], [place] [address]  Blood Culture (routine x 2)     Status: None (Preliminary result)   Collection Time: 06/03/24 10:45 AM   Specimen: BLOOD  Result Value Ref Range Status   Specimen Description BLOOD LEFT ANTECUBITAL  Final   Special Requests   Final    BOTTLES DRAWN AEROBIC AND ANAEROBIC Blood Culture adequate volume   Culture   Final    NO GROWTH < 24 HOURS Performed at [facility], [address] [place], [place] [address]    Report Status PENDING  Incomplete  Blood Culture (routine x 2)     Status: None (Preliminary result)   Collection Time: 06/03/24 10:45 AM   Specimen: BLOOD  Result Value Ref Range Status   Specimen Description BLOOD LEFT ARM  Final   Special Requests   Final    BOTTLES DRAWN AEROBIC AND ANAEROBIC Blood Culture results may not be optimal due to an inadequate volume of blood received in culture bottles   Culture   Final    NO GROWTH < 24 HOURS Performed at [facility], [address], [place], [place] [address]    Report Status PENDING  Incomplete  MRSA Next Gen by PCR, Nasal     Status: None   Collection Time: 06/03/24  1:55 PM  Result Value Ref Range Status   MRSA by PCR Next Gen NOT DETECTED NOT DETECTED Final  Comment: (NOTE) The GeneXpert MRSA Assay (FDA approved for NASAL specimens only), is one component of a comprehensive MRSA colonization surveillance program. It is not intended to diagnose MRSA infection nor to guide or monitor treatment for MRSA infections. Test performance is not FDA approved in patients less than 109 years old. Performed at [facility], [address]  [place], [place] [address]     Procedures and diagnostic studies:  DG Chest Portable 1 View Result Date: 06/03/2024 CLINICAL DATA:  Fever and hypertension EXAM: PORTABLE CHEST 1 VIEW COMPARISON:  05/29/2024 FINDINGS: Apical lordotic portable AP radiograph. Midline trachea. Cardiomegaly accentuated by AP portable technique. Atherosclerosis in the transverse aorta. Possible trace left pleural fluid. No pneumothorax. Low lung volumes. Interstitial coarsening is nonspecific and accentuated by AP portable technique. Increased left base airspace disease. Minimal right base subsegmental atelectasis or scar. IMPRESSION: Worsened left lower lobe airspace disease, favoring infection or aspiration. Aortic Atherosclerosis (ICD10-I70.0). Electronically Signed   By: Rockey Kilts M.D.   On: 06/03/2024 09:09               LOS: 0 days   Chellsea Beckers  [facility]   Pager on [email or web address]. If 7PM-7AM, please contact night-coverage at [email or web address]     06/04/2024, 4:52 PM

## 2024-06-04 NOTE — Progress Notes (Signed)
 Occupational Therapy Treatment Patient Details Name: [name]. MRN: [id number] DOB: 1932/01/30 Today's Date: 06/04/2024   History of present illness [name]. is a 89 y.o. male with recent past medical history of flexor tenosynovitis on the right hand that required surgery on 05/24/2024, discharged ~1 week later and retuned on the same day due to hypertension as well as inability of family to care for him at home.  Family reports they have been unable to get patient out of bed without significant assistance and he has been unable to ambulate since he has been home. Patient did have an episode of high blood pressure at home with systolics in the 240s however patient's systolic is 144 on reassessment during my evaluation.  Patient was supposed to be on Norvasc  however there is question as to whether he took it today but for being discharged  ROS: Patient currently denies any vision changes, tinnitus, difficulty speaking, facial droop, sore throat, chest pain, shortness of breath, abdominal pain, nausea/vomiting/diarrhea, dysuria, or weakness/numbness/paresthesias in any extremity.   OT comments  Pt seen for OT treatment on this date. Upon arrival to room pt in bed, agreeable to tx. Pt requires Max A for bed mobility for supine to sit EOB.  At EOB, patient with decreased upright sitting balance with posterior leaning noted.  Tactile cuing for weight shifting forward to correct.  At EOB, patient completed grooming and oral care tasks utilizing RW with cuing for weight shifting to ensure upright sitting balance and SBA required overall following set-up related to seated grooming tasks.  Patient with increased lethargy during session and demonstrated increased arousal as session progressed and tasks were graded accordingly.  At end of session, patient returned to supine with bed placed per request, MD entered for round.  Secure message sent do RN/NT as patient is on bedpan to follow up.   Therapist also send secure message to patient's CM as patient's spouse requested to discuss d/c planning as she is concerned that patient may d/c home again instead of to another level of care with 24/7 support.   Pt making progress toward goals, will continue to follow POC. Discharge recommendation remains appropriate.        If plan is discharge home, recommend the following:  A lot of help with bathing/dressing/bathroom;A lot of help with walking and/or transfers;Help with stairs or ramp for entrance;Assistance with cooking/housework   Equipment Recommendations       Recommendations for Other Services      Precautions / Restrictions Precautions Precautions: Fall       Mobility Bed Mobility Overal bed mobility: Needs Assistance Bed Mobility: Supine to Sit, Sit to Supine Rolling: Mod assist   Supine to sit: Max assist Sit to supine: Max assist   General bed mobility comments: poor motor planning and task sequencing    Transfers Overall transfer level: Needs assistance                 General transfer comment: patient initially very lethargic, with increased arousal as session progressed.  At EOB, patient with decreased sitting balance with posterior leaning noted, cuing required for weight shifting forward.     Balance Overall balance assessment: Needs assistance Sitting-balance support: No upper extremity supported, Feet supported Sitting balance-Leahy Scale: Poor Sitting balance - Comments: patient initially very lethargic, with increased arousal as session progressed. At EOB, patient with decreased sitting balance with posterior leaning noted, cuing required for weight shifting forward. Postural control: Posterior lean  ADL either performed or assessed with clinical judgement   ADL       Grooming: Wash/dry face;Oral care;Sitting;Cueing for sequencing Grooming Details (indicate cue type and reason): RUE used for  task, LUE with IV line.  Completed at [facility]                                    Extremity/Trunk Assessment Upper Extremity Assessment Upper Extremity Assessment: Generalized weakness RUE Deficits / Details: 5th finger bandaged, able to WB through RW RUE Coordination: decreased fine motor            Vision Baseline Vision/History: 1 Wears glasses (prosthetic L eye, wears glasses) Patient Visual Report: No change from baseline     Perception     Praxis     Communication Communication Communication: Impaired Factors Affecting Communication: Hearing impaired   Cognition Arousal: Lethargic Behavior During Therapy: WFL for tasks assessed/performed               OT - Cognition Comments: Requires increased time for processing/problem-solving.                 Following commands: Impaired Following commands impaired: Follows one step commands inconsistently      Cueing   Cueing Techniques: Verbal cues, Tactile cues, Visual cues  Exercises Other Exercises Other Exercises: Unsupported sitting balance during basic ADLs at EOB    Shoulder Instructions       General Comments      Pertinent Vitals/ Pain       Pain Assessment Pain Assessment: Faces Pain Score: 2  Pain Location: groin during bed mobility  Home Living                                          Prior Functioning/Environment              Frequency  Min 2X/week        Progress Toward Goals  OT Goals(current goals can now be found in the care plan section)  Progress towards OT goals: Progressing toward goals     Plan      Co-evaluation                 AM-PAC OT 6 Clicks Daily Activity     Outcome Measure   Help from another person eating meals?: A Little Help from another person taking care of personal grooming?: A Little Help from another person toileting, which includes using toliet, bedpan, or urinal?: A Lot Help from another person  bathing (including washing, rinsing, drying)?: A Lot Help from another person to put on and taking off regular upper body clothing?: A Lot Help from another person to put on and taking off regular lower body clothing?: A Lot 6 Click Score: 14    End of Session    OT Visit Diagnosis: Other abnormalities of gait and mobility (R26.89);Repeated falls (R29.6);Muscle weakness (generalized) (M62.81)   Activity Tolerance Patient tolerated treatment well   Patient Left in chair;with call bell/phone within reach;with bed alarm set;with nursing/sitter in room;with family/visitor present (MD entered room during session.  Patient on bedpan (per request) and RN notified via secure chat as patient 's spouse was notified to use call button for assistance when patient is ready to have bed pan removed.)   Nurse Communication  Time: 8995-8956 OT Time Calculation (min): 39 min  Charges: OT General Charges $OT Visit: 1 Visit   [name] OTR/L   [name] 06/04/2024, 11:01 AM

## 2024-06-04 NOTE — TOC Initial Note (Addendum)
 Transition of Care (TOC) - Initial/Assessment Note    Patient Details  Name: [name]. MRN: [id number] Date of Birth: 23-Apr-1932  Transition of Care [facility]) CM/SW Contact:    Elouise LULLA Capri, RN 06/04/2024, 4:18 PM  Clinical Narrative:                  CM to patient's room regarding case management assessment. CM introduced case management role and discharge care planning process. Patient's wife, [name] and patient's son, Glendia at patient's side; patient in recliner. Patient provided permission for CM to discuss care with patient's wife and patient's son. Patient lives with patient's wife. No previous SNF experience.walker, BSC and hospital bed(delivered today)  CM and patient's wife discussed SNF recommendations. Per patient's wife prefers [facility], [facility] and [facility].   CM will perform SNF workup.   CM review in [place] MUST, PASSR is [phone]  CM faxed SNF referral to [facility], [facility] and [facility].   Expected Discharge Plan: Skilled Nursing Facility Barriers to Discharge: Continued Medical Work up   Patient Goals and CMS Choice    SNF   Expected Discharge Plan and Services    SNF   Prior Living Arrangements/Services   Lives with:: Spouse Patient language and need for interpreter reviewed:: No        Need for Family Participation in Patient Care: Yes (Comment) Care giver support system in place?: Yes (comment) Current home services: DME Criminal Activity/Legal Involvement Pertinent to Current Situation/Hospitalization: No - Comment as needed  Activities of Daily Living      Permission Sought/Granted Permission sought to share information with : Case Manager, Family Supports Permission granted to share information with : Yes, Verbal Permission Granted  Share Information with NAME: [name] Puff/Uriyah MarkClayton/Scott Therapist, music granted to share info w Relationship: Wife/Son/Son      Emotional Assessment Appearance:: Appears stated age Attitude/Demeanor/Rapport: Engaged Affect (typically observed): Calm Orientation: : Oriented to Place, Oriented to Self, Oriented to Situation Alcohol  / Substance Use: Alcohol  Use (Last alcohol  2 weeks ago)    Admission diagnosis:  Primary hypertension [I10] CAP (community acquired pneumonia) [J18.9] Patient Active Problem List   Diagnosis Date Noted   Lobar pneumonia (HCC) 06/03/2024   CAP (community acquired pneumonia) 06/03/2024   Tenosynovitis of finger 05/25/2024   Tenosynovitis of finger and hand 05/24/2024   Olecranon bursitis, left elbow 09/06/2017   Lower urinary tract symptoms (LUTS) 10/18/2016   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   Nasal bleeding 01/08/2016   Uncontrolled hypertension 01/08/2016   Idiopathic thrombocytopenic purpura (HCC) 09/01/2015   Amaurosis fugax of right eye 08/10/2015   Acute renal failure (HCC) 04/06/2015   History of ITP 03/22/2015   Syncope 12/21/2014   Pure hypercholesterolemia 08/27/2014   Biceps tendon tear 04/09/2014   Cancer of prostate w/low recurrence risk (T1-2a, Gleason<7 & PSA<10) (HCC) 07/30/2012   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 [place] [address] Phone: [phone] Fax: [phone]  [facility] LONG - [facility] [address] [place] [address] Phone: [phone] Fax: [phone]  [facility] [place], [place] - [address] [place] [address] Phone: [phone] Fax: [phone]  [id number] - [place], [place] - [address]  [address] [place] [address] Phone: [phone] Fax: [phone]  [id number] - [place], [place] - [address] [place]  [address] Phone: [phone] Fax: [phone]     Social Drivers of Health (SDOH) Social History: SDOH Screenings   Food Insecurity: No Food Insecurity (05/24/2024)  Housing: Low Risk  (05/24/2024)  Transportation Needs: No Transportation Needs (05/24/2024)  Utilities: Not At Risk (05/24/2024)  Financial Resource Strain: Patient Declined (11/06/2023)   Received from [facility]  Social Connections: Moderately Integrated (05/24/2024)  Tobacco Use: Low Risk  (05/31/2024)   SDOH Interventions:     Readmission Risk Interventions     No data to display

## 2024-06-05 DIAGNOSIS — J189 Pneumonia, unspecified organism: Secondary | ICD-10-CM | POA: Diagnosis not present

## 2024-06-05 LAB — BASIC METABOLIC PANEL WITH GFR
Anion gap: 7 (ref 5–15)
BUN: 17 mg/dL (ref 8–23)
CO2: 22 mmol/L (ref 22–32)
Calcium: 8.6 mg/dL — ABNORMAL LOW (ref 8.9–10.3)
Chloride: 104 mmol/L (ref 98–111)
Creatinine, Ser: 0.71 mg/dL (ref 0.61–1.24)
GFR, Estimated: >60 mL/min (ref >60)
Glucose, Bld: 87 mg/dL (ref 70–99)
Potassium: 3.4 mmol/L — ABNORMAL LOW (ref 3.5–5.1)
Sodium: 133 mmol/L — ABNORMAL LOW (ref 135–145)

## 2024-06-05 LAB — CBC
HCT: 30.8 % — ABNORMAL LOW (ref 39.0–52.0)
Hemoglobin: 10.8 g/dL — ABNORMAL LOW (ref 13.0–17.0)
MCH: 34.3 pg — ABNORMAL HIGH (ref 26.0–34.0)
MCHC: 35.1 g/dL (ref 30.0–36.0)
MCV: 97.8 fL (ref 80.0–100.0)
Platelets: 442 K/uL — ABNORMAL HIGH (ref 150–400)
RBC: 3.15 MIL/uL — ABNORMAL LOW (ref 4.22–5.81)
RDW: 13.2 % (ref 11.5–15.5)
WBC: 11.5 K/uL — ABNORMAL HIGH (ref 4.0–10.5)
nRBC: 0 % (ref 0.0–0.2)

## 2024-06-05 LAB — PHOSPHORUS: Phosphorus: 2.9 mg/dL (ref 2.5–4.6)

## 2024-06-05 LAB — LACTIC ACID, PLASMA: Lactic Acid, Venous: 0.7 mmol/L (ref 0.5–1.9)

## 2024-06-05 MED ORDER — POTASSIUM CHLORIDE CRYS ER 20 MEQ PO TBCR
40.0000 meq | EXTENDED_RELEASE_TABLET | Freq: Once | ORAL | Status: AC
  Administered 2024-06-05: 40 meq via ORAL
  Filled 2024-06-05: qty 2

## 2024-06-05 NOTE — Progress Notes (Addendum)
 Progress Note    [name].  FMW:[id number] DOB: Aug 04, 1932  DOA: 05/31/2024 PCP: [name], MD      Brief Narrative:    Medical records reviewed and are as summarized below:  [name]. is a 89 y.o. male  with medical history significant of recently diagnosed right hand tenosynovitis, HTN, history of drug-induced autoimmune hemolytic anemia, prostate cancer history of ITP, brought in by family member for evaluation of ambulation difficulties.  He was recently from 05/24/2024 through 05/31/2024 after hospitalization for tenosynovitis of right fifth finger, s/p I&D and discharged home on Augmentin .  His wife states that patient has been very weak and has become increasingly difficult to take care of him at home (since discharge from the hospital).  Reportedly, his blood pressure at home was in the 200s.  BP with EMS was 199/102.  Patient was brought to the ED on 05/31/2024 because of severe hypertension.  Patient had been boarding in the ED.  He was evaluated by a physical therapist who recommended discharge to acute rehab.  While in the ED, he developed a dry cough and low-grade fever on 06/02/2024.  Chest x-ray showed left lower lobe pneumonia.  Hospitalist team was consulted for admission.      Assessment/Plan:   Principal Problem:   CAP (community acquired pneumonia) Active Problems:   Lobar pneumonia (HCC)    Left lower lobe pneumonia: Continue IV cefepime . No growth on blood cultures thus far.  Respiratory panel was negative.  SARS-CoV-2, RSV and influenza A and B were negative.   General weakness, ambulatory dysfunction: PT recommended discharge to SNF.  Follow-up with TOC to assist with disposition   Recent tenosynovitis of right fingers and right hand: Patient had been discharged on Augmentin  when he was discharged from the hospital on 05/31/2024. Augmentin  has been held since he is on IV cefepime . Consulted Dr. Earnestine Blanch, orthopedic  surgeon, to reevaluate right hand surgical wound and determine whether stitches need to be removed.   Hypertensive urgency: BP improved.  Continue antihypertensives.   Hypokalemia: Replete potassium and monitor levels Chronic hyponatremia: Asymptomatic   Comorbidities include history of ITP, autoimmune hemolytic anemia, hyperlipidemia   Diet Order             Diet Heart Fluid consistency: Thin  Diet effective now                            Consultants: Orthopedic surgeon  Procedures: None    Medications:    amLODipine   5 mg Oral Daily   cholecalciferol   5,000 Units Oral Daily   citalopram   20 mg Oral Daily   dorzolamide   1 drop Right Eye BID   enoxaparin  (LOVENOX ) injection  40 mg Subcutaneous Q24H   guaiFENesin   1,200 mg Oral BID   latanoprost   1 drop Right Eye QHS   mirabegron  ER  25 mg Oral Daily   pantoprazole   40 mg Oral Daily   pravastatin   20 mg Oral QHS   propranolol   40 mg Oral BID   QUEtiapine   25 mg Oral QHS   Continuous Infusions:  ceFEPime  (MAXIPIME ) IV 2 g (06/05/24 0950)     Anti-infectives (From admission, onward)    Start     Dose/Rate Route Frequency Ordered Stop   06/03/24 2200  ceFEPIme  (MAXIPIME ) 2 g in sodium chloride  0.9 % 100 mL IVPB        2 g  200 mL/hr over 30 Minutes Intravenous Every 12 hours 06/03/24 1054     06/03/24 0945  vancomycin  (VANCOCIN ) IVPB 1000 mg/200 mL premix        1,000 mg 200 mL/hr over 60 Minutes Intravenous  Once 06/03/24 9062 06/03/24 1216   06/03/24 0945  ceFEPIme  (MAXIPIME ) 2 g in sodium chloride  0.9 % 100 mL IVPB        2 g 200 mL/hr over 30 Minutes Intravenous  Once 06/03/24 0937 06/03/24 1012   05/31/24 2345  amoxicillin -clavulanate (AUGMENTIN ) 875-125 MG per tablet 1 tablet  Status:  Discontinued        1 tablet Oral Every 12 hours 05/31/24 2337 06/03/24 1054              Family Communication/Anticipated D/C date and plan/Code Status   DVT prophylaxis: enoxaparin  (LOVENOX )  injection 40 mg Start: 06/03/24 2200     Code Status: Full Code  Family Communication: Plan discussed with his wife at the bedside Disposition Plan: Plan to discharge to SNF   Status is: Inpatient Remains inpatient appropriate because: Pneumonia, general weakness         Subjective:   Interval events noted.  He feels better.  No new complaints. Occasional cough.  No shortness of breath or chest pain.  His wife (at the bedside) inquired about stitches on the right hand from recent surgery.  Objective:    Vitals:   06/04/24 1947 06/04/24 2146 06/05/24 0442 06/05/24 0740  BP: (!) 115/54 (!) 115/54 130/63 (!) 152/67  Pulse: 79 79 67 74  Resp: 20  14 16   Temp: 98.6 F (37 C)  98 F (36.7 C) 98.7 F (37.1 C)  TempSrc:    Oral  SpO2: 99%  98% 100%   No data found.   Intake/Output Summary (Last 24 hours) at 06/05/2024 1048 Last data filed at 06/05/2024 0900 Gross per 24 hour  Intake 80 ml  Output 500 ml  Net -420 ml   There were no vitals filed for this visit.  Exam:  GEN: NAD SKIN: Warm and dry EYES: No pallor or icterus ENT: MMM CV: RRR PULM: Rhonchi and rales heard at left lung base ABD: soft, ND, NT, +BS CNS: AAO x 3, non focal EXT: No edema or tenderness.  Dressing on right hand is clean, dry and intact       Data Reviewed:   I have personally reviewed following labs and imaging studies:  Labs: Labs show the following:   Basic Metabolic Panel: Recent Labs  Lab 05/30/24 0541 06/01/24 0121 06/03/24 0909 06/05/24 0426  NA 130* 131* 132* 133*  K 3.8 3.5 3.7 3.4*  CL 97* 101 101 104  CO2 23 22 23 22   GLUCOSE 97 107* 100* 87  BUN 20 19 15 17   CREATININE 0.66 0.67 0.58* 0.71  CALCIUM 8.4* 8.3* 8.6* 8.6*  PHOS  --   --   --  2.9   GFR Estimated Creatinine Clearance: 58.5 mL/min (by C-G formula based on SCr of 0.71 mg/dL). Liver Function Tests: Recent Labs  Lab 06/01/24 0121  AST 27  ALT 24  ALKPHOS 95  BILITOT 0.8  PROT 6.9   ALBUMIN 2.7*   No results for input(s): LIPASE, AMYLASE in the last 168 hours. No results for input(s): AMMONIA in the last 168 hours. Coagulation profile No results for input(s): INR, PROTIME in the last 168 hours.  CBC: Recent Labs  Lab 05/30/24 0541 06/01/24 0121 06/03/24 0909 06/05/24 0426  WBC  11.9* 10.9* 11.9* 11.5*  NEUTROABS  --  7.8*  --   --   HGB 11.8* 12.0* 11.9* 10.8*  HCT 33.8* 34.3* 34.3* 30.8*  MCV 96.3 97.4 97.7 97.8  PLT 314 331 394 442*   Cardiac Enzymes: No results for input(s): CKTOTAL, CKMB, CKMBINDEX, TROPONINI in the last 168 hours. BNP (last 3 results) No results for input(s): PROBNP in the last 8760 hours. CBG: No results for input(s): GLUCAP in the last 168 hours. D-Dimer: No results for input(s): DDIMER in the last 72 hours. Hgb A1c: No results for input(s): HGBA1C in the last 72 hours. Lipid Profile: No results for input(s): CHOL, HDL, LDLCALC, TRIG, CHOLHDL, LDLDIRECT in the last 72 hours. Thyroid  function studies: No results for input(s): TSH, T4TOTAL, T3FREE, THYROIDAB in the last 72 hours.  Invalid input(s): FREET3 Anemia work up: No results for input(s): VITAMINB12, FOLATE, FERRITIN, TIBC, IRON, RETICCTPCT in the last 72 hours. Sepsis Labs: Recent Labs  Lab 05/30/24 0541 06/01/24 0121 06/03/24 0909 06/03/24 0937 06/03/24 1505 06/05/24 0426  PROCALCITON  --   --  <0.10  --   --   --   WBC 11.9* 10.9* 11.9*  --   --  11.5*  LATICACIDVEN  --   --   --  0.8 2.4* 0.7    Microbiology Recent Results (from the past 240 hours)  Resp panel by RT-PCR (RSV, Flu A&B, Covid) Nasopharyngeal Swab     Status: None   Collection Time: 06/03/24  9:37 AM   Specimen: Nasopharyngeal Swab; Nasal Swab  Result Value Ref Range Status   SARS Coronavirus 2 by RT PCR NEGATIVE NEGATIVE Final    Comment: (NOTE) SARS-CoV-2 target nucleic acids are NOT DETECTED.  The SARS-CoV-2 RNA is  generally detectable in upper respiratory specimens during the acute phase of infection. The lowest concentration of SARS-CoV-2 viral copies this assay can detect is 138 copies/mL. A negative result does not preclude SARS-Cov-2 infection and should not be used as the sole basis for treatment or other patient management decisions. A negative result may occur with  improper specimen collection/handling, submission of specimen other than nasopharyngeal swab, presence of viral mutation(s) within the areas targeted by this assay, and inadequate number of viral copies(<138 copies/mL). A negative result must be combined with clinical observations, patient history, and epidemiological information. The expected result is Negative.  Fact Sheet for Patients:  BloggerCourse.com  Fact Sheet for Healthcare Providers:  SeriousBroker.it  This test is no t yet approved or cleared by the [place]  FDA and  has been authorized for detection and/or diagnosis of SARS-CoV-2 by FDA under an Emergency Use Authorization (EUA). This EUA will remain  in effect (meaning this test can be used) for the duration of the COVID-19 declaration under Section 564(b)(1) of the Act, 21 U.S.C.section 360bbb-3(b)(1), unless the authorization is terminated  or revoked sooner.       Influenza A by PCR NEGATIVE NEGATIVE Final   Influenza B by PCR NEGATIVE NEGATIVE Final    Comment: (NOTE) The Xpert Xpress SARS-CoV-2/FLU/RSV plus assay is intended as an aid in the diagnosis of influenza from Nasopharyngeal swab specimens and should not be used as a sole basis for treatment. Nasal washings and aspirates are unacceptable for Xpert Xpress SARS-CoV-2/FLU/RSV testing.  Fact Sheet for Patients: BloggerCourse.com  Fact Sheet for Healthcare Providers: SeriousBroker.it  This test is not yet approved or cleared by the [place] FDA and has been authorized for detection and/or diagnosis of SARS-CoV-2 by FDA  under an Emergency Use Authorization (EUA). This EUA will remain in effect (meaning this test can be used) for the duration of the COVID-19 declaration under Section 564(b)(1) of the Act, 21 U.S.C. section 360bbb-3(b)(1), unless the authorization is terminated or revoked.     Resp Syncytial Virus by PCR NEGATIVE NEGATIVE Final    Comment: (NOTE) Fact Sheet for Patients: BloggerCourse.com  Fact Sheet for Healthcare Providers: SeriousBroker.it  This test is not yet approved or cleared by the [place]  FDA and has been authorized for detection and/or diagnosis of SARS-CoV-2 by FDA under an Emergency Use Authorization (EUA). This EUA will remain in effect (meaning this test can be used) for the duration of the COVID-19 declaration under Section 564(b)(1) of the Act, 21 U.S.C. section 360bbb-3(b)(1), unless the authorization is terminated or revoked.  Performed at [facility], [address] [place], [place] [address]   Respiratory (~20 pathogens) panel by PCR     Status: None   Collection Time: 06/03/24  9:37 AM   Specimen: Nasopharyngeal Swab; Respiratory  Result Value Ref Range Status   Adenovirus NOT DETECTED NOT DETECTED Final   Coronavirus 229E NOT DETECTED NOT DETECTED Final    Comment: (NOTE) The Coronavirus on the Respiratory Panel, DOES NOT test for the novel  Coronavirus (2019 nCoV)    Coronavirus HKU1 NOT DETECTED NOT DETECTED Final   Coronavirus NL63 NOT DETECTED NOT DETECTED Final   Coronavirus OC43 NOT DETECTED NOT DETECTED Final   Metapneumovirus NOT DETECTED NOT DETECTED Final   Rhinovirus / Enterovirus NOT DETECTED NOT DETECTED Final   Influenza A NOT DETECTED NOT DETECTED Final   Influenza B NOT DETECTED NOT DETECTED Final   Parainfluenza Virus 1 NOT DETECTED NOT DETECTED Final   Parainfluenza Virus 2 NOT  DETECTED NOT DETECTED Final   Parainfluenza Virus 3 NOT DETECTED NOT DETECTED Final   Parainfluenza Virus 4 NOT DETECTED NOT DETECTED Final   Respiratory Syncytial Virus NOT DETECTED NOT DETECTED Final   Bordetella pertussis NOT DETECTED NOT DETECTED Final   Bordetella Parapertussis NOT DETECTED NOT DETECTED Final   Chlamydophila pneumoniae NOT DETECTED NOT DETECTED Final   Mycoplasma pneumoniae NOT DETECTED NOT DETECTED Final    Comment: Performed at [facility], [address], [place], [place] [address]  Blood Culture (routine x 2)     Status: None (Preliminary result)   Collection Time: 06/03/24 10:45 AM   Specimen: BLOOD  Result Value Ref Range Status   Specimen Description BLOOD LEFT ANTECUBITAL  Final   Special Requests   Final    BOTTLES DRAWN AEROBIC AND ANAEROBIC Blood Culture adequate volume   Culture   Final    NO GROWTH 2 DAYS Performed at [facility], [address] [place], [place] [address]    Report Status PENDING  Incomplete  Blood Culture (routine x 2)     Status: None (Preliminary result)   Collection Time: 06/03/24 10:45 AM   Specimen: BLOOD  Result Value Ref Range Status   Specimen Description BLOOD LEFT ARM  Final   Special Requests   Final    BOTTLES DRAWN AEROBIC AND ANAEROBIC Blood Culture results may not be optimal due to an inadequate volume of blood received in culture bottles   Culture   Final    NO GROWTH 2 DAYS Performed at [facility], [address] [place], [place] [address]    Report Status PENDING  Incomplete  MRSA Next Gen by PCR, Nasal  Status: None   Collection Time: 06/03/24  1:55 PM  Result Value Ref Range Status   MRSA by PCR Next Gen NOT DETECTED NOT DETECTED Final    Comment: (NOTE) The GeneXpert MRSA Assay (FDA approved for NASAL specimens only), is one component of a comprehensive MRSA colonization surveillance program. It is not intended to diagnose MRSA infection nor to guide or monitor  treatment for MRSA infections. Test performance is not FDA approved in patients less than 59 years old. Performed at [facility], [address] [place], [place] [address]     Procedures and diagnostic studies:  No results found.              LOS: 1 day   Deric Bocock  [facility]   Pager on [email or web address]. If 7PM-7AM, please contact night-coverage at [email or web address]     06/05/2024, 10:48 AM

## 2024-06-05 NOTE — Progress Notes (Signed)
 Occupational Therapy Treatment Patient Details Name: [name]. MRN: [id number] DOB: Feb 03, 1932 Today's Date: 06/05/2024   History of present illness [name]. is a 89 y.o. male with recent past medical history of flexor tenosynovitis on the right hand that required surgery on 05/24/2024, discharged ~1 week later and retuned on the same day due to hypertension as well as inability of family to care for him at home.  Family reports they have been unable to get patient out of bed without significant assistance and he has been unable to ambulate since he has been home. Patient did have an episode of high blood pressure at home with systolics in the 240s however patient's systolic is 144 on reassessment during my evaluation.  Patient was supposed to be on Norvasc  however there is question as to whether he took it today but for being discharged  ROS: Patient currently denies any vision changes, tinnitus, difficulty speaking, facial droop, sore throat, chest pain, shortness of breath, abdominal pain, nausea/vomiting/diarrhea, dysuria, or weakness/numbness/paresthesias in any extremity.   OT comments  Pt seen for OT treatment on this date. Upon arrival to room pt semi supine in bed, agreeable to tx. Pt requires MAXA to come to the EOB in prep to transfers. Pt required lots of verbal encouragement from author and family to participate. Pt required MINA for lift off from elevated EOB, close CGA (step pivot) for simulated toilet transfer from EOB<>recliner. Verbal cues throughout required for safety and DME management, slight R knee buckling noted, no LOB observed. VSS. Pt making good progress toward goals, will continue to follow POC. Discharge recommendation remains appropriate.        If plan is discharge home, recommend the following:  A lot of help with bathing/dressing/bathroom;A lot of help with walking and/or transfers;Help with stairs or ramp for entrance;Assistance with  cooking/housework   Equipment Recommendations  Tub/shower seat    Recommendations for Other Services      Precautions / Restrictions Precautions Precautions: Fall Recall of Precautions/Restrictions: Impaired Restrictions Weight Bearing Restrictions Per Provider Order: No RUE Weight Bearing Per Provider Order: Weight bearing as tolerated Other Position/Activity Restrictions: WBAT to R hand per Dr. Tobie       Mobility Bed Mobility Overal bed mobility: Needs Assistance Bed Mobility: Supine to Sit     Supine to sit: HOB elevated, Used rails, Max assist     General bed mobility comments: Pt required physical assistance for support of trunk and LE during bed mobility    Transfers Overall transfer level: Needs assistance Equipment used: Rolling walker (2 wheels) Transfers: Sit to/from Stand, Bed to chair/wheelchair/BSC Sit to Stand: Min assist     Step pivot transfers: Min assist, Contact guard assist, From elevated surface     General transfer comment: Pt self limiting prior to transfer into recliner but benefits with some verbal encouragement to get started     Balance Overall balance assessment: Needs assistance Sitting-balance support: No upper extremity supported, Feet supported Sitting balance-Leahy Scale: Fair Sitting balance - Comments: With verbal cues able to correct R later lean in sitting Postural control: Right lateral lean Standing balance support: Bilateral upper extremity supported, No upper extremity supported Standing balance-Leahy Scale: Fair Standing balance comment: Limited prolonged standing tolerance, noted slight R knee buckling, often narrow BOS                           ADL either performed or assessed with clinical  judgement   ADL Overall ADL's : Needs assistance/impaired                 Upper Body Dressing : Minimal assistance;Sitting Upper Body Dressing Details (indicate cue type and reason): Donning gown     Toilet  Transfer: Ambulation;Rolling walker (2 wheels);Minimal assistance;Cueing for sequencing;Cueing for safety (Step pivot into recliner from the EOB)           Functional mobility during ADLs: Minimal assistance;Cueing for sequencing;Cueing for safety;Rolling walker (2 wheels);Contact guard assist General ADL Comments: Close CGA during simulated toilet transfer    Communication Communication Communication: Impaired Factors Affecting Communication: Hearing impaired   Cognition Arousal: Alert Behavior During Therapy: Lability Cognition: Cognition impaired     Awareness: Intellectual awareness intact Memory impairment (select all impairments): Short-term memory Attention impairment (select first level of impairment): Sustained attention, Selective attention Executive functioning impairment (select all impairments): Initiation, Sequencing, Reasoning, Problem solving OT - Cognition Comments: Requires increased time for processing/problem-solving.                 Following commands: Impaired Following commands impaired: Follows one step commands inconsistently      Cueing   Cueing Techniques: Verbal cues, Tactile cues, Visual cues  Exercises Exercises: Other exercises Other Exercises Other Exercises: Edu: Role of OT session, benefits of OOB mobility, safe transfer technique    Shoulder Instructions       General Comments Lots of visitors that encourage him to get better    Pertinent Vitals/ Pain       Pain Assessment Pain Assessment: Faces Faces Pain Scale: Hurts little more Pain Location: R hand Pain Descriptors / Indicators: Discomfort Pain Intervention(s): Monitored during session, Limited activity within patient's tolerance  Home Living                                          Prior Functioning/Environment              Frequency  Min 2X/week        Progress Toward Goals  OT Goals(current goals can now be found in the care plan  section)  Progress towards OT goals: Progressing toward goals  Acute Rehab OT Goals OT Goal Formulation: With patient/family Time For Goal Achievement: 06/15/24 Potential to Achieve Goals: Good ADL Goals Pt Will Perform Grooming: with modified independence;standing Pt Will Perform Upper Body Dressing: with caregiver independent in assisting;sitting Pt Will Perform Lower Body Dressing: sit to/from stand;with caregiver independent in assisting Pt Will Transfer to Toilet: with modified independence;stand pivot transfer Additional ADL Goal #2: Pt will demonstrate safe use of RW.  Plan      Co-evaluation                 AM-PAC OT 6 Clicks Daily Activity     Outcome Measure   Help from another person eating meals?: A Little Help from another person taking care of personal grooming?: A Little Help from another person toileting, which includes using toliet, bedpan, or urinal?: A Lot Help from another person bathing (including washing, rinsing, drying)?: A Lot Help from another person to put on and taking off regular upper body clothing?: A Lot Help from another person to put on and taking off regular lower body clothing?: A Lot 6 Click Score: 14    End of Session Equipment Utilized During Treatment: Rolling walker (2 wheels)  OT  Visit Diagnosis: Other abnormalities of gait and mobility (R26.89);Repeated falls (R29.6);Muscle weakness (generalized) (M62.81)   Activity Tolerance Patient tolerated treatment well   Patient Left in chair;with call bell/phone within reach;with chair alarm set;with family/visitor present   Nurse Communication Mobility status        Time: 8672-8643 OT Time Calculation (min): 29 min  Charges: OT General Charges $OT Visit: 1 Visit OT Treatments $Self Care/Home Management : 8-22 mins $Therapeutic Activity: 8-22 mins  Larraine Colas M.S. OTR/L  06/05/24, 3:19 PM

## 2024-06-05 NOTE — TOC Progression Note (Signed)
 Transition of Care (TOC) - Progression Note    Patient Details  Name: [name]. MRN: [id number] Date of Birth: Jun 08, 1932  Transition of Care [facility]) CM/SW Contact  Elouise LULLA Capri, RN 06/05/2024, 3:07 PM  Clinical Narrative:     Noted, two accepting SNFs, [facility] and [facility]. CM secure message to Alfonso, Admissions, [facility] regarding bed availability. Per Alfonso [facility] is extending bed offer. CM call to [facility] [employer], phone: [phone] for SNF and BLS auth.   Expected Discharge Plan: Skilled Nursing Facility Barriers to Discharge: Continued Medical Work up  Expected Discharge Plan and Services    SNF  Social Determinants of Health (SDOH) Interventions SDOH Screenings   Food Insecurity: No Food Insecurity (05/24/2024)  Housing: Low Risk  (05/24/2024)  Transportation Needs: No Transportation Needs (05/24/2024)  Utilities: Not At Risk (05/24/2024)  Financial Resource Strain: Patient Declined (11/06/2023)   Received from [facility]  Social Connections: Moderately Integrated (05/24/2024)  Tobacco Use: Low Risk  (05/31/2024)    Readmission Risk Interventions     No data to display

## 2024-06-05 NOTE — Plan of Care (Signed)

## 2024-06-06 DIAGNOSIS — J189 Pneumonia, unspecified organism: Secondary | ICD-10-CM | POA: Diagnosis not present

## 2024-06-06 LAB — BASIC METABOLIC PANEL WITH GFR
Anion gap: 9 (ref 5–15)
BUN: 18 mg/dL (ref 8–23)
CO2: 21 mmol/L — ABNORMAL LOW (ref 22–32)
Calcium: 8.4 mg/dL — ABNORMAL LOW (ref 8.9–10.3)
Chloride: 99 mmol/L (ref 98–111)
Creatinine, Ser: 0.63 mg/dL (ref 0.61–1.24)
GFR, Estimated: >60 mL/min (ref >60)
Glucose, Bld: 92 mg/dL (ref 70–99)
Potassium: 4.1 mmol/L (ref 3.5–5.1)
Sodium: 129 mmol/L — ABNORMAL LOW (ref 135–145)

## 2024-06-06 LAB — CBC
HCT: 30.9 % — ABNORMAL LOW (ref 39.0–52.0)
Hemoglobin: 10.8 g/dL — ABNORMAL LOW (ref 13.0–17.0)
MCH: 34 pg (ref 26.0–34.0)
MCHC: 35 g/dL (ref 30.0–36.0)
MCV: 97.2 fL (ref 80.0–100.0)
Platelets: 482 K/uL — ABNORMAL HIGH (ref 150–400)
RBC: 3.18 MIL/uL — ABNORMAL LOW (ref 4.22–5.81)
RDW: 12.9 % (ref 11.5–15.5)
WBC: 15.3 K/uL — ABNORMAL HIGH (ref 4.0–10.5)
nRBC: 0 % (ref 0.0–0.2)

## 2024-06-06 LAB — TSH: TSH: 0.9 u[IU]/mL (ref 0.350–4.500)

## 2024-06-06 LAB — SODIUM, URINE, RANDOM: Sodium, Ur: 25 mmol/L

## 2024-06-06 LAB — OSMOLALITY: Osmolality: 274 mosm/kg — ABNORMAL LOW (ref 275–295)

## 2024-06-06 LAB — MAGNESIUM: Magnesium: 1.9 mg/dL (ref 1.7–2.4)

## 2024-06-06 LAB — OSMOLALITY, URINE: Osmolality, Ur: 268 mosm/kg — ABNORMAL LOW (ref 300–900)

## 2024-06-06 LAB — CORTISOL: Cortisol, Plasma: 15 ug/dL

## 2024-06-06 MED ORDER — SODIUM CHLORIDE 1 G PO TABS
1.0000 g | ORAL_TABLET | Freq: Two times a day (BID) | ORAL | Status: DC
  Administered 2024-06-07 – 2024-06-09 (×5): 1 g via ORAL
  Filled 2024-06-06 (×5): qty 1

## 2024-06-06 MED ORDER — SODIUM CHLORIDE 1 G PO TABS
2.0000 g | ORAL_TABLET | Freq: Once | ORAL | Status: AC
  Administered 2024-06-06: 2 g via ORAL
  Filled 2024-06-06: qty 2

## 2024-06-06 MED ORDER — SODIUM CHLORIDE 1 G PO TABS: 1.0000 g | ORAL_TABLET | Freq: Two times a day (BID) | ORAL | Status: DC

## 2024-06-06 NOTE — Progress Notes (Signed)
 Na+ 129. MD notified.

## 2024-06-06 NOTE — Progress Notes (Addendum)
 Occupational Therapy Treatment Patient Details Name: [name]. MRN: [id number] DOB: 1932/11/11 Today's Date: 06/06/2024   History of present illness [name]. is a 89 y.o. male with recent past medical history of flexor tenosynovitis on the right hand that required surgery on 05/24/2024, discharged ~1 week later and retuned on the same day due to hypertension as well as inability of family to care for him at home.  Family reports they have been unable to get patient out of bed without significant assistance and he has been unable to ambulate since he has been home. Patient did have an episode of high blood pressure at home with systolics in the 240s however patient's systolic is 144 on reassessment during my evaluation.  Patient was supposed to be on Norvasc  however there is question as to whether he took it today but for being discharged  ROS: Patient currently denies any vision changes, tinnitus, difficulty speaking, facial droop, sore throat, chest pain, shortness of breath, abdominal pain, nausea/vomiting/diarrhea, dysuria, or weakness/numbness/paresthesias in any extremity.   OT comments  Pt is supine in bed on arrival. Easily arousable and agreeable to OT session. He denies pain. Pt performed bed mobility with Mod A for trunkal elevation and forward scoot. Pt required CGA progressing to Supervision for seated balance at EOB with PA in to remove sutures from R hand while seated at bedside. Pt demo STS from EOB to RW with Min A and needed to have urgent BM, was able to step pivot to [facility] using RW with CGA. Pt needed Min A for thoroughness with peri-care in standing at RW. He demo oral care seated on BSC with setup assist and min cueing, but able to use R hand as stabilizer. Purewick malfunction led to urination in floor requiring clean up, LB bathing/dressing with Min A. Pt ambulated ~5-6 ft to recliner using RW with CGA.  Pt left in recliner with all needs in place and will cont to  require skilled acute OT services to maximize his safety and IND to return to PLOF.       If plan is discharge home, recommend the following:  Help with stairs or ramp for entrance;Assistance with cooking/housework;A little help with walking and/or transfers;A little help with bathing/dressing/bathroom   Equipment Recommendations  Tub/shower seat    Recommendations for Other Services      Precautions / Restrictions Precautions Precautions: Fall Recall of Precautions/Restrictions: Impaired Restrictions Weight Bearing Restrictions Per Provider Order: No RUE Weight Bearing Per Provider Order: Weight bearing as tolerated Other Position/Activity Restrictions: WBAT to R hand per Dr. Tobie       Mobility Bed Mobility Overal bed mobility: Needs Assistance Bed Mobility: Supine to Sit     Supine to sit: Mod assist, HOB elevated, Used rails     General bed mobility comments: Mod A for trunkal elevation, able to bring BLEs to EOB, needs Min A to forward scoot fully to EOB    Transfers Overall transfer level: Needs assistance Equipment used: Rolling walker (2 wheels) Transfers: Sit to/from Stand, Bed to chair/wheelchair/BSC Sit to Stand: Min assist     Step pivot transfers: Contact guard assist, From elevated surface     General transfer comment: Min A STS from EOB to RW, cueing for hand placement able to perform step pivot to [facility] with CGA and ambulated ~5-6 ft to recliner     Balance Overall balance assessment: Needs assistance Sitting-balance support: No upper extremity supported, Feet supported Sitting balance-Leahy Scale: Good  Sitting balance - Comments: supervision for seated balance this date   Standing balance support: Reliant on assistive device for balance, Single extremity supported, During functional activity Standing balance-Leahy Scale: Fair Standing balance comment: standing peri-care at RW with unilateral support and CGA, no buckling noted this date                            ADL either performed or assessed with clinical judgement   ADL Overall ADL's : Needs assistance/impaired     Grooming: Oral care;Sitting;Set up;Cueing for sequencing Grooming Details (indicate cue type and reason): performed seated on BSC with set up assist, able to utilize both hands to perform task, RUE used as stabilizer hand mostly         Upper Body Dressing : Minimal assistance;Sitting Upper Body Dressing Details (indicate cue type and reason): Donning gown Lower Body Dressing: Minimal assistance Lower Body Dressing Details (indicate cue type and reason): min A for doffing soiled socks and donning clean ones Toilet Transfer: Rolling walker (2 wheels);Minimal assistance;Cueing for sequencing;Cueing for safety Toilet Transfer Details (indicate cue type and reason): step pivot from EOB to [facility] d/t urgency for BM-CGA and RW use with cueing for safety Toileting- Clothing Manipulation and Hygiene: Minimal assistance;Moderate assistance Toileting - Clothing Manipulation Details (indicate cue type and reason): Min/mod A for thoroughness with peri-care, able to perform mostly on his own            Extremity/Trunk Assessment              Vision       Perception     Praxis     Communication Communication Communication: Impaired Factors Affecting Communication: Hearing impaired   Cognition Arousal: Alert Behavior During Therapy: WFL for tasks assessed/performed Cognition: Cognition impaired     Awareness: Intellectual awareness intact Memory impairment (select all impairments): Short-term memory Attention impairment (select first level of impairment): Sustained attention, Selective attention Executive functioning impairment (select all impairments): Initiation, Sequencing, Reasoning, Problem solving OT - Cognition Comments: Requires increased time for processing/problem-solving.                 Following commands: Impaired Following  commands impaired: Follows one step commands with increased time      Cueing   Cueing Techniques: Verbal cues, Tactile cues, Visual cues  Exercises      Shoulder Instructions       General Comments sutures removed at bedside this time by MD once pt seated EOB; urinated in floor and had BM during session-notified nurse of need for new purewick on OT exit    Pertinent Vitals/ Pain       Pain Assessment Pain Assessment: Faces Faces Pain Scale: Hurts a little bit Pain Location: R hand during suture removal Pain Intervention(s): Monitored during session  Home Living                                          Prior Functioning/Environment              Frequency  Min 2X/week        Progress Toward Goals  OT Goals(current goals can now be found in the care plan section)  Progress towards OT goals: Progressing toward goals  Acute Rehab OT Goals Patient Stated Goal: get better OT Goal Formulation: With patient/family Time For Goal Achievement:  06/15/24 Potential to Achieve Goals: Good  Plan      Co-evaluation                 AM-PAC OT 6 Clicks Daily Activity     Outcome Measure   Help from another person eating meals?: None Help from another person taking care of personal grooming?: None Help from another person toileting, which includes using toliet, bedpan, or urinal?: A Lot Help from another person bathing (including washing, rinsing, drying)?: A Lot Help from another person to put on and taking off regular upper body clothing?: A Little Help from another person to put on and taking off regular lower body clothing?: A Lot 6 Click Score: 17    End of Session Equipment Utilized During Treatment: Rolling walker (2 wheels)  OT Visit Diagnosis: Other abnormalities of gait and mobility (R26.89);Repeated falls (R29.6);Muscle weakness (generalized) (M62.81)   Activity Tolerance Patient tolerated treatment well   Patient Left in  chair;with call bell/phone within reach;with chair alarm set;with family/visitor present   Nurse Communication Mobility status        Time: 1057-1140 OT Time Calculation (min): 43 min  Charges: OT General Charges $OT Visit: 1 Visit OT Treatments $Self Care/Home Management : 23-37 mins $Therapeutic Activity: 8-22 mins  Tinea Nobile, OTR/L  06/06/24, 1:45 PM   Eliannah Hinde E Marthena Whitmyer 06/06/2024, 1:39 PM

## 2024-06-06 NOTE — Progress Notes (Addendum)
 Progress Note    [name].  FMW:[id number] DOB: 1932-07-07  DOA: 05/31/2024 PCP: [name], MD      Brief Narrative:    Medical records reviewed and are as summarized below:  [name]. is a 89 y.o. male  with medical history significant of recently diagnosed right hand tenosynovitis, HTN, history of drug-induced autoimmune hemolytic anemia, prostate cancer history of ITP, brought in by family member for evaluation of ambulation difficulties.  He was recently from 05/24/2024 through 05/31/2024 after hospitalization for tenosynovitis of right fifth finger, s/p I&D and discharged home on Augmentin .  His wife states that patient has been very weak and has become increasingly difficult to take care of him at home (since discharge from the hospital).  Reportedly, his blood pressure at home was in the 200s.  BP with EMS was 199/102.  Patient was brought to the ED on 05/31/2024 because of severe hypertension.  Patient had been boarding in the ED.  He was evaluated by a physical therapist who recommended discharge to acute rehab.  While in the ED, he developed a dry cough and low-grade fever on 06/02/2024.  Chest x-ray showed left lower lobe pneumonia.  Hospitalist team was consulted for admission.      Assessment/Plan:   Principal Problem:   CAP (community acquired pneumonia) Active Problems:   Lobar pneumonia (HCC)    Left lower lobe pneumonia: Continue IV cefepime  through 06/07/2024 (to complete 5 days of treatment) No growth on blood cultures thus far.  Respiratory panel was negative.  SARS-CoV-2, RSV and influenza A and B were negative.  Worsening leukocytosis.  WBC up from 11.5-15.3.  Etiology of this is unclear since pneumonia appears to be improving.   General weakness, ambulatory dysfunction: PT recommended discharge to SNF.  Follow-up with TOC to assist with disposition   Recent tenosynovitis of right fingers and right hand: Krystal, PA, with  orthopedic surgery removed right hand sutures today.  Patient had been discharged on Augmentin  when he was discharged from the hospital on 05/31/2024. Augmentin  has been held since he is on IV cefepime .   Hypertensive urgency: BP improved.  Continue antihypertensives.   Systolic murmur heard loudest in the aortic and mitral area: Obtain 2D echo for further evaluation to rule out aortic stenosis, mitral regurgitation and other valvular abnormalities   Hypokalemia: Replete potassium and monitor levels Chronic hyponatremia: Asymptomatic.  Sodium down from 133-129. Low serum osmolality 274, normal urine sodium 25 and low urine osmolality 268.  TSH was 0.9.  Cortisol was 15. 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  Of note, patient has had intermittently low sodium dating back to February 10, 2015 when his sodium was 129. His wife was very concerned about decreasing sodium level.  Start salt tablets and monitor.   Comorbidities include history of ITP, autoimmune hemolytic anemia, hyperlipidemia   Plan discussed with his wife at the bedside    Diet Order             Diet regular Fluid consistency: Thin  Diet effective now                            Consultants: Orthopedic surgeon  Procedures: None    Medications:    amLODipine   5 mg Oral Daily   cholecalciferol   5,000 Units Oral Daily   citalopram   20 mg Oral Daily   dorzolamide   1 drop Right Eye  BID   enoxaparin  (LOVENOX ) injection  40 mg Subcutaneous Q24H   guaiFENesin   1,200 mg Oral BID   latanoprost   1 drop Right Eye QHS   mirabegron  ER  25 mg Oral Daily   pantoprazole   40 mg Oral Daily   pravastatin   20 mg Oral QHS   propranolol   40 mg Oral BID   QUEtiapine   25 mg Oral QHS   [START ON 06/07/2024] sodium chloride   1 g Oral BID WC   sodium chloride   2 g Oral Once   Continuous Infusions:  ceFEPime  (MAXIPIME ) IV 2 g (06/06/24 1044)     Anti-infectives (From admission, onward)    Start      Dose/Rate Route Frequency Ordered Stop   06/03/24 2200  ceFEPIme  (MAXIPIME ) 2 g in sodium chloride  0.9 % 100 mL IVPB        2 g 200 mL/hr over 30 Minutes Intravenous Every 12 hours 06/03/24 1054     06/03/24 0945  vancomycin  (VANCOCIN ) IVPB 1000 mg/200 mL premix        1,000 mg 200 mL/hr over 60 Minutes Intravenous  Once 06/03/24 0937 06/03/24 1216   06/03/24 0945  ceFEPIme  (MAXIPIME ) 2 g in sodium chloride  0.9 % 100 mL IVPB        2 g 200 mL/hr over 30 Minutes Intravenous  Once 06/03/24 0937 06/03/24 1012   05/31/24 2345  amoxicillin -clavulanate (AUGMENTIN ) 875-125 MG per tablet 1 tablet  Status:  Discontinued        1 tablet Oral Every 12 hours 05/31/24 2337 06/03/24 1054              Family Communication/Anticipated D/C date and plan/Code Status   DVT prophylaxis: enoxaparin  (LOVENOX ) injection 40 mg Start: 06/03/24 2200     Code Status: Full Code  Family Communication: Plan discussed with his wife at the bedside Disposition Plan: Plan to discharge to SNF   Status is: Inpatient Remains inpatient appropriate because: Pneumonia, general weakness         Subjective:   Interval events noted.  No complaints.  He feels better.  His wife was at the bedside and complained that patient is very weak.  Objective:    Vitals:   06/05/24 1936 06/05/24 2104 06/06/24 0410 06/06/24 0743  BP: 118/65 118/65 126/66 118/67  Pulse: 74 74 80 74  Resp: 18  18   Temp: 98.2 F (36.8 C)  99.1 F (37.3 C) 98.4 F (36.9 C)  TempSrc:      SpO2: 97%  98% 95%   No data found.   Intake/Output Summary (Last 24 hours) at 06/06/2024 1513 Last data filed at 06/06/2024 1457 Gross per 24 hour  Intake 240 ml  Output 1700 ml  Net -1460 ml   There were no vitals filed for this visit.  Exam:  GEN: NAD SKIN: Warm and dry EYES: No pallor or icterus ENT: MMM CV: RRR, systolic murmur loudest in the aortic and mitral areas PULM: Right basilar rales.  No rhonchi ABD: soft, ND, NT,  +BS CNS: AAO x 3, non focal EXT: Right hand I&D wound is healing well.      Data Reviewed:   I have personally reviewed following labs and imaging studies:  Labs: Labs show the following:   Basic Metabolic Panel: Recent Labs  Lab 06/01/24 0121 06/03/24 0909 06/05/24 0426 06/06/24 0426  NA 131* 132* 133* 129*  K 3.5 3.7 3.4* 4.1  CL 101 101 104 99  CO2 22 23 22  21*  GLUCOSE 107* 100* 87 92  BUN 19 15 17 18   CREATININE 0.67 0.58* 0.71 0.63  CALCIUM 8.3* 8.6* 8.6* 8.4*  MG  --   --   --  1.9  PHOS  --   --  2.9  --    GFR Estimated Creatinine Clearance: 58.5 mL/min (by C-G formula based on SCr of 0.63 mg/dL). Liver Function Tests: Recent Labs  Lab 06/01/24 0121  AST 27  ALT 24  ALKPHOS 95  BILITOT 0.8  PROT 6.9  ALBUMIN 2.7*   No results for input(s): LIPASE, AMYLASE in the last 168 hours. No results for input(s): AMMONIA in the last 168 hours. Coagulation profile No results for input(s): INR, PROTIME in the last 168 hours.  CBC: Recent Labs  Lab 06/01/24 0121 06/03/24 0909 06/05/24 0426 06/06/24 0426  WBC 10.9* 11.9* 11.5* 15.3*  NEUTROABS 7.8*  --   --   --   HGB 12.0* 11.9* 10.8* 10.8*  HCT 34.3* 34.3* 30.8* 30.9*  MCV 97.4 97.7 97.8 97.2  PLT 331 394 442* 482*   Cardiac Enzymes: No results for input(s): CKTOTAL, CKMB, CKMBINDEX, TROPONINI in the last 168 hours. BNP (last 3 results) No results for input(s): PROBNP in the last 8760 hours. CBG: No results for input(s): GLUCAP in the last 168 hours. D-Dimer: No results for input(s): DDIMER in the last 72 hours. Hgb A1c: No results for input(s): HGBA1C in the last 72 hours. Lipid Profile: No results for input(s): CHOL, HDL, LDLCALC, TRIG, CHOLHDL, LDLDIRECT in the last 72 hours. Thyroid  function studies: Recent Labs    06/06/24 0426  TSH 0.900   Anemia work up: No results for input(s): VITAMINB12, FOLATE, FERRITIN, TIBC, IRON, RETICCTPCT  in the last 72 hours. Sepsis Labs: Recent Labs  Lab 06/01/24 0121 06/03/24 0909 06/03/24 0937 06/03/24 1505 06/05/24 0426 06/06/24 0426  PROCALCITON  --  <0.10  --   --   --   --   WBC 10.9* 11.9*  --   --  11.5* 15.3*  LATICACIDVEN  --   --  0.8 2.4* 0.7  --     Microbiology Recent Results (from the past 240 hours)  Resp panel by RT-PCR (RSV, Flu A&B, Covid) Nasopharyngeal Swab     Status: None   Collection Time: 06/03/24  9:37 AM   Specimen: Nasopharyngeal Swab; Nasal Swab  Result Value Ref Range Status   SARS Coronavirus 2 by RT PCR NEGATIVE NEGATIVE Final    Comment: (NOTE) SARS-CoV-2 target nucleic acids are NOT DETECTED.  The SARS-CoV-2 RNA is generally detectable in upper respiratory specimens during the acute phase of infection. The lowest concentration of SARS-CoV-2 viral copies this assay can detect is 138 copies/mL. A negative result does not preclude SARS-Cov-2 infection and should not be used as the sole basis for treatment or other patient management decisions. A negative result may occur with  improper specimen collection/handling, submission of specimen other than nasopharyngeal swab, presence of viral mutation(s) within the areas targeted by this assay, and inadequate number of viral copies(<138 copies/mL). A negative result must be combined with clinical observations, patient history, and epidemiological information. The expected result is Negative.  Fact Sheet for Patients:  BloggerCourse.com  Fact Sheet for Healthcare Providers:  SeriousBroker.it  This test is no t yet approved or cleared by the [place]  FDA and  has been authorized for detection and/or diagnosis of SARS-CoV-2 by FDA under an Emergency Use Authorization (EUA). This EUA will remain  in effect (  meaning this test can be used) for the duration of the COVID-19 declaration under Section 564(b)(1) of the Act, 21 U.S.C.section  360bbb-3(b)(1), unless the authorization is terminated  or revoked sooner.       Influenza A by PCR NEGATIVE NEGATIVE Final   Influenza B by PCR NEGATIVE NEGATIVE Final    Comment: (NOTE) The Xpert Xpress SARS-CoV-2/FLU/RSV plus assay is intended as an aid in the diagnosis of influenza from Nasopharyngeal swab specimens and should not be used as a sole basis for treatment. Nasal washings and aspirates are unacceptable for Xpert Xpress SARS-CoV-2/FLU/RSV testing.  Fact Sheet for Patients: BloggerCourse.com  Fact Sheet for Healthcare Providers: SeriousBroker.it  This test is not yet approved or cleared by the [place]  FDA and has been authorized for detection and/or diagnosis of SARS-CoV-2 by FDA under an Emergency Use Authorization (EUA). This EUA will remain in effect (meaning this test can be used) for the duration of the COVID-19 declaration under Section 564(b)(1) of the Act, 21 U.S.C. section 360bbb-3(b)(1), unless the authorization is terminated or revoked.     Resp Syncytial Virus by PCR NEGATIVE NEGATIVE Final    Comment: (NOTE) Fact Sheet for Patients: BloggerCourse.com  Fact Sheet for Healthcare Providers: SeriousBroker.it  This test is not yet approved or cleared by the [place]  FDA and has been authorized for detection and/or diagnosis of SARS-CoV-2 by FDA under an Emergency Use Authorization (EUA). This EUA will remain in effect (meaning this test can be used) for the duration of the COVID-19 declaration under Section 564(b)(1) of the Act, 21 U.S.C. section 360bbb-3(b)(1), unless the authorization is terminated or revoked.  Performed at [facility], [address] [place], [place] [address]   Respiratory (~20 pathogens) panel by PCR     Status: None   Collection Time: 06/03/24  9:37 AM   Specimen: Nasopharyngeal Swab; Respiratory   Result Value Ref Range Status   Adenovirus NOT DETECTED NOT DETECTED Final   Coronavirus 229E NOT DETECTED NOT DETECTED Final    Comment: (NOTE) The Coronavirus on the Respiratory Panel, DOES NOT test for the novel  Coronavirus (2019 nCoV)    Coronavirus HKU1 NOT DETECTED NOT DETECTED Final   Coronavirus NL63 NOT DETECTED NOT DETECTED Final   Coronavirus OC43 NOT DETECTED NOT DETECTED Final   Metapneumovirus NOT DETECTED NOT DETECTED Final   Rhinovirus / Enterovirus NOT DETECTED NOT DETECTED Final   Influenza A NOT DETECTED NOT DETECTED Final   Influenza B NOT DETECTED NOT DETECTED Final   Parainfluenza Virus 1 NOT DETECTED NOT DETECTED Final   Parainfluenza Virus 2 NOT DETECTED NOT DETECTED Final   Parainfluenza Virus 3 NOT DETECTED NOT DETECTED Final   Parainfluenza Virus 4 NOT DETECTED NOT DETECTED Final   Respiratory Syncytial Virus NOT DETECTED NOT DETECTED Final   Bordetella pertussis NOT DETECTED NOT DETECTED Final   Bordetella Parapertussis NOT DETECTED NOT DETECTED Final   Chlamydophila pneumoniae NOT DETECTED NOT DETECTED Final   Mycoplasma pneumoniae NOT DETECTED NOT DETECTED Final    Comment: Performed at [facility], [address], [place], [place] [address]  Blood Culture (routine x 2)     Status: None (Preliminary result)   Collection Time: 06/03/24 10:45 AM   Specimen: BLOOD  Result Value Ref Range Status   Specimen Description BLOOD LEFT ANTECUBITAL  Final   Special Requests   Final    BOTTLES DRAWN AEROBIC AND ANAEROBIC Blood Culture adequate volume   Culture   Final  NO GROWTH 3 DAYS Performed at [facility], [address] [place], [place] [address]    Report Status PENDING  Incomplete  Blood Culture (routine x 2)     Status: None (Preliminary result)   Collection Time: 06/03/24 10:45 AM   Specimen: BLOOD  Result Value Ref Range Status   Specimen Description BLOOD LEFT ARM  Final   Special Requests   Final    BOTTLES DRAWN AEROBIC  AND ANAEROBIC Blood Culture results may not be optimal due to an inadequate volume of blood received in culture bottles   Culture   Final    NO GROWTH 3 DAYS Performed at [facility], [address], [place], [place] [address]    Report Status PENDING  Incomplete  MRSA Next Gen by PCR, Nasal     Status: None   Collection Time: 06/03/24  1:55 PM  Result Value Ref Range Status   MRSA by PCR Next Gen NOT DETECTED NOT DETECTED Final    Comment: (NOTE) The GeneXpert MRSA Assay (FDA approved for NASAL specimens only), is one component of a comprehensive MRSA colonization surveillance program. It is not intended to diagnose MRSA infection nor to guide or monitor treatment for MRSA infections. Test performance is not FDA approved in patients less than 94 years old. Performed at [facility], [address] [place], [place] [address]     Procedures and diagnostic studies:  No results found.              LOS: 2 days   Verdie Wilms  [facility]   Pager on [email or web address]. If 7PM-7AM, please contact night-coverage at [email or web address]     06/06/2024, 3:13 PM

## 2024-06-06 NOTE — Plan of Care (Signed)

## 2024-06-06 NOTE — Progress Notes (Signed)
 Physical Therapy Treatment Patient Details Name: [name]. MRN: [id number] DOB: Jul 20, 1932 Today's Date: 06/06/2024   History of Present Illness [name]. is a 89 y.o. male with recent past medical history of flexor tenosynovitis on the right hand that required surgery on 05/24/2024, discharged ~1 week later and retuned on the same day due to hypertension as well as inability of family to care for him at home.  Family reports they have been unable to get patient out of bed without significant assistance and he has been unable to ambulate since he has been home. Patient did have an episode of high blood pressure at home with systolics in the 240s however patient's systolic is 144 on reassessment during my evaluation.  Patient was supposed to be on Norvasc  however there is question as to whether he took it today but for being discharged  ROS: Patient currently denies any vision changes, tinnitus, difficulty speaking, facial droop, sore throat, chest pain, shortness of breath, abdominal pain, nausea/vomiting/diarrhea, dysuria, or weakness/numbness/paresthesias in any extremity.    PT Comments  Pt in recliner on arrival, agreeable to PT session, remains very weak in general, modA to rise to standing with a push off strategy but with a pull to stand strategy is able to rise nearly 10x from elevated recliner. Pt assisted with sidestepping exercise along bedside, generally good tolerance with this, low fatigue. Pt set up in chair at end of session, all needs met, wife at bedside. Will continue to follow.     If plan is discharge home, recommend the following: A little help with walking and/or transfers;A little help with bathing/dressing/bathroom;Assistance with cooking/housework;Direct supervision/assist for medications management;Supervision due to cognitive status;Assist for transportation;Help with stairs or ramp for entrance   Can travel by private vehicle     Yes  Equipment  Recommendations  Rolling walker (2 wheels)    Recommendations for Other Services       Precautions / Restrictions Precautions Precautions: Fall Restrictions RUE Weight Bearing Per Provider Order: Weight bearing as tolerated Other Position/Activity Restrictions: WBAT to Rt hand per Dr. Tobie     Mobility  Bed Mobility                    Transfers Overall transfer level: Needs assistance Equipment used: Rolling walker (2 wheels) Transfers: Sit to/from Stand Sit to Stand: Mod assist                Ambulation/Gait Ambulation/Gait assistance:  (see exercises section)                 Stairs             Wheelchair Mobility     Tilt Bed    Modified Rankin (Stroke Patients Only)       Balance                                            Communication Communication Communication: Impaired Factors Affecting Communication: Hearing impaired  Cognition                                        Cueing    Exercises Other Exercises Other Exercises: seated marching in place 6x3secH bilat (minA to RLE for full range of a stiff  hip) Other Exercises: seated LAQ 6x3secH bilat Other Exercises: STS from recliner + 2 pillow lift: 1x6, then 1x4 (using anterior pull to standing strategy) Other Exercises: lateral side stepping along EOB Bennett bed rail support 6x Rt and Left    General Comments General comments (skin integrity, edema, etc.): sutures removed at bedside this time by MD once pt seated EOB; urinated in floor and had BM during session-notified nurse of need for new purewick on OT exit      Pertinent Vitals/Pain Pain Assessment Pain Assessment: No/denies pain    Home Living                          Prior Function            PT Goals (current goals can now be found in the care plan section) Acute Rehab PT Goals Patient Stated Goal: Improved balance PT Goal Formulation: With patient/family Time  For Goal Achievement: 06/14/24 Potential to Achieve Goals: Fair Progress towards PT goals: Progressing toward goals    Frequency    Min 2X/week      PT Plan      Co-evaluation              AM-PAC PT 6 Clicks Mobility   Outcome Measure  Help needed turning from your back to your side while in a flat bed without using bedrails?: A Lot Help needed moving from lying on your back to sitting on the side of a flat bed without using bedrails?: A Lot Help needed moving to and from a bed to a chair (including a wheelchair)?: A Little Help needed standing up from a chair using your arms (e.g., wheelchair or bedside chair)?: A Little Help needed to walk in hospital room?: A Little Help needed climbing 3-5 steps with a railing? : A Lot 6 Click Score: 15    End of Session   Activity Tolerance: Patient tolerated treatment well;Patient limited by fatigue Patient left: in chair;with call bell/phone within reach;with family/visitor present;with chair alarm set Nurse Communication: Mobility status PT Visit Diagnosis: History of falling (Z91.81);Muscle weakness (generalized) (M62.81);Difficulty in walking, not elsewhere classified (R26.2)     Time: 8569-8540 PT Time Calculation (min) (ACUTE ONLY): 29 min  Charges:    $Therapeutic Exercise: 23-37 mins PT General Charges $$ ACUTE PT VISIT: 1 Visit                    3:14 PM, 06/06/24 [name], PT, DPT Physical Therapist - [facility]  [phone] (ASCOM)    [name] 06/06/2024, 3:10 PM

## 2024-06-06 NOTE — Progress Notes (Signed)
 Right hand incision and drainage wound healing well.  Sutures removed today with no complication.  Steri-Strips applied with no complication.  No drainage.  Continue follow-up with orthopedics.

## 2024-06-07 DIAGNOSIS — J189 Pneumonia, unspecified organism: Secondary | ICD-10-CM | POA: Diagnosis not present

## 2024-06-07 LAB — BASIC METABOLIC PANEL WITH GFR
Anion gap: 7 (ref 5–15)
BUN: 19 mg/dL (ref 8–23)
CO2: 22 mmol/L (ref 22–32)
Calcium: 8.5 mg/dL — ABNORMAL LOW (ref 8.9–10.3)
Chloride: 105 mmol/L (ref 98–111)
Creatinine, Ser: 0.82 mg/dL (ref 0.61–1.24)
GFR, Estimated: >60 mL/min (ref >60)
Glucose, Bld: 95 mg/dL (ref 70–99)
Potassium: 4 mmol/L (ref 3.5–5.1)
Sodium: 134 mmol/L — ABNORMAL LOW (ref 135–145)

## 2024-06-07 LAB — CBC
HCT: 31 % — ABNORMAL LOW (ref 39.0–52.0)
Hemoglobin: 10.9 g/dL — ABNORMAL LOW (ref 13.0–17.0)
MCH: 34.3 pg — ABNORMAL HIGH (ref 26.0–34.0)
MCHC: 35.2 g/dL (ref 30.0–36.0)
MCV: 97.5 fL (ref 80.0–100.0)
Platelets: 528 K/uL — ABNORMAL HIGH (ref 150–400)
RBC: 3.18 MIL/uL — ABNORMAL LOW (ref 4.22–5.81)
RDW: 13.2 % (ref 11.5–15.5)
WBC: 9.5 K/uL (ref 4.0–10.5)
nRBC: 0 % (ref 0.0–0.2)

## 2024-06-07 NOTE — Progress Notes (Addendum)
 Progress Note    [name].  FMW:[id number] DOB: 02-21-1932  DOA: 05/31/2024 PCP: [name], MD      Brief Narrative:    Medical records reviewed and are as summarized below:  [name]. is a 89 y.o. male  with medical history significant of recently diagnosed right hand tenosynovitis, HTN, history of drug-induced autoimmune hemolytic anemia, prostate cancer history of ITP, brought in by family member for evaluation of ambulation difficulties.  He was recently from 05/24/2024 through 05/31/2024 after hospitalization for tenosynovitis of right fifth finger, s/p I&D and discharged home on Augmentin .  His wife states that patient has been very weak and has become increasingly difficult to take care of him at home (since discharge from the hospital).  Reportedly, his blood pressure at home was in the 200s.  BP with EMS was 199/102.  Patient was brought to the ED on 05/31/2024 because of severe hypertension.  Patient had been boarding in the ED.  He was evaluated by a physical therapist who recommended discharge to acute rehab.  While in the ED, he developed a dry cough and low-grade fever on 06/02/2024.  Chest x-ray showed left lower lobe pneumonia.  Hospitalist team was consulted for admission.      Assessment/Plan:   Principal Problem:   CAP (community acquired pneumonia) Active Problems:   Lobar pneumonia (HCC)    Left lower lobe pneumonia: Plan to complete 5 days of IV cefepime  on 06/07/2024  No growth on blood cultures thus far.  Respiratory panel was negative.  SARS-CoV-2, RSV and influenza A and B were negative. Leukocytosis has resolved    General weakness, ambulatory dysfunction: PT recommended discharge to SNF.  Follow-up with TOC to assist with disposition   Recent tenosynovitis of right fingers and right hand: [place], [place], with orthopedic surgery removed right hand sutures on 06/06/2024 patient had been discharged on Augmentin  when he was  discharged from the hospital on 05/31/2024. No additional antibiotics required   Hypertensive urgency: BP improved.  Continue antihypertensives.   Systolic murmur heard loudest in the aortic and mitral area: Obtain 2D echo for further evaluation to rule out aortic stenosis, mitral regurgitation and other valvular abnormalities 2D echo is pending   Hypokalemia: Improved Chronic hyponatremia: Asymptomatic.  He was started on salt tablets on 06/06/2024.  Sodium up from 129-134.  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   Low serum osmolality 274, normal urine sodium 25 and low urine osmolality 268.  TSH was 0.9.  Cortisol was 15.  Of note, patient has had intermittently low sodium dating back to February 10, 2015 when his sodium was 129.    Thrombocytosis: Platelet count is trending upward [phone].  Etiology unclear but this may be reactive from recent infection.  Continue to monitor.    Comorbidities include history of ITP, autoimmune hemolytic anemia, hyperlipidemia   Dr. Janit, insurance medical director, requested a peer to peer review.  Case was discussed with him and he graciously approved authorization for patient to be discharged to SNF.  Follow-up with TOC to assist with disposition. Marinda, SW, updated.      Diet Order             Diet regular Fluid consistency: Thin  Diet effective now                            Consultants: Orthopedic surgeon  Procedures:  None    Medications:    amLODipine   5 mg Oral Daily   cholecalciferol   5,000 Units Oral Daily   citalopram   20 mg Oral Daily   dorzolamide   1 drop Right Eye BID   enoxaparin  (LOVENOX ) injection  40 mg Subcutaneous Q24H   guaiFENesin   1,200 mg Oral BID   latanoprost   1 drop Right Eye QHS   mirabegron  ER  25 mg Oral Daily   pantoprazole   40 mg Oral Daily   pravastatin   20 mg Oral QHS   propranolol   40 mg Oral BID   QUEtiapine   25 mg Oral QHS   sodium  chloride  1 g Oral BID WC   Continuous Infusions:  ceFEPime  (MAXIPIME ) IV 2 g (06/07/24 1057)     Anti-infectives (From admission, onward)    Start     Dose/Rate Route Frequency Ordered Stop   06/03/24 2200  ceFEPIme  (MAXIPIME ) 2 g in sodium chloride  0.9 % 100 mL IVPB        2 g 200 mL/hr over 30 Minutes Intravenous Every 12 hours 06/03/24 1054     06/03/24 0945  vancomycin  (VANCOCIN ) IVPB 1000 mg/200 mL premix        1,000 mg 200 mL/hr over 60 Minutes Intravenous  Once 06/03/24 0937 06/03/24 1216   06/03/24 0945  ceFEPIme  (MAXIPIME ) 2 g in sodium chloride  0.9 % 100 mL IVPB        2 g 200 mL/hr over 30 Minutes Intravenous  Once 06/03/24 0937 06/03/24 1012   05/31/24 2345  amoxicillin -clavulanate (AUGMENTIN ) 875-125 MG per tablet 1 tablet  Status:  Discontinued        1 tablet Oral Every 12 hours 05/31/24 2337 06/03/24 1054              Family Communication/Anticipated D/C date and plan/Code Status   DVT prophylaxis: enoxaparin  (LOVENOX ) injection 40 mg Start: 06/03/24 2200     Code Status: Full Code  Family Communication: None Disposition Plan: Plan to discharge to SNF   Status is: Inpatient Remains inpatient appropriate because: Awaiting placement to SNF for general weakness        Subjective:   Interval events noted.  He has no complaints.  Male family member at the bedside.  Damien, RN, was at the bedside  Objective:    Vitals:   06/06/24 1543 06/06/24 1943 06/07/24 0249 06/07/24 0822  BP: (!) 101/58 118/61 122/69 (!) 149/89  Pulse: 66 71 80 88  Resp: 18 18 19 17   Temp: 98.2 F (36.8 C) 98.7 F (37.1 C) 98.5 F (36.9 C) 97.8 F (36.6 C)  TempSrc: Oral Oral    SpO2: 99% 99% 96% 100%   No data found.   Intake/Output Summary (Last 24 hours) at 06/07/2024 1216 Last data filed at 06/07/2024 1200 Gross per 24 hour  Intake 0 ml  Output 1900 ml  Net -1900 ml   There were no vitals filed for this visit.  Exam:  GEN: NAD SKIN: Warm and  dry EYES: No pallor or icterus ENT: MMM CV: RRR PULM: Mild right basilar rales.  No wheezing or rhonchi heard ABD: soft, ND, NT, +BS CNS: AAO x 3, non focal EXT: No edema or tenderness    Data Reviewed:   I have personally reviewed following labs and imaging studies:  Labs: Labs show the following:   Basic Metabolic Panel: Recent Labs  Lab 06/01/24 0121 06/03/24 0909 06/05/24 0426 06/06/24 0426 06/07/24 0523  NA 131* 132* 133* 129*  134*  K 3.5 3.7 3.4* 4.1 4.0  CL 101 101 104 99 105  CO2 22 23 22  21* 22  GLUCOSE 107* 100* 87 92 95  BUN 19 15 17 18 19   CREATININE 0.67 0.58* 0.71 0.63 0.82  CALCIUM 8.3* 8.6* 8.6* 8.4* 8.5*  MG  --   --   --  1.9  --   PHOS  --   --  2.9  --   --    GFR CrCl cannot be calculated (Unknown ideal weight.). Liver Function Tests: Recent Labs  Lab 06/01/24 0121  AST 27  ALT 24  ALKPHOS 95  BILITOT 0.8  PROT 6.9  ALBUMIN 2.7*   No results for input(s): LIPASE, AMYLASE in the last 168 hours. No results for input(s): AMMONIA in the last 168 hours. Coagulation profile No results for input(s): INR, PROTIME in the last 168 hours.  CBC: Recent Labs  Lab 06/01/24 0121 06/03/24 0909 06/05/24 0426 06/06/24 0426 06/07/24 0523  WBC 10.9* 11.9* 11.5* 15.3* 9.5  NEUTROABS 7.8*  --   --   --   --   HGB 12.0* 11.9* 10.8* 10.8* 10.9*  HCT 34.3* 34.3* 30.8* 30.9* 31.0*  MCV 97.4 97.7 97.8 97.2 97.5  PLT 331 394 442* 482* 528*   Cardiac Enzymes: No results for input(s): CKTOTAL, CKMB, CKMBINDEX, TROPONINI in the last 168 hours. BNP (last 3 results) No results for input(s): PROBNP in the last 8760 hours. CBG: No results for input(s): GLUCAP in the last 168 hours. D-Dimer: No results for input(s): DDIMER in the last 72 hours. Hgb A1c: No results for input(s): HGBA1C in the last 72 hours. Lipid Profile: No results for input(s): CHOL, HDL, LDLCALC, TRIG, CHOLHDL, LDLDIRECT in the last 72  hours. Thyroid  function studies: Recent Labs    06/06/24 0426  TSH 0.900   Anemia work up: No results for input(s): VITAMINB12, FOLATE, FERRITIN, TIBC, IRON, RETICCTPCT in the last 72 hours. Sepsis Labs: Recent Labs  Lab 06/03/24 0909 06/03/24 0937 06/03/24 1505 06/05/24 0426 06/06/24 0426 06/07/24 0523  PROCALCITON <0.10  --   --   --   --   --   WBC 11.9*  --   --  11.5* 15.3* 9.5  LATICACIDVEN  --  0.8 2.4* 0.7  --   --     Microbiology Recent Results (from the past 240 hours)  Resp panel by RT-PCR (RSV, Flu A&B, Covid) Nasopharyngeal Swab     Status: None   Collection Time: 06/03/24  9:37 AM   Specimen: Nasopharyngeal Swab; Nasal Swab  Result Value Ref Range Status   SARS Coronavirus 2 by RT PCR NEGATIVE NEGATIVE Final    Comment: (NOTE) SARS-CoV-2 target nucleic acids are NOT DETECTED.  The SARS-CoV-2 RNA is generally detectable in upper respiratory specimens during the acute phase of infection. The lowest concentration of SARS-CoV-2 viral copies this assay can detect is 138 copies/mL. A negative result does not preclude SARS-Cov-2 infection and should not be used as the sole basis for treatment or other patient management decisions. A negative result may occur with  improper specimen collection/handling, submission of specimen other than nasopharyngeal swab, presence of viral mutation(s) within the areas targeted by this assay, and inadequate number of viral copies(<138 copies/mL). A negative result must be combined with clinical observations, patient history, and epidemiological information. The expected result is Negative.  Fact Sheet for Patients:  BloggerCourse.com  Fact Sheet for Healthcare Providers:  SeriousBroker.it  This test is no t yet  approved or cleared by the [place]  FDA and  has been authorized for detection and/or diagnosis of SARS-CoV-2 by FDA under an Emergency Use  Authorization (EUA). This EUA will remain  in effect (meaning this test can be used) for the duration of the COVID-19 declaration under Section 564(b)(1) of the Act, 21 U.S.C.section 360bbb-3(b)(1), unless the authorization is terminated  or revoked sooner.       Influenza A by PCR NEGATIVE NEGATIVE Final   Influenza B by PCR NEGATIVE NEGATIVE Final    Comment: (NOTE) The Xpert Xpress SARS-CoV-2/FLU/RSV plus assay is intended as an aid in the diagnosis of influenza from Nasopharyngeal swab specimens and should not be used as a sole basis for treatment. Nasal washings and aspirates are unacceptable for Xpert Xpress SARS-CoV-2/FLU/RSV testing.  Fact Sheet for Patients: BloggerCourse.com  Fact Sheet for Healthcare Providers: SeriousBroker.it  This test is not yet approved or cleared by the [place]  FDA and has been authorized for detection and/or diagnosis of SARS-CoV-2 by FDA under an Emergency Use Authorization (EUA). This EUA will remain in effect (meaning this test can be used) for the duration of the COVID-19 declaration under Section 564(b)(1) of the Act, 21 U.S.C. section 360bbb-3(b)(1), unless the authorization is terminated or revoked.     Resp Syncytial Virus by PCR NEGATIVE NEGATIVE Final    Comment: (NOTE) Fact Sheet for Patients: BloggerCourse.com  Fact Sheet for Healthcare Providers: SeriousBroker.it  This test is not yet approved or cleared by the [place]  FDA and has been authorized for detection and/or diagnosis of SARS-CoV-2 by FDA under an Emergency Use Authorization (EUA). This EUA will remain in effect (meaning this test can be used) for the duration of the COVID-19 declaration under Section 564(b)(1) of the Act, 21 U.S.C. section 360bbb-3(b)(1), unless the authorization is terminated or revoked.  Performed at [facility], [address] [place], [place] [address]   Respiratory (~20 pathogens) panel by PCR     Status: None   Collection Time: 06/03/24  9:37 AM   Specimen: Nasopharyngeal Swab; Respiratory  Result Value Ref Range Status   Adenovirus NOT DETECTED NOT DETECTED Final   Coronavirus 229E NOT DETECTED NOT DETECTED Final    Comment: (NOTE) The Coronavirus on the Respiratory Panel, DOES NOT test for the novel  Coronavirus (2019 nCoV)    Coronavirus HKU1 NOT DETECTED NOT DETECTED Final   Coronavirus NL63 NOT DETECTED NOT DETECTED Final   Coronavirus OC43 NOT DETECTED NOT DETECTED Final   Metapneumovirus NOT DETECTED NOT DETECTED Final   Rhinovirus / Enterovirus NOT DETECTED NOT DETECTED Final   Influenza A NOT DETECTED NOT DETECTED Final   Influenza B NOT DETECTED NOT DETECTED Final   Parainfluenza Virus 1 NOT DETECTED NOT DETECTED Final   Parainfluenza Virus 2 NOT DETECTED NOT DETECTED Final   Parainfluenza Virus 3 NOT DETECTED NOT DETECTED Final   Parainfluenza Virus 4 NOT DETECTED NOT DETECTED Final   Respiratory Syncytial Virus NOT DETECTED NOT DETECTED Final   Bordetella pertussis NOT DETECTED NOT DETECTED Final   Bordetella Parapertussis NOT DETECTED NOT DETECTED Final   Chlamydophila pneumoniae NOT DETECTED NOT DETECTED Final   Mycoplasma pneumoniae NOT DETECTED NOT DETECTED Final    Comment: Performed at [facility], [address], [place], [place] [address]  Blood Culture (routine x 2)     Status: None (Preliminary result)   Collection Time: 06/03/24 10:45 AM   Specimen: BLOOD  Result Value Ref Range Status   Specimen  Description BLOOD LEFT ANTECUBITAL  Final   Special Requests   Final    BOTTLES DRAWN AEROBIC AND ANAEROBIC Blood Culture adequate volume   Culture   Final    NO GROWTH 4 DAYS Performed at [facility], [address] [place], [place] [address]    Report Status PENDING  Incomplete  Blood Culture (routine x 2)     Status: None (Preliminary result)    Collection Time: 06/03/24 10:45 AM   Specimen: BLOOD  Result Value Ref Range Status   Specimen Description BLOOD LEFT ARM  Final   Special Requests   Final    BOTTLES DRAWN AEROBIC AND ANAEROBIC Blood Culture results may not be optimal due to an inadequate volume of blood received in culture bottles   Culture   Final    NO GROWTH 4 DAYS Performed at [facility], [address], [place], [place] [address]    Report Status PENDING  Incomplete  MRSA Next Gen by PCR, Nasal     Status: None   Collection Time: 06/03/24  1:55 PM  Result Value Ref Range Status   MRSA by PCR Next Gen NOT DETECTED NOT DETECTED Final    Comment: (NOTE) The GeneXpert MRSA Assay (FDA approved for NASAL specimens only), is one component of a comprehensive MRSA colonization surveillance program. It is not intended to diagnose MRSA infection nor to guide or monitor treatment for MRSA infections. Test performance is not FDA approved in patients less than 67 years old. Performed at [facility], [address] [place], [place] [address]     Procedures and diagnostic studies:  No results found.              LOS: 3 days   Hazel Wrinkle  [facility]   Pager on [email or web address]. If 7PM-7AM, please contact night-coverage at [email or web address]     06/07/2024, 12:16 PM

## 2024-06-07 NOTE — Plan of Care (Signed)

## 2024-06-07 NOTE — TOC Progression Note (Addendum)
 Transition of Care (TOC) - Progression Note    Patient Details  Name: [name]. MRN: [id number] Date of Birth: 1932/06/25  Transition of Care [facility]) CM/SW Contact  Marinda Cooks, RN Phone Number: 06/07/2024, 2:10 PM  Clinical Narrative:    This CM received call from pt's ins [employer] RN that  the Medical Director Garnette Sar [phone] ins requesting a Peer 2 Peer no later than 08:30 tomm for [facility] Admission Covering MD made aware and provided above info . Also informed pt's ambulance request has been approved and the request # is [id number]. TOC will cont to follow dc planning / care coordination and update as applicable.   14:05- CM updated  by covering MD pt's peer to peer completed and approved for pt's bed offer at [facility], spoke with admission liaison who informed pt can admit Mon. TOC will cont to follow dc planning/ care coordination and update as applicable .   Expected Discharge Plan: Skilled Nursing Facility Barriers to Discharge: Continued Medical Work up  Expected Discharge Plan and Services    TBD   Social Determinants of Health (SDOH) Interventions SDOH Screenings   Food Insecurity: No Food Insecurity (05/24/2024)  Housing: Low Risk  (05/24/2024)  Transportation Needs: No Transportation Needs (05/24/2024)  Utilities: Not At Risk (05/24/2024)  Financial Resource Strain: Patient Declined (11/06/2023)   Received from [facility]  Social Connections: Moderately Integrated (05/24/2024)  Tobacco Use: Low Risk  (05/31/2024)    Readmission Risk Interventions     No data to display

## 2024-06-08 ENCOUNTER — Inpatient Hospital Stay
Admit: 2024-06-08 | Discharge: 2024-06-08 | Disposition: A | Discharge status: Home / Self Care | Attending: Internal Medicine | Admitting: Internal Medicine

## 2024-06-08 DIAGNOSIS — J189 Pneumonia, unspecified organism: Secondary | ICD-10-CM | POA: Diagnosis not present

## 2024-06-08 LAB — CBC
HCT: 32 % — ABNORMAL LOW (ref 39.0–52.0)
Hemoglobin: 11.3 g/dL — ABNORMAL LOW (ref 13.0–17.0)
MCH: 34 pg (ref 26.0–34.0)
MCHC: 35.3 g/dL (ref 30.0–36.0)
MCV: 96.4 fL (ref 80.0–100.0)
Platelets: 554 K/uL — ABNORMAL HIGH (ref 150–400)
RBC: 3.32 MIL/uL — ABNORMAL LOW (ref 4.22–5.81)
RDW: 13 % (ref 11.5–15.5)
WBC: 13.2 K/uL — ABNORMAL HIGH (ref 4.0–10.5)
nRBC: 0 % (ref 0.0–0.2)

## 2024-06-08 LAB — BASIC METABOLIC PANEL WITH GFR
Anion gap: 7 (ref 5–15)
BUN: 17 mg/dL (ref 8–23)
CO2: 23 mmol/L (ref 22–32)
Calcium: 8.2 mg/dL — ABNORMAL LOW (ref 8.9–10.3)
Chloride: 102 mmol/L (ref 98–111)
Creatinine, Ser: 0.66 mg/dL (ref 0.61–1.24)
GFR, Estimated: >60 mL/min (ref >60)
Glucose, Bld: 96 mg/dL (ref 70–99)
Potassium: 3.7 mmol/L (ref 3.5–5.1)
Sodium: 132 mmol/L — ABNORMAL LOW (ref 135–145)

## 2024-06-08 LAB — CULTURE, BLOOD (ROUTINE X 2)
Culture: NO GROWTH
Culture: NO GROWTH
Special Requests: ADEQUATE

## 2024-06-08 LAB — ECHOCARDIOGRAM COMPLETE
AR max vel: 1.85 cm2
AV Area VTI: 2.02 cm2
AV Area mean vel: 1.79 cm2
AV Mean grad: 16 mmHg
AV Peak grad: 27.2 mmHg
Ao pk vel: 2.61 m/s
Area-P 1/2: 3.65 cm2
Calc EF: 64.3 %
S' Lateral: 2.9 cm
Single Plane A2C EF: 67.6 %
Single Plane A4C EF: 58.3 %

## 2024-06-08 MED ORDER — BISACODYL 10 MG RE SUPP
10.0000 mg | Freq: Once | RECTAL | Status: AC
  Administered 2024-06-08: 10 mg via RECTAL
  Filled 2024-06-08: qty 1

## 2024-06-08 MED ORDER — SENNOSIDES-DOCUSATE SODIUM 8.6-50 MG PO TABS: 1.0000 | ORAL_TABLET | Freq: Every evening | ORAL | Status: DC | PRN

## 2024-06-08 NOTE — Progress Notes (Signed)
 Progress Note    [name].  FMW:[id number] DOB: 08/30/1932  DOA: 05/31/2024 PCP: [name], MD      Brief Narrative:    Medical records reviewed and are as summarized below:  [name]. is a 89 y.o. male  with medical history significant of recently diagnosed right hand tenosynovitis, HTN, history of drug-induced autoimmune hemolytic anemia, prostate cancer history of ITP, brought in by family member for evaluation of ambulation difficulties.  He was recently from 05/24/2024 through 05/31/2024 after hospitalization for tenosynovitis of right fifth finger, s/p I&D and discharged home on Augmentin .  His wife states that patient has been very weak and has become increasingly difficult to take care of him at home (since discharge from the hospital).  Reportedly, his blood pressure at home was in the 200s.  BP with EMS was 199/102.  Patient was brought to the ED on 05/31/2024 because of severe hypertension.  Patient had been boarding in the ED.  He was evaluated by a physical therapist who recommended discharge to acute rehab.  While in the ED, he developed a dry cough and low-grade fever on 06/02/2024.  Chest x-ray showed left lower lobe pneumonia.  Hospitalist team was consulted for admission.      Assessment/Plan:   Principal Problem:   CAP (community acquired pneumonia) Active Problems:   Lobar pneumonia (HCC)    Left lower lobe pneumonia: Plan to complete 5 days of IV cefepime  on 06/07/2024  No growth on blood cultures thus far.  Respiratory panel was negative.  SARS-CoV-2, RSV and influenza A and B were negative. Leukocytosis has resolved    General weakness, ambulatory dysfunction: PT recommended discharge to SNF.  Follow-up with TOC to assist with disposition   Recent tenosynovitis of right fingers and right hand: [place], [place], with orthopedic surgery removed right hand sutures on 06/06/2024 patient had been discharged on Augmentin  when he was  discharged from the hospital on 05/31/2024. No additional antibiotics required   Hypertensive urgency: BP improved.  Continue antihypertensives.   Systolic murmur heard loudest in the aortic and mitral area: Obtain 2D echo for further evaluation to rule out aortic stenosis, mitral regurgitation and other valvular abnormalities 2D echo is pending   Hypokalemia: Improved Chronic hyponatremia: Asymptomatic.  He was started on salt tablets on 06/06/2024.  Sodium trend 129-134-132.  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   Low serum osmolality 274, normal urine sodium 25 and low urine osmolality 268.  TSH was 0.9.  Cortisol was 15.  Of note, patient has had intermittently low sodium dating back to February 10, 2015 when his sodium was 129.    Thrombocytosis: Platelet count is trending upward [phone].  Etiology unclear but this may be reactive from recent infection.  Case discussed with Dr. Rennie, his hematologist, via secure chat.  He agrees this may be reactive from recent infection.  Continue to monitor.    Comorbidities include history of ITP, autoimmune hemolytic anemia, hyperlipidemia      Diet Order             Diet regular Fluid consistency: Thin  Diet effective now                            Consultants: Orthopedic surgeon  Procedures: None    Medications:    amLODipine   5 mg Oral Daily   bisacodyl   10 mg Rectal Once  cholecalciferol   5,000 Units Oral Daily   citalopram   20 mg Oral Daily   dorzolamide   1 drop Right Eye BID   enoxaparin  (LOVENOX ) injection  40 mg Subcutaneous Q24H   guaiFENesin   1,200 mg Oral BID   latanoprost   1 drop Right Eye QHS   mirabegron  ER  25 mg Oral Daily   pantoprazole   40 mg Oral Daily   pravastatin   20 mg Oral QHS   propranolol   40 mg Oral BID   QUEtiapine   25 mg Oral QHS   sodium chloride   1 g Oral BID WC   Continuous Infusions:     Anti-infectives (From  admission, onward)    Start     Dose/Rate Route Frequency Ordered Stop   06/03/24 2200  ceFEPIme  (MAXIPIME ) 2 g in sodium chloride  0.9 % 100 mL IVPB        2 g 200 mL/hr over 30 Minutes Intravenous Every 12 hours 06/03/24 1054 06/07/24 2140   06/03/24 0945  vancomycin  (VANCOCIN ) IVPB 1000 mg/200 mL premix        1,000 mg 200 mL/hr over 60 Minutes Intravenous  Once 06/03/24 0937 06/03/24 1216   06/03/24 0945  ceFEPIme  (MAXIPIME ) 2 g in sodium chloride  0.9 % 100 mL IVPB        2 g 200 mL/hr over 30 Minutes Intravenous  Once 06/03/24 0937 06/03/24 1012   05/31/24 2345  amoxicillin -clavulanate (AUGMENTIN ) 875-125 MG per tablet 1 tablet  Status:  Discontinued        1 tablet Oral Every 12 hours 05/31/24 2337 06/03/24 1054              Family Communication/Anticipated D/C date and plan/Code Status   DVT prophylaxis: enoxaparin  (LOVENOX ) injection 40 mg Start: 06/03/24 2200     Code Status: Full Code  Family Communication: Plan discussed with his wife and sister at the bedside Disposition Plan: Plan to discharge to SNF   Status is: Inpatient Remains inpatient appropriate because: Awaiting placement to SNF for general weakness        Subjective:   Interval events noted.  Patient has no complaints.  He feels fine.  His wife was at the bedside.  Ann, patient's sister was also at the bedside.  Objective:    Vitals:   06/07/24 1723 06/07/24 2057 06/08/24 0427 06/08/24 0755  BP: 120/72 (!) 146/74 (!) 168/81 (!) 148/68  Pulse: 76 83 79 77  Resp: 17 16 16 16   Temp: 98.1 F (36.7 C) 99.1 F (37.3 C) 98.3 F (36.8 C) 98.4 F (36.9 C)  TempSrc:      SpO2: 99% 100% 93% 96%   No data found.   Intake/Output Summary (Last 24 hours) at 06/08/2024 1355 Last data filed at 06/08/2024 0900 Gross per 24 hour  Intake 240 ml  Output 600 ml  Net -360 ml   There were no vitals filed for this visit.  Exam:   GEN: NAD SKIN: Warm and dry EYES: No pallor or icterus ENT:  MMM CV: RRR PULM: Mild rales heard at the right lung base.  No rhonchi or wheezing ABD: soft, ND, NT, +BS CNS: AAO x 3, non focal EXT: No edema or tenderness     Data Reviewed:   I have personally reviewed following labs and imaging studies:  Labs: Labs show the following:   Basic Metabolic Panel: Recent Labs  Lab 06/03/24 0909 06/05/24 0426 06/06/24 0426 06/07/24 0523 06/08/24 0453  NA 132* 133* 129* 134* 132*  K  3.7 3.4* 4.1 4.0 3.7  CL 101 104 99 105 102  CO2 23 22 21* 22 23  GLUCOSE 100* 87 92 95 96  BUN 15 17 18 19 17   CREATININE 0.58* 0.71 0.63 0.82 0.66  CALCIUM 8.6* 8.6* 8.4* 8.5* 8.2*  MG  --   --  1.9  --   --   PHOS  --  2.9  --   --   --    GFR CrCl cannot be calculated (Unknown ideal weight.). Liver Function Tests: No results for input(s): AST, ALT, ALKPHOS, BILITOT, PROT, ALBUMIN in the last 168 hours.  No results for input(s): LIPASE, AMYLASE in the last 168 hours. No results for input(s): AMMONIA in the last 168 hours. Coagulation profile No results for input(s): INR, PROTIME in the last 168 hours.  CBC: Recent Labs  Lab 06/03/24 0909 06/05/24 0426 06/06/24 0426 06/07/24 0523 06/08/24 0453  WBC 11.9* 11.5* 15.3* 9.5 13.2*  HGB 11.9* 10.8* 10.8* 10.9* 11.3*  HCT 34.3* 30.8* 30.9* 31.0* 32.0*  MCV 97.7 97.8 97.2 97.5 96.4  PLT 394 442* 482* 528* 554*   Cardiac Enzymes: No results for input(s): CKTOTAL, CKMB, CKMBINDEX, TROPONINI in the last 168 hours. BNP (last 3 results) No results for input(s): PROBNP in the last 8760 hours. CBG: No results for input(s): GLUCAP in the last 168 hours. D-Dimer: No results for input(s): DDIMER in the last 72 hours. Hgb A1c: No results for input(s): HGBA1C in the last 72 hours. Lipid Profile: No results for input(s): CHOL, HDL, LDLCALC, TRIG, CHOLHDL, LDLDIRECT in the last 72 hours. Thyroid  function studies: Recent Labs    06/06/24 0426  TSH 0.900    Anemia work up: No results for input(s): VITAMINB12, FOLATE, FERRITIN, TIBC, IRON, RETICCTPCT in the last 72 hours. Sepsis Labs: Recent Labs  Lab 06/03/24 0909 06/03/24 9062 06/03/24 1505 06/05/24 0426 06/06/24 0426 06/07/24 0523 06/08/24 0453  PROCALCITON <0.10  --   --   --   --   --   --   WBC 11.9*  --   --  11.5* 15.3* 9.5 13.2*  LATICACIDVEN  --  0.8 2.4* 0.7  --   --   --     Microbiology Recent Results (from the past 240 hours)  Resp panel by RT-PCR (RSV, Flu A&B, Covid) Nasopharyngeal Swab     Status: None   Collection Time: 06/03/24  9:37 AM   Specimen: Nasopharyngeal Swab; Nasal Swab  Result Value Ref Range Status   SARS Coronavirus 2 by RT PCR NEGATIVE NEGATIVE Final    Comment: (NOTE) SARS-CoV-2 target nucleic acids are NOT DETECTED.  The SARS-CoV-2 RNA is generally detectable in upper respiratory specimens during the acute phase of infection. The lowest concentration of SARS-CoV-2 viral copies this assay can detect is 138 copies/mL. A negative result does not preclude SARS-Cov-2 infection and should not be used as the sole basis for treatment or other patient management decisions. A negative result may occur with  improper specimen collection/handling, submission of specimen other than nasopharyngeal swab, presence of viral mutation(s) within the areas targeted by this assay, and inadequate number of viral copies(<138 copies/mL). A negative result must be combined with clinical observations, patient history, and epidemiological information. The expected result is Negative.  Fact Sheet for Patients:  BloggerCourse.com  Fact Sheet for Healthcare Providers:  SeriousBroker.it  This test is no t yet approved or cleared by the [place]  FDA and  has been authorized for detection and/or diagnosis of SARS-CoV-2  by FDA under an Emergency Use Authorization (EUA). This EUA will remain  in effect  (meaning this test can be used) for the duration of the COVID-19 declaration under Section 564(b)(1) of the Act, 21 U.S.C.section 360bbb-3(b)(1), unless the authorization is terminated  or revoked sooner.       Influenza A by PCR NEGATIVE NEGATIVE Final   Influenza B by PCR NEGATIVE NEGATIVE Final    Comment: (NOTE) The Xpert Xpress SARS-CoV-2/FLU/RSV plus assay is intended as an aid in the diagnosis of influenza from Nasopharyngeal swab specimens and should not be used as a sole basis for treatment. Nasal washings and aspirates are unacceptable for Xpert Xpress SARS-CoV-2/FLU/RSV testing.  Fact Sheet for Patients: BloggerCourse.com  Fact Sheet for Healthcare Providers: SeriousBroker.it  This test is not yet approved or cleared by the [place]  FDA and has been authorized for detection and/or diagnosis of SARS-CoV-2 by FDA under an Emergency Use Authorization (EUA). This EUA will remain in effect (meaning this test can be used) for the duration of the COVID-19 declaration under Section 564(b)(1) of the Act, 21 U.S.C. section 360bbb-3(b)(1), unless the authorization is terminated or revoked.     Resp Syncytial Virus by PCR NEGATIVE NEGATIVE Final    Comment: (NOTE) Fact Sheet for Patients: BloggerCourse.com  Fact Sheet for Healthcare Providers: SeriousBroker.it  This test is not yet approved or cleared by the [place]  FDA and has been authorized for detection and/or diagnosis of SARS-CoV-2 by FDA under an Emergency Use Authorization (EUA). This EUA will remain in effect (meaning this test can be used) for the duration of the COVID-19 declaration under Section 564(b)(1) of the Act, 21 U.S.C. section 360bbb-3(b)(1), unless the authorization is terminated or revoked.  Performed at [facility], [address] [place], [place] [address]   Respiratory (~20  pathogens) panel by PCR     Status: None   Collection Time: 06/03/24  9:37 AM   Specimen: Nasopharyngeal Swab; Respiratory  Result Value Ref Range Status   Adenovirus NOT DETECTED NOT DETECTED Final   Coronavirus 229E NOT DETECTED NOT DETECTED Final    Comment: (NOTE) The Coronavirus on the Respiratory Panel, DOES NOT test for the novel  Coronavirus (2019 nCoV)    Coronavirus HKU1 NOT DETECTED NOT DETECTED Final   Coronavirus NL63 NOT DETECTED NOT DETECTED Final   Coronavirus OC43 NOT DETECTED NOT DETECTED Final   Metapneumovirus NOT DETECTED NOT DETECTED Final   Rhinovirus / Enterovirus NOT DETECTED NOT DETECTED Final   Influenza A NOT DETECTED NOT DETECTED Final   Influenza B NOT DETECTED NOT DETECTED Final   Parainfluenza Virus 1 NOT DETECTED NOT DETECTED Final   Parainfluenza Virus 2 NOT DETECTED NOT DETECTED Final   Parainfluenza Virus 3 NOT DETECTED NOT DETECTED Final   Parainfluenza Virus 4 NOT DETECTED NOT DETECTED Final   Respiratory Syncytial Virus NOT DETECTED NOT DETECTED Final   Bordetella pertussis NOT DETECTED NOT DETECTED Final   Bordetella Parapertussis NOT DETECTED NOT DETECTED Final   Chlamydophila pneumoniae NOT DETECTED NOT DETECTED Final   Mycoplasma pneumoniae NOT DETECTED NOT DETECTED Final    Comment: Performed at [facility], [address], [place], [place] [address]  Blood Culture (routine x 2)     Status: None   Collection Time: 06/03/24 10:45 AM   Specimen: BLOOD  Result Value Ref Range Status   Specimen Description BLOOD LEFT ANTECUBITAL  Final   Special Requests   Final    BOTTLES DRAWN AEROBIC AND ANAEROBIC  Blood Culture adequate volume   Culture   Final    NO GROWTH 5 DAYS Performed at [facility], [address] [place], [place] [address]    Report Status 06/08/2024 FINAL  Final  Blood Culture (routine x 2)     Status: None   Collection Time: 06/03/24 10:45 AM   Specimen: BLOOD  Result Value Ref Range Status   Specimen  Description BLOOD LEFT ARM  Final   Special Requests   Final    BOTTLES DRAWN AEROBIC AND ANAEROBIC Blood Culture results may not be optimal due to an inadequate volume of blood received in culture bottles   Culture   Final    NO GROWTH 5 DAYS Performed at [facility], [address] [place], [place] [address]    Report Status 06/08/2024 FINAL  Final  MRSA Next Gen by PCR, Nasal     Status: None   Collection Time: 06/03/24  1:55 PM  Result Value Ref Range Status   MRSA by PCR Next Gen NOT DETECTED NOT DETECTED Final    Comment: (NOTE) The GeneXpert MRSA Assay (FDA approved for NASAL specimens only), is one component of a comprehensive MRSA colonization surveillance program. It is not intended to diagnose MRSA infection nor to guide or monitor treatment for MRSA infections. Test performance is not FDA approved in patients less than 63 years old. Performed at [facility], [address] [place], [place] [address]     Procedures and diagnostic studies:  No results found.              LOS: 4 days   [name]  [facility]   Pager on [email or web address]. If 7PM-7AM, please contact night-coverage at [email or web address]     06/08/2024, 1:55 PM

## 2024-06-08 NOTE — Plan of Care (Signed)

## 2024-06-09 DIAGNOSIS — Z970 Presence of artificial eye: Secondary | ICD-10-CM | POA: Diagnosis not present

## 2024-06-09 DIAGNOSIS — H409 Unspecified glaucoma: Secondary | ICD-10-CM | POA: Diagnosis not present

## 2024-06-09 DIAGNOSIS — Z809 Family history of malignant neoplasm, unspecified: Secondary | ICD-10-CM | POA: Diagnosis not present

## 2024-06-09 DIAGNOSIS — R2689 Other abnormalities of gait and mobility: Secondary | ICD-10-CM | POA: Diagnosis not present

## 2024-06-09 DIAGNOSIS — Z885 Allergy status to narcotic agent status: Secondary | ICD-10-CM | POA: Diagnosis not present

## 2024-06-09 DIAGNOSIS — M65949 Unspecified synovitis and tenosynovitis, unspecified hand: Secondary | ICD-10-CM | POA: Diagnosis not present

## 2024-06-09 DIAGNOSIS — K5900 Constipation, unspecified: Secondary | ICD-10-CM | POA: Diagnosis not present

## 2024-06-09 DIAGNOSIS — E871 Hypo-osmolality and hyponatremia: Secondary | ICD-10-CM | POA: Diagnosis present

## 2024-06-09 DIAGNOSIS — J189 Pneumonia, unspecified organism: Secondary | ICD-10-CM | POA: Diagnosis not present

## 2024-06-09 DIAGNOSIS — I10 Essential (primary) hypertension: Secondary | ICD-10-CM | POA: Diagnosis not present

## 2024-06-09 DIAGNOSIS — E785 Hyperlipidemia, unspecified: Secondary | ICD-10-CM | POA: Diagnosis not present

## 2024-06-09 DIAGNOSIS — Z9049 Acquired absence of other specified parts of digestive tract: Secondary | ICD-10-CM | POA: Diagnosis not present

## 2024-06-09 DIAGNOSIS — Z7982 Long term (current) use of aspirin: Secondary | ICD-10-CM | POA: Diagnosis not present

## 2024-06-09 DIAGNOSIS — R972 Elevated prostate specific antigen [PSA]: Secondary | ICD-10-CM | POA: Diagnosis not present

## 2024-06-09 DIAGNOSIS — R4189 Other symptoms and signs involving cognitive functions and awareness: Secondary | ICD-10-CM | POA: Diagnosis not present

## 2024-06-09 DIAGNOSIS — Z8546 Personal history of malignant neoplasm of prostate: Secondary | ICD-10-CM | POA: Diagnosis not present

## 2024-06-09 DIAGNOSIS — D590 Drug-induced autoimmune hemolytic anemia: Secondary | ICD-10-CM | POA: Diagnosis not present

## 2024-06-09 DIAGNOSIS — Z79899 Other long term (current) drug therapy: Secondary | ICD-10-CM | POA: Diagnosis not present

## 2024-06-09 DIAGNOSIS — Z79624 Long term (current) use of inhibitors of nucleotide synthesis: Secondary | ICD-10-CM | POA: Diagnosis not present

## 2024-06-09 DIAGNOSIS — R413 Other amnesia: Secondary | ICD-10-CM | POA: Diagnosis not present

## 2024-06-09 DIAGNOSIS — D591 Autoimmune hemolytic anemia, unspecified: Secondary | ICD-10-CM | POA: Diagnosis not present

## 2024-06-09 DIAGNOSIS — F419 Anxiety disorder, unspecified: Secondary | ICD-10-CM | POA: Diagnosis not present

## 2024-06-09 DIAGNOSIS — Z9181 History of falling: Secondary | ICD-10-CM | POA: Diagnosis not present

## 2024-06-09 DIAGNOSIS — R310 Gross hematuria: Secondary | ICD-10-CM | POA: Diagnosis not present

## 2024-06-09 DIAGNOSIS — R339 Retention of urine, unspecified: Secondary | ICD-10-CM | POA: Diagnosis not present

## 2024-06-09 DIAGNOSIS — R32 Unspecified urinary incontinence: Secondary | ICD-10-CM | POA: Diagnosis not present

## 2024-06-09 DIAGNOSIS — M6281 Muscle weakness (generalized): Secondary | ICD-10-CM | POA: Diagnosis not present

## 2024-06-09 DIAGNOSIS — D75839 Thrombocytosis, unspecified: Secondary | ICD-10-CM

## 2024-06-09 DIAGNOSIS — Z881 Allergy status to other antibiotic agents status: Secondary | ICD-10-CM | POA: Diagnosis not present

## 2024-06-09 DIAGNOSIS — D693 Immune thrombocytopenic purpura: Secondary | ICD-10-CM | POA: Diagnosis present

## 2024-06-09 DIAGNOSIS — E7800 Pure hypercholesterolemia, unspecified: Secondary | ICD-10-CM | POA: Diagnosis not present

## 2024-06-09 DIAGNOSIS — R410 Disorientation, unspecified: Secondary | ICD-10-CM | POA: Diagnosis not present

## 2024-06-09 DIAGNOSIS — R3915 Urgency of urination: Secondary | ICD-10-CM | POA: Diagnosis not present

## 2024-06-09 DIAGNOSIS — K219 Gastro-esophageal reflux disease without esophagitis: Secondary | ICD-10-CM | POA: Diagnosis not present

## 2024-06-09 DIAGNOSIS — Z820 Family history of epilepsy and other diseases of the nervous system: Secondary | ICD-10-CM | POA: Diagnosis not present

## 2024-06-09 DIAGNOSIS — N3001 Acute cystitis with hematuria: Secondary | ICD-10-CM | POA: Diagnosis not present

## 2024-06-09 DIAGNOSIS — N179 Acute kidney failure, unspecified: Secondary | ICD-10-CM | POA: Diagnosis not present

## 2024-06-09 DIAGNOSIS — Z741 Need for assistance with personal care: Secondary | ICD-10-CM | POA: Diagnosis not present

## 2024-06-09 DIAGNOSIS — M2550 Pain in unspecified joint: Secondary | ICD-10-CM | POA: Diagnosis present

## 2024-06-09 DIAGNOSIS — F5101 Primary insomnia: Secondary | ICD-10-CM | POA: Diagnosis not present

## 2024-06-09 DIAGNOSIS — G250 Essential tremor: Secondary | ICD-10-CM | POA: Diagnosis not present

## 2024-06-09 HISTORY — DX: Thrombocytosis, unspecified: D75.839

## 2024-06-09 LAB — BASIC METABOLIC PANEL WITH GFR
Anion gap: 9 (ref 5–15)
BUN: 14 mg/dL (ref 8–23)
CO2: 25 mmol/L (ref 22–32)
Calcium: 8.7 mg/dL — ABNORMAL LOW (ref 8.9–10.3)
Chloride: 98 mmol/L (ref 98–111)
Creatinine, Ser: 0.72 mg/dL (ref 0.61–1.24)
GFR, Estimated: >60 mL/min (ref >60)
Glucose, Bld: 106 mg/dL — ABNORMAL HIGH (ref 70–99)
Potassium: 3.7 mmol/L (ref 3.5–5.1)
Sodium: 132 mmol/L — ABNORMAL LOW (ref 135–145)

## 2024-06-09 LAB — CBC
HCT: 32.1 % — ABNORMAL LOW (ref 39.0–52.0)
Hemoglobin: 11.1 g/dL — ABNORMAL LOW (ref 13.0–17.0)
MCH: 33.8 pg (ref 26.0–34.0)
MCHC: 34.6 g/dL (ref 30.0–36.0)
MCV: 97.9 fL (ref 80.0–100.0)
Platelets: 616 K/uL — ABNORMAL HIGH (ref 150–400)
RBC: 3.28 MIL/uL — ABNORMAL LOW (ref 4.22–5.81)
RDW: 12.9 % (ref 11.5–15.5)
WBC: 13.2 K/uL — ABNORMAL HIGH (ref 4.0–10.5)
nRBC: 0 % (ref 0.0–0.2)

## 2024-06-09 MED ORDER — ASPIRIN 81 MG PO TBEC
81.0000 mg | DELAYED_RELEASE_TABLET | Freq: Every day | ORAL | Status: DC
  Administered 2024-06-09: 81 mg via ORAL
  Filled 2024-06-09: qty 1

## 2024-06-09 MED ORDER — ASPIRIN 81 MG PO TBEC: 81.0000 mg | DELAYED_RELEASE_TABLET | Freq: Every day | ORAL | Status: DC

## 2024-06-09 NOTE — Discharge Summary (Signed)
 Physician Discharge Summary   Patient: [name]. MRN: [id number] DOB: 30-Jun-1932  Admit date:     05/31/2024  Discharge date: 06/09/24  Discharge Physician: AIDA CHO   PCP: [name], MD   Recommendations at discharge:   Follow-up with physician at the nursing home within 3 days of discharge Repeat CBC and BMP within 1 week of discharge. Outpatient follow-up with Dr. Rennie, his hematologist. Outpatient follow-up with his PCP for routine health maintenance  Discharge Diagnoses: Principal Problem:   CAP (community acquired pneumonia) Active Problems:   Essential hypertension   Lobar pneumonia (HCC)   Hyponatremia   Thrombocytosis  Resolved Problems:   * No resolved hospital problems. *  Hospital Course:  [name]. is a 89 y.o. male  with medical history significant of recently diagnosed right hand tenosynovitis, HTN, history of drug-induced autoimmune hemolytic anemia, prostate cancer history of ITP, brought in by family member for evaluation of ambulation difficulties.  He was recently from 05/24/2024 through 05/31/2024 after hospitalization for tenosynovitis of right fifth finger, s/p I&D and discharged home on Augmentin .  His wife states that patient has been very weak and has become increasingly difficult to take care of him at home (since discharge from the hospital).   Reportedly, his blood pressure at home was in the 200s.  BP with EMS was 199/102.  Patient was brought to the ED on 05/31/2024 because of severe hypertension.  Patient had been boarding in the ED.  He was evaluated by a physical therapist who recommended discharge to acute rehab.  While in the ED, he developed a dry cough and low-grade fever on 06/02/2024.  Chest x-ray showed left lower lobe pneumonia.  Hospitalist team was consulted for admission.    Assessment and Plan:   Left lower lobe pneumonia: Completed 5 days of IV cefepime  on 06/07/2024. No growth on blood cultures  thus far.  Respiratory panel was negative.  SARS-CoV-2, RSV and influenza A and B were negative. Leukocytosis has resolved      General weakness, ambulatory dysfunction: PT recommended discharge to SNF.       Recent tenosynovitis of right fingers and right hand: [place], [place], with orthopedic surgery removed right hand sutures on 06/06/2024 patient had been discharged on Augmentin  when he was discharged from the hospital on 05/31/2024. No additional antibiotics required     Hypertensive urgency: BP improved.  Continue antihypertensives.     Heart murmur: 2D echo was reported as technically difficult study.  If showed preserved EF of 60 to 65%, moderate concentric LVH, grade 1 diastolic dysfunction, trivial mitral valve regurgitation, normal aortic valve structure      Hypokalemia: Improved Chronic hyponatremia: Asymptomatic.  He was started on salt tablets for a few days but doubt this will make a significant impact on sodium levels.  Since sodium levels have been stable for several years, we will avoid salt tablets for now. 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    Low serum osmolality 274, normal urine sodium 25 and low urine osmolality 268.  TSH was 0.9.  Cortisol was 15.   Of note, patient has had intermittently low sodium dating back to February 10, 2015 when his sodium was 129.       Thrombocytosis: Platelet count is trending upward [phone].  Etiology unclear but this may be reactive from recent infection.  Consulted Dr. Rennie, hematologist.   He recommended low-dose aspirin .  Case discussed with Dr. Rennie via secure chat on 06/09/2024.  Comorbidities include history of ITP, autoimmune hemolytic anemia, hyperlipidemia   His condition is improved and he is deemed stable for discharge to SNF today.  Discharge plan was discussed with his wife and his sister at the bedside.       Consultants: Orthopedic surgeon, hematologist Procedures  performed: None Disposition: Skilled nursing facility Diet recommendation:  Discharge Diet Orders (From admission, onward)     Start     Ordered   06/09/24 0000  Diet - low sodium heart healthy        06/09/24 1023           Regular diet DISCHARGE MEDICATION: Allergies as of 06/09/2024       Reactions   Cephalexin  Diarrhea   C.diff   Hydrocodone-acetaminophen  Nausea Only   Vomiting/dizziness        Medication List     PAUSE taking these medications    eltrombopag  25 MG tablet Wait to take this until your doctor or other care provider tells you to start again. Commonly known as: Promacta  TAKE 1 TABLET (25 MG TOTAL) BY MOUTH EVERY OTHER DAY. TAKE ON AN EMPTY STOMACH, 1 HOUR BEFORE A MEAL OR 2 HOURS AFTER.   mycophenolate  500 MG tablet Wait to take this until your doctor or other care provider tells you to start again. Commonly known as: CELLCEPT  Take 1 tablet (500 mg total) by mouth daily.       STOP taking these medications    amoxicillin -clavulanate 875-125 MG tablet Commonly known as: AUGMENTIN        TAKE these medications    acetaminophen  500 MG tablet Commonly known as: TYLENOL  Take 500 mg by mouth every 6 (six) hours as needed for mild pain. Reported on 01/24/2016   amLODipine  5 MG tablet Commonly known as: NORVASC  Take 1 tablet (5 mg total) by mouth daily.   aspirin  EC 81 MG tablet Take 1 tablet (81 mg total) by mouth daily. Swallow whole.   citalopram  20 MG tablet Commonly known as: CELEXA  Take 20 mg by mouth daily.   dorzolamide  2 % ophthalmic solution Commonly known as: TRUSOPT  Place 1 drop into the right eye 2 (two) times daily.   latanoprost  0.005 % ophthalmic solution Commonly known as: XALATAN  Place 1 drop into the right eye at bedtime.   lisinopril  20 MG tablet Commonly known as: ZESTRIL  Take 20 mg by mouth daily.   mirabegron  ER 25 MG Tb24 tablet Commonly known as: MYRBETRIQ  Take 25 mg by mouth daily.   omeprazole  40 MG  capsule Commonly known as: PRILOSEC TAKE 1 CAPSULE BY MOUTH DAILY USUALLY 30 MINUTES BEFORE BREAKFAST   pravastatin  20 MG tablet Commonly known as: PRAVACHOL  Take 20 mg by mouth at bedtime.   propranolol  40 MG tablet Commonly known as: INDERAL  Take 1 tablet by mouth 2 (two) times daily.   Vitamin D3 250 MCG (10000 UT) capsule Take 10,000 Units by mouth daily.        Contact information for after-discharge care     Destination     [facility] .   Service: Skilled Nursing Contact information: [address] [place]  [phone]                    Discharge Exam:  GEN: NAD SKIN: Warm and dry EYES: No pallor or icterus ENT: MMM CV: RRR PULM: CTA B ABD: soft, ND, NT, +BS CNS: AAO x 3, non focal EXT: No edema or tenderness   Condition at  discharge: good  The results of significant diagnostics from this hospitalization (including imaging, microbiology, ancillary and laboratory) are listed below for reference.   Imaging Studies: ECHOCARDIOGRAM COMPLETE Result Date: 06/08/2024    ECHOCARDIOGRAM REPORT   Patient Name:   Yigit Norkus. Date of Exam: 06/08/2024 Medical Rec #:  [id number]            Height:       67.0 in Accession #:    [id number]           Weight:       167.0 lb Date of Birth:  02/22/1932            BSA:          1.873 m Patient Age:    89 years             BP:           168/81 mmHg Patient Gender: M                    HR:           81 bpm. Exam Location:  [facility] Procedure: 2D Echo, Color Doppler and Cardiac Doppler (Both Spectral and Color            Flow Doppler were utilized during procedure). Indications:     R01.1 Murmur  History:         Patient has no prior history of Echocardiogram examinations.                  History of Prostate CA; Risk Factors:Hypertension and                  Dyslipidemia.  Sonographer:     L. Thornton-Maynard, RDCS Referring Phys:  [id number] AIDA CHO Diagnosing Phys: Cara JONETTA Lovelace MD  IMPRESSIONS  1. TDS.  2. Left ventricular ejection fraction, by estimation, is 60 to 65%. The left ventricle has normal function. The left ventricle has no regional wall motion abnormalities. There is moderate concentric left ventricular hypertrophy. Left ventricular diastolic parameters are consistent with Grade I diastolic dysfunction (impaired relaxation).  3. Right ventricular systolic function is normal. The right ventricular size is normal.  4. The mitral valve is normal in structure. Trivial mitral valve regurgitation.  5. The aortic valve is normal in structure. Aortic valve regurgitation is not visualized. Conclusion(s)/Recommendation(s): Poor windows for evaluation of left ventricular function by transthoracic echocardiography. Would recommend an alternative means of evaluation. FINDINGS  Left Ventricle: Left ventricular ejection fraction, by estimation, is 60 to 65%. The left ventricle has normal function. The left ventricle has no regional wall motion abnormalities. Strain was performed and the global longitudinal strain is indeterminate. The left ventricular internal cavity size was normal in size. There is moderate concentric left ventricular hypertrophy. Left ventricular diastolic parameters are consistent with Grade I diastolic dysfunction (impaired relaxation). Right Ventricle: The right ventricular size is normal. No increase in right ventricular wall thickness. Right ventricular systolic function is normal. Left Atrium: Left atrial size was normal in size. Right Atrium: Right atrial size was normal in size. Pericardium: There is no evidence of pericardial effusion. Mitral Valve: The mitral valve is normal in structure. Trivial mitral valve regurgitation. Tricuspid Valve: The tricuspid valve is normal in structure. Tricuspid valve regurgitation is mild. Aortic Valve: The aortic valve is normal in structure. Aortic valve regurgitation is not visualized. Aortic valve mean gradient measures 16.0 mmHg.  Aortic valve peak gradient measures  27.2 mmHg. Aortic valve area, by VTI measures 2.02 cm. Pulmonic Valve: The pulmonic valve was normal in structure. Pulmonic valve regurgitation is not visualized. Aorta: The ascending aorta was not well visualized. IAS/Shunts: No atrial level shunt detected by color flow Doppler. Additional Comments: TDS. 3D was performed not requiring image post processing on an independent workstation and was indeterminate.  LEFT VENTRICLE PLAX 2D LVIDd:         4.30 cm     Diastology LVIDs:         2.90 cm     LV e' medial:    5.55 cm/s LV PW:         1.60 cm     LV E/e' medial:  15.3 LV IVS:        1.60 cm     LV e' lateral:   6.53 cm/s LVOT diam:     2.40 cm     LV E/e' lateral: 13.0 LV SV:         100 LV SV Index:   53 LVOT Area:     4.52 cm  LV Volumes (MOD) LV vol d, MOD A2C: 55.0 ml LV vol d, MOD A4C: 86.0 ml LV vol s, MOD A2C: 17.8 ml LV vol s, MOD A4C: 35.9 ml LV SV MOD A2C:     37.2 ml LV SV MOD A4C:     86.0 ml LV SV MOD BP:      45.5 ml RIGHT VENTRICLE RV Basal diam:  3.50 cm RV S prime:     18.20 cm/s TAPSE (M-mode): 1.8 cm LEFT ATRIUM             Index        RIGHT ATRIUM           Index LA diam:        3.20 cm 1.71 cm/m   RA Area:     17.70 cm LA Vol (A2C):   57.6 ml 30.75 ml/m  RA Volume:   47.60 ml  25.41 ml/m LA Vol (A4C):   64.7 ml 34.54 ml/m LA Biplane Vol: 63.7 ml 34.01 ml/m  AORTIC VALVE                     PULMONIC VALVE AV Area (Vmax):    1.85 cm      PV Vmax:       1.01 m/s AV Area (Vmean):   1.79 cm      PV Peak grad:  4.1 mmHg AV Area (VTI):     2.02 cm AV Vmax:           261.00 cm/s AV Vmean:          185.000 cm/s AV VTI:            0.495 m AV Peak Grad:      27.2 mmHg AV Mean Grad:      16.0 mmHg LVOT Vmax:         107.00 cm/s LVOT Vmean:        73.400 cm/s LVOT VTI:          0.221 m LVOT/AV VTI ratio: 0.45  AORTA Ao Root diam: 3.40 cm Ao Asc diam:  3.20 cm MITRAL VALVE MV Area (PHT): 3.65 cm     SHUNTS MV Decel Time: 208 msec     Systemic VTI:  0.22 m MV  E velocity: 84.90 cm/s   Systemic Diam: 2.40 cm MV A velocity: 102.00 cm/s MV  E/A ratio:  0.83 Cara JONETTA Lovelace MD Electronically signed by Cara JONETTA Lovelace MD Signature Date/Time: 06/08/2024/2:12:46 PM    Final    DG Chest Portable 1 View Result Date: 06/03/2024 CLINICAL DATA:  Fever and hypertension EXAM: PORTABLE CHEST 1 VIEW COMPARISON:  05/29/2024 FINDINGS: Apical lordotic portable AP radiograph. Midline trachea. Cardiomegaly accentuated by AP portable technique. Atherosclerosis in the transverse aorta. Possible trace left pleural fluid. No pneumothorax. Low lung volumes. Interstitial coarsening is nonspecific and accentuated by AP portable technique. Increased left base airspace disease. Minimal right base subsegmental atelectasis or scar. IMPRESSION: Worsened left lower lobe airspace disease, favoring infection or aspiration. Aortic Atherosclerosis (ICD10-I70.0). Electronically Signed   By: Rockey Kilts M.D.   On: 06/03/2024 09:09   DG Chest Port 1 View Result Date: 05/29/2024 CLINICAL DATA:  Leukocytosis.  History prostate cancer. EXAM: PORTABLE CHEST 1 VIEW COMPARISON:  12/08/2014 FINDINGS: Atherosclerotic calcification of the aortic arch. Confluent accentuated linear markings at the left lung base could be from atelectasis or early bronchopneumonia. The lungs appear otherwise clear. No blunting of the costophrenic angles. Heart size within normal limits. IMPRESSION: 1. Confluent accentuated linear markings at the left lung base could be from atelectasis or early bronchopneumonia. 2. Aortic Atherosclerosis (ICD10-I70.0). Electronically Signed   By: Ryan Salvage M.D.   On: 05/29/2024 16:42   DG Foot Complete Right Result Date: 05/29/2024 CLINICAL DATA:  [id number] Right foot pain [id number] EXAM: RIGHT FOOT COMPLETE - 3+ VIEW COMPARISON:  None Available. FINDINGS: No acute fracture or dislocation. There is no evidence of arthropathy or other focal bone abnormality. Soft tissues are unremarkable.  Peripheral vascular atherosclerosis. IMPRESSION: No acute fracture or dislocation. Electronically Signed   By: Rogelia Myers M.D.   On: 05/29/2024 16:40   DG Hand Complete Right Result Date: 05/24/2024 CLINICAL DATA:  Fifth finger swelling since awakening yesterday. EXAM: RIGHT HAND - COMPLETE 3+ VIEW COMPARISON:  Wrist radiographs 09/08/2021. FINDINGS: No evidence of acute fracture, dislocation or acute bone destruction. Incomplete healing of previously demonstrated fracture of the radial styloid. There are mild-to-moderate degenerative changes at the proximal interphalangeal joint of the small finger. Questionable erosion of the neck of the 5th proximal phalanx. Additional degenerative changes within the wrist and at the 1st carpometacarpal joint. There are scattered capsular calcifications as well as TFCC calcification. Soft tissue swelling noted in the small finger without evidence of foreign body or soft tissue emphysema. IMPRESSION: 1. Nonspecific soft tissue swelling in the small finger without evidence of foreign body or acute bone destruction. 2. Degenerative changes at the proximal interphalangeal joint of the small finger with questionable erosion of the neck of the 5th proximal phalanx. 3. Incomplete healing of previously demonstrated fracture of the radial styloid. Electronically Signed   By: Elsie Perone M.D.   On: 05/24/2024 12:44    Microbiology: Results for orders placed or performed during the hospital encounter of 05/31/24  Resp panel by RT-PCR (RSV, Flu A&B, Covid) Nasopharyngeal Swab     Status: None   Collection Time: 06/03/24  9:37 AM   Specimen: Nasopharyngeal Swab; Nasal Swab  Result Value Ref Range Status   SARS Coronavirus 2 by RT PCR NEGATIVE NEGATIVE Final    Comment: (NOTE) SARS-CoV-2 target nucleic acids are NOT DETECTED.  The SARS-CoV-2 RNA is generally detectable in upper respiratory specimens during the acute phase of infection. The lowest concentration of  SARS-CoV-2 viral copies this assay can detect is 138 copies/mL. A negative result does not preclude SARS-Cov-2 infection and  should not be used as the sole basis for treatment or other patient management decisions. A negative result may occur with  improper specimen collection/handling, submission of specimen other than nasopharyngeal swab, presence of viral mutation(s) within the areas targeted by this assay, and inadequate number of viral copies(<138 copies/mL). A negative result must be combined with clinical observations, patient history, and epidemiological information. The expected result is Negative.  Fact Sheet for Patients:  BloggerCourse.com  Fact Sheet for Healthcare Providers:  SeriousBroker.it  This test is no t yet approved or cleared by the [place]  FDA and  has been authorized for detection and/or diagnosis of SARS-CoV-2 by FDA under an Emergency Use Authorization (EUA). This EUA will remain  in effect (meaning this test can be used) for the duration of the COVID-19 declaration under Section 564(b)(1) of the Act, 21 U.S.C.section 360bbb-3(b)(1), unless the authorization is terminated  or revoked sooner.       Influenza A by PCR NEGATIVE NEGATIVE Final   Influenza B by PCR NEGATIVE NEGATIVE Final    Comment: (NOTE) The Xpert Xpress SARS-CoV-2/FLU/RSV plus assay is intended as an aid in the diagnosis of influenza from Nasopharyngeal swab specimens and should not be used as a sole basis for treatment. Nasal washings and aspirates are unacceptable for Xpert Xpress SARS-CoV-2/FLU/RSV testing.  Fact Sheet for Patients: BloggerCourse.com  Fact Sheet for Healthcare Providers: SeriousBroker.it  This test is not yet approved or cleared by the [place]  FDA and has been authorized for detection and/or diagnosis of SARS-CoV-2 by FDA under an Emergency Use  Authorization (EUA). This EUA will remain in effect (meaning this test can be used) for the duration of the COVID-19 declaration under Section 564(b)(1) of the Act, 21 U.S.C. section 360bbb-3(b)(1), unless the authorization is terminated or revoked.     Resp Syncytial Virus by PCR NEGATIVE NEGATIVE Final    Comment: (NOTE) Fact Sheet for Patients: BloggerCourse.com  Fact Sheet for Healthcare Providers: SeriousBroker.it  This test is not yet approved or cleared by the [place]  FDA and has been authorized for detection and/or diagnosis of SARS-CoV-2 by FDA under an Emergency Use Authorization (EUA). This EUA will remain in effect (meaning this test can be used) for the duration of the COVID-19 declaration under Section 564(b)(1) of the Act, 21 U.S.C. section 360bbb-3(b)(1), unless the authorization is terminated or revoked.  Performed at [facility], [address] [place], [place] [address]   Respiratory (~20 pathogens) panel by PCR     Status: None   Collection Time: 06/03/24  9:37 AM   Specimen: Nasopharyngeal Swab; Respiratory  Result Value Ref Range Status   Adenovirus NOT DETECTED NOT DETECTED Final   Coronavirus 229E NOT DETECTED NOT DETECTED Final    Comment: (NOTE) The Coronavirus on the Respiratory Panel, DOES NOT test for the novel  Coronavirus (2019 nCoV)    Coronavirus HKU1 NOT DETECTED NOT DETECTED Final   Coronavirus NL63 NOT DETECTED NOT DETECTED Final   Coronavirus OC43 NOT DETECTED NOT DETECTED Final   Metapneumovirus NOT DETECTED NOT DETECTED Final   Rhinovirus / Enterovirus NOT DETECTED NOT DETECTED Final   Influenza A NOT DETECTED NOT DETECTED Final   Influenza B NOT DETECTED NOT DETECTED Final   Parainfluenza Virus 1 NOT DETECTED NOT DETECTED Final   Parainfluenza Virus 2 NOT DETECTED NOT DETECTED Final   Parainfluenza Virus 3 NOT DETECTED NOT DETECTED Final   Parainfluenza Virus 4 NOT  DETECTED NOT DETECTED Final   Respiratory Syncytial Virus NOT  DETECTED NOT DETECTED Final   Bordetella pertussis NOT DETECTED NOT DETECTED Final   Bordetella Parapertussis NOT DETECTED NOT DETECTED Final   Chlamydophila pneumoniae NOT DETECTED NOT DETECTED Final   Mycoplasma pneumoniae NOT DETECTED NOT DETECTED Final    Comment: Performed at [facility], [address], [place], [place] [address]  Blood Culture (routine x 2)     Status: None   Collection Time: 06/03/24 10:45 AM   Specimen: BLOOD  Result Value Ref Range Status   Specimen Description BLOOD LEFT ANTECUBITAL  Final   Special Requests   Final    BOTTLES DRAWN AEROBIC AND ANAEROBIC Blood Culture adequate volume   Culture   Final    NO GROWTH 5 DAYS Performed at [facility], [address] [place], [place] [address]    Report Status 06/08/2024 FINAL  Final  Blood Culture (routine x 2)     Status: None   Collection Time: 06/03/24 10:45 AM   Specimen: BLOOD  Result Value Ref Range Status   Specimen Description BLOOD LEFT ARM  Final   Special Requests   Final    BOTTLES DRAWN AEROBIC AND ANAEROBIC Blood Culture results may not be optimal due to an inadequate volume of blood received in culture bottles   Culture   Final    NO GROWTH 5 DAYS Performed at [facility], [address] [place], [place] [address]    Report Status 06/08/2024 FINAL  Final  MRSA Next Gen by PCR, Nasal     Status: None   Collection Time: 06/03/24  1:55 PM  Result Value Ref Range Status   MRSA by PCR Next Gen NOT DETECTED NOT DETECTED Final    Comment: (NOTE) The GeneXpert MRSA Assay (FDA approved for NASAL specimens only), is one component of a comprehensive MRSA colonization surveillance program. It is not intended to diagnose MRSA infection nor to guide or monitor treatment for MRSA infections. Test performance is not FDA approved in patients less than 9 years old. Performed at [facility], [address] [place], [place] [address]     Labs: CBC: Recent Labs  Lab 06/05/24 [phone] 06/06/24 0426 06/07/24 0523 06/08/24 0453 06/09/24 0502  WBC 11.5* 15.3* 9.5 13.2* 13.2*  HGB 10.8* 10.8* 10.9* 11.3* 11.1*  HCT 30.8* 30.9* 31.0* 32.0* 32.1*  MCV 97.8 97.2 97.5 96.4 97.9  PLT 442* 482* 528* 554* 616*   Basic Metabolic Panel: Recent Labs  Lab 06/05/24 0426 06/06/24 0426 06/07/24 0523 06/08/24 0453 06/09/24 0502  NA 133* 129* 134* 132* 132*  K 3.4* 4.1 4.0 3.7 3.7  CL 104 99 105 102 98  CO2 22 21* 22 23 25   GLUCOSE 87 92 95 96 106*  BUN 17 18 19 17 14   CREATININE 0.71 0.63 0.82 0.66 0.72  CALCIUM 8.6* 8.4* 8.5* 8.2* 8.7*  MG  --  1.9  --   --   --   PHOS 2.9  --   --   --   --    Liver Function Tests: No results for input(s): AST, ALT, ALKPHOS, BILITOT, PROT, ALBUMIN in the last 168 hours. CBG: No results for input(s): GLUCAP in the last 168 hours.  Discharge time spent: greater than 30 minutes.  Signed: AIDA CHO, MD [facility] 06/09/2024

## 2024-06-09 NOTE — Care Management Important Message (Signed)
 Important Message  Patient Details  Name: [name]. MRN: [id number] Date of Birth: [birth year]   Important Message Given:        BRANDY CHRISTIANE ORN, CMA 06/09/2024, 12:29 PM

## 2024-06-09 NOTE — Care Management Important Message (Signed)
 Important Message  Patient Details  Name: [name]. MRN: [id number] Date of Birth: 1932-11-10   Important Message Given:  Yes - Medicare IM     Alarik Radu W, CMA 06/09/2024, 12:29 PM

## 2024-06-09 NOTE — TOC Transition Note (Addendum)
 Transition of Care [facility]) - Discharge Note   Patient Details  Name: [name]. MRN: [id number] Date of Birth: 1932-08-08  Transition of Care [facility]) CM/SW Contact:  Elouise LULLA Capri, RN 06/09/2024, 11:47 AM   Clinical Narrative:     Discharge orders noted, discharge summary noted, faxed to [facility]. BLS transport auth noted, [id number]. CM call to [facility], phone: [phone] regarding SNF auth. CM spoke to [place]. Per Jori, SNF shara is [phone]. CM call to [employer], phone: [phone] regarding BLS transport scheduling. CM spoke to [place]. BLS transport is today at 1400. CM alert to Dr. Jens and RN Delon.   CM call to patient's wife, [name], phone: [phone] regarding pending discharge and BLS transport time. No answer, CM left message for return call.   Final next level of care: Skilled Nursing Facility Barriers to Discharge: No Barriers Identified   Patient Goals and CMS Choice    SNF   Discharge Placement     SNF            Discharge Plan and Services Additional resources added to the After Visit Summary for     Social Drivers of Health (SDOH) Interventions SDOH Screenings   Food Insecurity: No Food Insecurity (05/24/2024)  Housing: Low Risk  (05/24/2024)  Transportation Needs: No Transportation Needs (05/24/2024)  Utilities: Not At Risk (05/24/2024)  Financial Resource Strain: Patient Declined (11/06/2023)   Received from [facility]  Social Connections: Moderately Integrated (05/24/2024)  Tobacco Use: Low Risk  (05/31/2024)     Readmission Risk Interventions     No data to display

## 2024-06-09 NOTE — Progress Notes (Signed)
 Patient to discharge to SNF. Transfer packet completed and printed. PIV removed. Report called.Questions answered. No concerns voiced at this time.

## 2024-06-10 DIAGNOSIS — R4182 Altered mental status, unspecified: Secondary | ICD-10-CM | POA: Diagnosis not present

## 2024-06-10 DIAGNOSIS — J189 Pneumonia, unspecified organism: Secondary | ICD-10-CM | POA: Diagnosis not present

## 2024-06-10 DIAGNOSIS — R404 Transient alteration of awareness: Secondary | ICD-10-CM | POA: Diagnosis not present

## 2024-06-10 DIAGNOSIS — Z743 Need for continuous supervision: Secondary | ICD-10-CM | POA: Diagnosis not present

## 2024-06-11 ENCOUNTER — Encounter: Payer: Self-pay | Admitting: Student

## 2024-06-11 ENCOUNTER — Non-Acute Institutional Stay (SKILLED_NURSING_FACILITY): Payer: Self-pay | Admitting: Student

## 2024-06-11 DIAGNOSIS — D590 Drug-induced autoimmune hemolytic anemia: Secondary | ICD-10-CM | POA: Diagnosis not present

## 2024-06-11 DIAGNOSIS — H409 Unspecified glaucoma: Secondary | ICD-10-CM | POA: Diagnosis not present

## 2024-06-11 DIAGNOSIS — J189 Pneumonia, unspecified organism: Secondary | ICD-10-CM | POA: Diagnosis not present

## 2024-06-11 DIAGNOSIS — Z96651 Presence of right artificial knee joint: Secondary | ICD-10-CM | POA: Insufficient documentation

## 2024-06-11 DIAGNOSIS — I10 Essential (primary) hypertension: Secondary | ICD-10-CM | POA: Diagnosis not present

## 2024-06-11 DIAGNOSIS — R399 Unspecified symptoms and signs involving the genitourinary system: Secondary | ICD-10-CM

## 2024-06-11 DIAGNOSIS — N179 Acute kidney failure, unspecified: Secondary | ICD-10-CM | POA: Diagnosis not present

## 2024-06-11 DIAGNOSIS — M65949 Unspecified synovitis and tenosynovitis, unspecified hand: Secondary | ICD-10-CM | POA: Diagnosis not present

## 2024-06-11 DIAGNOSIS — Z970 Presence of artificial eye: Secondary | ICD-10-CM

## 2024-06-11 DIAGNOSIS — Z9181 History of falling: Secondary | ICD-10-CM | POA: Insufficient documentation

## 2024-06-11 HISTORY — DX: Presence of right artificial knee joint: Z96.651

## 2024-06-11 NOTE — Progress Notes (Signed)
 Provider:  Dr. Richerd Brigham Location:  Other [place].  Nursing Home Room Number: [facility] [id number] Place of Service:  SNF (31)  PCP: [name], MD Patient Care Team: [name], MD as PCP - General (Family Medicine) [name], MD as Consulting Physician (Oncology)  Extended Emergency Contact Information Primary Emergency Contact: G[place]altney,[name]  [place] Home Phone: [phone] Mobile Phone: [phone] Relation: Spouse Secondary Emergency Contact: Imre, Vecchione  [place] Home Phone: [phone] Relation: Son  Code Status: Full Code Goals of Care: Advanced Directive information    06/03/2024   10:49 AM  Advanced Directives  Does Patient Have a Medical Advance Directive? Yes  Type of Estate agent of [place];Living will      Chief Complaint  Patient presents with   New Admit To SNF    Admission     HPI: Patient is a 89 y.o. male seen today for admission to [facility]. History of Present Illness The patient is an 89 year old who presents for rehabilitation following a prolonged hospital stay. He is accompanied by his wife, [name].  He was hospitalized for 16 days due to severe hypertension and subsequent pneumonia. Initially, he went to the hospital on July 5th for a suspected urinary problem, which he describes as minor and unclear. After returning home, he experienced severe trembling and a blood pressure of 260/116, prompting a return to the hospital where he developed pneumonia. During his hospital stay, he experienced confusion, attributed to an antibiotic, and was noted to have delirium with episodes of confusion about time and place, and was in atrial fibrillation.  He has a history of anxiety and has been on citalopram  20 mg for a couple of years. His wife reports that he was given a medication in syrup form at the hospital to manage anxiety, but it is not being  continued at the rehabilitation facility.  He has an essential or familial tremor that has worsened in recent months, affecting his ability to eat. He was previously on propranolol  for the tremor, but it is unclear if he is currently taking it.  He has a history of falls, attributed to balance issues and knee problems. He has had a knee replacement and reports recent bowing and separation in the knee, contributing to his falls. He has been in physical therapy previously and continues to work on exercises at home.  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 His vision in the right eye is 20/25.  He has a history of ITP and was previously treated with Rituxan , Cellcept , and Promacta . His platelets have been stable recently, but he was started on a baby aspirin  due to elevated platelet counts during his recent hospitalization.  His current medications include lisinopril  20 mg, Myrbetriq  for urinary issues, Norvasc  for blood pressure, omeprazole  for acid reflux, vitamin D3, and pravastatin . His blood pressure has been low recently.  He lives with his wife [name], and he has been married since 1958. He has two sons and a sister, but they do not live nearby. He was previously active, driving and managing some household tasks, but has slowed down due to age and recent health issues.  No issues with bowel movements or urination since being at the rehabilitation facility. He reports eating well and has no problems going to sleep at night. He has a history  of memory problems, particularly with names, and has experienced confusion since his hospitalization.  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 - Knee osteoarthritis with history of knee replacement -  History of falls - Immune thrombocytopenic purpura (ITP) - Prostate-related urinary urgency - Glaucoma  Surgical History: - Retinal surgery: Retinal surgery at [facility], successful - Eye surgery: Patectomy and removal of vision in left eye - Knee surgery: Knee replacement   Medications - Celexa  - Propranolol  - Baby aspirin  - Citalopram  20 mg - Lisinopril  20 mg - Myrbetriq  - Norvasc  - Omeprazole  - Vitamin D3 - Eye drops - Pravastatin   Social History - Employment: Dentist and Transport planner - Partner Status: Married - Living Situation: Lives with wife at home; currently in skilled nursing rehab facility   Family History - Wife: cancer, heart problems   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   Leukocytosis    Prostate cancer (HCC)    Shingles    Past Surgical History:  Procedure Laterality Date   APPENDECTOMY     artificial left eye     EYE SURGERY     FLEXOR TENDON REPAIR Right 05/24/2024   Procedure: RIGHT FIFTH FINGER TENDON FLEXOR IRRIGATION AND DEBRIDEMENT;  Surgeon: Tobie Priest, MD;  Location: [facility] ORS;  Service: Orthopedics;  Laterality: Right;   HERNIA REPAIR     JOINT REPLACEMENT     PROSTATE SURGERY      reports that he has never smoked. He has never used smokeless tobacco. He reports current alcohol  use. He reports that he does not use drugs. Social History   Socioeconomic History   Marital status: Married    Spouse name: Not on file   Number of children: Not on file   Years of education: Not on file   Highest education level: Not on file  Occupational History   Occupation: retired  Tobacco Use   Smoking status: Never   Smokeless tobacco: Never  Vaping Use   Vaping status: Never Used  Substance and Sexual Activity   Alcohol  use: Yes     Comment: occ   Drug use: No   Sexual activity: Not on file  Other Topics Concern   Not on file  Social History Narrative   Not on file   Social Drivers of Health   Financial Resource Strain: Patient Declined (11/06/2023)   Received from [facility]   Overall Financial Resource Strain (CARDIA)    Difficulty of Paying Living Expenses: Patient declined  Food Insecurity: No Food Insecurity (05/24/2024)   Hunger Vital Sign    Worried About Running Out of Food in the Last Year: Never true    Ran Out of Food in the Last Year: Never true  Transportation Needs: No Transportation Needs (05/24/2024)   PRAPARE - Administrator, Civil Service (Medical): No    Lack of Transportation (Non-Medical): No  Physical Activity: Not on file  Stress: Not on file  Social Connections: Moderately Integrated (05/24/2024)   Social Connection and Isolation Panel    Frequency of Communication with Friends and Family: More than three times a week    Frequency of Social  Gatherings with Friends and Family: Three times a week    Attends Religious Services: More than 4 times per year    Active Member of Clubs or Organizations: No    Attends Banker Meetings: Never    Marital Status: Married  Catering manager Violence: Not At Risk (05/24/2024)   Humiliation, Afraid, Rape, and Kick questionnaire    Fear of Current or Ex-Partner: No    Emotionally Abused: No    Physically Abused: No    Sexually Abused: No    Functional Status Survey:    Family History  Problem Relation Age of Onset   Cancer Mother    Multiple sclerosis Father     Health Maintenance  Topic Date Due   Zoster Vaccines- Shingrix (1 of 2) 10/22/1951   Pneumococcal Vaccine: 50+ Years (2 of 2 - PPSV23, PCV20, or PCV21) 11/27/2016   COVID-19 Vaccine (3 - Pfizer risk series) 01/12/2020   INFLUENZA VACCINE  06/20/2024   Medicare Annual Wellness (AWV)  11/05/2024   DTaP/Tdap/Td (3 - Td or Tdap) 09/10/2031    Hepatitis B Vaccines  Aged Out   HPV VACCINES  Aged Out   Meningococcal B Vaccine  Aged Out    Allergies  Allergen Reactions   Cephalexin  Diarrhea    C.diff   Hydrocodone-Acetaminophen  Nausea Only    Vomiting/dizziness    Outpatient Encounter Medications as of 06/11/2024  Medication Sig   acetaminophen  (TYLENOL ) 500 MG tablet Take 500 mg by mouth every 6 (six) hours as needed for mild pain. Reported on 01/24/2016   amLODipine  (NORVASC ) 5 MG tablet Take 1 tablet (5 mg total) by mouth daily.   aspirin  EC 81 MG tablet Take 1 tablet (81 mg total) by mouth daily. Swallow whole.   Cholecalciferol  (VITAMIN D3) 250 MCG (10000 UT) capsule Take 10,000 Units by mouth daily.   citalopram  (CELEXA ) 20 MG tablet Take 20 mg by mouth daily.   dorzolamide  (TRUSOPT ) 2 % ophthalmic solution Place 1 drop into the right eye 2 (two) times daily.   latanoprost  (XALATAN ) 0.005 % ophthalmic solution Place 1 drop into the right eye at bedtime.   lisinopril  (ZESTRIL ) 20 MG tablet Take 20 mg by mouth daily.   mirabegron  ER (MYRBETRIQ ) 25 MG TB24 tablet Take 25 mg by mouth daily.   omeprazole  (PRILOSEC) 40 MG capsule TAKE 1 CAPSULE BY MOUTH DAILY USUALLY 30 MINUTES BEFORE BREAKFAST   pravastatin  (PRAVACHOL ) 20 MG tablet Take 20 mg by mouth at bedtime.    propranolol  (INDERAL ) 40 MG tablet Take 1 tablet by mouth 2 (two) times daily.   [Paused] eltrombopag  (PROMACTA ) 25 MG tablet TAKE 1 TABLET (25 MG TOTAL) BY MOUTH EVERY OTHER DAY. TAKE ON AN EMPTY STOMACH, 1 HOUR BEFORE A MEAL OR 2 HOURS AFTER. (Patient not taking: Reported on 06/11/2024)   [Paused] mycophenolate  (CELLCEPT ) 500 MG tablet Take 1 tablet (500 mg total) by mouth daily. (Patient not taking: Reported on 06/11/2024)   No facility-administered encounter medications on file as of 06/11/2024.    Review of Systems  Vitals:   06/11/24 0833  BP: 104/65  Pulse: 80  Resp: 10  Temp: (!) 97.4 F (36.3 C)  SpO2: 94%  Weight: 165 lb 12.8 oz (75.2 kg)  Height:  5' 7 (1.702 m)   Body mass index is 25.97 kg/m. Physical Exam Cardiovascular:     Rate and Rhythm: Normal rate.     Pulses: Normal pulses.  Pulmonary:     Effort: Pulmonary effort is normal.  Breath sounds: Normal breath sounds.  Abdominal:     General: Abdomen is flat.     Palpations: Abdomen is soft.  Musculoskeletal:     Comments: Right knee decreased ROM. RUE soft tissue swelling  Skin:    Comments: Right hand suture line c/d/i  Neurological:     Mental Status: He is alert. He is disoriented.     Labs reviewed: Basic Metabolic Panel: Recent Labs    06/05/24 0426 06/06/24 0426 06/07/24 0523 06/08/24 0453 06/09/24 0502  NA 133* 129* 134* 132* 132*  K 3.4* 4.1 4.0 3.7 3.7  CL 104 99 105 102 98  CO2 22 21* 22 23 25   GLUCOSE 87 92 95 96 106*  BUN 17 18 19 17 14   CREATININE 0.71 0.63 0.82 0.66 0.72  CALCIUM 8.6* 8.4* 8.5* 8.2* 8.7*  MG  --  1.9  --   --   --   PHOS 2.9  --   --   --   --    Liver Function Tests: Recent Labs    04/28/24 1311 05/25/24 0513 06/01/24 0121  AST 22 21 27   ALT 18 21 24   ALKPHOS 86 91 95  BILITOT 0.8 0.7 0.8  PROT 6.9 7.0 6.9  ALBUMIN 3.4* 3.4* 2.7*   No results for input(s): LIPASE, AMYLASE in the last 8760 hours. No results for input(s): AMMONIA in the last 8760 hours. CBC: Recent Labs    05/28/24 0352 05/29/24 0325 05/30/24 0541 06/01/24 0121 06/03/24 0909 06/07/24 0523 06/08/24 0453 06/09/24 0502  WBC 14.2* 14.7*   < > 10.9*   < > 9.5 13.2* 13.2*  NEUTROABS 11.7* 11.8*  --  7.8*  --   --   --   --   HGB 12.3* 12.0*   < > 12.0*   < > 10.9* 11.3* 11.1*  HCT 35.6* 34.8*   < > 34.3*   < > 31.0* 32.0* 32.1*  MCV 98.9 97.8   < > 97.4   < > 97.5 96.4 97.9  PLT 258 273   < > 331   < > 528* 554* 616*   < > = values in this interval not displayed.   Cardiac Enzymes: No results for input(s): CKTOTAL, CKMB, CKMBINDEX, TROPONINI in the last 8760 hours. BNP: Invalid input(s): POCBNP No results found  for: HGBA1C Lab Results  Component Value Date   TSH 0.900 06/06/2024   No results found for: VITAMINB12 No results found for: FOLATE Lab Results  Component Value Date   IRON 76 05/14/2018   TIBC 308 05/14/2018   FERRITIN 35 05/14/2018    Imaging and Procedures obtained prior to SNF admission: ECHOCARDIOGRAM COMPLETE Result Date: 06/08/2024    ECHOCARDIOGRAM REPORT   Patient Name:   [name]. Date of Exam: 06/08/2024 Medical Rec #:  [id number]            Height:       67.0 in Accession #:    [id number]           Weight:       167.0 lb Date of Birth:  August 30, 1932            BSA:          1.873 m Patient Age:    89 years             BP:           168/81 mmHg Patient Gender: M  HR:           81 bpm. Exam Location:  [facility] Procedure: 2D Echo, Color Doppler and Cardiac Doppler (Both Spectral and Color            Flow Doppler were utilized during procedure). Indications:     R01.1 Murmur  History:         Patient has no prior history of Echocardiogram examinations.                  History of Prostate CA; Risk Factors:Hypertension and                  Dyslipidemia.  Sonographer:     L. Thornton-Maynard, RDCS Referring Phys:  [id number] AIDA CHO Diagnosing Phys: Cara JONETTA Lovelace MD IMPRESSIONS  1. TDS.  2. Left ventricular ejection fraction, by estimation, is 60 to 65%. The left ventricle has normal function. The left ventricle has no regional wall motion abnormalities. There is moderate concentric left ventricular hypertrophy. Left ventricular diastolic parameters are consistent with Grade I diastolic dysfunction (impaired relaxation).  3. Right ventricular systolic function is normal. The right ventricular size is normal.  4. The mitral valve is normal in structure. Trivial mitral valve regurgitation.  5. The aortic valve is normal in structure. Aortic valve regurgitation is not visualized. Conclusion(s)/Recommendation(s): Poor windows for evaluation of left ventricular  function by transthoracic echocardiography. Would recommend an alternative means of evaluation. FINDINGS  Left Ventricle: Left ventricular ejection fraction, by estimation, is 60 to 65%. The left ventricle has normal function. The left ventricle has no regional wall motion abnormalities. Strain was performed and the global longitudinal strain is indeterminate. The left ventricular internal cavity size was normal in size. There is moderate concentric left ventricular hypertrophy. Left ventricular diastolic parameters are consistent with Grade I diastolic dysfunction (impaired relaxation). Right Ventricle: The right ventricular size is normal. No increase in right ventricular wall thickness. Right ventricular systolic function is normal. Left Atrium: Left atrial size was normal in size. Right Atrium: Right atrial size was normal in size. Pericardium: There is no evidence of pericardial effusion. Mitral Valve: The mitral valve is normal in structure. Trivial mitral valve regurgitation. Tricuspid Valve: The tricuspid valve is normal in structure. Tricuspid valve regurgitation is mild. Aortic Valve: The aortic valve is normal in structure. Aortic valve regurgitation is not visualized. Aortic valve mean gradient measures 16.0 mmHg. Aortic valve peak gradient measures 27.2 mmHg. Aortic valve area, by VTI measures 2.02 cm. Pulmonic Valve: The pulmonic valve was normal in structure. Pulmonic valve regurgitation is not visualized. Aorta: The ascending aorta was not well visualized. IAS/Shunts: No atrial level shunt detected by color flow Doppler. Additional Comments: TDS. 3D was performed not requiring image post processing on an independent workstation and was indeterminate.  LEFT VENTRICLE PLAX 2D LVIDd:         4.30 cm     Diastology LVIDs:         2.90 cm     LV e' medial:    5.55 cm/s LV PW:         1.60 cm     LV E/e' medial:  15.3 LV IVS:        1.60 cm     LV e' lateral:   6.53 cm/s LVOT diam:     2.40 cm     LV E/e'  lateral: 13.0 LV SV:         100 LV SV Index:   53 LVOT  Area:     4.52 cm  LV Volumes (MOD) LV vol d, MOD A2C: 55.0 ml LV vol d, MOD A4C: 86.0 ml LV vol s, MOD A2C: 17.8 ml LV vol s, MOD A4C: 35.9 ml LV SV MOD A2C:     37.2 ml LV SV MOD A4C:     86.0 ml LV SV MOD BP:      45.5 ml RIGHT VENTRICLE RV Basal diam:  3.50 cm RV S prime:     18.20 cm/s TAPSE (M-mode): 1.8 cm LEFT ATRIUM             Index        RIGHT ATRIUM           Index LA diam:        3.20 cm 1.71 cm/m   RA Area:     17.70 cm LA Vol (A2C):   57.6 ml 30.75 ml/m  RA Volume:   47.60 ml  25.41 ml/m LA Vol (A4C):   64.7 ml 34.54 ml/m LA Biplane Vol: 63.7 ml 34.01 ml/m  AORTIC VALVE                     PULMONIC VALVE AV Area (Vmax):    1.85 cm      PV Vmax:       1.01 m/s AV Area (Vmean):   1.79 cm      PV Peak grad:  4.1 mmHg AV Area (VTI):     2.02 cm AV Vmax:           261.00 cm/s AV Vmean:          185.000 cm/s AV VTI:            0.495 m AV Peak Grad:      27.2 mmHg AV Mean Grad:      16.0 mmHg LVOT Vmax:         107.00 cm/s LVOT Vmean:        73.400 cm/s LVOT VTI:          0.221 m LVOT/AV VTI ratio: 0.45  AORTA Ao Root diam: 3.40 cm Ao Asc diam:  3.20 cm MITRAL VALVE MV Area (PHT): 3.65 cm     SHUNTS MV Decel Time: 208 msec     Systemic VTI:  0.22 m MV E velocity: 84.90 cm/s   Systemic Diam: 2.40 cm MV A velocity: 102.00 cm/s MV E/A ratio:  0.83 Dwayne D Callwood MD Electronically signed by Cara JONETTA Lovelace MD Signature Date/Time: 06/08/2024/2:12:46 PM    Final     Assessment/Plan Pneumonia Recent pneumonia during hospitalization, now resolved. No current respiratory symptoms.  Delirium Delirium likely due to prolonged hospitalization and severe illness, exhibiting both hyperactive and hypoactive features. Orientation and cognitive function expected to improve with time and routine, though physical strength may return before cognitive clarity. - Encourage participation in daily activities and meals to improve orientation - Monitor  sleep-wake cycle and consider melatonin if needed - Provide reorientation cues regularly  Right little finger tenosynovitis Hx of abscess in the right finger s/p debridement. Sutures removed on 7/18. Keep arm elevated as tolerated when at rest.   Memory loss Memory loss possibly exacerbated by recent severe illness and hospitalization, with suspected underlying issues prior to hospitalization. Severe illness can unmask underlying memory problems, and recovery may not return to baseline. - Monitor cognitive function and memory - Encourage engagement in cognitive activities  Hypertension Hypertension with recent episodes of high and  low blood pressure. Current blood pressure is low, possibly due to recent illness and hospitalization. Risk of orthostatic hypotension noted. - Monitor blood pressure three times daily - Consider holding or adjusting antihypertensive medications if blood pressure remains low  Essential tremor Essential tremor affecting ability to eat. Previously on propranolol , but current status of medication not discussed.  Knee problems Knee problems with recent bowing and instability. Previous knee replacement noted. Concern for complications with progress with physical therapy and recovery given limited mobility. Recent falls likely due to knee instability and balance issues. - Engage in physical therapy to improve strength and stability  History of falls Falls likely due to knee instability and balance issues. - Engage in physical therapy to improve balance and prevent falls - Consider use of walker for stability  ITP (Immune thrombocytopenic purpura) ITP with previously low platelet counts. Recent platelet count elevated during hospitalization, now on aspirin  therapy. Platelet count and blood status need monitoring due to recent changes. - Check CBC and BMP tomorrow morning to monitor platelet count and overall blood status  Prosthetic eye Prosthetic left eye with  occasional discharge. No acute issues. - Use moisture drops as needed for prosthetic eye  Glaucoma Glaucoma managed with eye drops. No acute issues. - Continue current eye drop regimen  Goals of Care Discussed with patient desire for chest compressions in the event he is without a pulse, and she states, she would not want chest compressions. She is undecided of whether or not he should return to the hospital. She understands she is HCPOA and if he becomes oriented to self, place, time, he may regain capacity to make decisions for himself. Will continue to monitor. Honor desire to become DNR>   Family/ staff Communication: Spouse, nursing  Labs/tests ordered: CBC, BMP

## 2024-06-12 ENCOUNTER — Encounter: Payer: Self-pay | Admitting: Internal Medicine

## 2024-06-16 ENCOUNTER — Encounter: Payer: Self-pay | Admitting: Internal Medicine

## 2024-06-18 ENCOUNTER — Telehealth: Payer: Self-pay | Admitting: *Deleted

## 2024-06-18 ENCOUNTER — Telehealth: Payer: Self-pay | Admitting: Internal Medicine

## 2024-06-18 NOTE — Telephone Encounter (Signed)
 Spoke with wife, stated she got Dr Damaris message and he addressed her concerns.

## 2024-06-18 NOTE — Telephone Encounter (Signed)
 I reviewed the recent labs-thrombocytosis; mild anemia mild leukocytosis-not unexpected of pneumonia.  Called patient's wife-unable to reach left voicemail.   Okay to cancel the labs on August 5 th-as patient is rehab right now.  Keep other appointment as planned.  FYI- Dr.Beamer.

## 2024-06-19 LAB — BASIC METABOLIC PANEL WITH GFR
BUN: 10 (ref 4–21)
CO2: 27 — AB (ref 13–22)
Chloride: 95 — AB (ref 99–108)
Creatinine: 0.8 (ref 0.6–1.3)
Glucose: 89
Potassium: 4.6 meq/L (ref 3.5–5.1)
Sodium: 130 — AB (ref 137–147)

## 2024-06-19 LAB — CBC AND DIFFERENTIAL
HCT: 34 — AB (ref 41–53)
Hemoglobin: 11.2 — AB (ref 13.5–17.5)
Neutrophils Absolute: 10062
Platelets: 619 K/uL — AB (ref 150–400)
WBC: 12.9

## 2024-06-19 LAB — CBC: RBC: 3.35 — AB (ref 3.87–5.11)

## 2024-06-19 LAB — COMPREHENSIVE METABOLIC PANEL WITH GFR
Calcium: 8.9 (ref 8.7–10.7)
eGFR: 86

## 2024-06-20 ENCOUNTER — Telehealth: Payer: Self-pay | Admitting: Internal Medicine

## 2024-06-20 NOTE — Telephone Encounter (Signed)
 Pt spouse called to ask for fax number - wanted to have lab from different facility faxed to Dr B - gave her fax number - [phone] [facility]

## 2024-06-22 LAB — CBC AND DIFFERENTIAL
HCT: 32 — AB (ref 41–53)
Hemoglobin: 10.7 — AB (ref 13.5–17.5)
Neutrophils Absolute: 11016
Platelets: 576 K/uL — AB (ref 150–400)
WBC: 14.4

## 2024-06-22 LAB — CBC: RBC: 3.19 — AB (ref 3.87–5.11)

## 2024-06-23 ENCOUNTER — Non-Acute Institutional Stay (SKILLED_NURSING_FACILITY): Payer: Self-pay | Admitting: Student

## 2024-06-23 ENCOUNTER — Encounter: Payer: Self-pay | Admitting: Student

## 2024-06-23 DIAGNOSIS — M65949 Unspecified synovitis and tenosynovitis, unspecified hand: Secondary | ICD-10-CM

## 2024-06-23 NOTE — Progress Notes (Unsigned)
 Location:  Other [facility]) Nursing Home Room Number: 112 A Place of Service:  SNF [phone]) Provider:  Richerd MYRTIS Bennett, M.D.  Patient Care Team: Keith Lame, MD as PCP - General (Family Medicine) Keith Cindy SAUNDERS, MD as Consulting Physician (Oncology)  Extended Emergency Contact Information Primary Emergency Contact: [name]  [place] Home Phone: [phone] Mobile Phone: [phone] Relation: Spouse Secondary Emergency Contact: [name]  [place] Home Phone: [phone] Relation: Son  Code Status:  DNR Goals of care: Advanced Directive information    06/03/2024   10:49 AM  Advanced Directives  Does Patient Have a Medical Advance Directive? Yes  Type of Estate agent of [place];Living will     Chief Complaint  Patient presents with   Edema    Hand swelling     HPI:  Pt is a 89 y.o. male seen today for an acute visit for hand swelling.  History of Present Illness The patient presents with a tender and warm right hand following an infection and debridement for right pinky tenosynovitis.  He reports tenderness in his right hand. He started antibiotics over the weekend. The infection was previously treated with debridement.  He has a history of right pinky tenosynovitis, which required surgical intervention. He has not yet seen his surgeon for follow-up after the procedure.   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   Leukocytosis    Prostate cancer (HCC)    Shingles    Past Surgical History:  Procedure Laterality Date   APPENDECTOMY     artificial left eye     EYE SURGERY     FLEXOR TENDON REPAIR Right 05/24/2024   Procedure: RIGHT FIFTH FINGER TENDON FLEXOR IRRIGATION AND DEBRIDEMENT;  Surgeon: Keith Priest, MD;  Location: [facility] ORS;  Service: Orthopedics;  Laterality: Right;   HERNIA REPAIR     JOINT REPLACEMENT     PROSTATE SURGERY      Allergies  Allergen Reactions   Cephalexin  Diarrhea    C.diff   Hydrocodone-Acetaminophen  Nausea Only    Vomiting/dizziness    Outpatient Encounter Medications as of 06/23/2024  Medication Sig   acetaminophen  (TYLENOL ) 500 MG tablet Take 500 mg by mouth every 6 (six) hours as needed for mild pain. Reported on 01/24/2016   aspirin  EC 81 MG tablet Take 1 tablet (81 mg total) by mouth daily. Swallow whole.   Cholecalciferol  (VITAMIN D3) 250 MCG (10000 UT) capsule Take 10,000 Units by mouth daily.   citalopram  (CELEXA ) 20 MG tablet Take 20 mg by mouth daily.   dorzolamide  (TRUSOPT ) 2 % ophthalmic solution Place 1 drop into the right eye 2 (two) times daily.   doxycycline (ADOXA) 100 MG tablet Take 100 mg by mouth 2 (two) times daily. For skin infection to right hand x 7 days   latanoprost  (XALATAN ) 0.005 % ophthalmic solution Place 1 drop into the right eye at bedtime.   lisinopril  (ZESTRIL ) 20 MG tablet Take 20 mg by mouth daily.   mirabegron  ER (MYRBETRIQ ) 25 MG TB24 tablet Take 25 mg by mouth daily.   omeprazole  (PRILOSEC) 40 MG capsule TAKE 1 CAPSULE BY MOUTH DAILY USUALLY 30 MINUTES BEFORE BREAKFAST   polyethylene glycol (MIRALAX  / GLYCOLAX ) 17 g packet Take 17 g by mouth every 12 (twelve) hours  as needed.   pravastatin  (PRAVACHOL ) 20 MG tablet Take 20 mg by mouth at bedtime.    propranolol  (INDERAL ) 40 MG tablet Take 1 tablet by mouth 2 (two) times daily.   amLODipine  (NORVASC ) 5 MG tablet Take 1 tablet (5 mg total) by mouth daily. (Patient not taking: Reported on 06/23/2024)   [Paused] eltrombopag  (PROMACTA ) 25 MG tablet TAKE 1 TABLET (25 MG TOTAL) BY MOUTH EVERY OTHER DAY. TAKE ON AN EMPTY STOMACH, 1 HOUR BEFORE A MEAL OR 2 HOURS AFTER. (Patient not taking: Reported on 06/23/2024)   [Paused] mycophenolate  (CELLCEPT ) 500 MG tablet Take 1 tablet (500 mg  total) by mouth daily. (Patient not taking: Reported on 06/23/2024)   No facility-administered encounter medications on file as of 06/23/2024.    Review of Systems  Immunization History  Administered Date(s) Administered   Influenza Split 08/27/2014   Influenza Whole 07/21/2016   Influenza, High Dose Seasonal PF 09/05/2021   Influenza,inj,Quad PF,6+ Mos 09/01/2015   Influenza-Unspecified 09/03/2012, 08/20/2013, 08/27/2014, 09/12/2016, 08/27/2017, 08/10/2020, 08/31/2022   PFIZER(Purple Top)SARS-COV-2 Vaccination 11/24/2019, 12/15/2019   Pfizer(Comirnaty)Fall Seasonal Vaccine 12 years and older 08/29/2023   Pneumococcal Conjugate-13 10/02/2016   Td 09/09/2021   Tdap 05/14/2013   Zoster, Live 12/12/2012   Pertinent  Health Maintenance Due  Topic Date Due   INFLUENZA VACCINE  06/20/2024      09/02/2021    2:52 PM 09/08/2021    6:37 PM 12/23/2021    2:51 PM 05/09/2022    2:25 PM 08/29/2022    2:00 PM  Fall Risk  (RETIRED) Patient Fall Risk Level Low fall risk  Low fall risk  Low fall risk  High fall risk  Low fall risk      Data saved with a previous flowsheet row definition   Functional Status Survey:    Vitals:   06/23/24 0915  BP: 125/63  Pulse: 74  Temp: 98.3 F (36.8 C)  SpO2: 95%  Weight: 163 lb 4.8 oz (74.1 kg)  Height: 5' 7 (1.702 m)   Body mass index is 25.58 kg/m. Physical Exam EXTREMITIES: Right hand warm to touch and tender. Labs reviewed: Recent Labs    06/05/24 0426 06/06/24 0426 06/07/24 0523 06/08/24 0453 06/09/24 0502 06/19/24 0000  NA 133* 129* 134* 132* 132* 130*  K 3.4* 4.1 4.0 3.7 3.7 4.6  CL 104 99 105 102 98 95*  CO2 22 21* 22 23 25  27*  GLUCOSE 87 92 95 96 106*  --   BUN 17 18 19 17 14 10   CREATININE 0.71 0.63 0.82 0.66 0.72 0.8  CALCIUM 8.6* 8.4* 8.5* 8.2* 8.7* 8.9  MG  --  1.9  --   --   --   --   PHOS 2.9  --   --   --   --   --    Recent Labs    04/28/24 1311 05/25/24 0513 06/01/24 0121  AST 22 21 27   ALT 18 21 24    ALKPHOS 86 91 95  BILITOT 0.8 0.7 0.8  PROT 6.9 7.0 6.9  ALBUMIN 3.4* 3.4* 2.7*   Recent Labs    06/01/24 0121 06/03/24 0909 06/07/24 0523 06/08/24 0453 06/09/24 0502 06/19/24 0000 06/22/24 0000  WBC 10.9*   < > 9.5 13.2* 13.2* 12.9 14.4  NEUTROABS 7.8*  --   --   --   --  10,062.00 11,016.00  HGB 12.0*   < > 10.9* 11.3* 11.1* 11.2* 10.7*  HCT 34.3*   < > 31.0* 32.0*  32.1* 34* 32*  MCV 97.4   < > 97.5 96.4 97.9  --   --   PLT 331   < > 528* 554* 616* 619* 576*   < > = values in this interval not displayed.   Lab Results  Component Value Date   TSH 0.900 06/06/2024   No results found for: HGBA1C Lab Results  Component Value Date   CHOL 131 12/08/2014   HDL 47 12/08/2014   LDLCALC 65 12/08/2014   TRIG 93 12/08/2014    Significant Diagnostic Results in last 30 days:  ECHOCARDIOGRAM COMPLETE Result Date: 06/08/2024    ECHOCARDIOGRAM REPORT   Patient Name:   [name]. Date of Exam: 06/08/2024 Medical Rec #:  [id number]            Height:       67.0 in Accession #:    [id number]           Weight:       167.0 lb Date of Birth:  10/21/1932            BSA:          1.873 m Patient Age:    89 years             BP:           168/81 mmHg Patient Gender: M                    HR:           81 bpm. Exam Location:  [facility] Procedure: 2D Echo, Color Doppler and Cardiac Doppler (Both Spectral and Color            Flow Doppler were utilized during procedure). Indications:     R01.1 Murmur  History:         Patient has no prior history of Echocardiogram examinations.                  History of Prostate CA; Risk Factors:Hypertension and                  Dyslipidemia.  Sonographer:     L. Thornton-Maynard, RDCS Referring Phys:  [id number] AIDA CHO Diagnosing Phys: Cara JONETTA Lovelace MD IMPRESSIONS  1. TDS.  2. Left ventricular ejection fraction, by estimation, is 60 to 65%. The left ventricle has normal function. The left ventricle has no regional wall motion abnormalities. There is  moderate concentric left ventricular hypertrophy. Left ventricular diastolic parameters are consistent with Grade I diastolic dysfunction (impaired relaxation).  3. Right ventricular systolic function is normal. The right ventricular size is normal.  4. The mitral valve is normal in structure. Trivial mitral valve regurgitation.  5. The aortic valve is normal in structure. Aortic valve regurgitation is not visualized. Conclusion(s)/Recommendation(s): Poor windows for evaluation of left ventricular function by transthoracic echocardiography. Would recommend an alternative means of evaluation. FINDINGS  Left Ventricle: Left ventricular ejection fraction, by estimation, is 60 to 65%. The left ventricle has normal function. The left ventricle has no regional wall motion abnormalities. Strain was performed and the global longitudinal strain is indeterminate. The left ventricular internal cavity size was normal in size. There is moderate concentric left ventricular hypertrophy. Left ventricular diastolic parameters are consistent with Grade I diastolic dysfunction (impaired relaxation). Right Ventricle: The right ventricular size is normal. No increase in right ventricular wall thickness. Right ventricular systolic function is normal. Left Atrium: Left atrial size was normal in  size. Right Atrium: Right atrial size was normal in size. Pericardium: There is no evidence of pericardial effusion. Mitral Valve: The mitral valve is normal in structure. Trivial mitral valve regurgitation. Tricuspid Valve: The tricuspid valve is normal in structure. Tricuspid valve regurgitation is mild. Aortic Valve: The aortic valve is normal in structure. Aortic valve regurgitation is not visualized. Aortic valve mean gradient measures 16.0 mmHg. Aortic valve peak gradient measures 27.2 mmHg. Aortic valve area, by VTI measures 2.02 cm. Pulmonic Valve: The pulmonic valve was normal in structure. Pulmonic valve regurgitation is not visualized.  Aorta: The ascending aorta was not well visualized. IAS/Shunts: No atrial level shunt detected by color flow Doppler. Additional Comments: TDS. 3D was performed not requiring image post processing on an independent workstation and was indeterminate.  LEFT VENTRICLE PLAX 2D LVIDd:         4.30 cm     Diastology LVIDs:         2.90 cm     LV e' medial:    5.55 cm/s LV PW:         1.60 cm     LV E/e' medial:  15.3 LV IVS:        1.60 cm     LV e' lateral:   6.53 cm/s LVOT diam:     2.40 cm     LV E/e' lateral: 13.0 LV SV:         100 LV SV Index:   53 LVOT Area:     4.52 cm  LV Volumes (MOD) LV vol d, MOD A2C: 55.0 ml LV vol d, MOD A4C: 86.0 ml LV vol s, MOD A2C: 17.8 ml LV vol s, MOD A4C: 35.9 ml LV SV MOD A2C:     37.2 ml LV SV MOD A4C:     86.0 ml LV SV MOD BP:      45.5 ml RIGHT VENTRICLE RV Basal diam:  3.50 cm RV S prime:     18.20 cm/s TAPSE (M-mode): 1.8 cm LEFT ATRIUM             Index        RIGHT ATRIUM           Index LA diam:        3.20 cm 1.71 cm/m   RA Area:     17.70 cm LA Vol (A2C):   57.6 ml 30.75 ml/m  RA Volume:   47.60 ml  25.41 ml/m LA Vol (A4C):   64.7 ml 34.54 ml/m LA Biplane Vol: 63.7 ml 34.01 ml/m  AORTIC VALVE                     PULMONIC VALVE AV Area (Vmax):    1.85 cm      PV Vmax:       1.01 m/s AV Area (Vmean):   1.79 cm      PV Peak grad:  4.1 mmHg AV Area (VTI):     2.02 cm AV Vmax:           261.00 cm/s AV Vmean:          185.000 cm/s AV VTI:            0.495 m AV Peak Grad:      27.2 mmHg AV Mean Grad:      16.0 mmHg LVOT Vmax:         107.00 cm/s LVOT Vmean:        73.400 cm/s LVOT  VTI:          0.221 m LVOT/AV VTI ratio: 0.45  AORTA Ao Root diam: 3.40 cm Ao Asc diam:  3.20 cm MITRAL VALVE MV Area (PHT): 3.65 cm     SHUNTS MV Decel Time: 208 msec     Systemic VTI:  0.22 m MV E velocity: 84.90 cm/s   Systemic Diam: 2.40 cm MV A velocity: 102.00 cm/s MV E/A ratio:  0.83 Dwayne D Callwood MD Electronically signed by Cara JONETTA Lovelace MD Signature Date/Time:  06/08/2024/2:12:46 PM    Final    DG Chest Portable 1 View Result Date: 06/03/2024 CLINICAL DATA:  Fever and hypertension EXAM: PORTABLE CHEST 1 VIEW COMPARISON:  05/29/2024 FINDINGS: Apical lordotic portable AP radiograph. Midline trachea. Cardiomegaly accentuated by AP portable technique. Atherosclerosis in the transverse aorta. Possible trace left pleural fluid. No pneumothorax. Low lung volumes. Interstitial coarsening is nonspecific and accentuated by AP portable technique. Increased left base airspace disease. Minimal right base subsegmental atelectasis or scar. IMPRESSION: Worsened left lower lobe airspace disease, favoring infection or aspiration. Aortic Atherosclerosis (ICD10-I70.0). Electronically Signed   By: Rockey Kilts M.D.   On: 06/03/2024 09:09   DG Chest Port 1 View Result Date: 05/29/2024 CLINICAL DATA:  Leukocytosis.  History prostate cancer. EXAM: PORTABLE CHEST 1 VIEW COMPARISON:  12/08/2014 FINDINGS: Atherosclerotic calcification of the aortic arch. Confluent accentuated linear markings at the left lung base could be from atelectasis or early bronchopneumonia. The lungs appear otherwise clear. No blunting of the costophrenic angles. Heart size within normal limits. IMPRESSION: 1. Confluent accentuated linear markings at the left lung base could be from atelectasis or early bronchopneumonia. 2. Aortic Atherosclerosis (ICD10-I70.0). Electronically Signed   By: Ryan Salvage M.D.   On: 05/29/2024 16:42   DG Foot Complete Right Result Date: 05/29/2024 CLINICAL DATA:  [id number] Right foot pain [id number] EXAM: RIGHT FOOT COMPLETE - 3+ VIEW COMPARISON:  None Available. FINDINGS: No acute fracture or dislocation. There is no evidence of arthropathy or other focal bone abnormality. Soft tissues are unremarkable. Peripheral vascular atherosclerosis. IMPRESSION: No acute fracture or dislocation. Electronically Signed   By: Rogelia Myers M.D.   On: 05/29/2024 16:40   DG Hand Complete  Right Result Date: 05/24/2024 CLINICAL DATA:  Fifth finger swelling since awakening yesterday. EXAM: RIGHT HAND - COMPLETE 3+ VIEW COMPARISON:  Wrist radiographs 09/08/2021. FINDINGS: No evidence of acute fracture, dislocation or acute bone destruction. Incomplete healing of previously demonstrated fracture of the radial styloid. There are mild-to-moderate degenerative changes at the proximal interphalangeal joint of the small finger. Questionable erosion of the neck of the 5th proximal phalanx. Additional degenerative changes within the wrist and at the 1st carpometacarpal joint. There are scattered capsular calcifications as well as TFCC calcification. Soft tissue swelling noted in the small finger without evidence of foreign body or soft tissue emphysema. IMPRESSION: 1. Nonspecific soft tissue swelling in the small finger without evidence of foreign body or acute bone destruction. 2. Degenerative changes at the proximal interphalangeal joint of the small finger with questionable erosion of the neck of the 5th proximal phalanx. 3. Incomplete healing of previously demonstrated fracture of the radial styloid. Electronically Signed   By: Elsie Perone M.D.   On: 05/24/2024 12:44    Assessment/Plan Tenosynovitis Ongoing infection in the right hand with warmth and redness, status post debridement for right pinky tenosynovitis. Currently on doxycycline. - Continue doxycycline. - Advise elevation of the hand to reduce swelling and promote healing. - Monitor for improvement in  symptoms and signs of infection  Family/ staff Communication: nursing  Labs/tests ordered:  none

## 2024-06-24 ENCOUNTER — Inpatient Hospital Stay

## 2024-06-24 ENCOUNTER — Encounter: Payer: Self-pay | Admitting: Student

## 2024-06-30 ENCOUNTER — Non-Acute Institutional Stay (SKILLED_NURSING_FACILITY): Payer: Self-pay | Admitting: Student

## 2024-06-30 DIAGNOSIS — R339 Retention of urine, unspecified: Secondary | ICD-10-CM

## 2024-06-30 DIAGNOSIS — D590 Drug-induced autoimmune hemolytic anemia: Secondary | ICD-10-CM

## 2024-06-30 DIAGNOSIS — M65949 Unspecified synovitis and tenosynovitis, unspecified hand: Secondary | ICD-10-CM | POA: Diagnosis not present

## 2024-06-30 DIAGNOSIS — K5900 Constipation, unspecified: Secondary | ICD-10-CM

## 2024-06-30 DIAGNOSIS — R972 Elevated prostate specific antigen [PSA]: Secondary | ICD-10-CM | POA: Diagnosis not present

## 2024-07-02 LAB — TSH: TSH: 0.81 (ref 0.41–5.90)

## 2024-07-03 ENCOUNTER — Encounter: Payer: Self-pay | Admitting: Student

## 2024-07-03 DIAGNOSIS — C61 Malignant neoplasm of prostate: Secondary | ICD-10-CM | POA: Diagnosis not present

## 2024-07-03 DIAGNOSIS — D693 Immune thrombocytopenic purpura: Secondary | ICD-10-CM | POA: Diagnosis not present

## 2024-07-03 DIAGNOSIS — R339 Retention of urine, unspecified: Secondary | ICD-10-CM | POA: Diagnosis not present

## 2024-07-03 LAB — CBC AND DIFFERENTIAL
HCT: 36 — AB (ref 41–53)
Hemoglobin: 11.6 — AB (ref 13.5–17.5)
Neutrophils Absolute: 7151
Platelets: 571 K/uL — AB (ref 150–400)
WBC: 10.1

## 2024-07-03 LAB — BASIC METABOLIC PANEL WITH GFR
BUN: 14 (ref 4–21)
CO2: 29 — AB (ref 13–22)
Chloride: 98 — AB (ref 99–108)
Creatinine: 0.6 (ref 0.6–1.3)
Glucose: 73
Potassium: 4.1 meq/L (ref 3.5–5.1)
Sodium: 136 — AB (ref 137–147)

## 2024-07-03 LAB — CBC: RBC: 3.53 — AB (ref 3.87–5.11)

## 2024-07-03 LAB — COMPREHENSIVE METABOLIC PANEL WITH GFR
Calcium: 8.8 (ref 8.7–10.7)
eGFR: 91

## 2024-07-03 NOTE — Progress Notes (Signed)
 Location:  [facility] Home Room Number: 112A Place of Service:  SNF (31) Provider:  Abdul Sayre, Jerona, MD  Patient Care Team: Sayre Jerona, MD as PCP - General (Family Medicine) [name], MD as Consulting Physician (Oncology)  Extended Emergency Contact Information Primary Emergency Contact: Bennett,Keith  [place] Home Phone: [phone] Mobile Phone: [phone] Relation: Spouse Secondary Emergency Contact: [name]  [place] Home Phone: [phone] Relation: Son  Code Status:  DNR Goals of care: Advanced Directive information    06/03/2024   10:49 AM  Advanced Directives  Does Patient Have a Medical Advance Directive? Yes  Type of Estate agent of [place];Living will     Chief Complaint  Patient presents with   Urinary Retention    HPI:  Pt is a 89 y.o. male seen today for an acute visit  Discussed the use of AI scribe software for clinical note transcription with the patient, who gave verbal consent to proceed.  History of Present Illness  History of Present Illness The patient is an 89 year old with prostate issues who presents with concerns about urinary retention and constipation.  He has experienced significant urinary retention over the weekend, requiring catheterization to relieve a large volume of urine. He describes a sensation of fullness and discomfort in the abdomen, which was noted to be hard and tight by a caregiver. Despite using a suppository and Miralax , he has difficulty with bowel movements, contributing to his discomfort.  He has a history of prostate issues, with a PSA level last checked in December, noted to be elevated at 24. He has missed several specialist appointments due to recent hospitalization and is concerned about rescheduling these, particularly with his urologist and eye doctor.  He has been on multiple antibiotics recently, and there is  concern about his blood counts, which have not normalized. His white blood cell count has decreased from 14 to 12.9, and his platelet count has improved to 542. He is also anemic with a hemoglobin level of 10.  He is frustrated with his current rehabilitation program, noting that he is only able to walk with therapists and has not been able to participate in other activities due to his condition. He feels that his recovery is stagnant and is eager to improve his mobility and strength.   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   Leukocytosis    Prostate cancer (HCC)    Shingles    Past Surgical History:  Procedure Laterality Date   APPENDECTOMY     artificial left eye     EYE SURGERY     FLEXOR TENDON REPAIR Right 05/24/2024   Procedure: RIGHT FIFTH FINGER TENDON FLEXOR IRRIGATION AND DEBRIDEMENT;  Surgeon: Tobie Priest, MD;  Location: [facility] ORS;  Service: Orthopedics;  Laterality: Right;   HERNIA REPAIR     JOINT REPLACEMENT     PROSTATE SURGERY      Allergies  Allergen Reactions   Cephalexin  Diarrhea    C.diff   Hydrocodone-Acetaminophen  Nausea Only    Vomiting/dizziness    Outpatient Encounter Medications as of 06/30/2024  Medication Sig   acetaminophen  (TYLENOL ) 500 MG tablet Take 500 mg by mouth every 6 (six) hours as needed for mild pain. Reported on 01/24/2016   amLODipine  (  NORVASC ) 5 MG tablet Take 1 tablet (5 mg total) by mouth daily. (Patient not taking: Reported on 06/23/2024)   aspirin  EC 81 MG tablet Take 1 tablet (81 mg total) by mouth daily. Swallow whole.   Cholecalciferol  (VITAMIN D3) 250 MCG (10000 UT) capsule Take 10,000 Units by mouth daily.   citalopram  (CELEXA ) 20 MG tablet Take 20 mg by mouth daily.   dorzolamide  (TRUSOPT ) 2 % ophthalmic solution Place 1 drop into the right eye 2 (two)  times daily.   doxycycline (ADOXA) 100 MG tablet Take 100 mg by mouth 2 (two) times daily. For skin infection to right hand x 7 days   [Paused] eltrombopag  (PROMACTA ) 25 MG tablet TAKE 1 TABLET (25 MG TOTAL) BY MOUTH EVERY OTHER DAY. TAKE ON AN EMPTY STOMACH, 1 HOUR BEFORE A MEAL OR 2 HOURS AFTER. (Patient not taking: Reported on 06/23/2024)   latanoprost  (XALATAN ) 0.005 % ophthalmic solution Place 1 drop into the right eye at bedtime.   lisinopril  (ZESTRIL ) 20 MG tablet Take 20 mg by mouth daily.   mirabegron  ER (MYRBETRIQ ) 25 MG TB24 tablet Take 25 mg by mouth daily.   [Paused] mycophenolate  (CELLCEPT ) 500 MG tablet Take 1 tablet (500 mg total) by mouth daily. (Patient not taking: Reported on 06/23/2024)   omeprazole  (PRILOSEC) 40 MG capsule TAKE 1 CAPSULE BY MOUTH DAILY USUALLY 30 MINUTES BEFORE BREAKFAST   polyethylene glycol (MIRALAX  / GLYCOLAX ) 17 g packet Take 17 g by mouth every 12 (twelve) hours as needed.   pravastatin  (PRAVACHOL ) 20 MG tablet Take 20 mg by mouth at bedtime.    propranolol  (INDERAL ) 40 MG tablet Take 1 tablet by mouth 2 (two) times daily.   No facility-administered encounter medications on file as of 06/30/2024.    Review of Systems  Immunization History  Administered Date(s) Administered   Influenza Split 08/27/2014   Influenza Whole 07/21/2016   Influenza, High Dose Seasonal PF 09/05/2021   Influenza,inj,Quad PF,6+ Mos 09/01/2015   Influenza-Unspecified 09/03/2012, 08/20/2013, 08/27/2014, 09/12/2016, 08/27/2017, 08/10/2020, 08/31/2022   PFIZER(Purple Top)SARS-COV-2 Vaccination 11/24/2019, 12/15/2019   Pfizer(Comirnaty)Fall Seasonal Vaccine 12 years and older 08/29/2023   Pneumococcal Conjugate-13 10/02/2016   Td 09/09/2021   Tdap 05/14/2013   Zoster, Live 12/12/2012   Pertinent  Health Maintenance Due  Topic Date Due   INFLUENZA VACCINE  06/20/2024      09/02/2021    2:52 PM 09/08/2021    6:37 PM 12/23/2021    2:51 PM 05/09/2022    2:25 PM 08/29/2022     2:00 PM  Fall Risk  (RETIRED) Patient Fall Risk Level Low fall risk  Low fall risk  Low fall risk  High fall risk  Low fall risk      Data saved with a previous flowsheet row definition   Functional Status Survey:    There were no vitals filed for this visit. There is no height or weight on file to calculate BMI. Physical Exam  Physical Exam CHEST: Lungs clear to auscultation. ABDOMEN: Abdomen soft.    Labs reviewed: Recent Labs    06/05/24 0426 06/06/24 0426 06/07/24 0523 06/08/24 0453 06/09/24 0502 06/19/24 0000  NA 133* 129* 134* 132* 132* 130*  K 3.4* 4.1 4.0 3.7 3.7 4.6  CL 104 99 105 102 98 95*  CO2 22 21* 22 23 25  27*  GLUCOSE 87 92 95 96 106*  --   BUN 17 18 19 17 14 10   CREATININE 0.71 0.63 0.82 0.66 0.72 0.8  CALCIUM 8.6* 8.4* 8.5*  8.2* 8.7* 8.9  MG  --  1.9  --   --   --   --   PHOS 2.9  --   --   --   --   --    Recent Labs    04/28/24 1311 05/25/24 0513 06/01/24 0121  AST 22 21 27   ALT 18 21 24   ALKPHOS 86 91 95  BILITOT 0.8 0.7 0.8  PROT 6.9 7.0 6.9  ALBUMIN 3.4* 3.4* 2.7*   Recent Labs    06/01/24 0121 06/03/24 0909 06/07/24 0523 06/08/24 0453 06/09/24 0502 06/19/24 0000 06/22/24 0000  WBC 10.9*   < > 9.5 13.2* 13.2* 12.9 14.4  NEUTROABS 7.8*  --   --   --   --  10,062.00 11,016.00  HGB 12.0*   < > 10.9* 11.3* 11.1* 11.2* 10.7*  HCT 34.3*   < > 31.0* 32.0* 32.1* 34* 32*  MCV 97.4   < > 97.5 96.4 97.9  --   --   PLT 331   < > 528* 554* 616* 619* 576*   < > = values in this interval not displayed.   Lab Results  Component Value Date   TSH 0.900 06/06/2024   No results found for: HGBA1C Lab Results  Component Value Date   CHOL 131 12/08/2014   HDL 47 12/08/2014   LDLCALC 65 12/08/2014   TRIG 93 12/08/2014    Significant Diagnostic Results in last 30 days:  ECHOCARDIOGRAM COMPLETE Result Date: 06/08/2024    ECHOCARDIOGRAM REPORT   Patient Name:   [name]. Date of Exam: 06/08/2024 Medical Rec #:  [id number]             Height:       67.0 in Accession #:    [id number]           Weight:       167.0 lb Date of Birth:  11/27/1931            BSA:          1.873 m Patient Age:    89 years             BP:           168/81 mmHg Patient Gender: M                    HR:           81 bpm. Exam Location:  [facility] Procedure: 2D Echo, Color Doppler and Cardiac Doppler (Both Spectral and Color            Flow Doppler were utilized during procedure). Indications:     R01.1 Murmur  History:         Patient has no prior history of Echocardiogram examinations.                  History of Prostate CA; Risk Factors:Hypertension and                  Dyslipidemia.  Sonographer:     L. Thornton-Maynard, RDCS Referring Phys:  [id number] AIDA CHO Diagnosing Phys: Cara JONETTA Lovelace MD IMPRESSIONS  1. TDS.  2. Left ventricular ejection fraction, by estimation, is 60 to 65%. The left ventricle has normal function. The left ventricle has no regional wall motion abnormalities. There is moderate concentric left ventricular hypertrophy. Left ventricular diastolic parameters are consistent with Grade I diastolic dysfunction (impaired relaxation).  3. Right ventricular systolic function  is normal. The right ventricular size is normal.  4. The mitral valve is normal in structure. Trivial mitral valve regurgitation.  5. The aortic valve is normal in structure. Aortic valve regurgitation is not visualized. Conclusion(s)/Recommendation(s): Poor windows for evaluation of left ventricular function by transthoracic echocardiography. Would recommend an alternative means of evaluation. FINDINGS  Left Ventricle: Left ventricular ejection fraction, by estimation, is 60 to 65%. The left ventricle has normal function. The left ventricle has no regional wall motion abnormalities. Strain was performed and the global longitudinal strain is indeterminate. The left ventricular internal cavity size was normal in size. There is moderate concentric left ventricular hypertrophy. Left  ventricular diastolic parameters are consistent with Grade I diastolic dysfunction (impaired relaxation). Right Ventricle: The right ventricular size is normal. No increase in right ventricular wall thickness. Right ventricular systolic function is normal. Left Atrium: Left atrial size was normal in size. Right Atrium: Right atrial size was normal in size. Pericardium: There is no evidence of pericardial effusion. Mitral Valve: The mitral valve is normal in structure. Trivial mitral valve regurgitation. Tricuspid Valve: The tricuspid valve is normal in structure. Tricuspid valve regurgitation is mild. Aortic Valve: The aortic valve is normal in structure. Aortic valve regurgitation is not visualized. Aortic valve mean gradient measures 16.0 mmHg. Aortic valve peak gradient measures 27.2 mmHg. Aortic valve area, by VTI measures 2.02 cm. Pulmonic Valve: The pulmonic valve was normal in structure. Pulmonic valve regurgitation is not visualized. Aorta: The ascending aorta was not well visualized. IAS/Shunts: No atrial level shunt detected by color flow Doppler. Additional Comments: TDS. 3D was performed not requiring image post processing on an independent workstation and was indeterminate.  LEFT VENTRICLE PLAX 2D LVIDd:         4.30 cm     Diastology LVIDs:         2.90 cm     LV e' medial:    5.55 cm/s LV PW:         1.60 cm     LV E/e' medial:  15.3 LV IVS:        1.60 cm     LV e' lateral:   6.53 cm/s LVOT diam:     2.40 cm     LV E/e' lateral: 13.0 LV SV:         100 LV SV Index:   53 LVOT Area:     4.52 cm  LV Volumes (MOD) LV vol d, MOD A2C: 55.0 ml LV vol d, MOD A4C: 86.0 ml LV vol s, MOD A2C: 17.8 ml LV vol s, MOD A4C: 35.9 ml LV SV MOD A2C:     37.2 ml LV SV MOD A4C:     86.0 ml LV SV MOD BP:      45.5 ml RIGHT VENTRICLE RV Basal diam:  3.50 cm RV S prime:     18.20 cm/s TAPSE (M-mode): 1.8 cm LEFT ATRIUM             Index        RIGHT ATRIUM           Index LA diam:        3.20 cm 1.71 cm/m   RA Area:      17.70 cm LA Vol (A2C):   57.6 ml 30.75 ml/m  RA Volume:   47.60 ml  25.41 ml/m LA Vol (A4C):   64.7 ml 34.54 ml/m LA Biplane Vol: 63.7 ml 34.01 ml/m  AORTIC VALVE  PULMONIC VALVE AV Area (Vmax):    1.85 cm      PV Vmax:       1.01 m/s AV Area (Vmean):   1.79 cm      PV Peak grad:  4.1 mmHg AV Area (VTI):     2.02 cm AV Vmax:           261.00 cm/s AV Vmean:          185.000 cm/s AV VTI:            0.495 m AV Peak Grad:      27.2 mmHg AV Mean Grad:      16.0 mmHg LVOT Vmax:         107.00 cm/s LVOT Vmean:        73.400 cm/s LVOT VTI:          0.221 m LVOT/AV VTI ratio: 0.45  AORTA Ao Root diam: 3.40 cm Ao Asc diam:  3.20 cm MITRAL VALVE MV Area (PHT): 3.65 cm     SHUNTS MV Decel Time: 208 msec     Systemic VTI:  0.22 m MV E velocity: 84.90 cm/s   Systemic Diam: 2.40 cm MV A velocity: 102.00 cm/s MV E/A ratio:  0.83 Dwayne D Callwood MD Electronically signed by Cara JONETTA Lovelace MD Signature Date/Time: 06/08/2024/2:12:46 PM    Final     Assessment/Plan Urinary retention and constipation Urinary retention likely due to constipation and possible prostate enlargement. Constipation may obstruct the bladder outlet, contributing to retention. Recent severe constipation episode with abdominal discomfort and urinary retention relieved by catheterization. - Perform bladder scans every shift to monitor urine retention - Perform in-and-out catheterization if significant urine retention is detected - Conduct urine test if urinary retention persists to rule out infection  Anemia and leukocytosis Leukocytosis has decreased from 14 to 12.9, indicating improvement. Anemia persists with hemoglobin at 10. Recent infection and antibiotic treatment may have contributed to blood count abnormalities. - Recheck blood work this week to monitor leukocytosis and anemia - Send lab results to Dr. Maron for review  Elevated PSA Elevated PSA with previous PSA of 24 in December. Consideration of hormone  therapy to slow prostate growth was declined due to concerns about side effects such as decreased testosterone and bone weakness. - Reschedule urology appointment to evaluate elevated PSA and prostate health - Coordinate with Lindie to assist in scheduling the urology appointment  Resolved hand infection Hand infection resolved with doxycycline treatment. No longer warm, tender, or swollen. White blood cell count is improving, indicating resolution of infection. Family/ staff Communication: nursing  Labs/tests ordered:  none

## 2024-07-06 ENCOUNTER — Telehealth: Payer: Self-pay | Admitting: Internal Medicine

## 2024-07-06 NOTE — Telephone Encounter (Signed)
 Patient continues to have Bladder scan and it shows ? PVR of 350 Per Urology to place Foley if PVR more then 400. Nurse will call their office to Clarify and inform them of high PVRs. Will Hold off Foley right now unless he is in Pain or Distress

## 2024-07-07 ENCOUNTER — Encounter: Payer: Self-pay | Admitting: Student

## 2024-07-07 ENCOUNTER — Non-Acute Institutional Stay (SKILLED_NURSING_FACILITY): Payer: Self-pay | Admitting: Student

## 2024-07-07 DIAGNOSIS — M65949 Unspecified synovitis and tenosynovitis, unspecified hand: Secondary | ICD-10-CM | POA: Diagnosis not present

## 2024-07-07 DIAGNOSIS — R339 Retention of urine, unspecified: Secondary | ICD-10-CM | POA: Diagnosis not present

## 2024-07-07 NOTE — Progress Notes (Signed)
 Location:  [facility] [place] Room Number: [place] [address] Place of Service:  SNF [phone]) Provider: Abdul Fine, MD  [name], MD  Patient Care Team: [name], MD as PCP - General (Family Medicine) [name], MD as Consulting Physician (Oncology)  Extended Emergency Contact Information Primary Emergency Contact: Terpening,[name]  [place] Home Phone: [phone] Mobile Phone: [phone] Relation: Spouse Secondary Emergency Contact: [name]  [place] Home Phone: [phone] Relation: Son  Code Status:  DNR Goals of care: Advanced Directive information    07/07/2024   10:42 AM  Advanced Directives  Does Patient Have a Medical Advance Directive? Yes  Type of Estate agent of [place];Out of facility DNR (pink MOST or yellow form)  Does patient want to make changes to medical advance directive? No - Patient declined  Copy of Healthcare Power of Attorney in Chart? Yes - validated most recent copy scanned in chart (See row information)     Chief Complaint  Patient presents with   Acute Visit    Acute urinary retention follow up     HPI:  Pt is a 89 y.o. male seen today for an acute visit.   History of Present Illness He presents with urinary retention and hand pain following recent surgery.  He has been experiencing urinary retention but is urinating well enough to avoid the need for in and out catheterization. A post-void residual (PVR) of 400 milliliters was noted this week.  He also has hand pain that has persisted for the last couple of weeks since his surgery. The pain is consistent and unchanged over this period.    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   Leukocytosis     Prostate cancer (HCC)    Shingles    Past Surgical History:  Procedure Laterality Date   APPENDECTOMY     artificial left eye     EYE SURGERY     FLEXOR TENDON REPAIR Right 05/24/2024   Procedure: RIGHT FIFTH FINGER TENDON FLEXOR IRRIGATION AND DEBRIDEMENT;  Surgeon: Tobie Priest, MD;  Location: [facility] ORS;  Service: Orthopedics;  Laterality: Right;   HERNIA REPAIR     JOINT REPLACEMENT     PROSTATE SURGERY      Allergies  Allergen Reactions   Cephalexin  Diarrhea    C.diff   Hydrocodone-Acetaminophen  Nausea Only    Vomiting/dizziness    Allergies as of 07/07/2024       Reactions   Cephalexin  Diarrhea   C.diff   Hydrocodone-acetaminophen  Nausea Only   Vomiting/dizziness        Medication List        Accurate as of July 07, 2024 11:59 AM. If you have any questions, ask your nurse or doctor.          PAUSE taking these medications    eltrombopag  25 MG tablet Wait to take this until your doctor or other care provider tells you to start again. Commonly known as: Promacta  TAKE 1 TABLET (25 MG TOTAL) BY MOUTH EVERY OTHER DAY. TAKE ON AN EMPTY STOMACH, 1 HOUR BEFORE A MEAL OR 2 HOURS AFTER.   mycophenolate  500 MG tablet Wait to take this until your doctor or other care provider tells you to start again. Commonly  known as: CELLCEPT  Take 1 tablet (500 mg total) by mouth daily.       TAKE these medications    acetaminophen  500 MG tablet Commonly known as: TYLENOL  Take 500 mg by mouth every 6 (six) hours as needed for mild pain. Reported on 01/24/2016   amLODipine  5 MG tablet Commonly known as: NORVASC  Take 1 tablet (5 mg total) by mouth daily.   aspirin  EC 81 MG tablet Take 1 tablet (81 mg total) by mouth daily. Swallow whole.   cetaphil lotion Apply 1 Application topically daily.   citalopram  20 MG tablet Commonly known as: CELEXA  Take 20 mg by mouth daily.   dorzolamide  2 % ophthalmic solution Commonly known as: TRUSOPT  Place 1 drop into the right  eye 2 (two) times daily.   doxycycline 100 MG tablet Commonly known as: ADOXA Take 100 mg by mouth 2 (two) times daily. For skin infection to right hand x 7 days   latanoprost  0.005 % ophthalmic solution Commonly known as: XALATAN  Place 1 drop into the right eye at bedtime.   lisinopril  20 MG tablet Commonly known as: ZESTRIL  Take 20 mg by mouth daily.   mirabegron  ER 25 MG Tb24 tablet Commonly known as: MYRBETRIQ  Take 25 mg by mouth daily.   omeprazole  40 MG capsule Commonly known as: PRILOSEC TAKE 1 CAPSULE BY MOUTH DAILY USUALLY 30 MINUTES BEFORE BREAKFAST   polyethylene glycol 17 g packet Commonly known as: MIRALAX  / GLYCOLAX  Take 17 g by mouth every 12 (twelve) hours as needed.   pravastatin  20 MG tablet Commonly known as: PRAVACHOL  Take 20 mg by mouth at bedtime.   propranolol  40 MG tablet Commonly known as: INDERAL  Take 1 tablet by mouth 2 (two) times daily.   senna 8.6 MG Tabs tablet Commonly known as: SENOKOT Take 2 tablets by mouth daily.   Vitamin D3 250 MCG (10000 UT) capsule Take 10,000 Units by mouth daily.        Review of Systems  Immunization History  Administered Date(s) Administered   Influenza Split 08/27/2014   Influenza Whole 07/21/2016   Influenza, High Dose Seasonal PF 09/05/2021   Influenza,inj,Quad PF,6+ Mos 09/01/2015   Influenza-Unspecified 09/03/2012, 08/20/2013, 08/27/2014, 09/12/2016, 08/27/2017, 08/10/2020, 08/31/2022   PFIZER(Purple Top)SARS-COV-2 Vaccination 11/24/2019, 12/15/2019   Pfizer(Comirnaty)Fall Seasonal Vaccine 12 years and older 08/29/2023   Pneumococcal Conjugate-13 10/02/2016   Td 09/09/2021   Tdap 05/14/2013   Zoster, Live 12/12/2012   Pertinent  Health Maintenance Due  Topic Date Due   INFLUENZA VACCINE  06/20/2024      09/02/2021    2:52 PM 09/08/2021    6:37 PM 12/23/2021    2:51 PM 05/09/2022    2:25 PM 08/29/2022    2:00 PM  Fall Risk  (RETIRED) Patient Fall Risk Level Low fall risk  Low fall  risk  Low fall risk  High fall risk  Low fall risk      Data saved with a previous flowsheet row definition   Functional Status Survey:    Vitals:   07/07/24 1035  BP: (!) 147/79  Pulse: 68  Resp: 16  Temp: (!) 97.2 F (36.2 C)  SpO2: 96%  Weight: 162 lb (73.5 kg)  Height: 5' 7 (1.702 m)   Body mass index is 25.37 kg/m. Physical Exam Musculoskeletal:     Comments: Right little finger with postoperative deformity.      Labs reviewed: Recent Labs    06/05/24 0426 06/06/24 0426 06/07/24 0523 06/08/24 0453 06/09/24 0502 06/19/24 0000  07/03/24 1459  NA 133* 129* 134* 132* 132* 130* 136*  K 3.4* 4.1 4.0 3.7 3.7 4.6 4.1  CL 104 99 105 102 98 95* 98*  CO2 22 21* 22 23 25  27* 29*  GLUCOSE 87 92 95 96 106*  --   --   BUN 17 18 19 17 14 10 14   CREATININE 0.71 0.63 0.82 0.66 0.72 0.8 0.6  CALCIUM 8.6* 8.4* 8.5* 8.2* 8.7* 8.9 8.8  MG  --  1.9  --   --   --   --   --   PHOS 2.9  --   --   --   --   --   --    Recent Labs    04/28/24 1311 05/25/24 0513 06/01/24 0121  AST 22 21 27   ALT 18 21 24   ALKPHOS 86 91 95  BILITOT 0.8 0.7 0.8  PROT 6.9 7.0 6.9  ALBUMIN 3.4* 3.4* 2.7*   Recent Labs    06/07/24 0523 06/08/24 0453 06/09/24 0502 06/19/24 0000 06/22/24 0000 07/03/24 1459  WBC 9.5 13.2* 13.2* 12.9 14.4 10.1  NEUTROABS  --   --   --  10,062.00 11,016.00 7,151.00  HGB 10.9* 11.3* 11.1* 11.2* 10.7* 11.6*  HCT 31.0* 32.0* 32.1* 34* 32* 36*  MCV 97.5 96.4 97.9  --   --   --   PLT 528* 554* 616* 619* 576* 571*   Lab Results  Component Value Date   TSH 0.81 07/02/2024   No results found for: HGBA1C Lab Results  Component Value Date   CHOL 131 12/08/2014   HDL 47 12/08/2014   LDLCALC 65 12/08/2014   TRIG 93 12/08/2014    Significant Diagnostic Results in last 30 days:  ECHOCARDIOGRAM COMPLETE Result Date: 06/08/2024    ECHOCARDIOGRAM REPORT   Patient Name:   [name]. Date of Exam: 06/08/2024 Medical Rec #:  [id number]            Height:        67.0 in Accession #:    [id number]           Weight:       167.0 lb Date of Birth:  [birth year]            BSA:          1.873 m Patient Age:    89 years             BP:           168/81 mmHg Patient Gender: M                    HR:           81 bpm. Exam Location:  [facility] Procedure: 2D Echo, Color Doppler and Cardiac Doppler (Both Spectral and Color            Flow Doppler were utilized during procedure). Indications:     R01.1 Murmur  History:         Patient has no prior history of Echocardiogram examinations.                  History of Prostate CA; Risk Factors:Hypertension and                  Dyslipidemia.  Sonographer:     L. Thornton-Maynard, RDCS Referring Phys:  [id number] AIDA CHO Diagnosing Phys: Cara JONETTA Lovelace MD IMPRESSIONS  1. TDS.  2. Left ventricular ejection fraction,  by estimation, is 60 to 65%. The left ventricle has normal function. The left ventricle has no regional wall motion abnormalities. There is moderate concentric left ventricular hypertrophy. Left ventricular diastolic parameters are consistent with Grade I diastolic dysfunction (impaired relaxation).  3. Right ventricular systolic function is normal. The right ventricular size is normal.  4. The mitral valve is normal in structure. Trivial mitral valve regurgitation.  5. The aortic valve is normal in structure. Aortic valve regurgitation is not visualized. Conclusion(s)/Recommendation(s): Poor windows for evaluation of left ventricular function by transthoracic echocardiography. Would recommend an alternative means of evaluation. FINDINGS  Left Ventricle: Left ventricular ejection fraction, by estimation, is 60 to 65%. The left ventricle has normal function. The left ventricle has no regional wall motion abnormalities. Strain was performed and the global longitudinal strain is indeterminate. The left ventricular internal cavity size was normal in size. There is moderate concentric left ventricular hypertrophy. Left ventricular  diastolic parameters are consistent with Grade I diastolic dysfunction (impaired relaxation). Right Ventricle: The right ventricular size is normal. No increase in right ventricular wall thickness. Right ventricular systolic function is normal. Left Atrium: Left atrial size was normal in size. Right Atrium: Right atrial size was normal in size. Pericardium: There is no evidence of pericardial effusion. Mitral Valve: The mitral valve is normal in structure. Trivial mitral valve regurgitation. Tricuspid Valve: The tricuspid valve is normal in structure. Tricuspid valve regurgitation is mild. Aortic Valve: The aortic valve is normal in structure. Aortic valve regurgitation is not visualized. Aortic valve mean gradient measures 16.0 mmHg. Aortic valve peak gradient measures 27.2 mmHg. Aortic valve area, by VTI measures 2.02 cm. Pulmonic Valve: The pulmonic valve was normal in structure. Pulmonic valve regurgitation is not visualized. Aorta: The ascending aorta was not well visualized. IAS/Shunts: No atrial level shunt detected by color flow Doppler. Additional Comments: TDS. 3D was performed not requiring image post processing on an independent workstation and was indeterminate.  LEFT VENTRICLE PLAX 2D LVIDd:         4.30 cm     Diastology LVIDs:         2.90 cm     LV e' medial:    5.55 cm/s LV PW:         1.60 cm     LV E/e' medial:  15.3 LV IVS:        1.60 cm     LV e' lateral:   6.53 cm/s LVOT diam:     2.40 cm     LV E/e' lateral: 13.0 LV SV:         100 LV SV Index:   53 LVOT Area:     4.52 cm  LV Volumes (MOD) LV vol d, MOD A2C: 55.0 ml LV vol d, MOD A4C: 86.0 ml LV vol s, MOD A2C: 17.8 ml LV vol s, MOD A4C: 35.9 ml LV SV MOD A2C:     37.2 ml LV SV MOD A4C:     86.0 ml LV SV MOD BP:      45.5 ml RIGHT VENTRICLE RV Basal diam:  3.50 cm RV S prime:     18.20 cm/s TAPSE (M-mode): 1.8 cm LEFT ATRIUM             Index        RIGHT ATRIUM           Index LA diam:        3.20 cm 1.71 cm/m   RA Area:  17.70 cm LA  Vol (A2C):   57.6 ml 30.75 ml/m  RA Volume:   47.60 ml  25.41 ml/m LA Vol (A4C):   64.7 ml 34.54 ml/m LA Biplane Vol: 63.7 ml 34.01 ml/m  AORTIC VALVE                     PULMONIC VALVE AV Area (Vmax):    1.85 cm      PV Vmax:       1.01 m/s AV Area (Vmean):   1.79 cm      PV Peak grad:  4.1 mmHg AV Area (VTI):     2.02 cm AV Vmax:           261.00 cm/s AV Vmean:          185.000 cm/s AV VTI:            0.495 m AV Peak Grad:      27.2 mmHg AV Mean Grad:      16.0 mmHg LVOT Vmax:         107.00 cm/s LVOT Vmean:        73.400 cm/s LVOT VTI:          0.221 m LVOT/AV VTI ratio: 0.45  AORTA Ao Root diam: 3.40 cm Ao Asc diam:  3.20 cm MITRAL VALVE MV Area (PHT): 3.65 cm     SHUNTS MV Decel Time: 208 msec     Systemic VTI:  0.22 m MV E velocity: 84.90 cm/s   Systemic Diam: 2.40 cm MV A velocity: 102.00 cm/s MV E/A ratio:  0.83 Dwayne D Callwood MD Electronically signed by Cara JONETTA Lovelace MD Signature Date/Time: 06/08/2024/2:12:46 PM    Final     Assessment/Plan Urinary retention Urinary retention with a post-void residual of 400 mL. No discomfort reported. Able to urinate without catheterization. Condition is stable. - Avoid catheterization unless symptoms worsen.  Postoperative hand pain Persistent postoperative hand pain for the last couple of weeks. No change in severity or symptoms. Pain is not concerning.  Family/ staff Communication: spouse, nursing  Labs/tests ordered:   none

## 2024-07-14 ENCOUNTER — Non-Acute Institutional Stay (SKILLED_NURSING_FACILITY): Payer: Self-pay | Admitting: Student

## 2024-07-14 DIAGNOSIS — R32 Unspecified urinary incontinence: Secondary | ICD-10-CM

## 2024-07-14 DIAGNOSIS — I10 Essential (primary) hypertension: Secondary | ICD-10-CM

## 2024-07-14 DIAGNOSIS — R410 Disorientation, unspecified: Secondary | ICD-10-CM | POA: Diagnosis not present

## 2024-07-14 LAB — CBC AND DIFFERENTIAL
HCT: 32 — AB (ref 41–53)
Hemoglobin: 10.4 — AB (ref 13.5–17.5)
Neutrophils Absolute: 5359
Platelets: 415 K/uL — AB (ref 150–400)
WBC: 7.7

## 2024-07-14 LAB — HEPATIC FUNCTION PANEL
ALT: 19 U/L (ref 10–40)
AST: 20 (ref 14–40)
Alkaline Phosphatase: 101 (ref 25–125)
Bilirubin, Total: 0.3

## 2024-07-14 LAB — BASIC METABOLIC PANEL WITH GFR
BUN: 15 (ref 4–21)
CO2: 28 — AB (ref 13–22)
Chloride: 100 (ref 99–108)
Creatinine: 0.7 (ref 0.6–1.3)
Glucose: 83
Potassium: 4.2 meq/L (ref 3.5–5.1)
Sodium: 136 — AB (ref 137–147)

## 2024-07-14 LAB — COMPREHENSIVE METABOLIC PANEL WITH GFR
Albumin: 3.1 — AB (ref 3.5–5.0)
Calcium: 8.8 (ref 8.7–10.7)
Globulin: 2.4

## 2024-07-14 LAB — CBC: RBC: 3.23 — AB (ref 3.87–5.11)

## 2024-07-15 ENCOUNTER — Encounter: Payer: Self-pay | Admitting: Nurse Practitioner

## 2024-07-15 ENCOUNTER — Non-Acute Institutional Stay (SKILLED_NURSING_FACILITY): Payer: Self-pay | Admitting: Nurse Practitioner

## 2024-07-15 DIAGNOSIS — I10 Essential (primary) hypertension: Secondary | ICD-10-CM

## 2024-07-15 DIAGNOSIS — R4189 Other symptoms and signs involving cognitive functions and awareness: Secondary | ICD-10-CM | POA: Diagnosis not present

## 2024-07-15 DIAGNOSIS — K219 Gastro-esophageal reflux disease without esophagitis: Secondary | ICD-10-CM | POA: Diagnosis not present

## 2024-07-15 DIAGNOSIS — R339 Retention of urine, unspecified: Secondary | ICD-10-CM | POA: Diagnosis not present

## 2024-07-15 DIAGNOSIS — K5900 Constipation, unspecified: Secondary | ICD-10-CM | POA: Diagnosis not present

## 2024-07-15 DIAGNOSIS — Z8546 Personal history of malignant neoplasm of prostate: Secondary | ICD-10-CM | POA: Diagnosis not present

## 2024-07-15 DIAGNOSIS — D693 Immune thrombocytopenic purpura: Secondary | ICD-10-CM | POA: Diagnosis not present

## 2024-07-15 NOTE — Progress Notes (Signed)
 Location:  Other [place].  Nursing Home Room Number: [id number] Place of Service:  SNF [phone]) [name], NP  PCP: [name], MD  Patient Care Team: [name], MD as PCP - General (Family Medicine) [name], MD as Consulting Physician (Oncology)  Extended Emergency Contact Information Primary Emergency Contact: Knickerbocker,[name]  [place] Home Phone: [phone] Mobile Phone: [phone] Relation: Spouse Secondary Emergency Contact: Prayan, Ulin  [place] Home Phone: [phone] Relation: Son  Goals of care: Advanced Directive information    07/07/2024   10:42 AM  Advanced Directives  Does Patient Have a Medical Advance Directive? Yes  Type of Estate agent of [place];Out of facility DNR (pink MOST or yellow form)  Does patient want to make changes to medical advance directive? No - Patient declined  Copy of Healthcare Power of Attorney in Chart? Yes - validated most recent copy scanned in chart (See row information)     Chief Complaint  Patient presents with   Medical Management of Chronic Issues    Medical Management of Chronic Issues.     HPI:  Pt is a 89 y.o. male seen today for medical management of chronic disease. Pt with hx of htn, drug induced autoimmune hemolytic anemia, prostate cancer, hx of ITP, hyperlipidemia, GERD and dementia. He went to the hospital due to have progressive weakness and noted to have left lower lobe pneumonia. He has been at [facility] for rehab. He is followed by urology due to urinary retention and prostate cancer, he has some retention noted but able to void without having to use catheter.  Chronic ITP followed by oncology  He has ongoing weakness and needing assistance with walking.     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   Leukocytosis    Prostate cancer (HCC)    Shingles    Past Surgical History:  Procedure Laterality Date   APPENDECTOMY     artificial left eye     EYE SURGERY     FLEXOR TENDON REPAIR Right 05/24/2024   Procedure: RIGHT FIFTH FINGER TENDON FLEXOR IRRIGATION AND DEBRIDEMENT;  Surgeon: Tobie Priest, MD;  Location: [facility] ORS;  Service: Orthopedics;  Laterality: Right;   HERNIA REPAIR     JOINT REPLACEMENT     PROSTATE SURGERY      Allergies  Allergen Reactions   Cephalexin  Diarrhea    C.diff   Hydrocodone-Acetaminophen  Nausea Only    Vomiting/dizziness    Outpatient Encounter Medications as of 07/15/2024  Medication Sig   acetaminophen  (TYLENOL ) 500 MG tablet Take 500 mg by mouth every 6 (six) hours as needed for mild pain. Reported on 01/24/2016   aspirin  EC 81 MG tablet Take 1 tablet (81 mg total) by mouth daily. Swallow whole.   cetaphil (CETAPHIL) lotion Apply 1 Application topically daily.   Cholecalciferol  (VITAMIN D3) 250 MCG (10000 UT) capsule Take 10,000 Units by mouth daily.   citalopram  (CELEXA ) 20 MG tablet Take 20 mg by mouth daily.   dorzolamide  (TRUSOPT ) 2 % ophthalmic solution Place 1 drop into the right eye 2 (two) times daily.   latanoprost  (XALATAN ) 0.005 % ophthalmic solution Place 1 drop into the right eye at bedtime.   lisinopril  (ZESTRIL ) 20 MG tablet Take 20 mg by  mouth daily.   mirabegron  ER (MYRBETRIQ ) 25 MG TB24 tablet Take 25 mg by mouth daily.   omeprazole  (PRILOSEC) 40 MG capsule TAKE 1 CAPSULE BY MOUTH DAILY USUALLY 30 MINUTES BEFORE BREAKFAST   polyethylene glycol (MIRALAX  / GLYCOLAX ) 17 g packet Take 17 g by mouth every 12 (twelve) hours as needed.   pravastatin  (PRAVACHOL ) 20 MG tablet Take 20 mg by mouth at bedtime.    propranolol  (INDERAL ) 40 MG tablet Take 1 tablet by mouth 2 (two) times daily.   senna (SENOKOT) 8.6 MG TABS tablet Take 2 tablets by mouth daily.    amLODipine  (NORVASC ) 5 MG tablet Take 1 tablet (5 mg total) by mouth daily. (Patient not taking: Reported on 07/15/2024)   doxycycline (ADOXA) 100 MG tablet Take 100 mg by mouth 2 (two) times daily. For skin infection to right hand x 7 days (Patient not taking: Reported on 07/15/2024)   [Paused] eltrombopag  (PROMACTA ) 25 MG tablet TAKE 1 TABLET (25 MG TOTAL) BY MOUTH EVERY OTHER DAY. TAKE ON AN EMPTY STOMACH, 1 HOUR BEFORE A MEAL OR 2 HOURS AFTER. (Patient not taking: Reported on 07/15/2024)   [Paused] mycophenolate  (CELLCEPT ) 500 MG tablet Take 1 tablet (500 mg total) by mouth daily. (Patient not taking: Reported on 07/15/2024)   No facility-administered encounter medications on file as of 07/15/2024.    Review of Systems  Constitutional:  Negative for activity change, appetite change, fatigue and unexpected weight change.  HENT:  Negative for congestion and hearing loss.   Eyes: Negative.   Respiratory:  Negative for cough and shortness of breath.   Cardiovascular:  Negative for chest pain, palpitations and leg swelling.  Gastrointestinal:  Negative for abdominal pain, constipation and diarrhea.  Genitourinary:  Negative for difficulty urinating and dysuria.  Musculoskeletal:  Negative for arthralgias and myalgias.  Skin:  Negative for color change and wound.  Neurological:  Positive for weakness. Negative for dizziness.  Psychiatric/Behavioral:  Positive for confusion. Negative for agitation and behavioral problems.      Immunization History  Administered Date(s) Administered   INFLUENZA, HIGH DOSE SEASONAL PF 09/05/2021   Influenza Split 08/27/2014   Influenza Whole 07/21/2016   Influenza,inj,Quad PF,6+ Mos 09/01/2015   Influenza-Unspecified 09/03/2012, 08/20/2013, 08/27/2014, 09/12/2016, 08/27/2017, 08/10/2020, 08/31/2022   PFIZER(Purple Top)SARS-COV-2 Vaccination 11/24/2019, 12/15/2019   Pfizer(Comirnaty)Fall Seasonal Vaccine 12 years and older 08/29/2023   Pneumococcal Conjugate-13  10/02/2016   Td 09/09/2021   Tdap 05/14/2013   Zoster, Live 12/12/2012   Pertinent  Health Maintenance Due  Topic Date Due   INFLUENZA VACCINE  06/20/2024      09/02/2021    2:52 PM 09/08/2021    6:37 PM 12/23/2021    2:51 PM 05/09/2022    2:25 PM 08/29/2022    2:00 PM  Fall Risk  (RETIRED) Patient Fall Risk Level Low fall risk  Low fall risk  Low fall risk  High fall risk  Low fall risk      Data saved with a previous flowsheet row definition   Functional Status Survey:    Vitals:   07/15/24 1256 07/15/24 1311  BP: (!) 144/72 117/82  Pulse: 75   Resp: 18   Temp: (!) 97.2 F (36.2 C)   SpO2: 96%   Weight: 162 lb (73.5 kg)   Height: 5' 7 (1.702 m)    Body mass index is 25.37 kg/m. Physical Exam Constitutional:      General: He is not in acute distress.    Appearance: He is well-developed. He  is not diaphoretic.  HENT:     Head: Normocephalic and atraumatic.     Right Ear: External ear normal.     Left Ear: External ear normal.     Mouth/Throat:     Pharynx: No oropharyngeal exudate.  Eyes:     Conjunctiva/sclera: Conjunctivae normal.     Pupils: Pupils are equal, round, and reactive to light.  Cardiovascular:     Rate and Rhythm: Normal rate and regular rhythm.     Heart sounds: Normal heart sounds.  Pulmonary:     Effort: Pulmonary effort is normal.     Breath sounds: Normal breath sounds.  Abdominal:     General: Bowel sounds are normal.     Palpations: Abdomen is soft.  Musculoskeletal:        General: No tenderness.     Cervical back: Normal range of motion and neck supple.     Right lower leg: No edema.     Left lower leg: No edema.  Skin:    General: Skin is warm and dry.  Neurological:     Mental Status: He is alert. Mental status is at baseline. He is disoriented.     Motor: Weakness present.     Gait: Gait abnormal.     Labs reviewed: Recent Labs    06/05/24 0426 06/06/24 0426 06/07/24 0523 06/08/24 0453 06/09/24 0502  06/19/24 0000 07/03/24 1459 07/14/24 0000  NA 133* 129* 134* 132* 132* 130* 136* 136*  K 3.4* 4.1 4.0 3.7 3.7 4.6 4.1 4.2  CL 104 99 105 102 98 95* 98* 100  CO2 22 21* 22 23 25  27* 29* 28*  GLUCOSE 87 92 95 96 106*  --   --   --   BUN 17 18 19 17 14 10 14 15   CREATININE 0.71 0.63 0.82 0.66 0.72 0.8 0.6 0.7  CALCIUM 8.6* 8.4* 8.5* 8.2* 8.7* 8.9 8.8 8.8  MG  --  1.9  --   --   --   --   --   --   PHOS 2.9  --   --   --   --   --   --   --    Recent Labs    04/28/24 1311 05/25/24 0513 06/01/24 0121 07/14/24 0000  AST 22 21 27 20   ALT 18 21 24 19   ALKPHOS 86 91 95 101  BILITOT 0.8 0.7 0.8  --   PROT 6.9 7.0 6.9  --   ALBUMIN 3.4* 3.4* 2.7* 3.1*   Recent Labs    06/07/24 0523 06/08/24 0453 06/09/24 0502 06/19/24 0000 06/22/24 0000 07/03/24 1459 07/14/24 0000  WBC 9.5 13.2* 13.2*   < > 14.4 10.1 7.7  NEUTROABS  --   --   --    < > 11,016.00 7,151.00 5,359.00  HGB 10.9* 11.3* 11.1*   < > 10.7* 11.6* 10.4*  HCT 31.0* 32.0* 32.1*   < > 32* 36* 32*  MCV 97.5 96.4 97.9  --   --   --   --   PLT 528* 554* 616*   < > 576* 571* 415*   < > = values in this interval not displayed.   Lab Results  Component Value Date   TSH 0.81 07/02/2024   No results found for: HGBA1C Lab Results  Component Value Date   CHOL 131 12/08/2014   HDL 47 12/08/2014   LDLCALC 65 12/08/2014   TRIG 93 12/08/2014    Significant Diagnostic Results in  last 30 days:  No results found.  Assessment/Plan No problem-specific Assessment & Plan notes found for this encounter. 1. Essential hypertension (Primary) Overall stable, will continue current regimen  2. Chronic ITP (idiopathic thrombocytopenia) (HCC) Continues asa 81 mg per hematology and followed by Dr Rennie outpatient.   3. History of prostate cancer Followed by urology, ongoing PSA monitoring  4. Cognitive impairment Ongoing, delirium noted on admission but continues to have impairment of memory. Needing increase in supervision.    5. Constipation, unspecified constipation type Controlled on current regimen  6. Gastroesophageal reflux disease without esophagitis Controlled on omeprazole   7. Urinary retention Followed by urology, to hold off on foley until residual greater than 400.  No current symptoms.      Yazhini Mcaulay K. Caro BODILY [facility] [phone]

## 2024-07-19 ENCOUNTER — Encounter: Payer: Self-pay | Admitting: Student

## 2024-07-19 NOTE — Progress Notes (Signed)
 Location:  Other Nursing Home Room Number: [facility] 112A Place of Service:  SNF [phone]) Provider:  Abdul Sayre, Jerona, MD  Patient Care Team: Sayre Jerona, MD as PCP - General (Family Medicine) [name], MD as Consulting Physician (Oncology)  Extended Emergency Contact Information Primary Emergency Contact: Alcantar,[name]  [place] Home Phone: [phone] Mobile Phone: [phone] Relation: Spouse Secondary Emergency Contact: [name]  [place] Home Phone: [phone] Relation: Son  Code Status:  DNR Goals of care: Advanced Directive information    07/07/2024   10:42 AM  Advanced Directives  Does Patient Have a Medical Advance Directive? Yes  Type of Estate agent of [place];Out of facility DNR (pink MOST or yellow form)  Does patient want to make changes to medical advance directive? No - Patient declined  Copy of Healthcare Power of Attorney in Chart? Yes - validated most recent copy scanned in chart (See row information)     Chief Complaint  Patient presents with   Acute Visit    HPI:  Pt is a 89 y.o. male seen today for an acute visit.  Discussed the use of AI scribe software for clinical note transcription with the patient, who gave verbal consent to proceed.  History of Present Illness  History of Present Illness The patient is an 89 year old who presents with episodes of confusion and elevated blood pressure. He is accompanied by [place] and his niece, Verneita.  He experienced a sudden episode of confusion and disorientation after attending church and lunch, described as 'everything was out of whack.' During this episode, he was unable to communicate effectively, and his speech was incoherent. The confusion lasted for about half an hour before he was redirected with light activities. Witnesses, including Inocente Verneita, and a CNA named Autumn, noted that he was not his  usual self.  During the episode, his blood pressure was recorded at 179/79 mmHg and later increased to 180 mmHg systolic. He has been taking lisinopril  and propranolol  for blood pressure, and there was a mention that his medication regimen might have been altered during a recent hospital stay. There is no current use of Norvasc , which was previously part of his regimen.  He has a history of urinary incontinence, which has worsened and is a significant concern for his long-term care needs. He is currently in a care facility where he receives therapy, which has been beneficial, but he still requires assistance with daily activities.  He has been sleeping well and having regular bowel movements. No pain or other acute symptoms were reported during the episode of confusion. Blood work and urine tests were conducted to rule out other causes, such as a urinary tract infection, but results are pending.  Past Medical History - Hypertension - Urinary incontinence - Confusion, transient episode with suspected stroke-like symptoms    Medications - Lisinopril  - Propranolol   Physical Exam NEUROLOGICAL: Normal  Assessment and Plan    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   Leukocytosis    Prostate cancer (HCC)    Shingles    Past Surgical History:  Procedure Laterality Date   APPENDECTOMY     artificial left eye     EYE SURGERY     FLEXOR TENDON REPAIR Right 05/24/2024  Procedure: RIGHT FIFTH FINGER TENDON FLEXOR IRRIGATION AND DEBRIDEMENT;  Surgeon: Tobie Priest, MD;  Location: [facility] ORS;  Service: Orthopedics;  Laterality: Right;   HERNIA REPAIR     JOINT REPLACEMENT     PROSTATE SURGERY      Allergies  Allergen Reactions   Cephalexin  Diarrhea    C.diff   Hydrocodone-Acetaminophen  Nausea Only     Vomiting/dizziness    Outpatient Encounter Medications as of 07/14/2024  Medication Sig   acetaminophen  (TYLENOL ) 500 MG tablet Take 500 mg by mouth every 6 (six) hours as needed for mild pain. Reported on 01/24/2016   aspirin  EC 81 MG tablet Take 1 tablet (81 mg total) by mouth daily. Swallow whole.   cetaphil (CETAPHIL) lotion Apply 1 Application topically daily.   Cholecalciferol  (VITAMIN D3) 250 MCG (10000 UT) capsule Take 10,000 Units by mouth daily.   citalopram  (CELEXA ) 20 MG tablet Take 20 mg by mouth daily.   dorzolamide  (TRUSOPT ) 2 % ophthalmic solution Place 1 drop into the right eye 2 (two) times daily.   [Paused] eltrombopag  (PROMACTA ) 25 MG tablet TAKE 1 TABLET (25 MG TOTAL) BY MOUTH EVERY OTHER DAY. TAKE ON AN EMPTY STOMACH, 1 HOUR BEFORE A MEAL OR 2 HOURS AFTER. (Patient not taking: Reported on 07/15/2024)   latanoprost  (XALATAN ) 0.005 % ophthalmic solution Place 1 drop into the right eye at bedtime.   lisinopril  (ZESTRIL ) 20 MG tablet Take 20 mg by mouth daily.   mirabegron  ER (MYRBETRIQ ) 25 MG TB24 tablet Take 25 mg by mouth daily.   [Paused] mycophenolate  (CELLCEPT ) 500 MG tablet Take 1 tablet (500 mg total) by mouth daily. (Patient not taking: Reported on 07/15/2024)   omeprazole  (PRILOSEC) 40 MG capsule TAKE 1 CAPSULE BY MOUTH DAILY USUALLY 30 MINUTES BEFORE BREAKFAST   polyethylene glycol (MIRALAX  / GLYCOLAX ) 17 g packet Take 17 g by mouth every 12 (twelve) hours as needed.   pravastatin  (PRAVACHOL ) 20 MG tablet Take 20 mg by mouth at bedtime.    propranolol  (INDERAL ) 40 MG tablet Take 1 tablet by mouth 2 (two) times daily.   senna (SENOKOT) 8.6 MG TABS tablet Take 2 tablets by mouth daily.   [DISCONTINUED] amLODipine  (NORVASC ) 5 MG tablet Take 1 tablet (5 mg total) by mouth daily. (Patient not taking: Reported on 07/15/2024)   [DISCONTINUED] doxycycline (ADOXA) 100 MG tablet Take 100 mg by mouth 2 (two) times daily. For skin infection to right hand x 7 days (Patient not taking:  Reported on 07/15/2024)   No facility-administered encounter medications on file as of 07/14/2024.    Review of Systems  Immunization History  Administered Date(s) Administered   INFLUENZA, HIGH DOSE SEASONAL PF 09/05/2021   Influenza Split 08/27/2014   Influenza Whole 07/21/2016   Influenza,inj,Quad PF,6+ Mos 09/01/2015   Influenza-Unspecified 09/03/2012, 08/20/2013, 08/27/2014, 09/12/2016, 08/27/2017, 08/10/2020, 08/31/2022   PFIZER(Purple Top)SARS-COV-2 Vaccination 11/24/2019, 12/15/2019   Pfizer(Comirnaty)Fall Seasonal Vaccine 12 years and older 08/29/2023   Pneumococcal Conjugate-13 10/02/2016   Td 09/09/2021   Tdap 05/14/2013   Zoster, Live 12/12/2012   Pertinent  Health Maintenance Due  Topic Date Due   INFLUENZA VACCINE  06/20/2024      09/02/2021    2:52 PM 09/08/2021    6:37 PM 12/23/2021    2:51 PM 05/09/2022    2:25 PM 08/29/2022    2:00 PM  Fall Risk  (RETIRED) Patient Fall Risk Level Low fall risk  Low fall risk  Low fall risk  High fall risk  Low fall risk  Data saved with a previous flowsheet row definition   Functional Status Survey:    Vitals:   07/19/24 0019  BP: (!) 142/70  Pulse: 78  Resp: 18  Temp: (!) 97.3 F (36.3 C)  SpO2: 92%   There is no height or weight on file to calculate BMI. Physical Exam  Labs reviewed: Recent Labs    06/05/24 0426 06/06/24 0426 06/07/24 0523 06/08/24 0453 06/09/24 0502 06/19/24 0000 07/03/24 1459 07/14/24 0000  NA 133* 129* 134* 132* 132* 130* 136* 136*  K 3.4* 4.1 4.0 3.7 3.7 4.6 4.1 4.2  CL 104 99 105 102 98 95* 98* 100  CO2 22 21* 22 23 25  27* 29* 28*  GLUCOSE 87 92 95 96 106*  --   --   --   BUN 17 18 19 17 14 10 14 15   CREATININE 0.71 0.63 0.82 0.66 0.72 0.8 0.6 0.7  CALCIUM 8.6* 8.4* 8.5* 8.2* 8.7* 8.9 8.8 8.8  MG  --  1.9  --   --   --   --   --   --   PHOS 2.9  --   --   --   --   --   --   --    Recent Labs    04/28/24 1311 05/25/24 0513 06/01/24 0121 07/14/24 0000  AST 22 21 27  20   ALT 18 21 24 19   ALKPHOS 86 91 95 101  BILITOT 0.8 0.7 0.8  --   PROT 6.9 7.0 6.9  --   ALBUMIN 3.4* 3.4* 2.7* 3.1*   Recent Labs    06/07/24 0523 06/08/24 0453 06/09/24 0502 06/19/24 0000 06/22/24 0000 07/03/24 1459 07/14/24 0000  WBC 9.5 13.2* 13.2*   < > 14.4 10.1 7.7  NEUTROABS  --   --   --    < > 11,016.00 7,151.00 5,359.00  HGB 10.9* 11.3* 11.1*   < > 10.7* 11.6* 10.4*  HCT 31.0* 32.0* 32.1*   < > 32* 36* 32*  MCV 97.5 96.4 97.9  --   --   --   --   PLT 528* 554* 616*   < > 576* 571* 415*   < > = values in this interval not displayed.   Lab Results  Component Value Date   TSH 0.81 07/02/2024   No results found for: HGBA1C Lab Results  Component Value Date   CHOL 131 12/08/2014   HDL 47 12/08/2014   LDLCALC 65 12/08/2014   TRIG 93 12/08/2014    Significant Diagnostic Results in last 30 days:  No results found.  Assessment/Plan Transient episode of confusion He experienced a transient episode of confusion, raising concerns about a possible stroke. Neuro checks have been normal, and he has returned to his baseline cognitive function. The episode was likely transient and not indicative of a stroke, as there were no asymmetries or other stroke symptoms. Differential diagnosis includes a urinary tract infection, for which labs were drawn.  Hypertension His blood pressure was elevated over the weekend, reaching 179/79 and 180/?[name] Typically, his blood pressure is well-controlled with lisinopril  and propranolol . There is a concern about the recent elevation, and the possibility of adding Norvasc  was discussed if blood pressure remains elevated. - Monitor blood pressure every shift. - Consider adding Norvasc  if blood pressure remains elevated.  Urinary incontinence He has urinary incontinence, which is a common issue in elderly patients and can complicate home care. There are no surgical options or catheterization planned at this  time. Management of incontinence is  challenging and may necessitate long-term care. - Discuss with social worker about the possibility of another appeal for continued care coverage. - Consider long-term care options if incontinence management becomes unmanageable at home.  Cognitive impairment He has baseline cognitive impairment, which includes some level of confusion. The recent episode of confusion was more severe than his baseline but has resolved. Continued monitoring and support are necessary. - Provide supportive care and consider additional therapy if needed.  Family/ staff Communication: Spouse, nursing  Labs/tests ordered:  UA/Cx, CBC, CMp

## 2024-07-22 ENCOUNTER — Inpatient Hospital Stay: Attending: Internal Medicine

## 2024-07-22 DIAGNOSIS — Z79899 Other long term (current) drug therapy: Secondary | ICD-10-CM | POA: Insufficient documentation

## 2024-07-22 DIAGNOSIS — I10 Essential (primary) hypertension: Secondary | ICD-10-CM | POA: Insufficient documentation

## 2024-07-22 DIAGNOSIS — Z885 Allergy status to narcotic agent status: Secondary | ICD-10-CM | POA: Insufficient documentation

## 2024-07-22 DIAGNOSIS — Z820 Family history of epilepsy and other diseases of the nervous system: Secondary | ICD-10-CM | POA: Insufficient documentation

## 2024-07-22 DIAGNOSIS — Z9049 Acquired absence of other specified parts of digestive tract: Secondary | ICD-10-CM | POA: Insufficient documentation

## 2024-07-22 DIAGNOSIS — D693 Immune thrombocytopenic purpura: Secondary | ICD-10-CM | POA: Insufficient documentation

## 2024-07-22 DIAGNOSIS — Z7982 Long term (current) use of aspirin: Secondary | ICD-10-CM | POA: Insufficient documentation

## 2024-07-22 DIAGNOSIS — Z79624 Long term (current) use of inhibitors of nucleotide synthesis: Secondary | ICD-10-CM | POA: Insufficient documentation

## 2024-07-22 DIAGNOSIS — Z809 Family history of malignant neoplasm, unspecified: Secondary | ICD-10-CM | POA: Insufficient documentation

## 2024-07-22 DIAGNOSIS — D591 Autoimmune hemolytic anemia, unspecified: Secondary | ICD-10-CM | POA: Insufficient documentation

## 2024-07-22 DIAGNOSIS — Z881 Allergy status to other antibiotic agents status: Secondary | ICD-10-CM | POA: Insufficient documentation

## 2024-08-04 LAB — VITAMIN D 25 HYDROXY (VIT D DEFICIENCY, FRACTURES): Vit D, 25-Hydroxy: 31

## 2024-08-12 ENCOUNTER — Encounter: Payer: Self-pay | Admitting: Nurse Practitioner

## 2024-08-12 ENCOUNTER — Non-Acute Institutional Stay (SKILLED_NURSING_FACILITY): Payer: Self-pay | Admitting: Nurse Practitioner

## 2024-08-12 DIAGNOSIS — E7800 Pure hypercholesterolemia, unspecified: Secondary | ICD-10-CM

## 2024-08-12 DIAGNOSIS — R32 Unspecified urinary incontinence: Secondary | ICD-10-CM

## 2024-08-12 DIAGNOSIS — M65949 Unspecified synovitis and tenosynovitis, unspecified hand: Secondary | ICD-10-CM

## 2024-08-12 DIAGNOSIS — K219 Gastro-esophageal reflux disease without esophagitis: Secondary | ICD-10-CM | POA: Diagnosis not present

## 2024-08-12 DIAGNOSIS — I10 Essential (primary) hypertension: Secondary | ICD-10-CM

## 2024-08-12 DIAGNOSIS — R4189 Other symptoms and signs involving cognitive functions and awareness: Secondary | ICD-10-CM

## 2024-08-12 DIAGNOSIS — K5900 Constipation, unspecified: Secondary | ICD-10-CM | POA: Diagnosis not present

## 2024-08-12 NOTE — Progress Notes (Signed)
 Location:  Other [place].  Nursing Home Room Number: [facility] [id number] Place of Service:  SNF (31) [name], NP  PCP: [name], MD  Patient Care Team: [name], MD as PCP - General (Family Medicine) [name], MD as Consulting Physician (Oncology)  Extended Emergency Contact Information Primary Emergency Contact: Cornforth,[name]  [place] Home Phone: [phone] Mobile Phone: [phone] Relation: Spouse Secondary Emergency Contact: Burnard, Enis  [place] Home Phone: [phone] Relation: Son  Goals of care: Advanced Directive information    07/07/2024   10:42 AM  Advanced Directives  Does Patient Have a Medical Advance Directive? Yes  Type of Estate agent of [place];Out of facility DNR (pink MOST or yellow form)  Does patient want to make changes to medical advance directive? No - Patient declined  Copy of Healthcare Power of Attorney in Chart? Yes - validated most recent copy scanned in chart (See row information)    Chief Complaint  Patient presents with   Medical Management of Chronic Issues    Medical Management of Chronic Issues.    HPI: An 89 year old male seen for chronic disease management, accompanied by his wife who assists in answering questions. Due to his deconditioning and balance issues the plan is for him to stay in long term care at [facility].   He reports pain and recent swelling in the right pinky finger, following recent surgery (July 2025) and an episode of cellulitis without a clear cause. He was treated with doxycycline which has resolved cellulitis and is scheduled to see a hand specialist this week.  He has regular bowel movements but complains of frequent urination and uses briefs and nighttime pads. He has a history of prostate cancer, is incontinent and has bowel movements about twice daily. His wife reports he has no urinary control.  He has  an inward turning right foot when walking, due to prior right knee surgery. Although he reports no knee pain, he is a high fall risk. His wife requests increased physical activity, especially walking on weekends.  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   Leukocytosis    Prostate cancer (HCC)    Shingles    Past Surgical History:  Procedure Laterality Date   APPENDECTOMY     artificial left eye     EYE SURGERY     FLEXOR TENDON REPAIR Right 05/24/2024   Procedure: RIGHT FIFTH FINGER TENDON FLEXOR IRRIGATION AND DEBRIDEMENT;  Surgeon: Tobie Priest, MD;  Location: [facility] ORS;  Service: Orthopedics;  Laterality: Right;   HERNIA REPAIR     JOINT REPLACEMENT     PROSTATE SURGERY      Allergies  Allergen Reactions   Cephalexin  Diarrhea    C.diff   Hydrocodone-Acetaminophen  Nausea Only    Vomiting/dizziness    Outpatient Encounter Medications as of 08/12/2024  Medication Sig   acetaminophen  (TYLENOL ) 500 MG tablet Take 500 mg by mouth every 6 (six) hours as needed for mild pain. Reported on 01/24/2016   acetaminophen  (TYLENOL ) 500 MG tablet Take 1,000 mg by mouth at bedtime.   aspirin  EC 81 MG tablet Take 1 tablet (81 mg total) by mouth daily. Swallow whole.   bisacodyl  (DULCOLAX) 10 MG suppository Place 10 mg rectally daily as needed for moderate constipation.  cetaphil (CETAPHIL) lotion Apply 1 Application topically daily.   Cholecalciferol  (VITAMIN D3) 250 MCG (10000 UT) capsule Take 10,000 Units by mouth daily.   citalopram  (CELEXA ) 20 MG tablet Take 20 mg by mouth daily.   dorzolamide  (TRUSOPT ) 2 % ophthalmic solution Place 1 drop into the right eye 2 (two) times daily.   latanoprost  (XALATAN ) 0.005 % ophthalmic solution Place 1 drop into the right eye at bedtime.   lisinopril  (ZESTRIL ) 20 MG tablet Take 20  mg by mouth daily.   omeprazole  (PRILOSEC) 40 MG capsule TAKE 1 CAPSULE BY MOUTH DAILY USUALLY 30 MINUTES BEFORE BREAKFAST   polyethylene glycol (MIRALAX  / GLYCOLAX ) 17 g packet Take 17 g by mouth every 12 (twelve) hours as needed.   pravastatin  (PRAVACHOL ) 20 MG tablet Take 20 mg by mouth at bedtime.    propranolol  (INDERAL ) 40 MG tablet Take 1 tablet by mouth 2 (two) times daily.   senna (SENOKOT) 8.6 MG TABS tablet Take 2 tablets by mouth daily.   [Paused] eltrombopag  (PROMACTA ) 25 MG tablet TAKE 1 TABLET (25 MG TOTAL) BY MOUTH EVERY OTHER DAY. TAKE ON AN EMPTY STOMACH, 1 HOUR BEFORE A MEAL OR 2 HOURS AFTER. (Patient not taking: Reported on 08/12/2024)   mirabegron  ER (MYRBETRIQ ) 25 MG TB24 tablet Take 25 mg by mouth daily. (Patient not taking: Reported on 08/12/2024)   [Paused] mycophenolate  (CELLCEPT ) 500 MG tablet Take 1 tablet (500 mg total) by mouth daily. (Patient not taking: Reported on 08/12/2024)   No facility-administered encounter medications on file as of 08/12/2024.    Review of Systems  Constitutional: Negative.   HENT:  Positive for hearing loss.   Respiratory: Negative.    Cardiovascular: Negative.   Gastrointestinal: Negative.   Genitourinary:  Positive for frequency and urgency.  Musculoskeletal:  Positive for joint swelling.  Skin:        Redness to right hand pinky finger  Neurological:  Positive for weakness.  Psychiatric/Behavioral: Negative.       Immunization History  Administered Date(s) Administered   INFLUENZA, HIGH DOSE SEASONAL PF 09/05/2021   Influenza Split 08/27/2014   Influenza Whole 07/21/2016   Influenza,inj,Quad PF,6+ Mos 09/01/2015   Influenza-Unspecified 09/03/2012, 08/20/2013, 08/27/2014, 09/12/2016, 08/27/2017, 08/10/2020, 08/31/2022   PFIZER(Purple Top)SARS-COV-2 Vaccination 11/24/2019, 12/15/2019   Pfizer(Comirnaty)Fall Seasonal Vaccine 12 years and older 08/29/2023   Pneumococcal Conjugate-13 10/02/2016   Td 09/09/2021   Tdap 05/14/2013    Zoster, Live 12/12/2012   Pertinent  Health Maintenance Due  Topic Date Due   Influenza Vaccine  06/20/2024      09/02/2021    2:52 PM 09/08/2021    6:37 PM 12/23/2021    2:51 PM 05/09/2022    2:25 PM 08/29/2022    2:00 PM  Fall Risk  (RETIRED) Patient Fall Risk Level Low fall risk  Low fall risk  Low fall risk  High fall risk  Low fall risk      Data saved with a previous flowsheet row definition   Functional Status Survey:    Vitals:   08/12/24 0844 08/12/24 0853  BP: (!) 142/80 131/78  Pulse: 66   Resp: 18   Temp: (!) 97.3 F (36.3 C)   SpO2: 94%   Weight: 161 lb 3.2 oz (73.1 kg)   Height: 5' 7 (1.702 m)    Body mass index is 25.25 kg/m. Physical Exam Vitals reviewed.  Constitutional:      Appearance: Normal appearance.  HENT:     Head: Normocephalic and atraumatic.  Right Ear: External ear normal.     Left Ear: External ear normal.     Nose: Nose normal.     Mouth/Throat:     Mouth: Mucous membranes are moist.     Pharynx: Oropharynx is clear.  Eyes:     Conjunctiva/sclera: Conjunctivae normal.  Cardiovascular:     Rate and Rhythm: Normal rate and regular rhythm.     Pulses: Normal pulses.     Heart sounds: Normal heart sounds.  Pulmonary:     Effort: Pulmonary effort is normal.     Breath sounds: Normal breath sounds.  Abdominal:     General: Bowel sounds are normal.     Palpations: Abdomen is soft.  Musculoskeletal:        General: Tenderness present.  Skin:    General: Skin is dry.     Findings: Erythema present.     Comments: Erythema and swelling to right hand pinky finger   Neurological:     General: No focal deficit present.     Mental Status: He is alert and oriented to person, place, and time.  Psychiatric:        Mood and Affect: Mood normal.        Behavior: Behavior normal.    Labs reviewed: Recent Labs    06/05/24 0426 06/06/24 0426 06/07/24 0523 06/08/24 0453 06/09/24 0502 06/19/24 0000 07/03/24 1459  07/14/24 0000  NA 133* 129* 134* 132* 132* 130* 136* 136*  K 3.4* 4.1 4.0 3.7 3.7 4.6 4.1 4.2  CL 104 99 105 102 98 95* 98* 100  CO2 22 21* 22 23 25  27* 29* 28*  GLUCOSE 87 92 95 96 106*  --   --   --   BUN 17 18 19 17 14 10 14 15   CREATININE 0.71 0.63 0.82 0.66 0.72 0.8 0.6 0.7  CALCIUM 8.6* 8.4* 8.5* 8.2* 8.7* 8.9 8.8 8.8  MG  --  1.9  --   --   --   --   --   --   PHOS 2.9  --   --   --   --   --   --   --    Recent Labs    04/28/24 1311 05/25/24 0513 06/01/24 0121 07/14/24 0000  AST 22 21 27 20   ALT 18 21 24 19   ALKPHOS 86 91 95 101  BILITOT 0.8 0.7 0.8  --   PROT 6.9 7.0 6.9  --   ALBUMIN 3.4* 3.4* 2.7* 3.1*   Recent Labs    06/07/24 0523 06/08/24 0453 06/09/24 0502 06/19/24 0000 06/22/24 0000 07/03/24 1459 07/14/24 0000  WBC 9.5 13.2* 13.2*   < > 14.4 10.1 7.7  NEUTROABS  --   --   --    < > 11,016.00 7,151.00 5,359.00  HGB 10.9* 11.3* 11.1*   < > 10.7* 11.6* 10.4*  HCT 31.0* 32.0* 32.1*   < > 32* 36* 32*  MCV 97.5 96.4 97.9  --   --   --   --   PLT 528* 554* 616*   < > 576* 571* 415*   < > = values in this interval not displayed.   Lab Results  Component Value Date   TSH 0.81 07/02/2024   No results found for: HGBA1C Lab Results  Component Value Date   CHOL 131 12/08/2014   HDL 47 12/08/2014   LDLCALC 65 12/08/2014   TRIG 93 12/08/2014    Significant Diagnostic Results in last 30  days:  No results found.  Assessment/Plan  1. Essential hypertension (Primary) - Chronic and stable condition. - Continue propranolol  and lisinopril . - CMP obtained August 2025.  2. Tenosynovitis of finger - Treated with irrigation and debridement (July 2025). Developed cellulitis, pain, and swelling on 08/05/24; completed course of doxycycline. - Scheduled follow-up with hand specialist next week. - Continue Tylenol  for pain relief.  3. Cognitive impairment - Ongoing forgetfulness, with wife assisting in responding to assessment. - Fall precautions and  supervision required; monitor for delirium.   4. Gastroesophageal reflux disease without esophagitis - Stable; continue omeprazole  as prescribed.  5. Urinary incontinence, unspecified type - Chronic; continue Myrbetriq  as prescribed; avoid excess fluids in the evening.  6. Constipation, unspecified constipation type - Stable; continue Senna and Miralax  as prescribed.  7. Pure hypercholesterolemia - Lipid panel obtained June 2025 - Continue pravastatin  as prescribed   Irfat Avie, AGPCNP Student - [facility] I personally was present during the history, physical exam and medical decision-making activities of this service and have verified that the service and findings are accurately documented in the student's note Alise Calais K. Caro BODILY [facility] [phone]

## 2024-08-12 NOTE — Progress Notes (Deleted)
 Location:  Other [place].  Nursing Home Room Number: [facility] [id number] Place of Service:  SNF (31) [name], NP  PCP: [name], MD  Patient Care Team: [name], MD as PCP - General (Family Medicine) [name], MD as Consulting Physician (Oncology)  Extended Emergency Contact Information Primary Emergency Contact: Hansell,[name]  [place] Home Phone: [phone] Mobile Phone: [phone] Relation: Spouse Secondary Emergency Contact: [name]  [place] Home Phone: [phone] Relation: Son  Goals of care: Advanced Directive information    07/07/2024   10:42 AM  Advanced Directives  Does Patient Have a Medical Advance Directive? Yes  Type of Estate agent of [place];Out of facility DNR (pink MOST or yellow form)  Does patient want to make changes to medical advance directive? No - Patient declined  Copy of Healthcare Power of Attorney in Chart? Yes - validated most recent copy scanned in chart (See row information)     Chief Complaint  Patient presents with   Medical Management of Chronic Issues    Medical Management of Chronic Issues.     HPI:  Pt is a 89 y.o. [place] seen today for medical management of chronic disease.    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   Leukocytosis    Prostate cancer (HCC)    Shingles    Past Surgical History:  Procedure Laterality Date   APPENDECTOMY     artificial left eye     EYE SURGERY     FLEXOR TENDON REPAIR Right 05/24/2024   Procedure: RIGHT FIFTH FINGER TENDON FLEXOR IRRIGATION AND DEBRIDEMENT;  Surgeon: Tobie Priest, MD;  Location: [facility] ORS;  Service: Orthopedics;  Laterality: Right;   HERNIA REPAIR     JOINT REPLACEMENT     PROSTATE SURGERY       Allergies  Allergen Reactions   Cephalexin  Diarrhea    C.diff   Hydrocodone-Acetaminophen  Nausea Only    Vomiting/dizziness    Outpatient Encounter Medications as of 08/12/2024  Medication Sig   acetaminophen  (TYLENOL ) 500 MG tablet Take 500 mg by mouth every 6 (six) hours as needed for mild pain. Reported on 01/24/2016   acetaminophen  (TYLENOL ) 500 MG tablet Take 1,000 mg by mouth at bedtime.   aspirin  EC 81 MG tablet Take 1 tablet (81 mg total) by mouth daily. Swallow whole.   bisacodyl  (DULCOLAX) 10 MG suppository Place 10 mg rectally daily as needed for moderate constipation.   cetaphil (CETAPHIL) lotion Apply 1 Application topically daily.   Cholecalciferol  (VITAMIN D3) 250 MCG (10000 UT) capsule Take 10,000 Units by mouth daily.   citalopram  (CELEXA ) 20 MG tablet Take 20 mg by mouth daily.   dorzolamide  (TRUSOPT ) 2 % ophthalmic solution Place 1 drop into the right eye 2 (two) times daily.   latanoprost  (XALATAN ) 0.005 % ophthalmic solution Place 1 drop into the right eye at bedtime.   lisinopril  (ZESTRIL ) 20 MG tablet Take 20 mg by mouth daily.   omeprazole  (PRILOSEC) 40 MG capsule TAKE 1 CAPSULE BY MOUTH DAILY USUALLY 30 MINUTES BEFORE BREAKFAST   polyethylene glycol (MIRALAX  / GLYCOLAX ) 17 g packet Take 17 g by mouth every 12 (twelve) hours as needed.   pravastatin  (PRAVACHOL ) 20 MG tablet Take 20 mg by mouth at bedtime.  propranolol  (INDERAL ) 40 MG tablet Take 1 tablet by mouth 2 (two) times daily.   senna (SENOKOT) 8.6 MG TABS tablet Take 2 tablets by mouth daily.   [Paused] eltrombopag  (PROMACTA ) 25 MG tablet TAKE 1 TABLET (25 MG TOTAL) BY MOUTH EVERY OTHER DAY. TAKE ON AN EMPTY STOMACH, 1 HOUR BEFORE A MEAL OR 2 HOURS AFTER. (Patient not taking: Reported on 08/12/2024)   mirabegron  ER (MYRBETRIQ ) 25 MG TB24 tablet Take 25 mg by mouth daily. (Patient not taking: Reported on 08/12/2024)   [Paused] mycophenolate  (CELLCEPT ) 500 MG tablet Take 1 tablet (500 mg total) by mouth  daily. (Patient not taking: Reported on 08/12/2024)   No facility-administered encounter medications on file as of 08/12/2024.    Review of Systems ***  Immunization History  Administered Date(s) Administered   INFLUENZA, HIGH DOSE SEASONAL PF 09/05/2021   Influenza Split 08/27/2014   Influenza Whole 07/21/2016   Influenza,inj,Quad PF,6+ Mos 09/01/2015   Influenza-Unspecified 09/03/2012, 08/20/2013, 08/27/2014, 09/12/2016, 08/27/2017, 08/10/2020, 08/31/2022   PFIZER(Purple Top)SARS-COV-2 Vaccination 11/24/2019, 12/15/2019   Pfizer(Comirnaty)Fall Seasonal Vaccine 12 years and older 08/29/2023   Pneumococcal Conjugate-13 10/02/2016   Td 09/09/2021   Tdap 05/14/2013   Zoster, Live 12/12/2012   Pertinent  Health Maintenance Due  Topic Date Due   Influenza Vaccine  06/20/2024      09/02/2021    2:52 PM 09/08/2021    6:37 PM 12/23/2021    2:51 PM 05/09/2022    2:25 PM 08/29/2022    2:00 PM  Fall Risk  (RETIRED) Patient Fall Risk Level Low fall risk  Low fall risk  Low fall risk  High fall risk  Low fall risk      Data saved with a previous flowsheet row definition   Functional Status Survey:    Vitals:   08/12/24 0844 08/12/24 0853  BP: (!) 142/80 131/78  Pulse: 66   Resp: 18   Temp: (!) 97.3 F (36.3 C)   SpO2: 94%   Weight: 161 lb 3.2 oz (73.1 kg)   Height: 5' 7 (1.702 m)    Body mass index is 25.25 kg/m. Physical Exam***  Labs reviewed: Recent Labs    06/05/24 0426 06/06/24 0426 06/07/24 0523 06/08/24 0453 06/09/24 0502 06/19/24 0000 07/03/24 1459 07/14/24 0000  NA 133* 129* 134* 132* 132* 130* 136* 136*  K 3.4* 4.1 4.0 3.7 3.7 4.6 4.1 4.2  CL 104 99 105 102 98 95* 98* 100  CO2 22 21* 22 23 25  27* 29* 28*  GLUCOSE 87 92 95 96 106*  --   --   --   BUN 17 18 19 17 14 10 14 15   CREATININE 0.71 0.63 0.82 0.66 0.72 0.8 0.6 0.7  CALCIUM 8.6* 8.4* 8.5* 8.2* 8.7* 8.9 8.8 8.8  MG  --  1.9  --   --   --   --   --   --   PHOS 2.9  --   --   --   --   --   --    --    Recent Labs    04/28/24 1311 05/25/24 0513 06/01/24 0121 07/14/24 0000  AST 22 21 27 20   ALT 18 21 24 19   ALKPHOS 86 91 95 101  BILITOT 0.8 0.7 0.8  --   PROT 6.9 7.0 6.9  --   ALBUMIN 3.4* 3.4* 2.7* 3.1*   Recent Labs    06/07/24 0523 06/08/24 0453 06/09/24 0502 06/19/24 0000 06/22/24 0000 07/03/24 1459 07/14/24 0000  WBC 9.5 13.2* 13.2*   < > 14.4 10.1 7.7  NEUTROABS  --   --   --    < > 11,016.00 7,151.00 5,359.00  HGB 10.9* 11.3* 11.1*   < > 10.7* 11.6* 10.4*  HCT 31.0* 32.0* 32.1*   < > 32* 36* 32*  MCV 97.5 96.4 97.9  --   --   --   --   PLT 528* 554* 616*   < > 576* 571* 415*   < > = values in this interval not displayed.   Lab Results  Component Value Date   TSH 0.81 07/02/2024   No results found for: HGBA1C Lab Results  Component Value Date   CHOL 131 12/08/2014   HDL 47 12/08/2014   LDLCALC 65 12/08/2014   TRIG 93 12/08/2014    Significant Diagnostic Results in last 30 days:  No results found.  Assessment/Plan No problem-specific Assessment & Plan notes found for this encounter.     Attallah Ontko K. Caro BODILY [facility] [phone]

## 2024-08-19 ENCOUNTER — Encounter: Payer: Self-pay | Admitting: Internal Medicine

## 2024-08-19 ENCOUNTER — Inpatient Hospital Stay: Admitting: Internal Medicine

## 2024-08-19 ENCOUNTER — Inpatient Hospital Stay

## 2024-08-19 VITALS — BP 151/73 | HR 64 | Temp 97.5°F | Resp 16 | Ht 67.0 in | Wt 168.0 lb

## 2024-08-19 DIAGNOSIS — Z7982 Long term (current) use of aspirin: Secondary | ICD-10-CM | POA: Diagnosis not present

## 2024-08-19 DIAGNOSIS — I10 Essential (primary) hypertension: Secondary | ICD-10-CM | POA: Diagnosis not present

## 2024-08-19 DIAGNOSIS — M2550 Pain in unspecified joint: Secondary | ICD-10-CM | POA: Diagnosis present

## 2024-08-19 DIAGNOSIS — D693 Immune thrombocytopenic purpura: Secondary | ICD-10-CM | POA: Diagnosis not present

## 2024-08-19 DIAGNOSIS — Z79899 Other long term (current) drug therapy: Secondary | ICD-10-CM | POA: Diagnosis not present

## 2024-08-19 DIAGNOSIS — Z79624 Long term (current) use of inhibitors of nucleotide synthesis: Secondary | ICD-10-CM | POA: Diagnosis not present

## 2024-08-19 DIAGNOSIS — Z809 Family history of malignant neoplasm, unspecified: Secondary | ICD-10-CM | POA: Diagnosis not present

## 2024-08-19 DIAGNOSIS — D591 Autoimmune hemolytic anemia, unspecified: Secondary | ICD-10-CM | POA: Diagnosis not present

## 2024-08-19 DIAGNOSIS — Z820 Family history of epilepsy and other diseases of the nervous system: Secondary | ICD-10-CM | POA: Diagnosis not present

## 2024-08-19 DIAGNOSIS — Z9049 Acquired absence of other specified parts of digestive tract: Secondary | ICD-10-CM | POA: Diagnosis not present

## 2024-08-19 DIAGNOSIS — D590 Drug-induced autoimmune hemolytic anemia: Secondary | ICD-10-CM

## 2024-08-19 DIAGNOSIS — Z881 Allergy status to other antibiotic agents status: Secondary | ICD-10-CM | POA: Diagnosis not present

## 2024-08-19 DIAGNOSIS — Z885 Allergy status to narcotic agent status: Secondary | ICD-10-CM | POA: Diagnosis not present

## 2024-08-19 LAB — CBC WITH DIFFERENTIAL (CANCER CENTER ONLY)
Abs Immature Granulocytes: 0.1 K/uL — ABNORMAL HIGH (ref 0.00–0.07)
Basophils Absolute: 0.1 K/uL (ref 0.0–0.1)
Basophils Relative: 1 %
Eosinophils Absolute: 0.5 K/uL (ref 0.0–0.5)
Eosinophils Relative: 6 %
HCT: 35.3 % — ABNORMAL LOW (ref 39.0–52.0)
Hemoglobin: 11.7 g/dL — ABNORMAL LOW (ref 13.0–17.0)
Immature Granulocytes: 1 %
Lymphocytes Relative: 15 %
Lymphs Abs: 1.3 K/uL (ref 0.7–4.0)
MCH: 33 pg (ref 26.0–34.0)
MCHC: 33.1 g/dL (ref 30.0–36.0)
MCV: 99.4 fL (ref 80.0–100.0)
Monocytes Absolute: 0.7 K/uL (ref 0.1–1.0)
Monocytes Relative: 8 %
Neutro Abs: 6.1 K/uL (ref 1.7–7.7)
Neutrophils Relative %: 69 %
Platelet Count: 317 K/uL (ref 150–400)
RBC: 3.55 MIL/uL — ABNORMAL LOW (ref 4.22–5.81)
RDW: 16.1 % — ABNORMAL HIGH (ref 11.5–15.5)
WBC Count: 8.8 K/uL (ref 4.0–10.5)
nRBC: 0 % (ref 0.0–0.2)

## 2024-08-19 LAB — CMP (CANCER CENTER ONLY)
ALT: 22 U/L (ref 0–44)
AST: 26 U/L (ref 15–41)
Albumin: 3.5 g/dL (ref 3.5–5.0)
Alkaline Phosphatase: 95 U/L (ref 38–126)
Anion gap: 7 (ref 5–15)
BUN: 16 mg/dL (ref 8–23)
CO2: 25 mmol/L (ref 22–32)
Calcium: 9.2 mg/dL (ref 8.9–10.3)
Chloride: 101 mmol/L (ref 98–111)
Creatinine: 0.87 mg/dL (ref 0.61–1.24)
GFR, Estimated: >60 mL/min (ref >60)
Glucose, Bld: 110 mg/dL — ABNORMAL HIGH (ref 70–99)
Potassium: 3.8 mmol/L (ref 3.5–5.1)
Sodium: 133 mmol/L — ABNORMAL LOW (ref 135–145)
Total Bilirubin: 0.9 mg/dL (ref 0.0–1.2)
Total Protein: 6.9 g/dL (ref 6.5–8.1)

## 2024-08-19 LAB — RETIC PANEL
Immature Retic Fract: 11.3 % (ref 2.3–15.9)
RBC.: 3.53 MIL/uL — ABNORMAL LOW (ref 4.22–5.81)
Retic Count, Absolute: 77 K/uL (ref 19.0–186.0)
Retic Ct Pct: 2.2 % (ref 0.4–3.1)
Reticulocyte Hemoglobin: 37.8 pg (ref >27.9)

## 2024-08-19 LAB — LACTATE DEHYDROGENASE: LDH: 149 U/L (ref 98–192)

## 2024-08-19 NOTE — Progress Notes (Signed)
  [facility] OFFICE PROGRESS NOTE  Patient Care Team: [name], MD as PCP - General (Family Medicine) [name], MD as Consulting Physician (Oncology)   SUMMARY OF ONCOLOGIC HISTORY:  #FEB 2016-  ITP  s/p Prednisone ; OCT 2016- Relapse of ITP- Prednisone ; JAN 13th- START RITUXAN  q W x4 [finished Feb 8th]; March 10th 2017- N-plate q W- good response; Dec 2018- promacta  25 mg qOD.   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   # Hx of nose bleeds  INTERVAL HISTORY: Walking independently.  Accompanied by his wife.  A very pleasant 89 year old male patient with a history of autoimmune hemolytic anemia/ITP [Patient currently taking 1 promacta  twice a week.] is here for follow-up.    Patient here for oncology follow-up appointment, concerns of confusion and off balance.   No lightheadedness or dizziness. Appetite is good. Denies any pain. No bleeding. No worsening fatigue.  Complains of joint pains.   Review of Systems  Constitutional:  Negative for chills, diaphoresis, fever, malaise/fatigue and weight loss.  HENT:  Negative for nosebleeds and sore throat.   Eyes:  Negative for double vision.  Respiratory:  Negative for cough, hemoptysis, sputum production, shortness of breath and wheezing.   Cardiovascular:  Negative for chest pain, palpitations, orthopnea and leg swelling.  Gastrointestinal:  Negative for abdominal pain, blood in stool, constipation, diarrhea, heartburn, melena, nausea and vomiting.  Genitourinary:  Negative for dysuria, frequency and urgency.  Musculoskeletal:  Positive for back pain and joint pain.  Skin: Negative.  Negative for itching and rash.  Neurological:  Negative for dizziness, tingling, focal weakness, weakness and headaches.  Endo/Heme/Allergies:  Does not bruise/bleed easily.  Psychiatric/Behavioral:  Negative for depression. The patient is not nervous/anxious  and does not have insomnia.     PAST MEDICAL HISTORY :  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   Leukocytosis    Prostate cancer (HCC)    Shingles     PAST SURGICAL HISTORY :   Past Surgical History:  Procedure Laterality Date   APPENDECTOMY     artificial left eye     EYE SURGERY     FLEXOR TENDON REPAIR Right 05/24/2024   Procedure: RIGHT FIFTH FINGER TENDON FLEXOR IRRIGATION AND DEBRIDEMENT;  Surgeon: Tobie Priest, MD;  Location: [facility] ORS;  Service: Orthopedics;  Laterality: Right;   HERNIA REPAIR     JOINT REPLACEMENT     PROSTATE SURGERY      FAMILY HISTORY :   Family History  Problem Relation Age of Onset   Cancer Mother    Multiple sclerosis Father     SOCIAL HISTORY:   Social History   Tobacco Use   Smoking status: Never   Smokeless tobacco: Never  Vaping Use   Vaping status: Never Used  Substance Use Topics   Alcohol  use: Yes    Comment: occ   Drug use: No    ALLERGIES:  is allergic to cephalexin  and hydrocodone-acetaminophen .  MEDICATIONS:  Current Outpatient Medications  Medication Sig Dispense Refill   acetaminophen  (TYLENOL ) 500 MG tablet Take 500 mg by mouth every 6 (six) hours as needed for mild pain. Reported on 01/24/2016     acetaminophen  (TYLENOL ) 500 MG tablet Take 1,000 mg by mouth at  bedtime.     aspirin  EC 81 MG tablet Take 1 tablet (81 mg total) by mouth daily. Swallow whole.     bisacodyl  (DULCOLAX) 10 MG suppository Place 10 mg rectally daily as needed for moderate constipation.     cetaphil (CETAPHIL) lotion Apply 1 Application topically daily.     Cholecalciferol  (VITAMIN D3) 250 MCG (10000 UT) capsule Take 10,000 Units by mouth daily.     citalopram  (CELEXA ) 20 MG tablet Take 20 mg by mouth daily.     dorzolamide  (TRUSOPT ) 2 % ophthalmic solution Place 1  drop into the right eye 2 (two) times daily.     latanoprost  (XALATAN ) 0.005 % ophthalmic solution Place 1 drop into the right eye at bedtime.     lisinopril  (ZESTRIL ) 20 MG tablet Take 20 mg by mouth daily.     omeprazole  (PRILOSEC) 40 MG capsule TAKE 1 CAPSULE BY MOUTH DAILY USUALLY 30 MINUTES BEFORE BREAKFAST 90 capsule 2   polyethylene glycol (MIRALAX  / GLYCOLAX ) 17 g packet Take 17 g by mouth every 12 (twelve) hours as needed.     pravastatin  (PRAVACHOL ) 20 MG tablet Take 20 mg by mouth at bedtime.      propranolol  (INDERAL ) 40 MG tablet Take 1 tablet by mouth 2 (two) times daily.     senna (SENOKOT) 8.6 MG TABS tablet Take 2 tablets by mouth daily.     [Paused] eltrombopag  (PROMACTA ) 25 MG tablet TAKE 1 TABLET (25 MG TOTAL) BY MOUTH EVERY OTHER DAY. TAKE ON AN EMPTY STOMACH, 1 HOUR BEFORE A MEAL OR 2 HOURS AFTER. (Patient not taking: Reported on 08/19/2024) 30 tablet 4   mirabegron  ER (MYRBETRIQ ) 25 MG TB24 tablet Take 25 mg by mouth daily. (Patient not taking: Reported on 08/19/2024)     [Paused] mycophenolate  (CELLCEPT ) 500 MG tablet Take 1 tablet (500 mg total) by mouth daily. (Patient not taking: Reported on 08/19/2024) 30 tablet 3   No current facility-administered medications for this visit.    PHYSICAL EXAMINATION:   BP (!) 151/73 (BP Location: Left Arm, Patient Position: Sitting, Cuff Size: Normal) Comment: pt advised bp elevated, keep check at home, contact pcp if con't to stay elevated  Pulse 64   Temp (!) 97.5 F (36.4 C) (Oral)   Resp 16   Ht 5' 7 (1.702 m)   Wt 168 lb (76.2 kg)   SpO2 99%   BMI 26.31 kg/m   Filed Weights   08/19/24 0914  Weight: 168 lb (76.2 kg)      Physical Exam HENT:     Head: Normocephalic and atraumatic.     Mouth/Throat:     Pharynx: No oropharyngeal exudate.  Eyes:     Pupils: Pupils are equal, round, and reactive to light.  Cardiovascular:     Rate and Rhythm: Normal rate and regular rhythm.     Heart sounds: Murmur heard.   Pulmonary:     Effort: No respiratory distress.     Breath sounds: No wheezing.  Abdominal:     General: Bowel sounds are normal. There is no distension.     Palpations: Abdomen is soft. There is no mass.     Tenderness: There is no abdominal tenderness. There is no guarding or rebound.  Musculoskeletal:        General: No tenderness. Normal range of motion.     Cervical back: Normal range of motion and neck supple.  Skin:    General: Skin is warm.  Neurological:     Mental  Status: He is alert and oriented to person, place, and time.  Psychiatric:        Mood and Affect: Affect normal.      LABORATORY DATA:  I have reviewed the data as listed    Component Value Date/Time   NA 133 (L) 08/19/2024 0914   NA 136 (A) 07/14/2024 0000   NA 131 (L) 03/17/2015 1425   K 3.8 08/19/2024 0914   K 3.9 03/17/2015 1425   CL 101 08/19/2024 0914   CL 101 03/17/2015 1425   CO2 25 08/19/2024 0914   CO2 26 03/17/2015 1425   GLUCOSE 110 (H) 08/19/2024 0914   GLUCOSE 161 (H) 03/17/2015 1425   BUN 16 08/19/2024 0914   BUN 15 07/14/2024 0000   BUN 17 03/17/2015 1425   CREATININE 0.87 08/19/2024 0914   CREATININE 0.90 03/17/2015 1425   CALCIUM 9.2 08/19/2024 0914   CALCIUM 8.5 (L) 03/17/2015 1425   PROT 6.9 08/19/2024 0914   PROT 6.7 03/17/2015 1425   ALBUMIN 3.5 08/19/2024 0914   ALBUMIN 3.7 03/17/2015 1425   AST 26 08/19/2024 0914   ALT 22 08/19/2024 0914   ALT 23 03/17/2015 1425   ALKPHOS 95 08/19/2024 0914   ALKPHOS 44 03/17/2015 1425   BILITOT 0.9 08/19/2024 0914   GFRNONAA >60 08/19/2024 0914   GFRNONAA >60 03/17/2015 1425   GFRAA >60 08/02/2020 1322   GFRAA >60 03/17/2015 1425    No results found for: SPEP, UPEP  Lab Results  Component Value Date   WBC 8.8 08/19/2024   NEUTROABS 6.1 08/19/2024   HGB 11.7 (L) 08/19/2024   HCT 35.3 (L) 08/19/2024   MCV 99.4 08/19/2024   PLT 317 08/19/2024      Chemistry      Component Value Date/Time   NA 133 (L) 08/19/2024  0914   NA 136 (A) 07/14/2024 0000   NA 131 (L) 03/17/2015 1425   K 3.8 08/19/2024 0914   K 3.9 03/17/2015 1425   CL 101 08/19/2024 0914   CL 101 03/17/2015 1425   CO2 25 08/19/2024 0914   CO2 26 03/17/2015 1425   BUN 16 08/19/2024 0914   BUN 15 07/14/2024 0000   BUN 17 03/17/2015 1425   CREATININE 0.87 08/19/2024 0914   CREATININE 0.90 03/17/2015 1425   GLU 83 07/14/2024 0000      Component Value Date/Time   CALCIUM 9.2 08/19/2024 0914   CALCIUM 8.5 (L) 03/17/2015 1425   ALKPHOS 95 08/19/2024 0914   ALKPHOS 44 03/17/2015 1425   AST 26 08/19/2024 0914   ALT 22 08/19/2024 0914   ALT 23 03/17/2015 1425   BILITOT 0.9 08/19/2024 0914       ASSESSMENT & PLAN:   Idiopathic thrombocytopenic purpura (HCC) #Chronic ITP/Hemolytic anemia  -  JULY -AUG 2025 [hospital] currently OFF [ Promacta  25 mg. On promacta  Monday/Thursdays given insurance issues/costs; OFF cellcept  500 mg a AM]-  hemoglobin -11.7- plat late- 270- monitor for now.  Continue monitoring off therapy.  # Dementia- slow progressive- defer to PCP.   # Prostatism: Elevated PSA [[facility]; Urology]- rising- 24- ? Hormone shot- no further follow up with Urology. Will monitor locally-  stable.   # DISPOSITION: # follow up in 4 months/labs-cbc/cmp/LDH; haptoglobin; reticulocyte count; PSA- Dr.B       Cindy JONELLE Joe, MD 08/19/2024 10:49 AM

## 2024-08-19 NOTE — Assessment & Plan Note (Addendum)
#  Chronic ITP/Hemolytic anemia  -  JULY -AUG 2025 [hospital] currently OFF [ Promacta  25 mg. On promacta  Monday/Thursdays given insurance issues/costs; OFF cellcept  500 mg a AM]-  hemoglobin -11.7- plat late- 270- monitor for now.  Continue monitoring off therapy.  # Dementia- slow progressive- defer to PCP.   # Prostatism: Elevated PSA [[facility]; Urology]- rising- 24- ? Hormone shot- no further follow up with Urology. Will monitor locally-  stable.   # DISPOSITION: # follow up in 4 months/labs-cbc/cmp/LDH; haptoglobin; reticulocyte count; PSA- Dr.B

## 2024-08-19 NOTE — Progress Notes (Signed)
 At [facility] rehab, was seen at [facility] for infection of finger and pneumonia 06/09/24.  Pt having urine incontinence. Wife states they have seen [facility] about his prostate, but not doing anything about it.

## 2024-08-20 LAB — HAPTOGLOBIN: Haptoglobin: 304 mg/dL (ref 38–329)

## 2024-09-01 ENCOUNTER — Other Ambulatory Visit

## 2024-09-01 ENCOUNTER — Encounter: Payer: Self-pay | Admitting: Adult Health

## 2024-09-01 ENCOUNTER — Non-Acute Institutional Stay (SKILLED_NURSING_FACILITY): Payer: Self-pay | Admitting: Adult Health

## 2024-09-01 ENCOUNTER — Ambulatory Visit: Admitting: Internal Medicine

## 2024-09-01 DIAGNOSIS — F5101 Primary insomnia: Secondary | ICD-10-CM

## 2024-09-01 DIAGNOSIS — R310 Gross hematuria: Secondary | ICD-10-CM

## 2024-09-01 NOTE — Progress Notes (Unsigned)
 Location:  Other [facility]) Nursing Home Room Number: [place] [facility] [facility]) Place of Service:  SNF ([phone]) Provider:  Medina-Vargas, Jarielys Girardot, DNP, FNP-BC  Patient Care Team: [name], MD as PCP - General (Family Medicine) [name], MD as Consulting Physician (Oncology)  Extended Emergency Contact Information Primary Emergency Contact: Dimattia,[name]  [place] Home Phone: [phone] Mobile Phone: [phone] Relation: Spouse Secondary Emergency Contact: Mehul, Rudin  [place] Home Phone: [phone] Relation: Son  Code Status:  DNR  Goals of care: Advanced Directive information    09/01/2024    9:55 AM  Advanced Directives  Does Patient Have a Medical Advance Directive? Yes  Type of Estate agent of [place];Out of facility DNR (pink MOST or yellow form);Living will  Does patient want to make changes to medical advance directive? No - Patient declined  Copy of Healthcare Power of Attorney in Chart? Yes - validated most recent copy scanned in chart (See row information)  Would patient like information on creating a medical advance directive? No - Patient declined     Chief Complaint  Patient presents with   Insomnia    acute visit difficulty sleeping at night      HPI:  Pt is a 89 y.o. male seen today for medical management of chronic diseases.  ***   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   Leukocytosis    Prostate cancer (HCC)    Shingles    Past Surgical History:  Procedure Laterality Date   APPENDECTOMY     artificial left eye     EYE SURGERY     FLEXOR TENDON REPAIR Right 05/24/2024   Procedure: RIGHT FIFTH FINGER TENDON FLEXOR IRRIGATION AND DEBRIDEMENT;  Surgeon: Tobie Priest, MD;   Location: [facility] ORS;  Service: Orthopedics;  Laterality: Right;   HERNIA REPAIR     JOINT REPLACEMENT     PROSTATE SURGERY      Allergies  Allergen Reactions   Cephalexin  Diarrhea    C.diff   Hydrocodone-Acetaminophen  Nausea Only    Vomiting/dizziness    Outpatient Encounter Medications as of 09/01/2024  Medication Sig   acetaminophen  (TYLENOL ) 500 MG tablet Take 500 mg by mouth every 6 (six) hours as needed for mild pain. Reported on 01/24/2016   acetaminophen  (TYLENOL ) 500 MG tablet Take 1,000 mg by mouth at bedtime.   aspirin  EC 81 MG tablet Take 1 tablet (81 mg total) by mouth daily. Swallow whole.   cetaphil (CETAPHIL) lotion Apply 1 Application topically daily.   Cholecalciferol  (VITAMIN D3) 250 MCG (10000 UT) capsule Take 10,000 Units by mouth daily.   citalopram  (CELEXA ) 20 MG tablet Take 20 mg by mouth daily.   dorzolamide  (TRUSOPT ) 2 % ophthalmic solution Place 1 drop into the right eye 2 (two) times daily.   latanoprost  (XALATAN ) 0.005 % ophthalmic solution Place 1 drop into the right eye at bedtime.   lisinopril  (ZESTRIL ) 20 MG tablet Take 20 mg by mouth daily.   omeprazole  (PRILOSEC) 40 MG capsule TAKE 1 CAPSULE BY MOUTH DAILY USUALLY 30 MINUTES BEFORE BREAKFAST   polyethylene glycol (MIRALAX  / GLYCOLAX ) 17 g packet Take 17 g by mouth every 12 (twelve) hours as needed.   pravastatin  (PRAVACHOL ) 20 MG tablet Take 20 mg by mouth at  bedtime.    propranolol  (INDERAL ) 40 MG tablet Take 1 tablet by mouth 2 (two) times daily.   senna (SENOKOT) 8.6 MG TABS tablet Take 2 tablets by mouth daily.   bisacodyl  (DULCOLAX) 10 MG suppository Place 10 mg rectally daily as needed for moderate constipation. (Patient not taking: Reported on 09/01/2024)   [Paused] eltrombopag  (PROMACTA ) 25 MG tablet TAKE 1 TABLET (25 MG TOTAL) BY MOUTH EVERY OTHER DAY. TAKE ON AN EMPTY STOMACH, 1 HOUR BEFORE A MEAL OR 2 HOURS AFTER. (Patient not taking: Reported on 09/01/2024)   mirabegron  ER (MYRBETRIQ ) 25 MG  TB24 tablet Take 25 mg by mouth daily. (Patient not taking: Reported on 09/01/2024)   [Paused] mycophenolate  (CELLCEPT ) 500 MG tablet Take 1 tablet (500 mg total) by mouth daily. (Patient not taking: Reported on 09/01/2024)   No facility-administered encounter medications on file as of 09/01/2024.    Review of Systems ***    Immunization History  Administered Date(s) Administered   INFLUENZA, HIGH DOSE SEASONAL PF 09/05/2021   Influenza Split 08/27/2014   Influenza Whole 07/21/2016   Influenza,inj,Quad PF,6+ Mos 09/01/2015   Influenza-Unspecified 09/03/2012, 08/20/2013, 08/27/2014, 09/12/2016, 08/27/2017, 08/10/2020, 08/31/2022   PFIZER(Purple Top)SARS-COV-2 Vaccination 11/24/2019, 12/15/2019   Pfizer(Comirnaty)Fall Seasonal Vaccine 12 years and older 08/29/2023   Pneumococcal Conjugate-13 10/02/2016   Td 09/09/2021   Tdap 05/14/2013   Zoster, Live 12/12/2012   Pertinent  Health Maintenance Due  Topic Date Due   Influenza Vaccine  06/20/2024      09/02/2021    2:52 PM 09/08/2021    6:37 PM 12/23/2021    2:51 PM 05/09/2022    2:25 PM 08/29/2022    2:00 PM  Fall Risk  (RETIRED) Patient Fall Risk Level Low fall risk  Low fall risk  Low fall risk  High fall risk  Low fall risk      Data saved with a previous flowsheet row definition     Vitals:   09/01/24 0958  BP: (!) 154/81  Pulse: 80  Resp: 20  Temp: 98 F (36.7 C)  SpO2: 94%  Weight: 177 lb 6.4 oz (80.5 kg)  Height: 5' 5 (1.651 m)   Body mass index is 29.52 kg/m.  Physical Exam     Labs reviewed: Recent Labs    06/05/24 0426 06/06/24 0426 06/07/24 0523 06/08/24 0453 06/09/24 0502 06/19/24 0000 07/03/24 1459 07/14/24 0000 08/19/24 0914  NA 133* 129*   < > 132* 132*   < > 136* 136* 133*  K 3.4* 4.1   < > 3.7 3.7   < > 4.1 4.2 3.8  CL 104 99   < > 102 98   < > 98* 100 101  CO2 22 21*   < > 23 25   < > 29* 28* 25  GLUCOSE 87 92   < > 96 106*  --   --   --  110*  BUN 17 18   < > 17 14   < > 14 15  16   CREATININE 0.71 0.63   < > 0.66 0.72   < > 0.6 0.7 0.87  CALCIUM 8.6* 8.4*   < > 8.2* 8.7*   < > 8.8 8.8 9.2  MG  --  1.9  --   --   --   --   --   --   --   PHOS 2.9  --   --   --   --   --   --   --   --    < > =  values in this interval not displayed.   Recent Labs    05/25/24 0513 06/01/24 0121 07/14/24 0000 08/19/24 0914  AST 21 27 20 26   ALT 21 24 19 22   ALKPHOS 91 95 101 95  BILITOT 0.7 0.8  --  0.9  PROT 7.0 6.9  --  6.9  ALBUMIN 3.4* 2.7* 3.1* 3.5   Recent Labs    06/08/24 0453 06/09/24 0502 06/19/24 0000 07/03/24 1459 07/14/24 0000 08/19/24 0913  WBC 13.2* 13.2*   < > 10.1 7.7 8.8  NEUTROABS  --   --    < > 7,151.00 5,359.00 6.1  HGB 11.3* 11.1*   < > 11.6* 10.4* 11.7*  HCT 32.0* 32.1*   < > 36* 32* 35.3*  MCV 96.4 97.9  --   --   --  99.4  PLT 554* 616*   < > 571* 415* 317   < > = values in this interval not displayed.   Lab Results  Component Value Date   TSH 0.81 07/02/2024   No results found for: HGBA1C Lab Results  Component Value Date   CHOL 131 12/08/2014   HDL 47 12/08/2014   LDLCALC 65 12/08/2014   TRIG 93 12/08/2014    Significant Diagnostic Results in last 30 days:  No results found.  Assessment/Plan ***   Family/ staff Communication: Discussed plan of care with resident and charge nurse  Labs/tests ordered:     Siyah Mault Medina-Vargas, DNP, MSN, FNP-BC [facility] [phone] (Monday-Friday 8:00 a.m. - 5:00 p.m.) [phone] (after hours)

## 2024-09-04 LAB — BASIC METABOLIC PANEL WITH GFR
BUN: 14 (ref 4–21)
CO2: 24 — AB (ref 13–22)
Chloride: 100 (ref 99–108)
Creatinine: 1 (ref 0.6–1.3)
Glucose: 78
Potassium: 4.1 meq/L (ref 3.5–5.1)
Sodium: 134 — AB (ref 137–147)

## 2024-09-04 LAB — CBC AND DIFFERENTIAL
HCT: 34 — AB (ref 41–53)
Hemoglobin: 11.1 — AB (ref 13.5–17.5)
Neutrophils Absolute: 7060
Platelets: 328 K/uL (ref 150–400)
WBC: 10.6

## 2024-09-04 LAB — COMPREHENSIVE METABOLIC PANEL WITH GFR
Calcium: 9 (ref 8.7–10.7)
eGFR: 75

## 2024-09-04 LAB — CBC: RBC: 3.36 — AB (ref 3.87–5.11)

## 2024-09-05 ENCOUNTER — Non-Acute Institutional Stay (SKILLED_NURSING_FACILITY): Payer: Self-pay | Admitting: Orthopedic Surgery

## 2024-09-05 ENCOUNTER — Encounter: Payer: Self-pay | Admitting: Orthopedic Surgery

## 2024-09-05 DIAGNOSIS — E7800 Pure hypercholesterolemia, unspecified: Secondary | ICD-10-CM | POA: Diagnosis not present

## 2024-09-05 DIAGNOSIS — M65949 Unspecified synovitis and tenosynovitis, unspecified hand: Secondary | ICD-10-CM

## 2024-09-05 DIAGNOSIS — D693 Immune thrombocytopenic purpura: Secondary | ICD-10-CM

## 2024-09-05 DIAGNOSIS — F5101 Primary insomnia: Secondary | ICD-10-CM | POA: Diagnosis not present

## 2024-09-05 DIAGNOSIS — Z8546 Personal history of malignant neoplasm of prostate: Secondary | ICD-10-CM

## 2024-09-05 DIAGNOSIS — R4189 Other symptoms and signs involving cognitive functions and awareness: Secondary | ICD-10-CM | POA: Diagnosis not present

## 2024-09-05 DIAGNOSIS — I10 Essential (primary) hypertension: Secondary | ICD-10-CM | POA: Diagnosis not present

## 2024-09-05 DIAGNOSIS — N3001 Acute cystitis with hematuria: Secondary | ICD-10-CM | POA: Diagnosis not present

## 2024-09-05 DIAGNOSIS — K219 Gastro-esophageal reflux disease without esophagitis: Secondary | ICD-10-CM | POA: Diagnosis not present

## 2024-09-05 NOTE — Progress Notes (Unsigned)
 Location:  [facility] Room Number: [place] [address] Place of Service:  SNF [phone]) Provider:  Greig Cluster, NP   [name], MD  Patient Care Team: [name], MD as PCP - General (Family Medicine) [name], MD as Consulting Physician (Oncology)  Extended Emergency Contact Information Primary Emergency Contact: Heeter,[name]  [place] Home Phone: [phone] Mobile Phone: [phone] Relation: Spouse Secondary Emergency Contact: Quaron, Delacruz  [place] Home Phone: [phone] Relation: Son  Code Status:  DNR Goals of care: Advanced Directive information    09/01/2024    9:55 AM  Advanced Directives  Does Patient Have a Medical Advance Directive? Yes  Type of Estate agent of [place];Out of facility DNR (pink MOST or yellow form);Living will  Does patient want to make changes to medical advance directive? No - Patient declined  Copy of Healthcare Power of Attorney in Chart? Yes - validated most recent copy scanned in chart (See row information)  Would patient like information on creating a medical advance directive? No - Patient declined     Chief Complaint  Patient presents with  . Routine Visit    HPI:  Pt is a 89 y.o. male seen today for medical management of chronic diseases.    He currently resides on the skilled nursing unit at [facility]. PMH: HTN, HLD, CAP, right retinal detachment, ARF, prostate cancer s/p TURP 2017> active surveillance, ITP and unstable gait.   ITP-  HTN- BUN/creat 14/1.0 09/04/2024, remains on lisinopril  and propranolol  HLD- total 134, LDL 60, remains on atorvastatin Cognitive impairment GERD- hgb 11.1, remains on omeprazole  Urinary incontinence-  Acute cystitis- 10/13 hematuria noted, recent urine culture + > 100,000 cfu/mL E.Coli, 10/17 Cipro  started  Insomnia- 10/13 melatonin started Tenosynovitis of finger- I/D 05/2024, cultures  were negative,  H/o prostate cancer- s/p TURP 2017, remains under surveillance    Past Medical History:  Diagnosis Date  . Autoimmune hemolytic anemia (HCC)   . Dehydration   . Detached retina   . Hypercholesteremia   . Hypertension   . Insomnia   . 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 Leukocytosis   . Prostate cancer (HCC)   . Shingles    Past Surgical History:  Procedure Laterality Date  . APPENDECTOMY    . artificial left eye    . EYE SURGERY    . FLEXOR TENDON REPAIR Right 05/24/2024   Procedure: RIGHT FIFTH FINGER TENDON FLEXOR IRRIGATION AND DEBRIDEMENT;  Surgeon: Tobie Priest, MD;  Location: [facility] ORS;  Service: Orthopedics;  Laterality: Right;  . HERNIA REPAIR    . JOINT REPLACEMENT    . PROSTATE SURGERY      Allergies  Allergen Reactions  . Cephalexin  Diarrhea    C.diff  . Hydrocodone-Acetaminophen  Nausea Only    Vomiting/dizziness    Outpatient Encounter Medications as of 09/05/2024  Medication Sig  . acetaminophen  (TYLENOL ) 500 MG tablet Take 500 mg by mouth every 6 (six) hours as needed for mild pain. Reported on 01/24/2016  . acetaminophen  (TYLENOL ) 500 MG tablet Take 1,000 mg by mouth at bedtime.  . aspirin  EC 81 MG tablet Take 1 tablet (81 mg total) by mouth daily. Swallow whole.  . cetaphil (CETAPHIL) lotion Apply 1 Application topically daily.  . Cholecalciferol  (VITAMIN D3) 250 MCG (10000 UT) capsule Take 10,000 Units by mouth daily.  . ciprofloxacin  (CIPRO ) 500 MG tablet Take 500 mg by mouth 2 (two) times daily.  . citalopram  (CELEXA ) 20 MG  tablet Take 20 mg by mouth daily.  . dorzolamide  (TRUSOPT ) 2 % ophthalmic solution Place 1 drop into the right eye 2 (two) times daily.  . latanoprost  (XALATAN ) 0.005 % ophthalmic solution Place 1 drop into the right eye at bedtime.  . lisinopril  (ZESTRIL ) 20 MG tablet Take 20 mg by mouth daily.  . melatonin 5 MG TABS Take 5 mg by mouth at bedtime.  . omeprazole  (PRILOSEC) 40 MG capsule TAKE 1  CAPSULE BY MOUTH DAILY USUALLY 30 MINUTES BEFORE BREAKFAST  . polyethylene glycol (MIRALAX  / GLYCOLAX ) 17 g packet Take 17 g by mouth every 12 (twelve) hours as needed.  . pravastatin  (PRAVACHOL ) 20 MG tablet Take 20 mg by mouth at bedtime.   . propranolol  (INDERAL ) 40 MG tablet Take 1 tablet by mouth 2 (two) times daily.  [name] senna (SENOKOT) 8.6 MG TABS tablet Take 2 tablets by mouth daily.  . sulfamethoxazole -trimethoprim  (BACTRIM  DS) 800-160 MG tablet Take 1 tablet by mouth 2 (two) times daily. Give 1 tablet by mouth two times a day for Hematuria for 5 Days  . bisacodyl  (DULCOLAX) 10 MG suppository Place 10 mg rectally daily as needed for moderate constipation. (Patient not taking: Reported on 09/05/2024)  . [Paused] eltrombopag  (PROMACTA ) 25 MG tablet TAKE 1 TABLET (25 MG TOTAL) BY MOUTH EVERY OTHER DAY. TAKE ON AN EMPTY STOMACH, 1 HOUR BEFORE A MEAL OR 2 HOURS AFTER. (Patient not taking: Reported on 09/05/2024)  . mirabegron  ER (MYRBETRIQ ) 25 MG TB24 tablet Take 25 mg by mouth daily. (Patient not taking: Reported on 09/05/2024)  . [Paused] mycophenolate  (CELLCEPT ) 500 MG tablet Take 1 tablet (500 mg total) by mouth daily. (Patient not taking: Reported on 09/05/2024)   No facility-administered encounter medications on file as of 09/05/2024.    Review of Systems  Immunization History  Administered Date(s) Administered  . INFLUENZA, HIGH DOSE SEASONAL PF 09/05/2021  . Influenza Split 08/27/2014  . Influenza Whole 07/21/2016  . Influenza,inj,Quad PF,6+ Mos 09/01/2015  . Influenza-Unspecified 09/03/2012, 08/20/2013, 08/27/2014, 09/12/2016, 08/27/2017, 08/10/2020, 08/31/2022, 09/02/2024  . PFIZER(Purple Top)SARS-COV-2 Vaccination 11/24/2019, 12/15/2019  . Pfizer(Comirnaty)Fall Seasonal Vaccine 12 years and older 08/29/2023  . Pneumococcal Conjugate-13 10/02/2016  . Td 09/09/2021  . Tdap 05/14/2013  . Zoster, Live 12/12/2012   Pertinent  Health Maintenance Due  Topic Date Due  .  Influenza Vaccine  Completed      09/02/2021    2:52 PM 09/08/2021    6:37 PM 12/23/2021    2:51 PM 05/09/2022    2:25 PM 08/29/2022    2:00 PM  Fall Risk  (RETIRED) Patient Fall Risk Level Low fall risk  Low fall risk  Low fall risk  High fall risk  Low fall risk      Data saved with a previous flowsheet row definition   Functional Status Survey:    Vitals:   09/05/24 1147  BP: 135/78  Pulse: 71  Resp: 18  Temp: (!) 95 F (35 C)  SpO2: 95%  Weight: 171 lb 6.4 oz (77.7 kg)  Height: 5' 5 (1.651 m)   Body mass index is 28.52 kg/m. Physical Exam  Labs reviewed: Recent Labs    06/05/24 0426 06/06/24 0426 06/07/24 0523 06/08/24 0453 06/09/24 0502 06/19/24 0000 07/14/24 0000 08/19/24 0914 09/04/24 0000  NA 133* 129*   < > 132* 132*   < > 136* 133* 134*  K 3.4* 4.1   < > 3.7 3.7   < > 4.2 3.8 4.1  CL 104 99   < >  102 98   < > 100 101 100  CO2 22 21*   < > 23 25   < > 28* 25 24*  GLUCOSE 87 92   < > 96 106*  --   --  110*  --   BUN 17 18   < > 17 14   < > 15 16 14   CREATININE 0.71 0.63   < > 0.66 0.72   < > 0.7 0.87 1.0  CALCIUM 8.6* 8.4*   < > 8.2* 8.7*   < > 8.8 9.2 9.0  MG  --  1.9  --   --   --   --   --   --   --   PHOS 2.9  --   --   --   --   --   --   --   --    < > = values in this interval not displayed.   Recent Labs    05/25/24 0513 06/01/24 0121 07/14/24 0000 08/19/24 0914  AST 21 27 20 26   ALT 21 24 19 22   ALKPHOS 91 95 101 95  BILITOT 0.7 0.8  --  0.9  PROT 7.0 6.9  --  6.9  ALBUMIN 3.4* 2.7* 3.1* 3.5   Recent Labs    06/08/24 0453 06/09/24 0502 06/19/24 0000 07/14/24 0000 08/19/24 0913 09/04/24 0000  WBC 13.2* 13.2*   < > 7.7 8.8 10.6  NEUTROABS  --   --    < > 5,359.00 6.1 7,060.00  HGB 11.3* 11.1*   < > 10.4* 11.7* 11.1*  HCT 32.0* 32.1*   < > 32* 35.3* 34*  MCV 96.4 97.9  --   --  99.4  --   PLT 554* 616*   < > 415* 317 328   < > = values in this interval not displayed.   Lab Results  Component Value Date   TSH 0.81  07/02/2024   No results found for: HGBA1C Lab Results  Component Value Date   CHOL 131 12/08/2014   HDL 47 12/08/2014   LDLCALC 65 12/08/2014   TRIG 93 12/08/2014    Significant Diagnostic Results in last 30 days:  No results found.  Assessment/Plan There are no diagnoses linked to this encounter.   Family/ staff Communication: ***  Labs/tests ordered:  ***

## 2024-09-09 ENCOUNTER — Non-Acute Institutional Stay (SKILLED_NURSING_FACILITY): Payer: Self-pay | Admitting: Internal Medicine

## 2024-09-09 ENCOUNTER — Encounter: Payer: Self-pay | Admitting: Internal Medicine

## 2024-09-09 DIAGNOSIS — I10 Essential (primary) hypertension: Secondary | ICD-10-CM | POA: Diagnosis not present

## 2024-09-09 DIAGNOSIS — D693 Immune thrombocytopenic purpura: Secondary | ICD-10-CM

## 2024-09-09 NOTE — Progress Notes (Signed)
 [facility] Acute Care Progress Note    Location:  Other [place].  Nursing Home Room Number: [facility] [id number] Place of Service:  SNF (31) Camellia Door, DO   ERE:Ajajnqq, Jerona, MD   Patient Care Team: Diedra Jerona, MD as PCP - General (Family Medicine) [name], MD as Consulting Physician (Oncology)   Extended Emergency Contact Information Primary Emergency Contact: Laverdiere,[name]  [place] Home Phone: [phone] Mobile Phone: [phone] Relation: Spouse Secondary Emergency Contact: Raunak, Antuna  [place] Home Phone: [phone] Relation: Son   Goals of care: Advanced Directive information    09/01/2024    9:55 AM  Advanced Directives  Does Patient Have a Medical Advance Directive? Yes  Type of Estate agent of [place];Out of facility DNR (pink MOST or yellow form);Living will  Does patient want to make changes to medical advance directive? No - Patient declined  Copy of Healthcare Power of Attorney in Chart? Yes - validated most recent copy scanned in chart (See row information)  Would patient like information on creating a medical advance directive? No - Patient declined     CODE STATUS: Do Not Resuscitate (DNR)   Chief Complaint  Patient presents with   Hypertension    Hypertension.      HPI: Pt is a 89 y.o. male seen today for an acute visit for Hypertension.  Met with the wife at the patient's side.  He had been admitted to the hospital due to tenosynovitis of the right hand requiring surgery.  He spent about 9 days in the hospital.  He has been back-and-forth with other infections since then.  He has had a difficult time recovering from his loss strength.  Wife thinks that the patient may need to become a long-term care resident due to lack of progress.  Nursing has noted the patient's blood pressure has been running higher with systolic blood pressures in the 190s.  Patient  is asymptomatic.  He is ready on propranolol  40 mg twice daily and lisinopril  20 mg daily.  He has heart rates occasionally in the low 60s.  Normally it is about 70 beats a minute.  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  He also played for [employer] on their football team.  Wife states that the patient is starting to suffer from some memory loss.  Wife is asking why he is on aspirin  due to his prior history of ITP.  His therapy with CellCept  and Promacta  is currently on hold until he follows up with his hematologist.  His last CBC from September 04, 2024 shows a white count of 8.8, hemoglobin of 11.7, platelets of 317.       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   History of ITP 03/22/2015   Hypercholesteremia    Hypertension    Insomnia    ITP (idiopathic thrombocytopenic purpura) 09/01/2015   Leukocytosis    Lobar pneumonia 06/03/2024   Nasal bleeding 01/08/2016   Prostate cancer (HCC)    Shingles    Syncope 12/21/2014   Tenosynovitis of finger 05/25/2024   Tenosynovitis of finger and hand 05/24/2024   Thrombocytosis 06/09/2024   Past Surgical History:  Procedure Laterality Date   APPENDECTOMY     artificial  left eye     EYE SURGERY     FLEXOR TENDON REPAIR Right 05/24/2024   Procedure: RIGHT FIFTH FINGER TENDON FLEXOR IRRIGATION AND DEBRIDEMENT;  Surgeon: Tobie Priest, MD;  Location: [facility] ORS;  Service: Orthopedics;  Laterality: Right;   HERNIA REPAIR     JOINT REPLACEMENT     PROSTATE SURGERY       Allergies  Allergen Reactions   Cephalexin  Diarrhea    C.diff   Hydrocodone-Acetaminophen  Nausea Only    Vomiting/dizziness     Outpatient Encounter Medications as of 09/09/2024  Medication Sig   acetaminophen  (TYLENOL ) 500 MG tablet Take  500 mg by mouth every 6 (six) hours as needed for mild pain. Reported on 01/24/2016   acetaminophen  (TYLENOL ) 500 MG tablet Take 1,000 mg by mouth at bedtime.   amLODipine  (NORVASC ) 10 MG tablet Take 10 mg by mouth daily.   aspirin  EC 81 MG tablet Take 1 tablet (81 mg total) by mouth daily. Swallow whole.   cetaphil (CETAPHIL) lotion Apply 1 Application topically daily.   Cholecalciferol  (VITAMIN D3) 250 MCG (10000 UT) capsule Take 10,000 Units by mouth daily.   ciprofloxacin  (CIPRO ) 500 MG tablet Take 500 mg by mouth 2 (two) times daily.   citalopram  (CELEXA ) 20 MG tablet Take 20 mg by mouth daily.   dorzolamide  (TRUSOPT ) 2 % ophthalmic solution Place 1 drop into the right eye 2 (two) times daily.   latanoprost  (XALATAN ) 0.005 % ophthalmic solution Place 1 drop into the right eye at bedtime.   lisinopril  (ZESTRIL ) 20 MG tablet Take 20 mg by mouth daily.   melatonin 5 MG TABS Take 5 mg by mouth at bedtime.   omeprazole  (PRILOSEC) 40 MG capsule TAKE 1 CAPSULE BY MOUTH DAILY USUALLY 30 MINUTES BEFORE BREAKFAST   polyethylene glycol (MIRALAX  / GLYCOLAX ) 17 g packet Take 17 g by mouth every 12 (twelve) hours as needed.   pravastatin  (PRAVACHOL ) 20 MG tablet Take 20 mg by mouth at bedtime.    propranolol  (INDERAL ) 40 MG tablet Take 1 tablet by mouth 2 (two) times daily.   senna (SENOKOT) 8.6 MG TABS tablet Take 2 tablets by mouth daily.   [Paused] eltrombopag  (PROMACTA ) 25 MG tablet TAKE 1 TABLET (25 MG TOTAL) BY MOUTH EVERY OTHER DAY. TAKE ON AN EMPTY STOMACH, 1 HOUR BEFORE A MEAL OR 2 HOURS AFTER. (Patient not taking: Reported on 09/09/2024)   [Paused] mycophenolate  (CELLCEPT ) 500 MG tablet Take 1 tablet (500 mg total) by mouth daily. (Patient not taking: Reported on 09/09/2024)   No facility-administered encounter medications on file as of 09/09/2024.     Review of Systems  Constitutional: Negative.   HENT: Negative.    Eyes: Negative.   Respiratory: Negative.    Gastrointestinal: Negative.    Endocrine: Negative.   Genitourinary: Negative.   Musculoskeletal:  Positive for gait problem.       Continue weakness in his legs.  Difficulty with walking.  Chronic since July.  Allergic/Immunologic: Negative.   Hematological: Negative.   Psychiatric/Behavioral: Negative.    All other systems reviewed and are negative.    Immunization History  Administered Date(s) Administered   INFLUENZA, HIGH DOSE SEASONAL PF 09/05/2021   Influenza Split 08/27/2014   Influenza Whole 07/21/2016   Influenza,inj,Quad PF,6+ Mos 09/01/2015   Influenza-Unspecified 09/03/2012, 08/20/2013, 08/27/2014, 09/12/2016, 08/27/2017, 08/10/2020, 08/31/2022, 09/02/2024   PFIZER(Purple Top)SARS-COV-2 Vaccination 11/24/2019, 12/15/2019   Pfizer(Comirnaty)Fall Seasonal Vaccine 12 years and older 08/29/2023   Pneumococcal Conjugate-13 10/02/2016   Td 09/09/2021  Tdap 05/14/2013   Zoster, Live 12/12/2012   Pertinent  Health Maintenance Due  Topic Date Due   Influenza Vaccine  Completed      09/08/2021    6:37 PM 12/23/2021    2:51 PM 05/09/2022    2:25 PM 08/29/2022    2:00 PM 09/07/2024    5:57 PM  Fall Risk  Falls in the past year?     1  Was there an injury with Fall?     0  Fall Risk Category Calculator     1  (RETIRED) Patient Fall Risk Level Low fall risk  Low fall risk  High fall risk  Low fall risk    Patient at Risk for Falls Due to     Impaired balance/gait  Fall risk Follow up     Falls evaluation completed     Data saved with a previous flowsheet row definition   Functional Status Survey:     Vitals:   09/09/24 1603  BP: 136/86  Pulse: 62  Resp: 19  Temp: (!) 97.1 F (36.2 C)  SpO2: 97%  Weight: 171 lb 6.4 oz (77.7 kg)  Height: 5' 5 (1.651 m)   Body mass index is 28.52 kg/m. Physical Exam Vitals and nursing note reviewed.  Constitutional:      General: He is not in acute distress.    Appearance: He is normal weight. He is not toxic-appearing or diaphoretic.  HENT:      Head: Normocephalic and atraumatic.  Eyes:     General: No scleral icterus. Cardiovascular:     Rate and Rhythm: Normal rate and regular rhythm.  Pulmonary:     Effort: Pulmonary effort is normal. No respiratory distress.     Breath sounds: No wheezing or rales.  Abdominal:     General: Bowel sounds are normal.     Palpations: Abdomen is soft.  Musculoskeletal:     Right lower leg: No edema.     Left lower leg: No edema.  Skin:    General: Skin is warm and dry.     Capillary Refill: Capillary refill takes less than 2 seconds.  Neurological:     Mental Status: He is alert and oriented to person, place, and time.      Labs reviewed: Recent Labs    06/05/24 0426 06/06/24 0426 06/07/24 0523 06/08/24 0453 06/09/24 0502 06/19/24 0000 07/14/24 0000 08/19/24 0914 09/04/24 0000  NA 133* 129*   < > 132* 132*   < > 136* 133* 134*  K 3.4* 4.1   < > 3.7 3.7   < > 4.2 3.8 4.1  CL 104 99   < > 102 98   < > 100 101 100  CO2 22 21*   < > 23 25   < > 28* 25 24*  GLUCOSE 87 92   < > 96 106*  --   --  110*  --   BUN 17 18   < > 17 14   < > 15 16 14   CREATININE 0.71 0.63   < > 0.66 0.72   < > 0.7 0.87 1.0  CALCIUM 8.6* 8.4*   < > 8.2* 8.7*   < > 8.8 9.2 9.0  MG  --  1.9  --   --   --   --   --   --   --   PHOS 2.9  --   --   --   --   --   --   --   --    < > =  values in this interval not displayed.   Recent Labs    05/25/24 0513 06/01/24 0121 07/14/24 0000 08/19/24 0914  AST 21 27 20 26   ALT 21 24 19 22   ALKPHOS 91 95 101 95  BILITOT 0.7 0.8  --  0.9  PROT 7.0 6.9  --  6.9  ALBUMIN 3.4* 2.7* 3.1* 3.5   Recent Labs    06/08/24 0453 06/09/24 0502 06/19/24 0000 07/14/24 0000 08/19/24 0913 09/04/24 0000  WBC 13.2* 13.2*   < > 7.7 8.8 10.6  NEUTROABS  --   --    < > 5,359.00 6.1 7,060.00  HGB 11.3* 11.1*   < > 10.4* 11.7* 11.1*  HCT 32.0* 32.1*   < > 32* 35.3* 34*  MCV 96.4 97.9  --   --  99.4  --   PLT 554* 616*   < > 415* 317 328   < > = values in this interval not  displayed.   Lab Results  Component Value Date   TSH 0.81 07/02/2024   No results found for: HGBA1C Lab Results  Component Value Date   CHOL 131 12/08/2014   HDL 47 12/08/2014   LDLCALC 65 12/08/2014   TRIG 93 12/08/2014    Assessment & Plan Essential hypertension Will add Norvasc  10 mg at bedtime.  Continue with propranolol  40 mg twice daily and lisinopril  20 mg daily.  His heart rate is too slow at times to increase his propranolol  anymore.  Concerned about increasing his ACE inhibitor given the somewhat poor appetite.  Discussed this with the wife.     Idiopathic thrombocytopenic purpura (HCC) Patient is had a history of ITP in the past.  He follows with hematology on a regular basis. Outpatient his CellCept  and Promacta  are currently on hold. Will stop his aspirin  for now.        Camellia Door, DO [facility] [phone]

## 2024-09-09 NOTE — Assessment & Plan Note (Addendum)
 Will add Norvasc  10 mg at bedtime.  Continue with propranolol  40 mg twice daily and lisinopril  20 mg daily.  His heart rate is too slow at times to increase his propranolol  anymore.  Concerned about increasing his ACE inhibitor given the somewhat poor appetite.  Discussed this with the wife.

## 2024-09-09 NOTE — Assessment & Plan Note (Addendum)
 Patient is had a history of ITP in the past.  He follows with hematology on a regular basis. Outpatient his CellCept  and Promacta  are currently on hold. Will stop his aspirin  for now.

## 2024-09-18 ENCOUNTER — Non-Acute Institutional Stay (SKILLED_NURSING_FACILITY): Payer: Self-pay | Admitting: Nurse Practitioner

## 2024-09-18 ENCOUNTER — Encounter: Payer: Self-pay | Admitting: Nurse Practitioner

## 2024-09-18 DIAGNOSIS — L03011 Cellulitis of right finger: Secondary | ICD-10-CM | POA: Diagnosis not present

## 2024-09-18 NOTE — Progress Notes (Signed)
 Location:  Other [facility].  Nursing Home Room Number: [facility] [id number] Place of Service:  SNF (31) [name], NP  PCP: [name], MD  Patient Care Team: [name], MD as PCP - General (Family Medicine) [name], MD as Consulting Physician (Oncology)  Extended Emergency Contact Information Primary Emergency Contact: Margerum,[name]  [place] Home Phone: [phone] Mobile Phone: [phone] Relation: Spouse Secondary Emergency Contact: [name]  [place] Home Phone: [phone] Relation: Son  Goals of care: Advanced Directive information    09/01/2024    9:55 AM  Advanced Directives  Does Patient Have a Medical Advance Directive? Yes  Type of Estate Agent of [place];Out of facility DNR (pink MOST or yellow form);Living will  Does patient want to make changes to medical advance directive? No - Patient declined  Copy of Healthcare Power of Attorney in Chart? Yes - validated most recent copy scanned in chart (See row information)  Would patient like information on creating a medical advance directive? No - Patient declined     Chief Complaint  Patient presents with   Swollen Finger    Swollen Right Pinky Finger.     HPI:  Pt is a 89 y.o. male seen today for an acute visit for Swollen Right Pinky Finger. He has had recurrent cellulitis of right pinky finger followed by hand specialist.  He had surgery in July 2025 for I&D. He has been on doxycycline for recurrent issue in the past.  Wife and staff notes swelling and tenderness has reoccurred. Pt denies fevers, chills, but reports pain and has more limited ROM.    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   History of ITP 03/22/2015   Hypercholesteremia    Hypertension    Insomnia    ITP  (idiopathic thrombocytopenic purpura) 09/01/2015   Leukocytosis    Lobar pneumonia 06/03/2024   Nasal bleeding 01/08/2016   Prostate cancer (HCC)    Shingles    Syncope 12/21/2014   Tenosynovitis of finger 05/25/2024   Tenosynovitis of finger and hand 05/24/2024   Thrombocytosis 06/09/2024   Past Surgical History:  Procedure Laterality Date   APPENDECTOMY     artificial left eye     EYE SURGERY     FLEXOR TENDON REPAIR Right 05/24/2024   Procedure: RIGHT FIFTH FINGER TENDON FLEXOR IRRIGATION AND DEBRIDEMENT;  Surgeon: Tobie Priest, MD;  Location: [facility] ORS;  Service: Orthopedics;  Laterality: Right;   HERNIA REPAIR     JOINT REPLACEMENT     PROSTATE SURGERY      Allergies  Allergen Reactions   Cephalexin  Diarrhea    C.diff   Hydrocodone-Acetaminophen  Nausea Only    Vomiting/dizziness    Outpatient Encounter Medications as of 09/18/2024  Medication Sig   acetaminophen  (TYLENOL ) 500 MG tablet Take 500 mg by mouth every 6 (six) hours as needed for mild pain. Reported on 01/24/2016   acetaminophen  (TYLENOL ) 500 MG tablet Take 1,000 mg by mouth at bedtime.   amLODipine  (NORVASC ) 10 MG tablet Take 10 mg by mouth daily.   cetaphil (CETAPHIL) lotion Apply 1 Application topically daily.   Cholecalciferol  (VITAMIN D3) 250 MCG (10000 UT) capsule Take 10,000 Units by mouth daily.   citalopram  (CELEXA ) 20 MG tablet Take 20 mg by mouth daily.   dorzolamide  (TRUSOPT ) 2 % ophthalmic solution Place 1 drop  into the right eye 2 (two) times daily.   doxycycline (ADOXA) 100 MG tablet Take 100 mg by mouth 2 (two) times daily.   latanoprost  (XALATAN ) 0.005 % ophthalmic solution Place 1 drop into the right eye at bedtime.   lisinopril  (ZESTRIL ) 20 MG tablet Take 20 mg by mouth daily.   melatonin 5 MG TABS Take 5 mg by mouth at bedtime.   omeprazole  (PRILOSEC) 40 MG capsule TAKE 1 CAPSULE BY MOUTH DAILY USUALLY 30 MINUTES BEFORE BREAKFAST   polyethylene glycol (MIRALAX  / GLYCOLAX ) 17 g packet Take 17  g by mouth every 12 (twelve) hours as needed.   pravastatin  (PRAVACHOL ) 20 MG tablet Take 20 mg by mouth at bedtime.    propranolol  (INDERAL ) 40 MG tablet Take 1 tablet by mouth 2 (two) times daily.   senna (SENOKOT) 8.6 MG TABS tablet Take 2 tablets by mouth daily.   aspirin  EC 81 MG tablet Take 1 tablet (81 mg total) by mouth daily. Swallow whole. (Patient not taking: Reported on 09/18/2024)   ciprofloxacin  (CIPRO ) 500 MG tablet Take 500 mg by mouth 2 (two) times daily. (Patient not taking: Reported on 09/18/2024)   [Paused] eltrombopag  (PROMACTA ) 25 MG tablet TAKE 1 TABLET (25 MG TOTAL) BY MOUTH EVERY OTHER DAY. TAKE ON AN EMPTY STOMACH, 1 HOUR BEFORE A MEAL OR 2 HOURS AFTER. (Patient not taking: Reported on 09/18/2024)   [Paused] mycophenolate  (CELLCEPT ) 500 MG tablet Take 1 tablet (500 mg total) by mouth daily. (Patient not taking: Reported on 09/18/2024)   No facility-administered encounter medications on file as of 09/18/2024.    Review of Systems  Unable to perform ROS: Dementia    Immunization History  Administered Date(s) Administered   INFLUENZA, HIGH DOSE SEASONAL PF 09/05/2021   Influenza Split 08/27/2014   Influenza Whole 07/21/2016   Influenza,inj,Quad PF,6+ Mos 09/01/2015   Influenza-Unspecified 09/03/2012, 08/20/2013, 08/27/2014, 09/12/2016, 08/27/2017, 08/10/2020, 08/31/2022, 09/02/2024   PFIZER(Purple Top)SARS-COV-2 Vaccination 11/24/2019, 12/15/2019   Pfizer(Comirnaty)Fall Seasonal Vaccine 12 years and older 08/29/2023   Pneumococcal Conjugate-13 10/02/2016   Td 09/09/2021   Tdap 05/14/2013   Zoster, Live 12/12/2012   Pertinent  Health Maintenance Due  Topic Date Due   Influenza Vaccine  Completed      09/08/2021    6:37 PM 12/23/2021    2:51 PM 05/09/2022    2:25 PM 08/29/2022    2:00 PM 09/07/2024    5:57 PM  Fall Risk  Falls in the past year?     1  Was there an injury with Fall?     0  Fall Risk Category Calculator     1  (RETIRED) Patient Fall Risk  Level Low fall risk  Low fall risk  High fall risk  Low fall risk    Patient at Risk for Falls Due to     Impaired balance/gait  Fall risk Follow up     Falls evaluation completed     Data saved with a previous flowsheet row definition   Functional Status Survey:    Vitals:   09/18/24 1518  BP: 116/78  Pulse: 71  Resp: 18  Temp: (!) 97.2 F (36.2 C)  SpO2: 96%  Weight: 169 lb 4.8 oz (76.8 kg)  Height: 5' 5 (1.651 m)   Body mass index is 28.17 kg/m. Physical Exam Constitutional:      Appearance: Normal appearance.  Musculoskeletal:        General: Swelling and tenderness present.  Neurological:     Mental Status: He  is alert. Mental status is at baseline.  Psychiatric:        Mood and Affect: Mood normal.     Labs reviewed: Recent Labs    06/05/24 0426 06/06/24 0426 06/07/24 0523 06/08/24 0453 06/09/24 0502 06/19/24 0000 07/14/24 0000 08/19/24 0914 09/04/24 0000  NA 133* 129*   < > 132* 132*   < > 136* 133* 134*  K 3.4* 4.1   < > 3.7 3.7   < > 4.2 3.8 4.1  CL 104 99   < > 102 98   < > 100 101 100  CO2 22 21*   < > 23 25   < > 28* 25 24*  GLUCOSE 87 92   < > 96 106*  --   --  110*  --   BUN 17 18   < > 17 14   < > 15 16 14   CREATININE 0.71 0.63   < > 0.66 0.72   < > 0.7 0.87 1.0  CALCIUM 8.6* 8.4*   < > 8.2* 8.7*   < > 8.8 9.2 9.0  MG  --  1.9  --   --   --   --   --   --   --   PHOS 2.9  --   --   --   --   --   --   --   --    < > = values in this interval not displayed.   Recent Labs    05/25/24 0513 06/01/24 0121 07/14/24 0000 08/19/24 0914  AST 21 27 20 26   ALT 21 24 19 22   ALKPHOS 91 95 101 95  BILITOT 0.7 0.8  --  0.9  PROT 7.0 6.9  --  6.9  ALBUMIN 3.4* 2.7* 3.1* 3.5   Recent Labs    06/08/24 0453 06/09/24 0502 06/19/24 0000 07/14/24 0000 08/19/24 0913 09/04/24 0000  WBC 13.2* 13.2*   < > 7.7 8.8 10.6  NEUTROABS  --   --    < > 5,359.00 6.1 7,060.00  HGB 11.3* 11.1*   < > 10.4* 11.7* 11.1*  HCT 32.0* 32.1*   < > 32* 35.3* 34*   MCV 96.4 97.9  --   --  99.4  --   PLT 554* 616*   < > 415* 317 328   < > = values in this interval not displayed.   Lab Results  Component Value Date   TSH 0.81 07/02/2024   No results found for: HGBA1C Lab Results  Component Value Date   CHOL 131 12/08/2014   HDL 47 12/08/2014   LDLCALC 65 12/08/2014   TRIG 93 12/08/2014    Significant Diagnostic Results in last 30 days:  No results found.  Assessment/Plan 1. Cellulitis of finger of right hand (Primary) Will start doxycycline 100 mg by mouth twice daily for 10 days Will have staff make follow up appt with hand specialist for further evaluation and treatment if needed Staff to monitor for worsening symptoms.     Kendale Rembold K. Caro BODILY [facility] [phone]

## 2024-10-01 ENCOUNTER — Encounter: Payer: Self-pay | Admitting: Orthopedic Surgery

## 2024-10-01 ENCOUNTER — Non-Acute Institutional Stay (SKILLED_NURSING_FACILITY): Admitting: Orthopedic Surgery

## 2024-10-01 DIAGNOSIS — R635 Abnormal weight gain: Secondary | ICD-10-CM

## 2024-10-01 DIAGNOSIS — I10 Essential (primary) hypertension: Secondary | ICD-10-CM | POA: Diagnosis not present

## 2024-10-01 DIAGNOSIS — D693 Immune thrombocytopenic purpura: Secondary | ICD-10-CM | POA: Diagnosis not present

## 2024-10-01 DIAGNOSIS — E7800 Pure hypercholesterolemia, unspecified: Secondary | ICD-10-CM

## 2024-10-01 DIAGNOSIS — F5101 Primary insomnia: Secondary | ICD-10-CM

## 2024-10-01 DIAGNOSIS — M65949 Unspecified synovitis and tenosynovitis, unspecified hand: Secondary | ICD-10-CM

## 2024-10-01 DIAGNOSIS — Z8546 Personal history of malignant neoplasm of prostate: Secondary | ICD-10-CM | POA: Diagnosis not present

## 2024-10-01 DIAGNOSIS — R4189 Other symptoms and signs involving cognitive functions and awareness: Secondary | ICD-10-CM | POA: Diagnosis not present

## 2024-10-01 DIAGNOSIS — K219 Gastro-esophageal reflux disease without esophagitis: Secondary | ICD-10-CM | POA: Diagnosis not present

## 2024-10-01 NOTE — Progress Notes (Signed)
 Location:  Other [facility] Room Number: [facility] [address]-A Place of Service:  SNF [phone]) Provider:  Greig Cluster, NP  PCP: [name], MD  Patient Care Team: [name], MD as PCP - General (Family Medicine) [name], MD as Consulting Physician (Oncology)  Extended Emergency Contact Information Primary Emergency Contact: Ivonne [name] [place] Home Phone: [phone] Mobile Phone: [phone] Relation: Spouse Secondary Emergency Contact: [name]  [place] Home Phone: [phone] Relation: Son  Code Status:  DNR Goals of care: Advanced Directive information    09/01/2024    9:55 AM  Advanced Directives  Does Patient Have a Medical Advance Directive? Yes  Type of Estate Agent of [place];Out of facility DNR (pink MOST or yellow form);Living will  Does patient want to make changes to medical advance directive? No - Patient declined  Copy of Healthcare Power of Attorney in Chart? Yes - validated most recent copy scanned in chart (See row information)  Would patient like information on creating a medical advance directive? No - Patient declined     Chief Complaint  Patient presents with   Medical Management of Chronic Issues    Medical Management of Chronic Issues    HPI:  Pt is a 89 y.o. male seen today for medical management of chronic diseases.    He currently resides on the skilled nursing unit at [facility] PMH: HTN, HLD, CAP, right retinal detachment, ARF, prostate cancer s/p TURP 2017> pathology negative for adenocarcinoma> active surveillance, ITP and unstable gait.    ITP- followed by oncology> f/u in 4 months, monitoring  H/o prostate cancer- followed by [facility], s/p TURP 2017, remains under surveillance, Promacta  and Cellcept  on hold HTN- BUN/creat 14/1.0 09/04/2024, remains on lisinopril  and propranolol  HLD- total 134, LDL 60, remains on atorvastatin Cognitive  impairment- no recent MMSE, 02/2024 CT head noted stable mild diffuse atrophy and moderate chronic small vessel ischemic changes, recent BIMS 14/15 (10/27), confusion improved after UTI treatment completed, no agitation, not on medication GERD- hgb 11.1 09/04/2024, remains on omeprazole  Insomnia- remains on melatonin Tenosynovitis of finger- I/D 05/2024, cultures were negative, 09/16 developed cellulitis> completed doxycycline, followed by hand specialist, remains on tylenol  for pain  No recent falls or injuries. Ambulates with walker.   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   History of ITP 03/22/2015   Hypercholesteremia    Hypertension    Insomnia    ITP (idiopathic thrombocytopenic purpura) 09/01/2015   Leukocytosis    Lobar pneumonia 06/03/2024   Nasal bleeding 01/08/2016   Prostate cancer (HCC)    Shingles    Syncope 12/21/2014   Tenosynovitis of finger 05/25/2024   Tenosynovitis of finger and hand 05/24/2024   Thrombocytosis 06/09/2024   Past Surgical History:  Procedure Laterality Date   APPENDECTOMY     artificial left eye     EYE SURGERY     FLEXOR TENDON REPAIR Right 05/24/2024   Procedure: RIGHT FIFTH FINGER TENDON FLEXOR IRRIGATION AND DEBRIDEMENT;  Surgeon: Tobie Priest, MD;  Location: [facility] ORS;  Service: Orthopedics;  Laterality: Right;   HERNIA REPAIR     JOINT REPLACEMENT     PROSTATE  SURGERY      Allergies  Allergen Reactions   Cephalexin  Diarrhea    C.diff   Hydrocodone-Acetaminophen  Nausea Only    Vomiting/dizziness    Outpatient Encounter Medications as of 10/01/2024  Medication Sig   acetaminophen  (TYLENOL ) 500 MG tablet Take 500 mg by mouth every 6 (six)  hours as needed for mild pain. Reported on 01/24/2016   acetaminophen  (TYLENOL ) 500 MG tablet Take 1,000 mg by mouth at bedtime.   amLODipine  (NORVASC ) 10 MG tablet Take 10 mg by mouth daily.   aspirin  EC 81 MG tablet Take 1 tablet (81 mg total) by mouth daily. Swallow whole.   cetaphil (CETAPHIL) lotion Apply 1 Application topically daily.   Cholecalciferol  (VITAMIN D3) 250 MCG (10000 UT) capsule Take 10,000 Units by mouth daily.   dorzolamide  (TRUSOPT ) 2 % ophthalmic solution Place 1 drop into the right eye 2 (two) times daily.   latanoprost  (XALATAN ) 0.005 % ophthalmic solution Place 1 drop into the right eye at bedtime.   lisinopril  (ZESTRIL ) 20 MG tablet Take 20 mg by mouth daily.   melatonin 5 MG TABS Take 5 mg by mouth at bedtime.   omeprazole  (PRILOSEC) 40 MG capsule TAKE 1 CAPSULE BY MOUTH DAILY USUALLY 30 MINUTES BEFORE BREAKFAST   polyethylene glycol (MIRALAX  / GLYCOLAX ) 17 g packet Take 17 g by mouth every 12 (twelve) hours as needed.   pravastatin  (PRAVACHOL ) 20 MG tablet Take 20 mg by mouth at bedtime.    propranolol  (INDERAL ) 40 MG tablet Take 1 tablet by mouth 2 (two) times daily.   senna (SENOKOT) 8.6 MG TABS tablet Take 2 tablets by mouth daily.   ciprofloxacin  (CIPRO ) 500 MG tablet Take 500 mg by mouth 2 (two) times daily. (Patient not taking: Reported on 10/01/2024)   citalopram  (CELEXA ) 20 MG tablet Take 20 mg by mouth daily. (Patient not taking: Reported on 10/01/2024)   doxycycline (ADOXA) 100 MG tablet Take 100 mg by mouth 2 (two) times daily. (Patient not taking: Reported on 10/01/2024)   [Paused] eltrombopag  (PROMACTA ) 25 MG tablet TAKE 1 TABLET (25 MG TOTAL) BY MOUTH EVERY OTHER DAY. TAKE ON AN EMPTY STOMACH, 1 HOUR BEFORE A MEAL OR 2 HOURS AFTER. (Patient not taking: Reported on 10/01/2024)   [Paused] mycophenolate  (CELLCEPT ) 500 MG tablet Take 1 tablet (500 mg total) by mouth daily. (Patient not taking: Reported on 10/01/2024)   No facility-administered encounter  medications on file as of 10/01/2024.    Review of Systems  Constitutional: Negative.   HENT: Negative.    Respiratory: Negative.    Cardiovascular: Negative.   Gastrointestinal: Negative.   Genitourinary: Negative.   Musculoskeletal:  Positive for arthralgias and gait problem.  Skin:  Positive for wound.  Neurological:  Positive for weakness. Negative for dizziness and light-headedness.  Psychiatric/Behavioral:  Positive for confusion. Negative for dysphoric mood. The patient is not nervous/anxious.     Immunization History  Administered Date(s) Administered   INFLUENZA, HIGH DOSE SEASONAL PF 09/05/2021   Influenza Split 08/27/2014   Influenza Whole 07/21/2016   Influenza,inj,Quad PF,6+ Mos 09/01/2015   Influenza-Unspecified 09/03/2012, 08/20/2013, 08/27/2014, 09/12/2016, 08/27/2017, 08/10/2020, 08/31/2022, 09/02/2024   PFIZER(Purple Top)SARS-COV-2 Vaccination 11/24/2019, 12/15/2019   Pfizer(Comirnaty)Fall Seasonal Vaccine 12 years and older 08/29/2023   Pneumococcal Conjugate-13 10/02/2016   Td 09/09/2021   Tdap 05/14/2013   Zoster, Live 12/12/2012   Pertinent  Health Maintenance Due  Topic Date Due   Influenza Vaccine  Completed      09/08/2021    6:37 PM 12/23/2021  2:51 PM 05/09/2022    2:25 PM 08/29/2022    2:00 PM 09/07/2024    5:57 PM  Fall Risk  Falls in the past year?     1  Was there an injury with Fall?     0  Fall Risk Category Calculator     1  (RETIRED) Patient Fall Risk Level Low fall risk  Low fall risk  High fall risk  Low fall risk    Patient at Risk for Falls Due to     Impaired balance/gait  Fall risk Follow up     Falls evaluation completed     Data saved with a previous flowsheet row definition   Functional Status Survey:    Vitals:   10/01/24 1029  BP: 137/70  Pulse: 61  Resp: 17  Temp: (!) 97.1 F (36.2 C)  SpO2: 97%  Weight: 171 lb 3.2 oz (77.7 kg)  Height: 5' 5 (1.651 m)   Body mass index is 28.49 kg/m. Physical  Exam Vitals reviewed.  Constitutional:      General: He is not in acute distress. HENT:     Head: Normocephalic.     Right Ear: There is no impacted cerumen.     Left Ear: There is no impacted cerumen.     Nose: Nose normal.     Mouth/Throat:     Mouth: Mucous membranes are moist.  Eyes:     General:        Right eye: No discharge.        Left eye: No discharge.  Cardiovascular:     Rate and Rhythm: Normal rate and regular rhythm.     Pulses: Normal pulses.     Heart sounds: Murmur heard.  Pulmonary:     Effort: Pulmonary effort is normal.     Breath sounds: Normal breath sounds.  Abdominal:     General: Bowel sounds are normal. There is no distension.     Palpations: Abdomen is soft.     Tenderness: There is no abdominal tenderness.  Musculoskeletal:     Cervical back: Neck supple.     Right lower leg: No edema.     Left lower leg: No edema.  Skin:    General: Skin is warm.     Capillary Refill: Capillary refill takes less than 2 seconds.  Neurological:     General: No focal deficit present.     Mental Status: He is alert. Mental status is at baseline.     Gait: Gait abnormal.  Psychiatric:        Mood and Affect: Mood normal.     Labs reviewed: Recent Labs    06/05/24 0426 06/06/24 0426 06/07/24 0523 06/08/24 0453 06/09/24 0502 06/19/24 0000 07/14/24 0000 08/19/24 0914 09/04/24 0000  NA 133* 129*   < > 132* 132*   < > 136* 133* 134*  K 3.4* 4.1   < > 3.7 3.7   < > 4.2 3.8 4.1  CL 104 99   < > 102 98   < > 100 101 100  CO2 22 21*   < > 23 25   < > 28* 25 24*  GLUCOSE 87 92   < > 96 106*  --   --  110*  --   BUN 17 18   < > 17 14   < > 15 16 14   CREATININE 0.71 0.63   < > 0.66 0.72   < > 0.7 0.87 1.0  CALCIUM  8.6* 8.4*   < > 8.2* 8.7*   < > 8.8 9.2 9.0  MG  --  1.9  --   --   --   --   --   --   --   PHOS 2.9  --   --   --   --   --   --   --   --    < > = values in this interval not displayed.   Recent Labs    05/25/24 0513 06/01/24 0121  07/14/24 0000 08/19/24 0914  AST 21 27 20 26   ALT 21 24 19 22   ALKPHOS 91 95 101 95  BILITOT 0.7 0.8  --  0.9  PROT 7.0 6.9  --  6.9  ALBUMIN 3.4* 2.7* 3.1* 3.5   Recent Labs    06/08/24 0453 06/09/24 0502 06/19/24 0000 07/14/24 0000 08/19/24 0913 09/04/24 0000  WBC 13.2* 13.2*   < > 7.7 8.8 10.6  NEUTROABS  --   --    < > 5,359.00 6.1 7,060.00  HGB 11.3* 11.1*   < > 10.4* 11.7* 11.1*  HCT 32.0* 32.1*   < > 32* 35.3* 34*  MCV 96.4 97.9  --   --  99.4  --   PLT 554* 616*   < > 415* 317 328   < > = values in this interval not displayed.   Lab Results  Component Value Date   TSH 0.81 07/02/2024   No results found for: HGBA1C Lab Results  Component Value Date   CHOL 131 12/08/2014   HDL 47 12/08/2014   LDLCALC 65 12/08/2014   TRIG 93 12/08/2014    Significant Diagnostic Results in last 30 days:  No results found.  Assessment/Plan 1. Weight gain (Primary) - weight increased by 11 lbs since 07/2024 - TS 0.81 06/2024 - cont monthly weights  2. Chronic ITP (idiopathic thrombocytopenia) (HCC) - followed by oncology   3. History of prostate cancer - followed by [facility] - s/p TURP 2017> pathology negative for adenocarcinoma - Promacta  and Cellcept  on hold  - remains on active surveillance   4. Essential hypertension - controlled with lisinopril  and propranolol   5. Pure hypercholesterolemia - cont pravastatin   6. Cognitive impairment - recent confusion with UTI> improved - BIMS was 14/15 (10/27) - dependent with ADLs except feeding - ambulates with walker - not on medication  - cont skilled nursing   7. Gastroesophageal reflux disease without esophagitis - cont omeprazole   8. Primary insomnia - cont melatonin  9. Tenosynovitis of finger - completed doxycycline - followed by han specialist    Family/ staff Communication: plan discussed with patient and nurse  Labs/tests ordered:  none

## 2024-10-29 ENCOUNTER — Encounter: Payer: Self-pay | Admitting: Orthopedic Surgery

## 2024-10-29 ENCOUNTER — Non-Acute Institutional Stay (SKILLED_NURSING_FACILITY): Admitting: Orthopedic Surgery

## 2024-10-29 DIAGNOSIS — R062 Wheezing: Secondary | ICD-10-CM | POA: Diagnosis not present

## 2024-10-29 DIAGNOSIS — R4189 Other symptoms and signs involving cognitive functions and awareness: Secondary | ICD-10-CM

## 2024-10-29 NOTE — Progress Notes (Signed)
 Location:  Other [facility] Room Number: [facility] 112A Place of Service:  SNF [phone]) Provider:  Greig Cluster, NP  PCP: [name], MD  Patient Care Team: [name], MD as PCP - General (Family Medicine) [name], MD as Consulting Physician (Oncology)  Extended Emergency Contact Information Primary Emergency Contact: Cavalieri,[name]  [place] Home Phone: [phone] Mobile Phone: [phone] Relation: Spouse Secondary Emergency Contact: [name]  [place] Home Phone: [phone] Relation: Son  Code Status:  DNR Goals of care: Advanced Directive information    10/01/2024   10:57 AM  Advanced Directives  Does Patient Have a Medical Advance Directive? Yes  Type of Estate Agent of [place];Out of facility DNR (pink MOST or yellow form)  Does patient want to make changes to medical advance directive? No - Patient declined  Copy of Healthcare Power of Attorney in Chart? Yes - validated most recent copy scanned in chart (See row information)     Chief Complaint  Patient presents with   Wheezing    Wheezing    HPI:  Pt is a 89 y.o. male seen today for acute visit due to acute cough and wheezing.   He currently resides on the skilled nursing unit at [facility]. PMH: HTN, HLD, CAP, right retinal detachment, ARF, prostate cancer s/p TURP 2017> pathology negative for adenocarcinoma> active surveillance, ITP and unstable gait.   Nursing reports increased cough x 1 day. Rapid flu/covid negative. He denies chest pain or shortness of breath. Afebrile. Vitals stable.   No behaviors. Very pleasant. Recent BIMS 14/15 (10/27). Nor on medication.   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   History of ITP 03/22/2015   Hypercholesteremia    Hypertension     Insomnia    ITP (idiopathic thrombocytopenic purpura) 09/01/2015   Leukocytosis    Lobar pneumonia 06/03/2024   Nasal bleeding 01/08/2016   Prostate cancer (HCC)    Shingles    Syncope 12/21/2014   Tenosynovitis of finger 05/25/2024   Tenosynovitis of finger and hand 05/24/2024   Thrombocytosis 06/09/2024   Past Surgical History:  Procedure Laterality Date   APPENDECTOMY     artificial left eye     EYE SURGERY     FLEXOR TENDON REPAIR Right 05/24/2024   Procedure: RIGHT FIFTH FINGER TENDON FLEXOR IRRIGATION AND DEBRIDEMENT;  Surgeon: Tobie Priest, MD;  Location: [facility] ORS;  Service: Orthopedics;  Laterality: Right;   HERNIA REPAIR     JOINT REPLACEMENT     PROSTATE SURGERY      Allergies  Allergen Reactions   Cephalexin  Diarrhea    C.diff   Hydrocodone-Acetaminophen  Nausea Only    Vomiting/dizziness    Outpatient Encounter Medications as of 10/29/2024  Medication Sig   acetaminophen  (TYLENOL ) 500 MG tablet Take 500 mg by mouth every 6 (six) hours as needed for mild pain. Reported on 01/24/2016   acetaminophen  (TYLENOL ) 500 MG tablet Take 1,000 mg by mouth at bedtime.   amLODipine  (NORVASC ) 10 MG tablet Take 10 mg by mouth daily.   cetaphil (CETAPHIL) lotion Apply 1 Application topically daily.   Cholecalciferol  (VITAMIN D3) 250 MCG (10000 UT) capsule Take 10,000 Units by mouth daily.   citalopram  (CELEXA ) 20 MG tablet Take 20 mg by mouth daily.   dorzolamide  (TRUSOPT ) 2 % ophthalmic solution Place 1 drop  into the right eye 2 (two) times daily.   guaiFENesin  (ROBITUSSIN) 100 MG/5ML liquid Take 5 mLs by mouth every 4 (four) hours as needed for cough or to loosen phlegm.   ipratropium-albuterol  (DUONEB) 0.5-2.5 (3) MG/3ML SOLN Take 3 mLs by nebulization daily.   latanoprost  (XALATAN ) 0.005 % ophthalmic solution Place 1 drop into the right eye at bedtime.   lisinopril  (ZESTRIL ) 20 MG tablet Take 20 mg by mouth daily.   melatonin 5 MG TABS Take 5 mg by mouth at bedtime.    omeprazole  (PRILOSEC) 40 MG capsule TAKE 1 CAPSULE BY MOUTH DAILY USUALLY 30 MINUTES BEFORE BREAKFAST   polyethylene glycol (MIRALAX  / GLYCOLAX ) 17 g packet Take 17 g by mouth every 12 (twelve) hours as needed.   pravastatin  (PRAVACHOL ) 20 MG tablet Take 20 mg by mouth at bedtime.    propranolol  (INDERAL ) 40 MG tablet Take 1 tablet by mouth 2 (two) times daily.   senna (SENOKOT) 8.6 MG TABS tablet Take 2 tablets by mouth daily.   aspirin  EC 81 MG tablet Take 1 tablet (81 mg total) by mouth daily. Swallow whole. (Patient not taking: Reported on 10/29/2024)   [Paused] eltrombopag  (PROMACTA ) 25 MG tablet TAKE 1 TABLET (25 MG TOTAL) BY MOUTH EVERY OTHER DAY. TAKE ON AN EMPTY STOMACH, 1 HOUR BEFORE A MEAL OR 2 HOURS AFTER. (Patient not taking: Reported on 10/29/2024)   [Paused] mycophenolate  (CELLCEPT ) 500 MG tablet Take 1 tablet (500 mg total) by mouth daily. (Patient not taking: Reported on 10/29/2024)   No facility-administered encounter medications on file as of 10/29/2024.    Review of Systems  Constitutional:  Negative for fatigue and fever.  HENT:  Negative for congestion, ear pain and sore throat.   Respiratory:  Positive for cough and wheezing. Negative for shortness of breath.   Cardiovascular:  Negative for chest pain and leg swelling.  Gastrointestinal:  Negative for nausea and vomiting.  Genitourinary:  Negative for dysuria and hematuria.  Musculoskeletal:  Negative for myalgias.  Neurological:  Negative for dizziness and headaches.  Psychiatric/Behavioral:  Positive for confusion. Negative for dysphoric mood. The patient is not nervous/anxious.     Immunization History  Administered Date(s) Administered   INFLUENZA, HIGH DOSE SEASONAL PF 09/05/2021   Influenza Split 08/27/2014   Influenza Whole 07/21/2016   Influenza,inj,Quad PF,6+ Mos 09/01/2015   Influenza-Unspecified 09/03/2012, 08/20/2013, 08/27/2014, 09/12/2016, 08/27/2017, 08/10/2020, 08/31/2022, 09/02/2024    PFIZER(Purple Top)SARS-COV-2 Vaccination 11/24/2019, 12/15/2019   Pfizer(Comirnaty)Fall Seasonal Vaccine 12 years and older 08/29/2023   Pneumococcal Conjugate-13 10/02/2016   Td 09/09/2021   Tdap 05/14/2013   Zoster, Live 12/12/2012   Pertinent  Health Maintenance Due  Topic Date Due   Influenza Vaccine  Completed      12/23/2021    2:51 PM 05/09/2022    2:25 PM 08/29/2022    2:00 PM 09/07/2024    5:57 PM 10/01/2024    5:28 PM  Fall Risk  Falls in the past year?    1 1  Was there an injury with Fall?    0  0   Fall Risk Category Calculator    1 1  (RETIRED) Patient Fall Risk Level Low fall risk  High fall risk  Low fall risk     Patient at Risk for Falls Due to    Impaired balance/gait Impaired balance/gait  Fall risk Follow up    Falls evaluation completed Falls evaluation completed     Data saved with a previous flowsheet row definition  Functional Status Survey:    Vitals:   10/29/24 1257  BP: (!) 109/54  Pulse: 74  Resp: 18  Temp: 98 F (36.7 C)  SpO2: 98%  Weight: 181 lb 9.6 oz (82.4 kg)  Height: 5' 5 (1.651 m)   Body mass index is 30.22 kg/m. Physical Exam Vitals reviewed.  Constitutional:      General: He is not in acute distress. HENT:     Head: Normocephalic.     Right Ear: Tympanic membrane normal.     Left Ear: Tympanic membrane normal.     Nose: Nose normal.     Mouth/Throat:     Mouth: Mucous membranes are moist.     Pharynx: No posterior oropharyngeal erythema.  Eyes:     General:        Right eye: No discharge.        Left eye: No discharge.  Cardiovascular:     Rate and Rhythm: Normal rate and regular rhythm.     Pulses: Normal pulses.     Heart sounds: Normal heart sounds.  Pulmonary:     Effort: Pulmonary effort is normal.     Breath sounds: Wheezing present.     Comments: expiratory Abdominal:     General: Bowel sounds are normal.     Palpations: Abdomen is soft.  Musculoskeletal:     Right lower leg: No edema.     Left lower  leg: No edema.  Lymphadenopathy:     Cervical: No cervical adenopathy.  Skin:    General: Skin is warm.     Capillary Refill: Capillary refill takes less than 2 seconds.  Neurological:     General: No focal deficit present.     Mental Status: He is alert and oriented to person, place, and time.     Gait: Gait abnormal.  Psychiatric:        Mood and Affect: Mood normal.     Labs reviewed: Recent Labs    06/05/24 0426 06/06/24 0426 06/07/24 0523 06/08/24 0453 06/09/24 0502 06/19/24 0000 07/14/24 0000 08/19/24 0914 09/04/24 0000  NA 133* 129*   < > 132* 132*   < > 136* 133* 134*  K 3.4* 4.1   < > 3.7 3.7   < > 4.2 3.8 4.1  CL 104 99   < > 102 98   < > 100 101 100  CO2 22 21*   < > 23 25   < > 28* 25 24*  GLUCOSE 87 92   < > 96 106*  --   --  110*  --   BUN 17 18   < > 17 14   < > 15 16 14   CREATININE 0.71 0.63   < > 0.66 0.72   < > 0.7 0.87 1.0  CALCIUM 8.6* 8.4*   < > 8.2* 8.7*   < > 8.8 9.2 9.0  MG  --  1.9  --   --   --   --   --   --   --   PHOS 2.9  --   --   --   --   --   --   --   --    < > = values in this interval not displayed.   Recent Labs    05/25/24 0513 06/01/24 0121 07/14/24 0000 08/19/24 0914  AST 21 27 20 26   ALT 21 24 19 22   ALKPHOS 91 95 101 95  BILITOT 0.7 0.8  --  0.9  PROT 7.0 6.9  --  6.9  ALBUMIN 3.4* 2.7* 3.1* 3.5   Recent Labs    06/08/24 0453 06/09/24 0502 06/19/24 0000 07/14/24 0000 08/19/24 0913 09/04/24 0000  WBC 13.2* 13.2*   < > 7.7 8.8 10.6  NEUTROABS  --   --    < > 5,359.00 6.1 7,060.00  HGB 11.3* 11.1*   < > 10.4* 11.7* 11.1*  HCT 32.0* 32.1*   < > 32* 35.3* 34*  MCV 96.4 97.9  --   --  99.4  --   PLT 554* 616*   < > 415* 317 328   < > = values in this interval not displayed.   Lab Results  Component Value Date   TSH 0.81 07/02/2024   No results found for: HGBA1C Lab Results  Component Value Date   CHOL 131 12/08/2014   HDL 47 12/08/2014   LDLCALC 65 12/08/2014   TRIG 93 12/08/2014    Significant  Diagnostic Results in last 30 days:  No results found.  Assessment/Plan 1. Wheezing (Primary) - onset x 1 day - rapid flu/covid negative - exp wheezing to upper lobes - start duonebs  2. Cognitive impairment - no behaviors - dependent with ADLs except feeding - not on medication  - cont skilled nursing     Family/ staff Communication: plan discussed with patient and nurse  Labs/tests ordered:  none

## 2024-11-11 ENCOUNTER — Non-Acute Institutional Stay (SKILLED_NURSING_FACILITY): Payer: Self-pay | Admitting: Internal Medicine

## 2024-11-11 ENCOUNTER — Encounter: Payer: Self-pay | Admitting: Internal Medicine

## 2024-11-11 DIAGNOSIS — I10 Essential (primary) hypertension: Secondary | ICD-10-CM

## 2024-11-11 DIAGNOSIS — E7800 Pure hypercholesterolemia, unspecified: Secondary | ICD-10-CM

## 2024-11-11 DIAGNOSIS — D693 Immune thrombocytopenic purpura: Secondary | ICD-10-CM | POA: Diagnosis not present

## 2024-11-11 DIAGNOSIS — Z970 Presence of artificial eye: Secondary | ICD-10-CM | POA: Diagnosis not present

## 2024-11-11 DIAGNOSIS — C61 Malignant neoplasm of prostate: Secondary | ICD-10-CM | POA: Diagnosis not present

## 2024-11-11 NOTE — Progress Notes (Signed)
 [facility] Routine Visit Progress Note    Location:  Other [facility]  Nursing Home Room Number: [facility] [id number] Place of Service:  SNF [id number]) Camellia Door, DO   PCP: [name], MD   Patient Care Team: [name], MD as PCP - General (Family Medicine) [name], MD as Consulting Physician (Oncology)   Extended Emergency Contact Information Primary Emergency Contact: Wilcock,[name]  [place] Home Phone: [phone] Mobile Phone: [phone] Relation: Spouse Secondary Emergency Contact: Taye, Cato  [place] Home Phone: [phone] Relation: Son   Goals of care: Advanced Directive information    10/01/2024   10:57 AM  Advanced Directives  Does Patient Have a Medical Advance Directive? Yes  Type of Estate Agent of [place];Out of facility DNR (pink MOST or yellow form)  Does patient want to make changes to medical advance directive? No - Patient declined  Copy of Healthcare Power of Attorney in Chart? Yes - validated most recent copy scanned in chart (See row information)    CODE STATUS: Do Not Resuscitate (DNR)   Chief Complaint  Patient presents with   Medical Management of Chronic Issues    Medical Management of Chronic Issues.      HPI: Pt is a 89 y.o. male seen today for medical management of chronic disease.    89 year old male with a history of ITP, hypertension, hyperlipidemia, glaucoma recently had his 89th birthday on October 22, 2022.  He states that he still having some knee pain in his right knee.  He is on scheduled Tylenol  at bedtime.  He has no specific health concerns other than his right knee pain.  He states that he was working with physical therapy and may have strained his left groin muscle.  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   History of ITP 03/22/2015   History of right knee joint replacement 06/11/2024   Hypercholesteremia    Hypertension    Insomnia    ITP (idiopathic thrombocytopenic purpura) 09/01/2015   Leukocytosis    Lobar pneumonia 06/03/2024   Lower urinary tract symptoms (LUTS) 10/18/2016   Nasal bleeding 01/08/2016   Prostate cancer (HCC)    Shingles    Syncope 12/21/2014   Tenosynovitis of finger 05/25/2024   Tenosynovitis of finger and hand 05/24/2024   Thrombocytosis 06/09/2024   Past Surgical History:  Procedure Laterality Date   APPENDECTOMY     artificial left eye     EYE SURGERY     FLEXOR TENDON REPAIR Right 05/24/2024   Procedure: RIGHT FIFTH FINGER TENDON FLEXOR IRRIGATION AND DEBRIDEMENT;  Surgeon: Tobie Priest, MD;  Location: [facility] ORS;  Service: Orthopedics;  Laterality: Right;   HERNIA REPAIR     JOINT REPLACEMENT     PROSTATE SURGERY       Allergies[1]   Outpatient Encounter Medications as of 11/11/2024  Medication Sig   acetaminophen  (TYLENOL ) 500 MG tablet Take 500 mg by mouth every 6 (six) hours as needed for mild pain. Reported on 01/24/2016   acetaminophen  (TYLENOL ) 500 MG tablet Take 1,000 mg by mouth at bedtime.   amLODipine  (NORVASC ) 10 MG tablet Take 10 mg by mouth daily.   cetaphil (CETAPHIL) lotion Apply 1 Application topically daily.   Cholecalciferol  (VITAMIN D3) 250  MCG (10000 UT) capsule Take 10,000 Units by mouth daily.   citalopram  (CELEXA ) 20 MG tablet Take 20 mg by mouth daily.   dorzolamide  (TRUSOPT ) 2 % ophthalmic solution Place 1 drop into the right eye 2 (two) times daily.   guaiFENesin  (ROBITUSSIN) 100 MG/5ML liquid Take 5 mLs by mouth every 4 (four) hours as needed for cough or to loosen phlegm.   latanoprost  (XALATAN ) 0.005 % ophthalmic solution Place 1 drop into the right eye at bedtime.   lisinopril  (ZESTRIL ) 20 MG tablet Take 20 mg by mouth daily.   melatonin 5 MG TABS  Take 5 mg by mouth at bedtime.   omeprazole  (PRILOSEC) 40 MG capsule TAKE 1 CAPSULE BY MOUTH DAILY USUALLY 30 MINUTES BEFORE BREAKFAST   polyethylene glycol (MIRALAX  / GLYCOLAX ) 17 g packet Take 17 g by mouth every 12 (twelve) hours as needed.   pravastatin  (PRAVACHOL ) 20 MG tablet Take 20 mg by mouth at bedtime.    propranolol  (INDERAL ) 40 MG tablet Take 1 tablet by mouth 2 (two) times daily.   senna (SENOKOT) 8.6 MG TABS tablet Take 2 tablets by mouth daily.   [Paused] eltrombopag  (PROMACTA ) 25 MG tablet TAKE 1 TABLET (25 MG TOTAL) BY MOUTH EVERY OTHER DAY. TAKE ON AN EMPTY STOMACH, 1 HOUR BEFORE A MEAL OR 2 HOURS AFTER. (Patient not taking: Reported on 11/11/2024)   ipratropium-albuterol  (DUONEB) 0.5-2.5 (3) MG/3ML SOLN Take 3 mLs by nebulization daily. (Patient not taking: Reported on 11/11/2024)   [Paused] mycophenolate  (CELLCEPT ) 500 MG tablet Take 1 tablet (500 mg total) by mouth daily. (Patient not taking: Reported on 11/11/2024)   No facility-administered encounter medications on file as of 11/11/2024.     Review of Systems  Constitutional: Negative.   HENT: Negative.    Eyes: Negative.   Respiratory: Negative.    Cardiovascular: Negative.   Gastrointestinal: Negative.   Endocrine: Negative.   Genitourinary: Negative.   Musculoskeletal:        Left groin muscle strain, chronic right knee pain.  Allergic/Immunologic: Negative.   Neurological: Negative.   Hematological: Negative.   Psychiatric/Behavioral: Negative.    All other systems reviewed and are negative.    Immunization History  Administered Date(s) Administered   INFLUENZA, HIGH DOSE SEASONAL PF 09/05/2021   Influenza Split 08/27/2014   Influenza Whole 07/21/2016   Influenza,inj,Quad PF,6+ Mos 09/01/2015   Influenza-Unspecified 09/03/2012, 08/20/2013, 08/27/2014, 09/12/2016, 08/27/2017, 08/10/2020, 08/31/2022, 09/02/2024   PFIZER(Purple Top)SARS-COV-2 Vaccination 11/24/2019, 12/15/2019   Pfizer(Comirnaty)Fall  Seasonal Vaccine 12 years and older 08/29/2023   Pneumococcal Conjugate-13 10/02/2016   Td 09/09/2021   Tdap 05/14/2013   Zoster, Live 12/12/2012   Pertinent  Health Maintenance Due  Topic Date Due   Influenza Vaccine  Completed      12/23/2021    2:51 PM 05/09/2022    2:25 PM 08/29/2022    2:00 PM 09/07/2024    5:57 PM 10/01/2024    5:28 PM  Fall Risk  Falls in the past year?    1 1  Was there an injury with Fall?    0  0   Fall Risk Category Calculator    1 1  (RETIRED) Patient Fall Risk Level Low fall risk  High fall risk  Low fall risk     Patient at Risk for Falls Due to    Impaired balance/gait Impaired balance/gait  Fall risk Follow up    Falls evaluation completed Falls evaluation completed     Data saved with a previous flowsheet row  definition   Functional Status Survey:     Vitals:   11/11/24 0953  BP: 125/72  Pulse: 63  Resp: 18  Temp: 97.6 F (36.4 C)  SpO2: 95%  Weight: 184 lb 6.4 oz (83.6 kg)  Height: 5' 5 (1.651 m)   Body mass index is 30.69 kg/m. Physical Exam Vitals and nursing note reviewed.  Constitutional:      General: He is not in acute distress.    Appearance: He is normal weight. He is not toxic-appearing.  HENT:     Head: Normocephalic and atraumatic.     Nose: Nose normal.  Eyes:     Comments: Left eye globe prosthesis  Cardiovascular:     Rate and Rhythm: Normal rate and regular rhythm.  Pulmonary:     Effort: Pulmonary effort is normal.     Breath sounds: Normal breath sounds.  Abdominal:     General: Bowel sounds are normal. There is no distension.     Palpations: Abdomen is soft.  Neurological:     Mental Status: He is alert.      Labs reviewed: Recent Labs    06/05/24 0426 06/06/24 0426 06/07/24 0523 06/08/24 0453 06/09/24 0502 06/19/24 0000 07/14/24 0000 08/19/24 0914 09/04/24 0000  NA 133* 129*   < > 132* 132*   < > 136* 133* 134*  K 3.4* 4.1   < > 3.7 3.7   < > 4.2 3.8 4.1  CL 104 99   < > 102 98   < >  100 101 100  CO2 22 21*   < > 23 25   < > 28* 25 24*  GLUCOSE 87 92   < > 96 106*  --   --  110*  --   BUN 17 18   < > 17 14   < > 15 16 14   CREATININE 0.71 0.63   < > 0.66 0.72   < > 0.7 0.87 1.0  CALCIUM 8.6* 8.4*   < > 8.2* 8.7*   < > 8.8 9.2 9.0  MG  --  1.9  --   --   --   --   --   --   --   PHOS 2.9  --   --   --   --   --   --   --   --    < > = values in this interval not displayed.   Recent Labs    05/25/24 0513 06/01/24 0121 07/14/24 0000 08/19/24 0914  AST 21 27 20 26   ALT 21 24 19 22   ALKPHOS 91 95 101 95  BILITOT 0.7 0.8  --  0.9  PROT 7.0 6.9  --  6.9  ALBUMIN 3.4* 2.7* 3.1* 3.5   Recent Labs    06/08/24 0453 06/09/24 0502 06/19/24 0000 07/14/24 0000 08/19/24 0913 09/04/24 0000  WBC 13.2* 13.2*   < > 7.7 8.8 10.6  NEUTROABS  --   --    < > 5,359.00 6.1 7,060.00  HGB 11.3* 11.1*   < > 10.4* 11.7* 11.1*  HCT 32.0* 32.1*   < > 32* 35.3* 34*  MCV 96.4 97.9  --   --  99.4  --   PLT 554* 616*   < > 415* 317 328   < > = values in this interval not displayed.   Lab Results  Component Value Date   TSH 0.81 07/02/2024   No results found for: HGBA1C Lab Results  Component Value Date   CHOL 131 12/08/2014   HDL 47 12/08/2014   LDLCALC 65 12/08/2014   TRIG 93 12/08/2014     Assessment/Plan Idiopathic thrombocytopenic purpura (HCC) Patient followed by hematology.  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  Cancer of prostate w/low recurrence risk (T1-2a, Gleason<7 & PSA<10) (HCC) Continue active surveillance.  Pure hypercholesterolemia Continue pravastatin  20 mg daily.   Camellia Door, DO  [facility] [phone]      [1]  Allergies Allergen Reactions   Cephalexin  Diarrhea    C.diff   Hydrocodone-Acetaminophen  Nausea Only    Vomiting/dizziness

## 2024-11-11 NOTE — Assessment & Plan Note (Signed)
 Chronic

## 2024-11-11 NOTE — Assessment & Plan Note (Signed)
 Continue active surveillance.

## 2024-11-11 NOTE — Assessment & Plan Note (Signed)
 Continue pravastatin  20 mg daily

## 2024-11-11 NOTE — Assessment & Plan Note (Signed)
 Continue lisinopril  20 mg daily, Norvasc  10 mg daily, propranolol  40 mg twice daily

## 2024-11-11 NOTE — Assessment & Plan Note (Signed)
Patient followed by hematology.

## 2024-12-10 ENCOUNTER — Non-Acute Institutional Stay (SKILLED_NURSING_FACILITY): Payer: Self-pay | Admitting: Orthopedic Surgery

## 2024-12-10 ENCOUNTER — Encounter: Payer: Self-pay | Admitting: Orthopedic Surgery

## 2024-12-10 DIAGNOSIS — F5101 Primary insomnia: Secondary | ICD-10-CM | POA: Diagnosis not present

## 2024-12-10 DIAGNOSIS — E7800 Pure hypercholesterolemia, unspecified: Secondary | ICD-10-CM | POA: Diagnosis not present

## 2024-12-10 DIAGNOSIS — D693 Immune thrombocytopenic purpura: Secondary | ICD-10-CM

## 2024-12-10 DIAGNOSIS — Z970 Presence of artificial eye: Secondary | ICD-10-CM

## 2024-12-10 DIAGNOSIS — I10 Essential (primary) hypertension: Secondary | ICD-10-CM | POA: Diagnosis not present

## 2024-12-10 DIAGNOSIS — R159 Full incontinence of feces: Secondary | ICD-10-CM

## 2024-12-10 DIAGNOSIS — R4189 Other symptoms and signs involving cognitive functions and awareness: Secondary | ICD-10-CM | POA: Diagnosis not present

## 2024-12-10 DIAGNOSIS — C61 Malignant neoplasm of prostate: Secondary | ICD-10-CM | POA: Diagnosis not present

## 2024-12-10 DIAGNOSIS — K219 Gastro-esophageal reflux disease without esophagitis: Secondary | ICD-10-CM

## 2024-12-10 DIAGNOSIS — R32 Unspecified urinary incontinence: Secondary | ICD-10-CM | POA: Diagnosis not present

## 2024-12-10 NOTE — Progress Notes (Signed)
 " Location:  Other [place].  Nursing Home Room Number: [facility] [id number] Place of Service:  SNF (31) Provider:  Greig Cluster, NP  PCP: [name], MD  Patient Care Team: [name], MD as PCP - General (Family Medicine) [name], MD as Consulting Physician (Oncology)  Extended Emergency Contact Information Primary Emergency Contact: Zahler,[name]  [place] Home Phone: [phone] Mobile Phone: [phone] Relation: Spouse Secondary Emergency Contact: [name]  [place] Home Phone: [phone] Relation: Son  Code Status:  DNR Goals of care: Advanced Directive information    12/10/2024    9:51 AM  Advanced Directives  Does Patient Have a Medical Advance Directive? Yes  Type of Estate Agent of [place];Out of facility DNR (pink MOST or yellow form)  Does patient want to make changes to medical advance directive? No - Patient declined  Copy of Healthcare Power of Attorney in Chart? Yes - validated most recent copy scanned in chart (See row information)     Chief Complaint  Patient presents with   Medical Management of Chronic Issues    Medical management of Chronic Issues.     HPI:  Pt is a 89 y.o. male seen today for medical management of chronic diseases.    He currently resides on the skilled nursing unit at [facility]. PMH: HTN, HLD, CAP, right retinal detachment, ARF, prostate cancer s/p TURP 2017> pathology negative for adenocarcinoma> active surveillance, ITP and unstable gait.    ITP- followed by hematology Prostate cancer- followed by [facility], s/p TURP 2017, remains under active surveillance, Promacta  and Cellcept  on hold HTN- BUN/creat 14/1.0 09/04/2024, remains on lisinopril  and propranolol  HLD- total 134, LDL 60, remains on atorvastatin Cognitive impairment- no recent MMSE, 02/2024 CT head noted stable mild diffuse atrophy and moderate chronic small vessel ischemic  changes, recent BIMS 14/15 (12/26), confused at times but easily redirected per nursing, no agitation, not on medication GERD- hgb 11.1 09/04/2024, remains on omeprazole  Insomnia- remains on melatonin Left eye globe prosthesis Urinary/bowel incontinence  No complaints today. Scheduled to see dentist later on today. Ambulates with walker. No recent falls or injuries. Working with PT/OT. No plan for discharge at this time as he still requiring skilled level of care.   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   History of ITP 03/22/2015   History of right knee joint replacement 06/11/2024   Hypercholesteremia    Hypertension    Insomnia    ITP (idiopathic thrombocytopenic purpura) 09/01/2015   Leukocytosis    Lobar pneumonia 06/03/2024   Lower urinary tract symptoms (LUTS) 10/18/2016   Nasal bleeding 01/08/2016   Prostate cancer (HCC)    Shingles    Syncope 12/21/2014   Tenosynovitis of finger 05/25/2024   Tenosynovitis of finger and hand 05/24/2024   Thrombocytosis 06/09/2024   Past Surgical History:  Procedure Laterality Date   APPENDECTOMY     artificial left eye     EYE SURGERY     FLEXOR TENDON REPAIR Right 05/24/2024   Procedure: RIGHT FIFTH FINGER TENDON FLEXOR IRRIGATION AND DEBRIDEMENT;  Surgeon: Tobie Priest,  MD;  Location: [facility] ORS;  Service: Orthopedics;  Laterality: Right;   HERNIA REPAIR     JOINT REPLACEMENT     PROSTATE SURGERY      Allergies[1]  Outpatient Encounter Medications as of 12/10/2024  Medication Sig   acetaminophen  (TYLENOL ) 500 MG tablet Take 500 mg by mouth every 6 (six) hours as needed for  mild pain. Reported on 01/24/2016   acetaminophen  (TYLENOL ) 500 MG tablet Take 1,000 mg by mouth at bedtime.   amLODipine  (NORVASC ) 10 MG tablet Take 10 mg by mouth daily.   cetaphil (CETAPHIL) lotion Apply 1 Application topically daily.   Cholecalciferol  (VITAMIN D3) 250 MCG (10000 UT) capsule Take 10,000 Units by mouth daily.   citalopram  (CELEXA ) 20 MG tablet Take 20 mg by mouth daily.   dorzolamide  (TRUSOPT ) 2 % ophthalmic solution Place 1 drop into the right eye 2 (two) times daily.   latanoprost  (XALATAN ) 0.005 % ophthalmic solution Place 1 drop into the right eye at bedtime.   lisinopril  (ZESTRIL ) 20 MG tablet Take 20 mg by mouth daily.   melatonin 5 MG TABS Take 5 mg by mouth at bedtime.   omeprazole  (PRILOSEC) 40 MG capsule TAKE 1 CAPSULE BY MOUTH DAILY USUALLY 30 MINUTES BEFORE BREAKFAST   polyethylene glycol (MIRALAX  / GLYCOLAX ) 17 g packet Take 17 g by mouth every 12 (twelve) hours as needed.   pravastatin  (PRAVACHOL ) 20 MG tablet Take 20 mg by mouth at bedtime.    propranolol  (INDERAL ) 40 MG tablet Take 1 tablet by mouth 2 (two) times daily.   senna (SENOKOT) 8.6 MG TABS tablet Take 2 tablets by mouth daily.   [Paused] eltrombopag  (PROMACTA ) 25 MG tablet TAKE 1 TABLET (25 MG TOTAL) BY MOUTH EVERY OTHER DAY. TAKE ON AN EMPTY STOMACH, 1 HOUR BEFORE A MEAL OR 2 HOURS AFTER. (Patient not taking: Reported on 12/10/2024)   guaiFENesin  (ROBITUSSIN) 100 MG/5ML liquid Take 5 mLs by mouth every 4 (four) hours as needed for cough or to loosen phlegm. (Patient not taking: Reported on 12/10/2024)   ipratropium-albuterol  (DUONEB) 0.5-2.5 (3) MG/3ML SOLN Take 3 mLs by nebulization daily. (Patient not taking: Reported on 12/10/2024)   [Paused] mycophenolate  (CELLCEPT ) 500 MG tablet Take 1 tablet (500 mg total) by mouth daily. (Patient not taking: Reported on 12/10/2024)   No facility-administered encounter medications on file as of 12/10/2024.    Review of Systems  Constitutional:  Negative for fatigue  and fever.  HENT:  Negative for dental problem, sore throat and trouble swallowing.   Eyes:  Negative for visual disturbance.  Respiratory:  Negative for cough and shortness of breath.   Cardiovascular:  Negative for chest pain and leg swelling.  Gastrointestinal:  Negative for abdominal distention and abdominal pain.  Genitourinary:  Positive for frequency. Negative for dysuria and hematuria.       Incontinence  Skin:  Negative for wound.  Neurological:  Positive for tremors and weakness. Negative for dizziness and headaches.  Psychiatric/Behavioral:  Positive for confusion and sleep disturbance. Negative for dysphoric mood. The patient is not nervous/anxious.     Immunization History  Administered Date(s) Administered   INFLUENZA, HIGH DOSE SEASONAL PF 09/05/2021   Influenza Split 08/27/2014   Influenza Whole 07/21/2016   Influenza,inj,Quad PF,6+ Mos 09/01/2015   Influenza-Unspecified 09/03/2012, 08/20/2013, 08/27/2014, 09/12/2016, 08/27/2017, 08/10/2020, 08/31/2022, 09/02/2024   PFIZER(Purple Top)SARS-COV-2 Vaccination 11/24/2019, 12/15/2019   Pfizer(Comirnaty)Fall Seasonal Vaccine 12 years and older 08/29/2023   Pneumococcal Conjugate-13 10/02/2016   Td 09/09/2021   Tdap 05/14/2013  Zoster, Live 12/12/2012   Pertinent  Health Maintenance Due  Topic Date Due   Influenza Vaccine  Completed      12/23/2021    2:51 PM 05/09/2022    2:25 PM 08/29/2022    2:00 PM 09/07/2024    5:57 PM 10/01/2024    5:28 PM  Fall Risk  Falls in the past year?    1 1  Was there an injury with Fall?    0  0   Fall Risk Category Calculator    1 1  (RETIRED) Patient Fall Risk Level Low fall risk  High fall risk  Low fall risk     Patient at Risk for Falls Due to    Impaired balance/gait Impaired balance/gait  Fall risk Follow up    Falls evaluation completed Falls evaluation completed     Data saved with a previous flowsheet row definition   Functional Status Survey:    Vitals:   12/10/24  0944  BP: 135/76  Pulse: 78  Resp: 18  Temp: (!) 97.4 F (36.3 C)  SpO2: 95%  Weight: 185 lb 4.8 oz (84.1 kg)  Height: 5' 5 (1.651 m)   Body mass index is 30.84 kg/m. Physical Exam Vitals reviewed.  Constitutional:      General: He is not in acute distress. HENT:     Head: Normocephalic.     Right Ear: There is no impacted cerumen.     Left Ear: There is no impacted cerumen.     Nose: Nose normal.     Mouth/Throat:     Mouth: Mucous membranes are moist.  Eyes:     General:        Right eye: No discharge.        Left eye: No discharge.     Comments: Left eye prosthesis  Cardiovascular:     Rate and Rhythm: Normal rate and regular rhythm.     Pulses: Normal pulses.     Heart sounds: Normal heart sounds.  Pulmonary:     Effort: Pulmonary effort is normal. No respiratory distress.     Breath sounds: Normal breath sounds. No wheezing or rales.  Abdominal:     General: Bowel sounds are normal. There is no distension.     Palpations: Abdomen is soft.     Tenderness: There is no abdominal tenderness.  Musculoskeletal:     Cervical back: Neck supple.     Right lower leg: No edema.     Left lower leg: No edema.  Skin:    General: Skin is warm.     Capillary Refill: Capillary refill takes less than 2 seconds.  Neurological:     General: No focal deficit present.     Mental Status: He is alert. Mental status is at baseline.     Gait: Gait abnormal.     Comments: Resting hand tremor  Psychiatric:        Mood and Affect: Mood normal.     Comments: Very pleasant, follows commands, alert to self/person/place     Labs reviewed: Recent Labs    06/05/24 0426 06/06/24 0426 06/07/24 0523 06/08/24 0453 06/09/24 0502 06/19/24 0000 07/14/24 0000 08/19/24 0914 09/04/24 0000  NA 133* 129*   < > 132* 132*   < > 136* 133* 134*  K 3.4* 4.1   < > 3.7 3.7   < > 4.2 3.8 4.1  CL 104 99   < > 102 98   < > 100 101 100  CO2 22 21*   < > 23 25   < > 28* 25 24*  GLUCOSE 87 92   <  > 96 106*  --   --  110*  --   BUN 17 18   < > 17 14   < > 15 16 14   CREATININE 0.71 0.63   < > 0.66 0.72   < > 0.7 0.87 1.0  CALCIUM 8.6* 8.4*   < > 8.2* 8.7*   < > 8.8 9.2 9.0  MG  --  1.9  --   --   --   --   --   --   --   PHOS 2.9  --   --   --   --   --   --   --   --    < > = values in this interval not displayed.   Recent Labs    05/25/24 0513 06/01/24 0121 07/14/24 0000 08/19/24 0914  AST 21 27 20 26   ALT 21 24 19 22   ALKPHOS 91 95 101 95  BILITOT 0.7 0.8  --  0.9  PROT 7.0 6.9  --  6.9  ALBUMIN 3.4* 2.7* 3.1* 3.5   Recent Labs    06/08/24 0453 06/09/24 0502 06/19/24 0000 07/14/24 0000 08/19/24 0913 09/04/24 0000  WBC 13.2* 13.2*   < > 7.7 8.8 10.6  NEUTROABS  --   --    < > 5,359.00 6.1 7,060.00  HGB 11.3* 11.1*   < > 10.4* 11.7* 11.1*  HCT 32.0* 32.1*   < > 32* 35.3* 34*  MCV 96.4 97.9  --   --  99.4  --   PLT 554* 616*   < > 415* 317 328   < > = values in this interval not displayed.   Lab Results  Component Value Date   TSH 0.81 07/02/2024   No results found for: HGBA1C Lab Results  Component Value Date   CHOL 131 12/08/2014   HDL 47 12/08/2014   LDLCALC 65 12/08/2014   TRIG 93 12/08/2014    Significant Diagnostic Results in last 30 days:  No results found.  Assessment/Plan 1. Chronic ITP (idiopathic thrombocytopenia) (HCC) (Primary) - followed by hematology  2. Cancer of prostate w/low recurrence risk (T1-2a, Gleason<7 & PSA<10) (HCC) - followed by urology - active surveillance  3. Essential hypertension - controlled, goal < 150/90 - cont lisinopril , amlodipine  and propranolol   4. Pure hypercholesterolemia - LDL stable - cont pravastatin   5. Cognitive impairment - recent BIMS 14/15 11/14/2024 - forgetful, needs cueing at times to complete ADLs - ambulates with walker - weight stable - not on medication  6. Gastroesophageal reflux disease without esophagitis - hgb stable with omeprazole   7. Primary insomnia - cont  melatonin  8. Eye globe prosthesis - left side  9. Bowel and bladder incontinence - cont skilled nursing     Family/ staff Communication: plan discussed with patient and nurse  Labs/tests ordered:  none        [1]  Allergies Allergen Reactions   Cephalexin  Diarrhea    C.diff   Hydrocodone-Acetaminophen  Nausea Only    Vomiting/dizziness   "

## 2024-12-19 ENCOUNTER — Other Ambulatory Visit: Payer: Self-pay

## 2024-12-19 ENCOUNTER — Inpatient Hospital Stay: Admitting: Internal Medicine

## 2024-12-19 ENCOUNTER — Inpatient Hospital Stay: Attending: Internal Medicine

## 2024-12-19 ENCOUNTER — Encounter: Payer: Self-pay | Admitting: Internal Medicine

## 2024-12-19 VITALS — BP 136/77 | HR 68 | Temp 97.6°F | Resp 18 | Ht 65.0 in | Wt 187.0 lb

## 2024-12-19 DIAGNOSIS — D693 Immune thrombocytopenic purpura: Secondary | ICD-10-CM

## 2024-12-19 LAB — CBC WITH DIFFERENTIAL (CANCER CENTER ONLY)
Abs Immature Granulocytes: 0.04 10*3/uL (ref 0.00–0.07)
Basophils Absolute: 0 10*3/uL (ref 0.0–0.1)
Basophils Relative: 0 %
Eosinophils Absolute: 0.2 10*3/uL (ref 0.0–0.5)
Eosinophils Relative: 2 %
HCT: 32.4 % — ABNORMAL LOW (ref 39.0–52.0)
Hemoglobin: 10.6 g/dL — ABNORMAL LOW (ref 13.0–17.0)
Immature Granulocytes: 0 %
Lymphocytes Relative: 16 %
Lymphs Abs: 1.6 10*3/uL (ref 0.7–4.0)
MCH: 33 pg (ref 26.0–34.0)
MCHC: 32.7 g/dL (ref 30.0–36.0)
MCV: 100.9 fL — ABNORMAL HIGH (ref 80.0–100.0)
Monocytes Absolute: 1.1 10*3/uL — ABNORMAL HIGH (ref 0.1–1.0)
Monocytes Relative: 11 %
Neutro Abs: 6.9 10*3/uL (ref 1.7–7.7)
Neutrophils Relative %: 71 %
Platelet Count: 343 10*3/uL (ref 150–400)
RBC: 3.21 MIL/uL — ABNORMAL LOW (ref 4.22–5.81)
RDW: 14.1 % (ref 11.5–15.5)
WBC Count: 9.9 10*3/uL (ref 4.0–10.5)
nRBC: 0 % (ref 0.0–0.2)

## 2024-12-19 LAB — CMP (CANCER CENTER ONLY)
ALT: 15 U/L (ref 0–44)
AST: 19 U/L (ref 15–41)
Albumin: 3.8 g/dL (ref 3.5–5.0)
Alkaline Phosphatase: 104 U/L (ref 38–126)
Anion gap: 11 (ref 5–15)
BUN: 16 mg/dL (ref 8–23)
CO2: 24 mmol/L (ref 22–32)
Calcium: 9.4 mg/dL (ref 8.9–10.3)
Chloride: 100 mmol/L (ref 98–111)
Creatinine: 0.88 mg/dL (ref 0.61–1.24)
GFR, Estimated: >60 mL/min
Glucose, Bld: 122 mg/dL — ABNORMAL HIGH (ref 70–99)
Potassium: 4.1 mmol/L (ref 3.5–5.1)
Sodium: 135 mmol/L (ref 135–145)
Total Bilirubin: 0.4 mg/dL (ref 0.0–1.2)
Total Protein: 7.1 g/dL (ref 6.5–8.1)

## 2024-12-19 LAB — RETICULOCYTES
Immature Retic Fract: 13.1 % (ref 2.3–15.9)
RBC.: 3.18 MIL/uL — ABNORMAL LOW (ref 4.22–5.81)
Retic Count, Absolute: 67.1 10*3/uL (ref 19.0–186.0)
Retic Ct Pct: 2.1 % (ref 0.4–3.1)

## 2024-12-19 LAB — IRON AND TIBC
Iron: 30 ug/dL — ABNORMAL LOW (ref 45–182)
Saturation Ratios: 12 % — ABNORMAL LOW (ref 17.9–39.5)
TIBC: 246 ug/dL — ABNORMAL LOW (ref 250–450)
UIBC: 217 ug/dL

## 2024-12-19 LAB — LACTATE DEHYDROGENASE: LDH: 163 U/L (ref 105–235)

## 2024-12-19 LAB — PSA: Prostatic Specific Antigen: 15.4 ng/mL — ABNORMAL HIGH (ref 0.00–4.00)

## 2024-12-19 LAB — FERRITIN: Ferritin: 230 ng/mL (ref 24–336)

## 2024-12-19 NOTE — Progress Notes (Signed)
 [facility] OFFICE PROGRESS NOTE  Patient Care Team: [name], MD as PCP - General (Family Medicine) [name], MD as Consulting Physician (Oncology)   SUMMARY OF ONCOLOGIC HISTORY:  #FEB 2016-  ITP  s/p Prednisone ; OCT 2016- Relapse of ITP- Prednisone ; JAN 13th- START RITUXAN  q W x4 [finished Feb 8th]; March 10th 2017- N-plate q W- good response; Dec 2018- promacta  25 mg every other day-. JULy 2025- OFF therapy-  surveillance  # Feb 2016- AUTOIMMUNE HEMOLYTIC ANEMIA; Relapsed AUG 2017- Prednisone ; NOV 27th  cellcept  500 BID; JULy 2025- OFF therapy-  surveillance  # Hx of nose bleeds  INTERVAL HISTORY: Walking independently.  Accompanied by his wife.  A very pleasant 89 -year-old male patient with a history of autoimmune hemolytic anemia/ITP on surveillance is here for follow-up.    Discussed the use of AI scribe software for clinical note transcription with the patient, who gave verbal consent to proceed.  History of Present Illness   [name] is a 89 year old male with chronic immune thrombocytopenic purpura and hemolytic anemia who presents for hematology follow-up to monitor disease stability.  He has chronic immune thrombocytopenic purpura and hemolytic anemia, previously managed with eltrombopag  and mycophenolate  mofetil, both discontinued since July 2025 following surgery and due to lack of insurance approval. Platelet counts increased significantly during hospitalization and have remained stable in the high-normal range, most recently 343 x10^9/L. Hemoglobin is 10.6 g/dL and has been in the 10 g/dL range for some time. He is aware that his hemoglobin is lower than normal and has not noticed any new symptoms.  He resides in a skilled rehabilitation facility, requiring close monitoring due to unsteadiness and incontinence. He ambulates with a walker and gait belt, participates in social activities, and spends time in communal areas.  He receives daily restorative therapy and is not wheelchair-bound. He has right knee swelling and is scheduled to see an orthopedic specialist. Facility staff report weight gain, though this is disputed by his family.  He and his family express hope that his blood counts will remain stable.       Review of Systems  Constitutional:  Negative for chills, diaphoresis, fever, malaise/fatigue and weight loss.  HENT:  Negative for nosebleeds and sore throat.   Eyes:  Negative for double vision.  Respiratory:  Negative for cough, hemoptysis, sputum production, shortness of breath and wheezing.   Cardiovascular:  Negative for chest pain, palpitations, orthopnea and leg swelling.  Gastrointestinal:  Negative for abdominal pain, blood in stool, constipation, diarrhea, heartburn, melena, nausea and vomiting.  Genitourinary:  Negative for dysuria, frequency and urgency.  Musculoskeletal:  Positive for back pain and joint pain.  Skin: Negative.  Negative for itching and rash.  Neurological:  Negative for dizziness, tingling, focal weakness, weakness and headaches.  Endo/Heme/Allergies:  Does not bruise/bleed easily.  Psychiatric/Behavioral:  Negative for depression. The patient is not nervous/anxious and does not have insomnia.     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   History of ITP 03/22/2015   History of right knee joint replacement 06/11/2024   Hypercholesteremia    Hypertension    Insomnia    ITP (idiopathic thrombocytopenic purpura) 09/01/2015   Leukocytosis    Lobar pneumonia 06/03/2024  Lower urinary tract symptoms (LUTS) 10/18/2016   Nasal bleeding 01/08/2016   Prostate cancer (HCC)    Shingles    Syncope 12/21/2014   Tenosynovitis of finger 05/25/2024    Tenosynovitis of finger and hand 05/24/2024   Thrombocytosis 06/09/2024    PAST SURGICAL HISTORY :   Past Surgical History:  Procedure Laterality Date   APPENDECTOMY     artificial left eye     EYE SURGERY     FLEXOR TENDON REPAIR Right 05/24/2024   Procedure: RIGHT FIFTH FINGER TENDON FLEXOR IRRIGATION AND DEBRIDEMENT;  Surgeon: Tobie Priest, MD;  Location: [facility] ORS;  Service: Orthopedics;  Laterality: Right;   HERNIA REPAIR     JOINT REPLACEMENT     PROSTATE SURGERY      FAMILY HISTORY :   Family History  Problem Relation Age of Onset   Cancer Mother    Multiple sclerosis Father     SOCIAL HISTORY:   Social History   Tobacco Use   Smoking status: Never   Smokeless tobacco: Never  Vaping Use   Vaping status: Never Used  Substance Use Topics   Alcohol  use: Yes    Comment: occ   Drug use: No    ALLERGIES:  is allergic to cephalexin  and hydrocodone-acetaminophen .  MEDICATIONS:  Current Outpatient Medications  Medication Sig Dispense Refill   acetaminophen  (TYLENOL ) 500 MG tablet Take 500 mg by mouth every 6 (six) hours as needed for mild pain. Reported on 01/24/2016     acetaminophen  (TYLENOL ) 500 MG tablet Take 1,000 mg by mouth at bedtime.     amLODipine  (NORVASC ) 10 MG tablet Take 10 mg by mouth daily.     cetaphil (CETAPHIL) lotion Apply 1 Application topically daily.     Cholecalciferol  (VITAMIN D3) 250 MCG (10000 UT) capsule Take 10,000 Units by mouth daily.     citalopram  (CELEXA ) 20 MG tablet Take 20 mg by mouth daily.     dorzolamide  (TRUSOPT ) 2 % ophthalmic solution Place 1 drop into the right eye 2 (two) times daily.     latanoprost  (XALATAN ) 0.005 % ophthalmic solution Place 1 drop into the right eye at bedtime.     lisinopril  (ZESTRIL ) 20 MG tablet Take 20 mg by mouth daily.     melatonin 5 MG TABS Take 5 mg by mouth at bedtime.     omeprazole  (PRILOSEC) 40 MG capsule TAKE 1 CAPSULE BY MOUTH DAILY USUALLY 30 MINUTES BEFORE BREAKFAST 90 capsule 2    polyethylene glycol (MIRALAX  / GLYCOLAX ) 17 g packet Take 17 g by mouth every 12 (twelve) hours as needed.     pravastatin  (PRAVACHOL ) 20 MG tablet Take 20 mg by mouth at bedtime.      propranolol  (INDERAL ) 40 MG tablet Take 1 tablet by mouth 2 (two) times daily.     senna (SENOKOT) 8.6 MG TABS tablet Take 2 tablets by mouth daily.     [Paused] eltrombopag  (PROMACTA ) 25 MG tablet TAKE 1 TABLET (25 MG TOTAL) BY MOUTH EVERY OTHER DAY. TAKE ON AN EMPTY STOMACH, 1 HOUR BEFORE A MEAL OR 2 HOURS AFTER. (Patient not taking: Reported on 12/19/2024) 30 tablet 4   [Paused] mycophenolate  (CELLCEPT ) 500 MG tablet Take 1 tablet (500 mg total) by mouth daily. (Patient not taking: Reported on 12/19/2024) 30 tablet 3   No current facility-administered medications for this visit.    PHYSICAL EXAMINATION:   BP 136/77 (BP Location: Left Wrist, Patient Position: Sitting, Cuff Size: Small)   Pulse 68   Temp  97.6 F (36.4 C) (Tympanic)   Resp 18   Ht 5' 5 (1.651 m)   Wt 187 lb (84.8 kg)   SpO2 98%   BMI 31.12 kg/m   Filed Weights   12/19/24 1343  Weight: 187 lb (84.8 kg)      Physical Exam HENT:     Head: Normocephalic and atraumatic.     Mouth/Throat:     Pharynx: No oropharyngeal exudate.  Eyes:     Pupils: Pupils are equal, round, and reactive to light.  Cardiovascular:     Rate and Rhythm: Normal rate and regular rhythm.     Heart sounds: Murmur heard.  Pulmonary:     Effort: No respiratory distress.     Breath sounds: No wheezing.  Abdominal:     General: Bowel sounds are normal. There is no distension.     Palpations: Abdomen is soft. There is no mass.     Tenderness: There is no abdominal tenderness. There is no guarding or rebound.  Musculoskeletal:        General: No tenderness. Normal range of motion.     Cervical back: Normal range of motion and neck supple.  Skin:    General: Skin is warm.  Neurological:     Mental Status: He is alert and oriented to person, place, and  time.  Psychiatric:        Mood and Affect: Affect normal.      LABORATORY DATA:  I have reviewed the data as listed    Component Value Date/Time   NA 135 12/19/2024 1344   NA 134 (A) 09/04/2024 0000   NA 131 (L) 03/17/2015 1425   K 4.1 12/19/2024 1344   K 3.9 03/17/2015 1425   CL 100 12/19/2024 1344   CL 101 03/17/2015 1425   CO2 24 12/19/2024 1344   CO2 26 03/17/2015 1425   GLUCOSE 122 (H) 12/19/2024 1344   GLUCOSE 161 (H) 03/17/2015 1425   BUN 16 12/19/2024 1344   BUN 14 09/04/2024 0000   BUN 17 03/17/2015 1425   CREATININE 0.88 12/19/2024 1344   CREATININE 0.90 03/17/2015 1425   CALCIUM 9.4 12/19/2024 1344   CALCIUM 8.5 (L) 03/17/2015 1425   PROT 7.1 12/19/2024 1344   PROT 6.7 03/17/2015 1425   ALBUMIN 3.8 12/19/2024 1344   ALBUMIN 3.7 03/17/2015 1425   AST 19 12/19/2024 1344   ALT 15 12/19/2024 1344   ALT 23 03/17/2015 1425   ALKPHOS 104 12/19/2024 1344   ALKPHOS 44 03/17/2015 1425   BILITOT 0.4 12/19/2024 1344   GFRNONAA >60 12/19/2024 1344   GFRNONAA >60 03/17/2015 1425   GFRAA >60 08/02/2020 1322   GFRAA >60 03/17/2015 1425    No results found for: SPEP, UPEP  Lab Results  Component Value Date   WBC 9.9 12/19/2024   NEUTROABS 6.9 12/19/2024   HGB 10.6 (L) 12/19/2024   HCT 32.4 (L) 12/19/2024   MCV 100.9 (H) 12/19/2024   PLT 343 12/19/2024      Chemistry      Component Value Date/Time   NA 135 12/19/2024 1344   NA 134 (A) 09/04/2024 0000   NA 131 (L) 03/17/2015 1425   K 4.1 12/19/2024 1344   K 3.9 03/17/2015 1425   CL 100 12/19/2024 1344   CL 101 03/17/2015 1425   CO2 24 12/19/2024 1344   CO2 26 03/17/2015 1425   BUN 16 12/19/2024 1344   BUN 14 09/04/2024 0000   BUN 17 03/17/2015 1425  CREATININE 0.88 12/19/2024 1344   CREATININE 0.90 03/17/2015 1425   GLU 78 09/04/2024 0000      Component Value Date/Time   CALCIUM 9.4 12/19/2024 1344   CALCIUM 8.5 (L) 03/17/2015 1425   ALKPHOS 104 12/19/2024 1344   ALKPHOS 44 03/17/2015 1425    AST 19 12/19/2024 1344   ALT 15 12/19/2024 1344   ALT 23 03/17/2015 1425   BILITOT 0.4 12/19/2024 1344       ASSESSMENT & PLAN:   Idiopathic thrombocytopenic purpura (HCC) #Chronic ITP/Hemolytic anemia  -  JULY -AUG 2025 [hospital] currently OFF sec to costs/ stability of his counts.   # hemoglobin -10.5- plat late- 300+  monitor for now.  Continue monitoring OFF therapy. Will check Iron studies/ferritin;   # Dementia- slow progressive-/[[facility]] defer to PCP.   # Prostatism: Elevated PSA [[facility]; Urology]- rising- 24- ? Hormone shot- no further follow up with Urology. Will monitor locally-  stable.   # DISPOSITION: #  Iron studies/ferritin- please add to labs today # follow up in 4 months/labs-cbc/cmp/LDH; haptoglobin; reticulocyte count; PSA;  Iron studies/ferritin; Erythropoietin-b12- Dr.B       Sylas Twombly R Edyth Glomb, MD 12/19/2024 2:59 PM

## 2024-12-19 NOTE — Progress Notes (Signed)
 Patient is doing well, no new questions for the doctor today.

## 2024-12-19 NOTE — Assessment & Plan Note (Addendum)
#  Chronic ITP/Hemolytic anemia  -  JULY -AUG 2025 [hospital] currently OFF sec to costs/ stability of his counts.   # hemoglobin -10.5- plat late- 300+  monitor for now.  Continue monitoring OFF therapy. Will check Iron studies/ferritin;   # Dementia- slow progressive-/[[place]] defer to PCP.   # Prostatism: Elevated PSA [[facility]; Urology]- rising- 24- ? Hormone shot- no further follow up with Urology. Will monitor locally-  stable.   # DISPOSITION: #  Iron studies/ferritin- please add to labs today # follow up in 4 months/labs-cbc/cmp/LDH; haptoglobin; reticulocyte count; PSA;  Iron studies/ferritin; Erythropoietin-b12- Dr.B

## 2024-12-20 LAB — HAPTOGLOBIN: Haptoglobin: 380 mg/dL — ABNORMAL HIGH (ref 38–329)

## 2024-12-23 ENCOUNTER — Telehealth: Payer: Self-pay | Admitting: Internal Medicine

## 2025-04-20 ENCOUNTER — Inpatient Hospital Stay: Admitting: Internal Medicine

## 2025-04-20 ENCOUNTER — Inpatient Hospital Stay
